# Patient Record
Sex: Female | Born: 1945
Health system: Southern US, Community
[De-identification: ages and names within clinical notes are randomized; demographics above are authoritative.]

## PROBLEM LIST (undated history)

## (undated) DIAGNOSIS — T7840XA Allergy, unspecified, initial encounter: Secondary | ICD-10-CM

## (undated) DIAGNOSIS — G8929 Other chronic pain: Secondary | ICD-10-CM

## (undated) DIAGNOSIS — I428 Other cardiomyopathies: Secondary | ICD-10-CM

## (undated) DIAGNOSIS — G894 Chronic pain syndrome: Secondary | ICD-10-CM

## (undated) DIAGNOSIS — N6320 Unspecified lump in the left breast, unspecified quadrant: Secondary | ICD-10-CM

## (undated) DIAGNOSIS — I5042 Chronic combined systolic (congestive) and diastolic (congestive) heart failure: Secondary | ICD-10-CM

## (undated) DIAGNOSIS — E785 Hyperlipidemia, unspecified: Secondary | ICD-10-CM

## (undated) DIAGNOSIS — R7303 Prediabetes: Secondary | ICD-10-CM

## (undated) DIAGNOSIS — I4819 Other persistent atrial fibrillation: Secondary | ICD-10-CM

## (undated) DIAGNOSIS — M1990 Unspecified osteoarthritis, unspecified site: Secondary | ICD-10-CM

## (undated) DIAGNOSIS — K068 Other specified disorders of gingiva and edentulous alveolar ridge: Secondary | ICD-10-CM

## (undated) DIAGNOSIS — M25551 Pain in right hip: Secondary | ICD-10-CM

## (undated) DIAGNOSIS — I110 Hypertensive heart disease with heart failure: Secondary | ICD-10-CM

## (undated) DIAGNOSIS — K219 Gastro-esophageal reflux disease without esophagitis: Secondary | ICD-10-CM

## (undated) DIAGNOSIS — I10 Essential (primary) hypertension: Secondary | ICD-10-CM

## (undated) DIAGNOSIS — M109 Gout, unspecified: Secondary | ICD-10-CM

## (undated) DIAGNOSIS — B370 Candidal stomatitis: Secondary | ICD-10-CM

## (undated) DIAGNOSIS — Z720 Tobacco use: Secondary | ICD-10-CM

## (undated) DIAGNOSIS — F329 Major depressive disorder, single episode, unspecified: Secondary | ICD-10-CM

## (undated) DIAGNOSIS — Z8739 Personal history of other diseases of the musculoskeletal system and connective tissue: Secondary | ICD-10-CM

## (undated) DIAGNOSIS — M7662 Achilles tendinitis, left leg: Secondary | ICD-10-CM

## (undated) DIAGNOSIS — L301 Dyshidrosis [pompholyx]: Secondary | ICD-10-CM

## (undated) DIAGNOSIS — E559 Vitamin D deficiency, unspecified: Secondary | ICD-10-CM

## (undated) DIAGNOSIS — R432 Parageusia: Secondary | ICD-10-CM

## (undated) DIAGNOSIS — J984 Other disorders of lung: Secondary | ICD-10-CM

## (undated) DIAGNOSIS — M25512 Pain in left shoulder: Secondary | ICD-10-CM

## (undated) HISTORY — DX: Unspecified lump in the left breast, unspecified quadrant: N63.20

## (undated) HISTORY — DX: Pain in right hip: M25.551

## (undated) HISTORY — DX: Other disorders of lung: J98.4

## (undated) HISTORY — DX: Gout, unspecified: M10.9

## (undated) HISTORY — PX: ROTATOR CUFF REPAIR: SHX139

## (undated) HISTORY — DX: Unspecified osteoarthritis, unspecified site: M19.90

## (undated) HISTORY — DX: Hyperlipidemia, unspecified: E78.5

## (undated) HISTORY — DX: Chronic pain syndrome: G89.4

## (undated) HISTORY — DX: Essential (primary) hypertension: I10

## (undated) HISTORY — DX: Allergy, unspecified, initial encounter: T78.40XA

## (undated) HISTORY — PX: HIP FUSION: SHX669

## (undated) HISTORY — DX: Gastro-esophageal reflux disease without esophagitis: K21.9

## (undated) HISTORY — PX: TUBAL LIGATION: SHX77

## (undated) HISTORY — DX: Personal history of other diseases of the musculoskeletal system and connective tissue: Z87.39

## (undated) HISTORY — DX: Other cardiomyopathies: I42.8

## (undated) HISTORY — DX: Other specified disorders of gingiva and edentulous alveolar ridge: K06.8

## (undated) HISTORY — DX: Parageusia: R43.2

## (undated) HISTORY — DX: Other chronic pain: G89.29

## (undated) HISTORY — DX: Hypertensive heart disease with heart failure: I11.0

## (undated) HISTORY — PX: COLONOSCOPY: SHX174

## (undated) HISTORY — DX: Dyshidrosis (pompholyx): L30.1

## (undated) HISTORY — PX: JOINT REPLACEMENT: SHX530

## (undated) HISTORY — DX: Vitamin D deficiency, unspecified: E55.9

## (undated) HISTORY — DX: Chronic combined systolic (congestive) and diastolic (congestive) heart failure: I50.42

## (undated) HISTORY — DX: Achilles tendinitis, left leg: M76.62

## (undated) HISTORY — DX: Tobacco use: Z72.0

## (undated) HISTORY — DX: Major depressive disorder, single episode, unspecified: F32.9

## (undated) HISTORY — DX: Pain in left shoulder: M25.512

## (undated) HISTORY — DX: Candidal stomatitis: B37.0

---

## 1985-10-31 HISTORY — PX: TOTAL HIP ARTHROPLASTY: SHX124

## 1998-04-02 ENCOUNTER — Other Ambulatory Visit: Admission: RE | Admit: 1998-04-02 | Discharge: 1998-04-02 | Payer: Self-pay | Admitting: Family Medicine

## 1998-06-02 ENCOUNTER — Other Ambulatory Visit: Admission: RE | Admit: 1998-06-02 | Discharge: 1998-06-02 | Payer: Self-pay | Admitting: Family Medicine

## 1999-02-10 ENCOUNTER — Encounter: Payer: Self-pay | Admitting: Family Medicine

## 1999-02-10 ENCOUNTER — Ambulatory Visit (HOSPITAL_COMMUNITY): Admission: RE | Admit: 1999-02-10 | Discharge: 1999-02-10 | Payer: Self-pay | Admitting: Family Medicine

## 1999-03-03 ENCOUNTER — Ambulatory Visit (HOSPITAL_COMMUNITY): Admission: RE | Admit: 1999-03-03 | Discharge: 1999-03-03 | Payer: Self-pay | Admitting: Family Medicine

## 1999-12-21 ENCOUNTER — Other Ambulatory Visit: Admission: RE | Admit: 1999-12-21 | Discharge: 1999-12-21 | Payer: Self-pay | Admitting: Family Medicine

## 2000-12-01 ENCOUNTER — Other Ambulatory Visit: Admission: RE | Admit: 2000-12-01 | Discharge: 2000-12-01 | Payer: Self-pay | Admitting: Family Medicine

## 2000-12-08 ENCOUNTER — Encounter: Payer: Self-pay | Admitting: Orthopedic Surgery

## 2000-12-12 ENCOUNTER — Observation Stay (HOSPITAL_COMMUNITY): Admission: RE | Admit: 2000-12-12 | Discharge: 2000-12-13 | Payer: Self-pay | Admitting: Orthopedic Surgery

## 2000-12-12 ENCOUNTER — Encounter: Payer: Self-pay | Admitting: Orthopedic Surgery

## 2003-06-17 ENCOUNTER — Encounter: Admission: RE | Admit: 2003-06-17 | Discharge: 2003-06-17 | Payer: Self-pay | Admitting: Internal Medicine

## 2003-07-30 ENCOUNTER — Encounter: Admission: RE | Admit: 2003-07-30 | Discharge: 2003-07-30 | Payer: Self-pay | Admitting: Internal Medicine

## 2003-11-01 HISTORY — PX: CARDIAC CATHETERIZATION: SHX172

## 2003-12-03 ENCOUNTER — Inpatient Hospital Stay (HOSPITAL_COMMUNITY): Admission: EM | Admit: 2003-12-03 | Discharge: 2003-12-05 | Payer: Self-pay | Admitting: Emergency Medicine

## 2004-03-29 ENCOUNTER — Ambulatory Visit (HOSPITAL_COMMUNITY): Admission: RE | Admit: 2004-03-29 | Discharge: 2004-03-29 | Payer: Self-pay | Admitting: Internal Medicine

## 2004-07-02 ENCOUNTER — Encounter: Admission: RE | Admit: 2004-07-02 | Discharge: 2004-08-17 | Payer: Self-pay | Admitting: Internal Medicine

## 2004-07-07 ENCOUNTER — Ambulatory Visit (HOSPITAL_COMMUNITY): Admission: RE | Admit: 2004-07-07 | Discharge: 2004-07-07 | Payer: Self-pay | Admitting: Family Medicine

## 2004-07-19 ENCOUNTER — Ambulatory Visit: Payer: Self-pay | Admitting: Nurse Practitioner

## 2004-08-06 ENCOUNTER — Ambulatory Visit: Payer: Self-pay | Admitting: *Deleted

## 2004-08-18 ENCOUNTER — Ambulatory Visit: Payer: Self-pay | Admitting: Nurse Practitioner

## 2004-11-29 ENCOUNTER — Ambulatory Visit: Payer: Self-pay | Admitting: Nurse Practitioner

## 2005-07-11 ENCOUNTER — Ambulatory Visit: Payer: Self-pay | Admitting: Nurse Practitioner

## 2006-01-25 ENCOUNTER — Ambulatory Visit: Payer: Self-pay | Admitting: Nurse Practitioner

## 2006-05-31 ENCOUNTER — Ambulatory Visit: Payer: Self-pay | Admitting: Nurse Practitioner

## 2006-07-05 ENCOUNTER — Ambulatory Visit: Payer: Self-pay | Admitting: Nurse Practitioner

## 2006-09-15 ENCOUNTER — Ambulatory Visit: Payer: Self-pay | Admitting: Nurse Practitioner

## 2006-11-10 ENCOUNTER — Ambulatory Visit: Payer: Self-pay | Admitting: Nurse Practitioner

## 2006-11-25 ENCOUNTER — Encounter: Admission: RE | Admit: 2006-11-25 | Discharge: 2006-11-25 | Payer: Self-pay | Admitting: Family Medicine

## 2007-01-12 ENCOUNTER — Encounter: Admission: RE | Admit: 2007-01-12 | Discharge: 2007-01-12 | Payer: Self-pay | Admitting: Orthopedic Surgery

## 2007-02-02 ENCOUNTER — Encounter: Admission: RE | Admit: 2007-02-02 | Discharge: 2007-02-20 | Payer: Self-pay | Admitting: Orthopedic Surgery

## 2007-07-04 ENCOUNTER — Encounter (INDEPENDENT_AMBULATORY_CARE_PROVIDER_SITE_OTHER): Payer: Self-pay | Admitting: Nurse Practitioner

## 2007-07-04 ENCOUNTER — Ambulatory Visit: Payer: Self-pay | Admitting: Family Medicine

## 2007-07-04 LAB — CONVERTED CEMR LAB
ALT: 24 units/L (ref 0–35)
Albumin: 4.9 g/dL (ref 3.5–5.2)
Basophils Absolute: 0 10*3/uL (ref 0.0–0.1)
CO2: 22 meq/L (ref 19–32)
Chloride: 102 meq/L (ref 96–112)
Cholesterol: 280 mg/dL — ABNORMAL HIGH (ref 0–200)
Glucose, Bld: 131 mg/dL — ABNORMAL HIGH (ref 70–99)
HCT: 45.4 % (ref 36.0–46.0)
LDL Cholesterol: 189 mg/dL — ABNORMAL HIGH (ref 0–99)
Lymphocytes Relative: 47 % — ABNORMAL HIGH (ref 12–46)
Lymphs Abs: 2.3 10*3/uL (ref 0.7–3.3)
Neutro Abs: 2.1 10*3/uL (ref 1.7–7.7)
Neutrophils Relative %: 43 % (ref 43–77)
Platelets: 328 10*3/uL (ref 150–400)
Potassium: 4 meq/L (ref 3.5–5.3)
RDW: 14.2 % — ABNORMAL HIGH (ref 11.5–14.0)
Sodium: 139 meq/L (ref 135–145)
Total Bilirubin: 0.4 mg/dL (ref 0.3–1.2)
Total Protein: 8.6 g/dL — ABNORMAL HIGH (ref 6.0–8.3)
Triglycerides: 225 mg/dL — ABNORMAL HIGH (ref <150)
VLDL: 45 mg/dL — ABNORMAL HIGH (ref 0–40)
WBC: 5 10*3/uL (ref 4.0–10.5)

## 2007-07-18 ENCOUNTER — Encounter (INDEPENDENT_AMBULATORY_CARE_PROVIDER_SITE_OTHER): Payer: Self-pay | Admitting: *Deleted

## 2007-07-26 ENCOUNTER — Ambulatory Visit: Payer: Self-pay | Admitting: Internal Medicine

## 2007-07-27 ENCOUNTER — Ambulatory Visit (HOSPITAL_COMMUNITY): Admission: RE | Admit: 2007-07-27 | Discharge: 2007-07-27 | Payer: Self-pay | Admitting: Internal Medicine

## 2007-12-20 ENCOUNTER — Ambulatory Visit: Payer: Self-pay | Admitting: Family Medicine

## 2008-01-10 ENCOUNTER — Encounter (INDEPENDENT_AMBULATORY_CARE_PROVIDER_SITE_OTHER): Payer: Self-pay | Admitting: Nurse Practitioner

## 2008-01-10 ENCOUNTER — Ambulatory Visit: Payer: Self-pay | Admitting: Internal Medicine

## 2008-01-10 LAB — CONVERTED CEMR LAB
AST: 36 units/L (ref 0–37)
Albumin: 4.8 g/dL (ref 3.5–5.2)
Alkaline Phosphatase: 149 units/L — ABNORMAL HIGH (ref 39–117)
BUN: 16 mg/dL (ref 6–23)
Calcium: 9.4 mg/dL (ref 8.4–10.5)
Chloride: 103 meq/L (ref 96–112)
Glucose, Bld: 149 mg/dL — ABNORMAL HIGH (ref 70–99)
LDL Cholesterol: 90 mg/dL (ref 0–99)
Potassium: 4.4 meq/L (ref 3.5–5.3)
Sodium: 140 meq/L (ref 135–145)
Total Protein: 8.3 g/dL (ref 6.0–8.3)

## 2008-03-05 ENCOUNTER — Ambulatory Visit: Payer: Self-pay | Admitting: Internal Medicine

## 2008-04-15 ENCOUNTER — Ambulatory Visit: Payer: Self-pay | Admitting: Family Medicine

## 2008-07-25 ENCOUNTER — Ambulatory Visit: Payer: Self-pay | Admitting: Internal Medicine

## 2008-07-25 ENCOUNTER — Emergency Department (HOSPITAL_COMMUNITY): Admission: EM | Admit: 2008-07-25 | Discharge: 2008-07-25 | Payer: Self-pay | Admitting: Emergency Medicine

## 2009-01-05 ENCOUNTER — Ambulatory Visit: Payer: Self-pay | Admitting: Internal Medicine

## 2009-01-05 ENCOUNTER — Encounter (INDEPENDENT_AMBULATORY_CARE_PROVIDER_SITE_OTHER): Payer: Self-pay | Admitting: Internal Medicine

## 2009-01-05 LAB — CONVERTED CEMR LAB
ALT: 13 U/L
AST: 15 U/L
Albumin: 4.6 g/dL
Alkaline Phosphatase: 141 U/L — ABNORMAL HIGH
BUN: 19 mg/dL
CO2: 19 meq/L
Calcium: 8.9 mg/dL
Chloride: 103 meq/L
Cholesterol: 174 mg/dL
Creatinine, Ser: 0.94 mg/dL
Glucose, Bld: 133 mg/dL — ABNORMAL HIGH
HDL: 49 mg/dL
LDL Cholesterol: 102 mg/dL — ABNORMAL HIGH
Potassium: 4 meq/L
Sodium: 140 meq/L
Total Bilirubin: 0.4 mg/dL
Total CHOL/HDL Ratio: 3.6
Total Protein: 8 g/dL
Triglycerides: 117 mg/dL
VLDL: 23 mg/dL

## 2009-02-10 ENCOUNTER — Ambulatory Visit: Payer: Self-pay | Admitting: Internal Medicine

## 2009-02-16 ENCOUNTER — Ambulatory Visit (HOSPITAL_COMMUNITY): Admission: RE | Admit: 2009-02-16 | Discharge: 2009-02-16 | Payer: Self-pay | Admitting: Obstetrics & Gynecology

## 2010-01-26 ENCOUNTER — Ambulatory Visit: Payer: Self-pay | Admitting: Internal Medicine

## 2010-01-29 ENCOUNTER — Ambulatory Visit: Payer: Self-pay | Admitting: Internal Medicine

## 2010-01-29 LAB — CONVERTED CEMR LAB
AST: 23 units/L (ref 0–37)
Albumin: 4.5 g/dL (ref 3.5–5.2)
BUN: 15 mg/dL (ref 6–23)
CO2: 21 meq/L (ref 19–32)
CRP: 1 mg/dL — ABNORMAL HIGH (ref <0.6)
Calcium: 8.9 mg/dL (ref 8.4–10.5)
Chloride: 97 meq/L (ref 96–112)
Glucose, Bld: 146 mg/dL — ABNORMAL HIGH (ref 70–99)
Hgb A1c MFr Bld: 7.2 % — ABNORMAL HIGH (ref 4.6–6.1)
Potassium: 3.4 meq/L — ABNORMAL LOW (ref 3.5–5.3)
Uric Acid, Serum: 8.8 mg/dL — ABNORMAL HIGH (ref 2.4–7.0)

## 2010-02-26 ENCOUNTER — Ambulatory Visit: Payer: Self-pay | Admitting: Internal Medicine

## 2010-02-26 ENCOUNTER — Encounter (INDEPENDENT_AMBULATORY_CARE_PROVIDER_SITE_OTHER): Payer: Self-pay | Admitting: Internal Medicine

## 2010-03-12 ENCOUNTER — Ambulatory Visit (HOSPITAL_COMMUNITY): Admission: RE | Admit: 2010-03-12 | Discharge: 2010-03-12 | Payer: Self-pay | Admitting: Internal Medicine

## 2010-04-22 ENCOUNTER — Ambulatory Visit: Payer: Self-pay | Admitting: Internal Medicine

## 2010-04-22 LAB — CONVERTED CEMR LAB
BUN: 13 mg/dL (ref 6–23)
Calcium: 9.2 mg/dL (ref 8.4–10.5)
Chloride: 98 meq/L (ref 96–112)
Creatinine, Ser: 0.75 mg/dL (ref 0.40–1.20)
Hgb A1c MFr Bld: 6.8 % — ABNORMAL HIGH (ref <5.7)

## 2010-11-21 ENCOUNTER — Encounter: Payer: Self-pay | Admitting: Family Medicine

## 2011-03-18 NOTE — Discharge Summary (Signed)
NAMEMARLEE, [name]:  [id number]   MEDICAL RECORD NO.:  [id number]                   PATIENT TYPE:  INP   LOCATION:  2036                                 FACILITY:  [facility]   PHYSICIAN:  Melissa L. Ladona Ridgel, MD               DATE OF BIRTH:  1946-02-11   DATE OF ADMISSION:  12/03/2003  DATE OF DISCHARGE:  12/05/2003                                 DISCHARGE SUMMARY   ADMISSION DIAGNOSES:  1. Chest pain.  2. Hypertension.  3. Hypercholesterolemia.   DISCHARGE DIAGNOSIS:  Noncardiac chest pain.   MEDICATIONS AT THE TIME OF DISCHARGE:  1. Prevacid 30 mg once daily.  2. Procardia XL 90 mg once daily.  3. Aspirin 81 mg once daily.  4. Zocor 20 mg once daily.  5. Hydrochlorothiazide 12.5 mg once daily.   PAIN MANAGEMENT:  She was to resume her home medication, the name of which  was not known at the time of admission.  We will give her a limited supply  of Darvocet-N 100.   DIET:  Low-cholesterol, low-salt.   SPECIAL INSTRUCTIONS:  Remove the dressing when showering in the a.m. and no  tub baths x4-5 days.  If you have fever, chills, increased redness or  bleeding of the cath site go to the emergency room and hold pressure over  the site.   FOLLOW UP:  Make an appointment in one week to have her cath site checked  with her primary care physician.   HISTORY OF PRESENT ILLNESS:  The patient is a 65 year old African-American  female with risk factors for coronary artery disease namely; hypertension,  hypercholesterolemia and cigarette smoking.  She also has a positive family  history for coronary artery disease.  The patient presented to the emergency  room with an episode of chest discomfort described as tightness involving  her thenar area and radiating into her feet which was associated with some  shortness of breath, without any nausea, vomiting or diaphoresis.  She has  recently had a mild cough.  The patient was admitted to the telemetry  floor  for rule out of myocardial infarction and cardiac enzymes.  Her EKGs were  unremarkable.  Her enzymes were negative.  She was continued on her aspirin,  Procardia, hydrochlorothiazide and Zocor and tobacco cessation counseling  was offered.  Her chronic back pain was treated with Darvocet.  On December 03, 2003 [facility] was consulted and on December 04, 2003 the patient  went for a Cardiolite stress test.  The patient reacted to her stress  testing with vague complaint of weight on her chest, dyspnea and tightness  that all recovered over the course of two hours.  There was a question of  ischemia in the anterior wall and an ejection fraction of 56% was noted.  There patient was therefore scheduled for a cardiac catheterization and on  December 05, 2003 she underwent said procedure.  Her coronary arteries were  found  to be normal with normal left ventricular function and an EF of 55%  was noted.  The plan was for no further cardiac workup.  She was discharged  to home on her previous medications, instructed on tobacco cessation and was  to followup with her primary care physician in one week to look at her groin  site.   PHYSICAL EXAMINATION:  VITAL SIGNS: On the day of discharge, the patient's  physical exam revealed vital signs were stable.  GENERALLY: She was in no acute distress.  HEENT: Pupils were equal, round and reactive to light.  Extraocular muscles  were intact.  Mucous membranes were moist.  CHEST: Clear to auscultation.  CARDIOVASCULAR: Regular rate and rhythm.  Positive S1 and S2.  No murmurs,  rubs or gallops.  ABDOMEN: Soft and nontender, nondistended with positive bowel sounds.  EXTREMITIES: Showed no edema.   Over the course of the hospitalization the patient required potassium  repletion.  At the time of discharge she was instructed on potassium rich  foods and she was continued on her hydrochlorothiazide, Procardia, aspirin,  and Zocor.  I note the  patient's condition on discharge is stable.                                                Melissa L. Ladona Ridgel, MD    MLT/MEDQ  D:  02/03/2004  T:  02/05/2004  Job:  [id number]   cc:   Rollene Rotunda, M.D.   Denise Score (?), M.D.  [facility]

## 2011-03-18 NOTE — Cardiovascular Report (Signed)
NAMEAMELITA, Donna [name]:  [id number]   MEDICAL RECORD NO.:  [id number]                   PATIENT TYPE:  INP   LOCATION:  2036                                 FACILITY:  [facility]   PHYSICIAN:  Rollene Rotunda, M.D.                DATE OF BIRTH:  21-Nov-1945   DATE OF PROCEDURE:  12/05/2003  DATE OF DISCHARGE:  12/05/2003                              CARDIAC CATHETERIZATION   PRIMARY CARE PHYSICIAN:  Sherin Quarry, MD.   PROCEDURE:  Left heart catheterization, coronary arteriography.   INDICATION:  Evaluate patient with an abnormal Cardiolite suggesting apical  ischemia.  She also had substernal chest discomfort.   PROCEDURE NOTE:  Left heart catheterization was performed via the right  femoral artery.  The artery was cannulated using an anterior wall puncture.  A 6 French arterial sheath was inserted via the modified Seldinger  technique.  Preformed Judkins and a pigtail catheter were utilized.  The  patient tolerated the procedure well and left the lab in stable condition.  Her groin was closed using Angio-Seal device.   RESULTS:   HEMODYNAMICS:  1. LV 119/19.  2. Aortic 119/60.   CORONARIES:  The left main was short and normal.  The LAD was normal.  The  circumflex was normal and gave rise to ramus intermediate and two large  marginals.  The right coronary artery was dominant and normal.   LEFT VENTRICULOGRAM:  Left ventriculogram was obtained in the RAO  projection.  The EF was 55% with normal wall motion.   CONCLUSION:  1. Normal coronaries.  2. Normal left ventricular function.   PLAN:  No further cardiovascular testing is suggested.                                               Rollene Rotunda, M.D.    Derinda Sis  D:  12/05/2003  T:  12/06/2003  Job:  [id number]

## 2011-03-18 NOTE — Consult Note (Signed)
Donna Herrera, GERBINO NO.:  [id number]   MEDICAL RECORD NO.:  [id number]                   PATIENT TYPE:  INP   LOCATION:  2923                                 FACILITY:  [facility]   PHYSICIAN:  Rollene Rotunda, M.D.                DATE OF BIRTH:  12/19/1945   DATE OF CONSULTATION:  12/03/2003  DATE OF DISCHARGE:                                   CONSULTATION   REFERRING PHYSICIAN:  Melissa L. Ladona Ridgel, M.D.   REASON FOR CONSULTATION:  The patient has chest pain.   HISTORY OF PRESENT ILLNESS:  The patient is a 65 year old, African-American  female with no prior cardiac history.  She has had a history of chest  discomfort for over one year.  She says that it has been increasing in  frequency.  Yesterday she had two episodes of chest discomfort, one in the  morning and one in the evening.  They were both at rest.  They lasted for a  few minutes.  She described a cramping feeling with some throat discomfort.  It was not like her previous reflux.  This was moderate in intensity.  There  was no associated nausea, vomiting, or diaphoresis.  There were no  palpitations, presyncope or syncope.  She called [facility] and was told to  come to the emergency room.  She has had no recurrence of chest pain since  that time.   The patient is somewhat limited by orthopaedic problems, but she does get  around in her house doing her housework.  With this she cannot bring on any  chest discomfort and has no exertional symptoms.  She has no shortness of  breath, denies PND or orthopnea.   PAST MEDICAL HISTORY:  1. Hypertension x20 years.  2. Hyperlipidemia x5 years.   PAST SURGICAL HISTORY:  1. 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  2. Rotator cuff surgery.  3. Bilateral knee replacements.  4. Right and left hip surgeries.   ALLERGIES:  MORPHINE.   MEDICATIONS:  1. Hydrochlorothiazide.  2. Prevacid 30 mg q.d.  3. Procardia XL  90 mg q.d.  4. Aspirin.  5. Zocor 20 mg q.h.s., arthritis.   SOCIAL HISTORY:  The patient lives in [place] with her husband.  She has  smoked one half pack a day for 40 years.  She occasionally drinks beer.   FAMILY HISTORY:  Contributory for a mother dying of myocardial infarction at  68.   REVIEW OF SYMPTOMS:  As stated in the history of present illness; positive  for headache, nasal discharge, depression, joint pains, cough, edema, and  reflux.   PHYSICAL EXAMINATION:  GENERAL:  The patient is in no distress.  BLOOD PRESSURE:  102/52.  HEART RATE:  64 and regular.  TEMPERATURE:  Afebrile.  HEENT:  Eyes unremarkable.  Pupils equal, round and reactive to light.  Fundi not visualized.  Oral mucosa unremarkable.  NECK:  No jugular venous distension.  Waveform  within normal limits.  Carotid upstroke brisk and symmetric without bruits or thyromegaly.  Lymphatics without cervical, axillary or inguinal adenopathy.  LUNGS:  Clear to auscultation bilaterally.  BACK:  No costovertebral tenderness.  CHEST:  Unremarkable.  HEART:  PMI is displaced or sustained.  S1 and S2 within normal limits.  No  S3, no S4 and no murmurs.  ABDOMEN:  Obese, positive bowel sounds, normal in frequency, pitch, bruits,  rebound, guarding, no midline pulses, no masses, and no organomegaly.  SKIN:  No rash. __________  EXTREMITIES:  2+ pulses throughout, obvious right versus left leg length  discrepancy.  No edema, no cyanosis, and no clubbing.  No calf tenderness or  swelling.  NEUROLOGIC:  Oriented to person, place and time.  Cranial nerves II-XII  grossly intact.  Motor grossly intact.   Chest x-ray; no acute disease.   EKG; sinus rhythm, rate 69, axis within normal limits, intervals within  normal limits, no acute ST-T wave changes.   LABORATORY DATA:  WBC's 6.2, hemoglobin 13.2, sodium 139, potassium 3.1, BUN  13, creatinine 1, CK 270, MB 7.6, troponin 0.01.   ASSESSMENT AND PLAN:  1. Chest  pain.  Chest discomfort is mostly atypical in nature.  She does     have hypertension and tobacco as a cardiovascular risk factor with family     history as well.  There is no objective evidence of ischemia.  I think     the __________ probability is somewhat low.  I think that a rest/stress     perfusion study would be the safest way to proceed with evaluation.  I     have discussed this with the patient and her family and they agree to     proceed.  2. Tobacco.  We discussed stop smoking.  She is having a stop smoking     consult.  3. Hypertension.  Her blood pressure is currently well controlled and she     will continue medications as listed.  4. Follow-up.  This will be based on the results of the stress test.                                               Rollene Rotunda, M.D.    JH/MEDQ  D:  12/03/2003  T:  12/03/2003  Job:  [id number]   cc:   Melissa L. Ladona Ridgel, MD  [address] [place], [place] [address]  Fax: [phone]

## 2011-03-18 NOTE — H&P (Signed)
NAME:  Donna Herrera, Donna [name]:  [id number]   MEDICAL RECORD NO.:  [id number]                   PATIENT TYPE:  EMS   LOCATION:  MAJO                                 FACILITY:  [facility]   PHYSICIAN:  Jackie Plum, M.D.             DATE OF BIRTH:  Jun 24, 1946   DATE OF ADMISSION:  12/02/2003  DATE OF DISCHARGE:                                HISTORY & PHYSICAL   CHIEF COMPLAINT:  Chest pain.   HISTORY OF PRESENT ILLNESS:  The patient is a 65 year old African American  lady with a history of hypertension and dyslipidemia, who presented today  with one year history of intermittent chest pain.  She came into today  because of an episode of chest pain this evening.  The pain was described as  more of a tightness involving her thenar area which radiates to her foot.  It was associated with some shortness of breath, without any nausea,  vomiting, diaphoresis, fever or chills.  She gives history of mild cough  without any sputum production.  She does not have any palpitations,  nocturnal dyspnea, visual changes, abdominal pain, nausea, vomiting, dysuria  or frequency of micturition.  Pain is said to be worsened by exertion.  It  is relieved by rest.  At the time of the evaluation, the patient's chest  pain had resolved and she was unable to really grade the pain.   PAST MEDICAL HISTORY:  Notable for:  1. Hypertension.  2. Hypercholesterolemia.   MEDICATIONS:  Zocor, Procardia, hydrochlorothiazide, colchicine, Prevacid.   ALLERGIES:  MORPHINE SULFATE.   SOCIAL HISTORY:  She drinks alcohol occasionally but does not use any elicit  drugs.  She smokes cigarettes.   FAMILY HISTORY:  Positive for heart disease.   REVIEW OF SYSTEMS:  No other significant positives or negatives.  See HPI  above; otherwise, unremarkable.   PHYSICAL EXAMINATION:  VITAL SIGNS:  Blood pressure 127/77, pulse 78,  respiratory rate 20, temperature 98.8.  Saturations 99% on room air.  GENERAL:  An obese African American lady lying on a stretcher.  She was not  in acute cardiopulmonary distress.  HEENT:  Normocephalic, atraumatic.  Pupils equal, round and reactive to  light.  Extraocular movements were intact.  She does not have any  conjunctival pallor or icterus.  Oropharynx was moist.  NECK:  Supple, no JVD, no carotid bruits appreciated.  LUNGS:  Clear to auscultation.  CARDIAC:  No rubs or murmurs appreciated.  Heart regular rate and rhythm.  ABDOMEN:  Obese, nontender, bowel sounds were present and normoactive.  EXTREMITIES:  She had 1+ bipedal dependent edema.  NEUROLOGIC:  She is alert and oriented x3.  No acute focality.   STUDIES:  X-ray-no cardiomegaly, no acute infiltrate.   LABORATORY DATA:  CBC was essentially within normal limits.  Initial set of  cardiac enzymes was unremarkable.  Chemistries-sodium 139, potassium 3.1,  chloride 103, SGOT 39, glucose 99, BUN 13, creatinine 1, calcium 9.1, total  protein 7.4, albumin 3.9, AST 24, ALT 21, alkaline phosphatase 25, bilirubin  0.3.   ASSESSMENT:  A 64 year old African American lady with coronary artery  disease, risk factors of age, hypertension, hypercholesterolemia, and  possible family history, who presents with chest pain, cigarette smoking and  probable family history, presented with recurrent intermittent chest pains  over the last one year.  Pain onset during rest and aggravated by activity  along with rest.  Currently, the patient is chest pain-free at the ER at the  moment.  The patient's initial cardiac enzymes and EKGs are unremarkable.   PLAN:  Plan of care is to admit the patient for observation, rule out  protocol be started and the patient will be NPO after midnight today for  possible stress test if she rules out in view of her significant risk  factors.                                                Jackie Plum, M.D.    GO/MEDQ  D:  12/02/2003  T:  12/03/2003  Job:  [id number]

## 2011-03-18 NOTE — Op Note (Signed)
[facility]  Patient:    Donna Herrera, Donna Herrera                    MRN: [id number] Proc. Date: 12/12/00 Attending:  Fayrene Fearing P. Aplington, M.D.                           Operative Report  PREOPERATIVE DIAGNOSIS:   Painful nail plate, left hip.  POSTOPERATIVE DIAGNOSIS:  Painful nail plate, left hip.  OPERATION:  Removal of nail plate, two washers, let hip.  SURGEON:  Illene Labrador. Aplington, M.D.  ASSISTANT:  Dorie Rank, P.A.  ANESTHESIA:  General.  INDICATIONS:  She had this apparatus which I thought was a Smith-Petersen  but turned out to be a Zimmer when we got in the hip.  As a teenager, she had a hip fusion.  The junction of the plate and nail was very prominent digging into the soft tissues causing her considerable pain.  DESCRIPTION OF PROCEDURE:  After satisfactory general anesthesia, in the right decubitus position on the Mark II frame, the left hip was prepped with DuraPrep and draped in the sterile field.  Collier Flowers was employed.  One of the old surgical scars which I used for the surgical incision was excised.  There were two other scars which were not involved.  Incision was carried down to the prominent nail plate junction and once I demarcated that I then began chipping away bone from around it and going distally along the plate.  There were two washers which were loose, and one was near the nail plate junction.  This was easily found and removed.  There were originally four screws in the plate. The proximal three had heads still on them, and the fourth one was headless with a washer at this location as well.  I was not able to initially find the washer.  I used the C-arm to locate it in the soft tissues and then removed it.  I then fortunately was able to remove the cruciate screws from the three that had heads.  I did not try and damage the femur any further to remove the fourth one since it was not causing any problems, and I felt that  the treatment would be potentially worse than the problem.  I was able to leave off the plate which left the prominent Smith-petersen type nail.  We were unable to find screwdriver to fit in the head and consequently I began chipping away the nail with osteotome and rongeur until it was loose enough that by placing vise-grip pliers on the end of the screw, I was able to drive the nail out retrograde.  I then placed whatever bone chips we had remaining from the surgical approach into the canal.  The wound was then irrigated with sterile saline.  She was given 30 mg of Toradol IV, and soft tissues were irrigated with 0.5% plain Marcaine.  The wound was then closed in the deep layers with interrupted #1 Vicryl, superficial with 2-0 Vicryl and the skin with staples.  Dry sterile dressing was applied.  She was rolled over off the operating bed and taken to the recovery room in satisfactory condition with no known complications. DD:  12/12/00 TD:  12/12/00 Job: [id number]

## 2011-03-31 ENCOUNTER — Ambulatory Visit: Payer: Medicare Other

## 2011-04-04 ENCOUNTER — Ambulatory Visit: Payer: Medicare Other | Attending: Family Medicine | Admitting: Rehabilitative and Restorative Service Providers"

## 2011-04-04 DIAGNOSIS — IMO0001 Reserved for inherently not codable concepts without codable children: Secondary | ICD-10-CM | POA: Insufficient documentation

## 2011-04-04 DIAGNOSIS — R293 Abnormal posture: Secondary | ICD-10-CM | POA: Insufficient documentation

## 2011-04-04 DIAGNOSIS — M6281 Muscle weakness (generalized): Secondary | ICD-10-CM | POA: Insufficient documentation

## 2011-04-04 DIAGNOSIS — M25619 Stiffness of unspecified shoulder, not elsewhere classified: Secondary | ICD-10-CM | POA: Insufficient documentation

## 2011-04-19 ENCOUNTER — Encounter: Payer: Medicare Other | Admitting: Rehabilitative and Restorative Service Providers"

## 2011-05-02 ENCOUNTER — Ambulatory Visit: Payer: Medicare Other | Admitting: Rehabilitative and Restorative Service Providers"

## 2012-10-12 ENCOUNTER — Ambulatory Visit (INDEPENDENT_AMBULATORY_CARE_PROVIDER_SITE_OTHER): Payer: Medicare Other | Admitting: Family Medicine

## 2012-10-12 ENCOUNTER — Encounter: Payer: Self-pay | Admitting: Family Medicine

## 2012-10-12 VITALS — BP 123/73 | HR 72 | Temp 98.0°F | Ht 67.0 in | Wt 173.1 lb

## 2012-10-12 DIAGNOSIS — M1990 Unspecified osteoarthritis, unspecified site: Secondary | ICD-10-CM | POA: Insufficient documentation

## 2012-10-12 DIAGNOSIS — I10 Essential (primary) hypertension: Secondary | ICD-10-CM

## 2012-10-12 DIAGNOSIS — M109 Gout, unspecified: Secondary | ICD-10-CM

## 2012-10-12 DIAGNOSIS — I110 Hypertensive heart disease with heart failure: Secondary | ICD-10-CM | POA: Insufficient documentation

## 2012-10-12 DIAGNOSIS — G8929 Other chronic pain: Secondary | ICD-10-CM | POA: Insufficient documentation

## 2012-10-12 DIAGNOSIS — I509 Heart failure, unspecified: Secondary | ICD-10-CM | POA: Insufficient documentation

## 2012-10-12 DIAGNOSIS — K219 Gastro-esophageal reflux disease without esophagitis: Secondary | ICD-10-CM | POA: Insufficient documentation

## 2012-10-12 DIAGNOSIS — J309 Allergic rhinitis, unspecified: Secondary | ICD-10-CM | POA: Insufficient documentation

## 2012-10-12 HISTORY — DX: Unspecified osteoarthritis, unspecified site: M19.90

## 2012-10-12 HISTORY — DX: Hypertensive heart disease with heart failure: I11.0

## 2012-10-12 LAB — CBC
HCT: 40.5 % (ref 36.0–46.0)
MCV: 85.3 fL (ref 78.0–100.0)
RBC: 4.75 MIL/uL (ref 3.87–5.11)
WBC: 4.5 10*3/uL (ref 4.0–10.5)

## 2012-10-12 LAB — COMPREHENSIVE METABOLIC PANEL
ALT: 18 U/L (ref 0–35)
CO2: 26 mEq/L (ref 19–32)
Creat: 0.62 mg/dL (ref 0.50–1.10)
Glucose, Bld: 107 mg/dL — ABNORMAL HIGH (ref 70–99)
Total Bilirubin: 0.4 mg/dL (ref 0.3–1.2)

## 2012-10-12 LAB — POCT GLYCOSYLATED HEMOGLOBIN (HGB A1C): Hemoglobin A1C: 6.3

## 2012-10-12 LAB — URIC ACID: Uric Acid, Serum: 8.6 mg/dL — ABNORMAL HIGH (ref 2.4–7.0)

## 2012-10-12 MED ORDER — METOPROLOL SUCCINATE ER 50 MG PO TB24: 50.0000 mg | ORAL_TABLET | Freq: Every day | ORAL | Status: DC

## 2012-10-12 MED ORDER — COLCHICINE 0.6 MG PO TABS: 0.6000 mg | ORAL_TABLET | Freq: Every day | ORAL | Status: DC

## 2012-10-12 MED ORDER — AMLODIPINE BESYLATE 5 MG PO TABS: 5.0000 mg | ORAL_TABLET | Freq: Every day | ORAL | Status: DC

## 2012-10-12 MED ORDER — HYDROCODONE-ACETAMINOPHEN 5-325 MG PO TABS: 1.0000 | ORAL_TABLET | Freq: Four times a day (QID) | ORAL | Status: DC | PRN

## 2012-10-12 NOTE — Assessment & Plan Note (Signed)
Acute attack is improving with colchicine. Discussed prophylactic therapy but she states that last flare was 10 yrs ago so this might not be indicated. Will check uric acid today. Refill colchicine. Might be able to take NSAIDs pending renal function and records (to rule out PUD). F/u in 3-4 weeks.

## 2012-10-12 NOTE — Patient Instructions (Addendum)
Nice to meet you. Would recommend continuing colchicine for now. Stop the hydrochlorothiazide-this can worsen gout. Start taking metoprolol for BP instead. If kidney function is good and you don't have stomach problems like ulcers, then motrin/advil or aleve can help. Please make appointment for check up in one month to check your pain and blood pressure again. Avoid alcohol, red meats, extra protein which can trigger gout.   Gout Gout is caused by a buildup of uric acid crystals in the joints. The crystals make your joints sore. This is like having sand in your joints. Repeat attacks are common. Gout can be treated. HOME CARE   Do not take aspirin for pain.  Only take medicine as told by your doctor.  You may use cold treatments (ice) on painful joints.  Put ice in a plastic bag.  Place a towel between your skin and the bag.  Leave the ice on for 15 to 20 minutes at a time, 3 to 4 times a day.  Rest in bed as much as possible. When in bed, keep the sheets and blankets off your sore joints.  Keep the sore joints raised (elevated).  Use crutches if your legs or ankles hurt.  Drink enough water and fluids to keep your pee (urine) clear or pale yellow. This helps your body get rid of uric acid. Do not drink alcohol.  Follow diet instructions as told by your doctor.  Keep your body at a healthy weight. GET HELP RIGHT AWAY IF:   You have a temperature by mouth above 102 F (38.9 C), not controlled by medicine.  You have watery poop (diarrhea).  You are throwing up (vomiting).  You do not feel better in 1 day, or you are getting worse.  Your joint hurts more.  You have the chills. MAKE SURE YOU:   Understand these instructions.  Will watch your condition.  Will get help right away if you are not doing well or get worse. Document Released: 07/26/2008 Document Revised: 01/09/2012 Document Reviewed: 01/25/2010 [facility] Patient Information 2013 [place], [place].

## 2012-10-12 NOTE — Assessment & Plan Note (Signed)
At goal today. We discussed HCTZ as possible risk for recurrent gout. Will discontinue this and start metoprolol XL 50 mg to continue with nifedipine. F/u in 3-4 weeks. Check labs and renal function today.

## 2012-10-12 NOTE — Progress Notes (Signed)
Subjective:    Patient ID: Donna Herrera, female    DOB: August 15, 1946, 66 y.o.   MRN: [id number]  HPI New patient to establish care from [facility].  1. Chronic pain/DJD. Has extensive hx of ortho surgeries including right hip replacement, bilateral knee replacements, bilateral rotator cuff repair and left hip fusion from reported SCFE as a younger adult. She has done PT before but not helpful. She is relatively stable from pain perspective, but "just wants to get through the holidays." previously was taking celebrex but cannot afford. Uses vicodin currently. Denies falls, swelling, redness, numbness.  2. Right toe pain. Previously dx with gout, last attack was about 10-15 yrs ago. Current pain started 10 days ago and improving. She is taking colchicine which she had from previous flare (rx has 05/2012 on it). She asks what other medications can be used that are cheaper. She denies hx of renal issues or PUD (but is prescribed carafate). Does not endorse recent ETOH, meat binge.  3. HTN. Taking HCTZ and nifedipine for years. Denies edema, chest pain, dyspnea, med side effects.   Past Medical History  Diagnosis Date  . Allergy   . Arthritis   . Hypertension   . GERD (gastroesophageal reflux disease)   . Chronic pain   . Hyperlipidemia   . Gout   . H/O slipped capital femoral epiphysis (SCFE)    Past Surgical History  Procedure Date  . Cesarean section   . Tubal ligation   . Total hip arthroplasty 1987    right. Dr. Leslee Home  . Joint replacement 1991, 1997    [place]  . Hip fusion     left   . Rotator cuff repair     bilateral  . Cardiac catheterization 2005    normal-Hochrein   Family History  Problem Relation Age of Onset  . Depression Mother   . Diabetes Mother   . Hypertension Mother   . Stroke Mother   . Heart disease Mother   . Cancer Father     unknown primary  . Diabetes Sister    History   Social History  . Marital Status: Married    Spouse Name: N/A     Number of Children: N/A  . Years of Education: N/A   Occupational History  . Not on file.   Social History Main Topics  . Smoking status: Current Every Day Smoker -- 0.5 packs/day  . Smokeless tobacco: Not on file  . Alcohol Use: Yes     Comment: on holidays  . Drug Use: No  . Sexually Active: Not on file   Other Topics Concern  . Not on file   Social History Narrative   Formerly daycare provider. Lives with husband, daughter Donna Herrera and grandkids.    Review of Systems See HPI otherwise negative.  reports that she has been smoking.  She does not have any smokeless tobacco history on file.     Objective:   Physical Exam  Vitals reviewed. Constitutional: She is oriented to person, place, and time. She appears well-developed and well-nourished. No distress.       Appears older than stated.  HENT:  Head: Normocephalic and atraumatic.  Nose: Nose normal.  Mouth/Throat: Oropharynx is clear and moist. No oropharyngeal exudate.  Eyes: EOM are normal. Pupils are equal, round, and reactive to light.  Neck: Neck supple. No thyromegaly present.  Cardiovascular: Normal rate, regular rhythm and normal heart sounds.   No murmur heard. Pulmonary/Chest: Effort normal and breath sounds normal.  No respiratory distress. She has no wheezes. She has no rales.  Abdominal: Soft.  Musculoskeletal: She exhibits no edema and no tenderness.       Right large toe some TTP in DIP joint. ROM intact. No warmth or edema. No spinous TTP. Left shoulder active and passive ROM limited to 90 degree of abduction. Major crepitus.  Right shoulder ROM intact.  Lymphadenopathy:    She has no cervical adenopathy.  Neurological: She is alert and oriented to person, place, and time. No cranial nerve deficit. Coordination normal.       Gait somewhat antalgic, with a cane.  Skin: No rash noted. She is not diaphoretic.  Psychiatric: She has a normal mood and affect.       Assessment & Plan:

## 2012-10-12 NOTE — Assessment & Plan Note (Addendum)
Severe chronic pain and limited functioning. Discussed pain medication options. NSAID might be a good option, but she does not give a good history for reason she takes carafate (would like to avoid if she actually has PUD). Refuses refill for celebrex due to expense. Will attempt to get records from [facility] (she states did not pick up packet prior to them closing). Have refilled short term vicodin for now. F/u in 3-4 weeks.

## 2012-10-15 ENCOUNTER — Other Ambulatory Visit: Payer: Self-pay | Admitting: Family Medicine

## 2012-10-15 DIAGNOSIS — E559 Vitamin D deficiency, unspecified: Secondary | ICD-10-CM

## 2012-10-15 MED ORDER — POTASSIUM CHLORIDE 20 MEQ PO PACK: 20.0000 meq | PACK | Freq: Every day | ORAL | Status: DC

## 2012-10-15 MED ORDER — ERGOCALCIFEROL 1.25 MG (50000 UT) PO CAPS: 50000.0000 [IU] | ORAL_CAPSULE | ORAL | Status: DC

## 2012-10-16 ENCOUNTER — Telehealth: Payer: Self-pay | Admitting: Family Medicine

## 2012-10-16 NOTE — Telephone Encounter (Signed)
Advised pt of Dr Ernest Haber rec [id number] and the need for labs in 1 mo

## 2012-11-02 ENCOUNTER — Encounter: Payer: Self-pay | Admitting: Family Medicine

## 2012-11-02 ENCOUNTER — Ambulatory Visit (HOSPITAL_COMMUNITY)
Admission: RE | Admit: 2012-11-02 | Discharge: 2012-11-02 | Disposition: A | Discharge status: Home / Self Care | Payer: Medicare Other | Source: Ambulatory Visit | Attending: Family Medicine | Admitting: Family Medicine

## 2012-11-02 ENCOUNTER — Ambulatory Visit (INDEPENDENT_AMBULATORY_CARE_PROVIDER_SITE_OTHER): Payer: Medicare Other | Admitting: Family Medicine

## 2012-11-02 VITALS — BP 120/74 | HR 89 | Temp 98.1°F | Ht 67.0 in | Wt 174.3 lb

## 2012-11-02 DIAGNOSIS — M25551 Pain in right hip: Secondary | ICD-10-CM | POA: Insufficient documentation

## 2012-11-02 DIAGNOSIS — M25559 Pain in unspecified hip: Secondary | ICD-10-CM

## 2012-11-02 HISTORY — DX: Pain in right hip: M25.551

## 2012-11-02 MED ORDER — HYDROCODONE-ACETAMINOPHEN 5-300 MG PO TABS: 1.0000 | ORAL_TABLET | Freq: Three times a day (TID) | ORAL | Status: DC | PRN

## 2012-11-02 NOTE — Patient Instructions (Addendum)
Your hip may have some damage from arthritis or previous surgery. Get xrays done. Will make orthopedics referral. If you dont hear about appointment in next week, call clinic to remind [name]. Caution with vicodin can cause sleepiness. Make appointment for check up in 2 weeks.

## 2012-11-02 NOTE — Assessment & Plan Note (Addendum)
Acute worsening. No red flags or injury/fall reported. Suspect some degeneration of artifical joint is recurring. Plain film reviewed shows no acute changes but stable degeneration, spurring and stable possible subluxation. Will make orthopedic referral for further evaluation. Refill vicodin for short term. Advised to f/u for worsening or if trouble walking, make appt in 2 weeks.

## 2012-11-02 NOTE — Progress Notes (Signed)
  Subjective:    Patient ID: Donna Herrera, female    DOB: Aug 03, 1946, 67 y.o.   MRN: [id number]  HPI  1. Right hip pain. Worsened past 3 weeks. In the anterior and posterior aspect. No triggering factor known. Had hip arthroplasty by Dr. Leslee Home in the 1980s. In 2009, had similar worsening of symptoms and had some debris in the joint on film. She did not f/u and hasn't seen orthopedist in years. Ran out of vicodin she normally took for pain (previously rx at [facility]).   Review of Systems Denies any falls, other joint exacerbations, fever, calf/knee pain, worsened back pain, leg numbness, incontinence.      Objective:   Physical Exam  Vitals reviewed. Constitutional: She is oriented to person, place, and time. She appears well-developed and well-nourished. No distress.  HENT:  Head: Normocephalic and atraumatic.  Pulmonary/Chest: Effort normal.  Musculoskeletal:       Stands with cane. Gait is antalgic. Right hip has TTP in anterior aspect and in SI region. Very slow for active ROM and resists any passive ROM.  No skin changes or oozing noted.  Neurological: She is alert and oriented to person, place, and time.  Skin: No rash noted.          Assessment & Plan:

## 2012-11-12 ENCOUNTER — Encounter: Payer: Self-pay | Admitting: Family Medicine

## 2012-11-21 ENCOUNTER — Ambulatory Visit: Payer: Medicare Other | Admitting: Family Medicine

## 2012-11-28 ENCOUNTER — Ambulatory Visit: Payer: Medicare Other | Admitting: Family Medicine

## 2013-01-02 ENCOUNTER — Encounter: Payer: Self-pay | Admitting: Family Medicine

## 2013-01-02 ENCOUNTER — Ambulatory Visit (INDEPENDENT_AMBULATORY_CARE_PROVIDER_SITE_OTHER): Payer: Medicare Other | Admitting: Family Medicine

## 2013-01-02 VITALS — BP 102/75 | HR 77 | Temp 98.6°F | Ht 67.0 in | Wt 179.0 lb

## 2013-01-02 DIAGNOSIS — R51 Headache: Secondary | ICD-10-CM

## 2013-01-02 DIAGNOSIS — R519 Headache, unspecified: Secondary | ICD-10-CM | POA: Insufficient documentation

## 2013-01-02 DIAGNOSIS — M25551 Pain in right hip: Secondary | ICD-10-CM

## 2013-01-02 DIAGNOSIS — M25559 Pain in unspecified hip: Secondary | ICD-10-CM

## 2013-01-02 DIAGNOSIS — M1990 Unspecified osteoarthritis, unspecified site: Secondary | ICD-10-CM

## 2013-01-02 DIAGNOSIS — I10 Essential (primary) hypertension: Secondary | ICD-10-CM

## 2013-01-02 LAB — COMPREHENSIVE METABOLIC PANEL
Alkaline Phosphatase: 104 U/L (ref 39–117)
BUN: 15 mg/dL (ref 6–23)
Chloride: 104 mEq/L (ref 96–112)
Creat: 0.82 mg/dL (ref 0.50–1.10)
Glucose, Bld: 124 mg/dL — ABNORMAL HIGH (ref 70–99)
Potassium: 3.6 mEq/L (ref 3.5–5.3)
Total Protein: 7.3 g/dL (ref 6.0–8.3)

## 2013-01-02 LAB — CBC
MCH: 28.2 pg (ref 26.0–34.0)
MCHC: 32.9 g/dL (ref 30.0–36.0)
MCV: 85.6 fL (ref 78.0–100.0)
Platelets: 280 10*3/uL (ref 150–400)
RDW: 14.2 % (ref 11.5–15.5)
WBC: 5.5 10*3/uL (ref 4.0–10.5)

## 2013-01-02 MED ORDER — DESONIDE 0.05 % EX CREA: 1.0000 "application " | TOPICAL_CREAM | Freq: Every day | CUTANEOUS | Status: DC | PRN

## 2013-01-02 MED ORDER — HYDROCODONE-ACETAMINOPHEN 5-300 MG PO TABS: 1.0000 | ORAL_TABLET | Freq: Every day | ORAL | Status: DC | PRN

## 2013-01-02 MED ORDER — AMOXICILLIN 500 MG PO CAPS: 500.0000 mg | ORAL_CAPSULE | Freq: Three times a day (TID) | ORAL | Status: DC

## 2013-01-02 NOTE — Assessment & Plan Note (Signed)
Low today. Will discontinue HCTZ and she is not taking nifedipine, will continue on without this as well. F/u in 1-2 weeks for recheck. Labs today.

## 2013-01-02 NOTE — Patient Instructions (Addendum)
Stop taking hydrochlorothiazide and nifedipine. Keep taking metoprolol 50 mg daily. Pick up refills of vitamin D.  Pick up amoxicillin and start using nasal saline spray (buy over the counter). Make appointment in one week or sooner if needed.

## 2013-01-02 NOTE — Progress Notes (Signed)
  Subjective:    Patient ID: Donna Herrera, female    DOB: 12-01-1945, 67 y.o.   MRN: [id number]  Hypertension    1. HTN. Patient confused on instruction for meds last visit. She is still taking HCTZ and stopped the nifedipine. Did start metoprolol. Does not check at home. States she had few days of lightheadedness last week. Now c/o headache recently. Her gout symptoms are resolved.  Denies syncope, edema, abdominal pain, visual decline, falls.  2. Headache. States about 2 weeks ago she had start of runny nose, congestion. Currently still having some right throat irritation, right facial congestion and right sided frontal headache that comes and goes. She also has some right neck tenderness.  Denies fever, chills, fatigue, rash, visual changes, weight loss, dyspnea, cough.  3. Chronic pain/OA. Currently her hip is feeling better after some resting, she notices some right shoulder pain/stiffness that is longstanding. She was not able to f/u with ortho because of outstanding bill. Denies any worsening, rash, falls, injury. She ran out of medication and is using OTC aleve and tylenol but is not helping. She requests refill of hydrocodone.  Hx of hip surgery many years ago.  Review of Systems See HPI otherwise negative.  reports that she has been smoking.  She does not have any smokeless tobacco history on file.     Objective:   Physical Exam  Vitals reviewed. Constitutional: She is oriented to person, place, and time. She appears well-developed and well-nourished. No distress.  HENT:  Head: Normocephalic and atraumatic.  Mouth/Throat: Oropharynx is clear and moist.  Right maxillary, temporal and frontal TTP. No rash. Right paraspinal musculature is TTP. Slight pharyngeal erythema, no exudate.  No pain with jaw ROM or in TMJ  Eyes: EOM are normal. Pupils are equal, round, and reactive to light.  Neck: Normal range of motion. Neck supple. No thyromegaly present.  Cardiovascular:  Normal rate, regular rhythm and normal heart sounds.   Pulmonary/Chest: Effort normal and breath sounds normal. No respiratory distress. She has no wheezes.  Lymphadenopathy:    She has no cervical adenopathy.  Neurological: She is alert and oriented to person, place, and time.  Skin: She is not diaphoretic.  Psychiatric: She has a normal mood and affect.       Assessment & Plan:

## 2013-01-02 NOTE — Assessment & Plan Note (Signed)
Most likely this comes after a viral URI with some post nasal drip and sinusitis to explain the headache and sore throat. No systemic signs or visual symptoms to suggest more serious such as temporal arteritis, but this crossed my mind since she does have some associated temporal tenderness. Will check inflammatory markers to rule out. Treat for sinusitis with nasal saline and amoxicillin. F/u in one week.

## 2013-01-02 NOTE — Assessment & Plan Note (Signed)
Chronic arthritis and associated pain. Would prefer if she not take scheduled narcotics. Ok to use prn vicodin which i will send to pharmacy as needed. Will have her sign medication contract next visit. F/u in 1-2 weeks.

## 2013-01-03 ENCOUNTER — Other Ambulatory Visit: Payer: Self-pay | Admitting: Family Medicine

## 2013-01-03 ENCOUNTER — Telehealth: Payer: Self-pay | Admitting: *Deleted

## 2013-01-03 NOTE — Telephone Encounter (Signed)
Message copied by Farrell Ours on Thu Jan 03, 2013  8:44 AM ------      Message from: Durwin Reges      Created: Wed Jan 02, 2013  4:52 PM       Please call in the hydrocodone. I didn't print during her visit today. ------

## 2013-01-03 NOTE — Telephone Encounter (Signed)
Called in hydrocodone to [facility] [address]. Spoke with patient and notified her of this

## 2013-01-13 ENCOUNTER — Other Ambulatory Visit: Payer: Self-pay | Admitting: Family Medicine

## 2013-02-06 ENCOUNTER — Ambulatory Visit (INDEPENDENT_AMBULATORY_CARE_PROVIDER_SITE_OTHER): Payer: Medicare Other | Admitting: Family Medicine

## 2013-02-06 ENCOUNTER — Encounter: Payer: Self-pay | Admitting: Family Medicine

## 2013-02-06 VITALS — BP 134/72 | HR 68 | Temp 98.0°F | Ht 67.0 in | Wt 180.0 lb

## 2013-02-06 DIAGNOSIS — G8929 Other chronic pain: Secondary | ICD-10-CM

## 2013-02-06 DIAGNOSIS — R51 Headache: Secondary | ICD-10-CM

## 2013-02-06 DIAGNOSIS — M25551 Pain in right hip: Secondary | ICD-10-CM

## 2013-02-06 DIAGNOSIS — M25559 Pain in unspecified hip: Secondary | ICD-10-CM

## 2013-02-06 MED ORDER — HYDROCODONE-ACETAMINOPHEN 5-300 MG PO TABS: 1.0000 | ORAL_TABLET | Freq: Every day | ORAL | Status: DC | PRN

## 2013-02-06 NOTE — Progress Notes (Signed)
  Subjective:    Patient ID: Donna Herrera, female    DOB: [birth year], 67 y.o.   MRN: [id number]  Hip Pain     1. F/u sinusitis/headaches. Completed treatment with antibiotic one month ago. Her rhinorrhea and nasal discharge has resolved. She still intermittently feels some pain near her left eye and generalized headaches. This seems to have worsened over the past several weeks. She currently takes Aleve on a daily basis and she ran out of Vicodin for her hip arthritis. She also takes aspirin and Tylenol at times. The headache does feel throbbing. No current headache. This happens several times weekly. Does resolve with analgesic.  There is no loss of vision, fever, chills, rash, swelling, weakness, weight loss, night sweats, sinus pain, cough, ear pain, hearing loss. Patient has gone without eye exam for approximately 5-10 years. Last seen by Dr. Harriette Bouillon in [place].  2. Chronic hip/shoulder pain. Patient ran out of Vicodin one to 2 weeks ago. Experiencing worsened chronic right hip pain. Is using multiple doses of OTC analgesics. Previously she was prescribed Vicodin for 90 pills to last for 2-3 months while at [facility].  The patient states she cannot afford a hip replacement, therefore has not followed up with her orthopedist. She also has a history of bilateral rotator cuff repairs. These are giving her chronic pain for several years, this is not new. There is no extra stiffness noted.  Plain films of the hip showed stable post right arthroplasty changes. Additionally her inflammatory markers were negative.  Review of Systems See HPI otherwise negative.  reports that she has been smoking.  She does not have any smokeless tobacco history on file.     Objective:   Physical Exam  Vitals reviewed. Constitutional: She is oriented to person, place, and time. She appears well-developed and well-nourished. No distress.  HENT:  Head: Normocephalic and atraumatic.  Right Ear: External  ear normal.  Left Ear: External ear normal.  Nose: Nose normal.  Mouth/Throat: Oropharynx is clear and moist. No oropharyngeal exudate.  Poor dentition. A right maxillary molar appears with darkened gingival rim and chipped appearance. Additionally, there are several other similar teeth with significant decay. There is no erythema, abscess, pus or malodor noted.  No tempporal tenderness to palpation. No sinus tenderness to palpation. No abnormality on fundic exam.  Eyes: Conjunctivae and EOM are normal. Pupils are equal, round, and reactive to light.  Neck: Neck supple. No thyromegaly present.  Cardiovascular: Normal rate, regular rhythm and normal heart sounds.   No murmur heard. Pulmonary/Chest: Effort normal. No respiratory distress.  Abdominal: Soft.  Musculoskeletal: She exhibits edema.  Weight tenderness to palpation in bilateral posterior shoulders. Active range of motion is intact.  Gait is antalgic with a cane.  Lymphadenopathy:    She has no cervical adenopathy.  Neurological: She is alert and oriented to person, place, and time.  Skin: No rash noted. She is not diaphoretic.  Psychiatric: She has a normal mood and affect.       Assessment & Plan:

## 2013-02-06 NOTE — Patient Instructions (Signed)
Your facial pain maybe from tooth impaction. No sign of sinus infection. See if your insurance can cover a dentist. Avoid taking aleve/motrin everyday because that can make headache worse. We need to get you into an eye doctor for check up, since headache is near eye. Make an appointment in 1-2 months for check up.

## 2013-02-06 NOTE — Assessment & Plan Note (Addendum)
Chronic hip and shoulder pain, no plans for definitive hip or placement surgery at this time do to financial barriers. Does not seem to be consistent with PMR or other inflammatory disease given her history of multiple surgeries, chronicity, and negative inflammatory markers. Patient is unable to followup with Dr. Leslee Herrera. I have refilled her Vicodin to use when necessary daily. Advised she avoid long-term OTC NSAIDs. F/u in 1-2 months.

## 2013-02-06 NOTE — Assessment & Plan Note (Signed)
This persists after a treatment for sinus infection, however no sign of infection is present today. Most likely this represents medication overuse headache with her frequent analgesic use. Could also be rebound migraine. There are no red flags on exam today. She is overdue for ophthalmologic exam, so I recommend she has this done to rule out any optic conditions also. Her inflammatory markers were negative and has no systemic signs of inflammatory making this less likely. Advised patient to cut back on NSAIDs and OTC analgesia.

## 2013-02-12 ENCOUNTER — Other Ambulatory Visit: Payer: Self-pay | Admitting: Family Medicine

## 2013-02-22 ENCOUNTER — Other Ambulatory Visit: Payer: Self-pay | Admitting: *Deleted

## 2013-02-22 MED ORDER — ERGOCALCIFEROL 1.25 MG (50000 UT) PO CAPS: 50000.0000 [IU] | ORAL_CAPSULE | ORAL | Status: DC

## 2013-04-10 ENCOUNTER — Encounter: Payer: Self-pay | Admitting: Family Medicine

## 2013-04-10 ENCOUNTER — Ambulatory Visit (INDEPENDENT_AMBULATORY_CARE_PROVIDER_SITE_OTHER): Payer: Medicare Other | Admitting: Family Medicine

## 2013-04-10 ENCOUNTER — Other Ambulatory Visit: Payer: Self-pay | Admitting: *Deleted

## 2013-04-10 VITALS — BP 158/84

## 2013-04-10 DIAGNOSIS — M25512 Pain in left shoulder: Secondary | ICD-10-CM

## 2013-04-10 DIAGNOSIS — I10 Essential (primary) hypertension: Secondary | ICD-10-CM

## 2013-04-10 DIAGNOSIS — M25551 Pain in right hip: Secondary | ICD-10-CM

## 2013-04-10 DIAGNOSIS — G8929 Other chronic pain: Secondary | ICD-10-CM

## 2013-04-10 DIAGNOSIS — M25519 Pain in unspecified shoulder: Secondary | ICD-10-CM

## 2013-04-10 DIAGNOSIS — K219 Gastro-esophageal reflux disease without esophagitis: Secondary | ICD-10-CM

## 2013-04-10 DIAGNOSIS — M25559 Pain in unspecified hip: Secondary | ICD-10-CM

## 2013-04-10 HISTORY — DX: Other chronic pain: G89.29

## 2013-04-10 MED ORDER — PANTOPRAZOLE SODIUM 40 MG PO TBEC: 40.0000 mg | DELAYED_RELEASE_TABLET | Freq: Every day | ORAL | Status: DC

## 2013-04-10 MED ORDER — HYDROCODONE-ACETAMINOPHEN 5-300 MG PO TABS: 1.0000 | ORAL_TABLET | Freq: Every day | ORAL | Status: DC | PRN

## 2013-04-10 MED ORDER — COLCHICINE 0.6 MG PO TABS: 0.6000 mg | ORAL_TABLET | Freq: Every day | ORAL | Status: DC | PRN

## 2013-04-10 NOTE — Assessment & Plan Note (Signed)
Slightly elevated today which is atypical. Her dose is slightly late due to appt today. Will not make adjustment. F/u in 1-2 months.

## 2013-04-10 NOTE — Patient Instructions (Addendum)
You may have some rotator cuff problems. Important to do your shoulder exercises every day. You could call orthopedist again for more options, or if you want physical therapy then let us know.  You can call [facility] for your colonoscopy.  If your acid reflux gets worse, then let us know. We may need to do more testing. Make an appointment in 2 months for check up.   Shoulder Exercises EXERCISES  RANGE OF MOTION (ROM) AND STRETCHING EXERCISES These exercises may help you when beginning to rehabilitate your injury. Your symptoms may resolve with or without further involvement from your physician, physical therapist or athletic trainer. While completing these exercises, remember:   Restoring tissue flexibility helps normal motion to return to the joints. This allows healthier, less painful movement and activity.  An effective stretch should be held for at least 30 seconds.  A stretch should never be painful. You should only feel a gentle lengthening or release in the stretched tissue.   ROM - Pendulum  Bend at the waist so that your right / left arm falls away from your body. Support yourself with your opposite hand on a solid surface, such as a table or a countertop.  Your right / left arm should be perpendicular to the ground. If it is not perpendicular, you need to lean over farther. Relax the muscles in your right / left arm and shoulder as much as possible.  Gently sway your hips and trunk so they move your right / left arm without any use of your right / left shoulder muscles.  Progress your movements so that your right / left arm moves side to side, then forward and backward, and finally, both clockwise and counterclockwise.  Complete __________ repetitions in each direction. Many people use this exercise to relieve discomfort in their shoulder as well as to gain range of motion. Repeat __________ times. Complete this exercise __________ times per day. STRETCH  Flexion,  Standing  Stand with good posture. With an underhand grip on your right / left hand and an overhand grip on the opposite hand, grasp a broomstick or cane so that your hands are a little more than shoulder-width apart.  Keeping your right / left elbow straight and shoulder muscles relaxed, push the stick with your opposite hand to raise your right / left arm in front of your body and then overhead. Raise your arm until you feel a stretch in your right / left shoulder, but before you have increased shoulder pain.  Try to avoid shrugging your right / left shoulder as your arm rises by keeping your shoulder blade tucked down and toward your mid-back spine. Hold __________ seconds.  Slowly return to the starting position. Repeat __________ times. Complete this exercise __________ times per day.  STRETCH - Internal Rotation  Place your right / left hand behind your back, palm-up.  Throw a towel or belt over your opposite shoulder. Grasp the towel/belt with your right / left hand.  While keeping an upright posture, gently pull up on the towel/belt until you feel a stretch in the front of your right / left shoulder.  Avoid shrugging your right / left shoulder as your arm rises by keeping your shoulder blade tucked down and toward your mid-back spine.  Hold __________. Release the stretch by lowering your opposite hand. Repeat __________ times. Complete this exercise __________ times per day.  STRETCH - External Rotation and Abduction  Stagger your stance through a doorframe. It does not matter which  foot is forward.  As instructed by your physician, physical therapist or athletic trainer, place your hands:  And forearms above your head and on the door frame.  And forearms at head-height and on the door frame.  At elbow-height and on the door frame.  Keeping your head and chest upright and your stomach muscles tight to prevent over-extending your low-back, slowly shift your weight onto  your front foot until you feel a stretch across your chest and/or in the front of your shoulders.  Hold __________ seconds. Shift your weight to your back foot to release the stretch. Repeat __________ times. Complete this stretch __________ times per day.   STRENGTH - Supraspinatus  Stand or sit with good posture. Grasp a _______2 lb___ weight or an exercise band/tubing so that your hand is "thumbs-up," like when you shake hands.  Slowly lift your right / left hand from your thigh into the air, traveling about 30 degrees from straight out at your side. Lift your hand to shoulder height or as far as you can without increasing any shoulder pain. Initially, many people do not lift their hands above shoulder height.  Avoid shrugging your right / left shoulder as your arm rises by keeping your shoulder blade tucked down and toward your mid-back spine.  Hold for __________ seconds. Control the descent of your hand as you slowly return to your starting position. Repeat __________ times. Complete this exercise __________ times per day.

## 2013-04-10 NOTE — Progress Notes (Signed)
  Subjective:    Patient ID: Donna Herrera, female    DOB: April 23, 1946, 67 y.o.   MRN: [id number]  Shoulder Pain     1. Left shoulder pain. Patient with a history of bilateral rotator cuff repair surgeries. More recently her left shoulder has been giving her pain. She is using crutches to walk for her left hip pain. She's not been able to return to the orthopedist due to outstanding bill. She notes a pain in the left lateral and posterior aspect of the shoulder. There is some stiffness also. There has been no falls, numbness, tingling, swelling noted. She has previously had cortisone injections in the shoulder which do not help.  2. GERD. Requests refill of medication. Currently this is controlling her symptoms. Infrequently she notes a discomfort in her epigastrium. No recent problems. She notes that her brother in [place] may have had stomach cancer, though she is unsure.   3. HTN. Compliant with her metoprolol. She last took this yesterday about 24 hours ago. Has not had medicine yet today. No chest pain, edema, dyspnea. She was able to see the eye doctor after our last visit.   Review of Systems See HPI otherwise negative.  reports that she has been smoking.  She does not have any smokeless tobacco history on file.     Objective:   Physical Exam  Vitals reviewed. Constitutional: She is oriented to person, place, and time. She appears well-developed and well-nourished. No distress.  HENT:  Head: Normocephalic and atraumatic.  Neck: Neck supple.  Cardiovascular: Normal rate and regular rhythm.   No murmur heard. Pulmonary/Chest: Effort normal.  Musculoskeletal:  B shoulder with healed scars. Left shoulder has decreased active abduction to 120 degrees. Active ROM up to 150 degrees. TTP in posterior-lateral aspect.  Weakness with lift off. Intact internal rotation. Pain and weakness with external rotation. No effusion or warmth noted.   Neurological: She is alert and oriented to  person, place, and time.  Ambulates with a cane.  Skin: She is not diaphoretic.       Assessment & Plan:

## 2013-04-10 NOTE — Telephone Encounter (Signed)
Requested Prescriptions   Pending Prescriptions Disp Refills  . pantoprazole (PROTONIX) 40 MG tablet 90 tablet 3    Sig: Take 1 tablet (40 mg total) by mouth daily.   MED IS ON NATIONAL BACK ORDER - please substitute. Wyatt Haste, RN-BSN

## 2013-04-10 NOTE — Assessment & Plan Note (Signed)
Symptoms improved currently. Refill PPI. We discussed that if these return may consider testing for H pylori vs send for EGD. She is delinquent on colonoscopy, and given info to schedule.

## 2013-04-10 NOTE — Assessment & Plan Note (Signed)
Likely a recurrence of some rotator cuff tendinopathy given her history and exam. Patient is not willing to participate in formal PT, and refuses injection today. She has previously seen an orthopedist, and is considering return to Dr. Leslee Home. We discussed in detail some shoulder ROM exercises to perform in meantime. Have refilled vicodin for prn use. F/u in 1-2 months.

## 2013-04-12 ENCOUNTER — Other Ambulatory Visit: Payer: Self-pay | Admitting: Family Medicine

## 2013-05-15 ENCOUNTER — Other Ambulatory Visit: Payer: Self-pay | Admitting: *Deleted

## 2013-05-15 DIAGNOSIS — M25551 Pain in right hip: Secondary | ICD-10-CM

## 2013-05-16 ENCOUNTER — Telehealth: Payer: Self-pay | Admitting: Family Medicine

## 2013-05-16 NOTE — Telephone Encounter (Signed)
Called pt. Advised of Dr.McIntyre's note and pt agreed and will schedule OV with Dr.McIntyre to meet her new PCP. Marland KitchenLorenda Herrera, [name]

## 2013-05-16 NOTE — Telephone Encounter (Signed)
To [facility] red team - please call pt and let her know I refused the refill on her hydrocodone because she should have enough based on when Dr. Cristal Ford last refilled it. She will need to schedule an appointment to come in and meet me before I will prescribe her chronic pain medications. Thanks!

## 2013-05-27 ENCOUNTER — Encounter: Payer: Self-pay | Admitting: Family Medicine

## 2013-05-27 ENCOUNTER — Ambulatory Visit (INDEPENDENT_AMBULATORY_CARE_PROVIDER_SITE_OTHER): Payer: Medicare Other | Admitting: Family Medicine

## 2013-05-27 VITALS — BP 138/78 | HR 80 | Ht 67.0 in | Wt 183.0 lb

## 2013-05-27 DIAGNOSIS — M25559 Pain in unspecified hip: Secondary | ICD-10-CM

## 2013-05-27 DIAGNOSIS — M25551 Pain in right hip: Secondary | ICD-10-CM

## 2013-05-27 DIAGNOSIS — R51 Headache: Secondary | ICD-10-CM

## 2013-05-27 MED ORDER — HYDROCODONE-ACETAMINOPHEN 5-325 MG PO TABS: 1.0000 | ORAL_TABLET | Freq: Two times a day (BID) | ORAL | Status: DC | PRN

## 2013-05-27 NOTE — Patient Instructions (Addendum)
It was nice to meet you today!  I refilled your norco. Come back within 2 months to follow up on the chronic pain. I am referring you to orthopedics to have your hip evaluated. You will get a call to schedule this appointment.  Be well, Dr. Pollie Meyer

## 2013-05-27 NOTE — Progress Notes (Signed)
Patient ID: Donna Herrera, female   DOB: 10-Mar-1946, 67 y.o.   MRN: [id number]  HPI:  Right hip pain: history of hip replacement back in 1987. Began to have pain in R hip in the late 90s. Pain is primarily located in the R posterior hip area. Physical therapy did not help when she has tried it in the past. Is currently taking norco 1-2 times per day. Was last seen at [facility] 3 years ago and was told she needs a new hip replacement. Willing to return to ortho but states she can't go to that office because she owes money.  Headache: has generalized headaches, both in the front of her head and back. Unable to provide much more history. One time several months ago she had blurry vision and pain in her eye but this resolved. Does not get headache every day. She is getting new glasses next week. To alleviate the pain she takes a tylenol and lays down. Is unable to say whether this helps. The headaches have been present for a few months. She notes some numbness in her arms and hands ever since using crutches. Denies fevers.  ROS: See HPI. + hip pain. + headache. No fevers.  PMFSH: hx gout, hypercholesterolemia, HTN  PHYSICAL EXAM: BP 138/78  Pulse 80  Ht 5\' 7"  (1.702 m)  Wt 183 lb (83.008 kg)  BMI 28.66 kg/m2 Gen: NAD Neuro: speech intact. PERRL. Face symmetric. Sensation intact to light touch over bilateral face. Grip 5/5 bilaterally. Knee extension 5/5 bilaterally. Shoulder shrug 5/5 bilaterally. Tongue protrudes midline. scm 5/5 bilaterally. Back/hip: marked pain with palpation of posterior R hip. Also tenderness over posterior neck musculature.

## 2013-05-27 NOTE — Progress Notes (Signed)
Subjective:     Patient ID: Donna Herrera, female   DOB: 04/05/1946, 67 y.o.   MRN: [id number]  Back Pain  Patient presents today with right lower back and hip pain that sometimes radiates into her right thigh. She had a total hip arthroplasty in 1987 and according to her in the late 1990's she had the hip reevaluated by the orthopedic surgeon who conducted her surgery who said that she needs another hip. The pain been present for the past several years but recently has become much worse. She has been taking Norco as needed but it has not been working very well recently. Nothing else seems to help. She is concerned that she will not be able to finance another hip replacement. She denies any other issues except a gout flair up in January that was resolved with colchicine.  Also admits to headaches that occur more frequently than usual with left eye pain.    Review of Systems  Musculoskeletal: Positive for back pain.  HEENT: admits to headaches with associated left occular pain, some sensitivity to light. Denies dizziness,      Objective:   Physical Exam

## 2013-05-27 NOTE — Assessment & Plan Note (Signed)
Norco refilled today for 2 months worth. Will refer to orthopedic surgery for evaluation of R hip, as I suspect she needs a revision of her hip replacement. Precepted with Dr. Randolm Idol.

## 2013-05-27 NOTE — Assessment & Plan Note (Signed)
Neuro exam nonfocal today. In the past this headache has been evaluated with normal inflammatory markers, and was thought to be due to medication overuse. Pt did recently see eye doctor and has new rx for glasses, so I advised to her that she get her new glasses and see how her headaches are with the new prescription. It is possible that the headaches could be tension headaches from uncorrected vision problems. Advised pt to f/u if not improved with new glasses.

## 2013-05-28 ENCOUNTER — Telehealth: Payer: Self-pay | Admitting: *Deleted

## 2013-05-28 NOTE — Telephone Encounter (Signed)
Called patient to discuss orthopedic referral, she currently owes money to [employer]. I told patient she would have to pay her bill there before we can make an appointment to any orthopedic office. She is to let us know once this is done and we can process the referral at that time.Busick, Rodena Medin

## 2013-07-17 ENCOUNTER — Other Ambulatory Visit: Payer: Self-pay | Admitting: Family Medicine

## 2013-07-23 ENCOUNTER — Telehealth: Payer: Self-pay | Admitting: Family Medicine

## 2013-07-23 NOTE — Telephone Encounter (Signed)
Patient calls needing refill to pain meds. Stating having a lot of pain, due to the changes in the weather.

## 2013-07-24 NOTE — Telephone Encounter (Signed)
Pt advised to sched F/U per Dr Valorie Roosevelt last OV note. Pt agreed.

## 2013-07-31 ENCOUNTER — Ambulatory Visit (INDEPENDENT_AMBULATORY_CARE_PROVIDER_SITE_OTHER): Payer: Medicare Other | Admitting: Family Medicine

## 2013-07-31 ENCOUNTER — Encounter: Payer: Self-pay | Admitting: Family Medicine

## 2013-07-31 VITALS — BP 168/96 | HR 68 | Temp 97.3°F | Wt 183.1 lb

## 2013-07-31 DIAGNOSIS — R51 Headache: Secondary | ICD-10-CM

## 2013-07-31 DIAGNOSIS — I10 Essential (primary) hypertension: Secondary | ICD-10-CM

## 2013-07-31 DIAGNOSIS — M25551 Pain in right hip: Secondary | ICD-10-CM

## 2013-07-31 DIAGNOSIS — Z23 Encounter for immunization: Secondary | ICD-10-CM

## 2013-07-31 DIAGNOSIS — M25559 Pain in unspecified hip: Secondary | ICD-10-CM

## 2013-07-31 MED ORDER — CYCLOBENZAPRINE HCL 5 MG PO TABS: 5.0000 mg | ORAL_TABLET | Freq: Three times a day (TID) | ORAL | Status: DC | PRN

## 2013-07-31 MED ORDER — HYDROCODONE-ACETAMINOPHEN 5-325 MG PO TABS: 1.0000 | ORAL_TABLET | Freq: Two times a day (BID) | ORAL | Status: DC | PRN

## 2013-07-31 MED ORDER — HYDROCHLOROTHIAZIDE 25 MG PO TABS: 25.0000 mg | ORAL_TABLET | Freq: Every day | ORAL | Status: DC

## 2013-07-31 NOTE — Assessment & Plan Note (Signed)
Patient has financial barriers that have precluded her from being seen by orthopedics. I attempted to look patient up in the controlled substances database but was unsuccessful in finding her record. At this time I think it is reasonable to refill her Norco. She is getting only 30 tablets per month. Will reassess chronic pain in 2 months.

## 2013-07-31 NOTE — Assessment & Plan Note (Signed)
Blood pressure elevated today, despite reported compliance with medications. This is certainly not helping her headache. Will restart HCTZ 25 mg daily. Patient will followup in 3 weeks to have her blood pressure rechecked.

## 2013-07-31 NOTE — Assessment & Plan Note (Signed)
Patient has some chronic component of headache, but states this headache is new and different. The story is somewhat vague and difficult to elucidate the exact cause of her pain. She does seem to have a tension headache given the distribution of the headache in the front of her head and around the back. She is tender exterior spinal muscles so I think a trial of a muscle relaxer (Flexeril) is warranted. Will prescribe this for her for followup in 3 weeks to see if the headache is improved. There is no focality to her neuro exam to suggest an intracranial cause. Precepted with Dr. Mauricio Po, who also examined patient and agrees with this plan.

## 2013-07-31 NOTE — Patient Instructions (Addendum)
It was great to see you again today!  I want you to restart the hydrochlorothiazide 25mg  daily. I sent in a new prescription. I refilled your pain medicine today. I also sent in a muscle relaxer for you to take. Start with it just at night because it might make you sleepy, you can then increase to up to three times a day as needed.  Please follow up in 3 weeks to recheck the blood pressure and see how your headache is.  Be well, Dr. Pollie Meyer

## 2013-07-31 NOTE — Progress Notes (Signed)
Patient ID: Donna Herrera, female   DOB: 30-Nov-1945, 67 y.o.   MRN: [id number]   HPI:  Headache: Patient presents complaining of headache. She has had a chronic headache in the past, but this has worsened over the last 2 weeks. She has taken Tylenol for the headache without relief. The headache comes and goes at least twice per day. It is like a sharp pain that comes all at once, then gradually eases up. Sometimes the pain lasts a few minutes, and other times it can last a little longer. Denies having any alleviating or aggravating factors that she can think of. The pain is located across the top of her head and in the back of her head. She recently had an eye exam, and got reading glasses. She has been wearing the glasses but states that her headache is not improved with the new glasses. She also notes soreness in her posterior neck. She does also have some numbness in her hands that comes and goes. She denies fevers, photophobia, nausea or vomiting. She has on occasion had this headache while asleep, but states it hurts most of the time while awake as well.  Blood pressure: Patient currently takes metoprolol XL 50 mg daily. She does not check her blood pressure at home. She denies chest pain or shortness of breath. Also denies swelling in her legs. She does note chronic aches in her right arm and hip.  Chronic hip pain: Is currently using Vicodin about twice per day if the pain is bad. She states she has pain all over her body. The Vicodin helps.  ROS: See HPI  PMFSH: History of chronic pain in her hip. Has had a joint replacement before, and has been referred to orthopedics for evaluation for a new joint replacement, however she is unable to afford this and owes money to an orthopedic provider in town. We have the been unable to refer her to another orthopedic office.  PHYSICAL EXAM: BP 168/96  Pulse 68  Temp(Src) 97.3 F (36.3 C) (Oral)  Wt 183 lb 1.6 oz (83.054 kg)  BMI 28.67 kg/m2 Gen:  No acute distress, mildly irritable affect Heart: Regular rate and rhythm, no murmurs Lungs: Clear to auscultation bilaterally, normal work of breathing Neuro: Asymmetric, and speech normal, and pupils equal round and reactive to light. Tongue protrudes midline. Good strength in bilateral arms. Sensation intact to light touch bilaterally. Full range of motion of neck. Right eye disc examined by Dr. Mauricio Po who states that no papilledema is present. Neck: Posterior neck musculature is tender to palpation. Patient jumps with touching of her posterior shoulders and neck. Also jumps with touching muscles around her right hip  ASSESSMENT/PLAN:  # Health maintenance: -flu shot given today  See problem based charting for assessment/plan.  FOLLOW UP: F/u in 3 weeks for blood pressure check and headache. F/u in 2 months for chronic pain.  Latrelle Dodrill, MD

## 2013-09-04 ENCOUNTER — Encounter: Payer: Self-pay | Admitting: Family Medicine

## 2013-09-04 ENCOUNTER — Ambulatory Visit (INDEPENDENT_AMBULATORY_CARE_PROVIDER_SITE_OTHER): Payer: Medicare Other | Admitting: Family Medicine

## 2013-09-04 VITALS — BP 134/87 | HR 67 | Temp 98.2°F | Wt 186.8 lb

## 2013-09-04 DIAGNOSIS — S025XXB Fracture of tooth (traumatic), initial encounter for open fracture: Secondary | ICD-10-CM

## 2013-09-04 DIAGNOSIS — M25551 Pain in right hip: Secondary | ICD-10-CM

## 2013-09-04 DIAGNOSIS — S025XXA Fracture of tooth (traumatic), initial encounter for closed fracture: Secondary | ICD-10-CM

## 2013-09-04 DIAGNOSIS — M25559 Pain in unspecified hip: Secondary | ICD-10-CM

## 2013-09-04 DIAGNOSIS — G8929 Other chronic pain: Secondary | ICD-10-CM

## 2013-09-04 DIAGNOSIS — I10 Essential (primary) hypertension: Secondary | ICD-10-CM

## 2013-09-04 LAB — BASIC METABOLIC PANEL
BUN: 15 mg/dL (ref 6–23)
CO2: 28 mEq/L (ref 19–32)
Calcium: 9.3 mg/dL (ref 8.4–10.5)
Creat: 0.65 mg/dL (ref 0.50–1.10)
Glucose, Bld: 96 mg/dL (ref 70–99)
Sodium: 138 mEq/L (ref 135–145)

## 2013-09-04 NOTE — Patient Instructions (Addendum)
It was great to see you again today!  I will call you if your test results are not normal.  Otherwise, I will send you a letter.  If you do not hear from me with in 2 weeks please call our office.     I am referring you to a dentist for your tooth pain. You will get a phone call to schedule this appointment. You should also call to schedule an appointment.  I am referring you to physical therapy for your hip pain, to see if there are other assistive devices that could help. You will get a phone call to schedule this appointment.   Please follow up in 1 month to see how you are doing, and what the physical therapists recommend.  Be well, Dr. Pollie Meyer

## 2013-09-04 NOTE — Progress Notes (Signed)
Patient ID: Donna Herrera, female   DOB: [birth year], 67 y.o.   MRN: [id number]   HPI:  Hypertension: has been taking HCTZ 25 mg daily (prescribed at last visit) along with toprol XL 50mg  daily. Has not checked BP at home. She was complaining of headaches at her last visit, which we attributed possibly to elevated BP and muscle tension. States her headaches have been much better and that the flexeril helped. She denies shortness of breath. She did have an episode of Had a cramp-like chest pain a few weeks ago, for which she sat down and took an aspirin, and it got better. This episode lasted just a few minutes. It used to happen regularly, but then stopped until that one episode. Denies any recent recurrent events of chest pain. Knows to go to the ER if has CP that persists. She has noticed some increased muscle cramping since starting the HCTZ.  Tooth pain: has a tooth that broke off about 1.5 weeks ago. It has been painful since that time. Does not have a regular dentist. Denies having any fevers.  Chronic hip pain: Uses a cane to walk at home, and uses crutches when she goes out. Takes hydrocodone as needed for pain, it helps. Pain has been recently exacerbated by taking care of her great grandchildren. Has not seen a physical therapist in over 10 years. Does have some constipation with her narcotics, but uses prune juice to help her have a BM.   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  PHYSICAL EXAM: BP 134/87  Pulse 67  Temp(Src) 98.2 F (36.8 C) (Oral)  Wt 186 lb 12.8 oz (84.732 kg) Gen: NAD HEENT: One of the right lower teeth has broken off, and the nerve root is visible. Tender to palpation over the gum. No fluctuance, abscess, or drainage appreciable. No deformity of jaw. Mucous membranes moist. Heart: RRR Lungs: CTAB  Neuro: ambulates steadily using crutches, speech intact, grossly nonfocal neurologically  ASSESSMENT/PLAN:  # Broken tooth: - will  refer to dentistry for evaluation, as likely needs this tooth extracted in order to alleviate pain. No findings suggestive of need for antibiotics. Precepted with Dr. Gwendolyn Grant who also examined patient and agrees with this plan.   See problem based charting for additional assessment/plan.  FOLLOW UP: F/u in 1 month for hip pain Referring to dentistry as well.  [place] J. Pollie Meyer, MD [facility]

## 2013-09-06 ENCOUNTER — Encounter: Payer: Self-pay | Admitting: Family Medicine

## 2013-09-07 NOTE — Assessment & Plan Note (Signed)
BP improved with addition of HCTZ. Pt did mention that her gout flared last month, so will need to monitor for gout exacerbations with HCTZ, may have to switch to a different agent. Will continue with this for now. Check BMET today (question if K is possibly low, given increased muscle cramping).

## 2013-09-07 NOTE — Assessment & Plan Note (Signed)
Pain controlled by vicodin. Pt has one month's rx left. Will continue this medicine. I am referring her to physical therapy to be evaluated for assistive devices, as she is using crutches to ambulate outside of her home. Follow up in 1 month.

## 2013-09-16 ENCOUNTER — Other Ambulatory Visit: Payer: Self-pay | Admitting: Family Medicine

## 2013-09-16 ENCOUNTER — Encounter: Payer: Self-pay | Admitting: Family Medicine

## 2013-09-16 DIAGNOSIS — E785 Hyperlipidemia, unspecified: Secondary | ICD-10-CM | POA: Insufficient documentation

## 2013-09-20 ENCOUNTER — Ambulatory Visit: Payer: Medicare Other | Admitting: Physical Therapy

## 2013-10-10 ENCOUNTER — Telehealth: Payer: Self-pay | Admitting: Family Medicine

## 2013-10-10 MED ORDER — HYDROCODONE-ACETAMINOPHEN 5-325 MG PO TABS: 1.0000 | ORAL_TABLET | Freq: Two times a day (BID) | ORAL | Status: DC | PRN

## 2013-10-10 NOTE — Telephone Encounter (Signed)
Forward to PCP for request of pain meds.Busick, Rodena Medin

## 2013-10-10 NOTE — Telephone Encounter (Signed)
Refill request for hydrocodone.

## 2013-10-10 NOTE — Telephone Encounter (Signed)
Will leave one month's rx at front for patient to pick up, but she is due to schedule an appointment to follow up on her pain. Please notify pt. Also, please inquire as to whether she ever was evaluated by physical therapy. It looks as though she cancelled her appointment with them.  Latrelle Dodrill, MD

## 2013-10-11 NOTE — Telephone Encounter (Signed)
Patient called back today and was informed of MD's msg .

## 2013-10-28 ENCOUNTER — Ambulatory Visit (INDEPENDENT_AMBULATORY_CARE_PROVIDER_SITE_OTHER): Payer: Medicare Other | Admitting: Family Medicine

## 2013-10-28 ENCOUNTER — Encounter: Payer: Self-pay | Admitting: Family Medicine

## 2013-10-28 VITALS — BP 125/82 | HR 78 | Temp 98.3°F | Ht 67.0 in | Wt 183.0 lb

## 2013-10-28 DIAGNOSIS — M25559 Pain in unspecified hip: Secondary | ICD-10-CM

## 2013-10-28 DIAGNOSIS — M25551 Pain in right hip: Secondary | ICD-10-CM

## 2013-10-28 DIAGNOSIS — R05 Cough: Secondary | ICD-10-CM

## 2013-10-28 MED ORDER — HYDROCODONE-ACETAMINOPHEN 5-325 MG PO TABS
1.0000 | ORAL_TABLET | Freq: Two times a day (BID) | ORAL | Status: DC | PRN
Start: 2013-10-28 — End: 2013-10-28

## 2013-10-28 MED ORDER — HYDROCODONE-ACETAMINOPHEN 5-325 MG PO TABS: 1.0000 | ORAL_TABLET | Freq: Two times a day (BID) | ORAL | Status: DC | PRN

## 2013-10-28 MED ORDER — BENZONATATE 100 MG PO CAPS: 100.0000 mg | ORAL_CAPSULE | Freq: Two times a day (BID) | ORAL | Status: DC | PRN

## 2013-10-28 MED ORDER — CYCLOBENZAPRINE HCL 5 MG PO TABS: 5.0000 mg | ORAL_TABLET | Freq: Three times a day (TID) | ORAL | Status: DC | PRN

## 2013-10-28 NOTE — Progress Notes (Signed)
Patient ID: Donna Herrera, female   DOB: Jul 29, 1946, 67 y.o.   MRN: [id number]  HPI:  Cough: has had a cough for about 2-3 days. Felt warm yesterday but didn't check her temperature. Has produced some sputum. her ribs are sore from coughing but denies having pain in the center of her chest. No exertional chest pain. She has had a runny nose and congestion as well. Has also had a headache. Everyone else in her house has had a similar illness. She did get a flu shot this year. Denies vomiting or abdominal pain. She's having trouble sleeping because of the cough. Eating and drinking well. Has had some chills and sweating. Has not traveled recently. No acute changes in lower extremity edema (has had some chronic swelling).  Chronic pain: currently taking norco 5-325mg  twice daily prn. Thinks the pain medicine is doing all right. She has not gone to physical therapy because she did not have the copay to pay for it. She has been doing some home exercises. She's been using her crutches and cane to ambulate at home.   ROS: See HPI  PMFSH: hx chronic pain  PHYSICAL EXAM: BP 125/82  Pulse 78  Temp(Src) 98.3 F (36.8 C) (Oral)  Ht 5\' 7"  (1.702 m)  Wt 183 lb (83.008 kg)  BMI 28.66 kg/m2  SpO2 98% Gen: NAD HEENT: NCAT, PERRL, no oral exudates, no anterior cervical lymphadenopathy, TM's clear bilaterally, no nasal drainage appreciated Heart: RRR Lungs: NWOB but frequent cough. Coarse crackles present in bilateral bases, possibly worse on L than on R. No focal dullness to percussion appreciable. Neuro: tongue protrudes midline, A&Ox3, PERRL, full strength in bilat lower extremities (R limited by pain), speech normal Back: muscle spasm present in mid lower back on L side  ASSESSMENT/PLAN:  # Cough: no hypoxia. most likely viral URI, but given pt's age and possible focality of crackles in LLL, will obtain CXR to evaluate for pneumonia. - in the meantime, supportive care with tessalon perles for  cough - flexeril for back spasm (likely secondary to cough) - f/u if not better in 1 week, or if worsening before that  See problem based charting for additional assessment/plan.   FOLLOW UP: F/u in 1 week if cough not better F/u in 3 months for chronic pain  [place] J. Pollie Meyer, MD [facility]

## 2013-10-28 NOTE — Patient Instructions (Addendum)
It was great to see you again today!  I refilled your pain medicine. Please try to get in with physical therapy. Use caution and do not drive while taking the hydrocodone or the flexeril.  For your cough, we are getting a chest xray. Please go and get this done today. I will call you if we need to treat you with antibiotics. I also sent in a cough medicine (tessalon perles).  Return if worsening or not better in one week. Otherwise follow up in 3 months for your pain.  If you have any chest pain or shortness of breath that does not go away, please go to the ER immediately.  Be well, Dr. Pollie Meyer

## 2013-10-29 NOTE — Assessment & Plan Note (Signed)
Pain well managed with norco #30 per month. Will refill x3 months. I have strongly encouraged pt to go to PT and be evaluated at least one time for equipment recommendations. Pt knows she can call and schedule an appointment whenever she is able to afford the copay.

## 2013-11-06 ENCOUNTER — Ambulatory Visit (HOSPITAL_COMMUNITY)
Admission: RE | Admit: 2013-11-06 | Discharge: 2013-11-06 | Disposition: A | Discharge status: Home / Self Care | Payer: Medicare Other | Source: Ambulatory Visit | Attending: Family Medicine | Admitting: Family Medicine

## 2013-11-06 DIAGNOSIS — R6883 Chills (without fever): Secondary | ICD-10-CM | POA: Insufficient documentation

## 2013-11-06 DIAGNOSIS — R059 Cough, unspecified: Secondary | ICD-10-CM | POA: Insufficient documentation

## 2013-11-06 DIAGNOSIS — R05 Cough: Secondary | ICD-10-CM | POA: Insufficient documentation

## 2013-11-25 ENCOUNTER — Other Ambulatory Visit: Payer: Self-pay | Admitting: Family Medicine

## 2014-01-09 ENCOUNTER — Ambulatory Visit (INDEPENDENT_AMBULATORY_CARE_PROVIDER_SITE_OTHER): Payer: Medicare Other | Admitting: Family Medicine

## 2014-01-09 ENCOUNTER — Encounter: Payer: Self-pay | Admitting: Family Medicine

## 2014-01-09 VITALS — BP 120/77 | HR 100 | Ht 67.0 in | Wt 182.0 lb

## 2014-01-09 DIAGNOSIS — M109 Gout, unspecified: Secondary | ICD-10-CM

## 2014-01-09 DIAGNOSIS — M25551 Pain in right hip: Secondary | ICD-10-CM

## 2014-01-09 DIAGNOSIS — M25559 Pain in unspecified hip: Secondary | ICD-10-CM

## 2014-01-09 MED ORDER — HYDROCODONE-ACETAMINOPHEN 5-325 MG PO TABS: 1.0000 | ORAL_TABLET | Freq: Two times a day (BID) | ORAL | Status: DC | PRN

## 2014-01-09 MED ORDER — COLCHICINE 0.6 MG PO CAPS: ORAL_CAPSULE | ORAL | Status: DC

## 2014-01-09 NOTE — Progress Notes (Signed)
Patient ID: Donna Herrera, female   DOB: [birth year], 68 y.o.   MRN: [id number]  HPI:  Hip pain f/u: has had continued generalized hip pain. Has also had some low back pain. Feels the pain in her muscles. Her left hip is now hurting, in addition to her R hip. She uses a cane to get around at home and crutches when she leaves the house. She takes flexeril on occasion. Also taking norco about twice per day, as needed. She breaks up the pills. This medicine helps her tolerate the pain.   Left thumb swelling: has had swelling in her left thumb for about a month. Has never had swelling there in the past. Does have a hx of gout, which in the past has been located in her feet, toes, wrists, and hands. Never in her thumb though. The swelling is also the site of an achy pain. It was red and warm initially but is not any more. She did not injure it. Took some aleve when it first started, unsure if that helped. Stopped taking her colcrys several months ago because it became too expensive. She has been taking HCTZ every day for her blood pressure.  ROS: See HPI  [place]: hx HTN, chronic hip pain s/p "fusion of L hip" & replacement of R hip, hx gout  PHYSICAL EXAM: BP 120/77  Pulse 100  Ht 5\' 7"  (1.702 m)  Wt 182 lb (82.555 kg)  BMI 28.50 kg/m2 Gen: NAD HEENT: NCAT Heart: RRR Lungs: CTAB, NWOB Neuro: grossly nonfocal, speech intact Ext: L hand with tophi-like nodule around first MCP joint. No erythema or fluctuant effusion. Somewhat diminished ROM of first MCP joint. Hips with diminished ROM of L hip with internal & external rotation. Strength with hip flexion slightly decreased bilaterally secondary to pain. Hips NTTP.  ASSESSMENT/PLAN:  # Health maintenance:  -advised that pt schedule a separate visit to address health maintenance issues.  See problem based charting for additional assessment/plan.  FOLLOW UP: F/u in 3-4 weeks for gout & HTN. Pt to schedule appt with ortho office [facility]). Schedule separate health maintenance visit.  [place]. Ardelia Mems, [place]

## 2014-01-09 NOTE — Patient Instructions (Addendum)
It was great to see you again today!  I refilled your pain medicine for 3 months (norco). We need to get you in to see the orthopedic doctor. This is very important. Please call [facility] to get an appointment.  Take the colcrys 2 pills once, then in 1 hour take 1 pill. Then wait 12 hours to start taking it twice a day.  Return in 3-4 weeks to follow up on the gout. STOP the HCTZ.  Be well, Dr. Ardelia Mems

## 2014-01-14 NOTE — Assessment & Plan Note (Signed)
Continued chronic R hip pain, now causing pain on L which I suspect is due to compensatory gait changes from R hip pain. Stressed to pt the importance of getting in to be seen by orthopedics given her prior operative history. She has not been able to schedule an appointment recently due to still owing money to one of the local ortho offices. She would like to try to be seen at [facility]. She will call and schedule a new patient appointment there. Refilled norco x 3 months.

## 2014-01-14 NOTE — Assessment & Plan Note (Signed)
Tophi-like nodule present on left first MCP joint. Suspect she recently had a flare which went untreated due to not presenting until 1 month after it began. Likely due to a combination of taking HCTZ and stopping colchicine. Will re-prescribe colchicine to her today as it has recently become generic and thus should hopefully become more affordable. Also found a coupon for her online and printed it for her. Instructed her to stop taking HCTZ. F/u in 3-4 weeks to recheck BP off HCTZ and see if gout has improved. Precepted with Dr. Lindell Noe who also examined patient and agrees with this plan.

## 2014-02-27 ENCOUNTER — Other Ambulatory Visit: Payer: Self-pay | Admitting: Family Medicine

## 2014-02-28 ENCOUNTER — Other Ambulatory Visit: Payer: Self-pay | Admitting: Family Medicine

## 2014-02-28 NOTE — Telephone Encounter (Signed)
Covering box for PCP  Refilled metoprolol and sucralfate. Metoprolol refilled last month with 11 refills, unclear why she would need one already but will send again.   Laroy Apple, MD [place] Resident, PGY-2 02/28/2014, 10:29 AM

## 2014-04-25 ENCOUNTER — Ambulatory Visit (INDEPENDENT_AMBULATORY_CARE_PROVIDER_SITE_OTHER): Payer: Medicare Other | Admitting: Family Medicine

## 2014-04-25 ENCOUNTER — Encounter: Payer: Self-pay | Admitting: Family Medicine

## 2014-04-25 VITALS — BP 135/90 | HR 74 | Temp 98.5°F | Wt 182.8 lb

## 2014-04-25 DIAGNOSIS — G8929 Other chronic pain: Secondary | ICD-10-CM

## 2014-04-25 DIAGNOSIS — R51 Headache: Secondary | ICD-10-CM

## 2014-04-25 MED ORDER — MELOXICAM 15 MG PO TABS: 15.0000 mg | ORAL_TABLET | Freq: Every day | ORAL | Status: DC

## 2014-04-25 MED ORDER — HYDROCODONE-ACETAMINOPHEN 5-325 MG PO TABS: 1.0000 | ORAL_TABLET | Freq: Two times a day (BID) | ORAL | Status: DC | PRN

## 2014-04-25 NOTE — Assessment & Plan Note (Addendum)
As with previous encounters, headaches are vague and reportedly new despite previous notes on the topic. No red flags. No migrainous symptoms. Relieved somewhat with flexeril. Does also complain of pain and tension in her neck. Gave trial mobic and recommended heating pads and f/u with Dr. Ardelia Mems in a few weeks.

## 2014-04-25 NOTE — Progress Notes (Signed)
   Subjective:    Patient ID: Donna Herrera, female    DOB: 1946-03-25, 68 y.o.   MRN: [id number]  Headache     HEADACHE   Onset: 2 weeks  Location: diffuse Quality: dull ache Frequency: most days Precipitating factors: neck tension Prior treatment: flexeril works sometimes  Associated Symptoms Nausea/vomiting: no  Photophobia/phonophobia: no  Tearing of eyes: no  Sinus pain/pressure: no  Family hx migraine: no   Red Flags Fever: no  Neck pain/stiffness: no  Vision/speech/swallow/hearing difficulty: no  Focal weakness/numbness: yes, numb hands bilaterally but this predates headaches and seems to be related to gout  Altered mental status: no  Trauma: no  New type of headache: no  Anticoagulant use: no  H/o cancer/HIV/Pregnancy: no   Patient also complaining of multiple other issues for which she was asked to come back to see her regular doctor.   Review of Systems  Neurological: Positive for headaches.   See HPI    Objective:   Physical Exam  Nursing note and vitals reviewed. Constitutional: She is oriented to person, place, and time. She appears well-developed and well-nourished. No distress.  HENT:  Head: Normocephalic and atraumatic.  Eyes: Conjunctivae and EOM are normal. Pupils are equal, round, and reactive to light. Right eye exhibits no discharge. Left eye exhibits no discharge. No scleral icterus.  Neck: Normal range of motion. Neck supple.  Mild tenderness of paraspinous muscles  Cardiovascular: Normal rate.   Pulmonary/Chest: Effort normal.  Neurological: She is alert and oriented to person, place, and time. No cranial nerve deficit.  Skin: Skin is warm and dry. She is not diaphoretic.  Psychiatric: She has a normal mood and affect. Her behavior is normal.          Assessment & Plan:

## 2014-04-25 NOTE — Patient Instructions (Signed)
Please apply heating pad to the back of your neck as needed and take mobic to reduce inflammation and help with your headaches.  Please see Dr. Ardelia Mems in 2 weeks to discuss your chronic medical problems.

## 2014-04-25 NOTE — Assessment & Plan Note (Signed)
Refilled vicodin #30 for 1 month and asked that she f/u with Dr. Ardelia Mems for further evaluation of her pain

## 2014-05-15 ENCOUNTER — Encounter: Payer: Self-pay | Admitting: Family Medicine

## 2014-05-15 ENCOUNTER — Ambulatory Visit (INDEPENDENT_AMBULATORY_CARE_PROVIDER_SITE_OTHER): Payer: Medicare Other | Admitting: Family Medicine

## 2014-05-15 VITALS — BP 148/78 | HR 82 | Temp 98.1°F | Wt 185.4 lb

## 2014-05-15 DIAGNOSIS — M25551 Pain in right hip: Secondary | ICD-10-CM

## 2014-05-15 DIAGNOSIS — R21 Rash and other nonspecific skin eruption: Secondary | ICD-10-CM | POA: Insufficient documentation

## 2014-05-15 DIAGNOSIS — R51 Headache: Secondary | ICD-10-CM

## 2014-05-15 DIAGNOSIS — M25559 Pain in unspecified hip: Secondary | ICD-10-CM

## 2014-05-15 MED ORDER — TRIAMCINOLONE ACETONIDE 0.5 % EX OINT: 1.0000 "application " | TOPICAL_OINTMENT | Freq: Two times a day (BID) | CUTANEOUS | Status: DC

## 2014-05-15 MED ORDER — CYCLOBENZAPRINE HCL 5 MG PO TABS: 5.0000 mg | ORAL_TABLET | Freq: Three times a day (TID) | ORAL | Status: DC | PRN

## 2014-05-15 MED ORDER — HYDROCODONE-ACETAMINOPHEN 5-325 MG PO TABS: 1.0000 | ORAL_TABLET | Freq: Two times a day (BID) | ORAL | Status: DC | PRN

## 2014-05-15 NOTE — Patient Instructions (Signed)
It was great to see you again today!  For rash - I sent in  New ointment, use it twice a day  For pain - refilled your norco. I am referring you to physical therapy for your pain. You will get a phone call to schedule this appointment.   For headache - refilled flexeril. I think this is from muscle spasm.  I will call you if your test results are not normal.  Otherwise, I will send you a letter.  If you do not hear from me with in 2 weeks please call our office.      Follow up in 3-4 weeks.  Be well, Dr. Ardelia Mems

## 2014-05-15 NOTE — Progress Notes (Signed)
Patient ID: Donna Herrera, female   DOB: [birth year], 68 y.o.   MRN: [id number]  HPI:  Pt presents to f/u on her arthritis and headache.  Arthritis - on chronic narcotics. Takes hydrocodone as needed, which helps the pain. Last took it the other day. Takes it for pain in her hip. Uses crutches to ambulate. These have started to cause problems with her shoulders. Has not been evaluated by PT or ortho despite my urging and referral. She now states she does not want any surgery but is willing to be evaluated by physical therapy.  Headache - has hx of chronic headaches. Currently has a HA which is located on the L side of her forehead. The headache started a few weeks ago and has been off and on. It is typical for her usual headaches.  Rash - thinks her hands are puffy. Has a spot on her hand that she wants me to look at. It's a little itchy. Has also noted palms and fingers getting darker.  ROS: See HPI  [place]: hx HTN, DJD, chronic pain in R hip, headaches, HLD  PHYSICAL EXAM: BP 148/78  Pulse 82  Temp(Src) 98.1 F (36.7 C) (Oral)  Wt 185 lb 6.4 oz (84.097 kg) Gen: NAD HEENT: NCAT, PERRL, EOMI, MMM. Posterior neck with tense, tender muscle spasm but no true nuchal ridigity. Heart: RRR, no murmurs Lungs: CTAB, NWOB Neuro: speech normal, face symmetric, tongue protrudes midline, full strength in all extremities, PERRL, EOMI, shoulder shrug 5/5, some dysmetria/hesitation on FNF Skin: whitish round quarter sized area of flaky/dry skin on palm of one hand. Slight hyperpigmentation of palms of hands.  ASSESSMENT/PLAN:  See problem based charting for additional assessment/plan.  FOLLOW UP: F/u in 3-4 weeks for pain, headache, rash  [place] J. Ardelia Mems, [place]

## 2014-05-16 LAB — COMPREHENSIVE METABOLIC PANEL
ALBUMIN: 4.2 g/dL (ref 3.5–5.2)
ALK PHOS: 119 U/L — AB (ref 39–117)
ALT: 14 U/L (ref 0–35)
AST: 19 U/L (ref 0–37)
BUN: 12 mg/dL (ref 6–23)
CO2: 24 mEq/L (ref 19–32)
CREATININE: 0.71 mg/dL (ref 0.50–1.10)
Calcium: 9 mg/dL (ref 8.4–10.5)
Chloride: 103 mEq/L (ref 96–112)
Glucose, Bld: 130 mg/dL — ABNORMAL HIGH (ref 70–99)
POTASSIUM: 3.4 meq/L — AB (ref 3.5–5.3)
Sodium: 139 mEq/L (ref 135–145)
Total Bilirubin: 0.4 mg/dL (ref 0.2–1.2)
Total Protein: 7.4 g/dL (ref 6.0–8.3)

## 2014-05-16 LAB — RPR

## 2014-05-19 NOTE — Assessment & Plan Note (Signed)
Refilled norco x 1 month. Pt now verbalizes that she does not want any surgeries in the future. She is at least willing to be evaluated by physical therapy, which I think is important in terms of determining the best assistive devices/equipment for mobility. Will enter referral. F/u in 3-4 weeks to ensure this has been done.

## 2014-05-19 NOTE — Assessment & Plan Note (Addendum)
Appearance consistent with possible dyshidrotic eczema. Does not appear to be anything dangerous or life threatening. Does have darkening of palms/fingers. Will rx triamcinolone for the eczematous area. Check RPR, CMET. F/u in 3-4 weeks to monitor for improvement.

## 2014-05-19 NOTE — Assessment & Plan Note (Signed)
Consistent with previous chronic headaches. Pt has much muscle spasm on posterior aspect of neck, which I think is contributing to her headache. Will refill flexeril. Checking CMET today. F/u in 3-4 weeks to evaluate for improvement.

## 2014-05-22 ENCOUNTER — Ambulatory Visit (INDEPENDENT_AMBULATORY_CARE_PROVIDER_SITE_OTHER): Payer: Medicare Other | Admitting: Family Medicine

## 2014-05-22 VITALS — BP 155/86 | HR 71 | Temp 98.0°F | Wt 187.0 lb

## 2014-05-22 DIAGNOSIS — R21 Rash and other nonspecific skin eruption: Secondary | ICD-10-CM

## 2014-05-22 DIAGNOSIS — L299 Pruritus, unspecified: Secondary | ICD-10-CM

## 2014-05-22 LAB — COMPREHENSIVE METABOLIC PANEL
ALT: 12 U/L (ref 0–35)
AST: 16 U/L (ref 0–37)
Albumin: 4.2 g/dL (ref 3.5–5.2)
Alkaline Phosphatase: 114 U/L (ref 39–117)
BILIRUBIN TOTAL: 0.5 mg/dL (ref 0.2–1.2)
BUN: 13 mg/dL (ref 6–23)
CALCIUM: 8.9 mg/dL (ref 8.4–10.5)
CHLORIDE: 105 meq/L (ref 96–112)
CO2: 24 meq/L (ref 19–32)
CREATININE: 0.64 mg/dL (ref 0.50–1.10)
GLUCOSE: 91 mg/dL (ref 70–99)
Potassium: 3.7 mEq/L (ref 3.5–5.3)
Sodium: 137 mEq/L (ref 135–145)
Total Protein: 7.3 g/dL (ref 6.0–8.3)

## 2014-05-22 LAB — CBC
HEMATOCRIT: 40 % (ref 36.0–46.0)
HEMOGLOBIN: 12.9 g/dL (ref 12.0–15.0)
MCH: 27.9 pg (ref 26.0–34.0)
MCHC: 32.3 g/dL (ref 30.0–36.0)
MCV: 86.4 fL (ref 78.0–100.0)
Platelets: 238 10*3/uL (ref 150–400)
RBC: 4.63 MIL/uL (ref 3.87–5.11)
RDW: 13.7 % (ref 11.5–15.5)
WBC: 4.3 10*3/uL (ref 4.0–10.5)

## 2014-05-22 MED ORDER — PREDNISONE 10 MG PO TABS: 10.0000 mg | ORAL_TABLET | Freq: Every day | ORAL | Status: DC

## 2014-05-22 MED ORDER — CETIRIZINE HCL 10 MG PO TABS: 10.0000 mg | ORAL_TABLET | Freq: Every day | ORAL | Status: DC

## 2014-05-22 NOTE — Patient Instructions (Signed)
I am not sure what is causing your rash. We are checking bloodwork. I will call you if your test results are not normal.  Otherwise, I will send you a letter.  If you do not hear from me with in 2 weeks please call our office.     I sent in prednisone 10mg  daily for you for 5 days Take zyrtec once daily, I sent this in too Return in 1 week to recheck  If you have any chest pain that does not go away within 30 minutes, is accompanied by nausea, sweating, shortness of breath, or made worse by activity, go to the emergency room immediately for evaluation.  Be well, Dr. Ardelia Mems

## 2014-05-22 NOTE — Progress Notes (Signed)
Patient ID: Donna Herrera, female   DOB: [birth year], 68 y.o.   MRN: [id number]  HPI:  Rash: Itching over her entire body for the last week. Her daughter saw a rash on her back. She denies changes in detergents/soaps. Uses dial soap and dove soap. No recent bug bites but started meloxicam on June 26. No fevers. Hands have been swelling, also has numbness in both hands but this has been chronic. Itching worse at night. At last week's visit I gave her rx for topical steroid ointment for her hands due to appearance of possible dyshidrotic eczema. That is mildly improved.  On ROS, endorses episode of chest discomfort lasting a few minutes the other night. Not worse with exertion, did not radiate to neck or arm. No associated sweating or nausea. Spontaneously resolved. No other chest pain.  ROS: See HPI  [place]: hx HTN, DJD, chronic pain in R hip, headaches, HLD  PHYSICAL EXAM: BP 155/86  Pulse 71  Temp(Src) 98 F (36.7 C)  Wt 187 lb (84.823 kg) Gen: NAD HEENT: NCAT, no oral lesions, MMM Heart: RRR, no murmurs Lungs: CTAB, NWOB Neuro: grossly nonfocal, speech normal Skin: no rashes present on torso, back, arms, or legs. Does have still fine whitish flaky area on palms of hands. Some hyperpigmentation of palms/fingers present as well.   ASSESSMENT/PLAN:  Chest pain endorsed on ROS not cardiac in description. Continue to monitor for recurrence and consider cardiac or other workup if recurs.  Rash Complaints of itching over entire body x 1 week. No rashes seen, despite report that daughter did see a rash on her back. She does still have the area on her hands which may represent dyshydrotic eczema but these are slightly improving with steroid ointment. Etiology not clear for why pt is itching. Plan: -check CBC, CMET today to evaluate for cholestasis, polycythemia vera, etc. -rx low dose prednisone x 5 days to calm itching -start zyrtec 10mg  daily -f/u in 1 week to monitor for  improvement. -precepted with Dr. Erin Hearing who also examined patient and agrees with this plan.     FOLLOW UP: F/u in 1 week for itching  [place] J. Ardelia Mems, [place]

## 2014-05-23 ENCOUNTER — Encounter: Payer: Self-pay | Admitting: Family Medicine

## 2014-05-27 NOTE — Assessment & Plan Note (Addendum)
Complaints of itching over entire body x 1 week. No rashes seen, despite report that daughter did see a rash on her back. She does still have the area on her hands which may represent dyshydrotic eczema but these are slightly improving with steroid ointment. Etiology not clear for why pt is itching. Plan: -check CBC, CMET today to evaluate for cholestasis, polycythemia vera, etc. -rx low dose prednisone x 5 days to calm itching -start zyrtec 10mg  daily -f/u in 1 week to monitor for improvement. -precepted with Dr. Erin Hearing who also examined patient and agrees with this plan.

## 2014-06-26 ENCOUNTER — Other Ambulatory Visit: Payer: Self-pay | Admitting: Family Medicine

## 2014-07-17 ENCOUNTER — Ambulatory Visit (INDEPENDENT_AMBULATORY_CARE_PROVIDER_SITE_OTHER): Payer: Medicare Other | Admitting: Family Medicine

## 2014-07-17 ENCOUNTER — Encounter: Payer: Self-pay | Admitting: Family Medicine

## 2014-07-17 ENCOUNTER — Ambulatory Visit (HOSPITAL_COMMUNITY)
Admission: RE | Admit: 2014-07-17 | Discharge: 2014-07-17 | Disposition: A | Discharge status: Home / Self Care | Payer: Medicare Other | Source: Ambulatory Visit | Attending: Family Medicine | Admitting: Family Medicine

## 2014-07-17 ENCOUNTER — Other Ambulatory Visit: Payer: Self-pay | Admitting: Family Medicine

## 2014-07-17 VITALS — BP 147/77 | HR 82 | Temp 98.4°F | Ht 67.0 in | Wt 186.6 lb

## 2014-07-17 DIAGNOSIS — N6320 Unspecified lump in the left breast, unspecified quadrant: Secondary | ICD-10-CM

## 2014-07-17 DIAGNOSIS — M25559 Pain in unspecified hip: Secondary | ICD-10-CM

## 2014-07-17 DIAGNOSIS — M25551 Pain in right hip: Secondary | ICD-10-CM

## 2014-07-17 DIAGNOSIS — N63 Unspecified lump in unspecified breast: Secondary | ICD-10-CM

## 2014-07-17 DIAGNOSIS — R0789 Other chest pain: Secondary | ICD-10-CM | POA: Diagnosis not present

## 2014-07-17 DIAGNOSIS — R079 Chest pain, unspecified: Secondary | ICD-10-CM

## 2014-07-17 DIAGNOSIS — R252 Cramp and spasm: Secondary | ICD-10-CM

## 2014-07-17 LAB — BASIC METABOLIC PANEL
BUN: 16 mg/dL (ref 6–23)
CO2: 24 mEq/L (ref 19–32)
CREATININE: 0.71 mg/dL (ref 0.50–1.10)
Calcium: 9.3 mg/dL (ref 8.4–10.5)
Chloride: 106 mEq/L (ref 96–112)
GLUCOSE: 104 mg/dL — AB (ref 70–99)
Potassium: 4 mEq/L (ref 3.5–5.3)
Sodium: 140 mEq/L (ref 135–145)

## 2014-07-17 LAB — CK: Total CK: 295 U/L — ABNORMAL HIGH (ref 7–177)

## 2014-07-17 LAB — POCT SEDIMENTATION RATE: POCT SED RATE: 26 mm/hr — AB (ref 0–22)

## 2014-07-17 LAB — C-REACTIVE PROTEIN: CRP: <0.5 mg/dL (ref <0.60)

## 2014-07-17 MED ORDER — HYDROCODONE-ACETAMINOPHEN 5-325 MG PO TABS: 1.0000 | ORAL_TABLET | Freq: Two times a day (BID) | ORAL | Status: DC | PRN

## 2014-07-17 NOTE — Patient Instructions (Addendum)
For muscle cramps: -we are checking bloodwork -stop lipitor (atorvastatin)  For pain: -I refilled norco for 2 months worth -I am referring you to physical therapy for your pain. You will get a phone call to schedule this appointment. This is very important!! -follow up with me in 2 months  For breast mass: -checking diagnostic mammogram  If you have any chest pain that does not go away within 30 minutes, is accompanied by nausea, sweating, shortness of breath, or made worse by activity, go to the emergency room immediately for evaluation.  Be well, Dr. Ardelia Mems

## 2014-07-17 NOTE — Progress Notes (Signed)
Patient ID: Donna Herrera, female   DOB: 1946-05-02, 68 y.o.   MRN: [id number]  HPI:  Breast mass: thinks she felt a mass in her left breast. Then went back and wasn't able to feel it.   Hip pain: continues to have severe hip pain. Has not seen physical therapy.   Cramping: has had cramping in her hands, feet, sides. Has a cramp in chest for a few seconds one day last week. That night had some trouble breathing. No pain with exertion. No chest pain now.   L shoulder pain: also has severe pain in L shoulder. Uses crutches to ambulate, states she's used crutches for a long time.   ROS: See HPI.  [place]: current smoker, working on quitting  PHYSICAL EXAM: BP 147/77  Pulse 82  Temp(Src) 98.4 F (36.9 C) (Oral)  Ht 5\' 7"  (1.702 m)  Wt 186 lb 9.6 oz (84.641 kg)  BMI 29.22 kg/m2 Gen: NAD HEENT: NCAT Heart: RRR no murmurs Lungs: CTAB, NWOB Neuro: grossly nonfocal speech normal Ext: pain with internal & external rotation of R hip. L shoulder with significant crepitus with movement. Not able to raise L arm above horizontal plane. Breasts: palpable fullness/mass in L breast in 6oclock position. No axillary lymphadenopathy appreciable on either side. R breast without masses or fullness. No nipple or skin changes on either side.  ASSESSMENT/PLAN:  Cramps, muscle, general Unclear etiology. Will check electrolytes, CK, sed rate, CRP today. Had episode of cramping in chest, which does not sound anginal or exertional. EKG today nonischemic. Will stop statin in the meantime in case this is related to statin side effect.  Mass of breast, left Palpable on exam today. Will check bilat diagnostic mammogram.  Right hip pain Again reinforced to pt the need for physical therapy evaluation. Crutches are not a long term solution to her difficulties with ambulation. I suspect her chronic crutch use has resulted in adhesive capsulitis of her L shoulder. Refilled norco x 2 months worth. F/u in 2 mos  after PT eval.   FOLLOW UP: F/u in 2 months for chronic pain Referring to physical therapy F/u sooner pending results of tests & diagnostic mammogram ordered today  Donna Herrera, [place]

## 2014-07-21 ENCOUNTER — Other Ambulatory Visit: Payer: Self-pay | Admitting: Family Medicine

## 2014-07-21 ENCOUNTER — Other Ambulatory Visit: Payer: Self-pay

## 2014-07-21 DIAGNOSIS — N6320 Unspecified lump in the left breast, unspecified quadrant: Secondary | ICD-10-CM | POA: Insufficient documentation

## 2014-07-21 DIAGNOSIS — R252 Cramp and spasm: Secondary | ICD-10-CM | POA: Insufficient documentation

## 2014-07-21 HISTORY — DX: Unspecified lump in the left breast, unspecified quadrant: N63.20

## 2014-07-21 NOTE — Assessment & Plan Note (Signed)
Palpable on exam today. Will check bilat diagnostic mammogram.

## 2014-07-21 NOTE — Assessment & Plan Note (Signed)
Again reinforced to pt the need for physical therapy evaluation. Crutches are not a long term solution to her difficulties with ambulation. I suspect her chronic crutch use has resulted in adhesive capsulitis of her L shoulder. Refilled norco x 2 months worth. F/u in 2 mos after PT eval.

## 2014-07-21 NOTE — Assessment & Plan Note (Signed)
Unclear etiology. Will check electrolytes, CK, sed rate, CRP today. Had episode of cramping in chest, which does not sound anginal or exertional. EKG today nonischemic. Will stop statin in the meantime in case this is related to statin side effect.

## 2014-07-23 ENCOUNTER — Other Ambulatory Visit: Payer: Self-pay | Admitting: Family Medicine

## 2014-07-23 DIAGNOSIS — N6320 Unspecified lump in the left breast, unspecified quadrant: Secondary | ICD-10-CM

## 2014-07-24 ENCOUNTER — Ambulatory Visit
Admission: RE | Admit: 2014-07-24 | Discharge: 2014-07-24 | Disposition: A | Discharge status: Home / Self Care | Payer: Medicare Other | Source: Ambulatory Visit | Attending: Family Medicine | Admitting: Family Medicine

## 2014-07-24 DIAGNOSIS — N6320 Unspecified lump in the left breast, unspecified quadrant: Secondary | ICD-10-CM

## 2014-07-30 ENCOUNTER — Telehealth: Payer: Self-pay | Admitting: *Deleted

## 2014-07-30 NOTE — Telephone Encounter (Signed)
Pt informed. Semya Klinke CMA 

## 2014-07-30 NOTE — Telephone Encounter (Signed)
Message copied by Junious Dresser on Wed Jul 30, 2014  2:13 PM ------      Message from: Leeanne Rio      Created: Tue Jul 29, 2014 11:49 PM       [facility] red team, please let pt know that her muscle enzyme test was a little high, so I still recommend that she NOT take the cholesterol medicine she was on before. Please call her and just reiterate that she shouldn't be on the cholesterol medicine. Thanks, Leeanne Rio, MD ------

## 2014-07-31 ENCOUNTER — Other Ambulatory Visit: Payer: Self-pay | Admitting: Family Medicine

## 2014-08-30 ENCOUNTER — Other Ambulatory Visit: Payer: Self-pay | Admitting: Family Medicine

## 2014-09-24 ENCOUNTER — Encounter: Payer: Self-pay | Admitting: Family Medicine

## 2014-09-24 ENCOUNTER — Ambulatory Visit (INDEPENDENT_AMBULATORY_CARE_PROVIDER_SITE_OTHER): Payer: Medicare Other | Admitting: Family Medicine

## 2014-09-24 ENCOUNTER — Ambulatory Visit (INDEPENDENT_AMBULATORY_CARE_PROVIDER_SITE_OTHER): Payer: Medicare Other | Admitting: *Deleted

## 2014-09-24 VITALS — BP 131/65 | HR 76 | Temp 98.1°F | Ht 67.0 in | Wt 189.7 lb

## 2014-09-24 DIAGNOSIS — Z23 Encounter for immunization: Secondary | ICD-10-CM

## 2014-09-24 DIAGNOSIS — Z0000 Encounter for general adult medical examination without abnormal findings: Secondary | ICD-10-CM

## 2014-09-24 DIAGNOSIS — Z720 Tobacco use: Secondary | ICD-10-CM

## 2014-09-24 DIAGNOSIS — M25551 Pain in right hip: Secondary | ICD-10-CM

## 2014-09-24 DIAGNOSIS — R252 Cramp and spasm: Secondary | ICD-10-CM

## 2014-09-24 MED ORDER — HYDROCODONE-ACETAMINOPHEN 5-325 MG PO TABS
1.0000 | ORAL_TABLET | Freq: Two times a day (BID) | ORAL | Status: DC | PRN
Start: 2014-09-24 — End: 2014-09-24

## 2014-09-24 MED ORDER — HYDROCODONE-ACETAMINOPHEN 5-325 MG PO TABS: 1.0000 | ORAL_TABLET | Freq: Two times a day (BID) | ORAL | Status: DC | PRN

## 2014-09-24 NOTE — Patient Instructions (Signed)
Refilled pain medicine today.  See handouts about how to get tetanus and shingles shots. See the handout on how to schedule your colonoscopy. This is an important screening test for colon cancer.  Call 1800QUITNOW for help with quitting smoking.  We can do your pap another time.  Follow up in 2 months for pain medicine refills. I will try to get a physical therapist to come to your house.  Be well, Dr. Ardelia Mems    Health Maintenance Adopting a healthy lifestyle and getting preventive care can go a long way to promote health and wellness. Talk with your health care provider about what schedule of regular examinations is right for you. This is a good chance for you to check in with your provider about disease prevention and staying healthy. In between checkups, there are plenty of things you can do on your own. Experts have done a lot of research about which lifestyle changes and preventive measures are most likely to keep you healthy. Ask your health care provider for more information. WEIGHT AND DIET  Eat a healthy diet  Be sure to include plenty of vegetables, fruits, low-fat dairy products, and lean protein.  Do not eat a lot of foods high in solid fats, added sugars, or salt.  Get regular exercise. This is one of the most important things you can do for your health.  Most adults should exercise for at least 150 minutes each week. The exercise should increase your heart rate and make you sweat (moderate-intensity exercise).  Most adults should also do strengthening exercises at least twice a week. This is in addition to the moderate-intensity exercise.  Maintain a healthy weight  Body mass index (BMI) is a measurement that can be used to identify possible weight problems. It estimates body fat based on height and weight. Your health care provider can help determine your BMI and help you achieve or maintain a healthy weight.  For females 84 years of age and older:   A BMI below  18.5 is considered underweight.  A BMI of 18.5 to 24.9 is normal.  A BMI of 25 to 29.9 is considered overweight.  A BMI of 30 and above is considered obese.  Watch levels of cholesterol and blood lipids  You should start having your blood tested for lipids and cholesterol at 69 years of age, then have this test every 5 years.  You may need to have your cholesterol levels checked more often if:  Your lipid or cholesterol levels are high.  You are older than 69 years of age.  You are at high risk for heart disease.  CANCER SCREENING   Lung Cancer  Lung cancer screening is recommended for adults 83-58 years old who are at high risk for lung cancer because of a history of smoking.  A yearly low-dose CT scan of the lungs is recommended for people who:  Currently smoke.  Have quit within the past 15 years.  Have at least a 30-pack-year history of smoking. A pack year is smoking an average of one pack of cigarettes a day for 1 year.  Yearly screening should continue until it has been 15 years since you quit.  Yearly screening should stop if you develop a health problem that would prevent you from having lung cancer treatment.  Breast Cancer  Practice breast self-awareness. This means understanding how your breasts normally appear and feel.  It also means doing regular breast self-exams. Let your health care provider know about any changes,  no matter how small.  If you are in your 20s or 30s, you should have a clinical breast exam (CBE) by a health care provider every 1-3 years as part of a regular health exam.  If you are 87 or older, have a CBE every year. Also consider having a breast X-ray (mammogram) every year.  If you have a family history of breast cancer, talk to your health care provider about genetic screening.  If you are at high risk for breast cancer, talk to your health care provider about having an MRI and a mammogram every year.  Breast cancer gene (BRCA)  assessment is recommended for women who have family members with BRCA-related cancers. BRCA-related cancers include:  Breast.  Ovarian.  Tubal.  Peritoneal cancers.  Results of the assessment will determine the need for genetic counseling and BRCA1 and BRCA2 testing. Cervical Cancer Routine pelvic examinations to screen for cervical cancer are no longer recommended for nonpregnant women who are considered low risk for cancer of the pelvic organs (ovaries, uterus, and vagina) and who do not have symptoms. A pelvic examination may be necessary if you have symptoms including those associated with pelvic infections. Ask your health care provider if a screening pelvic exam is right for you.   The Pap test is the screening test for cervical cancer for women who are considered at risk.  If you had a hysterectomy for a problem that was not cancer or a condition that could lead to cancer, then you no longer need Pap tests.  If you are older than 65 years, and you have had normal Pap tests for the past 10 years, you no longer need to have Pap tests.  If you have had past treatment for cervical cancer or a condition that could lead to cancer, you need Pap tests and screening for cancer for at least 20 years after your treatment.  If you no longer get a Pap test, assess your risk factors if they change (such as having a new sexual partner). This can affect whether you should start being screened again.  Some women have medical problems that increase their chance of getting cervical cancer. If this is the case for you, your health care provider may recommend more frequent screening and Pap tests.  The human papillomavirus (HPV) test is another test that may be used for cervical cancer screening. The HPV test looks for the virus that can cause cell changes in the cervix. The cells collected during the Pap test can be tested for HPV.  The HPV test can be used to screen women 64 years of age and older.  Getting tested for HPV can extend the interval between normal Pap tests from three to five years.  An HPV test also should be used to screen women of any age who have unclear Pap test results.  After 69 years of age, women should have HPV testing as often as Pap tests.  Colorectal Cancer  This type of cancer can be detected and often prevented.  Routine colorectal cancer screening usually begins at 69 years of age and continues through 69 years of age.  Your health care provider may recommend screening at an earlier age if you have risk factors for colon cancer.  Your health care provider may also recommend using home test kits to check for hidden blood in the stool.  A small camera at the end of a tube can be used to examine your colon directly (sigmoidoscopy or colonoscopy). This  is done to check for the earliest forms of colorectal cancer.  Routine screening usually begins at age 9.  Direct examination of the colon should be repeated every 5-10 years through 69 years of age. However, you may need to be screened more often if early forms of precancerous polyps or small growths are found. Skin Cancer  Check your skin from head to toe regularly.  Tell your health care provider about any new moles or changes in moles, especially if there is a change in a mole's shape or color.  Also tell your health care provider if you have a mole that is larger than the size of a pencil eraser.  Always use sunscreen. Apply sunscreen liberally and repeatedly throughout the day.  Protect yourself by wearing long sleeves, pants, a wide-brimmed hat, and sunglasses whenever you are outside. HEART DISEASE, DIABETES, AND HIGH BLOOD PRESSURE   Have your blood pressure checked at least every 1-2 years. High blood pressure causes heart disease and increases the risk of stroke.  If you are between 19 years and 34 years old, ask your health care provider if you should take aspirin to prevent  strokes.  Have regular diabetes screenings. This involves taking a blood sample to check your fasting blood sugar level.  If you are at a normal weight and have a low risk for diabetes, have this test once every three years after 69 years of age.  If you are overweight and have a high risk for diabetes, consider being tested at a younger age or more often. PREVENTING INFECTION  Hepatitis B  If you have a higher risk for hepatitis B, you should be screened for this virus. You are considered at high risk for hepatitis B if:  You were born in a country where hepatitis B is common. Ask your health care provider which countries are considered high risk.  Your parents were born in a high-risk country, and you have not been immunized against hepatitis B (hepatitis B vaccine).  You have HIV or AIDS.  You use needles to inject street drugs.  You live with someone who has hepatitis B.  You have had sex with someone who has hepatitis B.  You get hemodialysis treatment.  You take certain medicines for conditions, including cancer, organ transplantation, and autoimmune conditions. Hepatitis C  Blood testing is recommended for:  Everyone born from [birth year] through 1966.  Anyone with known risk factors for hepatitis C. Sexually transmitted infections (STIs)  You should be screened for sexually transmitted infections (STIs) including gonorrhea and chlamydia if:  You are sexually active and are younger than 69 years of age.  You are older than 69 years of age and your health care provider tells you that you are at risk for this type of infection.  Your sexual activity has changed since you were last screened and you are at an increased risk for chlamydia or gonorrhea. Ask your health care provider if you are at risk.  If you do not have HIV, but are at risk, it may be recommended that you take a prescription medicine daily to prevent HIV infection. This is called pre-exposure prophylaxis  (PrEP). You are considered at risk if:  You are sexually active and do not regularly use condoms or know the HIV status of your partner(s).  You take drugs by injection.  You are sexually active with a partner who has HIV. Talk with your health care provider about whether you are at high risk of being  infected with HIV. If you choose to begin PrEP, you should first be tested for HIV. You should then be tested every 3 months for as long as you are taking PrEP.  PREGNANCY   If you are premenopausal and you may become pregnant, ask your health care provider about preconception counseling.  If you may become pregnant, take 400 to 800 micrograms (mcg) of folic acid every day.  If you want to prevent pregnancy, talk to your health care provider about birth control (contraception). OSTEOPOROSIS AND MENOPAUSE   Osteoporosis is a disease in which the bones lose minerals and strength with aging. This can result in serious bone fractures. Your risk for osteoporosis can be identified using a bone density scan.  If you are 53 years of age or older, or if you are at risk for osteoporosis and fractures, ask your health care provider if you should be screened.  Ask your health care provider whether you should take a calcium or vitamin D supplement to lower your risk for osteoporosis.  Menopause may have certain physical symptoms and risks.  Hormone replacement therapy may reduce some of these symptoms and risks. Talk to your health care provider about whether hormone replacement therapy is right for you.  HOME CARE INSTRUCTIONS   Schedule regular health, dental, and eye exams.  Stay current with your immunizations.   Do not use any tobacco products including cigarettes, chewing tobacco, or electronic cigarettes.  If you are pregnant, do not drink alcohol.  If you are breastfeeding, limit how much and how often you drink alcohol.  Limit alcohol intake to no more than 1 drink per day for  nonpregnant women. One drink equals 12 ounces of beer, 5 ounces of wine, or 1 ounces of hard liquor.  Do not use street drugs.  Do not share needles.  Ask your health care provider for help if you need support or information about quitting drugs.  Tell your health care provider if you often feel depressed.  Tell your health care provider if you have ever been abused or do not feel safe at home. Document Released: 05/02/2011 Document Revised: 03/03/2014 Document Reviewed: 09/18/2013 [facility] Patient Information 2015 [place], [place]. This information is not intended to replace advice given to you by your health care provider. Make sure you discuss any questions you have with your health care provider.

## 2014-09-24 NOTE — Progress Notes (Signed)
Patient ID: Donna Herrera, female   DOB: 01-18-1946, 68 y.o.   MRN: [id number]   HPI:  Patient presents today for a well woman exam.   Concerns today: see below Periods: post menopausal Contraception: post menopausal Pelvic symptoms: no pelvic pain or vaginal discharge Sexual activity: not sexually active STD Screening: no concerns, declines testing Pap smear status: last pap was about 20 years ago, doesn't want to do this today Exercise: no Diet: tries to eat healthy Smoking: current smoker, wants to quit Alcohol: rare, about 3 times a year Drugs: none Advance directives: would want to be full code  Spots on hands - getting a little better with steroid cream  Needs refill on pain medicine. Would like home health PT to see her as it would make it easier for her to be evaluated. Pain medicine helps her be functional.  Had to stop statin due to muscle cramps and mild CK elevation. Cramps improved after stopping.   ROS: See HPI  [place]:  Cancers in family: son has throat cancer, brother stomach cancer  PHYSICAL EXAM: BP 131/65 mmHg  Pulse 76  Temp(Src) 98.1 F (36.7 C) (Oral)  Ht 5\' 7"  (1.702 m)  Wt 189 lb 11.2 oz (86.047 kg)  BMI 29.70 kg/m2 Gen: NAD, pleasant, cooperative HEENT: NCAT, PERRL, no palpable thyromegaly or anterior cervical lymphadenopathy Heart: RRR, no murmurs Lungs: CTAB, NWOB Abdomen: soft, nontender to palpation Neuro: grossly nonfocal, speech normal  ASSESSMENT/PLAN:  # Health maintenance:  -STD screening: declines today -pap smear: will schedule for a different day -mammogram: recent normal -lipid screening: not due. Had to stop statin due to muscle cramps. -advance directives: pt states she would want to be full code -gave handout on tetanus shot, shingles shot, and colonoscopy -pneumovax & flu given today -encouraged smoking cessation and gave [email or web address] handout -handout given on health maintenance topics  Cramps, muscle,  general Improved after stopping statin. Will plan to recheck CK in a few months.  Right hip pain Refilled norco x 2 months. Will order [facility] PT for patient as I think she will be much more likely to actually have PT eval if we do this.     FOLLOW UP: F/u in 2 months for pain med refills  [place] J. Ardelia Mems, [place]

## 2014-09-29 DIAGNOSIS — Z720 Tobacco use: Secondary | ICD-10-CM | POA: Insufficient documentation

## 2014-09-29 HISTORY — DX: Tobacco use: Z72.0

## 2014-09-29 NOTE — Assessment & Plan Note (Signed)
Improved after stopping statin. Will plan to recheck CK in a few months.

## 2014-09-29 NOTE — Assessment & Plan Note (Signed)
Refilled norco x 2 months. Will order [facility] PT for patient as I think she will be much more likely to actually have PT eval if we do this.

## 2014-11-12 ENCOUNTER — Other Ambulatory Visit: Payer: Self-pay | Admitting: Family Medicine

## 2014-11-12 NOTE — Telephone Encounter (Signed)
Will forward to PCP, Appointment scheduled for 2/15.

## 2014-11-12 NOTE — Telephone Encounter (Signed)
Pt called and needs a refill on her Vicodin left up front for pick up. Please call when ready. jw

## 2014-11-17 NOTE — Telephone Encounter (Signed)
Patient calling back regarding refill on her pain medication.  Need to contact provider to get processed and have call back to patient when ready for pickup.

## 2014-11-18 MED ORDER — HYDROCODONE-ACETAMINOPHEN 5-325 MG PO TABS: 1.0000 | ORAL_TABLET | Freq: Two times a day (BID) | ORAL | Status: DC | PRN

## 2014-11-18 NOTE — Telephone Encounter (Signed)
Left voice message informing pt of message from Dr. Ardelia Mems.  See message below.  Derl Barrow, RN

## 2014-11-18 NOTE — Telephone Encounter (Signed)
Rx printed and signed and will leave at front desk. Please inform patient of the following:  1. We do not refill pain medications outside of clinic appointments. 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 In the future all refills for narcotic refills must take place during an office visit.  2. She will not be able to fill the prescription until 11/23/13 as she received a prescription to be filled in late December.  Thanks, Leeanne Rio, MD

## 2014-12-02 ENCOUNTER — Other Ambulatory Visit: Payer: Self-pay | Admitting: *Deleted

## 2014-12-02 MED ORDER — METOPROLOL SUCCINATE ER 50 MG PO TB24: ORAL_TABLET | ORAL | Status: DC

## 2014-12-15 ENCOUNTER — Ambulatory Visit: Payer: Self-pay | Admitting: Family Medicine

## 2014-12-31 ENCOUNTER — Ambulatory Visit (INDEPENDENT_AMBULATORY_CARE_PROVIDER_SITE_OTHER): Payer: Medicare Other | Admitting: Family Medicine

## 2014-12-31 ENCOUNTER — Encounter: Payer: Self-pay | Admitting: Family Medicine

## 2014-12-31 VITALS — BP 162/88 | HR 75 | Temp 98.4°F | Ht 67.0 in | Wt 187.0 lb

## 2014-12-31 DIAGNOSIS — G8929 Other chronic pain: Secondary | ICD-10-CM

## 2014-12-31 DIAGNOSIS — E785 Hyperlipidemia, unspecified: Secondary | ICD-10-CM | POA: Diagnosis not present

## 2014-12-31 DIAGNOSIS — E559 Vitamin D deficiency, unspecified: Secondary | ICD-10-CM

## 2014-12-31 DIAGNOSIS — I10 Essential (primary) hypertension: Secondary | ICD-10-CM

## 2014-12-31 DIAGNOSIS — L301 Dyshidrosis [pompholyx]: Secondary | ICD-10-CM | POA: Insufficient documentation

## 2014-12-31 DIAGNOSIS — Z7189 Other specified counseling: Secondary | ICD-10-CM | POA: Diagnosis not present

## 2014-12-31 HISTORY — DX: Vitamin D deficiency, unspecified: E55.9

## 2014-12-31 HISTORY — DX: Dyshidrosis (pompholyx): L30.1

## 2014-12-31 MED ORDER — HYDROCODONE-ACETAMINOPHEN 5-325 MG PO TABS: 1.0000 | ORAL_TABLET | Freq: Two times a day (BID) | ORAL | Status: DC | PRN

## 2014-12-31 NOTE — Assessment & Plan Note (Signed)
Still has lots of itching on hands. Recommend she use the triamcinolone ointment scheduled twice a day for 3 weeks.

## 2014-12-31 NOTE — Assessment & Plan Note (Signed)
Pain stable. Our options are really limited as patient still refuses any surgical intervention. I will refill her medicine for 2 months worth. I'm unable to check a urine drug screen today as she has not had this medicine for the last 2 weeks. Follow-up in 2 months for refill. We'll plan to do UDS at that time.

## 2014-12-31 NOTE — Assessment & Plan Note (Signed)
Not currently on any vitamin D supplementation. She previously had a lab value of less than 10. I will recheck her level today. We will supplement as needed based on this value.

## 2014-12-31 NOTE — Assessment & Plan Note (Signed)
BP noted to be elevated today. We forgot to recheck this prior to patient leaving. She has a lot going on. I will have nursing staff call her and ask her to check it at home a couple times a week. She'll need to let us know she consistently has values over 150/90. Otherwise we'll just recheck in 2 months.

## 2014-12-31 NOTE — Assessment & Plan Note (Signed)
Check lipids and CMET today. Not currently on statin as she had significant cramping. I will also recheck her CK today as this was elevated in the past.

## 2014-12-31 NOTE — Progress Notes (Signed)
Patient ID: Donna Herrera, female   DOB: 1946/06/19, 69 y.o.   MRN: [id number]  HPI:  Chronic hip pain: has been a while since last took norco med. Ran out about 2 weeks ago. Pain med does help when she has it. She did have a physical therapy and home visit, they stated she did not need any new equipment or adaptive items for her home.  Hypertension: Denies chest pain or shortness of breath. Currently taking metoprolol XL 50 mg daily.  Dyshidrotic eczema: Has not been using her steroid cream every day. She does have lots of itching on her hands.  ROS: See HPI. Denies chest pain or shortness of breath.  [place]: Chronic right hip pain, chronic headaches, hyperlipidemia  PHYSICAL EXAM: BP 162/88 mmHg  Pulse 75  Temp(Src) 98.4 F (36.9 C) (Oral)  Ht 5\' 7"  (1.702 m)  Wt 187 lb (84.823 kg)  BMI 29.28 kg/m2 Gen: No acute distress, pleasant, cooperative HEENT: Normocephalic, atraumatic Heart: Regular rate and rhythm, no murmur Lungs: Clear to auscultation bilaterally, normal respiratory effort Neuro: Grossly nonfocal, speech normal Ext: Bilateral lower extremities with decreased range of motion with internal and external rotation of bilateral hips. Full strength bilaterally with knee flexion and extension. Skin: Continued rough areas on palms with some hyperpigmentation.  ASSESSMENT/PLAN:  Encounter for chronic pain management Pain stable. Our options are really limited as patient still refuses any surgical intervention. I will refill her medicine for 2 months worth. I'm unable to check a urine drug screen today as she has not had this medicine for the last 2 weeks. Follow-up in 2 months for refill. We'll plan to do UDS at that time.   Hypertension BP noted to be elevated today. We forgot to recheck this prior to patient leaving. She has a lot going on. I will have nursing staff call her and ask her to check it at home a couple times a week. She'll need to let us know she consistently  has values over 150/90. Otherwise we'll just recheck in 2 months.   Dyshidrotic eczema Still has lots of itching on hands. Recommend she use the triamcinolone ointment scheduled twice a day for 3 weeks.   Hyperlipidemia Check lipids and CMET today. Not currently on statin as she had significant cramping. I will also recheck her CK today as this was elevated in the past.   Vitamin D deficiency Not currently on any vitamin D supplementation. She previously had a lab value of less than 10. I will recheck her level today. We will supplement as needed based on this value.    FOLLOW UP: F/u in 2 months for chronic pain.  [place]. Ardelia Mems, [place]

## 2014-12-31 NOTE — Patient Instructions (Signed)
I refilled your pain medicine for 2 months We're checking blood work today: Your liver, kidneys, vitamin D, and muscle enzymes Uses steroid cream on your hands twice a day for 2-3 weeks Follow-up with me in 2 months  Be well, Dr. Ardelia Mems

## 2015-01-01 LAB — COMPREHENSIVE METABOLIC PANEL
ALBUMIN: 4.1 g/dL (ref 3.5–5.2)
ALK PHOS: 111 U/L (ref 39–117)
ALT: 11 U/L (ref 0–35)
AST: 16 U/L (ref 0–37)
BUN: 10 mg/dL (ref 6–23)
CO2: 26 mEq/L (ref 19–32)
Calcium: 9 mg/dL (ref 8.4–10.5)
Chloride: 103 mEq/L (ref 96–112)
Creat: 0.61 mg/dL (ref 0.50–1.10)
Glucose, Bld: 96 mg/dL (ref 70–99)
Potassium: 3.4 mEq/L — ABNORMAL LOW (ref 3.5–5.3)
Sodium: 142 mEq/L (ref 135–145)
TOTAL PROTEIN: 7.3 g/dL (ref 6.0–8.3)
Total Bilirubin: 0.6 mg/dL (ref 0.2–1.2)

## 2015-01-01 LAB — VITAMIN D 25 HYDROXY (VIT D DEFICIENCY, FRACTURES): Vit D, 25-Hydroxy: 11 ng/mL — ABNORMAL LOW (ref 30–100)

## 2015-01-01 LAB — LIPID PANEL
CHOL/HDL RATIO: 4.1 ratio
Cholesterol: 211 mg/dL — ABNORMAL HIGH (ref 0–200)
HDL: 51 mg/dL (ref >=46)
LDL CALC: 140 mg/dL — AB (ref 0–99)
TRIGLYCERIDES: 99 mg/dL (ref <150)
VLDL: 20 mg/dL (ref 0–40)

## 2015-01-01 LAB — CK: Total CK: 282 U/L — ABNORMAL HIGH (ref 7–177)

## 2015-01-07 ENCOUNTER — Telehealth: Payer: Self-pay | Admitting: Family Medicine

## 2015-01-07 MED ORDER — CHOLECALCIFEROL 1.25 MG (50000 UT) PO TABS: 1.0000 | ORAL_TABLET | ORAL | Status: DC

## 2015-01-07 NOTE — Telephone Encounter (Signed)
Please inform patient of the following:  1. Her potassium was mildly low. I recommend she eat potassium rich foods like bananas. 2. Her vitamin D level was very low. I recommend she take vitamin D replacement pills. One pill weekly for 8 weeks. I sent in a prescription for her. After the 8 weeks, we'll need to do a daily vitamin D pill at a lower dose (I'll prescribe this at a future office visit).  3. Muscle enzyme (CK) remains elevated. This could be due to the vitamin D. We'll recheck it once her Vit D levels are better.  Thanks, Leeanne Rio, MD

## 2015-01-12 NOTE — Telephone Encounter (Signed)
Patient informed, expressed understanding. Patient also states she has been having a lot of swelling lately. Has appointment scheduled for Wednesday.

## 2015-01-14 ENCOUNTER — Encounter: Payer: Self-pay | Admitting: Family Medicine

## 2015-01-14 ENCOUNTER — Ambulatory Visit (INDEPENDENT_AMBULATORY_CARE_PROVIDER_SITE_OTHER): Payer: Medicare Other | Admitting: Family Medicine

## 2015-01-14 ENCOUNTER — Ambulatory Visit (HOSPITAL_COMMUNITY)
Admission: RE | Admit: 2015-01-14 | Discharge: 2015-01-14 | Disposition: A | Discharge status: Home / Self Care | Payer: Medicare Other | Source: Ambulatory Visit | Attending: Family Medicine | Admitting: Family Medicine

## 2015-01-14 VITALS — BP 174/84 | HR 81 | Temp 98.4°F | Ht 67.0 in | Wt 190.3 lb

## 2015-01-14 DIAGNOSIS — R0602 Shortness of breath: Secondary | ICD-10-CM

## 2015-01-14 DIAGNOSIS — R0600 Dyspnea, unspecified: Secondary | ICD-10-CM

## 2015-01-14 LAB — BASIC METABOLIC PANEL
BUN: 11 mg/dL (ref 6–23)
CHLORIDE: 104 meq/L (ref 96–112)
CO2: 24 meq/L (ref 19–32)
Calcium: 8.8 mg/dL (ref 8.4–10.5)
Creat: 0.62 mg/dL (ref 0.50–1.10)
GLUCOSE: 85 mg/dL (ref 70–99)
Potassium: 3.6 mEq/L (ref 3.5–5.3)
SODIUM: 140 meq/L (ref 135–145)

## 2015-01-14 LAB — CBC
HCT: 38.2 % (ref 36.0–46.0)
HEMOGLOBIN: 12.3 g/dL (ref 12.0–15.0)
MCH: 27.9 pg (ref 26.0–34.0)
MCHC: 32.2 g/dL (ref 30.0–36.0)
MCV: 86.6 fL (ref 78.0–100.0)
MPV: 11.4 fL (ref 8.6–12.4)
Platelets: 255 10*3/uL (ref 150–400)
RBC: 4.41 MIL/uL (ref 3.87–5.11)
RDW: 14 % (ref 11.5–15.5)
WBC: 4.6 10*3/uL (ref 4.0–10.5)

## 2015-01-14 MED ORDER — FUROSEMIDE 20 MG PO TABS: 20.0000 mg | ORAL_TABLET | Freq: Every day | ORAL | Status: DC

## 2015-01-14 NOTE — Patient Instructions (Signed)
Take Lasix 20 mg daily. Follow-up in one week to be reexamined. We are checking an ultrasound of your heart and chest x-ray. We are also checking some blood work. In the meantime, if you start feeling worse or are not getting better, come back to the clinic. If you start feeling a lot worse with shortness of breath or chest pain, go to the emergency room.  Be well, Dr. Ardelia Mems

## 2015-01-14 NOTE — Progress Notes (Signed)
Patient ID: Donna Herrera, female   DOB: 07/04/1946, 69 y.o.   MRN: [id number]  HPI:  Patient presents to discuss difficulty breathing.  Patient reports that for the last couple of weeks she has felt short of breath. Has coughed some, but complains more shortness of breath and cough. Initially she was coughing up phlegm. This is back down a little. She feels like she has swelling in her legs. Also feels like her chest is congested with a vague lump or knot. The discomfort in her chest improves when she walks around. It also improves if she stretches. She has much greater shortness of breath whenever she lays down, but it is not worse when she walks. She think she's been gaining weight. No fever. She took some of her sister's Lasix over the weekend and it improved her swelling some.  ROS: See HPI.  [place]: History DJD, chronic hip pain, GERD, hypertension, hyperlipidemia, vitamin D deficiency  PHYSICAL EXAM: BP 174/84 mmHg  Pulse 81  Temp(Src) 98.4 F (36.9 C) (Oral)  Ht 5\' 7"  (1.702 m)  Wt 190 lb 4.8 oz (86.32 kg)  BMI 29.80 kg/m2  SpO2 98% Gen: No acute distress, pleasant, cooperative HEENT: Normocephalic, atraumatic, oropharynx clear and moist Heart: Regular rate and rhythm, no murmurs Lungs: Clear to auscultation bilaterally, normal respiratory effort, no crackles or wheezes Neuro: Grossly nonfocal, speech normal Ext: Bilateral lower extremities with 2+ edema. Symmetric. No erythema or tenderness to palpation.  ASSESSMENT/PLAN:  Dyspnea Broad differential, but with swelling and dyspnea in a patient with a history of hypertension, of greatest concern would be congestive heart failure. I strongly doubt pulmonary embolism at this time as she is not tachycardic, not hypoxic, and has symmetric leg edema. EKG done in our office today and is nonischemic. Patient is in no distress and is safe for management in the outpatient setting.  Plan: -Check labs: CBC, BMET, BNP -Chest  x-ray -Echocardiogram -Presumptively treat with Lasix 20 mg daily -Follow-up in one week to review results and see how she's doing -Discussed at length reasons to go to the emergency room in the interim.    FOLLOW UP: F/u in 1 week for dyspnea  [place] J. Ardelia Mems, [place]

## 2015-01-15 ENCOUNTER — Encounter: Payer: Self-pay | Admitting: Family Medicine

## 2015-01-15 LAB — BRAIN NATRIURETIC PEPTIDE: Brain Natriuretic Peptide: 355 pg/mL — ABNORMAL HIGH (ref 0.0–100.0)

## 2015-01-16 ENCOUNTER — Other Ambulatory Visit (HOSPITAL_COMMUNITY): Payer: Medicare Other

## 2015-01-16 ENCOUNTER — Ambulatory Visit (HOSPITAL_COMMUNITY)
Admission: RE | Admit: 2015-01-16 | Discharge: 2015-01-16 | Disposition: A | Discharge status: Home / Self Care | Payer: Medicare Other | Source: Ambulatory Visit | Attending: Family Medicine | Admitting: Family Medicine

## 2015-01-16 DIAGNOSIS — R0602 Shortness of breath: Secondary | ICD-10-CM | POA: Insufficient documentation

## 2015-01-16 DIAGNOSIS — R0600 Dyspnea, unspecified: Secondary | ICD-10-CM | POA: Diagnosis not present

## 2015-01-16 NOTE — Assessment & Plan Note (Signed)
Broad differential, but with swelling and dyspnea in a patient with a history of hypertension, of greatest concern would be congestive heart failure. I strongly doubt pulmonary embolism at this time as she is not tachycardic, not hypoxic, and has symmetric leg edema. EKG done in our office today and is nonischemic. Patient is in no distress and is safe for management in the outpatient setting.  Plan: -Check labs: CBC, BMET, BNP -Chest x-ray -Echocardiogram -Presumptively treat with Lasix 20 mg daily -Follow-up in one week to review results and see how she's doing -Discussed at length reasons to go to the emergency room in the interim.

## 2015-01-16 NOTE — Progress Notes (Signed)
  Echocardiogram 2D Echocardiogram has been performed.  Donna Herrera 01/16/2015, 12:27 PM

## 2015-01-21 ENCOUNTER — Ambulatory Visit (INDEPENDENT_AMBULATORY_CARE_PROVIDER_SITE_OTHER): Payer: Medicare Other | Admitting: Family Medicine

## 2015-01-21 VITALS — BP 156/86 | HR 60 | Temp 98.7°F | Wt 187.0 lb

## 2015-01-21 DIAGNOSIS — I5023 Acute on chronic systolic (congestive) heart failure: Secondary | ICD-10-CM

## 2015-01-21 DIAGNOSIS — I5021 Acute systolic (congestive) heart failure: Secondary | ICD-10-CM

## 2015-01-21 LAB — BASIC METABOLIC PANEL
BUN: 13 mg/dL (ref 6–23)
CHLORIDE: 105 meq/L (ref 96–112)
CO2: 24 meq/L (ref 19–32)
Calcium: 9.2 mg/dL (ref 8.4–10.5)
Creat: 0.7 mg/dL (ref 0.50–1.10)
Glucose, Bld: 74 mg/dL (ref 70–99)
Potassium: 3.9 mEq/L (ref 3.5–5.3)
Sodium: 139 mEq/L (ref 135–145)

## 2015-01-21 NOTE — Patient Instructions (Signed)
Keep doing lasix 20mg  daily Follow up with me in 2 weeks to recheck BP and decide on other medicines Rechecking kidney function today  Be well, Dr. Ardelia Mems

## 2015-01-22 ENCOUNTER — Encounter: Payer: Self-pay | Admitting: Family Medicine

## 2015-01-22 ENCOUNTER — Other Ambulatory Visit: Payer: Self-pay | Admitting: Family Medicine

## 2015-01-22 DIAGNOSIS — I5023 Acute on chronic systolic (congestive) heart failure: Secondary | ICD-10-CM | POA: Insufficient documentation

## 2015-01-22 MED ORDER — FUROSEMIDE 20 MG PO TABS: 20.0000 mg | ORAL_TABLET | Freq: Every day | ORAL | Status: DC

## 2015-01-22 NOTE — Telephone Encounter (Signed)
I dont see where a rx was sent in after last ov, will forward to PCP.

## 2015-01-22 NOTE — Telephone Encounter (Signed)
I meant to call this in. I sent in a new rx. Please inform patient.  Leeanne Rio, MD

## 2015-01-22 NOTE — Progress Notes (Signed)
Patient ID: Donna Herrera, female   DOB: 01-Aug-1946, 69 y.o.   MRN: [id number]  HPI:  F/u SOB: had echo done which confirmed reduced EF of 40%. Pt has been taking lasix 20mg  daily. Has had good urinary output. Feels much better, back to baseline. Did not take lasix this morning and noticed her swelling seemed to return somewhat. No chest pain. Did not get CXR. She has had some muscle cramps, wonders if her K is low.  ROS: See HPI.  [place]: HTN, DJD, chronic narcotic use, HLD, vit D deficiency, systolic CHF  PHYSICAL EXAM: BP 156/86 mmHg  Pulse 60  Temp(Src) 98.7 F (37.1 C) (Oral)  Wt 187 lb (84.823 kg) Gen: NAD HEENT: NCAT Heart: RRR no murmurs Lungs: CTAB NWOB Neuro: grossly nonfocal speech normal Ext: minimal 1+ edema bilat lower ext  ASSESSMENT/PLAN:  Systolic CHF Breathing and swelling improved with diuresis. Will check BMET today. Continue lasix 20mg  daily for now. F/u in 2-3 weeks, will need to try and add ACE inhibitor given proven mortality benefit in CHF.     FOLLOW UP: F/u in 2 weeks for CHF  Donna Herrera, [place]

## 2015-01-22 NOTE — Telephone Encounter (Signed)
Pt called and wanted to know if the doctor had called in her fluid pills. jw

## 2015-01-24 NOTE — Assessment & Plan Note (Signed)
Breathing and swelling improved with diuresis. Will check BMET today. Continue lasix 20mg  daily for now. F/u in 2-3 weeks, will need to try and add ACE inhibitor given proven mortality benefit in CHF.

## 2015-01-26 NOTE — Telephone Encounter (Signed)
Left message on voicemail informing patient.  

## 2015-02-05 ENCOUNTER — Ambulatory Visit (INDEPENDENT_AMBULATORY_CARE_PROVIDER_SITE_OTHER): Payer: Medicare Other | Admitting: Family Medicine

## 2015-02-05 ENCOUNTER — Encounter: Payer: Self-pay | Admitting: Family Medicine

## 2015-02-05 VITALS — BP 157/91 | HR 79 | Temp 98.1°F | Ht 67.0 in | Wt 184.1 lb

## 2015-02-05 DIAGNOSIS — I5022 Chronic systolic (congestive) heart failure: Secondary | ICD-10-CM | POA: Diagnosis not present

## 2015-02-05 MED ORDER — LISINOPRIL 10 MG PO TABS: 10.0000 mg | ORAL_TABLET | Freq: Every day | ORAL | Status: DC

## 2015-02-05 MED ORDER — FUROSEMIDE 20 MG PO TABS: 20.0000 mg | ORAL_TABLET | Freq: Every day | ORAL | Status: DC

## 2015-02-05 NOTE — Assessment & Plan Note (Signed)
Now that she is out of the acute/decompensated phase, need to add ACE for optimal therapy. No contraindications (no hx of angioedema). Start lisinopril 10 mg daily F/u 1 week for recheck BMET lab appt F/u with me in 1 mo to see how she's feeling

## 2015-02-05 NOTE — Progress Notes (Signed)
Patient ID: Donna Herrera, female   DOB: 03-22-1946, 69 y.o.   MRN: [id number]  HPI:  F/u systolic heart failure: taking lasix 20mg  daily. Tolerating this well. It helps her swelling. Breathing is doing okay. Does have significant fatigue. Also some difficulty sleeping, not due to breathing.  ROS: See HPI.  [place]: hx HTN, reduced EF CHF, denies hx of angioedema  PHYSICAL EXAM: BP 157/91 mmHg  Pulse 79  Temp(Src) 98.1 F (36.7 C) (Oral)  Ht 5\' 7"  (1.702 m)  Wt 184 lb 1.6 oz (83.507 kg)  BMI 28.83 kg/m2 Gen: NAD HEENT: NCAT Heart: RRR Lungs: NWOB, coarse crackles bilat lower lobes Neuro: grossly nonfocal speech normal Ext: trace to 1+ edema bilat lower ext  ASSESSMENT/PLAN:  Systolic CHF Now that she is out of the acute/decompensated phase, need to add ACE for optimal therapy. No contraindications (no hx of angioedema). Start lisinopril 10 mg daily F/u 1 week for recheck BMET lab appt F/u with me in 1 mo to see how she's feeling    FOLLOW UP: F/u in 1 mo for CHF Lab appt in 1 week  [place] J. Ardelia Mems, [place]

## 2015-02-05 NOTE — Patient Instructions (Signed)
Start lisinopril 10 mg daily. Come back in one week for a lab appointment to recheck your kidney function.  Follow-up with me in one month. Call with any questions.  Be well, Dr. Ardelia Mems

## 2015-02-19 ENCOUNTER — Other Ambulatory Visit: Payer: Medicare Other

## 2015-03-19 ENCOUNTER — Ambulatory Visit (INDEPENDENT_AMBULATORY_CARE_PROVIDER_SITE_OTHER): Payer: Medicare Other | Admitting: Family Medicine

## 2015-03-19 ENCOUNTER — Encounter: Payer: Self-pay | Admitting: Family Medicine

## 2015-03-19 VITALS — Temp 98.0°F | Ht 67.0 in | Wt 185.7 lb

## 2015-03-19 DIAGNOSIS — G8929 Other chronic pain: Secondary | ICD-10-CM | POA: Diagnosis not present

## 2015-03-19 DIAGNOSIS — Z7189 Other specified counseling: Secondary | ICD-10-CM

## 2015-03-19 DIAGNOSIS — E559 Vitamin D deficiency, unspecified: Secondary | ICD-10-CM

## 2015-03-19 DIAGNOSIS — R208 Other disturbances of skin sensation: Secondary | ICD-10-CM

## 2015-03-19 DIAGNOSIS — G629 Polyneuropathy, unspecified: Secondary | ICD-10-CM

## 2015-03-19 DIAGNOSIS — F1190 Opioid use, unspecified, uncomplicated: Secondary | ICD-10-CM | POA: Diagnosis not present

## 2015-03-19 DIAGNOSIS — R200 Anesthesia of skin: Secondary | ICD-10-CM

## 2015-03-19 DIAGNOSIS — I5022 Chronic systolic (congestive) heart failure: Secondary | ICD-10-CM

## 2015-03-19 MED ORDER — HYDROCODONE-ACETAMINOPHEN 5-325 MG PO TABS: 1.0000 | ORAL_TABLET | Freq: Two times a day (BID) | ORAL | Status: DC | PRN

## 2015-03-19 NOTE — Patient Instructions (Addendum)
Refilled pain medicine Urine drug screen today  Restart lisinopril Come back in 1 week to get bloodwork drawn - schedule appointment at the front desk  See me in 1 month  Be well, Dr. Ardelia Mems

## 2015-03-20 ENCOUNTER — Other Ambulatory Visit: Payer: Self-pay | Admitting: Family Medicine

## 2015-03-20 DIAGNOSIS — R200 Anesthesia of skin: Secondary | ICD-10-CM | POA: Insufficient documentation

## 2015-03-20 LAB — DRUG SCR UR, PAIN MGMT, REFLEX CONF
Amphetamine Screen, Ur: NEGATIVE
Barbiturate Quant, Ur: NEGATIVE
Benzodiazepines.: NEGATIVE
Cocaine Metabolites: NEGATIVE
Creatinine,U: 187.07 mg/dL
MARIJUANA METABOLITE: NEGATIVE
METHADONE: NEGATIVE
OPIATES: NEGATIVE
PROPOXYPHENE: NEGATIVE
Phencyclidine (PCP): NEGATIVE

## 2015-03-20 MED ORDER — LISINOPRIL 10 MG PO TABS: 10.0000 mg | ORAL_TABLET | Freq: Every day | ORAL | Status: DC

## 2015-03-20 NOTE — Assessment & Plan Note (Signed)
Good strength on exam. No abnormalities other than chronic hyperpigmented rash on hands, which has in the past been treated for dyshidrotic eczema. She has sensation that is intact, it just feels different in her fingertips. She is following up in one week for lab visit at which time I'll check vitamin B-12, vitamin D, and CK levels. If numbness persists could consider referral for nerve conduction studies.

## 2015-03-20 NOTE — Assessment & Plan Note (Addendum)
Recheck vitamin D levels when patient returns for lab visit in one week. Will also recheck CK levels at that time. These have been elevated in the past.

## 2015-03-20 NOTE — Assessment & Plan Note (Addendum)
Tolerated lisinopril well, but ran out of that because she did not follow-up for her required BMET. She is agreeable to restarting this medication and following up in one week for a BMET. Orders entered. She will follow-up with me in one month. She can continue with Lasix every other day as long as her volume status remains good. It appears good today.

## 2015-03-20 NOTE — Assessment & Plan Note (Signed)
Patient request increasing quantity per month. She would like to be available to take her hydrocodone 5 mg pills twice a day. She has objective findings that would cause her to have significant chronic pain. I think this is reasonable. She's never given any indication that she is misusing this medication. Will obtain UDS today. Anticipate that it will be negative as she has been out of her hydrocodone for a while. I will prescribe #60 pills per month. She will follow-up with me for this in 3 months. Given 3 month supply today.

## 2015-03-20 NOTE — Progress Notes (Signed)
Patient ID: Donna Herrera, female   DOB: Sep 16, 1946, 69 y.o.   MRN: [id number]  HPI:  Follow-up CHF: Recently started on lisinopril. Patient ran out of this prescription after getting only 30 day supply. We did not refill it she had not followed up for a BMET after starting this medication. She reports that her breathing is doing well. She takes Lasix every other day, as when she takes that every day she gets cramping in her legs. Swelling has also improved.  Chronic pain: Has been out of hydrocodone for some period of time. She prefers an amount that would allow her to take it twice a day. She wants to take one pill in the daytime if needed and one pill at night if needed.   Hand numbness: Has noticed that her hands are numb for the past several weeks. This happens just on her fingertips. She is able to feel, it just feels different.  ROS: See HPI.  [place]:  History of hypertension, systolic CHF, GERD, degenerative joint disease, dyshidrotic eczema , chronic pain  PHYSICAL EXAM: Temp(Src) 98 F (36.7 C) (Oral)  Ht 5\' 7"  (1.702 m)  Wt 185 lb 11.2 oz (84.233 kg)  BMI 29.08 kg/m2 Gen: no acute distress, pleasant, cooperative HEENT: normocephalic, atraumatic Heart: regular rate and rhythm, no murmur Lungs: normal respiratory effort. Speaks in full sentences without any distress. Some coarse crackles at bilateral bases. Otherwise clear. Neuro: grossly nonfocal, speech normal Ext: 1+ edema bilateral lower extremities. Hands with hyperpigmented rash on palmar surface. Good sensation in fingertips. 2+ radial pulses bilaterally. Full grip strength bilaterally.  ASSESSMENT/PLAN:  Vitamin D deficiency  Recheck vitamin D levels when patient returns for lab visit in one week. Will also recheck CK levels at that time. These have been elevated in the past.   Encounter for chronic pain management  Patient request increasing quantity per month. She would like to be available to take her  hydrocodone 5 mg pills twice a day. She has objective findings that would cause her to have significant chronic pain. I think this is reasonable. She's never given any indication that she is misusing this medication. Will obtain UDS today. Anticipate that it will be negative as she has been out of her hydrocodone for a while. I will prescribe #60 pills per month. She will follow-up with me for this in 3 months. Given 3 month supply today.   Systolic CHF  Tolerated lisinopril well, but ran out of that because she did not follow-up for her required BMET. She is agreeable to restarting this medication and following up in one week for a BMET. Orders entered. She will follow-up with me in one month. She can continue with Lasix every other day as long as her volume status remains good. It appears good today.   Numbness of fingers of both hands Good strength on exam. No abnormalities other than chronic hyperpigmented rash on hands, which has in the past been treated for dyshidrotic eczema. She has sensation that is intact, it just feels different in her fingertips. She is following up in one week for lab visit at which time I'll check vitamin B-12, vitamin D, and CK levels. If numbness persists could consider referral for nerve conduction studies.    FOLLOW UP: F/u in one month for above issues Next pain medication refill in 3 months Return for lab visit in one week  [place] J. Ardelia Mems, [place]

## 2015-04-01 ENCOUNTER — Telehealth: Payer: Self-pay | Admitting: Family Medicine

## 2015-04-01 NOTE — Telephone Encounter (Signed)
Pt informed and scheduled for an earlier appt. Deseree Kennon Holter, CMA

## 2015-04-01 NOTE — Telephone Encounter (Signed)
Received following message from [place]. Pt's labs have already been ordered but I would like for her to go ahead and come in sooner for a lab visit. I saw her on 5/19 and asked her to return in 1 week for labs.  Red team can you call her and see if we can get her lab appt to be sooner than currently scheduled? (currently scheduled for 1 mo out from her last appt)..  Thanks, Leeanne Rio, MD   ===View-only below this line===  ----- Message -----    From: Rulon Sera    Sent: 04/01/2015   1:45 PM      To: Leeanne Rio, MD Subject: Lab orders                                     Patient made an appt to see you, says she is supposed to come in for labs before the appt, just wanted to be sure there are lab orders and you are aware of her having labs done. She did not know what the labs were for.  Thanks, Junie Panning

## 2015-04-02 ENCOUNTER — Other Ambulatory Visit: Payer: Medicare Other

## 2015-04-02 DIAGNOSIS — G629 Polyneuropathy, unspecified: Secondary | ICD-10-CM | POA: Diagnosis not present

## 2015-04-02 DIAGNOSIS — E559 Vitamin D deficiency, unspecified: Secondary | ICD-10-CM | POA: Diagnosis not present

## 2015-04-02 LAB — BASIC METABOLIC PANEL
BUN: 13 mg/dL (ref 6–23)
CO2: 26 mEq/L (ref 19–32)
Calcium: 8.6 mg/dL (ref 8.4–10.5)
Chloride: 105 mEq/L (ref 96–112)
Creat: 0.64 mg/dL (ref 0.50–1.10)
GLUCOSE: 90 mg/dL (ref 70–99)
POTASSIUM: 3.5 meq/L (ref 3.5–5.3)
SODIUM: 140 meq/L (ref 135–145)

## 2015-04-02 LAB — CK: Total CK: 270 U/L — ABNORMAL HIGH (ref 7–177)

## 2015-04-02 LAB — VITAMIN B12: Vitamin B-12: 438 pg/mL (ref 211–911)

## 2015-04-02 NOTE — Progress Notes (Signed)
BMP,VIT D, B12 AND CK DONE TODAY Donna Herrera

## 2015-04-03 ENCOUNTER — Other Ambulatory Visit: Payer: Self-pay | Admitting: Family Medicine

## 2015-04-03 ENCOUNTER — Encounter: Payer: Self-pay | Admitting: Family Medicine

## 2015-04-03 LAB — VITAMIN D 25 HYDROXY (VIT D DEFICIENCY, FRACTURES): VIT D 25 HYDROXY: 26 ng/mL — AB (ref 30–100)

## 2015-04-03 MED ORDER — CHOLECALCIFEROL 1.25 MG (50000 UT) PO TABS: 1.0000 | ORAL_TABLET | ORAL | Status: DC

## 2015-04-03 NOTE — Progress Notes (Signed)
Addendum for billing purposes: UDS obtained for diagnosis chronic pain (G89.29).  Leeanne Rio, MD

## 2015-04-07 ENCOUNTER — Other Ambulatory Visit: Payer: Self-pay | Admitting: Family Medicine

## 2015-04-09 ENCOUNTER — Other Ambulatory Visit: Payer: Self-pay | Admitting: Family Medicine

## 2015-04-09 NOTE — Telephone Encounter (Signed)
Just filled this yesterday. Due for appt to discuss HTN. Hilton Sinclair, MD

## 2015-04-14 ENCOUNTER — Other Ambulatory Visit: Payer: Medicare Other

## 2015-04-16 ENCOUNTER — Encounter: Payer: Self-pay | Admitting: Family Medicine

## 2015-04-16 ENCOUNTER — Ambulatory Visit (INDEPENDENT_AMBULATORY_CARE_PROVIDER_SITE_OTHER): Payer: Medicare Other | Admitting: Family Medicine

## 2015-04-16 ENCOUNTER — Ambulatory Visit: Payer: Medicare Other | Admitting: Family Medicine

## 2015-04-16 VITALS — BP 148/88 | HR 70 | Temp 98.4°F | Ht 68.0 in | Wt 185.8 lb

## 2015-04-16 DIAGNOSIS — R200 Anesthesia of skin: Secondary | ICD-10-CM

## 2015-04-16 DIAGNOSIS — R208 Other disturbances of skin sensation: Secondary | ICD-10-CM

## 2015-04-16 DIAGNOSIS — E559 Vitamin D deficiency, unspecified: Secondary | ICD-10-CM

## 2015-04-16 DIAGNOSIS — I5022 Chronic systolic (congestive) heart failure: Secondary | ICD-10-CM | POA: Diagnosis not present

## 2015-04-16 MED ORDER — ASPIRIN 81 MG PO TABS: 81.0000 mg | ORAL_TABLET | Freq: Every day | ORAL | Status: DC

## 2015-04-16 NOTE — Progress Notes (Signed)
Patient ID: Donna Herrera, female   DOB: 1946/10/30, 69 y.o.   MRN: [id number]  HPI:  URI sx's: has had sx's since yesterday. Feels headache, facial congestion, sore throat, and cough. No sick contacts. No fevers.  Hands continue to have numbness frequently. Recent neuropathy labs unremarkable.   CHF: tolerating lisinopril. Denies CP or SOB. Swelling does well with lasix every other day. Does not take aspirin currently.  ROS: See HPI.  [place]: systolic CHF, chronic pain, HTN, HLD  PHYSICAL EXAM: BP 148/88 mmHg  Pulse 70  Temp(Src) 98.4 F (36.9 C) (Oral)  Ht 5\' 8"  (1.727 m)  Wt 185 lb 12.8 oz (84.278 kg)  BMI 28.26 kg/m2 Gen: NAD HEENT: NCAT Heart: RRR no murmur Lungs: CTAB NWOB Neuro: grossly nonfocal speech normal Ext: 1+ edema bilat, improved, hands with full grip strenght bilat  ASSESSMENT/PLAN:  Numbness of fingers of both hands Continues to c/o numbness. Recent labwork unremarkable. Will refer to neurology for further eval. May benefit from nerve conduction study.  Systolic CHF Continue ACE, beta blocker, lasix. Add aspirin 81mg  daily. Volume status good. F/u in 2 mos.  Vitamin D deficiency Recent vit D level low, continue weekly repletion.   URI sx's: suspect viral. No signs of bacterial superinfection. - recommend nasal saline spray, cough drops prn - plenty of fluids - f/u if not improving.   FOLLOW UP: F/u in 2 mos for CHF and chronic pain Referring to neurology  [place] J. Ardelia Mems, [place]

## 2015-04-16 NOTE — Patient Instructions (Signed)
Start aspirin 81mg  daily Return in 2 months to recheck BP & see how things are going Use nasal saline spray, cough drops for cold Return if fevers or worsening. Suspect you will get better in a few days. I am referring you to neurology for your numbness. You will get a phone call to schedule this appointment.   Be well, Dr. Ardelia Mems

## 2015-04-18 NOTE — Assessment & Plan Note (Signed)
Recent vit D level low, continue weekly repletion.

## 2015-04-18 NOTE — Assessment & Plan Note (Signed)
Continue ACE, beta blocker, lasix. Add aspirin 81mg  daily. Volume status good. F/u in 2 mos.

## 2015-04-18 NOTE — Assessment & Plan Note (Signed)
Continues to c/o numbness. Recent labwork unremarkable. Will refer to neurology for further eval. May benefit from nerve conduction study.

## 2015-04-24 NOTE — Progress Notes (Signed)
Update for billing purposes: Vitamin D ordered for E55.9 (vitamin D deficiency) B12 ordered for 782.0 (disorder of skin sensation)  Leeanne Rio, MD

## 2015-05-04 ENCOUNTER — Other Ambulatory Visit: Payer: Self-pay | Admitting: Family Medicine

## 2015-05-05 ENCOUNTER — Other Ambulatory Visit: Payer: Self-pay | Admitting: Family Medicine

## 2015-05-18 ENCOUNTER — Other Ambulatory Visit: Payer: Self-pay | Admitting: Family Medicine

## 2015-05-20 ENCOUNTER — Other Ambulatory Visit: Payer: Self-pay | Admitting: Family Medicine

## 2015-05-28 ENCOUNTER — Encounter: Payer: Self-pay | Admitting: Family Medicine

## 2015-05-28 ENCOUNTER — Ambulatory Visit (INDEPENDENT_AMBULATORY_CARE_PROVIDER_SITE_OTHER): Payer: Medicare Other | Admitting: Family Medicine

## 2015-05-28 VITALS — BP 149/68 | HR 65 | Temp 98.5°F | Ht 68.0 in | Wt 182.9 lb

## 2015-05-28 DIAGNOSIS — M25512 Pain in left shoulder: Secondary | ICD-10-CM | POA: Diagnosis not present

## 2015-05-28 MED ORDER — TRAMADOL-ACETAMINOPHEN 37.5-325 MG PO TABS: 1.0000 | ORAL_TABLET | Freq: Four times a day (QID) | ORAL | Status: DC | PRN

## 2015-05-28 NOTE — Patient Instructions (Signed)
It was a pleasure seeing you today in our clinic. Today we discussed your pain. Here is the treatment plan we have discussed and agreed upon together:   - I have written a prescription for Ultracet. I hope this will help with your pain and inflammation. - I would say to avoid taking Aleve and other NSAIDs. Taking one tablet every other week for inflammation might be ok, but I would still actively avoid this. - I'd like to see you back in 1 month.

## 2015-05-28 NOTE — Progress Notes (Signed)
   HPI  CC: Hip and shoulder pain Patient is here for her hip and shoulder pain. Shoulder pain is bothering her the most. She is S/P multiple rotator cuff repair (each bilaterally). S/P total hip arthroscopy in 1987. Pain Lt>Rt. Pain is achy in nature. No radiation, no edema/effusions. No recent trauma. No weakness or numbness. Patient has Norco for this pain but doesn't like taking these 2/2 the sedating effects. She would like to take an NSAID for this, but was told she could not 2/2 her kidney function. She would like something specifically for the inflammation.  ROS: denied fevers, chills, trauma, swelling, new reduced ROM, weakness, numbness, SOB, CP, jaw pain, palpitations. All other systems reviewed and are negative.  Past medical history and social history reviewed and updated in the EMR as appropriate.  Objective: BP 149/68 mmHg  Pulse 65  Temp(Src) 98.5 F (36.9 C) (Oral)  Ht 5\' 8"  (1.727 m)  Wt 182 lb 14.4 oz (82.963 kg)  BMI 27.82 kg/m2 Gen: NAD, alert, cooperative, elderly CV: RRR, no murmur Resp: CTAB, no wheezes, non-labored Ext: No edema, warm; limited ROM bilat UE. Strength 5/5, tenderness diffuse, external rotation limited the most bilaterally. yergisons and speeds neg. No tenderness in bicepital groove bilat.  Assessment and plan:  Left shoulder pain Patient with long history of orthopaedic issues. Hx of multiple RCRs puts patient at high risk for OA. Pain c/w OA etiology. Antiinflammatory in the form of an NSAID would be ideal but not w/ patient's worsening renal function. - Ultracet prescription written >> pain relief and (hopeful) antiinflammatory effects from acetaminophen, w/ less sedating effects. - Patient was VERY adamant about her desire to take Aleve. I urged against this. HOWEVER, I asked that if she did take naproxen than to take it sparingly and to try her best to only use this once every other week.  Note: Track Renal Function regularly Note: if  Ultracet is ineffective >> inquire about steroid injection.   Meds ordered this encounter  Medications  . traMADol-acetaminophen (ULTRACET) 37.5-325 MG per tablet    Sig: Take 1 tablet by mouth every 6 (six) hours as needed.    Dispense:  60 tablet    Refill:  0     Elberta Leatherwood, MD,MS,  PGY2 05/29/2015 5:48 PM

## 2015-05-29 NOTE — Assessment & Plan Note (Signed)
Patient with long history of orthopaedic issues. Hx of multiple RCRs puts patient at high risk for OA. Pain c/w OA etiology. Antiinflammatory in the form of an NSAID would be ideal but not w/ patient's worsening renal function. - Ultracet prescription written >> pain relief and (hopeful) antiinflammatory effects from acetaminophen, w/ less sedating effects. - Patient was VERY adamant about her desire to take Aleve. I urged against this. HOWEVER, I asked that if she did take naproxen than to take it sparingly and to try her best to only use this once every other week.  Note: Track Renal Function regularly Note: if Ultracet is ineffective >> inquire about steroid injection.

## 2015-06-20 ENCOUNTER — Other Ambulatory Visit: Payer: Self-pay | Admitting: Family Medicine

## 2015-07-01 ENCOUNTER — Encounter: Payer: Self-pay | Admitting: Family Medicine

## 2015-07-01 ENCOUNTER — Ambulatory Visit (INDEPENDENT_AMBULATORY_CARE_PROVIDER_SITE_OTHER): Payer: Medicare Other | Admitting: Family Medicine

## 2015-07-01 VITALS — BP 155/93 | HR 75 | Temp 98.5°F | Wt 179.3 lb

## 2015-07-01 DIAGNOSIS — I10 Essential (primary) hypertension: Secondary | ICD-10-CM

## 2015-07-01 DIAGNOSIS — M25512 Pain in left shoulder: Secondary | ICD-10-CM | POA: Diagnosis not present

## 2015-07-01 DIAGNOSIS — M25551 Pain in right hip: Secondary | ICD-10-CM

## 2015-07-01 DIAGNOSIS — L301 Dyshidrosis [pompholyx]: Secondary | ICD-10-CM

## 2015-07-01 LAB — BASIC METABOLIC PANEL WITH GFR
BUN: 17 mg/dL (ref 7–25)
CALCIUM: 9.6 mg/dL (ref 8.6–10.4)
CHLORIDE: 107 mmol/L (ref 98–110)
CO2: 28 mmol/L (ref 20–31)
CREATININE: 0.67 mg/dL (ref 0.50–0.99)
GFR, Est African American: >89 mL/min (ref >=60)
Glucose, Bld: 86 mg/dL (ref 65–99)
Potassium: 3.8 mmol/L (ref 3.5–5.3)
SODIUM: 143 mmol/L (ref 135–146)

## 2015-07-01 MED ORDER — LISINOPRIL 20 MG PO TABS: 20.0000 mg | ORAL_TABLET | Freq: Every day | ORAL | Status: DC

## 2015-07-01 MED ORDER — HYDROCODONE-ACETAMINOPHEN 5-325 MG PO TABS: 1.0000 | ORAL_TABLET | Freq: Two times a day (BID) | ORAL | Status: DC | PRN

## 2015-07-01 MED ORDER — MELOXICAM 15 MG PO TABS: 15.0000 mg | ORAL_TABLET | Freq: Every day | ORAL | Status: DC

## 2015-07-01 MED ORDER — TRIAMCINOLONE ACETONIDE 0.5 % EX OINT: 1.0000 "application " | TOPICAL_OINTMENT | Freq: Two times a day (BID) | CUTANEOUS | Status: DC

## 2015-07-01 MED ORDER — TERBINAFINE HCL 1 % EX CREA: 1.0000 "application " | TOPICAL_CREAM | Freq: Two times a day (BID) | CUTANEOUS | Status: DC

## 2015-07-01 NOTE — Progress Notes (Signed)
HPI  CC: Rt hip and Lt Shoulder Patient is back for follow-up. At our last visit we had provided a script for Ultacet. Patient reports Ultacet did not help. Aleve continues to work best for her chronic pain. Pain is unchanged from prior visit. No new trauma/falls.   ROS: no fever, chills, n/v/d, hematemesis, melena, hematochezia.   Past medical history and social history reviewed and updated in the EMR as appropriate.  Objective: BP 155/93 mmHg  Pulse 75  Temp(Src) 98.5 F (36.9 C) (Oral)  Wt 179 lb 4.8 oz (81.33 kg) Gen: NAD, alert, cooperative, and pleasant. HEENT: NCAT, EOMI, PERRL CV: RRR, no murmur Resp: CTAB, no wheezes, non-labored Abd: SNTND, BS present, no guarding or organomegaly Ext: No edema, warm; limited ROM bilat UE. Strength 5/5, tenderness diffuse, external rotation severely limited. Yerg/speeds neg. No tenderness in bicepital groove bilat. Hip ROM severely limited. Pain described as deep and near the groin. No TTP of greater troch. Neuro: Alert and oriented, Speech clear, No gross deficits  Assessment and plan:  Left shoulder pain Long history of orthopaedic issues. Hx of multiple RCRs puts patient at high risk for OA. Pain c/w OA and RC tear etiology (primarily supra/infraspinatous). At our last visit I was actively avoiding NSAID use due to "worsening renal function". At this time I do not agree w/ that assessment >> Renal function seems to be relatively stable. Patient's renal function seems to be stable. Patient's age give some concern for NSAID use but I believe pain limits her functionality to much when uncontrolled as to outweigh the risks.  - Mobic prescription >> taken once daily. Monitor functionality and pain. Low threshold to DC this script - Norco >> only use PRN ( patient reports this is most beneficial for hip pain)   Next >> repeat BMP at next visit.   >> reassess willingness to undergo PT (has not been interested)  Dyshidrotic eczema Itching  and discoloration of hands. Likely eczematous cause however discoloration could be indication of tinea infxn.  - Refill Kenalog cream >> asked to hold application of this cream for 1-2 weeks - Lamisil cream >> apply 2x/day to hands for 1-2 weeks  Right hip pain See above A/P    Orders Placed This Encounter  Procedures  . BASIC METABOLIC PANEL WITH GFR    Meds ordered this encounter  Medications  . lisinopril (PRINIVIL,ZESTRIL) 20 MG tablet    Sig: Take 1 tablet (20 mg total) by mouth daily.    Dispense:  30 tablet    Refill:  5  . triamcinolone ointment (KENALOG) 0.5 %    Sig: Apply 1 application topically 2 (two) times daily. To palms    Dispense:  30 g    Refill:  3  . DISCONTD: HYDROcodone-acetaminophen (NORCO) 5-325 MG per tablet    Sig: Take 1 tablet by mouth 2 (two) times daily as needed.    Dispense:  60 tablet    Refill:  0    Do not fill before 05/19/15  . meloxicam (MOBIC) 15 MG tablet    Sig: Take 1 tablet (15 mg total) by mouth daily.    Dispense:  30 tablet    Refill:  0  . terbinafine (LAMISIL) 1 % cream    Sig: Apply 1 application topically 2 (two) times daily.    Dispense:  30 g    Refill:  0  . HYDROcodone-acetaminophen (NORCO) 5-325 MG per tablet    Sig: Take 1 tablet by mouth  2 (two) times daily as needed.    Dispense:  60 tablet    Refill:  0    Do not fill before 05/19/15     Donna Leatherwood, MD,MS,  PGY2 07/04/2015 1:46 PM

## 2015-07-01 NOTE — Patient Instructions (Signed)
It was a pleasure seeing you today in our clinic. Today we discussed your pain and hypertension. Here is the treatment plan we have discussed and agreed upon together:   - I have increased your Lisinopril to 20mg  a day. - Take the new medication "Meloxicam" once a day (1 tablet). DO NOT take any Aleve or Advil with this medication. - I have prescribed a new cream for your hands. Apply this 2 times a day.

## 2015-07-02 ENCOUNTER — Other Ambulatory Visit: Payer: Self-pay | Admitting: Family Medicine

## 2015-07-04 ENCOUNTER — Encounter: Payer: Self-pay | Admitting: Family Medicine

## 2015-07-04 NOTE — Assessment & Plan Note (Signed)
Itching and discoloration of hands. Likely eczematous cause however discoloration could be indication of tinea infxn.  - Refill Kenalog cream >> asked to hold application of this cream for 1-2 weeks - Lamisil cream >> apply 2x/day to hands for 1-2 weeks

## 2015-07-04 NOTE — Assessment & Plan Note (Signed)
See above A/P

## 2015-07-04 NOTE — Assessment & Plan Note (Addendum)
Long history of orthopaedic issues. Hx of multiple RCRs puts patient at high risk for OA. Pain c/w OA and RC tear etiology (primarily supra/infraspinatous). At our last visit I was actively avoiding NSAID use due to "worsening renal function". At this time I do not agree w/ that assessment >> Renal function seems to be relatively stable. Patient's renal function seems to be stable. Patient's age give some concern for NSAID use but I believe pain limits her functionality to much when uncontrolled as to outweigh the risks.  - Mobic prescription >> taken once daily. Monitor functionality and pain. Low threshold to DC this script - Norco >> only use PRN ( patient reports this is most beneficial for hip pain)   Next >> repeat BMP at next visit.   >> reassess willingness to undergo PT (has not been interested)

## 2015-07-28 ENCOUNTER — Other Ambulatory Visit: Payer: Self-pay | Admitting: Family Medicine

## 2015-08-13 ENCOUNTER — Ambulatory Visit: Payer: Medicare Other | Admitting: Family Medicine

## 2015-08-21 ENCOUNTER — Telehealth: Payer: Self-pay | Admitting: Family Medicine

## 2015-08-21 NOTE — Telephone Encounter (Signed)
Pt is calling and needs a refill on her Hydrocodone left up front for pick up. Please call patient when ready to pick up. jw

## 2015-08-22 NOTE — Telephone Encounter (Signed)
Patient needs an appt. For controlled substances

## 2015-08-24 ENCOUNTER — Other Ambulatory Visit: Payer: Self-pay | Admitting: Family Medicine

## 2015-08-25 ENCOUNTER — Ambulatory Visit (INDEPENDENT_AMBULATORY_CARE_PROVIDER_SITE_OTHER): Payer: Medicare Other | Admitting: Family Medicine

## 2015-08-25 ENCOUNTER — Encounter: Payer: Self-pay | Admitting: Family Medicine

## 2015-08-25 VITALS — BP 160/92 | HR 81 | Temp 98.7°F | Wt 181.8 lb

## 2015-08-25 DIAGNOSIS — M25512 Pain in left shoulder: Secondary | ICD-10-CM

## 2015-08-25 DIAGNOSIS — R51 Headache: Secondary | ICD-10-CM

## 2015-08-25 DIAGNOSIS — M542 Cervicalgia: Secondary | ICD-10-CM

## 2015-08-25 DIAGNOSIS — G8929 Other chronic pain: Secondary | ICD-10-CM

## 2015-08-25 DIAGNOSIS — Z7189 Other specified counseling: Secondary | ICD-10-CM | POA: Diagnosis not present

## 2015-08-25 DIAGNOSIS — R519 Headache, unspecified: Secondary | ICD-10-CM

## 2015-08-25 MED ORDER — HYDROCODONE-ACETAMINOPHEN 5-325 MG PO TABS: 1.0000 | ORAL_TABLET | Freq: Two times a day (BID) | ORAL | Status: DC | PRN

## 2015-08-25 NOTE — Progress Notes (Signed)
Subjective: CC: headache, neck pain, chronic pain HPI: Patient is a 69 y.o. female presenting to clinic today for office visit. Concerns today include:  1. Neck pain She notes that headache and neck pain started about 2-3 weeks ago.  She notes that the neck feels sore.  Neck pain is on the right side.  No injury.  Notes numbness and tingling in her right hand.  Present most of the time.  Certain positions tend to relieve the pain/numbness.    2. Headache She notes that the headache is located on the Left side.  She notes that headache is present intermittently.  Pain is described as sharp in nature.  No visual disturbance, dizziness, weakness, numbness/tingling with headache, lightheadedness, nausea, photophobia, phonophobia.  Has tried to take ASA, Tylenol.  Norco does relieve headache.  She notes that this occ relieves headache.  Headache not associated with chewing food.  3. Chronic pain management Patient presenting to clinic for follow up on chronic pain management.  Patient reports that they are doing well on Norco and Mobic.  They take tablets sparingly.  Patient reports that pain in their hip pain is well controlled.  Shoulder pain is worse today.  Pain in her L shoulder is 8/10 today.  Denies somnolence, dizziness, difficulty breathing.      Social History Reviewed. FamHx and MedHx updated.  Please see EMR.  ROS: Per HPI  Objective: Office vital signs reviewed. BP 160/92 mmHg  Pulse 81  Temp(Src) 98.7 F (37.1 C) (Oral)  Wt 181 lb 12.8 oz (82.464 kg)  Physical Examination:  General: Awake, alert, well nourished, NAD HEENT: Normal    Neck: No masses palpated. No LAD    Eyes: PERRLA, EOMI, +arcus senilis     Nose: nasal turbinates moist    Throat: MMM, no erythema Extremities: WWP, No edema, cyanosis or clubbing; +2 radial pulses bilaterally MSK: uses crutches to ambulate, negative Spurling's, 5/5 UE strength, +TTP to R cervical paraspinal muscles, no bony  abnormalities appreciated. Neuro: Strength and sensation grossly intact, follows commands, CN 2-12 grossly in tact.  2008 Cervical MRI reviewed.  Assessment/ Plan: 69 y.o. female with  1. Encounter for chronic pain management. Discussed with patient's PCP. - Refill for Norco provided at today's appt - Patient to follow up with PCP in next 2 weeks for continued managment  2. Left shoulder pain.  Chronic.  Suspect OA vs referred pain from cervicals. - HYDROcodone-acetaminophen (NORCO) 5-325 MG tablet; Take 1 tablet by mouth 2 (two) times daily as needed.  Dispense: 60 tablet; Refill: 0 - Return precautions reviewed - Follow up with PCP as scheduled  3. Neck pain on right side.  2008 imaging reviewed, which revealed degenerative changes.  Pain likely 2/2 to this vs muscle spasm.  Exam with ropey muscle changes in the trapezius and paraspinal musculature.  Patient notes intermittent sensory changes in RUE but sensation in tact on exam. - Could consider additional imaging.  Though patient does not desire surgical intervention - Continue Mobic and Norco PRN pain. - Follow up with PCP  4. Acute nonintractable headache, unspecified headache type.  Could be 2/2 to muscle tension vs degenerative changes in the neck vs HTN.  No red flags on exam. - Pain medication as above, could consider adjusting BP medications, as BP continued to be elevated upon repeat testing - Return precautions reviewed, see AVS - Follow up with PCP in 2 weeks or sooner if worsening.  Janora Norlander, DO PGY-2, [facility]

## 2015-08-25 NOTE — Patient Instructions (Signed)
I recommend that you follow up with Dr Alease Frame in the next couple of weeks to discuss your pain regimen and follow up on headaches.  If you develop weakness, confusion, slurred speech, worsening headache, vision changes, I want you to go immediately to the ED.  Headache and Arthritis If you have arthritis and headaches, it is possible the two problems are related. Some headaches can be caused by arthritis in your neck (cervicogenic headaches).  Pain medicine is another possible link between arthritis and headache. If you take a lot of over-the-counter medicines for arthritis pain, you may develop the type of headache that can happen when you stop taking your over-the-counter pain reliever or lower your dose too quickly (rebound headache).  WHAT TYPES OF ARTHRITIS CAN CAUSE A HEADACHE? There are two types of arthritis, rheumatoid arthritis and osteoarthritis. Both types of arthritis can cause headaches.   Rheumatoid arthritis (RA) is an autoimmune disease that causes inflammation of your joints. When you have RA, your body's defense system (immune system) attacks the joints of your body and causes inflammation. This can lead to deformity over time.  Osteoarthritis (OA) is wear and tear caused by joint use over time. Osteoarthritis is not an inflammatory disease. Both OA and RA can cause neck pain that is felt in the head. When the pain is felt in a different location than it originates, it is called radiating or referred pain. This pain is usually felt in the back of the head.  HOW ARE HEADACHES AND ARTHRITIS RELATED? RA can affect any joint in the body, including the joints between the bones of the neck (cervical vertebrae). The neck joints most commonly affected by RA are the top two joints, between the first and second cervical vertebra. Inflammation in these joints may be felt as neck pain and head pain. OA of the neck may be caused by gradual wear and tear or by a neck injury. Neck vertebrae may  develop calcium deposits in the areas where muscle attach. Wear and tear of the vertebra may cause pressure on the nerves that leave the spinal cord. These changes can cause referred pain that may be felt as a headache. HOW ARE HEADACHES ASSOCIATED WITH ARTHRITIS DIAGNOSED?  Your health care provider may diagnose headache caused by RA if you have inflammation of vertebrae in your neck. You may have:  Blood tests to measure how much inflammation you have.  Imaging studies of your neck (MRI) to check for inflammation of cervical vertebrae.  Your health care provider may diagnose headache caused by OA if an X-ray shows:  Calcium deposits.  Bone spurs.  Narrowing of the space between neck vertebrae.  Your health care provider may diagnose rebound headache if you have a history of using over-the-counter pain relievers frequently. WHEN SHOULD I SEEK CARE FOR MY HEADACHES? Call your health care provider if:  You have more than three headaches per week.  You take an over-the-counter pain reliever almost every day.  Your headaches are getting worse and happening more often.  You have headache with fever.  You have headache with numbness, weakness, or dizziness.  You have headache with nausea or vomiting. WHAT ARE MY TREATMENT OPTIONS?  If you have headache caused by RA, treatment may include:  Over-the-counter or prescription-strength anti-inflammatory medicines.  Disease-modifying antirheumatic drugs (DMARDs). These medicines slow or stop the progression of RA.  If you have headache caused by OA, treatment may include:  Over-the-counter pain medicines.  Heat or massage.  Physical therapy.  If you have rebound headaches:  They will usually go away within several days of stopping the medicine that caused them.  You may be able to gradually reduce the amount of medicines you take to prevent headache.  Ask your health care provider if you can take another type of medicine  instead.   This information is not intended to replace advice given to you by your health care provider. Make sure you discuss any questions you have with your health care provider.   Document Released: 01/07/2004 Document Revised: 11/07/2014 Document Reviewed: 01/20/2014 Elsevier Interactive Patient Education [employer].

## 2015-09-03 NOTE — Telephone Encounter (Signed)
LMOVm for pt to call us back. Read message below. Omega Durante Kennon Holter, CMA

## 2015-09-15 ENCOUNTER — Encounter: Payer: Self-pay | Admitting: Family Medicine

## 2015-09-15 ENCOUNTER — Ambulatory Visit (INDEPENDENT_AMBULATORY_CARE_PROVIDER_SITE_OTHER): Payer: Medicare Other | Admitting: Family Medicine

## 2015-09-15 VITALS — BP 158/84 | HR 66 | Temp 98.3°F | Ht 68.0 in | Wt 178.0 lb

## 2015-09-15 DIAGNOSIS — Z7189 Other specified counseling: Secondary | ICD-10-CM

## 2015-09-15 DIAGNOSIS — G8929 Other chronic pain: Secondary | ICD-10-CM

## 2015-09-15 DIAGNOSIS — Z23 Encounter for immunization: Secondary | ICD-10-CM

## 2015-09-15 DIAGNOSIS — Z79899 Other long term (current) drug therapy: Secondary | ICD-10-CM | POA: Diagnosis not present

## 2015-09-15 DIAGNOSIS — M25512 Pain in left shoulder: Secondary | ICD-10-CM

## 2015-09-15 DIAGNOSIS — I10 Essential (primary) hypertension: Secondary | ICD-10-CM | POA: Diagnosis not present

## 2015-09-15 MED ORDER — LISINOPRIL 20 MG PO TABS: 40.0000 mg | ORAL_TABLET | Freq: Every day | ORAL | Status: DC

## 2015-09-15 MED ORDER — MELOXICAM 15 MG PO TABS: 15.0000 mg | ORAL_TABLET | Freq: Every day | ORAL | Status: DC

## 2015-09-15 MED ORDER — HYDROCODONE-ACETAMINOPHEN 5-325 MG PO TABS: 1.0000 | ORAL_TABLET | Freq: Two times a day (BID) | ORAL | Status: DC | PRN

## 2015-09-15 NOTE — Patient Instructions (Signed)
It was a pleasure seeing you today in our clinic. Today we discussed your pain and high blood pressure. Here is the treatment plan we have discussed and agreed upon together:   - I have increased your Lisinopril to 40mg . This means you now take 2 tablets once a day (instead of 1 tablet). Please call our office if you experience any signs of low blood pressure as discussed in the office. - I have refilled your pain medications. You will not be able to fill this prescription until 09/25/15.

## 2015-09-18 NOTE — Assessment & Plan Note (Addendum)
Patient is currently hypertensive in the office. - I've increased her lisinopril to 40 mg daily. - I have placed a order for a future BMP - I've asked patient to set up an appointment in 2 weeks for a nursing visit (BP check) and blood draw.

## 2015-09-18 NOTE — Assessment & Plan Note (Signed)
Patient continues to have chronic pain involving multiple major joints. Patient takes narcotics and NSAIDs for pain relief. She reports meloxicam as the most beneficial of these. Unfortunately, patient also has systolic congestive heart failure which makes any and all NSAIDs a risky option for this patient. - I refilled patient's Norco - I refilled patient's Mobic >> we discussed discontinuing this in the future.

## 2015-09-18 NOTE — Progress Notes (Signed)
   HPI  CC: Chronic pain. Donna Herrera is here to discuss her chronic pain. Donna Herrera has a long history of degenerative joint disease in virtually all of her major joints. She has had multiple large joint arthroscopies in the past. She continues to take narcotic medications for this pain as well as meloxicam for the inflammation. She states that the meloxicam is the most beneficial for this pain however she continues to have debilitating discomfort with any and all ambulation. Donna Herrera has not ingested in any further surgical intervention.  Of note Donna Herrera's blood pressure is acutely elevated at today's visit. I discussed with her that her pain, mobic, and excessive salt intake could be currently affecting these numbers. Donna Herrera states that she tries to refrain from taking the Mobic on a daily basis and has not taken any today.  ROS: Donna Herrera endorses headache. She denies fever, chills, weight loss, nausea, vomiting, diarrhea, vision changes, dizziness, vertigo, chest pain, shortness of breath, abdominal pain, dysuria.  Past medical history and social history reviewed and updated in the EMR as appropriate.  Objective: BP 158/84 mmHg  Pulse 66  Temp(Src) 98.3 F (36.8 C) (Oral)  Ht 5\' 8"  (1.727 m)  Wt 178 lb (80.74 kg)  BMI 27.07 kg/m2  SpO2 99% Gen: NAD, alert, cooperative HEENT: NCAT, EOMI, PERRL CV: RRR  Resp: CTAB, non-labored Ext: No edema, warm; limited ROM bilat UE. Strength 5/5, tenderness diffuse, external rotation severely limited. Yerg/speeds neg. No tenderness in bicepital groove bilat. Hip ROM severely limited. Pain described as deep and near the groin. No TTP of greater troch. Neuro: Alert and oriented, Speech clear, No gross deficits  Assessment and plan:  Encounter for chronic pain management Donna Herrera continues to have chronic pain involving multiple major joints. Donna Herrera takes narcotics and NSAIDs for pain relief. She reports meloxicam as the most beneficial of these.  Unfortunately, Donna Herrera also has systolic congestive heart failure which makes any and all NSAIDs a risky option for this Donna Herrera. - I refilled Donna Herrera's Norco - I refilled Donna Herrera's Mobic >> we discussed discontinuing this in the future.  Hypertension Donna Herrera is currently hypertensive in the office. - I've increased her lisinopril to 40 mg daily. - I have placed a order for a future BMP - I've asked Donna Herrera to set up an appointment in 2 weeks for a nursing visit (BP check) and blood draw.    Orders Placed This Encounter  Procedures  . Flu Vaccine QUAD 36+ mos IM  . BASIC METABOLIC PANEL WITH GFR    Standing Status: Future     Number of Occurrences:      Standing Expiration Date: 09/14/2016    Meds ordered this encounter  Medications  . lisinopril (PRINIVIL,ZESTRIL) 20 MG tablet    Sig: Take 2 tablets (40 mg total) by mouth daily.    Dispense:  60 tablet    Refill:  5  . meloxicam (MOBIC) 15 MG tablet    Sig: Take 1 tablet (15 mg total) by mouth daily.    Dispense:  30 tablet    Refill:  0  . HYDROcodone-acetaminophen (NORCO) 5-325 MG tablet    Sig: Take 1 tablet by mouth 2 (two) times daily as needed. Do not fill until 09/25/15    Dispense:  60 tablet    Refill:  0     Elberta Leatherwood, MD,MS,  PGY2 09/18/2015 6:44 PM

## 2015-09-20 ENCOUNTER — Emergency Department (INDEPENDENT_AMBULATORY_CARE_PROVIDER_SITE_OTHER): Payer: Medicare Other

## 2015-09-20 ENCOUNTER — Encounter (HOSPITAL_COMMUNITY): Payer: Self-pay | Admitting: Emergency Medicine

## 2015-09-20 ENCOUNTER — Other Ambulatory Visit (HOSPITAL_COMMUNITY): Admission: AD | Admit: 2015-09-20 | Payer: Self-pay | Source: Ambulatory Visit | Admitting: Emergency Medicine

## 2015-09-20 ENCOUNTER — Other Ambulatory Visit (HOSPITAL_COMMUNITY)
Admission: RE | Admit: 2015-09-20 | Discharge: 2015-09-20 | Disposition: A | Discharge status: Home / Self Care | Payer: Medicare Other | Source: Ambulatory Visit | Attending: Emergency Medicine | Admitting: Emergency Medicine

## 2015-09-20 ENCOUNTER — Emergency Department (INDEPENDENT_AMBULATORY_CARE_PROVIDER_SITE_OTHER)
Admission: EM | Admit: 2015-09-20 | Discharge: 2015-09-20 | Disposition: A | Discharge status: Home / Self Care | Payer: Medicare Other | Source: Home / Self Care | Attending: Emergency Medicine | Admitting: Emergency Medicine

## 2015-09-20 DIAGNOSIS — R062 Wheezing: Secondary | ICD-10-CM

## 2015-09-20 DIAGNOSIS — N390 Urinary tract infection, site not specified: Secondary | ICD-10-CM | POA: Diagnosis not present

## 2015-09-20 DIAGNOSIS — R1013 Epigastric pain: Secondary | ICD-10-CM | POA: Diagnosis not present

## 2015-09-20 LAB — POCT URINALYSIS DIP (DEVICE)
BILIRUBIN URINE: NEGATIVE
GLUCOSE, UA: NEGATIVE mg/dL
KETONES UR: NEGATIVE mg/dL
Nitrite: NEGATIVE
PROTEIN: NEGATIVE mg/dL
Specific Gravity, Urine: 1.015 (ref 1.005–1.030)
Urobilinogen, UA: 0.2 mg/dL (ref 0.0–1.0)
pH: 6 (ref 5.0–8.0)

## 2015-09-20 LAB — POCT I-STAT, CHEM 8
BUN: 13 mg/dL (ref 6–20)
Calcium, Ion: 1.09 mmol/L — ABNORMAL LOW (ref 1.13–1.30)
Chloride: 103 mmol/L (ref 101–111)
Creatinine, Ser: 0.7 mg/dL (ref 0.44–1.00)
Glucose, Bld: 83 mg/dL (ref 65–99)
HEMATOCRIT: 45 % (ref 36.0–46.0)
HEMOGLOBIN: 15.3 g/dL — AB (ref 12.0–15.0)
POTASSIUM: 3.8 mmol/L (ref 3.5–5.1)
SODIUM: 141 mmol/L (ref 135–145)
TCO2: 24 mmol/L (ref 0–100)

## 2015-09-20 MED ORDER — CEPHALEXIN 500 MG PO CAPS: 500.0000 mg | ORAL_CAPSULE | Freq: Four times a day (QID) | ORAL | Status: DC

## 2015-09-20 MED ORDER — ALBUTEROL SULFATE HFA 108 (90 BASE) MCG/ACT IN AERS: 2.0000 | INHALATION_SPRAY | RESPIRATORY_TRACT | Status: DC | PRN

## 2015-09-20 MED ORDER — PREDNISONE 50 MG PO TABS: ORAL_TABLET | ORAL | Status: DC

## 2015-09-20 NOTE — Discharge Instructions (Signed)
Take full course of antibiotics for urinary tract infection. Take Prednisone for inflammation. Use Albuterol inhaler every 4 hours as needed for shortness of breath and wheezing. Call primary care provider tomorrow for appointment in 1-2 days. If symptoms worsen, including increased fluid, pain, SOB, fever go the Emergency Department for evaluation.   Bronchospasm, Adult A bronchospasm is when the tubes that carry air in and out of your lungs (airways) spasm or tighten. During a bronchospasm it is hard to breathe. This is because the airways get smaller. A bronchospasm can be triggered by:  Allergies. These may be to animals, pollen, food, or mold.  Infection. This is a common cause of bronchospasm.  Exercise.  Irritants. These include pollution, cigarette smoke, strong odors, aerosol sprays, and paint fumes.  Weather changes.  Stress.  Being emotional. HOME CARE   Always have a plan for getting help. Know when to call your doctor and local emergency services (911 in the [place] Know where you can get emergency care.  Only take medicines as told by your doctor.  If you were prescribed an inhaler or nebulizer machine, ask your doctor how to use it correctly. Always use a spacer with your inhaler if you were given one.  Stay calm during an attack. Try to relax and breathe more slowly.  Control your home environment:  Change your heating and air conditioning filter at least once a month.  Limit your use of fireplaces and wood stoves.  Do not  smoke. Do not  allow smoking in your home.  Avoid perfumes and fragrances.  Get rid of pests (such as roaches and mice) and their droppings.  Throw away plants if you see mold on them.  Keep your house clean and dust free.  Replace carpet with wood, tile, or vinyl flooring. Carpet can trap dander and dust.  Use allergy-proof pillows, mattress covers, and box spring covers.  Wash bed sheets and blankets every week in hot water. Dry  them in a dryer.  Use blankets that are made of polyester or cotton.  Wash hands frequently. GET HELP IF:  You have muscle aches.  You have chest pain.  The thick spit you spit or cough up (sputum) changes from clear or white to yellow, green, gray, or bloody.  The thick spit you spit or cough up gets thicker.  There are problems that may be related to the medicine you are given such as:  A rash.  Itching.  Swelling.  Trouble breathing. GET HELP RIGHT AWAY IF:  You feel you cannot breathe or catch your breath.  You cannot stop coughing.  Your treatment is not helping you breathe better.  You have very bad chest pain. MAKE SURE YOU:   Understand these instructions.  Will watch your condition.  Will get help right away if you are not doing well or get worse.   This information is not intended to replace advice given to you by your health care provider. Make sure you discuss any questions you have with your health care provider.   Document Released: 08/14/2009 Document Revised: 11/07/2014 Document Reviewed: 04/09/2013 Elsevier Interactive Patient Education 2016 Else[employer] Urinary Tract Infection Urinary tract infections (UTIs) can develop anywhere along your urinary tract. Your urinary tract is your body's drainage system for removing wastes and extra water. Your urinary tract includes two kidneys, two ureters, a bladder, and a urethra. Your kidneys are a pair of bean-shaped organs. Each kidney is about the size of your fist. They are  located below your ribs, one on each side of your spine. CAUSES Infections are caused by microbes, which are microscopic organisms, including fungi, viruses, and bacteria. These organisms are so small that they can only be seen through a microscope. Bacteria are the microbes that most commonly cause UTIs. SYMPTOMS  Symptoms of UTIs may vary by age and gender of the patient and by the location of the infection. Symptoms in young women  typically include a frequent and intense urge to urinate and a painful, burning feeling in the bladder or urethra during urination. Older women and men are more likely to be tired, shaky, and weak and have muscle aches and abdominal pain. A fever may mean the infection is in your kidneys. Other symptoms of a kidney infection include pain in your back or sides below the ribs, nausea, and vomiting. DIAGNOSIS To diagnose a UTI, your caregiver will ask you about your symptoms. Your caregiver will also ask you to provide a urine sample. The urine sample will be tested for bacteria and white blood cells. White blood cells are made by your body to help fight infection. TREATMENT  Typically, UTIs can be treated with medication. Because most UTIs are caused by a bacterial infection, they usually can be treated with the use of antibiotics. The choice of antibiotic and length of treatment depend on your symptoms and the type of bacteria causing your infection. HOME CARE INSTRUCTIONS  If you were prescribed antibiotics, take them exactly as your caregiver instructs you. Finish the medication even if you feel better after you have only taken some of the medication.  Drink enough water and fluids to keep your urine clear or pale yellow.  Avoid caffeine, tea, and carbonated beverages. They tend to irritate your bladder.  Empty your bladder often. Avoid holding urine for long periods of time.  Empty your bladder before and after sexual intercourse.  After a bowel movement, women should cleanse from front to back. Use each tissue only once. SEEK MEDICAL CARE IF:   You have back pain.  You develop a fever.  Your symptoms do not begin to resolve within 3 days. SEEK IMMEDIATE MEDICAL CARE IF:   You have severe back pain or lower abdominal pain.  You develop chills.  You have nausea or vomiting.  You have continued burning or discomfort with urination. MAKE SURE YOU:   Understand these  instructions.  Will watch your condition.  Will get help right away if you are not doing well or get worse.   This information is not intended to replace advice given to you by your health care provider. Make sure you discuss any questions you have with your health care provider.   Document Released: 07/27/2005 Document Revised: 07/08/2015 Document Reviewed: 11/25/2011 Elsevier Interactive Patient Education [employer].

## 2015-09-20 NOTE — ED Notes (Signed)
Patient reports a 2 day history of chest pain, epigastric pain and right flank pain.  Reports having chest pain earlier today, none now.  Notes increase sob with lying down or movement, bilateral ankle edema, increased lethargy, and white phlegm.

## 2015-09-20 NOTE — ED Provider Notes (Signed)
CSN: [id number]     Arrival date & time 09/20/15  1524 History   First MD Initiated Contact with Patient 09/20/15 1546     Chief Complaint  Patient presents with  . Flank Pain   (Consider location/radiation/quality/duration/timing/severity/associated sxs/prior Treatment) HPI  Donna Herrera is a 69 y.o. female presenting to the urgent care with 2-3 days of chest pain, SOB, and right flank pain. Patient states that "it feels like something is stuck in my chest." Chest pain is located mid-sternum and epigastric. Pain is moderate and non-radiating. Pain is intermittent, lasting for a few minutes at a time. Not currently having chest pain while in urgent care. Associated with SOB (worsened with lying down and mildly with walking/talking), wheezing, lower leg edema, and cough productive of a thick white sputum. There were "streaks of dark" in the sputum today, but no blood. Has nausea when she coughs.   Patient also with severe right flank pain for 2-3 days that is not worsened with deep breaths. Pain unrelieved with Norco. Having a "weird feeling" when urinating, but not necessarily pain/itching. No hematuria. Denies history of kidney stones, asthma, COPD. States has "a heart problem" and has history of "kidney infection." Patient smokes 1 ppd.   Past Medical History  Diagnosis Date  . Allergy   . Arthritis   . Hypertension   . GERD (gastroesophageal reflux disease)   . Chronic pain   . Hyperlipidemia   . Gout   . H/O slipped capital femoral epiphysis (SCFE)    Past Surgical History  Procedure Laterality Date  . Cesarean section    . Tubal ligation    . Total hip arthroplasty  1987    right. Dr. Collier [place]  . Joint replacement  [place]  . Hip fusion      left   . Rotator cuff repair      bilateral  . Cardiac catheterization  2005    normal-Hochrein   Family History  Problem Relation Age of Onset  . Depression Mother   . Diabetes Mother   . Hypertension  Mother   . Stroke Mother   . Heart disease Mother   . Cancer Father     unknown primary  . Diabetes Sister    Social History  Substance Use Topics  . Smoking status: Current Every Day Smoker -- 0.50 packs/day  . Smokeless tobacco: None     Comment: decreased smoking/1 pack every 3 days  . Alcohol Use: Yes     Comment: on holidays   OB History    No data available     Review of Systems  Constitutional: Negative for fever, diaphoresis and appetite change.  HENT: Positive for congestion and rhinorrhea. Negative for ear pain, sore throat and trouble swallowing.   Eyes: Negative for discharge and visual disturbance.  Respiratory: Positive for cough, chest tightness, shortness of breath and wheezing.   Cardiovascular: Positive for chest pain and leg swelling. Negative for palpitations.  Gastrointestinal: Positive for nausea (with cough). Negative for vomiting, abdominal pain, diarrhea, constipation and blood in stool.  Genitourinary: Positive for flank pain.  Musculoskeletal:       Chronic pain  Skin: Negative for rash.  Neurological: Positive for headaches. Negative for dizziness, syncope, light-headedness and numbness.    Allergies  Morphine and related  Home Medications   Prior to Admission medications   Medication Sig Start Date End Date Taking? Authorizing Provider  aspirin 81 MG tablet Take 1 tablet (81  mg total) by mouth daily. 04/16/15   Leeanne Rio, MD  cetirizine (ZYRTEC) 10 MG tablet Take 1 tablet (10 mg total) by mouth daily. Patient not taking: Reported on 02/05/2015 05/22/14   Leeanne Rio, MD  Cholecalciferol 50000 UNITS TABS Take 1 tablet by mouth once a week. For 8 weeks 04/03/15   Leeanne Rio, MD  Colchicine 0.6 MG CAPS 1.2 mg PO x1, followed in 1 hour with 0.6mg  PO x1. Then wait 12 hrs and resume 0.6 BID. Patient not taking: Reported on 12/31/2014 01/09/14   Leeanne Rio, MD  D3-50 50000 UNITS capsule TAKE 1 TABLET BY MOUTH ONCE A WEEK.  FOR 8 WEEKS 05/18/15   Elberta Leatherwood, MD  furosemide (LASIX) 20 MG tablet TAKE 1 TABLET (20 MG TOTAL) BY MOUTH DAILY. 08/24/15   Elberta Leatherwood, MD  HYDROcodone-acetaminophen (NORCO) 5-325 MG tablet Take 1 tablet by mouth 2 (two) times daily as needed. Do not fill until 09/25/15 09/15/15   Elberta Leatherwood, MD  lisinopril (PRINIVIL,ZESTRIL) 20 MG tablet Take 2 tablets (40 mg total) by mouth daily. 09/15/15   Elberta Leatherwood, MD  meloxicam (MOBIC) 15 MG tablet Take 1 tablet (15 mg total) by mouth daily. 09/15/15   Elberta Leatherwood, MD  metoprolol succinate (TOPROL-XL) 50 MG 24 hr tablet TAKE 1 TABLET BY MOUTH DAILY WITH OR JUST AFTER MEAL. 07/28/15   Elberta Leatherwood, MD  pantoprazole (PROTONIX) 20 MG tablet TAKE 2 TABLETS (40 MG TOTAL) BY MOUTH DAILY. 05/18/15   Elberta Leatherwood, MD  sucralfate (CARAFATE) 1 G tablet TAKE 1 TABLET 4 TIMES DAILY ON AN EMPTY STOMACH 1 HOUR BEFORE MEALS AND AT BEDTIME 09/04/14   Leeanne Rio, MD  terbinafine (LAMISIL) 1 % cream Apply 1 application topically 2 (two) times daily. 07/01/15   Elberta Leatherwood, MD  triamcinolone ointment (KENALOG) 0.5 % Apply 1 application topically 2 (two) times daily. To palms 07/01/15   Elberta Leatherwood, MD   Meds Ordered and Administered this Visit  Medications - No data to display  BP 163/82 mmHg  Pulse 69  Temp(Src) 98.8 F (37.1 C) (Oral)  Resp 22  SpO2 100% No data found.   Physical Exam  Constitutional: She is oriented to person, place, and time. She appears well-developed and well-nourished. No distress.  HENT:  Head: Normocephalic and atraumatic.  Right Ear: Tympanic membrane normal.  Left Ear: Tympanic membrane normal.  Nose: Rhinorrhea present.  Mouth/Throat: Uvula is midline, oropharynx is clear and moist and mucous membranes are normal. Abnormal dentition. Dental caries present. No oropharyngeal exudate.  Eyes: Conjunctivae are normal. Pupils are equal, round, and reactive to light.  Neck: Neck supple.  Cardiovascular: Normal rate, regular  rhythm, normal heart sounds and intact distal pulses.   Pulmonary/Chest: No accessory muscle usage. No respiratory distress. She has wheezes.  Abdominal: Soft.  Musculoskeletal: She exhibits edema (bilateral left>right).  CVA tenderness on the right side. Right flank tender to touch/palpation. Pain not worsened with deep breaths. No rash noted to flank.   Lymphadenopathy:    She has no cervical adenopathy.  Neurological: She is alert and oriented to person, place, and time.  Skin: Skin is warm and dry. No rash noted. She is not diaphoretic.  Psychiatric: She has a normal mood and affect. Her behavior is normal. Judgment and thought content normal.    ED Course  Procedures (including critical care time) ED ECG REPORT   Date: 09/20/2015  Rate:  68   Rhythm: normal sinus rhythm  QRS Axis: normal  Intervals: QT prolonged QT 472/ QTc 501 ms   ST/T Wave abnormalities: normal  Conduction Disutrbances:none  Narrative Interpretation: unchanged from prior EKG 12/2014 besides prolonged QT. No concerning meds for QT prolongation on medication list.   Old EKG Reviewed: changes noted  I have personally reviewed the EKG tracing and agree with the computerized printout as noted.  Labs Review Labs Reviewed  POCT URINALYSIS DIP (DEVICE) - Abnormal; Notable for the following:    Hgb urine dipstick TRACE (*)    Leukocytes, UA MODERATE (*)    All other components within normal limits  POCT I-STAT, CHEM 8 - Abnormal; Notable for the following:    Calcium, Ion 1.09 (*)    Hemoglobin 15.3 (*)    All other components within normal limits  URINE CULTURE    Imaging Review Dg Chest 2 View  09/20/2015  CLINICAL DATA:  Epigastric pain for 2 days.  Initial encounter. EXAM: CHEST  2 VIEW COMPARISON:  11/06/2013 radiographs. FINDINGS: The heart size and mediastinal contours are stable. There is chronic elevation of the right hemidiaphragm and chronic interstitial prominence. No edema, confluent airspace  opacity or significant pleural effusion identified. There are advanced glenohumeral degenerative changes bilaterally with obliteration of the subacromial space of both shoulders consistent with chronic rotator cuff tears. IMPRESSION: Stable chest.  No acute cardiopulmonary process. Electronically Signed   By: Richardean Sale M.D.   On: 09/20/2015 17:00      MDM   1. Wheezing   2. UTI (lower urinary tract infection)     69 yo female with 2-3 days chest pain, SOB, leg swelling, and right flank pain.  EKG unchanged from 12/2014 besides QT prolongation QT 472 ms/ QTc 501 ms.  UA shows trace Hgb and moderate leukocytes. I-Stat normal. CXR shows: Stable chest. No acute cardiopulmonary process.   Afebrile while in urgent care and without current chest pain.  UA consistent with UTI. Possible source of right flank pain. Other source could be from lungs. No shingles-like rash noted on exam. CXR stable with 100% O2 sat. Wheezes noted on exam. After discussing treatment options, patient declined transfer to Emergency Department. Differential includes bronchitis, CHF, and PE (although this is felt to be unlikely). Verbalizes understanding to call PCP tomorrow for appointment in no more than 2 days. Patient to go to ED if symptoms worsen including fever, increased SOB/fluid/pain. Rx Prednisone 50 mg x 5 days for inflammation. Rx Albuterol inhaler for SOB/wheezing. Rx Keflex 500 mg four times daily x 5 days for UTI. Concern for development of pyelonephritis. Again, emphasized need for close follow up with PCP in 1-2 days and patient verbalized understanding.   Patient voices no other complaints at this time.   I have seen and examined this patient with a student.  I have reviewed and edited the note as necessary.    Melony Overly, MD 09/20/15 [phone]

## 2015-09-22 LAB — URINE CULTURE

## 2015-09-25 NOTE — ED Notes (Addendum)
Final report of urine C&S shows B strep alg, sensitive to Rx provided day of [facility] visit

## 2015-09-29 ENCOUNTER — Ambulatory Visit (INDEPENDENT_AMBULATORY_CARE_PROVIDER_SITE_OTHER): Payer: Medicare Other | Admitting: *Deleted

## 2015-09-29 ENCOUNTER — Other Ambulatory Visit: Payer: Medicare Other

## 2015-09-29 VITALS — BP 152/84

## 2015-09-29 DIAGNOSIS — Z136 Encounter for screening for cardiovascular disorders: Secondary | ICD-10-CM

## 2015-09-29 DIAGNOSIS — I10 Essential (primary) hypertension: Secondary | ICD-10-CM

## 2015-09-29 DIAGNOSIS — Z79899 Other long term (current) drug therapy: Secondary | ICD-10-CM | POA: Diagnosis not present

## 2015-09-29 DIAGNOSIS — Z0130 Encounter for examination of blood pressure without abnormal findings: Secondary | ICD-10-CM

## 2015-09-29 NOTE — Progress Notes (Signed)
  Patient in nurse clinic today for blood pressure check.  Patient had complaints of feet swelling and dizziness.  Patient had 2-3+ pitting edema in both feet.  Patient complained of dizziness from time to time.  Patient denied SOB, chest pain, numbness/tingling of extremities, n/v.  Patient was seen at the urgent care a few days ago for UTI and wheezing.  No problems wheezing today; only used inhaler one time.  Patient requested to have urine sample repeated to make sure kidney infection was gone.  Patient took all antibiotics as prescribed.  Advised patient to elevate feet while sitting, get up slowly and to call clinic or 911 if she develops any of the symptoms previously listed.  Appointment rescheduled with Dr. Ardelia Mems on 10/01/15 at 11 AM.  PCP is aware.  Derl Barrow, RN

## 2015-09-29 NOTE — Progress Notes (Unsigned)
Bmp done today [facility] 

## 2015-09-30 LAB — BASIC METABOLIC PANEL WITH GFR
BUN: 10 mg/dL (ref 7–25)
CALCIUM: 8.8 mg/dL (ref 8.6–10.4)
CO2: 30 mmol/L (ref 20–31)
CREATININE: 0.68 mg/dL (ref 0.50–0.99)
Chloride: 103 mmol/L (ref 98–110)
GFR, Est Non African American: >89 mL/min (ref >=60)
GLUCOSE: 102 mg/dL — AB (ref 65–99)
Potassium: 3.5 mmol/L (ref 3.5–5.3)
Sodium: 140 mmol/L (ref 135–146)

## 2015-10-01 ENCOUNTER — Ambulatory Visit (INDEPENDENT_AMBULATORY_CARE_PROVIDER_SITE_OTHER): Payer: Medicare Other | Admitting: Family Medicine

## 2015-10-01 ENCOUNTER — Encounter: Payer: Self-pay | Admitting: Family Medicine

## 2015-10-01 DIAGNOSIS — R1011 Right upper quadrant pain: Secondary | ICD-10-CM

## 2015-10-01 DIAGNOSIS — R059 Cough, unspecified: Secondary | ICD-10-CM

## 2015-10-01 DIAGNOSIS — R600 Localized edema: Secondary | ICD-10-CM

## 2015-10-01 DIAGNOSIS — R05 Cough: Secondary | ICD-10-CM | POA: Diagnosis not present

## 2015-10-01 DIAGNOSIS — R109 Unspecified abdominal pain: Secondary | ICD-10-CM

## 2015-10-01 NOTE — Progress Notes (Signed)
Date of Visit: 10/01/2015   HPI:  Patient presents today to discuss swelling, cough, and general malaise.  Was seen in urgent care on 11/20 for R flank pain and diagnosed with UTI and wheezing. Treated with prednisone for 5 days and keflex. Urine culture grew GBS. Followed up here at [facility] two days ago for routine BP check and labwork, and complained of dizziness and feet swelling so appointment was scheduled today with me for evaluation.  She reports two days ago began having cough and sore throat with congestion. Coughing up brown phlegm. Feet have been swollen for a few weeks, much moreso than normal. She takes lasix daily.  Still having R flank pain, although reports it's mildly improved. No fevers. No further dysuria. Just generally does not feel well.   ROS: See HPI.  [place]: history of DJD, chronic pain with chronic opioid use, GERD, hyperlipidemia, gout, hypertension, systolic CHF  PHYSICAL EXAM: BP 138/57 mmHg  Pulse 80  Temp(Src) 98.5 F (36.9 C) (Oral)  Wt 187 lb (84.823 kg)  SpO2 97% Gen: NAD. Appears as though does not feel well HEENT: normocephalic, atraumatic.  Lungs: coarse BS throughout but no focal crackles. Mildly increased WOB. Speaks in full sentences without distress Neuro: alert, grossly nonfocal Ext: 3+ pitting edema bilateral ankles Back: + significant R CVA tenderness Abdomen: lower abdomen soft and nontender without masses. LUQ nontender. RUQ markedly TTP with patient jumping up when that area is palpated. No masses palpated.  ASSESSMENT/PLAN:  69 yo F with history of systolic CHF presenting with constellation of symptoms, including continued R flank pain, RUQ tenderness, swelling, cough, and congestion.  I am concerned that she may have ongoing unresolved pyelonephritis given her degree of CVA tenderness and RUQ tenderness on exam today. May have other pathology including biliary disease.  With her cough and lower extremity edema, concern for possible CHF  exacerbation. Differential diagnosis includes viral URI, but her weight is also up.  With all this in mind, I recommended she go to the ED to be seen today for labs and imaging. Suspect she will need CT of abdomen to rule out acute intraabdominal pathology. Will also allow for evaluation of pyelonephritis and eval for CHF exacerbation or pneumonia. Patient willing to go directly to ED for evaluation.  FOLLOW UP: Going directly to ED for evaluation.  [place]. Ardelia Mems, [place]

## 2015-10-01 NOTE — Patient Instructions (Signed)
[name], Please go to the Emergency Room to be evaluated. I'm concerned about the pain you have in your stomach and back, as well as the swelling and coughing you're having. They will be able to do labwork and probably a CT scan to check out what's going on.  Be well, Dr. Ardelia Mems

## 2015-10-03 ENCOUNTER — Other Ambulatory Visit: Payer: Self-pay

## 2015-10-03 ENCOUNTER — Encounter (HOSPITAL_COMMUNITY): Payer: Self-pay | Admitting: Emergency Medicine

## 2015-10-03 ENCOUNTER — Emergency Department (HOSPITAL_COMMUNITY): Payer: Medicare Other

## 2015-10-03 ENCOUNTER — Inpatient Hospital Stay (HOSPITAL_COMMUNITY)
Admission: EM | Admit: 2015-10-03 | Discharge: 2015-10-06 | DRG: 291 | Disposition: A | Discharge status: Home / Self Care | Payer: Medicare Other | Attending: Internal Medicine | Admitting: Internal Medicine

## 2015-10-03 DIAGNOSIS — I5023 Acute on chronic systolic (congestive) heart failure: Secondary | ICD-10-CM | POA: Diagnosis not present

## 2015-10-03 DIAGNOSIS — R109 Unspecified abdominal pain: Secondary | ICD-10-CM | POA: Diagnosis present

## 2015-10-03 DIAGNOSIS — J189 Pneumonia, unspecified organism: Secondary | ICD-10-CM | POA: Diagnosis present

## 2015-10-03 DIAGNOSIS — E785 Hyperlipidemia, unspecified: Secondary | ICD-10-CM | POA: Diagnosis not present

## 2015-10-03 DIAGNOSIS — R10A0 Flank pain, unspecified side: Secondary | ICD-10-CM | POA: Diagnosis present

## 2015-10-03 DIAGNOSIS — Z8249 Family history of ischemic heart disease and other diseases of the circulatory system: Secondary | ICD-10-CM | POA: Diagnosis not present

## 2015-10-03 DIAGNOSIS — J9600 Acute respiratory failure, unspecified whether with hypoxia or hypercapnia: Secondary | ICD-10-CM | POA: Diagnosis not present

## 2015-10-03 DIAGNOSIS — J441 Chronic obstructive pulmonary disease with (acute) exacerbation: Secondary | ICD-10-CM | POA: Diagnosis not present

## 2015-10-03 DIAGNOSIS — Z823 Family history of stroke: Secondary | ICD-10-CM | POA: Diagnosis not present

## 2015-10-03 DIAGNOSIS — F17200 Nicotine dependence, unspecified, uncomplicated: Secondary | ICD-10-CM | POA: Diagnosis present

## 2015-10-03 DIAGNOSIS — Z7982 Long term (current) use of aspirin: Secondary | ICD-10-CM

## 2015-10-03 DIAGNOSIS — R05 Cough: Secondary | ICD-10-CM

## 2015-10-03 DIAGNOSIS — J984 Other disorders of lung: Secondary | ICD-10-CM | POA: Insufficient documentation

## 2015-10-03 DIAGNOSIS — Z79899 Other long term (current) drug therapy: Secondary | ICD-10-CM

## 2015-10-03 DIAGNOSIS — K219 Gastro-esophageal reflux disease without esophagitis: Secondary | ICD-10-CM | POA: Diagnosis not present

## 2015-10-03 DIAGNOSIS — M1990 Unspecified osteoarthritis, unspecified site: Secondary | ICD-10-CM | POA: Diagnosis present

## 2015-10-03 DIAGNOSIS — I110 Hypertensive heart disease with heart failure: Secondary | ICD-10-CM | POA: Diagnosis present

## 2015-10-03 DIAGNOSIS — I509 Heart failure, unspecified: Secondary | ICD-10-CM | POA: Insufficient documentation

## 2015-10-03 DIAGNOSIS — J440 Chronic obstructive pulmonary disease with acute lower respiratory infection: Secondary | ICD-10-CM | POA: Diagnosis present

## 2015-10-03 DIAGNOSIS — I10 Essential (primary) hypertension: Secondary | ICD-10-CM | POA: Diagnosis present

## 2015-10-03 DIAGNOSIS — Z833 Family history of diabetes mellitus: Secondary | ICD-10-CM

## 2015-10-03 DIAGNOSIS — R7989 Other specified abnormal findings of blood chemistry: Secondary | ICD-10-CM | POA: Diagnosis not present

## 2015-10-03 DIAGNOSIS — R252 Cramp and spasm: Secondary | ICD-10-CM | POA: Diagnosis not present

## 2015-10-03 DIAGNOSIS — R0602 Shortness of breath: Secondary | ICD-10-CM | POA: Diagnosis not present

## 2015-10-03 DIAGNOSIS — Z96641 Presence of right artificial hip joint: Secondary | ICD-10-CM | POA: Diagnosis present

## 2015-10-03 DIAGNOSIS — R059 Cough, unspecified: Secondary | ICD-10-CM | POA: Diagnosis present

## 2015-10-03 DIAGNOSIS — R1030 Lower abdominal pain, unspecified: Secondary | ICD-10-CM | POA: Diagnosis not present

## 2015-10-03 DIAGNOSIS — M109 Gout, unspecified: Secondary | ICD-10-CM | POA: Diagnosis present

## 2015-10-03 DIAGNOSIS — I5043 Acute on chronic combined systolic (congestive) and diastolic (congestive) heart failure: Principal | ICD-10-CM | POA: Diagnosis present

## 2015-10-03 DIAGNOSIS — R0600 Dyspnea, unspecified: Secondary | ICD-10-CM

## 2015-10-03 DIAGNOSIS — Z720 Tobacco use: Secondary | ICD-10-CM | POA: Diagnosis present

## 2015-10-03 LAB — EXPECTORATED SPUTUM ASSESSMENT W GRAM STAIN, RFLX TO RESP C: Special Requests: NORMAL

## 2015-10-03 LAB — BRAIN NATRIURETIC PEPTIDE: B NATRIURETIC PEPTIDE 5: 916.9 pg/mL — AB (ref 0.0–100.0)

## 2015-10-03 LAB — URINALYSIS, ROUTINE W REFLEX MICROSCOPIC
Bilirubin Urine: NEGATIVE
Glucose, UA: NEGATIVE mg/dL
HGB URINE DIPSTICK: NEGATIVE
Ketones, ur: NEGATIVE mg/dL
Leukocytes, UA: NEGATIVE
Nitrite: NEGATIVE
PH: 6.5 (ref 5.0–8.0)
Protein, ur: NEGATIVE mg/dL
SPECIFIC GRAVITY, URINE: 1.015 (ref 1.005–1.030)

## 2015-10-03 LAB — COMPREHENSIVE METABOLIC PANEL
ALK PHOS: 102 U/L (ref 38–126)
ALT: 26 U/L (ref 14–54)
AST: 23 U/L (ref 15–41)
Albumin: 3.9 g/dL (ref 3.5–5.0)
Anion gap: 10 (ref 5–15)
BILIRUBIN TOTAL: 1 mg/dL (ref 0.3–1.2)
BUN: 14 mg/dL (ref 6–20)
CALCIUM: 9 mg/dL (ref 8.9–10.3)
CHLORIDE: 104 mmol/L (ref 101–111)
CO2: 25 mmol/L (ref 22–32)
CREATININE: 0.68 mg/dL (ref 0.44–1.00)
Glucose, Bld: 111 mg/dL — ABNORMAL HIGH (ref 65–99)
Potassium: 3.7 mmol/L (ref 3.5–5.1)
Sodium: 139 mmol/L (ref 135–145)
Total Protein: 7.8 g/dL (ref 6.5–8.1)

## 2015-10-03 LAB — CBC
HEMATOCRIT: 38.4 % (ref 36.0–46.0)
HEMOGLOBIN: 12.1 g/dL (ref 12.0–15.0)
MCH: 28.7 pg (ref 26.0–34.0)
MCHC: 31.5 g/dL (ref 30.0–36.0)
MCV: 91.2 fL (ref 78.0–100.0)
Platelets: 258 10*3/uL (ref 150–400)
RBC: 4.21 MIL/uL (ref 3.87–5.11)
RDW: 14.5 % (ref 11.5–15.5)
WBC: 6.9 10*3/uL (ref 4.0–10.5)

## 2015-10-03 LAB — EXPECTORATED SPUTUM ASSESSMENT W REFEX TO RESP CULTURE

## 2015-10-03 LAB — I-STAT TROPONIN, ED: Troponin i, poc: 0.01 ng/mL (ref 0.00–0.08)

## 2015-10-03 MED ORDER — ALBUTEROL SULFATE (2.5 MG/3ML) 0.083% IN NEBU
5.0000 mg | INHALATION_SOLUTION | Freq: Once | RESPIRATORY_TRACT | Status: AC
  Administered 2015-10-03: 5 mg via RESPIRATORY_TRACT
  Filled 2015-10-03: qty 6

## 2015-10-03 NOTE — ED Notes (Signed)
Bed: WA09 Expected date: 10/03/15 Expected time: 9:49 PM Means of arrival: Ambulance Comments: 69 yo M  Hand injury

## 2015-10-03 NOTE — ED Provider Notes (Signed)
CSN: [id number]     Arrival date & time 10/03/15  1930 History   First MD Initiated Contact with Patient 10/03/15 1950     Chief Complaint  Patient presents with  . Cough  . Flank Pain   HPI  Donna Herrera is a 69 year old female with past medical history of CHF, hypertension, GERD and chronic pain presenting with cough and right flank pain. She states that her cough has been present for the past 2 days and is productive of yellow-brown phlegm. She also endorses increased shortness of breath with her cough. She states that these symptoms have gotten progressively worse since they started. She states that it is difficult to lie flat on her back and she has significant increased dyspnea on exertion. She also reports chest pain with deep inspiration and cough. The pain is midsternal and is not exertional. The chest pain does not radiate. She also notes that she had right flank pain that radiates to her right upper quadrant. She states that she was diagnosed with pyelonephritis on 11/20 and has had right flank pain ever since. She states she completed her antibiotic course of Keflex for the pyelonephritis. Denies fevers, chills, headaches, dizziness, syncope, nausea, vomiting or rash.   Past Medical History  Diagnosis Date  . Allergy   . Arthritis   . Hypertension   . GERD (gastroesophageal reflux disease)   . Chronic pain   . Hyperlipidemia   . Gout   . H/O slipped capital femoral epiphysis (SCFE)    Past Surgical History  Procedure Laterality Date  . Cesarean section    . Tubal ligation    . Total hip arthroplasty  1987    right. Dr. Collier [place]  . Joint replacement  [place]  . Hip fusion      left   . Rotator cuff repair      bilateral  . Cardiac catheterization  2005    normal-Hochrein   Family History  Problem Relation Age of Onset  . Depression Mother   . Diabetes Mother   . Hypertension Mother   . Stroke Mother   . Heart disease Mother   . Cancer Father    unknown primary  . Diabetes Sister    Social History  Substance Use Topics  . Smoking status: Current Every Day Smoker -- 0.50 packs/day  . Smokeless tobacco: None     Comment: decreased smoking/1 pack every 3 days  . Alcohol Use: Yes     Comment: on holidays   OB History    No data available     Review of Systems  Constitutional: Negative for fever and chills.  HENT: Negative for congestion, dental problem, facial swelling, rhinorrhea and sore throat.   Eyes: Negative for pain and visual disturbance.  Respiratory: Positive for cough and shortness of breath.   Cardiovascular: Positive for chest pain. Negative for palpitations.  Gastrointestinal: Negative for nausea and vomiting.  Genitourinary: Positive for flank pain.  Musculoskeletal: Negative for myalgias, back pain, joint swelling, arthralgias, gait problem and neck pain.  Skin: Negative for rash and wound.  Neurological: Negative for dizziness, syncope, weakness and headaches.  Psychiatric/Behavioral: Negative for confusion.  All other systems reviewed and are negative.     Allergies  Morphine and related  Home Medications   Prior to Admission medications   Medication Sig Start Date End Date Taking? Authorizing Provider  albuterol (PROVENTIL HFA;VENTOLIN HFA) 108 (90 BASE) MCG/ACT inhaler Inhale 2 puffs into the lungs  every 4 (four) hours as needed for wheezing or shortness of breath. 09/20/15  Yes Melony Overly, MD  aspirin 81 MG tablet Take 1 tablet (81 mg total) by mouth daily. 04/16/15  Yes Leeanne Rio, MD  furosemide (LASIX) 20 MG tablet TAKE 1 TABLET (20 MG TOTAL) BY MOUTH DAILY. 08/24/15  Yes Elberta Leatherwood, MD  HYDROcodone-acetaminophen (NORCO) 5-325 MG tablet Take 1 tablet by mouth 2 (two) times daily as needed. Do not fill until 09/25/15 Patient taking differently: Take 1 tablet by mouth 2 (two) times daily as needed for moderate pain or severe pain. Do not fill until 09/25/15 09/15/15  Yes Elberta Leatherwood,  MD  lisinopril (PRINIVIL,ZESTRIL) 20 MG tablet Take 2 tablets (40 mg total) by mouth daily. 09/15/15  Yes Elberta Leatherwood, MD  meloxicam (MOBIC) 15 MG tablet Take 1 tablet (15 mg total) by mouth daily. Patient taking differently: Take 15 mg by mouth daily as needed for pain.  09/15/15  Yes Elberta Leatherwood, MD  metoprolol succinate (TOPROL-XL) 50 MG 24 hr tablet TAKE 1 TABLET BY MOUTH DAILY WITH OR JUST AFTER MEAL. 07/28/15  Yes Elberta Leatherwood, MD  pantoprazole (PROTONIX) 20 MG tablet TAKE 2 TABLETS (40 MG TOTAL) BY MOUTH DAILY. 05/18/15  Yes Elberta Leatherwood, MD  triamcinolone ointment (KENALOG) 0.5 % Apply 1 application topically 2 (two) times daily. To palms 07/01/15  Yes Elberta Leatherwood, MD  cephALEXin (KEFLEX) 500 MG capsule Take 1 capsule (500 mg total) by mouth 4 (four) times daily. Patient not taking: Reported on 10/03/2015 09/20/15   Melony Overly, MD  cetirizine (ZYRTEC) 10 MG tablet Take 1 tablet (10 mg total) by mouth daily. Patient not taking: Reported on 02/05/2015 05/22/14   Leeanne Rio, MD  Cholecalciferol 50000 UNITS TABS Take 1 tablet by mouth once a week. For 8 weeks Patient not taking: Reported on 10/03/2015 04/03/15   Leeanne Rio, MD  Colchicine 0.6 MG CAPS 1.2 mg PO x1, followed in 1 hour with 0.6mg  PO x1. Then wait 12 hrs and resume 0.6 BID. Patient not taking: Reported on 12/31/2014 01/09/14   Leeanne Rio, MD  D3-50 50000 UNITS capsule TAKE 1 TABLET BY MOUTH ONCE A WEEK. FOR 8 WEEKS Patient not taking: Reported on 10/03/2015 05/18/15   Elberta Leatherwood, MD  predniSONE (DELTASONE) 50 MG tablet Take 1 pill daily for 5 days. Patient not taking: Reported on 10/03/2015 09/20/15   Melony Overly, MD  sucralfate (CARAFATE) 1 G tablet TAKE 1 TABLET 4 TIMES DAILY ON AN EMPTY STOMACH 1 HOUR BEFORE MEALS AND AT BEDTIME Patient not taking: Reported on 10/03/2015 09/04/14   Leeanne Rio, MD  terbinafine (LAMISIL) 1 % cream Apply 1 application topically 2 (two) times daily. Patient not taking:  Reported on 10/03/2015 07/01/15   Elberta Leatherwood, MD   BP 172/100 mmHg  Pulse 84  Temp(Src) 98.6 F (37 C) (Temporal)  Resp 22  Ht 5\' 7"  (1.702 m)  Wt 84.823 kg  BMI 29.28 kg/m2  SpO2 97% Physical Exam  Constitutional: She is oriented to person, place, and time. She appears well-developed and well-nourished. No distress.  HENT:  Head: Normocephalic and atraumatic.  Mouth/Throat: Oropharynx is clear and moist.  Eyes: Conjunctivae and EOM are normal. Pupils are equal, round, and reactive to light. Right eye exhibits no discharge. Left eye exhibits no discharge. No scleral icterus.  Neck: Normal range of motion. Neck supple.  Cardiovascular: Normal rate,  regular rhythm, normal heart sounds and intact distal pulses.   Pulmonary/Chest: Effort normal. No respiratory distress. She has no wheezes. She has no rales. She exhibits no tenderness.  Coarse breath sounds without wheeze or rale. Pt had been given duoneb prior to initial exam. Nurse in triage had noted wheezing which appears to have resolved. Pt noted to have increase work of breathing when she attempts to sit forward or ambulate   Abdominal: Soft. She exhibits no distension. There is no tenderness. There is no rebound and no guarding.  Right flank TTP.  Musculoskeletal: Normal range of motion.  Moves all extremities s  Neurological: She is alert and oriented to person, place, and time. No cranial nerve deficit. Coordination normal.  Skin: Skin is warm and dry.  No rash, vesicles or other skin changes over the right flank  Psychiatric: She has a normal mood and affect. Her behavior is normal.  Nursing note and vitals reviewed.   ED Course  Procedures (including critical care time) Labs Review Labs Reviewed  COMPREHENSIVE METABOLIC PANEL - Abnormal; Notable for the following:    Glucose, Bld 111 (*)    All other components within normal limits  BRAIN NATRIURETIC PEPTIDE - Abnormal; Notable for the following:    B Natriuretic  Peptide 916.9 (*)    All other components within normal limits  CULTURE, EXPECTORATED SPUTUM-ASSESSMENT  CULTURE, RESPIRATORY (NON-EXPECTORATED)  GRAM STAIN  CULTURE, BLOOD (ROUTINE X 2)  CULTURE, BLOOD (ROUTINE X 2)  CBC  URINALYSIS, ROUTINE W REFLEX MICROSCOPIC (NOT AT [facility])  D-DIMER, QUANTITATIVE (NOT AT [facility])  LIPASE, BLOOD  INFLUENZA PANEL BY PCR (TYPE A & B, H1N1)  LIPID PANEL  PROTIME-INR  APTT  STREP PNEUMONIAE URINARY ANTIGEN  TROPONIN I  TROPONIN I  TROPONIN I  MAGNESIUM  URIC ACID  I-STAT TROPOININ, ED    Imaging Review Dg Chest 2 View  10/03/2015  CLINICAL DATA:  A 69 year old female with cough and shortness of breath EXAM: CHEST  2 VIEW COMPARISON:  Chest radiograph dated 09/20/15 FINDINGS: Two views of the chest do not demonstrate any focal consolidation, pleural effusion, or pneumothorax. There is stable mild interstitial prominence appearance of the lungs. Stable cardiac silhouette. There are degenerative changes of the glenohumeral joint with elevation of the humeral head compatible with chronic rotator cuff injury. IMPRESSION: No active cardiopulmonary disease. Electronically Signed   By: Anner Crete M.D.   On: 10/03/2015 20:43   Ct Renal Stone Study  10/03/2015  CLINICAL DATA:  69 year old female with right flank pain radiating to the groin. EXAM: CT ABDOMEN AND PELVIS WITHOUT CONTRAST TECHNIQUE: Multidetector CT imaging of the abdomen and pelvis was performed following the standard protocol without IV contrast. COMPARISON:  CT dated 07/27/2007 FINDINGS: Evaluation of this exam is limited in the absence of intravenous contrast. Evaluation of the pelvic structures is limited due to streak artifact caused by right hip arthroplasty. There are bibasilar interstitial prominence in vascular crowding likely chronic in nature. Developing pneumonia is less likely but not excluded. Clinical correlation is recommended. No intra-abdominal free air or free fluid identified.  The liver, gallbladder, spleen, and adrenal glands appear unremarkable. The pancreas is atrophic. There is no hydronephrosis or nephrolithiasis on either side. An ill-defined 1.5 cm right renal hypodense lesion appears stable compared to the prior study. This lesion is not characterized on this noncontrast study but likely represents a cyst. The visualized ureters and urinary bladder appear unremarkable. Hysterectomy. There is sigmoid diverticulosis with scattered colonic diverticula without  active inflammation. No evidence of bowel obstruction. Normal appendix. Aortoiliac atherosclerotic disease. No portal venous gas identified. There is no adenopathy. Midline vertical anterior pelvic wall incisional scar. Small fat containing umbilical hernia. There is osteopenia with severe degenerative changes of the spine. There is sclerotic changes and disc space narrowing at L3-L4. Grade 1 L4-5 anterolisthesis. No acute fracture. There is stable fusion deformity of the left hip joint. Right hip arthroplasty with extensive resorption of right femur noted. These findings are similar to prior study. IMPRESSION: No hydronephrosis or nephrolithiasis. Diverticulosis.  No evidence of bowel obstruction or inflammation. Electronically Signed   By: Anner Crete M.D.   On: 10/03/2015 22:50   I have personally reviewed and evaluated these images and lab results as part of my medical decision-making.   EKG Interpretation None      MDM   Final diagnoses:  Flank pain  Acute on chronic congestive heart failure, unspecified congestive heart failure type ([place])   69 year old female presenting with worsening productive cough and dyspnea x 2 days as well as persistent right flank pain x 3 weeks since pyelonephritis treatment. Hypertensive to 164/99. Pt is nontoxic appearing. When asked to sit forward in bed, pt becomes dyspneic and has increased effort. This resolves with rest. Lung sounds are coarse though pt had received  duoneb prior to evaluation. Heart RRR. Nonfocal neuro exam. CBC, CMP and UA unremarkable. CT renal stone negative. BNP elevated to 916. Last reading in March was 335. Symptoms likely secondary to CHF exacerbation. Consulted Dr. Blaine Hamper who will admit the patient for CHF exacerbation and diuresis.     Josephina Gip, PA-C 10/04/15 0057  Lacretia Leigh, MD 10/10/15 [phone]

## 2015-10-03 NOTE — H&P (Signed)
[facility] History and Physical  Donna Herrera [id number] DOB: 07/30/1946 DOA: 10/03/2015  Referring physician: ED physician PCP: Georges Lynch, MD  Specialists:   Chief Complaint: Cough, shortness of breath and right flank pain  HPI: Donna Herrera is a 69 y.o. female with PMH of tobacco abuse, systolic congestive heart failure with a year for 40%, hyperlipidemia, GERD, gout, arthritis, allergy, who presents with cough, shortness of breath and a right flank pain.  Patient reports that she has been having cough and shortness of breath in the past 2 days, which has been progressively getting worse. She coughs up streaks of blood and brownish colored sputum. She has intermittent chest pain which is pleuritic, it is aggravated by deep breath and coughing. She does not have tenderness over calf areas. She also has bilateral leg edema. Patient reports that she has right flank pain which has been going on for about 2 weeks. It is constant, 5 out of 10 in severity, radiating to peroneal area. It is worse in the night. Patient does not have dysuria, burning on urination or increased frequency. She has some nausea, but no vomiting, abdominal pain. She reports that she was treated for UTI 2 weeks ago with Keflex. She had diarrhea when she was taking Keflex, which has resolved. Currently the patient does not have diarrhea. Patient does not have fever, chills, unilateral weakness.  In ED, patient was found to have negative urinalysis, BNP 916.9, negative troponin, WBC 6.9, temperature normal, no tachycardia, electrolytes and renal function okay. X-ray has no infiltration. CT scan per renal study protocol is negative for kidney stone or hydronephrosis, but showed diverticulosis without signs of diverticulitis.  Where does patient live?   At home    Can patient participate in ADLs?  Yes     Review of Systems:   General: no fevers, chills, has poor appetite, has fatigue and  HEENT: no blurry  vision, hearing changes or sore throat Pulm: has dyspnea, coughing, wheezing CV: has chest pain, no palpitations Abd: has nausea, no vomiting, abdominal pain, diarrhea, constipation GU: no dysuria, burning on urination, increased urinary frequency, hematuria  Ext: has leg edema Neuro: no unilateral weakness, numbness, or tingling, no vision change or hearing loss Skin: no rash MSK: No muscle spasm, no deformity, no limitation of range of movement in spin. Has R flank pain. Heme: No easy bruising.  Travel history: No recent long distant travel.  Allergy:  Allergies  Allergen Reactions  . Morphine And Related Itching and Swelling    Past Medical History  Diagnosis Date  . Allergy   . Arthritis   . Hypertension   . GERD (gastroesophageal reflux disease)   . Chronic pain   . Hyperlipidemia   . Gout   . H/O slipped capital femoral epiphysis (SCFE)     Past Surgical History  Procedure Laterality Date  . Cesarean section    . Tubal ligation    . Total hip arthroplasty  1987    right. Dr. Collier [place]  . Joint replacement  [place]  . Hip fusion      left   . Rotator cuff repair      bilateral  . Cardiac catheterization  2005    normal-Hochrein    Social History:  reports that she has been smoking.  She does not have any smokeless tobacco history on file. She reports that she drinks alcohol. She reports that she does not use illicit drugs.  Family History:  Family History  Problem Relation Age of Onset  . Depression Mother   . Diabetes Mother   . Hypertension Mother   . Stroke Mother   . Heart disease Mother   . Cancer Father     unknown primary  . Diabetes Sister      Prior to Admission medications   Medication Sig Start Date End Date Taking? Authorizing Provider  albuterol (PROVENTIL HFA;VENTOLIN HFA) 108 (90 BASE) MCG/ACT inhaler Inhale 2 puffs into the lungs every 4 (four) hours as needed for wheezing or shortness of breath. 09/20/15  Yes Melony Overly, MD  aspirin 81 MG tablet Take 1 tablet (81 mg total) by mouth daily. 04/16/15  Yes Leeanne Rio, MD  furosemide (LASIX) 20 MG tablet TAKE 1 TABLET (20 MG TOTAL) BY MOUTH DAILY. 08/24/15  Yes Elberta Leatherwood, MD  HYDROcodone-acetaminophen (NORCO) 5-325 MG tablet Take 1 tablet by mouth 2 (two) times daily as needed. Do not fill until 09/25/15 Patient taking differently: Take 1 tablet by mouth 2 (two) times daily as needed for moderate pain or severe pain. Do not fill until 09/25/15 09/15/15  Yes Elberta Leatherwood, MD  lisinopril (PRINIVIL,ZESTRIL) 20 MG tablet Take 2 tablets (40 mg total) by mouth daily. 09/15/15  Yes Elberta Leatherwood, MD  meloxicam (MOBIC) 15 MG tablet Take 1 tablet (15 mg total) by mouth daily. Patient taking differently: Take 15 mg by mouth daily as needed for pain.  09/15/15  Yes Elberta Leatherwood, MD  metoprolol succinate (TOPROL-XL) 50 MG 24 hr tablet TAKE 1 TABLET BY MOUTH DAILY WITH OR JUST AFTER MEAL. 07/28/15  Yes Elberta Leatherwood, MD  pantoprazole (PROTONIX) 20 MG tablet TAKE 2 TABLETS (40 MG TOTAL) BY MOUTH DAILY. 05/18/15  Yes Elberta Leatherwood, MD  triamcinolone ointment (KENALOG) 0.5 % Apply 1 application topically 2 (two) times daily. To palms 07/01/15  Yes Elberta Leatherwood, MD  cephALEXin (KEFLEX) 500 MG capsule Take 1 capsule (500 mg total) by mouth 4 (four) times daily. Patient not taking: Reported on 10/03/2015 09/20/15   Melony Overly, MD  cetirizine (ZYRTEC) 10 MG tablet Take 1 tablet (10 mg total) by mouth daily. Patient not taking: Reported on 02/05/2015 05/22/14   Leeanne Rio, MD  Cholecalciferol 50000 UNITS TABS Take 1 tablet by mouth once a week. For 8 weeks Patient not taking: Reported on 10/03/2015 04/03/15   Leeanne Rio, MD  Colchicine 0.6 MG CAPS 1.2 mg PO x1, followed in 1 hour with 0.6mg  PO x1. Then wait 12 hrs and resume 0.6 BID. Patient not taking: Reported on 12/31/2014 01/09/14   Leeanne Rio, MD  D3-50 50000 UNITS capsule TAKE 1 TABLET BY MOUTH ONCE A  WEEK. FOR 8 WEEKS Patient not taking: Reported on 10/03/2015 05/18/15   Elberta Leatherwood, MD  predniSONE (DELTASONE) 50 MG tablet Take 1 pill daily for 5 days. Patient not taking: Reported on 10/03/2015 09/20/15   Melony Overly, MD  sucralfate (CARAFATE) 1 G tablet TAKE 1 TABLET 4 TIMES DAILY ON AN EMPTY STOMACH 1 HOUR BEFORE MEALS AND AT BEDTIME Patient not taking: Reported on 10/03/2015 09/04/14   Leeanne Rio, MD  terbinafine (LAMISIL) 1 % cream Apply 1 application topically 2 (two) times daily. Patient not taking: Reported on 10/03/2015 07/01/15   Elberta Leatherwood, MD    Physical Exam: Filed Vitals:   10/03/15 1943 10/03/15 1946 10/03/15 2005 10/03/15 2316  BP: 164/99 161/93 170/89 172/100  Pulse: 77 68 73 84  Temp: 98.8 F (37.1 C) 99.6 F (37.6 C)  98.6 F (37 C)  TempSrc: Oral Oral  Temporal  Resp: 18 20 30 22   Height:   5\' 7"  (1.702 m)   Weight:  84.823 kg (187 lb) 84.823 kg (187 lb)   SpO2: 100% 98% 98% 97%   General: Not in acute distress HEENT:       Eyes: PERRL, EOMI, no scleral icterus.       ENT: No discharge from the ears and nose, no pharynx injection, no tonsillar enlargement.        Neck: No JVD, no bruit, no mass felt. Heme: No neck lymph node enlargement. Cardiac: S1/S2, RRR, No murmurs, No gallops or rubs. Pulm: has coarse breathing sound, but no rales, wheezing, rhonchi or rubs. Abd: Soft, nondistended, nontender, no rebound pain, no organomegaly, BS present. Ext: No pitting leg edema bilaterally. 2+DP/PT pulse bilaterally. Musculoskeletal: No joint deformities, No joint redness or warmth, no limitation of ROM in spin. Skin: No rashes.  Neuro: Alert, oriented X3, cranial nerves II-XII grossly intact, muscle strength 5/5 in all extremities, sensation to light touch intact.  Psych: Patient is not psychotic, no suicidal or hemocidal ideation.  Labs on Admission:  Basic Metabolic Panel:  Recent Labs Lab 09/29/15 1541 10/03/15 2045  NA 140 139  K 3.5 3.7  CL  103 104  CO2 30 25  GLUCOSE 102* 111*  BUN 10 14  CREATININE 0.68 0.68  CALCIUM 8.8 9.0   Liver Function Tests:  Recent Labs Lab 10/03/15 2045  AST 23  ALT 26  ALKPHOS 102  BILITOT 1.0  PROT 7.8  ALBUMIN 3.9   No results for input(s): LIPASE, AMYLASE in the last 168 hours. No results for input(s): AMMONIA in the last 168 hours. CBC:  Recent Labs Lab 10/03/15 2045  WBC 6.9  HGB 12.1  HCT 38.4  MCV 91.2  PLT 258   Cardiac Enzymes: No results for input(s): CKTOTAL, CKMB, CKMBINDEX, TROPONINI in the last 168 hours.  BNP (last 3 results)  Recent Labs  10/03/15 2052  BNP 916.9*    ProBNP (last 3 results) No results for input(s): PROBNP in the last 8760 hours.  CBG: No results for input(s): GLUCAP in the last 168 hours.  Radiological Exams on Admission: Dg Chest 2 View  10/03/2015  CLINICAL DATA:  A 69 year old female with cough and shortness of breath EXAM: CHEST  2 VIEW COMPARISON:  Chest radiograph dated 09/20/15 FINDINGS: Two views of the chest do not demonstrate any focal consolidation, pleural effusion, or pneumothorax. There is stable mild interstitial prominence appearance of the lungs. Stable cardiac silhouette. There are degenerative changes of the glenohumeral joint with elevation of the humeral head compatible with chronic rotator cuff injury. IMPRESSION: No active cardiopulmonary disease. Electronically Signed   By: Anner Crete M.D.   On: 10/03/2015 20:43   Ct Renal Stone Study  10/03/2015  CLINICAL DATA:  69 year old female with right flank pain radiating to the groin. EXAM: CT ABDOMEN AND PELVIS WITHOUT CONTRAST TECHNIQUE: Multidetector CT imaging of the abdomen and pelvis was performed following the standard protocol without IV contrast. COMPARISON:  CT dated 07/27/2007 FINDINGS: Evaluation of this exam is limited in the absence of intravenous contrast. Evaluation of the pelvic structures is limited due to streak artifact caused by right hip  arthroplasty. There are bibasilar interstitial prominence in vascular crowding likely chronic in nature. Developing pneumonia is less likely but not excluded. Clinical  correlation is recommended. No intra-abdominal free air or free fluid identified. The liver, gallbladder, spleen, and adrenal glands appear unremarkable. The pancreas is atrophic. There is no hydronephrosis or nephrolithiasis on either side. An ill-defined 1.5 cm right renal hypodense lesion appears stable compared to the prior study. This lesion is not characterized on this noncontrast study but likely represents a cyst. The visualized ureters and urinary bladder appear unremarkable. Hysterectomy. There is sigmoid diverticulosis with scattered colonic diverticula without active inflammation. No evidence of bowel obstruction. Normal appendix. Aortoiliac atherosclerotic disease. No portal venous gas identified. There is no adenopathy. Midline vertical anterior pelvic wall incisional scar. Small fat containing umbilical hernia. There is osteopenia with severe degenerative changes of the spine. There is sclerotic changes and disc space narrowing at L3-L4. Grade 1 L4-5 anterolisthesis. No acute fracture. There is stable fusion deformity of the left hip joint. Right hip arthroplasty with extensive resorption of right femur noted. These findings are similar to prior study. IMPRESSION: No hydronephrosis or nephrolithiasis. Diverticulosis.  No evidence of bowel obstruction or inflammation. Electronically Signed   By: Anner Crete M.D.   On: 10/03/2015 22:50    EKG: Independently reviewed. QTC 471, no ischemic change   Assessment/Plan Principal Problem:   Acute respiratory failure (HCC) Active Problems:   Hypertension   DJD (degenerative joint disease)   Gout   GERD (gastroesophageal reflux disease)   Hyperlipidemia   Tobacco abuse   Dyspnea   Acute on chronic systolic CHF (congestive heart failure) (HCC)   Flank pain, right    Cough  Acuterespiratory failure: Most likely due to combination of sCHF and bronchitis/COPD. Patient has bilateral pitting leg edema plus elevated BNP, consistent with CHF exacerbation.  she has hx of smoking, productive cough and coarse breathing sounds, indicating bronchitis or possible undiagnosed CODP. Given her hemoptysis and intermittent chest pain, need to rule out PE  -will admit to tele bed -treat COPD and CHF exacerbation as below -r/o PE-->check D-dimer. If positive, will get CTA  Systolic congestive heart failure exacerbation: 2-D echo  12/1814 showed EF 40 % with grade 1 diastolic dysfunction. Patient is on low-dose Lasix, 20 mg daily. She has bilateral leg edema and elevated BNP, consistent with CHF exacerbation. -Lasix 40 mg IV daily -Trop x 3 -2d echo -continue ASA, lisinopril and metoprolol -Daily weights -strict I/O's -Low salt diet  Cough: likely due to bronchitis. No infiltration on chest x-ray. Patient may have undiagnosed COPD given her long history of smoking. -Nebulizers: scheduled Duoneb and prn albuterol -Solu-Medrol 60 mg IV bid  -Oral azithromycin for 5 days.  -Mucinex for cough  -Follow up sputum culture and blood culture -Pt need outpt PFT test  Hypertension: -Continue lisinopril, metoprolol -On Lasix IV  Gout: stable. Used to be on Colchicine, not taking this med currently. -check Uric acid level  HLD: Last LDL was 140 on /2/16, not taking medications at home. -Check FLP  GERD: -Protonix  Tobacco abuse: -Did counseling about importance of quitting smoking -Nicotine patch  Flank pain, right: Etiology is not clear. Urinalysis negative. CT per renal stone protocol is negative for kidney stone. Likely due to muscle straining. -Percocet, Mobic -Check lipase  DVT ppx: SQ Heparin    Code Status: Full code Family Communication:  Yes, patient's daughter at bed side Disposition Plan: Admit to inpatient   Date of Service 10/04/2015    Ivor Costa [facility] Pager [phone]  If 7PM-7AM, please contact night-coverage [email or web address] Password [email or web address] 10/04/2015, 12:34 AM

## 2015-10-03 NOTE — ED Notes (Addendum)
Pt from home c/o cough with yellow and brown expectorant with occasional blood spots. She also is c/o right flank pain that radiated to right groin. She reports she was diagnosed with a kidney infection and has completed treatment. Expiratory wheezes present.

## 2015-10-03 NOTE — ED Notes (Signed)
Delay on lab draw pt in X-ray

## 2015-10-04 ENCOUNTER — Inpatient Hospital Stay (HOSPITAL_COMMUNITY): Payer: Medicare Other

## 2015-10-04 ENCOUNTER — Encounter (HOSPITAL_COMMUNITY): Payer: Self-pay | Admitting: Radiology

## 2015-10-04 DIAGNOSIS — J189 Pneumonia, unspecified organism: Secondary | ICD-10-CM

## 2015-10-04 DIAGNOSIS — I10 Essential (primary) hypertension: Secondary | ICD-10-CM

## 2015-10-04 DIAGNOSIS — J441 Chronic obstructive pulmonary disease with (acute) exacerbation: Secondary | ICD-10-CM

## 2015-10-04 DIAGNOSIS — I5023 Acute on chronic systolic (congestive) heart failure: Secondary | ICD-10-CM

## 2015-10-04 DIAGNOSIS — J984 Other disorders of lung: Secondary | ICD-10-CM | POA: Insufficient documentation

## 2015-10-04 DIAGNOSIS — R109 Unspecified abdominal pain: Secondary | ICD-10-CM

## 2015-10-04 DIAGNOSIS — J9600 Acute respiratory failure, unspecified whether with hypoxia or hypercapnia: Secondary | ICD-10-CM

## 2015-10-04 LAB — CBC
HEMATOCRIT: 40.1 % (ref 36.0–46.0)
HEMOGLOBIN: 12.8 g/dL (ref 12.0–15.0)
MCH: 28.6 pg (ref 26.0–34.0)
MCHC: 31.9 g/dL (ref 30.0–36.0)
MCV: 89.5 fL (ref 78.0–100.0)
Platelets: 215 10*3/uL (ref 150–400)
RBC: 4.48 MIL/uL (ref 3.87–5.11)
RDW: 14.3 % (ref 11.5–15.5)
WBC: 7.4 10*3/uL (ref 4.0–10.5)

## 2015-10-04 LAB — BASIC METABOLIC PANEL
ANION GAP: 13 (ref 5–15)
BUN: 13 mg/dL (ref 6–20)
CALCIUM: 9.3 mg/dL (ref 8.9–10.3)
CHLORIDE: 101 mmol/L (ref 101–111)
CO2: 22 mmol/L (ref 22–32)
Creatinine, Ser: 0.74 mg/dL (ref 0.44–1.00)
GFR calc Af Amer: >60 mL/min (ref >60)
GFR calc non Af Amer: >60 mL/min (ref >60)
GLUCOSE: 158 mg/dL — AB (ref 65–99)
Potassium: 3.5 mmol/L (ref 3.5–5.1)
Sodium: 136 mmol/L (ref 135–145)

## 2015-10-04 LAB — MAGNESIUM: MAGNESIUM: 2.1 mg/dL (ref 1.7–2.4)

## 2015-10-04 LAB — LIPID PANEL
Cholesterol: 177 mg/dL (ref 0–200)
HDL: 50 mg/dL (ref >40)
LDL CALC: 111 mg/dL — AB (ref 0–99)
Total CHOL/HDL Ratio: 3.5 RATIO
Triglycerides: 78 mg/dL (ref <150)
VLDL: 16 mg/dL (ref 0–40)

## 2015-10-04 LAB — TSH: TSH: 0.582 u[IU]/mL (ref 0.350–4.500)

## 2015-10-04 LAB — LIPASE, BLOOD: LIPASE: 21 U/L (ref 11–51)

## 2015-10-04 LAB — TROPONIN I
TROPONIN I: 0.04 ng/mL — AB (ref <0.031)
TROPONIN I: 0.03 ng/mL (ref <0.031)

## 2015-10-04 LAB — STREP PNEUMONIAE URINARY ANTIGEN: STREP PNEUMO URINARY ANTIGEN: POSITIVE — AB

## 2015-10-04 LAB — PROTIME-INR
INR: 1.19 (ref 0.00–1.49)
Prothrombin Time: 15.2 seconds (ref 11.6–15.2)

## 2015-10-04 LAB — URIC ACID: Uric Acid, Serum: 7.2 mg/dL — ABNORMAL HIGH (ref 2.3–6.6)

## 2015-10-04 LAB — INFLUENZA PANEL BY PCR (TYPE A & B)
H1N1FLUPCR: NOT DETECTED
INFLAPCR: NEGATIVE
INFLBPCR: NEGATIVE

## 2015-10-04 LAB — APTT: aPTT: 34 seconds (ref 24–37)

## 2015-10-04 LAB — D-DIMER, QUANTITATIVE (NOT AT ARMC): D DIMER QUANT: 2.72 ug{FEU}/mL — AB (ref 0.00–0.50)

## 2015-10-04 MED ORDER — PANTOPRAZOLE SODIUM 40 MG PO TBEC
40.0000 mg | DELAYED_RELEASE_TABLET | Freq: Every day | ORAL | Status: DC
Start: 2015-10-04 — End: 2015-10-06
  Administered 2015-10-04 – 2015-10-06 (×3): 40 mg via ORAL
  Filled 2015-10-04 (×3): qty 1

## 2015-10-04 MED ORDER — COLCHICINE 0.6 MG PO TABS
0.6000 mg | ORAL_TABLET | Freq: Two times a day (BID) | ORAL | Status: DC
  Administered 2015-10-05 – 2015-10-06 (×3): 0.6 mg via ORAL
  Filled 2015-10-04 (×4): qty 1

## 2015-10-04 MED ORDER — IPRATROPIUM-ALBUTEROL 0.5-2.5 (3) MG/3ML IN SOLN
3.0000 mL | RESPIRATORY_TRACT | Status: DC
  Administered 2015-10-04: 3 mL via RESPIRATORY_TRACT
  Filled 2015-10-04: qty 3

## 2015-10-04 MED ORDER — ACETAMINOPHEN 325 MG PO TABS: 650.0000 mg | ORAL_TABLET | ORAL | Status: DC | PRN

## 2015-10-04 MED ORDER — FUROSEMIDE 10 MG/ML IJ SOLN: 40.0000 mg | Freq: Every day | INTRAMUSCULAR | Status: DC

## 2015-10-04 MED ORDER — FUROSEMIDE 10 MG/ML IJ SOLN
40.0000 mg | Freq: Every day | INTRAMUSCULAR | Status: DC
  Administered 2015-10-04: 40 mg via INTRAVENOUS
  Filled 2015-10-04: qty 4

## 2015-10-04 MED ORDER — SODIUM CHLORIDE 0.9 % IJ SOLN: 3.0000 mL | INTRAMUSCULAR | Status: DC | PRN

## 2015-10-04 MED ORDER — METHOCARBAMOL 500 MG PO TABS
500.0000 mg | ORAL_TABLET | Freq: Four times a day (QID) | ORAL | Status: DC | PRN
  Administered 2015-10-04 – 2015-10-06 (×6): 500 mg via ORAL
  Filled 2015-10-04 (×6): qty 1

## 2015-10-04 MED ORDER — SODIUM CHLORIDE 0.9 % IJ SOLN
3.0000 mL | Freq: Two times a day (BID) | INTRAMUSCULAR | Status: DC
  Administered 2015-10-04 – 2015-10-06 (×6): 3 mL via INTRAVENOUS

## 2015-10-04 MED ORDER — METOPROLOL SUCCINATE ER 50 MG PO TB24
50.0000 mg | ORAL_TABLET | Freq: Every day | ORAL | Status: DC
  Administered 2015-10-04 – 2015-10-05 (×2): 50 mg via ORAL
  Filled 2015-10-04 (×2): qty 1

## 2015-10-04 MED ORDER — FUROSEMIDE 10 MG/ML IJ SOLN
20.0000 mg | Freq: Once | INTRAMUSCULAR | Status: AC
Start: 2015-10-04 — End: 2015-10-04
  Administered 2015-10-04: 20 mg via INTRAVENOUS
  Filled 2015-10-04: qty 2

## 2015-10-04 MED ORDER — IPRATROPIUM-ALBUTEROL 0.5-2.5 (3) MG/3ML IN SOLN
3.0000 mL | Freq: Three times a day (TID) | RESPIRATORY_TRACT | Status: DC
  Administered 2015-10-04 – 2015-10-06 (×7): 3 mL via RESPIRATORY_TRACT
  Filled 2015-10-04 (×7): qty 3

## 2015-10-04 MED ORDER — AZITHROMYCIN 500 MG PO TABS
500.0000 mg | ORAL_TABLET | Freq: Every day | ORAL | Status: AC
  Administered 2015-10-04: 500 mg via ORAL
  Filled 2015-10-04: qty 1

## 2015-10-04 MED ORDER — POTASSIUM CHLORIDE CRYS ER 20 MEQ PO TBCR
40.0000 meq | EXTENDED_RELEASE_TABLET | Freq: Once | ORAL | Status: AC
Start: 2015-10-04 — End: 2015-10-04
  Administered 2015-10-04: 40 meq via ORAL
  Filled 2015-10-04: qty 2

## 2015-10-04 MED ORDER — FUROSEMIDE 10 MG/ML IJ SOLN: 40.0000 mg | Freq: Two times a day (BID) | INTRAMUSCULAR | Status: DC

## 2015-10-04 MED ORDER — POTASSIUM CHLORIDE CRYS ER 20 MEQ PO TBCR
40.0000 meq | EXTENDED_RELEASE_TABLET | Freq: Once | ORAL | Status: AC
  Administered 2015-10-04: 40 meq via ORAL
  Filled 2015-10-04: qty 2

## 2015-10-04 MED ORDER — HEPARIN SODIUM (PORCINE) 5000 UNIT/ML IJ SOLN
5000.0000 [IU] | Freq: Three times a day (TID) | INTRAMUSCULAR | Status: DC
  Administered 2015-10-04 – 2015-10-05 (×4): 5000 [IU] via SUBCUTANEOUS
  Filled 2015-10-04 (×9): qty 1

## 2015-10-04 MED ORDER — TRIAMCINOLONE ACETONIDE 0.5 % EX OINT
1.0000 | TOPICAL_OINTMENT | Freq: Two times a day (BID) | CUTANEOUS | Status: DC
Start: 2015-10-04 — End: 2015-10-06
  Administered 2015-10-04 (×2): 1 via TOPICAL
  Filled 2015-10-04 (×2): qty 15

## 2015-10-04 MED ORDER — METHOCARBAMOL 500 MG PO TABS
500.0000 mg | ORAL_TABLET | Freq: Three times a day (TID) | ORAL | Status: DC
Start: 2015-10-04 — End: 2015-10-04

## 2015-10-04 MED ORDER — NICOTINE 21 MG/24HR TD PT24
21.0000 mg | MEDICATED_PATCH | Freq: Every day | TRANSDERMAL | Status: DC
Start: 2015-10-04 — End: 2015-10-06
  Filled 2015-10-04 (×3): qty 1

## 2015-10-04 MED ORDER — OXYCODONE HCL 5 MG PO TABS
5.0000 mg | ORAL_TABLET | Freq: Once | ORAL | Status: AC
  Administered 2015-10-04: 5 mg via ORAL
  Filled 2015-10-04: qty 1

## 2015-10-04 MED ORDER — SODIUM CHLORIDE 0.9 % IV SOLN: 250.0000 mL | INTRAVENOUS | Status: DC | PRN

## 2015-10-04 MED ORDER — ASPIRIN 81 MG PO CHEW
81.0000 mg | CHEWABLE_TABLET | Freq: Every day | ORAL | Status: DC
Start: 2015-10-04 — End: 2015-10-06
  Administered 2015-10-04 – 2015-10-06 (×3): 81 mg via ORAL
  Filled 2015-10-04 (×3): qty 1

## 2015-10-04 MED ORDER — DM-GUAIFENESIN ER 30-600 MG PO TB12
1.0000 | ORAL_TABLET | Freq: Two times a day (BID) | ORAL | Status: DC
  Administered 2015-10-04 – 2015-10-06 (×5): 1 via ORAL
  Filled 2015-10-04 (×6): qty 1

## 2015-10-04 MED ORDER — ALBUTEROL SULFATE (2.5 MG/3ML) 0.083% IN NEBU: 5.0000 mg | INHALATION_SOLUTION | RESPIRATORY_TRACT | Status: DC | PRN

## 2015-10-04 MED ORDER — METHOCARBAMOL 1000 MG/10ML IJ SOLN
500.0000 mg | Freq: Four times a day (QID) | INTRAVENOUS | Status: DC | PRN
  Filled 2015-10-04: qty 5

## 2015-10-04 MED ORDER — AZITHROMYCIN 250 MG PO TABS
250.0000 mg | ORAL_TABLET | Freq: Every day | ORAL | Status: DC
Start: 2015-10-05 — End: 2015-10-06
  Administered 2015-10-05 – 2015-10-06 (×2): 250 mg via ORAL
  Filled 2015-10-04 (×2): qty 1

## 2015-10-04 MED ORDER — LISINOPRIL 40 MG PO TABS
40.0000 mg | ORAL_TABLET | Freq: Every day | ORAL | Status: DC
  Administered 2015-10-04 – 2015-10-06 (×3): 40 mg via ORAL
  Filled 2015-10-04 (×3): qty 1

## 2015-10-04 MED ORDER — MELOXICAM 15 MG PO TABS
15.0000 mg | ORAL_TABLET | Freq: Every day | ORAL | Status: DC
  Filled 2015-10-04: qty 1

## 2015-10-04 MED ORDER — IBUPROFEN 600 MG PO TABS
600.0000 mg | ORAL_TABLET | Freq: Three times a day (TID) | ORAL | Status: DC
  Administered 2015-10-04 – 2015-10-06 (×7): 600 mg via ORAL
  Filled 2015-10-04 (×8): qty 1

## 2015-10-04 MED ORDER — HYDRALAZINE HCL 20 MG/ML IJ SOLN
10.0000 mg | Freq: Four times a day (QID) | INTRAMUSCULAR | Status: DC | PRN
  Administered 2015-10-04 – 2015-10-06 (×3): 10 mg via INTRAVENOUS
  Filled 2015-10-04 (×3): qty 1

## 2015-10-04 MED ORDER — METHYLPREDNISOLONE SODIUM SUCC 125 MG IJ SOLR
60.0000 mg | Freq: Two times a day (BID) | INTRAMUSCULAR | Status: DC
  Administered 2015-10-04 – 2015-10-05 (×4): 60 mg via INTRAVENOUS
  Filled 2015-10-04 (×4): qty 0.96

## 2015-10-04 MED ORDER — IOHEXOL 350 MG/ML SOLN
100.0000 mL | Freq: Once | INTRAVENOUS | Status: AC | PRN
  Administered 2015-10-04: 100 mL via INTRAVENOUS

## 2015-10-04 MED ORDER — DEXTROSE 5 % IV SOLN
2.0000 g | Freq: Every day | INTRAVENOUS | Status: DC
  Administered 2015-10-04 – 2015-10-06 (×3): 2 g via INTRAVENOUS
  Filled 2015-10-04 (×3): qty 2

## 2015-10-04 MED ORDER — POTASSIUM CHLORIDE CRYS ER 20 MEQ PO TBCR
40.0000 meq | EXTENDED_RELEASE_TABLET | Freq: Every day | ORAL | Status: DC
  Administered 2015-10-05 – 2015-10-06 (×2): 40 meq via ORAL
  Filled 2015-10-04 (×2): qty 2

## 2015-10-04 MED ORDER — ONDANSETRON HCL 4 MG/2ML IJ SOLN: 4.0000 mg | Freq: Four times a day (QID) | INTRAMUSCULAR | Status: DC | PRN

## 2015-10-04 MED ORDER — HYDROCODONE-ACETAMINOPHEN 5-325 MG PO TABS
1.0000 | ORAL_TABLET | Freq: Two times a day (BID) | ORAL | Status: DC | PRN
  Administered 2015-10-04 – 2015-10-06 (×5): 1 via ORAL
  Filled 2015-10-04 (×5): qty 1

## 2015-10-04 NOTE — Progress Notes (Signed)
[facility] PROGRESS NOTE  Donna Herrera [id number] DOB: 1946-05-05 DOA: 10/03/2015 PCP: Georges Lynch, MD  Assessment/Plan: #1 acute respiratory failure Likely multifactorial secondary to acute CHF exacerbation, community-acquired pneumonia, bronchitis/COPD. patient with a history of tobacco abuse presenting with a productive cough and diffuse crackles noted on examination. Urine streptococcal pneumonia was positive. Patient with elevated BNP of 916.9. Patient with some clinical improvement after IV Lasix have a complaint of significant cramping. Fasting lipid panel with LDL of 111. Will cycle cardiac enzymes every 6 hours 3. Check a 2-D echo. Continue Lasix 40 mg IV daily. Continue aspirin, lisinopril, Toprol-XL. Continue azithromycin, nebulizers, Mucinex, IV steroid taper. Start IV Rocephin. Follow.  #2 community acquired pneumonia Patient presented with cough, shortness of breath. Chest x-ray consistent with volume overload. Patient with urine Streptococcus pneumonia positive. Urine Legionella antigen pending. Check a sputum Gram stain and culture. Continue azithromycin. Will add IV Rocephin. Follow.  #3 acute on chronic systolic and diastolic CHF exacerbation Patient presenting with shortness of breath, signs of volume overload on examination, elevated BNP of 916.9. Patient complaining of significant cramping. I/O = -1.8 L. Cycle cardiac enzymes every 6 hours 3. Check a 2-D echo. Continue daily IV Lasix. Continue aspirin, lisinopril, Toprol. Strict I's and O's. Daily weights. Follow.  #4 probable COPD exacerbation Patient with a cough. Urine strep pneumococcus antigen is positive. Some clinical improvement. Continue IV steroids, azithromycin, Mucinex, scheduled nebulizers.  5 hypertension Continue lisinopril, metoprolol, IV Lasix. Follow.  #6 gout Stable. Uric acid is elevated. Will place on colchicine.  #7 hyperlipidemia LDL was 111. Will hold off on a statin for now  secondary to patient's cramping.  #8 gastroesophageal reflux disease PPI.  #9 tobacco abuse declined tobacco cessation. Nicotine patch.  #10 right flank pain Likely musculoskeletal in nature. CT stone protocol was negative for kidney stone. We'll place on scheduled ibuprofen.  #11 prophylaxis  PPI for GI prophylaxis heparin for DVT prophylaxis.  Code Status: Full Family Communication: Updated patient and family at bedside. Disposition Plan: Home with shortness of breath has improved and CHF exacerbation is compensated and pneumonia treated.   Consultants:  None  Procedures:  CTI to chest 10/04/2015  CT renal stone protocol 10/03/2015  Chest x-ray 10/03/2015  Antibiotics:  Oral azithromycin 10/05/2015  IV Rocephin 10/04/2015    HPI/Subjective: Patient states shortness of breath improved. Patient complaining of severe cramping in both legs and the left hand.  Objective: Filed Vitals:   10/04/15 0420 10/04/15 1012  BP: 165/95 153/98  Pulse: 75   Temp: 98.7 F (37.1 C)   Resp: 20     Intake/Output Summary (Last 24 hours) at 10/04/15 1109 Last data filed at 10/04/15 0452  Gross per 24 hour  Intake      0 ml  Output   1850 ml  Net  -1850 ml   Filed Weights   10/03/15 1946 10/03/15 2005 10/04/15 0117  Weight: 84.823 kg (187 lb) 84.823 kg (187 lb) 83.1 kg (183 lb 3.2 oz)    Exam:   General:  In discomfort.  Cardiovascular: RRR. + JVD  Respiratory: Diffuse crackles  Abdomen: Soft/NT/ND/+BS  Musculoskeletal:No c/c/e  Data Reviewed: Basic Metabolic Panel:  Recent Labs Lab 09/29/15 1541 10/03/15 2045 10/04/15 0500  NA 140 139  --   K 3.5 3.7  --   CL 103 104  --   CO2 30 25  --   GLUCOSE 102* 111*  --   BUN 10 14  --   CREATININE  0.68 0.68  --   CALCIUM 8.8 9.0  --   MG  --   --  2.1   Liver Function Tests:  Recent Labs Lab 10/03/15 2045  AST 23  ALT 26  ALKPHOS 102  BILITOT 1.0  PROT 7.8  ALBUMIN 3.9    Recent Labs Lab  10/04/15 0141  LIPASE 21   No results for input(s): AMMONIA in the last 168 hours. CBC:  Recent Labs Lab 10/03/15 2045  WBC 6.9  HGB 12.1  HCT 38.4  MCV 91.2  PLT 258   Cardiac Enzymes:  Recent Labs Lab 10/04/15 0141 10/04/15 0800  TROPONINI 0.04* 0.03   BNP (last 3 results)  Recent Labs  10/03/15 2052  BNP 916.9*    ProBNP (last 3 results) No results for input(s): PROBNP in the last 8760 hours.  CBG: No results for input(s): GLUCAP in the last 168 hours.  Recent Results (from the past 240 hour(s))  Culture, expectorated sputum-assessment     Status: None   Collection Time: 10/03/15  8:49 PM  Result Value Ref Range Status   Specimen Description SPUTUM  Final   Special Requests Normal  Final   Sputum evaluation THIS SPECIMEN IS ACCEPTABLE FOR SPUTUM CULTURE  Final   Report Status 10/03/2015 FINAL  Final  Culture, blood (routine x 2) Call MD if unable to obtain prior to antibiotics being given     Status: None (Preliminary result)   Collection Time: 10/04/15  1:30 AM  Result Value Ref Range Status   Specimen Description BLOOD LEFT ARM Performed at [facility]   Final   Special Requests BOTTLES DRAWN AEROBIC ONLY 5CC  Final   Culture PENDING  Incomplete   Report Status PENDING  Incomplete  Culture, blood (routine x 2) Call MD if unable to obtain prior to antibiotics being given     Status: None (Preliminary result)   Collection Time: 10/04/15  1:41 AM  Result Value Ref Range Status   Specimen Description BLOOD LEFT HAND  Final   Special Requests   Final    IN PEDIATRIC BOTTLE 2CC Performed at [facility]    Culture PENDING  Incomplete   Report Status PENDING  Incomplete     Studies: Dg Chest 2 View  10/03/2015  CLINICAL DATA:  A 69 year old female with cough and shortness of breath EXAM: CHEST  2 VIEW COMPARISON:  Chest radiograph dated 09/20/15 FINDINGS: Two views of the chest do not demonstrate any focal consolidation, pleural  effusion, or pneumothorax. There is stable mild interstitial prominence appearance of the lungs. Stable cardiac silhouette. There are degenerative changes of the glenohumeral joint with elevation of the humeral head compatible with chronic rotator cuff injury. IMPRESSION: No active cardiopulmonary disease. Electronically Signed   By: Anner Crete M.D.   On: 10/03/2015 20:43   Ct Renal Stone Study  10/03/2015  CLINICAL DATA:  69 year old female with right flank pain radiating to the groin. EXAM: CT ABDOMEN AND PELVIS WITHOUT CONTRAST TECHNIQUE: Multidetector CT imaging of the abdomen and pelvis was performed following the standard protocol without IV contrast. COMPARISON:  CT dated 07/27/2007 FINDINGS: Evaluation of this exam is limited in the absence of intravenous contrast. Evaluation of the pelvic structures is limited due to streak artifact caused by right hip arthroplasty. There are bibasilar interstitial prominence in vascular crowding likely chronic in nature. Developing pneumonia is less likely but not excluded. Clinical correlation is recommended. No intra-abdominal free air or free fluid identified. The  liver, gallbladder, spleen, and adrenal glands appear unremarkable. The pancreas is atrophic. There is no hydronephrosis or nephrolithiasis on either side. An ill-defined 1.5 cm right renal hypodense lesion appears stable compared to the prior study. This lesion is not characterized on this noncontrast study but likely represents a cyst. The visualized ureters and urinary bladder appear unremarkable. Hysterectomy. There is sigmoid diverticulosis with scattered colonic diverticula without active inflammation. No evidence of bowel obstruction. Normal appendix. Aortoiliac atherosclerotic disease. No portal venous gas identified. There is no adenopathy. Midline vertical anterior pelvic wall incisional scar. Small fat containing umbilical hernia. There is osteopenia with severe degenerative changes of the  spine. There is sclerotic changes and disc space narrowing at L3-L4. Grade 1 L4-5 anterolisthesis. No acute fracture. There is stable fusion deformity of the left hip joint. Right hip arthroplasty with extensive resorption of right femur noted. These findings are similar to prior study. IMPRESSION: No hydronephrosis or nephrolithiasis. Diverticulosis.  No evidence of bowel obstruction or inflammation. Electronically Signed   By: Anner Crete M.D.   On: 10/03/2015 22:50    Scheduled Meds: . aspirin  81 mg Oral Daily  . azithromycin  500 mg Oral Daily   Followed by  . [START ON 10/05/2015] azithromycin  250 mg Oral Daily  . cefTRIAXone (ROCEPHIN)  IV  2 g Intravenous Q24H  . dextromethorphan-guaiFENesin  1 tablet Oral BID  . furosemide  40 mg Intravenous Q12H  . heparin  5,000 Units Subcutaneous 3 times per day  . ibuprofen  600 mg Oral TID  . ipratropium-albuterol  3 mL Nebulization TID  . lisinopril  40 mg Oral Daily  . methylPREDNISolone (SOLU-MEDROL) injection  60 mg Intravenous Q12H  . metoprolol succinate  50 mg Oral Daily  . nicotine  21 mg Transdermal Daily  . pantoprazole  40 mg Oral Daily  . potassium chloride  40 mEq Oral Once  . sodium chloride  3 mL Intravenous Q12H  . triamcinolone ointment  1 application Topical BID   Continuous Infusions:   Principal Problem:   Acute respiratory failure (HCC) Active Problems:   Acute on chronic systolic CHF (congestive heart failure) (HCC)   CAP (community acquired pneumonia)   Hypertension   DJD (degenerative joint disease)   Gout   GERD (gastroesophageal reflux disease)   Hyperlipidemia   Cramps, muscle, general   Tobacco abuse   Dyspnea   Flank pain, right   Cough    Time spent: 35 mins    [facility] MD [facility] Pager [phone]. If 7PM-7AM, please contact night-coverage at [email or web address], password [facility] 10/04/2015, 11:09 AM  LOS: 1 day

## 2015-10-04 NOTE — Progress Notes (Signed)
Patient needs new PIV 20 gauge in upper arm for angio CT. Patient refuses to get stuck. Primary RN notified.

## 2015-10-05 ENCOUNTER — Inpatient Hospital Stay (HOSPITAL_COMMUNITY): Payer: Medicare Other

## 2015-10-05 DIAGNOSIS — Z720 Tobacco use: Secondary | ICD-10-CM

## 2015-10-05 DIAGNOSIS — I509 Heart failure, unspecified: Secondary | ICD-10-CM

## 2015-10-05 DIAGNOSIS — R252 Cramp and spasm: Secondary | ICD-10-CM

## 2015-10-05 LAB — BASIC METABOLIC PANEL
Anion gap: 8 (ref 5–15)
BUN: 17 mg/dL (ref 6–20)
CHLORIDE: 104 mmol/L (ref 101–111)
CO2: 25 mmol/L (ref 22–32)
CREATININE: 0.73 mg/dL (ref 0.44–1.00)
Calcium: 9.3 mg/dL (ref 8.9–10.3)
GFR calc Af Amer: >60 mL/min (ref >60)
GFR calc non Af Amer: >60 mL/min (ref >60)
GLUCOSE: 183 mg/dL — AB (ref 65–99)
POTASSIUM: 4.8 mmol/L (ref 3.5–5.1)
SODIUM: 137 mmol/L (ref 135–145)

## 2015-10-05 LAB — CBC
HEMATOCRIT: 37.4 % (ref 36.0–46.0)
Hemoglobin: 11.8 g/dL — ABNORMAL LOW (ref 12.0–15.0)
MCH: 28.5 pg (ref 26.0–34.0)
MCHC: 31.6 g/dL (ref 30.0–36.0)
MCV: 90.3 fL (ref 78.0–100.0)
Platelets: 271 10*3/uL (ref 150–400)
RBC: 4.14 MIL/uL (ref 3.87–5.11)
RDW: 14.5 % (ref 11.5–15.5)
WBC: 9.7 10*3/uL (ref 4.0–10.5)

## 2015-10-05 LAB — BRAIN NATRIURETIC PEPTIDE: B Natriuretic Peptide: 1015.2 pg/mL — ABNORMAL HIGH (ref 0.0–100.0)

## 2015-10-05 MED ORDER — METOPROLOL SUCCINATE ER 50 MG PO TB24
75.0000 mg | ORAL_TABLET | Freq: Every day | ORAL | Status: DC
  Administered 2015-10-06: 75 mg via ORAL
  Filled 2015-10-05: qty 1

## 2015-10-05 MED ORDER — FUROSEMIDE 10 MG/ML IJ SOLN
20.0000 mg | Freq: Once | INTRAMUSCULAR | Status: AC
  Administered 2015-10-05: 20 mg via INTRAVENOUS
  Filled 2015-10-05: qty 2

## 2015-10-05 MED ORDER — FUROSEMIDE 10 MG/ML IJ SOLN
20.0000 mg | Freq: Every day | INTRAMUSCULAR | Status: DC
  Administered 2015-10-05: 20 mg via INTRAVENOUS
  Filled 2015-10-05: qty 2

## 2015-10-05 MED ORDER — METOPROLOL SUCCINATE ER 25 MG PO TB24
25.0000 mg | ORAL_TABLET | Freq: Once | ORAL | Status: AC
  Administered 2015-10-05: 25 mg via ORAL
  Filled 2015-10-05: qty 1

## 2015-10-05 MED ORDER — PREDNISONE 50 MG PO TABS
60.0000 mg | ORAL_TABLET | Freq: Every day | ORAL | Status: DC
  Administered 2015-10-06: 60 mg via ORAL
  Filled 2015-10-05: qty 1

## 2015-10-05 NOTE — Progress Notes (Signed)
[facility] PROGRESS NOTE  Donna Herrera [id number] DOB: 03-29-1946 DOA: 10/03/2015 PCP: Georges Lynch, MD  Assessment/Plan: #1 acute respiratory failure Likely multifactorial secondary to acute CHF exacerbation, community-acquired pneumonia, bronchitis/COPD. patient with a history of tobacco abuse presenting with a productive cough and diffuse crackles noted on examination. Urine streptococcal pneumonia was positive. Patient with elevated BNP of 916.9. Patient with some clinical improvement after IV Lasix. Fasting lipid panel with LDL of 111. Cardiac enzymes negative 3. 2-D echo pending. Continue Lasix 40 mg IV daily. Continue aspirin, lisinopril, Toprol-XL. Continue azithromycin, nebulizers, Mucinex, IV steroid taper, IV Rocephin. Follow.  #2 community acquired pneumonia Patient presented with cough, shortness of breath. Chest x-ray consistent with volume overload. Patient with urine Streptococcus pneumonia positive. Urine Legionella antigen pending. Check a sputum Gram stain and culture. Continue azithromycin and IV Rocephin. Follow.  #3 acute on chronic systolic and diastolic CHF exacerbation Patient presenting with shortness of breath, signs of volume overload on examination, elevated BNP of 916.9 on admission. BNP elevated at 1015.2. Patient with some improvement with cramping. I/O = -4.0 L. Cycle cardiac enzymes negative 3. 2-D echo pending. Continue daily IV Lasix. Continue aspirin, lisinopril, Toprol. Strict I's and O's. Daily weights. Follow.  #4 probable COPD exacerbation Patient with a cough. Urine strep pneumococcus antigen is positive. Some clinical improvement. Continue IV Rocephin and azithromycin, Mucinex, scheduled nebulizers. Change IV steroids to oral steroids tomorrow.  5 hypertension Continue lisinopril, IV Lasix. Increase Toprol-XL to 75 mg daily. Follow.  #6 gout Stable. Uric acid is elevated. Continue colchicine.  #7 hyperlipidemia LDL was 111. Will hold  off on a statin for now secondary to patient's cramping. Outpatient follow-up.  #8 gastroesophageal reflux disease PPI.  #9 tobacco abuse  declined tobacco cessation. Nicotine patch.  #10 right flank pain Likely musculoskeletal in nature. CT stone protocol was negative for kidney stone. Continue scheduled ibuprofen.  #11 prophylaxis  PPI for GI prophylaxis heparin for DVT prophylaxis.  Code Status: Full Family Communication: Updated patient and family at bedside. Disposition Plan: Home when shortness of breath has improved and CHF exacerbation is compensated and pneumonia treated and patient on oral antibiotics. Hopefully 1-2 days.   Consultants:  None  Procedures:  CTI to chest 10/04/2015  CT renal stone protocol 10/03/2015 Chest x-ray 10/03/2015 2-D echo 10/05/2015  Antibiotics:  Oral azithromycin 10/05/2015  IV Rocephin 10/04/2015    HPI/Subjective: Patient states shortness of breath improved. Patient states cramping improving. Cough improving. Patient feeling better. Patient asking when she can go home.   Objective: Filed Vitals:   10/05/15 1139 10/05/15 1208  BP: 162/80 145/58  Pulse:    Temp:    Resp:      Intake/Output Summary (Last 24 hours) at 10/05/15 1440 Last data filed at 10/05/15 1138  Gross per 24 hour  Intake   1080 ml  Output   2850 ml  Net  -1770 ml   Filed Weights   10/03/15 2005 10/04/15 0117 10/05/15 0518  Weight: 84.823 kg (187 lb) 83.1 kg (183 lb 3.2 oz) 83 kg (182 lb 15.7 oz)    Exam:   General:  NAD  Cardiovascular: RRR. + JVD  Respiratory: CTAB  Abdomen: Soft/NT/ND/+BS  Musculoskeletal:No c/c/e  Data Reviewed: Basic Metabolic Panel:  Recent Labs Lab 09/29/15 1541 10/03/15 2045 10/04/15 0500 10/04/15 1110 10/05/15 0512  NA 140 139  --  136 137  K 3.5 3.7  --  3.5 4.8  CL 103 104  --  101 104  CO2 30 25  --  22 25  GLUCOSE 102* 111*  --  158* 183*  BUN 10 14  --  13 17  CREATININE 0.68 0.68  --  0.74  0.73  CALCIUM 8.8 9.0  --  9.3 9.3  MG  --   --  2.1  --   --    Liver Function Tests:  Recent Labs Lab 10/03/15 2045  AST 23  ALT 26  ALKPHOS 102  BILITOT 1.0  PROT 7.8  ALBUMIN 3.9    Recent Labs Lab 10/04/15 0141  LIPASE 21   No results for input(s): AMMONIA in the last 168 hours. CBC:  Recent Labs Lab 10/03/15 2045 10/04/15 1110 10/05/15 0512  WBC 6.9 7.4 9.7  HGB 12.1 12.8 11.8*  HCT 38.4 40.1 37.4  MCV 91.2 89.5 90.3  PLT 258 215 271   Cardiac Enzymes:  Recent Labs Lab 10/04/15 0141 10/04/15 0800 10/04/15 1110  TROPONINI 0.04* 0.03 <0.03   BNP (last 3 results)  Recent Labs  10/03/15 2052 10/05/15 0512  BNP 916.9* 1015.2*    ProBNP (last 3 results) No results for input(s): PROBNP in the last 8760 hours.  CBG: No results for input(s): GLUCAP in the last 168 hours.  Recent Results (from the past 240 hour(s))  Culture, expectorated sputum-assessment     Status: None   Collection Time: 10/03/15  8:49 PM  Result Value Ref Range Status   Specimen Description SPUTUM  Final   Special Requests Normal  Final   Sputum evaluation THIS SPECIMEN IS ACCEPTABLE FOR SPUTUM CULTURE  Final   Report Status 10/03/2015 FINAL  Final  Culture, respiratory (NON-Expectorated)     Status: None (Preliminary result)   Collection Time: 10/03/15  8:49 PM  Result Value Ref Range Status   Specimen Description SPU  Final   Special Requests NONE  Final   Gram Stain   Final    THIS SPECIMEN IS ACCEPTABLE FOR SPUTUM CULTURE ABUNDANT WBC PRESENT, PREDOMINANTLY PMN FEW SQUAMOUS EPITHELIAL CELLS PRESENT MODERATE GRAM NEGATIVE RODS MODERATE GRAM POSITIVE COCCI IN PAIRS    Culture   Final    Culture reincubated for better growth Performed at [facility]    Report Status PENDING  Incomplete  Culture, blood (routine x 2) Call MD if unable to obtain prior to antibiotics being given     Status: None (Preliminary result)   Collection Time: 10/04/15  1:30 AM   Result Value Ref Range Status   Specimen Description BLOOD LEFT ARM  Final   Special Requests BOTTLES DRAWN AEROBIC ONLY 5CC  Final   Culture   Final    NO GROWTH 1 DAY Performed at [facility]    Report Status PENDING  Incomplete  Culture, blood (routine x 2) Call MD if unable to obtain prior to antibiotics being given     Status: None (Preliminary result)   Collection Time: 10/04/15  1:41 AM  Result Value Ref Range Status   Specimen Description BLOOD LEFT HAND  Final   Special Requests IN PEDIATRIC BOTTLE 2CC  Final   Culture   Final    NO GROWTH 1 DAY Performed at [facility]    Report Status PENDING  Incomplete  Culture, respiratory (NON-Expectorated)     Status: None (Preliminary result)   Collection Time: 10/04/15  3:00 PM  Result Value Ref Range Status   Specimen Description TRACHEAL ASPIRATE  Final   Special Requests NONE  Final   Gram  Stain   Final    THIS SPECIMEN IS ACCEPTABLE FOR SPUTUM CULTURE ABUNDANT WBC PRESENT, PREDOMINANTLY PMN FEW SQUAMOUS EPITHELIAL CELLS PRESENT ABUNDANT GRAM NEGATIVE COCCOBACILLI FEW GRAM POSITIVE COCCI IN PAIRS    Culture   Final    Culture reincubated for better growth Performed at [facility]    Report Status PENDING  Incomplete     Studies: Dg Chest 2 View  10/03/2015  CLINICAL DATA:  A 69 year old female with cough and shortness of breath EXAM: CHEST  2 VIEW COMPARISON:  Chest radiograph dated 09/20/15 FINDINGS: Two views of the chest do not demonstrate any focal consolidation, pleural effusion, or pneumothorax. There is stable mild interstitial prominence appearance of the lungs. Stable cardiac silhouette. There are degenerative changes of the glenohumeral joint with elevation of the humeral head compatible with chronic rotator cuff injury. IMPRESSION: No active cardiopulmonary disease. Electronically Signed   By: Anner Crete M.D.   On: 10/03/2015 20:43   Ct Angio Chest Pe W/cm &/or Wo  Cm  10/04/2015  CLINICAL DATA:  Shortness of breath.  Elevated D-dimer. EXAM: CT ANGIOGRAPHY CHEST WITH CONTRAST TECHNIQUE: Multidetector CT imaging of the chest was performed using the standard protocol during bolus administration of intravenous contrast. Multiplanar CT image reconstructions and MIPs were obtained to evaluate the vascular anatomy. CONTRAST:  176mL OMNIPAQUE IOHEXOL 350 MG/ML SOLN COMPARISON:  Chest radiographs obtained yesterday. FINDINGS: Mediastinum/Lymph Nodes: Normally opacified pulmonary arteries with no pulmonary arterial filling defects seen. Mildly prominent mediastinal and bilateral hilar lymph nodes. These include a precarinal node with a short axis diameter of 8 mm on image number 32, a subcarinal node with a short axis diameter of 13 mm on image number 40, a right hilar node with a short axis diameter of 8 mm on image number 46 and a left hilar node with a short axis diameter of 8 mm on image number 44. Lungs/Pleura: Areas of cylindrical bronchiectasis with associated mild parenchymal densities in both lower lobes. Lesser degree of similar changes in the upper lobes, greater on the right. Diffuse peribronchial thickening. Mild bullous changes. No pleural fluid. No lung nodules. Upper abdomen: Partially included right renal cyst. Two included colonic diverticula. Musculoskeletal: Extensive thoracic and lower cervical spine degenerative changes. Review of the MIP images confirms the above findings. IMPRESSION: 1. No pulmonary emboli or acute abnormality. 2. Changes of cylindrical bronchiectasis in both lower lobes and, to a lesser degree, in both upper lobes. 3. Minimal mediastinal and bilateral hilar adenopathy, most likely reactive. 4. Moderate bronchitic changes and mild changes of COPD. Electronically Signed   By: Claudie Revering M.D.   On: 10/04/2015 14:59   Ct Renal Stone Study  10/03/2015  CLINICAL DATA:  69 year old female with right flank pain radiating to the groin. EXAM: CT  ABDOMEN AND PELVIS WITHOUT CONTRAST TECHNIQUE: Multidetector CT imaging of the abdomen and pelvis was performed following the standard protocol without IV contrast. COMPARISON:  CT dated 07/27/2007 FINDINGS: Evaluation of this exam is limited in the absence of intravenous contrast. Evaluation of the pelvic structures is limited due to streak artifact caused by right hip arthroplasty. There are bibasilar interstitial prominence in vascular crowding likely chronic in nature. Developing pneumonia is less likely but not excluded. Clinical correlation is recommended. No intra-abdominal free air or free fluid identified. The liver, gallbladder, spleen, and adrenal glands appear unremarkable. The pancreas is atrophic. There is no hydronephrosis or nephrolithiasis on either side. An ill-defined 1.5 cm right renal hypodense lesion appears stable  compared to the prior study. This lesion is not characterized on this noncontrast study but likely represents a cyst. The visualized ureters and urinary bladder appear unremarkable. Hysterectomy. There is sigmoid diverticulosis with scattered colonic diverticula without active inflammation. No evidence of bowel obstruction. Normal appendix. Aortoiliac atherosclerotic disease. No portal venous gas identified. There is no adenopathy. Midline vertical anterior pelvic wall incisional scar. Small fat containing umbilical hernia. There is osteopenia with severe degenerative changes of the spine. There is sclerotic changes and disc space narrowing at L3-L4. Grade 1 L4-5 anterolisthesis. No acute fracture. There is stable fusion deformity of the left hip joint. Right hip arthroplasty with extensive resorption of right femur noted. These findings are similar to prior study. IMPRESSION: No hydronephrosis or nephrolithiasis. Diverticulosis.  No evidence of bowel obstruction or inflammation. Electronically Signed   By: Anner Crete M.D.   On: 10/03/2015 22:50    Scheduled Meds: . aspirin   81 mg Oral Daily  . azithromycin  250 mg Oral Daily  . cefTRIAXone (ROCEPHIN)  IV  2 g Intravenous Daily  . colchicine  0.6 mg Oral BID  . dextromethorphan-guaiFENesin  1 tablet Oral BID  . furosemide  20 mg Intravenous Daily  . heparin  5,000 Units Subcutaneous 3 times per day  . ibuprofen  600 mg Oral TID  . ipratropium-albuterol  3 mL Nebulization TID  . lisinopril  40 mg Oral Daily  . methylPREDNISolone (SOLU-MEDROL) injection  60 mg Intravenous Q12H  . metoprolol succinate  50 mg Oral Daily  . nicotine  21 mg Transdermal Daily  . pantoprazole  40 mg Oral Daily  . potassium chloride  40 mEq Oral Daily  . sodium chloride  3 mL Intravenous Q12H  . triamcinolone ointment  1 application Topical BID   Continuous Infusions:   Principal Problem:   Acute respiratory failure (HCC) Active Problems:   Acute on chronic systolic CHF (congestive heart failure) (HCC)   CAP (community acquired pneumonia)   Hypertension   DJD (degenerative joint disease)   Gout   GERD (gastroesophageal reflux disease)   Hyperlipidemia   Cramps, muscle, general   Tobacco abuse   Dyspnea   Flank pain, right   Cough   COPD exacerbation ([place])    Time spent: 75 mins    [facility] MD [facility] Pager [phone]. If 7PM-7AM, please contact night-coverage at [email or web address], password [facility] 10/05/2015, 2:40 PM  LOS: 2 days

## 2015-10-05 NOTE — Progress Notes (Signed)
Echocardiogram 2D Echocardiogram has been performed.  Donna Herrera 10/05/2015, 2:40 PM

## 2015-10-06 DIAGNOSIS — K219 Gastro-esophageal reflux disease without esophagitis: Secondary | ICD-10-CM

## 2015-10-06 DIAGNOSIS — I509 Heart failure, unspecified: Secondary | ICD-10-CM

## 2015-10-06 DIAGNOSIS — R0602 Shortness of breath: Secondary | ICD-10-CM

## 2015-10-06 DIAGNOSIS — E785 Hyperlipidemia, unspecified: Secondary | ICD-10-CM

## 2015-10-06 LAB — BASIC METABOLIC PANEL
Anion gap: 9 (ref 5–15)
BUN: 23 mg/dL — AB (ref 6–20)
CHLORIDE: 103 mmol/L (ref 101–111)
CO2: 25 mmol/L (ref 22–32)
CREATININE: 0.77 mg/dL (ref 0.44–1.00)
Calcium: 9 mg/dL (ref 8.9–10.3)
GFR calc Af Amer: >60 mL/min (ref >60)
GFR calc non Af Amer: >60 mL/min (ref >60)
Glucose, Bld: 112 mg/dL — ABNORMAL HIGH (ref 65–99)
Potassium: 4.1 mmol/L (ref 3.5–5.1)
SODIUM: 137 mmol/L (ref 135–145)

## 2015-10-06 LAB — CBC
HCT: 37.2 % (ref 36.0–46.0)
Hemoglobin: 11.6 g/dL — ABNORMAL LOW (ref 12.0–15.0)
MCH: 28.2 pg (ref 26.0–34.0)
MCHC: 31.2 g/dL (ref 30.0–36.0)
MCV: 90.3 fL (ref 78.0–100.0)
PLATELETS: 291 10*3/uL (ref 150–400)
RBC: 4.12 MIL/uL (ref 3.87–5.11)
RDW: 14.4 % (ref 11.5–15.5)
WBC: 10.2 10*3/uL (ref 4.0–10.5)

## 2015-10-06 LAB — CULTURE, RESPIRATORY W GRAM STAIN: Culture: NORMAL

## 2015-10-06 LAB — CULTURE, RESPIRATORY: CULTURE: NORMAL

## 2015-10-06 MED ORDER — METOPROLOL SUCCINATE ER 25 MG PO TB24: 75.0000 mg | ORAL_TABLET | Freq: Every day | ORAL | Status: DC

## 2015-10-06 MED ORDER — FUROSEMIDE 20 MG PO TABS
20.0000 mg | ORAL_TABLET | Freq: Every day | ORAL | Status: DC
  Administered 2015-10-06: 20 mg via ORAL
  Filled 2015-10-06: qty 1

## 2015-10-06 MED ORDER — PREDNISONE 20 MG PO TABS: 20.0000 mg | ORAL_TABLET | Freq: Every day | ORAL | Status: DC

## 2015-10-06 MED ORDER — IBUPROFEN 600 MG PO TABS: 600.0000 mg | ORAL_TABLET | Freq: Three times a day (TID) | ORAL | Status: DC

## 2015-10-06 MED ORDER — COLCHICINE 0.6 MG PO TABS: 0.6000 mg | ORAL_TABLET | Freq: Two times a day (BID) | ORAL | Status: DC

## 2015-10-06 MED ORDER — AMOXICILLIN-POT CLAVULANATE 875-125 MG PO TABS: 1.0000 | ORAL_TABLET | Freq: Two times a day (BID) | ORAL | Status: DC

## 2015-10-06 MED ORDER — DM-GUAIFENESIN ER 30-600 MG PO TB12: 2.0000 | ORAL_TABLET | Freq: Two times a day (BID) | ORAL | Status: DC

## 2015-10-06 MED ORDER — TIZANIDINE HCL 2 MG PO TABS: 2.0000 mg | ORAL_TABLET | Freq: Four times a day (QID) | ORAL | Status: DC | PRN

## 2015-10-06 MED ORDER — POTASSIUM CHLORIDE CRYS ER 20 MEQ PO TBCR: 20.0000 meq | EXTENDED_RELEASE_TABLET | Freq: Every day | ORAL | Status: DC

## 2015-10-06 NOTE — Progress Notes (Signed)
PT Cancellation Note  Patient Details Name: Donna Herrera MRN: [id number] DOB: Oct 02, 1946   Cancelled Treatment:    Reason Eval/Treat Not Completed: PT screened, no needs identified, will sign off (pt reports (and nurse tech confirmed) she's been up walking in the hall this morning with her crutches and is independent with mobility, no acute PT indicated. She has assist at home for ADLs from her daughter and grand daughter.  )   Philomena Doheny 10/06/2015, 10:23 AM [phone]

## 2015-10-06 NOTE — Care Management Important Message (Signed)
Important Message  Patient Details  Name: LATARA SHELDEN MRN: [id number] Date of Birth: 1946-10-20   Medicare Important Message Given:  Yes    Camillo Flaming 10/06/2015, 12:07 [place] Message  Patient Details  Name: MASHEKA VANDOORN MRN: [id number] Date of Birth: 1946/06/22   Medicare Important Message Given:  Yes    Camillo Flaming 10/06/2015, 12:07 PM

## 2015-10-06 NOTE — Discharge Summary (Signed)
Physician Discharge Summary  Donna Herrera [id number] DOB: 1946/09/09 DOA: 10/03/2015  PCP: Georges Lynch, MD  Admit date: 10/03/2015 Discharge date: 10/06/2015  Time spent: 65 minutes  Recommendations for Outpatient Follow-up:  1. Follow-up with Georges Lynch, MD in 1 week. On follow-up a basic metabolic profile will need to be obtained to follow-up patient's electrolytes and renal function. Patient's LFTs also need to be assessed at that time. Patient blood pressure also need to be assessed at that time. Patient was started on colchicine for gout prophylaxis.   Discharge Diagnoses:  Principal Problem:   Acute respiratory failure ([place]) Active Problems:   Acute on chronic systolic CHF (congestive heart failure) ([place])   CAP (community acquired pneumonia)   Hypertension   DJD (degenerative joint disease)   Gout   GERD (gastroesophageal reflux disease)   Hyperlipidemia   Cramps, muscle, general   Tobacco abuse   Dyspnea   Flank pain, right   Cough   COPD exacerbation (HCC)   Acute on chronic congestive heart failure (HCC)   SOB (shortness of breath)   Discharge Condition: Stable and improved  Diet recommendation: Heart healthy  Filed Weights   10/04/15 0117 10/05/15 0518 10/06/15 0544  Weight: 83.1 kg (183 lb 3.2 oz) 83 kg (182 lb 15.7 oz) 82.6 kg (182 lb 1.6 oz)    History of present illness:  Per Dr Vladimir Faster is a 69 y.o. female with PMH of tobacco abuse, systolic congestive heart failure with a year for 40%, hyperlipidemia, GERD, gout, arthritis, allergy, who presented with cough, shortness of breath and a right flank pain.  Patient reported that she had been having cough and shortness of breath in the past 2 days, which has been progressively getting worse. She coughed up streaks of blood and brownish colored sputum. She had intermittent chest pain which was pleuritic, it is aggravated by deep breath and coughing. She did not have tenderness over calf areas.  She also has bilateral leg edema. Patient reported that she had right flank pain which had been going on for about 2 weeks. It was constant, 5 out of 10 in severity, radiating to peroneal area. It was worse in the night. Patient does not have dysuria, burning on urination or increased frequency. She had some nausea, but no vomiting, abdominal pain. She reported that she was treated for UTI 2 weeks ago with Keflex. She had diarrhea when she was taking Keflex, which has resolved. Currently the patient does not have diarrhea. Patient does not have fever, chills, unilateral weakness.  In ED, patient was found to have negative urinalysis, BNP 916.9, negative troponin, WBC 6.9, temperature normal, no tachycardia, electrolytes and renal function okay. X-ray has no infiltration. CT scan per renal study protocol is negative for kidney stone or hydronephrosis, but showed diverticulosis without signs of diverticulitis.   Hospital Course:  #1 acute respiratory failure Likely multifactorial secondary to acute CHF exacerbation, community-acquired pneumonia, bronchitis/COPD. patient with a history of tobacco abuse presenting with a productive cough and diffuse crackles noted on examination. Urine streptococcal pneumonia was positive. Patient with elevated BNP of 916.9. CT angiogram of the chest which was done was negative for pulmonary emboli. Patient was admitted placed empirically on antibiotics and subsequently started on IV Lasix. Patient diuresed well and improved clinically throughout the hospitalization. Fasting lipid panel with LDL of 111. Cardiac enzymes negative 3. 2-D echo with a EF of 35-40% with diffuse hypokinesis and grade 2 diastolic dysfunction unchanged from her 2-D echo.  Patient diuresed well on IV Lasix and was -6.391 L during this hospitalization. Patient was subsequently transitioned to home dose of oral Lasix. Patient was maintained on aspirin, lisinopril, Toprol-XL. Patient had also been placed on  IV Rocephin and azithromycin will subsequently transitioned to oral Augmentin for 5 more days to complete a one-week because of antibiotic therapy. Patient be discharged home in stable and improved condition.  #2 community acquired pneumonia Patient presented with cough, shortness of breath. Chest x-ray consistent with volume overload. Patient with urine Streptococcus pneumonia positive. Urine Legionella antigen pending with no growth to date. Sputum Gram stain and culture with no growth to date. Patient was initially placed on IV Rocephin and azithromycin. Patient was also placed on Mucinex and nebulizer treatments. Patient improved clinically and patient will be discharged home on Augmentin for 5 more days to complete a one-week course of antibiotic therapy.  #3 acute on chronic systolic and diastolic CHF exacerbation Patient presenting with shortness of breath, signs of volume overload on examination, elevated BNP of 916.9 on admission. BNP elevated at 1015.2. Patient was placed on IV Lasix, continued on home dose lisinopril and Toprol-XL. Cardiac enzymes were cycled which were negative 3. 2-D echo was obtained which had a EF of 35-40% with diffuse hypokinesis and grade 2 diastolic dysfunction unchanged from prior 2-D echo. Patient improved clinically and was -6.391 L during the hospitalization. Patient be discharged home in stable and improved condition on lisinopril, aspirin, Toprol, oral Lasix. Patient is to follow-up with PCP as outpatient.  #4 probable COPD exacerbation Patient with a cough. Urine strep pneumococcus antigen is positive. Patient was placed initially on oral azithromycin and IV Rocephin was added to her regimen. Patient was also placed on Mucinex with scheduled nebulizer treatments as well as IV steroids. IV steroids was tapered down to oral steroids and patient be discharged home on a steroid taper. Patient improved clinically did not have any further wheezing be discharged home in  stable and improved condition. Tobacco cessation was stressed to patient.   5 hypertension Patient was maintained on a home regimen of lisinopril. Patient was initially placed on IV Lasix and subsequently transitioned to a home dose of Lasix 20 mg daily on day of discharge. Patient was started on a home dose Toprol-XL 50 mg daily and dose adjusted to 75 mg daily for better blood pressure control. Outpatient follow-up.   #6 gout Stable. Uric acid is elevated. Patient started on colchicine.  #7 hyperlipidemia LDL was 111. Will hold off on a statin for now secondary to patient's cramping. Outpatient follow-up.  #8 gastroesophageal reflux disease Patient was maintained on a PPI.  #9 tobacco abuse  declined tobacco cessation. Nicotine patch placed.  #10 right flank pain Likely musculoskeletal in nature. CT stone protocol was negative for kidney stone. Patient was placed on scheduled ibuprofen, with clinical improvement.   Procedures:  CT angio to chest 10/04/2015  CT renal stone protocol 10/03/2015 Chest x-ray 10/03/2015 2-D echo 10/05/2015   Consultations:  None  Discharge Exam: Filed Vitals:   10/06/15 0916 10/06/15 0922  BP: 144/70   Pulse:  70  Temp:    Resp:      General: NAD Cardiovascular: RRR Respiratory: CTAB  Discharge Instructions   Discharge Instructions    Diet - low sodium heart healthy    Complete by:  As directed      Discharge instructions    Complete by:  As directed   Follow up with Georges Lynch, MD in 1  week.     Increase activity slowly    Complete by:  As directed           Current Discharge Medication List    START taking these medications   Details  amoxicillin-clavulanate (AUGMENTIN) 875-125 MG tablet Take 1 tablet by mouth 2 (two) times daily. Take for 5 days then stop. Qty: 10 tablet, Refills: 0    colchicine 0.6 MG tablet Take 1 tablet (0.6 mg total) by mouth 2 (two) times daily. Qty: 62 tablet, Refills: 0     dextromethorphan-guaiFENesin (MUCINEX DM) 30-600 MG 12hr tablet Take 2 tablets by mouth 2 (two) times daily. Take for 5 days then stop. Qty: 20 tablet, Refills: 0    ibuprofen (ADVIL,MOTRIN) 600 MG tablet Take 1 tablet (600 mg total) by mouth 3 (three) times daily. Take for 4 days, then use as needed. Qty: 30 tablet, Refills: 0    potassium chloride SA (K-DUR,KLOR-CON) 20 MEQ tablet Take 1 tablet (20 mEq total) by mouth daily. Qty: 30 tablet, Refills: 0    tiZANidine (ZANAFLEX) 2 MG tablet Take 1 tablet (2 mg total) by mouth every 6 (six) hours as needed for muscle spasms. Qty: 20 tablet, Refills: 1      CONTINUE these medications which have CHANGED   Details  metoprolol succinate (TOPROL-XL) 25 MG 24 hr tablet Take 3 tablets (75 mg total) by mouth daily. Take with or immediately following a meal. Qty: 90 tablet, Refills: 1    predniSONE (DELTASONE) 20 MG tablet Take 1-3 tablets (20-60 mg total) by mouth daily before breakfast. Take 3 tablets (60mg ) daily x 2 days, then 2 tablets (40mg ) daily x 3 days, then 1 tablet (20mg ) daily x 3 days then stop Qty: 15 tablet, Refills: 0      CONTINUE these medications which have NOT CHANGED   Details  albuterol (PROVENTIL HFA;VENTOLIN HFA) 108 (90 BASE) MCG/ACT inhaler Inhale 2 puffs into the lungs every 4 (four) hours as needed for wheezing or shortness of breath. Qty: 1 Inhaler, Refills: 0    aspirin 81 MG tablet Take 1 tablet (81 mg total) by mouth daily. Qty: 90 tablet, Refills: 3    furosemide (LASIX) 20 MG tablet TAKE 1 TABLET (20 MG TOTAL) BY MOUTH DAILY. Qty: 30 tablet, Refills: 1    HYDROcodone-acetaminophen (NORCO) 5-325 MG tablet Take 1 tablet by mouth 2 (two) times daily as needed. Do not fill until 09/25/15 Qty: 60 tablet, Refills: 0   Associated Diagnoses: Left shoulder pain    lisinopril (PRINIVIL,ZESTRIL) 20 MG tablet Take 2 tablets (40 mg total) by mouth daily. Qty: 60 tablet, Refills: 5    meloxicam (MOBIC) 15 MG tablet  Take 1 tablet (15 mg total) by mouth daily. Qty: 30 tablet, Refills: 0    pantoprazole (PROTONIX) 20 MG tablet TAKE 2 TABLETS (40 MG TOTAL) BY MOUTH DAILY. Qty: 180 tablet, Refills: 3    triamcinolone ointment (KENALOG) 0.5 % Apply 1 application topically 2 (two) times daily. To palms Qty: 30 g, Refills: 3    cetirizine (ZYRTEC) 10 MG tablet Take 1 tablet (10 mg total) by mouth daily. Qty: 30 tablet, Refills: 0    Cholecalciferol 50000 UNITS TABS Take 1 tablet by mouth once a week. For 8 weeks Qty: 8 tablet, Refills: 0    D3-50 50000 UNITS capsule TAKE 1 TABLET BY MOUTH ONCE A WEEK. FOR 8 WEEKS Qty: 8 capsule, Refills: 0      STOP taking these medications  cephALEXin (KEFLEX) 500 MG capsule      Colchicine 0.6 MG CAPS      sucralfate (CARAFATE) 1 G tablet      terbinafine (LAMISIL) 1 % cream        Allergies  Allergen Reactions  . Morphine And Related Itching and Swelling   Follow-up Information    Follow up with Georges Lynch, MD. Schedule an appointment as soon as possible for a visit in 1 week.   Specialty:  Family Medicine   Contact information:   [id number] [place] [address] [phone]        The results of significant diagnostics from this hospitalization (including imaging, microbiology, ancillary and laboratory) are listed below for reference.    Significant Diagnostic Studies: Dg Chest 2 View  10/03/2015  CLINICAL DATA:  A 69 year old female with cough and shortness of breath EXAM: CHEST  2 VIEW COMPARISON:  Chest radiograph dated 09/20/15 FINDINGS: Two views of the chest do not demonstrate any focal consolidation, pleural effusion, or pneumothorax. There is stable mild interstitial prominence appearance of the lungs. Stable cardiac silhouette. There are degenerative changes of the glenohumeral joint with elevation of the humeral head compatible with chronic rotator cuff injury. IMPRESSION: No active cardiopulmonary disease. Electronically  Signed   By: Anner Crete M.D.   On: 10/03/2015 20:43   Dg Chest 2 View  09/20/2015  CLINICAL DATA:  Epigastric pain for 2 days.  Initial encounter. EXAM: CHEST  2 VIEW COMPARISON:  11/06/2013 radiographs. FINDINGS: The heart size and mediastinal contours are stable. There is chronic elevation of the right hemidiaphragm and chronic interstitial prominence. No edema, confluent airspace opacity or significant pleural effusion identified. There are advanced glenohumeral degenerative changes bilaterally with obliteration of the subacromial space of both shoulders consistent with chronic rotator cuff tears. IMPRESSION: Stable chest.  No acute cardiopulmonary process. Electronically Signed   By: Richardean Sale M.D.   On: 09/20/2015 17:00   Ct Angio Chest Pe W/cm &/or Wo Cm  10/04/2015  CLINICAL DATA:  Shortness of breath.  Elevated D-dimer. EXAM: CT ANGIOGRAPHY CHEST WITH CONTRAST TECHNIQUE: Multidetector CT imaging of the chest was performed using the standard protocol during bolus administration of intravenous contrast. Multiplanar CT image reconstructions and MIPs were obtained to evaluate the vascular anatomy. CONTRAST:  154mL OMNIPAQUE IOHEXOL 350 MG/ML SOLN COMPARISON:  Chest radiographs obtained yesterday. FINDINGS: Mediastinum/Lymph Nodes: Normally opacified pulmonary arteries with no pulmonary arterial filling defects seen. Mildly prominent mediastinal and bilateral hilar lymph nodes. These include a precarinal node with a short axis diameter of 8 mm on image number 32, a subcarinal node with a short axis diameter of 13 mm on image number 40, a right hilar node with a short axis diameter of 8 mm on image number 46 and a left hilar node with a short axis diameter of 8 mm on image number 44. Lungs/Pleura: Areas of cylindrical bronchiectasis with associated mild parenchymal densities in both lower lobes. Lesser degree of similar changes in the upper lobes, greater on the right. Diffuse peribronchial  thickening. Mild bullous changes. No pleural fluid. No lung nodules. Upper abdomen: Partially included right renal cyst. Two included colonic diverticula. Musculoskeletal: Extensive thoracic and lower cervical spine degenerative changes. Review of the MIP images confirms the above findings. IMPRESSION: 1. No pulmonary emboli or acute abnormality. 2. Changes of cylindrical bronchiectasis in both lower lobes and, to a lesser degree, in both upper lobes. 3. Minimal mediastinal and bilateral hilar adenopathy, most likely reactive.  4. Moderate bronchitic changes and mild changes of COPD. Electronically Signed   By: Claudie Revering M.D.   On: 10/04/2015 14:59   Ct Renal Stone Study  10/03/2015  CLINICAL DATA:  69 year old female with right flank pain radiating to the groin. EXAM: CT ABDOMEN AND PELVIS WITHOUT CONTRAST TECHNIQUE: Multidetector CT imaging of the abdomen and pelvis was performed following the standard protocol without IV contrast. COMPARISON:  CT dated 07/27/2007 FINDINGS: Evaluation of this exam is limited in the absence of intravenous contrast. Evaluation of the pelvic structures is limited due to streak artifact caused by right hip arthroplasty. There are bibasilar interstitial prominence in vascular crowding likely chronic in nature. Developing pneumonia is less likely but not excluded. Clinical correlation is recommended. No intra-abdominal free air or free fluid identified. The liver, gallbladder, spleen, and adrenal glands appear unremarkable. The pancreas is atrophic. There is no hydronephrosis or nephrolithiasis on either side. An ill-defined 1.5 cm right renal hypodense lesion appears stable compared to the prior study. This lesion is not characterized on this noncontrast study but likely represents a cyst. The visualized ureters and urinary bladder appear unremarkable. Hysterectomy. There is sigmoid diverticulosis with scattered colonic diverticula without active inflammation. No evidence of bowel  obstruction. Normal appendix. Aortoiliac atherosclerotic disease. No portal venous gas identified. There is no adenopathy. Midline vertical anterior pelvic wall incisional scar. Small fat containing umbilical hernia. There is osteopenia with severe degenerative changes of the spine. There is sclerotic changes and disc space narrowing at L3-L4. Grade 1 L4-5 anterolisthesis. No acute fracture. There is stable fusion deformity of the left hip joint. Right hip arthroplasty with extensive resorption of right femur noted. These findings are similar to prior study. IMPRESSION: No hydronephrosis or nephrolithiasis. Diverticulosis.  No evidence of bowel obstruction or inflammation. Electronically Signed   By: Anner Crete M.D.   On: 10/03/2015 22:50    Microbiology: Recent Results (from the past 240 hour(s))  Culture, expectorated sputum-assessment     Status: None   Collection Time: 10/03/15  8:49 PM  Result Value Ref Range Status   Specimen Description SPUTUM  Final   Special Requests Normal  Final   Sputum evaluation THIS SPECIMEN IS ACCEPTABLE FOR SPUTUM CULTURE  Final   Report Status 10/03/2015 FINAL  Final  Culture, respiratory (NON-Expectorated)     Status: None   Collection Time: 10/03/15  8:49 PM  Result Value Ref Range Status   Specimen Description SPU  Final   Special Requests NONE  Final   Gram Stain   Final    THIS SPECIMEN IS ACCEPTABLE FOR SPUTUM CULTURE ABUNDANT WBC PRESENT, PREDOMINANTLY PMN FEW SQUAMOUS EPITHELIAL CELLS PRESENT MODERATE GRAM NEGATIVE RODS MODERATE GRAM POSITIVE COCCI IN PAIRS    Culture   Final    NORMAL OROPHARYNGEAL FLORA Performed at [facility]    Report Status 10/06/2015 FINAL  Final  Culture, blood (routine x 2) Call MD if unable to obtain prior to antibiotics being given     Status: None (Preliminary result)   Collection Time: 10/04/15  1:30 AM  Result Value Ref Range Status   Specimen Description BLOOD LEFT ARM  Final   Special Requests  BOTTLES DRAWN AEROBIC ONLY 5CC  Final   Culture   Final    NO GROWTH 1 DAY Performed at [facility]    Report Status PENDING  Incomplete  Culture, blood (routine x 2) Call MD if unable to obtain prior to antibiotics being given  Status: None (Preliminary result)   Collection Time: 10/04/15  1:41 AM  Result Value Ref Range Status   Specimen Description BLOOD LEFT HAND  Final   Special Requests IN PEDIATRIC BOTTLE 2CC  Final   Culture   Final    NO GROWTH 1 DAY Performed at [facility]    Report Status PENDING  Incomplete  Culture, respiratory (NON-Expectorated)     Status: None   Collection Time: 10/04/15  3:00 PM  Result Value Ref Range Status   Specimen Description TRACHEAL ASPIRATE  Final   Special Requests NONE  Final   Gram Stain   Final    THIS SPECIMEN IS ACCEPTABLE FOR SPUTUM CULTURE ABUNDANT WBC PRESENT, PREDOMINANTLY PMN FEW SQUAMOUS EPITHELIAL CELLS PRESENT ABUNDANT GRAM NEGATIVE COCCOBACILLI FEW GRAM POSITIVE COCCI IN PAIRS    Culture   Final    NORMAL OROPHARYNGEAL FLORA Performed at [facility]    Report Status 10/06/2015 FINAL  Final     Labs: Basic Metabolic Panel:  Recent Labs Lab 09/29/15 1541 10/03/15 2045 10/04/15 0500 10/04/15 1110 10/05/15 0512 10/06/15 0530  NA 140 139  --  136 137 137  K 3.5 3.7  --  3.5 4.8 4.1  CL 103 104  --  101 104 103  CO2 30 25  --  22 25 25   GLUCOSE 102* 111*  --  158* 183* 112*  BUN 10 14  --  13 17 23*  CREATININE 0.68 0.68  --  0.74 0.73 0.77  CALCIUM 8.8 9.0  --  9.3 9.3 9.0  MG  --   --  2.1  --   --   --    Liver Function Tests:  Recent Labs Lab 10/03/15 2045  AST 23  ALT 26  ALKPHOS 102  BILITOT 1.0  PROT 7.8  ALBUMIN 3.9    Recent Labs Lab 10/04/15 0141  LIPASE 21   No results for input(s): AMMONIA in the last 168 hours. CBC:  Recent Labs Lab 10/03/15 2045 10/04/15 1110 10/05/15 0512 10/06/15 0530  WBC 6.9 7.4 9.7 10.2  HGB 12.1 12.8 11.8* 11.6*  HCT  38.4 40.1 37.4 37.2  MCV 91.2 89.5 90.3 90.3  PLT 258 215 271 291   Cardiac Enzymes:  Recent Labs Lab 10/04/15 0141 10/04/15 0800 10/04/15 1110  TROPONINI 0.04* 0.03 <0.03   BNP: BNP (last 3 results)  Recent Labs  10/03/15 2052 10/05/15 0512  BNP 916.9* 1015.2*    ProBNP (last 3 results) No results for input(s): PROBNP in the last 8760 hours.  CBG: No results for input(s): GLUCAP in the last 168 hours.     SignedIrine Seal MD [facility] 10/06/2015, 12:04 PM

## 2015-10-06 NOTE — Consult Note (Signed)
   [facility] CM Inpatient Consult   10/06/2015  Donna Herrera December 15, 1945 [id number]   [facility] referral received from inpatient RNCM. Cross-checked for eligibility.  Patient is not eligible for [facility] Care Management Services.   Reason:  Not a beneficiary currently attributed to one of the [place].  Membership roster used to verify non- eligible status. Made inpatient RNCM aware.  Marthenia Rolling, MSN-Ed, RN,BSN [facility] Liaison [phone]

## 2015-10-06 NOTE — Care Management Note (Signed)
Case Management Note  Patient Details  Name: ELANTRA CAPRARA MRN: [id number] Date of Birth: February 13, 1946  Subjective/Objective:             69 yo admitted with Acute respiratory failure       Action/Plan: From home with family  Expected Discharge Date:                  Expected Discharge Plan:  [place]  In-House Referral:     Discharge planning Services  CM Consult  Post Acute Care Choice:  Home Health Choice offered to:  Patient  DME Arranged:    DME Agency:     HH Arranged:  RN, Disease Management [place] Agency:  [place]  Status of Service:  Completed, signed off  Medicare Important Message Given:  Yes Date Medicare IM Given:    Medicare IM give by:    Date Additional Medicare IM Given:    Additional Medicare Important Message give by:     If discussed at [place] of Stay Meetings, dates discussed:    Additional Comments: CM consult for [facility] for Heart Failure. This CM met with pt and family at bedside to offer choice for [facility] services. Pt states she has used [facility] in the past and would like to use them again. Pt states she has all the equipment she needs at home. [facility] HH rep contacted for referral. No other DC needs communicated. Lynnell Catalan, RN 10/06/2015, 1:09 PM

## 2015-10-07 DIAGNOSIS — M109 Gout, unspecified: Secondary | ICD-10-CM | POA: Diagnosis not present

## 2015-10-07 DIAGNOSIS — I5021 Acute systolic (congestive) heart failure: Secondary | ICD-10-CM | POA: Diagnosis not present

## 2015-10-07 DIAGNOSIS — M1990 Unspecified osteoarthritis, unspecified site: Secondary | ICD-10-CM | POA: Diagnosis not present

## 2015-10-07 DIAGNOSIS — E785 Hyperlipidemia, unspecified: Secondary | ICD-10-CM | POA: Diagnosis not present

## 2015-10-07 DIAGNOSIS — K219 Gastro-esophageal reflux disease without esophagitis: Secondary | ICD-10-CM | POA: Diagnosis not present

## 2015-10-07 DIAGNOSIS — J441 Chronic obstructive pulmonary disease with (acute) exacerbation: Secondary | ICD-10-CM | POA: Diagnosis not present

## 2015-10-07 DIAGNOSIS — I10 Essential (primary) hypertension: Secondary | ICD-10-CM | POA: Diagnosis not present

## 2015-10-07 DIAGNOSIS — Z720 Tobacco use: Secondary | ICD-10-CM | POA: Diagnosis not present

## 2015-10-07 DIAGNOSIS — J189 Pneumonia, unspecified organism: Secondary | ICD-10-CM | POA: Diagnosis not present

## 2015-10-07 DIAGNOSIS — J440 Chronic obstructive pulmonary disease with acute lower respiratory infection: Secondary | ICD-10-CM | POA: Diagnosis not present

## 2015-10-09 DIAGNOSIS — K219 Gastro-esophageal reflux disease without esophagitis: Secondary | ICD-10-CM | POA: Diagnosis not present

## 2015-10-09 DIAGNOSIS — J441 Chronic obstructive pulmonary disease with (acute) exacerbation: Secondary | ICD-10-CM | POA: Diagnosis not present

## 2015-10-09 DIAGNOSIS — E785 Hyperlipidemia, unspecified: Secondary | ICD-10-CM | POA: Diagnosis not present

## 2015-10-09 DIAGNOSIS — Z720 Tobacco use: Secondary | ICD-10-CM | POA: Diagnosis not present

## 2015-10-09 DIAGNOSIS — M109 Gout, unspecified: Secondary | ICD-10-CM | POA: Diagnosis not present

## 2015-10-09 DIAGNOSIS — J440 Chronic obstructive pulmonary disease with acute lower respiratory infection: Secondary | ICD-10-CM | POA: Diagnosis not present

## 2015-10-09 DIAGNOSIS — I10 Essential (primary) hypertension: Secondary | ICD-10-CM | POA: Diagnosis not present

## 2015-10-09 DIAGNOSIS — I5021 Acute systolic (congestive) heart failure: Secondary | ICD-10-CM | POA: Diagnosis not present

## 2015-10-09 DIAGNOSIS — M1990 Unspecified osteoarthritis, unspecified site: Secondary | ICD-10-CM | POA: Diagnosis not present

## 2015-10-09 DIAGNOSIS — J189 Pneumonia, unspecified organism: Secondary | ICD-10-CM | POA: Diagnosis not present

## 2015-10-09 LAB — CULTURE, BLOOD (ROUTINE X 2)
Culture: NO GROWTH
Culture: NO GROWTH

## 2015-10-14 DIAGNOSIS — J440 Chronic obstructive pulmonary disease with acute lower respiratory infection: Secondary | ICD-10-CM | POA: Diagnosis not present

## 2015-10-14 DIAGNOSIS — J189 Pneumonia, unspecified organism: Secondary | ICD-10-CM | POA: Diagnosis not present

## 2015-10-14 DIAGNOSIS — K219 Gastro-esophageal reflux disease without esophagitis: Secondary | ICD-10-CM | POA: Diagnosis not present

## 2015-10-14 DIAGNOSIS — M109 Gout, unspecified: Secondary | ICD-10-CM | POA: Diagnosis not present

## 2015-10-14 DIAGNOSIS — I5021 Acute systolic (congestive) heart failure: Secondary | ICD-10-CM | POA: Diagnosis not present

## 2015-10-14 DIAGNOSIS — M1990 Unspecified osteoarthritis, unspecified site: Secondary | ICD-10-CM | POA: Diagnosis not present

## 2015-10-14 DIAGNOSIS — J441 Chronic obstructive pulmonary disease with (acute) exacerbation: Secondary | ICD-10-CM | POA: Diagnosis not present

## 2015-10-14 DIAGNOSIS — Z720 Tobacco use: Secondary | ICD-10-CM | POA: Diagnosis not present

## 2015-10-14 DIAGNOSIS — I10 Essential (primary) hypertension: Secondary | ICD-10-CM | POA: Diagnosis not present

## 2015-10-14 DIAGNOSIS — E785 Hyperlipidemia, unspecified: Secondary | ICD-10-CM | POA: Diagnosis not present

## 2015-10-16 DIAGNOSIS — E785 Hyperlipidemia, unspecified: Secondary | ICD-10-CM | POA: Diagnosis not present

## 2015-10-16 DIAGNOSIS — I10 Essential (primary) hypertension: Secondary | ICD-10-CM | POA: Diagnosis not present

## 2015-10-16 DIAGNOSIS — J440 Chronic obstructive pulmonary disease with acute lower respiratory infection: Secondary | ICD-10-CM | POA: Diagnosis not present

## 2015-10-16 DIAGNOSIS — J189 Pneumonia, unspecified organism: Secondary | ICD-10-CM | POA: Diagnosis not present

## 2015-10-16 DIAGNOSIS — Z720 Tobacco use: Secondary | ICD-10-CM | POA: Diagnosis not present

## 2015-10-16 DIAGNOSIS — J441 Chronic obstructive pulmonary disease with (acute) exacerbation: Secondary | ICD-10-CM | POA: Diagnosis not present

## 2015-10-16 DIAGNOSIS — M109 Gout, unspecified: Secondary | ICD-10-CM | POA: Diagnosis not present

## 2015-10-16 DIAGNOSIS — M1990 Unspecified osteoarthritis, unspecified site: Secondary | ICD-10-CM | POA: Diagnosis not present

## 2015-10-16 DIAGNOSIS — K219 Gastro-esophageal reflux disease without esophagitis: Secondary | ICD-10-CM | POA: Diagnosis not present

## 2015-10-16 DIAGNOSIS — I5021 Acute systolic (congestive) heart failure: Secondary | ICD-10-CM | POA: Diagnosis not present

## 2015-10-17 ENCOUNTER — Other Ambulatory Visit: Payer: Self-pay | Admitting: Family Medicine

## 2015-10-21 ENCOUNTER — Other Ambulatory Visit: Payer: Self-pay | Admitting: Family Medicine

## 2015-10-21 ENCOUNTER — Ambulatory Visit (INDEPENDENT_AMBULATORY_CARE_PROVIDER_SITE_OTHER): Payer: Medicare Other | Admitting: Family Medicine

## 2015-10-21 ENCOUNTER — Encounter: Payer: Self-pay | Admitting: Family Medicine

## 2015-10-21 VITALS — BP 135/73 | HR 75 | Temp 98.3°F | Ht 67.0 in | Wt 180.0 lb

## 2015-10-21 DIAGNOSIS — J189 Pneumonia, unspecified organism: Secondary | ICD-10-CM | POA: Diagnosis not present

## 2015-10-21 DIAGNOSIS — Z79899 Other long term (current) drug therapy: Secondary | ICD-10-CM | POA: Diagnosis not present

## 2015-10-21 DIAGNOSIS — I501 Left ventricular failure: Secondary | ICD-10-CM | POA: Insufficient documentation

## 2015-10-21 DIAGNOSIS — I5042 Chronic combined systolic (congestive) and diastolic (congestive) heart failure: Secondary | ICD-10-CM

## 2015-10-21 DIAGNOSIS — M25512 Pain in left shoulder: Secondary | ICD-10-CM

## 2015-10-21 DIAGNOSIS — M1A00X0 Idiopathic chronic gout, unspecified site, without tophus (tophi): Secondary | ICD-10-CM

## 2015-10-21 HISTORY — DX: Left ventricular failure, unspecified: I50.1

## 2015-10-21 HISTORY — DX: Chronic combined systolic (congestive) and diastolic (congestive) heart failure: I50.42

## 2015-10-21 MED ORDER — POTASSIUM CHLORIDE CRYS ER 20 MEQ PO TBCR: 20.0000 meq | EXTENDED_RELEASE_TABLET | Freq: Every day | ORAL | Status: DC

## 2015-10-21 MED ORDER — HYDROCODONE-ACETAMINOPHEN 5-325 MG PO TABS: 1.0000 | ORAL_TABLET | Freq: Two times a day (BID) | ORAL | Status: DC | PRN

## 2015-10-21 MED ORDER — TIZANIDINE HCL 2 MG PO TABS: 2.0000 mg | ORAL_TABLET | Freq: Four times a day (QID) | ORAL | Status: DC | PRN

## 2015-10-21 NOTE — Assessment & Plan Note (Signed)
Patient reports good compliance with her discharged medications. She states that she has run the complete course of antibiotics and prednisone and feels much better at this time. Lung sounds clear on exam today. No further interventions necessary at this time.

## 2015-10-21 NOTE — Assessment & Plan Note (Signed)
Patient placed on colchicine for gout. Obtaining CMP to assess patient's tolerance for this. - Continue colchicine. - Reassess after lab values are obtained.  Next: Will likely need to be placed on allopurinol for long-term prophylaxis. (Urate levels will be necessary at that time.)

## 2015-10-21 NOTE — Assessment & Plan Note (Signed)
Patient reports good compliance with her medications at this time. She states that she feels much better than she did previously and her breathing is under much better control. - Continue medication regimen as prescribed. - Obtain CMP to assess medication tolerance

## 2015-10-21 NOTE — Progress Notes (Signed)
HPI  CC: Hospital follow-up Patient is presenting here for hospital follow-up after an episode of acute respiratory failure secondary to acute on chronic systolic CHF and community acquired pneumonia. Patient states that she is been very compliant with her medications. She is not had any further symptoms that had brought her to the emergency department previously. Breathing has been adequate and back to baseline. She wished to discuss the purpose of her new medications as she feels as though they are working well.   Patient does endorse some occasional chest pain which lasts a period of minutes. She tends to experience this daily without any association with activity or stress. She states that pain is located across her sternum and feels like a cramp. She denies any worsening in shortness of breath during these times. She denies any left arm or jaw pain/numbness. She is currently not experiencing any of this chest pain. No other issues at this time.  ROS: Denies headache, dizziness, confusion, lightheadedness, abdominal pain, nausea, vomiting, diarrhea, dysuria, sputum production, numbness, weakness, or paresthesias.  Past medical history and social history reviewed and updated in the EMR as appropriate.  Objective: BP 135/73 mmHg  Pulse 75  Temp(Src) 98.3 F (36.8 C) (Oral)  Ht 5\' 7"  (1.702 m)  Wt 180 lb (81.647 kg)  BMI 28.19 kg/m2 Gen: NAD, alert, cooperative HEENT: NCAT, EOMI, PERRL, MMM CV: RRR  Resp: CTAB, no wheezes, non-labored Abd: SNTND, BS present, no guarding or organomegaly Ext: No edema, warm  Assessment and plan:  Chronic CHF (congestive heart failure) (HCC) Patient reports good compliance with her medications at this time. She states that she feels much better than she did previously and her breathing is under much better control. - Continue medication regimen as prescribed. - Obtain CMP to assess medication tolerance  CAP (community acquired pneumonia) Patient  reports good compliance with her discharged medications. She states that she has run the complete course of antibiotics and prednisone and feels much better at this time. Lung sounds clear on exam today. No further interventions necessary at this time.  Gout Patient placed on colchicine for gout. Obtaining CMP to assess patient's tolerance for this. - Continue colchicine. - Reassess after lab values are obtained.  Next: Will likely need to be placed on allopurinol for long-term prophylaxis. (Urate levels will be necessary at that time.)    Orders Placed This Encounter  Procedures  . COMPLETE METABOLIC PANEL WITH GFR    Meds ordered this encounter  Medications  . HYDROcodone-acetaminophen (NORCO) 5-325 MG tablet    Sig: Take 1 tablet by mouth 2 (two) times daily as needed.    Dispense:  60 tablet    Refill:  0  . HYDROcodone-acetaminophen (NORCO) 5-325 MG tablet    Sig: Take 1 tablet by mouth 2 (two) times daily as needed.    Dispense:  60 tablet    Refill:  0    Do not fill until 11/21/15 or after.  [name] tiZANidine (ZANAFLEX) 2 MG tablet    Sig: Take 1 tablet (2 mg total) by mouth every 6 (six) hours as needed for muscle spasms.    Dispense:  30 tablet    Refill:  1  . potassium chloride SA (K-DUR,KLOR-CON) 20 MEQ tablet    Sig: Take 1 tablet (20 mEq total) by mouth daily.    Dispense:  30 tablet    Refill:  1     Elberta Leatherwood, MD,MS,  PGY2 10/21/2015 5:42 PM

## 2015-10-21 NOTE — Patient Instructions (Signed)
It was a pleasure seeing you today in our clinic. Today we discussed your hospital visit. Here is the treatment plan we have discussed and agreed upon together:   - I will mail you the results of today's blood tests. If there are any changes needed to be made to your medication regimen I will contact you by phone. - I provided you new prescriptions for your pain medication.

## 2015-10-22 DIAGNOSIS — I5021 Acute systolic (congestive) heart failure: Secondary | ICD-10-CM | POA: Diagnosis not present

## 2015-10-22 DIAGNOSIS — M109 Gout, unspecified: Secondary | ICD-10-CM | POA: Diagnosis not present

## 2015-10-22 DIAGNOSIS — J440 Chronic obstructive pulmonary disease with acute lower respiratory infection: Secondary | ICD-10-CM | POA: Diagnosis not present

## 2015-10-22 DIAGNOSIS — J441 Chronic obstructive pulmonary disease with (acute) exacerbation: Secondary | ICD-10-CM | POA: Diagnosis not present

## 2015-10-22 DIAGNOSIS — Z720 Tobacco use: Secondary | ICD-10-CM | POA: Diagnosis not present

## 2015-10-22 DIAGNOSIS — M1990 Unspecified osteoarthritis, unspecified site: Secondary | ICD-10-CM | POA: Diagnosis not present

## 2015-10-22 DIAGNOSIS — K219 Gastro-esophageal reflux disease without esophagitis: Secondary | ICD-10-CM | POA: Diagnosis not present

## 2015-10-22 DIAGNOSIS — E785 Hyperlipidemia, unspecified: Secondary | ICD-10-CM | POA: Diagnosis not present

## 2015-10-22 DIAGNOSIS — I10 Essential (primary) hypertension: Secondary | ICD-10-CM | POA: Diagnosis not present

## 2015-10-22 DIAGNOSIS — J189 Pneumonia, unspecified organism: Secondary | ICD-10-CM | POA: Diagnosis not present

## 2015-10-22 LAB — COMPLETE METABOLIC PANEL WITH GFR
ALT: 11 U/L (ref 6–29)
AST: 13 U/L (ref 10–35)
Albumin: 4.1 g/dL (ref 3.6–5.1)
Alkaline Phosphatase: 76 U/L (ref 33–130)
BUN: 11 mg/dL (ref 7–25)
CALCIUM: 9 mg/dL (ref 8.6–10.4)
CHLORIDE: 100 mmol/L (ref 98–110)
CO2: 28 mmol/L (ref 20–31)
CREATININE: 0.62 mg/dL (ref 0.60–0.93)
GFR, Est African American: >89 mL/min (ref >=60)
GFR, Est Non African American: >89 mL/min (ref >=60)
Glucose, Bld: 106 mg/dL — ABNORMAL HIGH (ref 65–99)
Potassium: 3.7 mmol/L (ref 3.5–5.3)
Sodium: 137 mmol/L (ref 135–146)
TOTAL PROTEIN: 6.9 g/dL (ref 6.1–8.1)
Total Bilirubin: 0.5 mg/dL (ref 0.2–1.2)

## 2015-10-26 ENCOUNTER — Encounter: Payer: Self-pay | Admitting: Family Medicine

## 2015-10-28 ENCOUNTER — Other Ambulatory Visit: Payer: Self-pay | Admitting: Family Medicine

## 2015-10-28 DIAGNOSIS — J440 Chronic obstructive pulmonary disease with acute lower respiratory infection: Secondary | ICD-10-CM | POA: Diagnosis not present

## 2015-10-28 DIAGNOSIS — M1990 Unspecified osteoarthritis, unspecified site: Secondary | ICD-10-CM | POA: Diagnosis not present

## 2015-10-28 DIAGNOSIS — E785 Hyperlipidemia, unspecified: Secondary | ICD-10-CM | POA: Diagnosis not present

## 2015-10-28 DIAGNOSIS — I5021 Acute systolic (congestive) heart failure: Secondary | ICD-10-CM | POA: Diagnosis not present

## 2015-10-28 DIAGNOSIS — Z720 Tobacco use: Secondary | ICD-10-CM | POA: Diagnosis not present

## 2015-10-28 DIAGNOSIS — K219 Gastro-esophageal reflux disease without esophagitis: Secondary | ICD-10-CM | POA: Diagnosis not present

## 2015-10-28 DIAGNOSIS — I10 Essential (primary) hypertension: Secondary | ICD-10-CM | POA: Diagnosis not present

## 2015-10-28 DIAGNOSIS — J441 Chronic obstructive pulmonary disease with (acute) exacerbation: Secondary | ICD-10-CM | POA: Diagnosis not present

## 2015-10-28 DIAGNOSIS — M109 Gout, unspecified: Secondary | ICD-10-CM | POA: Diagnosis not present

## 2015-10-28 DIAGNOSIS — J189 Pneumonia, unspecified organism: Secondary | ICD-10-CM | POA: Diagnosis not present

## 2015-11-05 ENCOUNTER — Other Ambulatory Visit: Payer: Self-pay | Admitting: Student

## 2015-11-05 DIAGNOSIS — I5021 Acute systolic (congestive) heart failure: Secondary | ICD-10-CM | POA: Diagnosis not present

## 2015-11-05 DIAGNOSIS — J189 Pneumonia, unspecified organism: Secondary | ICD-10-CM | POA: Diagnosis not present

## 2015-11-05 DIAGNOSIS — I10 Essential (primary) hypertension: Secondary | ICD-10-CM | POA: Diagnosis not present

## 2015-11-05 DIAGNOSIS — Z720 Tobacco use: Secondary | ICD-10-CM | POA: Diagnosis not present

## 2015-11-05 DIAGNOSIS — M1990 Unspecified osteoarthritis, unspecified site: Secondary | ICD-10-CM | POA: Diagnosis not present

## 2015-11-05 DIAGNOSIS — K219 Gastro-esophageal reflux disease without esophagitis: Secondary | ICD-10-CM | POA: Diagnosis not present

## 2015-11-05 DIAGNOSIS — M109 Gout, unspecified: Secondary | ICD-10-CM | POA: Diagnosis not present

## 2015-11-05 DIAGNOSIS — E785 Hyperlipidemia, unspecified: Secondary | ICD-10-CM | POA: Diagnosis not present

## 2015-11-05 DIAGNOSIS — J441 Chronic obstructive pulmonary disease with (acute) exacerbation: Secondary | ICD-10-CM | POA: Diagnosis not present

## 2015-11-05 DIAGNOSIS — J440 Chronic obstructive pulmonary disease with acute lower respiratory infection: Secondary | ICD-10-CM | POA: Diagnosis not present

## 2015-11-12 DIAGNOSIS — E785 Hyperlipidemia, unspecified: Secondary | ICD-10-CM | POA: Diagnosis not present

## 2015-11-12 DIAGNOSIS — M1990 Unspecified osteoarthritis, unspecified site: Secondary | ICD-10-CM | POA: Diagnosis not present

## 2015-11-12 DIAGNOSIS — I10 Essential (primary) hypertension: Secondary | ICD-10-CM | POA: Diagnosis not present

## 2015-11-12 DIAGNOSIS — J440 Chronic obstructive pulmonary disease with acute lower respiratory infection: Secondary | ICD-10-CM | POA: Diagnosis not present

## 2015-11-12 DIAGNOSIS — Z720 Tobacco use: Secondary | ICD-10-CM | POA: Diagnosis not present

## 2015-11-12 DIAGNOSIS — I5021 Acute systolic (congestive) heart failure: Secondary | ICD-10-CM | POA: Diagnosis not present

## 2015-11-12 DIAGNOSIS — J189 Pneumonia, unspecified organism: Secondary | ICD-10-CM | POA: Diagnosis not present

## 2015-11-12 DIAGNOSIS — M109 Gout, unspecified: Secondary | ICD-10-CM | POA: Diagnosis not present

## 2015-11-12 DIAGNOSIS — K219 Gastro-esophageal reflux disease without esophagitis: Secondary | ICD-10-CM | POA: Diagnosis not present

## 2015-11-12 DIAGNOSIS — J441 Chronic obstructive pulmonary disease with (acute) exacerbation: Secondary | ICD-10-CM | POA: Diagnosis not present

## 2015-11-24 DIAGNOSIS — E785 Hyperlipidemia, unspecified: Secondary | ICD-10-CM | POA: Diagnosis not present

## 2015-11-24 DIAGNOSIS — I5021 Acute systolic (congestive) heart failure: Secondary | ICD-10-CM | POA: Diagnosis not present

## 2015-11-24 DIAGNOSIS — I10 Essential (primary) hypertension: Secondary | ICD-10-CM | POA: Diagnosis not present

## 2015-11-24 DIAGNOSIS — M109 Gout, unspecified: Secondary | ICD-10-CM | POA: Diagnosis not present

## 2015-11-24 DIAGNOSIS — J189 Pneumonia, unspecified organism: Secondary | ICD-10-CM | POA: Diagnosis not present

## 2015-11-24 DIAGNOSIS — Z720 Tobacco use: Secondary | ICD-10-CM | POA: Diagnosis not present

## 2015-11-24 DIAGNOSIS — M1990 Unspecified osteoarthritis, unspecified site: Secondary | ICD-10-CM | POA: Diagnosis not present

## 2015-11-24 DIAGNOSIS — J441 Chronic obstructive pulmonary disease with (acute) exacerbation: Secondary | ICD-10-CM | POA: Diagnosis not present

## 2015-11-24 DIAGNOSIS — K219 Gastro-esophageal reflux disease without esophagitis: Secondary | ICD-10-CM | POA: Diagnosis not present

## 2015-11-24 DIAGNOSIS — J440 Chronic obstructive pulmonary disease with acute lower respiratory infection: Secondary | ICD-10-CM | POA: Diagnosis not present

## 2015-12-07 ENCOUNTER — Other Ambulatory Visit: Payer: Self-pay | Admitting: Family Medicine

## 2015-12-28 ENCOUNTER — Other Ambulatory Visit: Payer: Self-pay | Admitting: Family Medicine

## 2015-12-29 ENCOUNTER — Other Ambulatory Visit: Payer: Self-pay | Admitting: Family Medicine

## 2015-12-29 NOTE — Telephone Encounter (Signed)
Patient actually wanted Toprol called in.  This has been done, in addition to her Zyrtec.  She gives Dr Alease Frame permission to discuss her recent Echo with [facility] to check eligibility for Heart Failure Program.  Will defer this phone call to him, as he will return tomorrow.

## 2015-12-29 NOTE — Telephone Encounter (Signed)
[employer]: pt has been identified to participate in congestive heart failure program.  Needs to verify she has medical diagnosis of congestive heart failure. If so, needs the most recent EF%.  Pt states she is out of her lisinopril. [facility] [address]

## 2016-01-04 ENCOUNTER — Encounter: Payer: Self-pay | Admitting: Family Medicine

## 2016-01-04 ENCOUNTER — Ambulatory Visit (INDEPENDENT_AMBULATORY_CARE_PROVIDER_SITE_OTHER): Payer: Medicare Other | Admitting: Family Medicine

## 2016-01-04 VITALS — BP 146/70 | HR 71 | Temp 98.3°F | Ht 67.0 in | Wt 182.0 lb

## 2016-01-04 DIAGNOSIS — M62838 Other muscle spasm: Secondary | ICD-10-CM | POA: Insufficient documentation

## 2016-01-04 DIAGNOSIS — I10 Essential (primary) hypertension: Secondary | ICD-10-CM

## 2016-01-04 DIAGNOSIS — M25512 Pain in left shoulder: Secondary | ICD-10-CM | POA: Diagnosis not present

## 2016-01-04 DIAGNOSIS — Z79899 Other long term (current) drug therapy: Secondary | ICD-10-CM | POA: Diagnosis not present

## 2016-01-04 DIAGNOSIS — M6248 Contracture of muscle, other site: Secondary | ICD-10-CM | POA: Diagnosis not present

## 2016-01-04 LAB — BASIC METABOLIC PANEL WITH GFR
BUN: 18 mg/dL (ref 7–25)
CHLORIDE: 101 mmol/L (ref 98–110)
CO2: 27 mmol/L (ref 20–31)
CREATININE: 0.72 mg/dL (ref 0.60–0.93)
Calcium: 9.4 mg/dL (ref 8.6–10.4)
GFR, EST NON AFRICAN AMERICAN: 85 mL/min (ref >=60)
GFR, Est African American: >89 mL/min (ref >=60)
Glucose, Bld: 96 mg/dL (ref 65–99)
POTASSIUM: 3.7 mmol/L (ref 3.5–5.3)
SODIUM: 142 mmol/L (ref 135–146)

## 2016-01-04 MED ORDER — HYDROCODONE-ACETAMINOPHEN 5-325 MG PO TABS: 1.0000 | ORAL_TABLET | Freq: Two times a day (BID) | ORAL | Status: DC | PRN

## 2016-01-04 MED ORDER — CYCLOBENZAPRINE HCL 10 MG PO TABS: 10.0000 mg | ORAL_TABLET | Freq: Two times a day (BID) | ORAL | Status: DC | PRN

## 2016-01-04 NOTE — Assessment & Plan Note (Signed)
Patient is presenting with signs and symptoms consistent with trapezius muscle spasm. No evidence of infectious process. No evidence of bony involvement or pathology. Spurling's negative and strength/sensation intact throughout making neuropathic etiology highly unlikely. - Flexeril twice a day when necessary for 2 weeks. - Ice/heat as needed/tolerated until pain resolution. - Follow-up as needed for persistent/worsening pain over the next 2 weeks.

## 2016-01-04 NOTE — Patient Instructions (Signed)
It was a pleasure seeing you today in our clinic. Today we discussed your medications and neck pain. Here is the treatment plan we have discussed and agreed upon together:   - I have ordered to have some blood work done to check the status of your kidneys. - Continue taking your blood pressure medications as prescribed. - I believe your neck pain is likely due to irritation and spasming of the muscles around her neck. I prescribed Flexeril. Take this twice a day as needed for your pain and discomfort.

## 2016-01-04 NOTE — Assessment & Plan Note (Signed)
Stable: Patient endorses good compliance with her medication regimen at this time. She has not had labs to check her electrolytes and kidney function in some time. - Obtain BMP today - Provide refills as needed.

## 2016-01-04 NOTE — Progress Notes (Signed)
   HPI  CC: neck pain Patient is here with new onset neck pain 1 week. She denies any trauma or injury to this area. She does not know of any inciting event. She states that it is painful to the touch and that it feels tight along the base of her right neck. She denies any initial range of motion, headache, fevers, chills, TMJ pain, dysphagia, weakness, numbness, nausea, vomiting, diarrhea. Patient has not taken any medication for this.  HTN: - Patient also states that she is needing refills on her hypertensive medications. She endorses good compliance with her medications. Denies vision changes, headache, or presyncopal events. No other issues to report at this time.  Review of Systems   See HPI for ROS. All other systems reviewed and are negative.  CC, SH/smoking status, and VS noted  Objective: BP 146/70 mmHg  Pulse 71  Temp(Src) 98.3 F (36.8 C) (Oral)  Ht 5\' 7"  (1.702 m)  Wt 182 lb (82.555 kg)  BMI 28.50 kg/m2  SpO2 98% Gen: NAD, alert, cooperative, and pleasant. HEENT: NCAT, EOMI, PERRL, no JVD, TMJ FROM, neck FROM, notable muscle tightness noted at the base of the right neck. Tenderness to palpation over the area of muscle tightness/irritation. No tenderness over the spinous processes. Ext: No edema, warm, strength 5/5, sensation intact throughout, Spurling's negative. Neuro: Alert and oriented, Speech clear, No gross deficits  Assessment and plan:  Trapezius muscle spasm Patient is presenting with signs and symptoms consistent with trapezius muscle spasm. No evidence of infectious process. No evidence of bony involvement or pathology. Spurling's negative and strength/sensation intact throughout making neuropathic etiology highly unlikely. - Flexeril twice a day when necessary for 2 weeks. - Ice/heat as needed/tolerated until pain resolution. - Follow-up as needed for persistent/worsening pain over the next 2 weeks.  Hypertension Stable: Patient endorses good compliance  with her medication regimen at this time. She has not had labs to check her electrolytes and kidney function in some time. - Obtain BMP today - Provide refills as needed.    Orders Placed This Encounter  Procedures  . BASIC METABOLIC PANEL WITH GFR    Meds ordered this encounter  Medications  . cyclobenzaprine (FLEXERIL) 10 MG tablet    Sig: Take 1 tablet (10 mg total) by mouth 2 (two) times daily as needed for muscle spasms.    Dispense:  30 tablet    Refill:  0  . HYDROcodone-acetaminophen (NORCO) 5-325 MG tablet    Sig: Take 1 tablet by mouth 2 (two) times daily as needed.    Dispense:  60 tablet    Refill:  0  . HYDROcodone-acetaminophen (NORCO) 5-325 MG tablet    Sig: Take 1 tablet by mouth 2 (two) times daily as needed.    Dispense:  60 tablet    Refill:  0    Do not fill until 02/04/16 or after.     Elberta Leatherwood, MD,MS,  PGY2 01/04/2016 5:37 PM

## 2016-01-05 ENCOUNTER — Encounter: Payer: Self-pay | Admitting: Family Medicine

## 2016-02-20 ENCOUNTER — Other Ambulatory Visit: Payer: Self-pay | Admitting: Family Medicine

## 2016-02-23 ENCOUNTER — Other Ambulatory Visit: Payer: Self-pay | Admitting: Family Medicine

## 2016-02-23 MED ORDER — CYCLOBENZAPRINE HCL 10 MG PO TABS: 10.0000 mg | ORAL_TABLET | Freq: Two times a day (BID) | ORAL | Status: DC | PRN

## 2016-02-23 MED ORDER — METOPROLOL SUCCINATE ER 25 MG PO TB24: ORAL_TABLET | ORAL | Status: DC

## 2016-02-24 ENCOUNTER — Other Ambulatory Visit: Payer: Self-pay | Admitting: Family Medicine

## 2016-02-26 ENCOUNTER — Ambulatory Visit: Payer: Medicare Other | Admitting: Family Medicine

## 2016-02-26 ENCOUNTER — Other Ambulatory Visit: Payer: Self-pay | Admitting: *Deleted

## 2016-02-26 MED ORDER — LISINOPRIL 20 MG PO TABS: 40.0000 mg | ORAL_TABLET | Freq: Every day | ORAL | Status: DC

## 2016-02-26 NOTE — Telephone Encounter (Signed)
Refill request for 90 day supply.

## 2016-03-08 ENCOUNTER — Other Ambulatory Visit: Payer: Self-pay | Admitting: *Deleted

## 2016-03-08 NOTE — Telephone Encounter (Signed)
Request for 90 day supply. Martin, Tamika L, RN  

## 2016-03-09 MED ORDER — POTASSIUM CHLORIDE CRYS ER 20 MEQ PO TBCR: EXTENDED_RELEASE_TABLET | ORAL | Status: DC

## 2016-03-09 MED ORDER — METOPROLOL SUCCINATE ER 25 MG PO TB24: ORAL_TABLET | ORAL | Status: DC

## 2016-03-09 MED ORDER — LISINOPRIL 20 MG PO TABS: 40.0000 mg | ORAL_TABLET | Freq: Every day | ORAL | Status: DC

## 2016-03-09 MED ORDER — FUROSEMIDE 20 MG PO TABS: ORAL_TABLET | ORAL | Status: DC

## 2016-04-01 ENCOUNTER — Ambulatory Visit (INDEPENDENT_AMBULATORY_CARE_PROVIDER_SITE_OTHER): Payer: Medicare Other | Admitting: Family Medicine

## 2016-04-01 ENCOUNTER — Ambulatory Visit (HOSPITAL_COMMUNITY)
Admission: RE | Admit: 2016-04-01 | Discharge: 2016-04-01 | Disposition: A | Discharge status: Home / Self Care | Payer: Medicare Other | Source: Ambulatory Visit | Attending: Family Medicine | Admitting: Family Medicine

## 2016-04-01 ENCOUNTER — Encounter: Payer: Self-pay | Admitting: Family Medicine

## 2016-04-01 VITALS — BP 139/76 | HR 80 | Temp 98.4°F | Ht 67.0 in | Wt 186.4 lb

## 2016-04-01 DIAGNOSIS — L600 Ingrowing nail: Secondary | ICD-10-CM

## 2016-04-01 DIAGNOSIS — M25512 Pain in left shoulder: Secondary | ICD-10-CM

## 2016-04-01 DIAGNOSIS — R05 Cough: Secondary | ICD-10-CM | POA: Insufficient documentation

## 2016-04-01 DIAGNOSIS — I5042 Chronic combined systolic (congestive) and diastolic (congestive) heart failure: Secondary | ICD-10-CM | POA: Diagnosis not present

## 2016-04-01 DIAGNOSIS — R059 Cough, unspecified: Secondary | ICD-10-CM

## 2016-04-01 MED ORDER — HYDROCODONE-ACETAMINOPHEN 5-325 MG PO TABS: 1.0000 | ORAL_TABLET | Freq: Two times a day (BID) | ORAL | Status: DC | PRN

## 2016-04-01 MED ORDER — PREDNISONE 50 MG PO TABS: 50.0000 mg | ORAL_TABLET | Freq: Every day | ORAL | Status: DC

## 2016-04-01 MED ORDER — LOSARTAN POTASSIUM 100 MG PO TABS: 100.0000 mg | ORAL_TABLET | Freq: Every day | ORAL | Status: DC

## 2016-04-01 NOTE — Assessment & Plan Note (Signed)
Notable pain and irritation at the left great toe. No evidence of infection at this time. - Referral to podiatry

## 2016-04-01 NOTE — Assessment & Plan Note (Signed)
Patient is here with a cough over the past 1 month. Etiology currently unknown however there is some significant concern for noninfectious cause, as patient has been afebrile and without sick contacts during this time. Differential includes CHF, versus ace allergy, versus COPD. Patient has a history significant for smoking and hypertension. She has a known diagnosis of CHF with reduced EF and grade 2 diastolic dysfunction. There was no significant finding on exam to suggest severe CHF exacerbation at this time however patient's weight is slightly elevated and some lower extremities edema was present.  - EKG obtained in clinic. No significant changes from previous EKG. - Discontinued ACE inhibitor. Initiated ARB therapy. - Increase Lasix dose frequency to twice a day from daily 5 days - Referral to cardiology. - Red flag symptoms discussed

## 2016-04-01 NOTE — Assessment & Plan Note (Signed)
High suspicion for muscle spasm/irritation. Due to the broad nature of the pain as well as her comorbidity of CHF and some concern for possible angina (although EKG was reassuring) I do not feel as though NSAIDs would be appropriate at this time. - Prednisone burst. 50 mg daily 5 days

## 2016-04-01 NOTE — Patient Instructions (Addendum)
It was a pleasure seeing you today in our clinic. Today we discussed your neck pain and cough. Here is the treatment plan we have discussed and agreed upon together:   - Today I change your medication, lisinopril, to losartan. Take this medication 1 time daily as you had taken the previous medication. - Today prescribed prednisone. Take 1 tablet every morning for the next 5 days. This should help with your neck shoulder and arm pain. - For the next 5 days had like you to take your Lasix twice a day instead of just once. Stop taking this medicine twice a day when you are finished with the prednisone prescription. - I have placed a referral to podiatry for your left great toe. - I have placed a referral to cardiology for your history of heart disease.

## 2016-04-01 NOTE — Progress Notes (Signed)
HPI  CC: COUGH Worse at night. Cough is dry, can occasionally cough up white foam. SOB and nausea. No sick contacts. Some lower extremety swelling swelling but nothing too bad. She states that she occasionally has some left neck and shoulder pain. This pain has been getting worse over the past 2 weeks. She states that she has had this pain in the past and does not endorse any worsening of the pain with ambulation.  Has been coughing for ~1 month. Cough is: dry Sputum production: Yes, occasionally has white frothy sputum Medications tried: Cough drops with minimal relief Taking blood pressure medications: Yes  Symptoms Runny nose: No Mucous in back of throat: No Throat burning or reflux: No Wheezing or asthma: No Fever: No Chest Pain: Yes, with coughing as well as some neck/left shoulder pain at rest. Shortness of breath: Yes Leg swelling: Minimal Hemoptysis: No Weight loss: No  ROS see HPI Smoking Status noted: smoker   Objective: BP 139/76 mmHg  Pulse 80  Temp(Src) 98.4 F (36.9 C) (Oral)  Ht 5\' 7"  (1.702 m)  Wt 186 lb 6.4 oz (84.55 kg)  BMI 29.19 kg/m2 Gen: NAD, alert, cooperative, and pleasant. CV: RRR, no murmur Resp: CTAB, no wheezes or crackles, non-labored MSK: Left neck with some tenderness to palpation. Full range of motion. Some muscle tightness noted at the base of the neck. No rash present. Left shoulder noted to have pain but was not reproducible with exam. Shoulder Exam was consistent with previous exams. Minimal/+1 edema in bilateral lower extremities present. Tenderness and irritation noted at the left great toe. Neuro: Alert and oriented, Speech clear, No gross deficits  Assessment and plan:  Cough Patient is here with a cough over the past 1 month. Etiology currently unknown however there is some significant concern for noninfectious cause, as patient has been afebrile and without sick contacts during this time. Differential includes CHF, versus ace  allergy, versus COPD. Patient has a history significant for smoking and hypertension. She has a known diagnosis of CHF with reduced EF and grade 2 diastolic dysfunction. There was no significant finding on exam to suggest severe CHF exacerbation at this time however patient's weight is slightly elevated and some lower extremities edema was present.  - EKG obtained in clinic. No significant changes from previous EKG. - Discontinued ACE inhibitor. Initiated ARB therapy. - Increase Lasix dose frequency to twice a day from daily 5 days - Referral to cardiology. - Red flag symptoms discussed  Left shoulder pain High suspicion for muscle spasm/irritation. Due to the broad nature of the pain as well as her comorbidity of CHF and some concern for possible angina (although EKG was reassuring) I do not feel as though NSAIDs would be appropriate at this time. - Prednisone burst. 50 mg daily 5 days  Ingrowing left great toenail Notable pain and irritation at the left great toe. No evidence of infection at this time. - Referral to podiatry    Orders Placed This Encounter  Procedures  . Ambulatory referral to Podiatry    Referral Priority:  Routine    Referral Type:  Consultation    Referral Reason:  Specialty Services Required    Requested Specialty:  Podiatry    Number of Visits Requested:  1  . Ambulatory referral to Cardiology    Referral Priority:  Routine    Referral Type:  Consultation    Referral Reason:  Specialty Services Required    Requested Specialty:  Cardiology    Number  of Visits Requested:  1  . EKG 12-Lead    Meds ordered this encounter  Medications  . predniSONE (DELTASONE) 50 MG tablet    Sig: Take 1 tablet (50 mg total) by mouth daily with breakfast.    Dispense:  5 tablet    Refill:  0  . HYDROcodone-acetaminophen (NORCO) 5-325 MG tablet    Sig: Take 1 tablet by mouth 2 (two) times daily as needed.    Dispense:  60 tablet    Refill:  0  . losartan (COZAAR) 100 MG  tablet    Sig: Take 1 tablet (100 mg total) by mouth daily.    Dispense:  90 tablet    Refill:  3     Elberta Leatherwood, MD,MS,  PGY2 04/01/2016 7:01 PM

## 2016-04-05 ENCOUNTER — Other Ambulatory Visit: Payer: Self-pay | Admitting: Family Medicine

## 2016-04-05 ENCOUNTER — Telehealth: Payer: Self-pay | Admitting: Family Medicine

## 2016-04-05 DIAGNOSIS — R5383 Other fatigue: Secondary | ICD-10-CM

## 2016-04-05 NOTE — Telephone Encounter (Signed)
Pt participates in congestive heart failure program with [employer].Today she has a weight drop of 3.4 lbs.  She is having some cramping and dizzyness. Her medicinces were changed at her visit on Friday. Please advise

## 2016-04-05 NOTE — Telephone Encounter (Signed)
I had increased her Lasix from Q-daily to BID (for 5 total days) at last visit. My hope was to push out some extra fluid so I am pleased with this result.   That said, I do NOT like her having any dizziness. I would like to know what her BP is like.   1) Please have her STOP taking the BID lasix and resume this medicine just once a day. If her BP is normotensive then my hope is that this will improve her dizziness. 2) I would like to have her come in for a BP check this Thursday or Friday (or sooner if....) 2) If she is hypotensive, or if she does not know what her BP is -- please have her come in for a quick BP check in the clinic. 3) If she EVER feels as though she may faint -- have her evaluated in our clinic that day or the ED if our office is closed. 4) I have written some future labs to be drawn -- These should be taken next time she is seen in our clinic (hopefully Friday) As I think they would be useful to be drawn prior to her appointment w/ Cardiology on the 16th of this month.  Any questions? I'd be happy to answer!

## 2016-04-06 NOTE — Telephone Encounter (Signed)
Please call patient back with the information below. [name] who is with [facility] called back and would like [name] to call the patient again. jw

## 2016-04-06 NOTE — Telephone Encounter (Signed)
Left message on patient on voicemail to call back. Also left voicemail for [place] with [facility].

## 2016-04-07 NOTE — Telephone Encounter (Signed)
Spoke with patient and gave her message from MD, she states she is no longer having the cramping and dizziness. Will decrease Lasix to once a day and come in for BP check tomorrow. Patient states she will call back to set up nurse visit for tomorrow because she has to find a ride.

## 2016-04-12 NOTE — Progress Notes (Addendum)
Cardiology Office Note   Date:  04/13/2016   ID:  Donna Herrera, DOB Jul 28, 1946, MRN [id number]  PCP:  Donna Lynch, MD  Cardiologist:   Donna Carnes, MD   Pt referred for eval of SOB      History of Present Illness: Donna Herrera is a 70 y.o. female with a history of cough  Pt treated with lasix   Echo in 2016 LVEF was 35 to 40%   RVEF normal PAP 49 mm Hg   Last month was bad  Couldn't talk/walk  Coughing constant    Pt denies having chest pain  Breathing is bettter now with no cough  Tr edema  No PND      Outpatient Prescriptions Prior to Visit  Medication Sig Dispense Refill  . albuterol (PROVENTIL HFA;VENTOLIN HFA) 108 (90 BASE) MCG/ACT inhaler Inhale 2 puffs into the lungs every 4 (four) hours as needed for wheezing or shortness of breath. 1 Inhaler 0  . aspirin 81 MG tablet Take 1 tablet (81 mg total) by mouth daily. 90 tablet 3  . cetirizine (ZYRTEC) 10 MG tablet TAKE 1 TABLET (10 MG TOTAL) BY MOUTH DAILY. 30 tablet 0  . colchicine 0.6 MG tablet TAKE 1 TABLET BY MOUTH 2 TIMES DAILY 62 tablet 5  . cyclobenzaprine (FLEXERIL) 10 MG tablet Take 1 tablet (10 mg total) by mouth 2 (two) times daily as needed for muscle spasms. 30 tablet 0  . furosemide (LASIX) 20 MG tablet TAKE 1 TABLET (20 MG TOTAL) BY MOUTH DAILY. 30 tablet 1  . HYDROcodone-acetaminophen (NORCO) 5-325 MG tablet Take 1 tablet by mouth 2 (two) times daily as needed. 60 tablet 0  . losartan (COZAAR) 100 MG tablet Take 1 tablet (100 mg total) by mouth daily. 90 tablet 3  . metoprolol succinate (TOPROL-XL) 25 MG 24 hr tablet TAKE 3 TABLETS BY MOUTH DAILY WITH OR IMMEDIATELY FOLLOWING A MEAL 90 tablet 5  . pantoprazole (PROTONIX) 20 MG tablet TAKE 2 TABLETS (40 MG TOTAL) BY MOUTH DAILY. 180 tablet 3  . potassium chloride SA (KLOR-CON M20) 20 MEQ tablet TAKE 1 TABLET (20 MEQ TOTAL) BY MOUTH DAILY. 30 tablet 1  . tiZANidine (ZANAFLEX) 2 MG tablet Take 1 tablet (2 mg total) by mouth every 6 (six) hours as needed for  muscle spasms. 30 tablet 1  . triamcinolone ointment (KENALOG) 0.5 % Apply 1 application topically 2 (two) times daily. To palms 30 g 3  . dextromethorphan-guaiFENesin (MUCINEX DM) 30-600 MG 12hr tablet Take 2 tablets by mouth 2 (two) times daily. Take for 5 days then stop. (Patient not taking: Reported on 04/13/2016) 20 tablet 0  . predniSONE (DELTASONE) 50 MG tablet Take 1 tablet (50 mg total) by mouth daily with breakfast. (Patient not taking: Reported on 04/13/2016) 5 tablet 0   No facility-administered medications prior to visit.     Allergies:   Morphine and related   Past Medical History  Diagnosis Date  . Allergy   . Arthritis   . Hypertension   . GERD (gastroesophageal reflux disease)   . Chronic pain   . Hyperlipidemia   . Gout   . H/O slipped capital femoral epiphysis (SCFE)     Past Surgical History  Procedure Laterality Date  . Cesarean section    . Tubal ligation    . Total hip arthroplasty  1987    right. Donna. Collier Herrera  . Joint replacement  [place]  . Hip fusion  left   . Rotator cuff repair      bilateral  . Cardiac catheterization  2005    normal-[place]     Social History:  The patient  reports that she has been smoking.  She does not have any smokeless tobacco history on file. She reports that she drinks alcohol. She reports that she does not use illicit drugs.   Family History:  The patient's family history includes Cancer in her father; Depression in her mother; Diabetes in her mother and sister; Heart disease in her mother; Hypertension in her mother; Stroke in her mother.    ROS:  Please see the history of present illness. All other systems are reviewed and  Negative to the above problem except as noted.    PHYSICAL EXAM: VS:  BP 138/68 mmHg  Pulse 66  Ht 5\' 7"  (1.702 m)  Wt 185 lb 12.8 oz (84.278 kg)  BMI 29.09 kg/m2  SpO2 97%  GEN: Well nourished, well developed, in no acute distress HEENT: normal Neck: no JVD,  carotid bruits, or masses Cardiac: RRR; no murmurs, rubs, or gallops,  Trace edema  Respiratory:  clear to auscultation bilaterally, normal work of breathing GI: soft, nontender, nondistended, + BS  No hepatomegaly  MS: no deformity Moving all extremities   Skin: warm and dry, no rash Neuro:  Strength and sensation are intact Psych: euthymic mood, full affect   EKG:  EKG is not ordered today.   Lipid Panel    Component Value Date/Time   CHOL 177 10/04/2015 0141   TRIG 78 10/04/2015 0141   HDL 50 10/04/2015 0141   CHOLHDL 3.5 10/04/2015 0141   VLDL 16 10/04/2015 0141   LDLCALC 111* 10/04/2015 0141      Wt Readings from Last 3 Encounters:  04/13/16 185 lb 12.8 oz (84.278 kg)  04/01/16 186 lb 6.4 oz (84.55 kg)  01/04/16 182 lb (82.555 kg)      ASSESSMENT AND PLAN:  1  Chronic systolic CHF  Volume status is not too bad  What has never been determined is cause for CHF   ? Ischemia  EKG from past does not show Q waves  I discussed with pt cath and stress testing (pro/con)  She is undecided  Would like to review with primary MD   Keep on same meds for now  WIll review labs      Signed, Donna Carnes, MD  04/13/2016 3:39 PM    Donna Herrera, [place], [place]  [id number] Phone: [phone]; Fax: [phone]   May 09, 2016  Pt has spoken with primary MD (Donna Herrera)  She is agreeable to cath  Will plan on R and L heart cath at [facility].  Risk/benefits had been duscussed  Pt agrees to proceed.    Donna Herrera

## 2016-04-13 ENCOUNTER — Encounter: Payer: Self-pay | Admitting: Internal Medicine

## 2016-04-13 ENCOUNTER — Ambulatory Visit (INDEPENDENT_AMBULATORY_CARE_PROVIDER_SITE_OTHER): Payer: Medicare Other | Admitting: Internal Medicine

## 2016-04-13 VITALS — BP 138/68 | HR 66 | Ht 67.0 in | Wt 185.8 lb

## 2016-04-13 DIAGNOSIS — I5022 Chronic systolic (congestive) heart failure: Secondary | ICD-10-CM | POA: Diagnosis not present

## 2016-04-14 ENCOUNTER — Telehealth: Payer: Self-pay | Admitting: Family Medicine

## 2016-04-14 NOTE — Telephone Encounter (Signed)
Pt returned Dr Maryellen Pile call

## 2016-04-21 ENCOUNTER — Telehealth: Payer: Self-pay | Admitting: Internal Medicine

## 2016-04-21 ENCOUNTER — Ambulatory Visit: Payer: Medicare Other | Admitting: Podiatry

## 2016-04-21 DIAGNOSIS — Z01812 Encounter for preprocedural laboratory examination: Secondary | ICD-10-CM

## 2016-04-21 NOTE — Telephone Encounter (Signed)
Spoke with pt and she states that Dr. Harrington Challenger was going to speak with Dr. Alease Frame about doing either a stress test or a heart cath. Advised pt that I do not see any information in regards to whether or not they have spoken yet. Advised I will route this message to Dr. Harrington Challenger for review and advisement and we will call her with Dr. Harrington Challenger' response. Pt verbalized understanding and was in agreement with this plan.

## 2016-04-21 NOTE — Telephone Encounter (Signed)
New Message  Pt stated that at last OV- they discussed a possible test to do @ Dr Alease Frame- pt wanted to folllow up on that. Please call back and discuss.

## 2016-04-23 NOTE — Telephone Encounter (Signed)
I have sent msg   I have not heard back from him

## 2016-04-25 NOTE — Telephone Encounter (Signed)
I placed call to pt but mailbox was full.

## 2016-04-27 NOTE — Telephone Encounter (Signed)
I was able to reach the patient. Advised that Dr. Harrington Challenger did not hear back from patient's PCP, Dr. Alease Frame. Patient stated she called his office as well but had not yet received a call back.  She denies SOB, cough, swelling, or chest pain.  Feels she is doing pretty well at this time.  I asked her to call PCP again if she wants to review with him whether or not she should have a stress test or heart cath.  She will.  Advised she may need to make an appointment with Dr. Alease Frame to discuss.  She verbalizes understanding and agreement.  I asked her to call back with update as to whether or not she is able to get her concerns addressed/outcome.

## 2016-05-04 ENCOUNTER — Encounter: Payer: Self-pay | Admitting: Family Medicine

## 2016-05-04 ENCOUNTER — Ambulatory Visit (INDEPENDENT_AMBULATORY_CARE_PROVIDER_SITE_OTHER): Payer: Medicare Other | Admitting: Family Medicine

## 2016-05-04 VITALS — BP 174/76 | HR 63 | Temp 98.4°F | Wt 186.0 lb

## 2016-05-04 DIAGNOSIS — M25512 Pain in left shoulder: Secondary | ICD-10-CM | POA: Diagnosis not present

## 2016-05-04 DIAGNOSIS — I5042 Chronic combined systolic (congestive) and diastolic (congestive) heart failure: Secondary | ICD-10-CM

## 2016-05-04 MED ORDER — FUROSEMIDE 20 MG PO TABS: ORAL_TABLET | ORAL | Status: DC

## 2016-05-04 MED ORDER — HYDROCODONE-ACETAMINOPHEN 5-325 MG PO TABS: 1.0000 | ORAL_TABLET | Freq: Two times a day (BID) | ORAL | Status: DC | PRN

## 2016-05-04 NOTE — Assessment & Plan Note (Signed)
Stable: Pain appears to be at baseline. Medications are currently helping. Refilled medications, no change in therapy.

## 2016-05-04 NOTE — Patient Instructions (Signed)
It was a pleasure seeing you today in our clinic. Today we discussed your appointments with your cardiologist. Here is the treatment plan we have discussed and agreed upon together:   - I think that it is a good idea to go forward with getting the cardiac catheterization. I will contact Dr. Harrington Challenger later today and discussed this with her. - Call Dr. Alan Ripper office to schedule an appointment in the near future.

## 2016-05-04 NOTE — Assessment & Plan Note (Signed)
Stable: Patient continues to have symptoms of worsening CHF. Cardiology has recommended cardiac catheterization versus stress test. Physical exam is relatively unchanged from previous - After further discussion with patient about these options she has decided to move forward with a cardiac catheterization. - I have called Dr. Alan Ripper office to inform them of this decision. I was able to talk to Dr. Alan Ripper RN [facility]) and she informed me she would pass this information on to Dr. Harrington Challenger.

## 2016-05-04 NOTE — Progress Notes (Signed)
   HPI  CC: Follow-up heart failure Patient is here to discuss recommendations from her cardiologist. She was recently referred to cardiology due to a cough and evidence of worsening CHF. At that visit the recommendation for cardiac catheterization versus stress test was given. Patient is here to discuss these options today. She is unsure which of these would be most beneficial at this time she does not want to go under any unnecessary procedures.  Symptoms have continued. She is still has a persistent cough and feelings of fatigue. Fatigue and occasional shortness of breath are present with prolonged activity.  We discussed these symptoms as being part of her worsening CHF. After discussion about details of the procedure as well as the data that each procedure will obtain patient was able to come to a decision of her own.  Review of Systems   See HPI for ROS. All other systems reviewed and are negative.  CC, SH/smoking status, and VS noted  Objective: BP 174/76 mmHg  Pulse 63  Temp(Src) 98.4 F (36.9 C) (Oral)  Wt 186 lb (84.369 kg)  SpO2 93% Gen: NAD, alert, cooperative, and pleasant. CV: RRR, no murmur Resp: CTAB, no wheezes or crackles, non-labored MSK: Some muscle tightness noted at the base of the neck. No rash present. Left shoulder noted to have pain but was not reproducible with exam. Shoulder Exam was consistent with previous exams. Minimal/+1 edema in bilateral lower extremities present. Neuro: Alert and oriented, Speech clear, No gross deficits  Assessment and plan:  Chronic CHF (congestive heart failure) (HCC) Stable: Patient continues to have symptoms of worsening CHF. Cardiology has recommended cardiac catheterization versus stress test. Physical exam is relatively unchanged from previous - After further discussion with patient about these options she has decided to move forward with a cardiac catheterization. - I have called Dr. Alan Ripper office to inform them of this  decision. I was able to talk to Dr. Alan Ripper RN [facility]) and she informed me she would pass this information on to Dr. Harrington Challenger.   Left shoulder pain Stable: Pain appears to be at baseline. Medications are currently helping. Refilled medications, no change in therapy.    Meds ordered this encounter  Medications  . HYDROcodone-acetaminophen (NORCO) 5-325 MG tablet    Sig: Take 1 tablet by mouth 2 (two) times daily as needed.    Dispense:  60 tablet    Refill:  0  . furosemide (LASIX) 20 MG tablet    Sig: TAKE 1 TABLET (20 MG TOTAL) BY MOUTH DAILY.    Dispense:  90 tablet    Refill:  3     Elberta Leatherwood, MD,MS,  PGY3 05/04/2016 5:34 PM

## 2016-05-04 NOTE — Telephone Encounter (Signed)
Phone call from Dr. Alease Frame. Patient was seen in his clinic today. They discussed stress test vs heart cath. Per Dr. Alease Frame the patient has decided on having cardiac catheterization if that is what Dr. Harrington Challenger is still recommending.  Advised him I will inform Dr. Harrington Challenger and then will contact the patient to arrange, based on her recommendations.

## 2016-05-05 NOTE — Telephone Encounter (Signed)
Message     Please contact pt and set up for R and L heart catheterization    Will need precath labs

## 2016-05-06 ENCOUNTER — Encounter: Payer: Self-pay | Admitting: *Deleted

## 2016-05-06 NOTE — Telephone Encounter (Signed)
Left and right heart cath scheduled for Wed, 05/11/16 with [place] at 10:30 am.  Pt to arrive at 8:30 am. She will come for labs on Monday 05/09/16.  Instruction letter placed at the front desk for her to pick up at that time. Pt is aware.  Staff message sent to pre cert.

## 2016-05-09 ENCOUNTER — Other Ambulatory Visit (INDEPENDENT_AMBULATORY_CARE_PROVIDER_SITE_OTHER): Payer: Medicare Other | Admitting: *Deleted

## 2016-05-09 DIAGNOSIS — Z01812 Encounter for preprocedural laboratory examination: Secondary | ICD-10-CM | POA: Diagnosis not present

## 2016-05-09 DIAGNOSIS — Z7901 Long term (current) use of anticoagulants: Secondary | ICD-10-CM | POA: Diagnosis not present

## 2016-05-09 LAB — BASIC METABOLIC PANEL
BUN: 12 mg/dL (ref 7–25)
CALCIUM: 9 mg/dL (ref 8.6–10.4)
CO2: 25 mmol/L (ref 20–31)
Chloride: 103 mmol/L (ref 98–110)
Creat: 0.64 mg/dL (ref 0.60–0.93)
Glucose, Bld: 118 mg/dL — ABNORMAL HIGH (ref 65–99)
POTASSIUM: 4.2 mmol/L (ref 3.5–5.3)
SODIUM: 140 mmol/L (ref 135–146)

## 2016-05-09 LAB — CBC
HCT: 41.8 % (ref 35.0–45.0)
HEMOGLOBIN: 13.8 g/dL (ref 11.7–15.5)
MCH: 29.1 pg (ref 27.0–33.0)
MCHC: 33 g/dL (ref 32.0–36.0)
MCV: 88.2 fL (ref 80.0–100.0)
MPV: 11 fL (ref 7.5–12.5)
Platelets: 244 10*3/uL (ref 140–400)
RBC: 4.74 MIL/uL (ref 3.80–5.10)
RDW: 13.5 % (ref 11.0–15.0)
WBC: 5.1 10*3/uL (ref 3.8–10.8)

## 2016-05-09 LAB — PROTIME-INR
INR: 1
PROTHROMBIN TIME: 11.1 s (ref 9.0–11.5)

## 2016-05-11 ENCOUNTER — Encounter (HOSPITAL_COMMUNITY)
Admission: RE | Disposition: A | Discharge status: Home / Self Care | Payer: Self-pay | Source: Ambulatory Visit | Attending: Cardiology

## 2016-05-11 ENCOUNTER — Ambulatory Visit (HOSPITAL_COMMUNITY)
Admission: RE | Admit: 2016-05-11 | Discharge: 2016-05-11 | Disposition: A | Discharge status: Home / Self Care | Payer: Medicare Other | Source: Ambulatory Visit | Attending: Cardiology | Admitting: Cardiology

## 2016-05-11 DIAGNOSIS — E785 Hyperlipidemia, unspecified: Secondary | ICD-10-CM | POA: Insufficient documentation

## 2016-05-11 DIAGNOSIS — Z96641 Presence of right artificial hip joint: Secondary | ICD-10-CM | POA: Diagnosis not present

## 2016-05-11 DIAGNOSIS — I501 Left ventricular failure: Secondary | ICD-10-CM

## 2016-05-11 DIAGNOSIS — M1990 Unspecified osteoarthritis, unspecified site: Secondary | ICD-10-CM | POA: Diagnosis not present

## 2016-05-11 DIAGNOSIS — I5042 Chronic combined systolic (congestive) and diastolic (congestive) heart failure: Secondary | ICD-10-CM

## 2016-05-11 DIAGNOSIS — I5022 Chronic systolic (congestive) heart failure: Secondary | ICD-10-CM | POA: Insufficient documentation

## 2016-05-11 DIAGNOSIS — Z8249 Family history of ischemic heart disease and other diseases of the circulatory system: Secondary | ICD-10-CM | POA: Insufficient documentation

## 2016-05-11 DIAGNOSIS — Z833 Family history of diabetes mellitus: Secondary | ICD-10-CM | POA: Insufficient documentation

## 2016-05-11 DIAGNOSIS — Z823 Family history of stroke: Secondary | ICD-10-CM | POA: Diagnosis not present

## 2016-05-11 DIAGNOSIS — I429 Cardiomyopathy, unspecified: Secondary | ICD-10-CM | POA: Diagnosis not present

## 2016-05-11 DIAGNOSIS — G8929 Other chronic pain: Secondary | ICD-10-CM | POA: Insufficient documentation

## 2016-05-11 DIAGNOSIS — Z7952 Long term (current) use of systemic steroids: Secondary | ICD-10-CM | POA: Insufficient documentation

## 2016-05-11 DIAGNOSIS — I110 Hypertensive heart disease with heart failure: Secondary | ICD-10-CM | POA: Diagnosis not present

## 2016-05-11 DIAGNOSIS — K219 Gastro-esophageal reflux disease without esophagitis: Secondary | ICD-10-CM | POA: Insufficient documentation

## 2016-05-11 DIAGNOSIS — Z7982 Long term (current) use of aspirin: Secondary | ICD-10-CM | POA: Diagnosis not present

## 2016-05-11 DIAGNOSIS — M109 Gout, unspecified: Secondary | ICD-10-CM | POA: Diagnosis not present

## 2016-05-11 DIAGNOSIS — R0602 Shortness of breath: Secondary | ICD-10-CM | POA: Diagnosis present

## 2016-05-11 HISTORY — PX: CARDIAC CATHETERIZATION: SHX172

## 2016-05-11 LAB — POCT I-STAT 3, ART BLOOD GAS (G3+)
Bicarbonate: 25.9 mEq/L — ABNORMAL HIGH (ref 20.0–24.0)
O2 SAT: 92 %
PCO2 ART: 43.7 mmHg (ref 35.0–45.0)
TCO2: 27 mmol/L (ref 0–100)
pH, Arterial: 7.381 (ref 7.350–7.450)
pO2, Arterial: 65 mmHg — ABNORMAL LOW (ref 80.0–100.0)

## 2016-05-11 LAB — POCT I-STAT 3, VENOUS BLOOD GAS (G3P V)
Bicarbonate: 25.7 mEq/L — ABNORMAL HIGH (ref 20.0–24.0)
O2 Saturation: 67 %
PCO2 VEN: 45.1 mmHg (ref 45.0–50.0)
PH VEN: 7.363 — AB (ref 7.250–7.300)
TCO2: 27 mmol/L (ref 0–100)
pO2, Ven: 37 mmHg (ref 31.0–45.0)

## 2016-05-11 SURGERY — RIGHT/LEFT HEART CATH AND CORONARY ANGIOGRAPHY
Anesthesia: LOCAL

## 2016-05-11 MED ORDER — METOPROLOL SUCCINATE ER 25 MG PO TB24
25.0000 mg | ORAL_TABLET | ORAL | Status: AC
  Administered 2016-05-11: 25 mg via ORAL
  Filled 2016-05-11: qty 1

## 2016-05-11 MED ORDER — SODIUM CHLORIDE 0.9 % IV SOLN: 250.0000 mL | INTRAVENOUS | Status: DC | PRN

## 2016-05-11 MED ORDER — SODIUM CHLORIDE 0.9% FLUSH: 3.0000 mL | INTRAVENOUS | Status: DC | PRN

## 2016-05-11 MED ORDER — HYDRALAZINE HCL 20 MG/ML IJ SOLN
10.0000 mg | Freq: Once | INTRAMUSCULAR | Status: AC
  Administered 2016-05-11: 10 mg via INTRAVENOUS

## 2016-05-11 MED ORDER — VERAPAMIL HCL 2.5 MG/ML IV SOLN
INTRAVENOUS | Status: AC
  Filled 2016-05-11: qty 2

## 2016-05-11 MED ORDER — ASPIRIN 81 MG PO CHEW
81.0000 mg | CHEWABLE_TABLET | ORAL | Status: AC
Start: 2016-05-12 — End: 2016-05-11
  Administered 2016-05-11: 81 mg via ORAL

## 2016-05-11 MED ORDER — HYDROCODONE-ACETAMINOPHEN 5-325 MG PO TABS
ORAL_TABLET | ORAL | Status: AC
  Filled 2016-05-11: qty 1

## 2016-05-11 MED ORDER — HYDRALAZINE HCL 20 MG/ML IJ SOLN
INTRAMUSCULAR | Status: AC
  Filled 2016-05-11: qty 1

## 2016-05-11 MED ORDER — MIDAZOLAM HCL 2 MG/2ML IJ SOLN
INTRAMUSCULAR | Status: AC
  Filled 2016-05-11: qty 2

## 2016-05-11 MED ORDER — SODIUM CHLORIDE 0.9 % WEIGHT BASED INFUSION: 1.0000 mL/kg/h | INTRAVENOUS | Status: AC

## 2016-05-11 MED ORDER — HYDROCODONE-ACETAMINOPHEN 5-325 MG PO TABS
1.0000 | ORAL_TABLET | Freq: Once | ORAL | Status: AC
  Administered 2016-05-11: 1 via ORAL

## 2016-05-11 MED ORDER — IOPAMIDOL (ISOVUE-370) INJECTION 76%
INTRAVENOUS | Status: AC
  Filled 2016-05-11: qty 100

## 2016-05-11 MED ORDER — LOSARTAN POTASSIUM 50 MG PO TABS
100.0000 mg | ORAL_TABLET | ORAL | Status: AC
  Administered 2016-05-11: 100 mg via ORAL
  Filled 2016-05-11: qty 2

## 2016-05-11 MED ORDER — SODIUM CHLORIDE 0.9% FLUSH: 3.0000 mL | Freq: Two times a day (BID) | INTRAVENOUS | Status: DC

## 2016-05-11 MED ORDER — VERAPAMIL HCL 2.5 MG/ML IV SOLN
INTRAVENOUS | Status: DC | PRN
  Administered 2016-05-11: 10 mL via INTRA_ARTERIAL

## 2016-05-11 MED ORDER — FENTANYL CITRATE (PF) 100 MCG/2ML IJ SOLN
INTRAMUSCULAR | Status: AC
  Filled 2016-05-11: qty 2

## 2016-05-11 MED ORDER — ASPIRIN 81 MG PO CHEW
CHEWABLE_TABLET | ORAL | Status: AC
  Administered 2016-05-11: 81 mg via ORAL
  Filled 2016-05-11: qty 1

## 2016-05-11 MED ORDER — SODIUM CHLORIDE 0.9 % IV SOLN
INTRAVENOUS | Status: DC
  Administered 2016-05-11: 09:00:00 via INTRAVENOUS

## 2016-05-11 MED ORDER — MIDAZOLAM HCL 2 MG/2ML IJ SOLN
INTRAMUSCULAR | Status: DC | PRN
  Administered 2016-05-11: 1 mg via INTRAVENOUS
  Administered 2016-05-11: 2 mg via INTRAVENOUS

## 2016-05-11 MED ORDER — LIDOCAINE HCL (PF) 1 % IJ SOLN
INTRAMUSCULAR | Status: AC
  Filled 2016-05-11: qty 30

## 2016-05-11 MED ORDER — IOPAMIDOL (ISOVUE-370) INJECTION 76%
INTRAVENOUS | Status: DC | PRN
  Administered 2016-05-11: 60 mL via INTRA_ARTERIAL

## 2016-05-11 MED ORDER — LIDOCAINE HCL (PF) 1 % IJ SOLN
INTRAMUSCULAR | Status: DC | PRN
  Administered 2016-05-11 (×2): 20 mL

## 2016-05-11 MED ORDER — HEPARIN (PORCINE) IN NACL 2-0.9 UNIT/ML-% IJ SOLN
INTRAMUSCULAR | Status: DC | PRN
  Administered 2016-05-11: 1000 mL

## 2016-05-11 MED ORDER — FENTANYL CITRATE (PF) 100 MCG/2ML IJ SOLN
INTRAMUSCULAR | Status: DC | PRN
  Administered 2016-05-11 (×2): 25 ug via INTRAVENOUS

## 2016-05-11 MED ORDER — HEPARIN (PORCINE) IN NACL 2-0.9 UNIT/ML-% IJ SOLN
INTRAMUSCULAR | Status: AC
  Filled 2016-05-11: qty 1000

## 2016-05-11 MED ORDER — HEPARIN SODIUM (PORCINE) 1000 UNIT/ML IJ SOLN
INTRAMUSCULAR | Status: AC
  Filled 2016-05-11: qty 1

## 2016-05-11 SURGICAL SUPPLY — 17 items
CATH INFINITI 5FR ANG PIGTAIL (CATHETERS) ×2 IMPLANT
CATH INFINITI 5FR JL4 (CATHETERS) ×2 IMPLANT
CATH INFINITI JR4 5F (CATHETERS) ×2 IMPLANT
CATH SWAN GANZ 7F STRAIGHT (CATHETERS) ×2 IMPLANT
DEVICE RAD COMP TR BAND LRG (VASCULAR PRODUCTS) ×2 IMPLANT
GLIDESHEATH SLEND SS 6F .021 (SHEATH) ×2 IMPLANT
KIT HEART LEFT (KITS) ×2 IMPLANT
PACK CARDIAC CATHETERIZATION (CUSTOM PROCEDURE TRAY) ×2 IMPLANT
SHEATH FAST CATH BRACH 5F 5CM (SHEATH) ×2 IMPLANT
SHEATH PINNACLE 5F 10CM (SHEATH) ×2 IMPLANT
SHEATH PINNACLE 7F 10CM (SHEATH) ×2 IMPLANT
SYR MEDRAD MARK V 150ML (SYRINGE) ×2 IMPLANT
TRANSDUCER W/STOPCOCK (MISCELLANEOUS) ×2 IMPLANT
TUBING CIL FLEX 10 FLL-RA (TUBING) ×2 IMPLANT
WIRE EMERALD 3MM-J .035X150CM (WIRE) ×2 IMPLANT
WIRE HI TORQ VERSACORE-J 145CM (WIRE) ×4 IMPLANT
WIRE SAFE-T 1.5MM-J .035X260CM (WIRE) ×2 IMPLANT

## 2016-05-11 NOTE — Discharge Instructions (Signed)
Radial Site Care °Refer to this sheet in the next few weeks. These instructions provide you with information about caring for yourself after your procedure. Your health care provider may also give you more specific instructions. Your treatment has been planned according to current medical practices, but problems sometimes occur. Call your health care provider if you have any problems or questions after your procedure. °WHAT TO EXPECT AFTER THE PROCEDURE °After your procedure, it is typical to have the following: °· Bruising at the radial site that usually fades within 1-2 weeks. °· Blood collecting in the tissue (hematoma) that may be painful to the touch. It should usually decrease in size and tenderness within 1-2 weeks. °HOME CARE INSTRUCTIONS °· Take medicines only as directed by your health care provider. °· You may shower 24-48 hours after the procedure or as directed by your health care provider. Remove the bandage (dressing) and gently wash the site with plain soap and water. Pat the area dry with a clean towel. Do not rub the site, because this may cause bleeding. °· Do not take baths, swim, or use a hot tub until your health care provider approves. °· Check your insertion site every day for redness, swelling, or drainage. °· Do not apply powder or lotion to the site. °· Do not flex or bend the affected arm for 24 hours or as directed by your health care provider. °· Do not push or pull heavy objects with the affected arm for 24 hours or as directed by your health care provider. °· Do not lift over 10 lb (4.5 kg) for 5 days after your procedure or as directed by your health care provider. °· Ask your health care provider when it is okay to: °¨ Return to work or school. °¨ Resume usual physical activities or sports. °¨ Resume sexual activity. °· Do not drive home if you are discharged the same day as the procedure. Have someone else drive you. °· You may drive 24 hours after the procedure unless otherwise  instructed by your health care provider. °· Do not operate machinery or power tools for 24 hours after the procedure. °· If your procedure was done as an outpatient procedure, which means that you went home the same day as your procedure, a responsible adult should be with you for the first 24 hours after you arrive home. °· Keep all follow-up visits as directed by your health care provider. This is important. °SEEK MEDICAL CARE IF: °· You have a fever. °· You have chills. °· You have increased bleeding from the radial site. Hold pressure on the site. °SEEK IMMEDIATE MEDICAL CARE IF: °· You have unusual pain at the radial site. °· You have redness, warmth, or swelling at the radial site. °· You have drainage (other than a small amount of blood on the dressing) from the radial site. °· The radial site is bleeding, and the bleeding does not stop after 30 minutes of holding steady pressure on the site. °· Your arm or hand becomes pale, cool, tingly, or numb. °  °This information is not intended to replace advice given to you by your health care provider. Make sure you discuss any questions you have with your health care provider. °  °Document Released: 11/19/2010 Document Revised: 11/07/2014 Document Reviewed: 05/05/2014 °Elsevier Interactive Patient Education ©2016 [employer] °Angiogram, Care After °These instructions give you information about caring for yourself after your procedure. Your doctor may also give you more specific instructions. Call your doctor if you   have any problems or questions after your procedure.  °HOME CARE °· Take medicines only as told by your doctor. °· Follow your doctor's instructions about: °· Care of the area where the tube was inserted. °· Bandage (dressing) changes and removal. °· You may shower 24-48 hours after the procedure or as told by your doctor. °· Do not take baths, swim, or use a hot tub until your doctor approves. °· Every day, check the area where the tube was inserted.  Watch for: °· Redness, swelling, or pain. °· Fluid, blood, or pus. °· Do not apply powder or lotion to the site. °· Do not lift anything that is heavier than 10 lb (4.5 kg) for 5 days or as told by your doctor. °· Ask your doctor when you can: °· Return to work or school. °· Do physical activities or play sports. °· Have sex. °· Do not drive or operate heavy machinery for 24 hours or as told by your doctor. °· Have someone with you for the first 24 hours after the procedure. °· Keep all follow-up visits as told by your doctor. This is important. °GET HELP IF: °· You have a fever.   °· You have chills.   °· You have more bleeding from the area where the tube was inserted. Hold pressure on the area. °· You have redness, swelling, or pain in the area where the tube was inserted. °· You have fluid or pus coming from the area. °GET HELP RIGHT AWAY IF:  °· You have a lot of pain in the area where the tube was inserted. °· The area where the tube was inserted is bleeding, and the bleeding does not stop after 30 minutes of holding steady pressure on the area. °· The area near or just beyond the insertion site becomes pale, cool, tingly, or numb. °  °This information is not intended to replace advice given to you by your health care provider. Make sure you discuss any questions you have with your health care provider. °  °Document Released: 01/13/2009 Document Revised: 11/07/2014 Document Reviewed: 03/20/2013 °Elsevier Interactive Patient Education ©2016 Elsevier Inc. ° °

## 2016-05-11 NOTE — Interval H&P Note (Signed)
History and Physical Interval Note:  05/11/2016 10:49 AM  Maudry Mayhew  has presented today for surgery, with the diagnosis of CHF  The various methods of treatment have been discussed with the patient and family. After consideration of risks, benefits and other options for treatment, the patient has consented to  Procedure(s): Right/Left Heart Cath and Coronary Angiography (N/A) as a surgical intervention .  The patient's history has been reviewed, patient examined, no change in status, stable for surgery.  I have reviewed the patient's chart and labs.  Questions were answered to the patient's satisfaction.   Cath Lab Visit (complete for each Cath Lab visit)  Clinical Evaluation Leading to the Procedure:   ACS: No.  Non-ACS:    Anginal Classification: CCS II  Anti-ischemic medical therapy: Minimal Therapy (1 class of medications)  Non-Invasive Test Results: No non-invasive testing performed  Prior CABG: No previous CABG        Collier Salina [facility] 05/11/2016 10:49 AM

## 2016-05-11 NOTE — Progress Notes (Signed)
Site area: Right groin a 7 french venous and 5 french arterial sheath was removed  Site Prior to Removal:  Level 0  Pressure Applied For 15 MINUTES    Bedrest Beginning at 1350p  Manual:   Yes.    Patient Status During Pull:  stable  Post Pull Groin Site:  Level 0  Post Pull Instructions Given:  Yes.    Post Pull Pulses Present:  Yes.    Dressing Applied:  Yes.    Comments:  BP 167/72, HR 78, BP meds effective.

## 2016-05-11 NOTE — H&P (View-Only) (Signed)
Cardiology Office Note   Date:  04/13/2016   ID:  Donna Herrera, DOB [birth year], MRN [id number]  PCP:  Georges Lynch, MD  Cardiologist:   Dorris Carnes, MD   Pt referred for eval of SOB      History of Present Illness: Donna Herrera is a 70 y.o. female with a history of cough  Pt treated with lasix   Echo in 2016 LVEF was 35 to 40%   RVEF normal PAP 49 mm Hg   Last month was bad  Couldn't talk/walk  Coughing constant    Pt denies having chest pain  Breathing is bettter now with no cough  Tr edema  No PND      Outpatient Prescriptions Prior to Visit  Medication Sig Dispense Refill  . albuterol (PROVENTIL HFA;VENTOLIN HFA) 108 (90 BASE) MCG/ACT inhaler Inhale 2 puffs into the lungs every 4 (four) hours as needed for wheezing or shortness of breath. 1 Inhaler 0  . aspirin 81 MG tablet Take 1 tablet (81 mg total) by mouth daily. 90 tablet 3  . cetirizine (ZYRTEC) 10 MG tablet TAKE 1 TABLET (10 MG TOTAL) BY MOUTH DAILY. 30 tablet 0  . colchicine 0.6 MG tablet TAKE 1 TABLET BY MOUTH 2 TIMES DAILY 62 tablet 5  . cyclobenzaprine (FLEXERIL) 10 MG tablet Take 1 tablet (10 mg total) by mouth 2 (two) times daily as needed for muscle spasms. 30 tablet 0  . furosemide (LASIX) 20 MG tablet TAKE 1 TABLET (20 MG TOTAL) BY MOUTH DAILY. 30 tablet 1  . HYDROcodone-acetaminophen (NORCO) 5-325 MG tablet Take 1 tablet by mouth 2 (two) times daily as needed. 60 tablet 0  . losartan (COZAAR) 100 MG tablet Take 1 tablet (100 mg total) by mouth daily. 90 tablet 3  . metoprolol succinate (TOPROL-XL) 25 MG 24 hr tablet TAKE 3 TABLETS BY MOUTH DAILY WITH OR IMMEDIATELY FOLLOWING A MEAL 90 tablet 5  . pantoprazole (PROTONIX) 20 MG tablet TAKE 2 TABLETS (40 MG TOTAL) BY MOUTH DAILY. 180 tablet 3  . potassium chloride SA (KLOR-CON M20) 20 MEQ tablet TAKE 1 TABLET (20 MEQ TOTAL) BY MOUTH DAILY. 30 tablet 1  . tiZANidine (ZANAFLEX) 2 MG tablet Take 1 tablet (2 mg total) by mouth every 6 (six) hours as needed for  muscle spasms. 30 tablet 1  . triamcinolone ointment (KENALOG) 0.5 % Apply 1 application topically 2 (two) times daily. To palms 30 g 3  . dextromethorphan-guaiFENesin (MUCINEX DM) 30-600 MG 12hr tablet Take 2 tablets by mouth 2 (two) times daily. Take for 5 days then stop. (Patient not taking: Reported on 04/13/2016) 20 tablet 0  . predniSONE (DELTASONE) 50 MG tablet Take 1 tablet (50 mg total) by mouth daily with breakfast. (Patient not taking: Reported on 04/13/2016) 5 tablet 0   No facility-administered medications prior to visit.     Allergies:   Morphine and related   Past Medical History  Diagnosis Date  . Allergy   . Arthritis   . Hypertension   . GERD (gastroesophageal reflux disease)   . Chronic pain   . Hyperlipidemia   . Gout   . H/O slipped capital femoral epiphysis (SCFE)     Past Surgical History  Procedure Laterality Date  . Cesarean section    . Tubal ligation    . Total hip arthroplasty  1987    right. Dr. Collier Salina  . Joint replacement  [facility] [facility]  . Hip fusion  left   . Rotator cuff repair      bilateral  . Cardiac catheterization  2005    normal-[place]     Social History:  The patient  reports that she has been smoking.  She does not have any smokeless tobacco history on file. She reports that she drinks alcohol. She reports that she does not use illicit drugs.   Family History:  The patient's family history includes Cancer in her father; Depression in her mother; Diabetes in her mother and sister; Heart disease in her mother; Hypertension in her mother; Stroke in her mother.    ROS:  Please see the history of present illness. All other systems are reviewed and  Negative to the above problem except as noted.    PHYSICAL EXAM: VS:  BP 138/68 mmHg  Pulse 66  Ht 5\' 7"  (1.702 m)  Wt 185 lb 12.8 oz (84.278 kg)  BMI 29.09 kg/m2  SpO2 97%  GEN: Well nourished, well developed, in no acute distress HEENT: normal Neck: no JVD,  carotid bruits, or masses Cardiac: RRR; no murmurs, rubs, or gallops,  Trace edema  Respiratory:  clear to auscultation bilaterally, normal work of breathing GI: soft, nontender, nondistended, + BS  No hepatomegaly  MS: no deformity Moving all extremities   Skin: warm and dry, no rash Neuro:  Strength and sensation are intact Psych: euthymic mood, full affect   EKG:  EKG is not ordered today.   Lipid Panel    Component Value Date/Time   CHOL 177 10/04/2015 0141   TRIG 78 10/04/2015 0141   HDL 50 10/04/2015 0141   CHOLHDL 3.5 10/04/2015 0141   VLDL 16 10/04/2015 0141   LDLCALC 111* 10/04/2015 0141      Wt Readings from Last 3 Encounters:  04/13/16 185 lb 12.8 oz (84.278 kg)  04/01/16 186 lb 6.4 oz (84.55 kg)  01/04/16 182 lb (82.555 kg)      ASSESSMENT AND PLAN:  1  Chronic systolic CHF  Volume status is not too bad  What has never been determined is cause for CHF   ? Ischemia  EKG from past does not show Q waves  I discussed with pt cath and stress testing (pro/con)  She is undecided  Would like to review with primary MD   Keep on same meds for now  WIll review labs      Signed, Dorris Carnes, MD  04/13/2016 3:39 PM    [facility] [address], [place], [place]  [address] Phone: [phone]; Fax: [phone]   May 09, 2016  Pt has spoken with primary MD (Dr Alease Frame)  She is agreeable to cath  Will plan on R and L heart cath at [facility].  Risk/benefits had been duscussed  Pt agrees to proceed.    Dorris Carnes

## 2016-05-12 ENCOUNTER — Encounter (HOSPITAL_COMMUNITY): Payer: Self-pay | Admitting: Cardiology

## 2016-05-12 MED FILL — Heparin Sodium (Porcine) Inj 1000 Unit/ML: INTRAMUSCULAR | Qty: 10 | Status: AC

## 2016-05-13 NOTE — Progress Notes (Signed)
Message sent to scheduling for patient to have f/u post cath in 2-3 weeks per Dr. Harrington Challenger.

## 2016-06-08 NOTE — Progress Notes (Signed)
Cardiology Office Note:    Date:  06/09/2016   ID:  Donna Herrera, DOB Jul 27, 1946, MRN [id number]  PCP:  Georges Lynch, MD  Cardiologist:  Dr. Dorris Carnes   Electrophysiologist:  n/a  Referring MD: Elberta Leatherwood, MD   Chief Complaint  Patient presents with  . Follow-up    CHF; s/p cardiac cath   History of Present Illness:    Donna Herrera is a 70 y.o. female with a hx of DCM with EF 123456, systolic HF, tobacco abuse, HTN, HL.  She was admitted with respiratory failure in the setting of CAP and combined systolic and diastolic CHF.  She was evaluated by Dr. Dorris Carnes in 6/17 and set up to have a cardiac cath.  This was done 05/11/16 and demonstrated no CAD. She returns for FU.    She is here alone. She has significant arthritis in her hip and knees. She had a right THR in the 1980s and tells me that the prosthesis is coming out. Her mobility is somewhat limited. However, she denies significant dyspnea with activities. She denies chest discomfort. She denies syncope. She denies PND. LE edema is improved. She sleeps on 3 pillows chronically. She denies any bleeding issues.  Prior CV studies that were reviewed today include:    LHC 05/11/16 1. Normal coronary anatomy 2. Severe LV dysfunction with EF 30-35%. 3. Normal right heart and LV filling pressures. LV EDP 14 mmHg  Plan: continue medical therapy.  Echo 10/05/15 Mild LVH, EF 35-40%, diffuse HK, grade 2 diastolic dysfunction, MAC, mild MR, moderate LAE, normal RVSF, PASP 49 mmHg  Echo 01/16/15 EF 40%, diffuse HK, mild MR, moderate LAE, mild RAE, PASP 42 mmHg   Past Medical History:  Diagnosis Date  . Allergy   . Arthritis   . Chronic combined systolic and diastolic CHF (congestive heart failure) ([place]) 10/21/2015   A. Echo 3/16: EF 40%, diffuse HK, mild MR, moderate LAE, mild RAE, PASP 42 mmHg  //  B. Echo 12/16: Mild LVH, EF 35-40%, diffuse HK, grade 2 diastolic dysfunction, MAC, mild MR, moderate LAE, normal RVSF, PASP 49  mmHg  . Chronic pain   . GERD (gastroesophageal reflux disease)   . Gout   . H/O slipped capital femoral epiphysis (SCFE)   . Hyperlipidemia   . Hypertension   . Hypertensive heart disease with CHF (congestive heart failure) ([place]) 10/12/2012  . NICM (nonischemic cardiomyopathy) ([place]) 06/09/2016   A. LHC 7/17: Normal coronary arteries, EF 30-35%, LVEDP 14 mmHg    Past Surgical History:  Procedure Laterality Date  . CARDIAC CATHETERIZATION  2005   normal-Hochrein  . CARDIAC CATHETERIZATION N/A 05/11/2016   Procedure: Right/Left Heart Cath and Coronary Angiography;  Surgeon: Peter M Martinique, MD;  Location: [facility];  Service: Cardiovascular;  Laterality: N/A;  . CESAREAN SECTION    . HIP FUSION     left   . [place]  . ROTATOR CUFF REPAIR     bilateral  . TOTAL HIP ARTHROPLASTY  1987   right. Dr. Collier Salina  . TUBAL LIGATION      Current Medications: Outpatient Medications Prior to Visit  Medication Sig Dispense Refill  . aspirin 81 MG tablet Take 1 tablet (81 mg total) by mouth daily. 90 tablet 3  . cetirizine (ZYRTEC) 10 MG tablet TAKE 1 TABLET (10 MG TOTAL) BY MOUTH DAILY. 30 tablet 0  . colchicine 0.6 MG tablet TAKE 1 TABLET BY  MOUTH 2 TIMES DAILY 62 tablet 5  . cyclobenzaprine (FLEXERIL) 10 MG tablet Take 1 tablet (10 mg total) by mouth 2 (two) times daily as needed for muscle spasms. 30 tablet 0  . furosemide (LASIX) 20 MG tablet TAKE 1 TABLET (20 MG TOTAL) BY MOUTH DAILY. 90 tablet 3  . losartan (COZAAR) 100 MG tablet Take 1 tablet (100 mg total) by mouth daily. 90 tablet 3  . pantoprazole (PROTONIX) 20 MG tablet TAKE 2 TABLETS (40 MG TOTAL) BY MOUTH DAILY. 180 tablet 3  . potassium chloride SA (KLOR-CON M20) 20 MEQ tablet TAKE 1 TABLET (20 MEQ TOTAL) BY MOUTH DAILY. 30 tablet 1  . tiZANidine (ZANAFLEX) 2 MG tablet Take 1 tablet (2 mg total) by mouth every 6 (six) hours as needed for muscle spasms. 30 tablet 1  . triamcinolone  ointment (KENALOG) 0.5 % Apply 1 application topically 2 (two) times daily. To palms 30 g 3  . metoprolol succinate (TOPROL-XL) 25 MG 24 hr tablet TAKE 3 TABLETS BY MOUTH DAILY WITH OR IMMEDIATELY FOLLOWING A MEAL 90 tablet 5  . albuterol (PROVENTIL HFA;VENTOLIN HFA) 108 (90 BASE) MCG/ACT inhaler Inhale 2 puffs into the lungs every 4 (four) hours as needed for wheezing or shortness of breath. (Patient not taking: Reported on 06/09/2016) 1 Inhaler 0  . HYDROcodone-acetaminophen (NORCO) 5-325 MG tablet Take 1 tablet by mouth 2 (two) times daily as needed. (Patient not taking: Reported on 06/09/2016) 60 tablet 0   No facility-administered medications prior to visit.       Allergies:   Crab [shellfish allergy] and Morphine and related   Social History   Social History  . Marital status: Married    Spouse name: N/A  . Number of children: N/A  . Years of education: N/A   Social History Main Topics  . Smoking status: Current Every Day Smoker    Packs/day: 0.50  . Smokeless tobacco: Current User     Comment: decreased smoking/1 pack every 3 days  . Alcohol use Yes     Comment: on holidays  . Drug use: No  . Sexual activity: Not Asked   Other Topics Concern  . 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     Family History:  The patient's family history includes Cancer in her father; Depression in her mother; Diabetes in her mother and sister; Heart disease in her mother; Hypertension in her mother; Stroke in her mother.   ROS:   Please see the history of present illness.    ROS All other systems reviewed and are negative.   EKGs/Labs/Other Test Reviewed:    EKG:  EKG is  ordered today.  The ekg ordered today demonstrates NSR, HR 68, LAD, nonspecific ST-T wave changes, QTc 469 ms  Recent Labs: 10/04/2015: Magnesium 2.1; TSH 0.582 10/05/2015: B Natriuretic Peptide 1,015.2 10/21/2015: ALT 11 05/09/2016: BUN 12; Creat  0.64; Hemoglobin 13.8; Platelets 244; Potassium 4.2; Sodium 140   Recent Lipid Panel    Component Value Date/Time   CHOL 177 10/04/2015 0141   TRIG 78 10/04/2015 0141   HDL 50 10/04/2015 0141   CHOLHDL 3.5 10/04/2015 0141   VLDL 16 10/04/2015 0141   LDLCALC 111 (H) 10/04/2015 0141     Physical Exam:    VS:  BP (!) 180/100   Pulse 68   Ht 5\' 7"  (1.702 m)   Wt 181 lb 6.4 oz (82.3 kg)   BMI 28.41  kg/m     Wt Readings from Last 3 Encounters:  06/09/16 181 lb 6.4 oz (82.3 kg)  05/11/16 183 lb (83 kg)  05/04/16 186 lb (84.4 kg)     Physical Exam  Constitutional: She is oriented to person, place, and time. She appears well-developed and well-nourished. No distress.  HENT:  Head: Normocephalic and atraumatic.  Eyes: No scleral icterus.  Neck: Normal range of motion. No JVD present.  Cardiovascular: Normal rate, regular rhythm, S1 normal and S2 normal.  Exam reveals no gallop and no friction rub.   No murmur heard. Pulmonary/Chest: Breath sounds normal. She has no wheezes. She has no rhonchi. She has no rales.  Abdominal: Soft. There is no tenderness.  Musculoskeletal: She exhibits edema.  Trace bilateral LE edema R groin without hematoma or bruit    Neurological: She is alert and oriented to person, place, and time.  Skin: Skin is warm and dry.  Psychiatric: She has a normal mood and affect.    ASSESSMENT:    1. Chronic combined systolic and diastolic CHF (congestive heart failure) ([place])   2. NICM (nonischemic cardiomyopathy) ([place])   3. Hypertensive heart disease with CHF (congestive heart failure) ([place])    PLAN:    In order of problems listed above:  1. Chronic combined systolic and diastolic CHF - Her volume is stable. She is NYHA 2-2b. Continue losartan.    -  DC Metoprolol Succinate   -  Start Carvedilol 6.25 mg twice a day  -  Consider adding spironolactone, hydralazine, nitrates over time  2. Nonischemic cardiomyopathy - Recent LHC with normal coronary  arteries.  Continue beta blocker, ARB. Eventually repeat echocardiogram to reassess LV function.  3. HTN  - Blood pressure uncontrolled. She has not taken any medications yet today. Change metoprolol to carvedilol as noted. Follow-up in 3 weeks for further titration of CHF medications and blood pressure management.   Medication Adjustments/Labs and Tests Ordered: Current medicines are reviewed at length with the patient today.  Concerns regarding medicines are outlined above.  Medication changes, Labs and Tests ordered today are outlined in the Patient Instructions noted below. Patient Instructions  Medication Instructions:  STOP taking Metoprolol Succinate (Toprol-XL) START taking Carvedilol 6.25 mg Twice daily - I sent a prescription to your pharmacy.   Labwork: None today  Testing/Procedures: None today   Follow-Up: Dr. Dorris Carnes or [name], PA-C in 3 weeks.   Any Other Special Instructions Will Be Listed Below (If Applicable).  If you need a refill on your cardiac medications before your next appointment, please call your pharmacy.   Signed, [name], PA-C  06/09/2016 1:09 PM    [facility] [address], [place], [place]  [address] Phone: [phone]; Fax: [phone]

## 2016-06-09 ENCOUNTER — Encounter (INDEPENDENT_AMBULATORY_CARE_PROVIDER_SITE_OTHER): Payer: Self-pay

## 2016-06-09 ENCOUNTER — Other Ambulatory Visit: Payer: Self-pay | Admitting: Family Medicine

## 2016-06-09 ENCOUNTER — Ambulatory Visit (INDEPENDENT_AMBULATORY_CARE_PROVIDER_SITE_OTHER): Payer: Medicare Other | Admitting: Physician Assistant

## 2016-06-09 ENCOUNTER — Encounter: Payer: Self-pay | Admitting: Physician Assistant

## 2016-06-09 VITALS — BP 180/100 | HR 68 | Ht 67.0 in | Wt 181.4 lb

## 2016-06-09 DIAGNOSIS — I429 Cardiomyopathy, unspecified: Secondary | ICD-10-CM | POA: Diagnosis not present

## 2016-06-09 DIAGNOSIS — I5042 Chronic combined systolic (congestive) and diastolic (congestive) heart failure: Secondary | ICD-10-CM

## 2016-06-09 DIAGNOSIS — I110 Hypertensive heart disease with heart failure: Secondary | ICD-10-CM | POA: Diagnosis not present

## 2016-06-09 DIAGNOSIS — I428 Other cardiomyopathies: Secondary | ICD-10-CM

## 2016-06-09 HISTORY — DX: Other cardiomyopathies: I42.8

## 2016-06-09 MED ORDER — CARVEDILOL 6.25 MG PO TABS: 6.2500 mg | ORAL_TABLET | Freq: Two times a day (BID) | ORAL | 11 refills | Status: DC

## 2016-06-09 NOTE — Telephone Encounter (Signed)
Needs refill on hydrocodone.  Please call when ready for pickup °

## 2016-06-09 NOTE — Patient Instructions (Signed)
Medication Instructions:  STOP taking Metoprolol Succinate (Toprol-XL) START taking Carvedilol 6.25 mg Twice daily - I sent a prescription to your pharmacy.   Labwork: None today  Testing/Procedures: None today   Follow-Up: Dr. Dorris Carnes or [name], PA-C in 3 weeks.   Any Other Special Instructions Will Be Listed Below (If Applicable).  If you need a refill on your cardiac medications before your next appointment, please call your pharmacy.

## 2016-06-13 ENCOUNTER — Ambulatory Visit: Payer: Medicare Other | Admitting: Podiatry

## 2016-06-14 NOTE — Telephone Encounter (Signed)
Pt would like a refill on hydrocodone. Please advise. Thanks! ep

## 2016-06-14 NOTE — Telephone Encounter (Signed)
Will forward to PCP.  Martin, Tamika L, RN  

## 2016-06-15 MED ORDER — HYDROCODONE-ACETAMINOPHEN 5-325 MG PO TABS: 1.0000 | ORAL_TABLET | Freq: Two times a day (BID) | ORAL | 0 refills | Status: DC | PRN

## 2016-06-15 NOTE — Telephone Encounter (Signed)
Refill will be left up front later this afternoon once i'm in clinic

## 2016-07-04 NOTE — Progress Notes (Signed)
Cardiology Office Note:    Date:  07/05/2016   ID:  Donna Herrera, DOB [birth year], MRN [id number]  PCP:  Georges Lynch, MD  Cardiologist:  Dr. Dorris Carnes   Electrophysiologist:  n/a  Referring MD: Elberta Leatherwood, MD   Chief Complaint  Patient presents with  . Follow-up    CHF    History of Present Illness:    Donna Herrera is a 70 y.o. female with a hx of DCM with EF 123456, systolic HF, tobacco abuse, HTN, HL.  LHC in 7/17 demonstrated no CAD.  I saw her 06/09/16.  I changed her Metoprolol Succinate to Carvedilol. She returns for FU.  As noted previously, she has significant arthritis which limits mobility.  Overall she feels her breathing is improved.  She denies chest pain, syncope, orthopnea, PND.  She denies LE edema.  She continues to smoke. She has a chronic cough.    Prior CV studies that were reviewed today include:    LHC 05/11/16 1. Normal coronary anatomy 2. Severe LV dysfunction with EF 30-35%. 3. Normal right heart and LV filling pressures.  Echo 10/05/15 Mild LVH, EF 35-40%, diffuse HK, grade 2 diastolic dysfunction, MAC, mild MR, moderate LAE, normal RVSF, PASP 49 mmHg  Echo 01/16/15 EF 40%, diffuse HK, mild MR, moderate LAE, mild RAE, PASP 42 mmHg   Past Medical History:  Diagnosis Date  . Allergy   . Arthritis   . Chronic combined systolic and diastolic CHF (congestive heart failure) ([place]) 10/21/2015   A. Echo 3/16: EF 40%, diffuse HK, mild MR, moderate LAE, mild RAE, PASP 42 mmHg  //  B. Echo 12/16: Mild LVH, EF 35-40%, diffuse HK, grade 2 diastolic dysfunction, MAC, mild MR, moderate LAE, normal RVSF, PASP 49 mmHg  . Chronic pain   . GERD (gastroesophageal reflux disease)   . Gout   . H/O slipped capital femoral epiphysis (SCFE)   . Hyperlipidemia   . Hypertension   . Hypertensive heart disease with CHF (congestive heart failure) ([place]) 10/12/2012  . NICM (nonischemic cardiomyopathy) ([place]) 06/09/2016   A. LHC 7/17: Normal coronary arteries, EF  30-35%, LVEDP 14 mmHg    Past Surgical History:  Procedure Laterality Date  . CARDIAC CATHETERIZATION  2005   normal-Hochrein  . CARDIAC CATHETERIZATION N/A 05/11/2016   Procedure: Right/Left Heart Cath and Coronary Angiography;  Surgeon: Peter M Martinique, MD;  Location: [facility];  Service: Cardiovascular;  Laterality: N/A;  . CESAREAN SECTION    . HIP FUSION     left   . [place]  . ROTATOR CUFF REPAIR     bilateral  . TOTAL HIP ARTHROPLASTY  1987   right. Dr. Collier Salina  . TUBAL LIGATION      Current Medications: Outpatient Medications Prior to Visit  Medication Sig Dispense Refill  . aspirin 81 MG tablet Take 1 tablet (81 mg total) by mouth daily. 90 tablet 3  . carvedilol (COREG) 6.25 MG tablet Take 1 tablet (6.25 mg total) by mouth 2 (two) times daily. 60 tablet 11  . cetirizine (ZYRTEC) 10 MG tablet TAKE 1 TABLET (10 MG TOTAL) BY MOUTH DAILY. 30 tablet 0  . colchicine 0.6 MG tablet TAKE 1 TABLET BY MOUTH 2 TIMES DAILY 62 tablet 5  . cyclobenzaprine (FLEXERIL) 10 MG tablet Take 1 tablet (10 mg total) by mouth 2 (two) times daily as needed for muscle spasms. 30 tablet 0  . furosemide (LASIX) 20 MG  tablet TAKE 1 TABLET (20 MG TOTAL) BY MOUTH DAILY. 90 tablet 3  . HYDROcodone-acetaminophen (NORCO/VICODIN) 5-325 MG tablet Take 1 tablet by mouth 2 (two) times daily as needed for moderate pain. 30 tablet 0  . losartan (COZAAR) 100 MG tablet Take 1 tablet (100 mg total) by mouth daily. 90 tablet 3  . pantoprazole (PROTONIX) 20 MG tablet TAKE 2 TABLETS (40 MG TOTAL) BY MOUTH DAILY. 180 tablet 3  . tiZANidine (ZANAFLEX) 2 MG tablet Take 1 tablet (2 mg total) by mouth every 6 (six) hours as needed for muscle spasms. 30 tablet 1  . triamcinolone ointment (KENALOG) 0.5 % Apply 1 application topically 2 (two) times daily. To palms 30 g 3  . potassium chloride SA (KLOR-CON M20) 20 MEQ tablet TAKE 1 TABLET (20 MEQ TOTAL) BY MOUTH DAILY. 30 tablet 1    No facility-administered medications prior to visit.       Allergies:   Crab [shellfish allergy] and Morphine and related   Social History   Social History  . Marital status: Married    Spouse name: N/A  . Number of children: N/A  . Years of education: N/A   Social History Main Topics  . Smoking status: Current Every Day Smoker    Packs/day: 0.50  . Smokeless tobacco: Current User     Comment: decreased smoking/1 pack every 3 days  . Alcohol use Yes     Comment: on holidays  . Drug use: No  . Sexual activity: Not Asked   Other Topics Concern  . 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     Family History:  The patient's family history includes Cancer in her father; Depression in her mother; Diabetes in her mother and sister; Heart disease in her mother; Hypertension in her mother; Stroke in her mother.   ROS:   Please see the history of present illness.    ROS All other systems reviewed and are negative.   EKGs/Labs/Other Test Reviewed:    EKG:  EKG is  ordered today.  The ekg ordered today demonstrates NSR, HR 75, PVC, leftward axis, QTc 489 ms, no significant changes  Recent Labs: 10/04/2015: Magnesium 2.1; TSH 0.582 10/05/2015: B Natriuretic Peptide 1,015.2 10/21/2015: ALT 11 05/09/2016: BUN 12; Creat 0.64; Hemoglobin 13.8; Platelets 244; Potassium 4.2; Sodium 140   Recent Lipid Panel    Component Value Date/Time   CHOL 177 10/04/2015 0141   TRIG 78 10/04/2015 0141   HDL 50 10/04/2015 0141   CHOLHDL 3.5 10/04/2015 0141   VLDL 16 10/04/2015 0141   LDLCALC 111 (H) 10/04/2015 0141     Physical Exam:    VS:  BP (!) 150/80   Pulse 74   Ht 5\' 6"  (1.676 m)   Wt 181 lb 1.9 oz (82.2 kg)   BMI 29.23 kg/m     Wt Readings from Last 3 Encounters:  07/05/16 181 lb 1.9 oz (82.2 kg)  06/09/16 181 lb 6.4 oz (82.3 kg)  05/11/16 183 lb (83 kg)     Physical Exam  Constitutional: She is  oriented to person, place, and time. She appears well-developed and well-nourished. No distress.  HENT:  Head: Normocephalic and atraumatic.  Neck: No JVD present.  Cardiovascular: Normal rate, regular rhythm and normal heart sounds.   No murmur heard. Pulmonary/Chest: She has decreased breath sounds. She has no wheezes. She has no rhonchi. She has no rales.  Abdominal: Soft. There  is no tenderness.  Musculoskeletal: She exhibits no edema.  Neurological: She is alert and oriented to person, place, and time.  Skin: Skin is warm and dry.  Psychiatric: She has a normal mood and affect.    ASSESSMENT:    1. Chronic combined systolic and diastolic CHF (congestive heart failure) ([place])   2. NICM (nonischemic cardiomyopathy) ([place])   3. Hypertensive heart disease with CHF (congestive heart failure) ([place])    PLAN:    In order of problems listed above:  1. Chronic combined systolic and diastolic CHF -  Volume overall stable. Continue Carvedilol, losartan.    -  Start Spironolactone 25 mg QD.  -  Stop K+  -  BMET 1 week and again 1 week later to keep a close eye on K+ and Creatinine with addition of Spironolactone.             -  Consider Entresto in FU.  2. Nonischemic cardiomyopathy - No CAD on cardiac cath.  No angina.    3. HTN  - BP improved.  Still not at target. Will add Spironolactone as noted.  Continue Losartan, Carvedilol.     Medication Adjustments/Labs and Tests Ordered: Current medicines are reviewed at length with the patient today.  Concerns regarding medicines are outlined above.  Medication changes, Labs and Tests ordered today are outlined in the Patient Instructions noted below. Patient Instructions  Medication Instructions:  STOP taking Potassium (also known as K-Dur or Klor-Con). START taking Spironolactone 25 mg Once daily   Labwork: In 1 week - BMET (you can get it drawn with your primary care doctor and have it sent to me) In 2 weeks - BMET (you can get it  drawn with your primary care doctor and have it sent to me)  Testing/Procedures: None   Follow-Up: [name], PA-C in 1 month, on a day Dr. Harrington Challenger is in the office if possible  Any Other Special Instructions Will Be Listed Below (If Applicable). If you need a refill on your cardiac medications before your next appointment, please call your pharmacy.   Signed, [name], PA-C  07/05/2016 12:51 PM    [facility] [place], [place], [place]  [address] Phone: [phone]; Fax: [phone]

## 2016-07-05 ENCOUNTER — Encounter: Payer: Self-pay | Admitting: Physician Assistant

## 2016-07-05 ENCOUNTER — Ambulatory Visit (INDEPENDENT_AMBULATORY_CARE_PROVIDER_SITE_OTHER): Payer: Medicare Other | Admitting: Physician Assistant

## 2016-07-05 ENCOUNTER — Encounter (INDEPENDENT_AMBULATORY_CARE_PROVIDER_SITE_OTHER): Payer: Self-pay

## 2016-07-05 VITALS — BP 150/80 | HR 74 | Ht 66.0 in | Wt 181.1 lb

## 2016-07-05 DIAGNOSIS — I110 Hypertensive heart disease with heart failure: Secondary | ICD-10-CM | POA: Diagnosis not present

## 2016-07-05 DIAGNOSIS — I5042 Chronic combined systolic (congestive) and diastolic (congestive) heart failure: Secondary | ICD-10-CM | POA: Diagnosis not present

## 2016-07-05 DIAGNOSIS — I429 Cardiomyopathy, unspecified: Secondary | ICD-10-CM

## 2016-07-05 DIAGNOSIS — I428 Other cardiomyopathies: Secondary | ICD-10-CM

## 2016-07-05 MED ORDER — SPIRONOLACTONE 25 MG PO TABS: 25.0000 mg | ORAL_TABLET | Freq: Every day | ORAL | 3 refills | Status: DC

## 2016-07-05 NOTE — Patient Instructions (Addendum)
Medication Instructions:  STOP taking Potassium (also known as K-Dur or Klor-Con). START taking Spironolactone 25 mg Once daily   Labwork: In 1 week - BMET (you can get it drawn with your primary care doctor and have it sent to me) In 2 weeks - BMET (you can get it drawn with your primary care doctor and have it sent to me)  Testing/Procedures: None   Follow-Up: [name], PA-C in 1 month, on a day Dr. Harrington Challenger is in the office if possible  Any Other Special Instructions Will Be Listed Below (If Applicable). If you need a refill on your cardiac medications before your next appointment, please call your pharmacy.

## 2016-07-07 ENCOUNTER — Telehealth: Payer: Self-pay | Admitting: Family Medicine

## 2016-07-07 ENCOUNTER — Other Ambulatory Visit: Payer: Self-pay | Admitting: Family Medicine

## 2016-07-07 DIAGNOSIS — Z79899 Other long term (current) drug therapy: Secondary | ICD-10-CM

## 2016-07-07 NOTE — Telephone Encounter (Signed)
Orders have been written.  Please ask that she come in next week Tues-Thurs. >> and AGAIN the following week during the same time frame.

## 2016-07-07 NOTE — Telephone Encounter (Signed)
Pt was told by her cardiologist to have blood work done her in one week.  It is in her after visit notes given to her. Dr Christin Fudge you be willing to order the blood work so she can have it done here?  If so please let her know she can make an appt to have this done.

## 2016-07-07 NOTE — Telephone Encounter (Signed)
Pt informed and scheduled for 2 lab appts. Deseree Kennon Holter, CMA

## 2016-07-14 ENCOUNTER — Other Ambulatory Visit: Payer: Self-pay | Admitting: Family Medicine

## 2016-07-14 ENCOUNTER — Other Ambulatory Visit: Payer: Medicare Other

## 2016-07-14 DIAGNOSIS — Z79899 Other long term (current) drug therapy: Secondary | ICD-10-CM

## 2016-07-14 NOTE — Telephone Encounter (Signed)
Patient asks PCP refill for hydrocodone asap. Please, follow up.

## 2016-07-15 ENCOUNTER — Other Ambulatory Visit: Payer: Self-pay | Admitting: Family Medicine

## 2016-07-15 LAB — BASIC METABOLIC PANEL WITH GFR
BUN: 9 mg/dL (ref 7–25)
CALCIUM: 9 mg/dL (ref 8.6–10.4)
CO2: 25 mmol/L (ref 20–31)
CREATININE: 0.72 mg/dL (ref 0.60–0.93)
Chloride: 103 mmol/L (ref 98–110)
GFR, Est Non African American: 85 mL/min (ref >=60)
GLUCOSE: 125 mg/dL — AB (ref 65–99)
Potassium: 3.7 mmol/L (ref 3.5–5.3)
Sodium: 140 mmol/L (ref 135–146)

## 2016-07-15 MED ORDER — HYDROCODONE-ACETAMINOPHEN 5-325 MG PO TABS: 1.0000 | ORAL_TABLET | Freq: Two times a day (BID) | ORAL | 0 refills | Status: DC | PRN

## 2016-07-15 NOTE — Telephone Encounter (Signed)
Rx up front for pick up 

## 2016-07-15 NOTE — Telephone Encounter (Signed)
Patient is aware that Rx for pain medication is ready for pickup.  Derl Barrow, RN

## 2016-07-19 ENCOUNTER — Telehealth: Payer: Self-pay | Admitting: Internal Medicine

## 2016-07-19 NOTE — Telephone Encounter (Signed)
New Message  Pt c/o medication issue:  1. Name of Medication: spironolactone  2. How are you currently taking this medication (dosage and times per day)? 25 mg tablets once daily  3. Are you having a reaction (difficulty breathing--STAT)?   4. What is your medication issue? [name] voiced since pt has been on medication she's been having dizziness, puffiness in her feet, 184.6 pt weight, pt had lab work completed on [place] and will have labs this [employer].  [name] voiced to contact the pt and she'll f/u with pt.  Pts contact info: [phone], only number listed.

## 2016-07-19 NOTE — Telephone Encounter (Signed)
Spoke with pt having dizziness since starting  Spironolactone. Pt is aware that this medication does  cause dizziness if her BP is low. Pt did not know and did not have a way to take her BP. Pt is having blood work this coming Thursday , she will have her BP check then. Pt's last BP in the office on 07/05/16 was 150/80. Pt also states that her wt is 184.6 lbs today. On 9/5 pt's wt  in the office was 181 lbs. Pt denies SOB. Pt also states that she had some  edema  (puffiness) in her feet and ankle during the day for 1 to 2 days, but is gone when she gets up in the morning. Pt states she does not have any LE puffiness now. Pt has an appointment with Scott weaver on 10/2 /17 at 10:45 AM.

## 2016-07-20 NOTE — Telephone Encounter (Signed)
If still dizzy I would recomm cutting back on spironolactone to 12.5 daily

## 2016-07-21 ENCOUNTER — Other Ambulatory Visit: Payer: Medicare Other

## 2016-07-21 DIAGNOSIS — Z79899 Other long term (current) drug therapy: Secondary | ICD-10-CM | POA: Diagnosis not present

## 2016-07-21 LAB — BASIC METABOLIC PANEL WITH GFR
BUN: 13 mg/dL (ref 7–25)
CO2: 23 mmol/L (ref 20–31)
Calcium: 8.7 mg/dL (ref 8.6–10.4)
Chloride: 104 mmol/L (ref 98–110)
Creat: 0.69 mg/dL (ref 0.60–0.93)
GFR, EST NON AFRICAN AMERICAN: 88 mL/min (ref >=60)
GFR, Est African American: >89 mL/min (ref >=60)
GLUCOSE: 122 mg/dL — AB (ref 65–99)
POTASSIUM: 3.6 mmol/L (ref 3.5–5.3)
Sodium: 141 mmol/L (ref 135–146)

## 2016-07-21 MED ORDER — SPIRONOLACTONE 25 MG PO TABS: 12.5000 mg | ORAL_TABLET | Freq: Every day | ORAL | 3 refills | Status: DC

## 2016-07-21 NOTE — Telephone Encounter (Signed)
Advised patient to decrease spironolactone to 12.5 mg once a day.  She will start today with breaking the 25 mg tablet in half.  Advised to let us know if dizziness continues.  She is appreciative for the call.

## 2016-07-21 NOTE — Addendum Note (Signed)
Addended by: Rodman Key on: 07/21/2016 09:43 AM   Modules accepted: Orders

## 2016-08-01 ENCOUNTER — Encounter: Payer: Self-pay | Admitting: Physician Assistant

## 2016-08-01 ENCOUNTER — Ambulatory Visit (INDEPENDENT_AMBULATORY_CARE_PROVIDER_SITE_OTHER): Payer: Medicare Other | Admitting: Physician Assistant

## 2016-08-01 ENCOUNTER — Encounter (INDEPENDENT_AMBULATORY_CARE_PROVIDER_SITE_OTHER): Payer: Self-pay

## 2016-08-01 VITALS — BP 164/80 | HR 63 | Ht 66.0 in | Wt 182.0 lb

## 2016-08-01 DIAGNOSIS — I110 Hypertensive heart disease with heart failure: Secondary | ICD-10-CM | POA: Diagnosis not present

## 2016-08-01 DIAGNOSIS — I5042 Chronic combined systolic (congestive) and diastolic (congestive) heart failure: Secondary | ICD-10-CM | POA: Diagnosis not present

## 2016-08-01 DIAGNOSIS — I428 Other cardiomyopathies: Secondary | ICD-10-CM | POA: Diagnosis not present

## 2016-08-01 NOTE — Patient Instructions (Addendum)
Medication Instructions:  No changes today.  Labwork: None   Testing/Procedures: None   Follow-Up: Dr. Dorris Carnes in 3 months.  Any Other Special Instructions Will Be Listed Below (If Applicable). 1.  Ask the [facility] nurse that calls you if she can check your blood pressure 2-3 times a week for 2-3 weeks.  Send me the BP readings after 2-3 weeks.  If she needs an order, let us know. 2.  See if your granddaughter can check your BP and send me the readings. 3.  I will give you a Rx for a BP cuff.  Medicare may cover it. 4.  Once I see your blood pressures I will decide if we change your medication or set up a repeat Echocardiogram.  If you need a refill on your cardiac medications before your next appointment, please call your pharmacy.

## 2016-08-01 NOTE — Progress Notes (Signed)
Cardiology Office Note:    Date:  08/01/2016   ID:  Donna Herrera, DOB [birth year], MRN [id number]  PCP:  Georges Lynch, MD  Cardiologist:  Dr. Dorris Carnes   Electrophysiologist:  n/a  Referring MD: Elberta Leatherwood, MD   Chief Complaint  Patient presents with  . Follow-up    CHF    History of Present Illness:    Donna Herrera is a 70 y.o. female with a hx of DCM with EF 123456, systolic HF, tobacco abuse, HTN, HL. LHC in 7/17 demonstrated no CAD. Last seen by me 9/17.  She returns for further medication titration for her CHF.    She is doing well. She continues to struggle with hip and knee arthritis.  She walks with crutches. She denies chest pain.  She denies any change in her shortness of breath.  She denies orthopnea, PND, edema.  She denies syncope.  She has not taken any medications yet today.  She had to decrease her Spironolactone due to dizziness.  She does not take her blood pressure at home.    Prior CV studies that were reviewed today include:    LHC 05/11/16 1. Normal coronary anatomy 2. Severe LV dysfunction with EF 30-35%. 3. Normal right heart and LV filling pressures.  Echo 10/05/15 Mild LVH, EF 35-40%, diffuse HK, grade 2 diastolic dysfunction, MAC, mild MR, moderate LAE, normal RVSF, PASP 49 mmHg  Echo 01/16/15 EF 40%, diffuse HK, mild MR, moderate LAE, mild RAE, PASP 42 mmHg  Past Medical History:  Diagnosis Date  . Allergy   . Arthritis   . Chronic combined systolic and diastolic CHF (congestive heart failure) ([place]) 10/21/2015   A. Echo 3/16: EF 40%, diffuse HK, mild MR, moderate LAE, mild RAE, PASP 42 mmHg  //  B. Echo 12/16: Mild LVH, EF 35-40%, diffuse HK, grade 2 diastolic dysfunction, MAC, mild MR, moderate LAE, normal RVSF, PASP 49 mmHg  . Chronic pain   . GERD (gastroesophageal reflux disease)   . Gout   . H/O slipped capital femoral epiphysis (SCFE)   . Hyperlipidemia   . Hypertension   . Hypertensive heart disease with CHF (congestive  heart failure) ([place]) 10/12/2012  . NICM (nonischemic cardiomyopathy) ([place]) 06/09/2016   A. LHC 7/17: Normal coronary arteries, EF 30-35%, LVEDP 14 mmHg    Past Surgical History:  Procedure Laterality Date  . CARDIAC CATHETERIZATION  2005   normal-[place]  . CARDIAC CATHETERIZATION N/A 05/11/2016   Procedure: Right/Left Heart Cath and Coronary Angiography;  Surgeon: Peter M Martinique, MD;  Location: [facility];  Service: Cardiovascular;  Laterality: N/A;  . CESAREAN SECTION    . HIP FUSION     left   . [place]  . ROTATOR CUFF REPAIR     bilateral  . TOTAL HIP ARTHROPLASTY  1987   right. Dr. Collier Salina  . TUBAL LIGATION      Current Medications: Current Meds  Medication Sig  . aspirin 81 MG tablet Take 1 tablet (81 mg total) by mouth daily.  . carvedilol (COREG) 6.25 MG tablet Take 1 tablet (6.25 mg total) by mouth 2 (two) times daily.  . cetirizine (ZYRTEC) 10 MG tablet TAKE 1 TABLET (10 MG TOTAL) BY MOUTH DAILY.  [name] colchicine 0.6 MG tablet TAKE 1 TABLET BY MOUTH 2 TIMES DAILY  . cyclobenzaprine (FLEXERIL) 10 MG tablet Take 1 tablet (10 mg total) by mouth 2 (two) times daily as needed for  muscle spasms.  . furosemide (LASIX) 20 MG tablet TAKE 1 TABLET (20 MG TOTAL) BY MOUTH DAILY.  [name] HYDROcodone-acetaminophen (NORCO/VICODIN) 5-325 MG tablet Take 1 tablet by mouth 2 (two) times daily as needed for moderate pain.  [name] losartan (COZAAR) 100 MG tablet Take 1 tablet (100 mg total) by mouth daily.  . pantoprazole (PROTONIX) 20 MG tablet TAKE 2 TABLETS (40 MG TOTAL) BY MOUTH DAILY.  [name] spironolactone (ALDACTONE) 25 MG tablet Take 0.5 tablets (12.5 mg total) by mouth daily.  [name] tiZANidine (ZANAFLEX) 2 MG tablet Take 1 tablet (2 mg total) by mouth every 6 (six) hours as needed for muscle spasms.  [name] triamcinolone ointment (KENALOG) 0.5 % Apply 1 application topically 2 (two) times daily. To palms     Allergies:   Crab [shellfish allergy] and Morphine  and related   Social History   Social History  . Marital status: Married    Spouse name: N/A  . Number of children: N/A  . Years of education: N/A   Social History Main Topics  . Smoking status: Current Every Day Smoker    Packs/day: 0.50  . Smokeless tobacco: Current User     Comment: decreased smoking/1 pack every 3 days  . Alcohol use Yes     Comment: on holidays  . Drug use: No  . Sexual activity: Not Asked   Other Topics Concern  . 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     Family History:  The patient's family history includes Cancer in her father; Depression in her mother; Diabetes in her mother and sister; Heart disease in her mother; Hypertension in her mother; Stroke in her mother.   ROS:   Please see the history of present illness.    ROS All other systems reviewed and are negative.   EKGs/Labs/Other Test Reviewed:    EKG:  EKG is  ordered today.  The ekg ordered today demonstrates NSR, HR 63, normal axis, QTc 478 ms, no changes.   Recent Labs: 10/04/2015: Magnesium 2.1; TSH 0.582 10/05/2015: B Natriuretic Peptide 1,015.2 10/21/2015: ALT 11 05/09/2016: Hemoglobin 13.8; Platelets 244 07/21/2016: BUN 13; Creat 0.69; Potassium 3.6; Sodium 141   Recent Lipid Panel    Component Value Date/Time   CHOL 177 10/04/2015 0141   TRIG 78 10/04/2015 0141   HDL 50 10/04/2015 0141   CHOLHDL 3.5 10/04/2015 0141   VLDL 16 10/04/2015 0141   LDLCALC 111 (H) 10/04/2015 0141     Physical Exam:    VS:  BP (!) 164/80   Pulse 63   Ht 5\' 6"  (1.676 m)   Wt 182 lb (82.6 kg)   BMI 29.38 kg/m     Wt Readings from Last 3 Encounters:  08/01/16 182 lb (82.6 kg)  07/05/16 181 lb 1.9 oz (82.2 kg)  06/09/16 181 lb 6.4 oz (82.3 kg)     Physical Exam  Constitutional: She is oriented to person, place, and time. She appears well-developed and well-nourished. No distress.  HENT:  Head: Normocephalic and  atraumatic.  Eyes: No scleral icterus.  Neck: No JVD present.  Cardiovascular: Normal rate, regular rhythm and normal heart sounds.   No murmur heard. Pulmonary/Chest: Effort normal. She has no wheezes. She has no rales.  Abdominal: Soft. There is no tenderness.  Musculoskeletal: She exhibits no edema.  Neurological: She is alert and oriented to person, place, and time.  Skin: Skin is warm and dry.  Psychiatric:  She has a normal mood and affect.    ASSESSMENT:    1. Chronic combined systolic and diastolic CHF (congestive heart failure) ([place])   2. NICM (nonischemic cardiomyopathy) ([place])   3. Hypertensive heart disease with CHF (congestive heart failure) ([place])    PLAN:    In order of problems listed above:  1. Chronic combined systolic and diastolic CHF - Volume stable.  She is limited by DJD and is probably NYHA 2b.  Continue Carvedilol, Losartan, Spironolactone.  I am not sure if her BP is optimal or still running high.  She has not taken any medications yet today.  Will hold off on adding Entresto for now.  2. NICM - No CAD by LHC. EF 30-35% on LHC in 7/17.  Consider Repeat Echo once BP optimized.  If home BPs (see below) optimal, will arrange Echo in next 1-2 mos.  If EF < 35%, refer to EP for +/- ICD.  3. HTN - BP uncontrolled.  But, she has not had any medications yet today.  She had to decrease Spironolactone due to dizziness.  I have asked her to monitor her blood pressure over the next 2-3 weeks and send me her readings.  If BP at target, will not adjust meds further.  If BP could tolerate Entresto, will consider this.  If BP above target, will either add Entresto or Hydralazine/Nitrates.     Medication Adjustments/Labs and Tests Ordered: Current medicines are reviewed at length with the patient today.  Concerns regarding medicines are outlined above.  Medication changes, Labs and Tests ordered today are outlined in the Patient Instructions noted below. Patient Instructions    Medication Instructions:  No changes today.  Labwork: None   Testing/Procedures: None   Follow-Up: Dr. Dorris Carnes in 3 months.  Any Other Special Instructions Will Be Listed Below (If Applicable). 1.  Ask the [facility] nurse that calls you if she can check your blood pressure 2-3 times a week for 2-3 weeks.  Send me the BP readings after 2-3 weeks.  If she needs an order, let us know. 2.  See if your granddaughter can check your BP and send me the readings. 3.  I will give you a Rx for a BP cuff.  Medicare may cover it. 4.  Once I see your blood pressures I will decide if we change your medication or set up a repeat Echocardiogram.  If you need a refill on your cardiac medications before your next appointment, please call your pharmacy.    Signed, [name], PA-C  08/01/2016 11:37 AM    [facility] [place], [place], [place]  [address] Phone: [phone]; Fax: [phone]

## 2016-08-03 NOTE — Addendum Note (Signed)
Addended by: Michae Kava on: 08/03/2016 11:20 AM   Modules accepted: Orders

## 2016-08-23 ENCOUNTER — Telehealth: Payer: Self-pay | Admitting: Physician Assistant

## 2016-08-23 NOTE — Telephone Encounter (Signed)
At last OV, I asked for the patient to monitor her blood pressure and send me some readings after a few weeks. Can we call her to see if she has some blood pressure readings? Thanks, [name], PA-C   08/23/2016 9:09 PM

## 2016-08-25 ENCOUNTER — Encounter: Payer: Self-pay | Admitting: Family Medicine

## 2016-08-25 ENCOUNTER — Ambulatory Visit (INDEPENDENT_AMBULATORY_CARE_PROVIDER_SITE_OTHER): Payer: Medicare Other | Admitting: Family Medicine

## 2016-08-25 DIAGNOSIS — F329 Major depressive disorder, single episode, unspecified: Secondary | ICD-10-CM | POA: Diagnosis not present

## 2016-08-25 DIAGNOSIS — F32A Depression, unspecified: Secondary | ICD-10-CM | POA: Insufficient documentation

## 2016-08-25 DIAGNOSIS — K0401 Reversible pulpitis: Secondary | ICD-10-CM | POA: Diagnosis not present

## 2016-08-25 HISTORY — DX: Depression, unspecified: F32.A

## 2016-08-25 MED ORDER — FLUOXETINE HCL 20 MG PO TABS: 20.0000 mg | ORAL_TABLET | Freq: Every day | ORAL | 3 refills | Status: DC

## 2016-08-25 MED ORDER — HYDROCODONE-ACETAMINOPHEN 5-325 MG PO TABS: 1.0000 | ORAL_TABLET | Freq: Two times a day (BID) | ORAL | 0 refills | Status: DC | PRN

## 2016-08-25 MED ORDER — AMOXICILLIN-POT CLAVULANATE 500-125 MG PO TABS: 1.0000 | ORAL_TABLET | Freq: Two times a day (BID) | ORAL | 0 refills | Status: DC

## 2016-08-25 NOTE — Assessment & Plan Note (Signed)
Patient exhibiting a significant amount of concern for depression. No SI/HI. She and I had a long discussion about these feelings and ways to get around them. She was willing to start medication if I felt it was appropriate. - Fluoxetine 20 mg daily - I've asked patient to follow-up in 1-2 months

## 2016-08-25 NOTE — Progress Notes (Signed)
   HPI  CC: Just doesn't feel good Patient is here because she "just doesn't feel good". She states that she has been feeling this way over the past month or so but it has gotten significantly worse over the past couple weeks. She endorses a lack of interest in normal activities. She doesn't want to get out of bed regularly. She seems to be having more pain recently. No SI/HI. She denies any chest pain but states that she has been feeling much worse since having all of her issues with her heart. No specific area of pain outside of her baseline. She endorses significant fatigue.  Patient is also endorsing some tooth pain. Pain is located to the right lower jaw. She states that this is been going on for a few weeks as well. [place] fevers or chills. No headaches. She does not have a dentist.  Review of Systems   See HPI for ROS. All other systems reviewed and are negative.  CC, SH/smoking status, and VS noted  Objective: BP (!) 146/78   Pulse 69   Temp 98 F (36.7 C) (Oral)   Ht 5\' 6"  (1.676 m)   Wt 184 lb 6.4 oz (83.6 kg)   BMI 29.76 kg/m  Gen: NAD, alert, cooperative, melancholy, appears fatigued HEENT: NCAT, EOMI, PERRL, no LAD, poor dentition with multiple missing teeth. Gingival erythema and tenderness around a severely eroded tooth. CV: RRR, no murmur, no peripheral edema Resp: non-labored Neuro: Alert and oriented, Speech clear, No gross deficits  Assessment and plan:  Acute pulpitis Patient here with concern for gingival abscess. The systemic symptoms at this time. - 1 week Augmentin twice a day - Patient advised to see dentist and information on nearby dental offices provided.  Depression Patient exhibiting a significant amount of concern for depression. No SI/HI. She and I had a long discussion about these feelings and ways to get around them. She was willing to start medication if I felt it was appropriate. - Fluoxetine 20 mg daily - I've asked patient to follow-up in  1-2 months   Meds ordered this encounter  Medications  . FLUoxetine (PROZAC) 20 MG tablet    Sig: Take 1 tablet (20 mg total) by mouth daily.    Dispense:  30 tablet    Refill:  3  . HYDROcodone-acetaminophen (NORCO/VICODIN) 5-325 MG tablet    Sig: Take 1 tablet by mouth 2 (two) times daily as needed for moderate pain.    Dispense:  30 tablet    Refill:  0  . amoxicillin-clavulanate (AUGMENTIN) 500-125 MG tablet    Sig: Take 1 tablet (500 mg total) by mouth 2 (two) times daily.    Dispense:  14 tablet    Refill:  0     Elberta Leatherwood, MD,MS,  PGY3 08/25/2016 6:37 PM

## 2016-08-25 NOTE — Assessment & Plan Note (Signed)
Patient here with concern for gingival abscess. The systemic symptoms at this time. - 1 week Augmentin twice a day - Patient advised to see dentist and information on nearby dental offices provided.

## 2016-08-26 NOTE — Telephone Encounter (Signed)
Lmtcb per [employer]. PA to see if pt has taken any BP readings as advised when she saw Brynda Rim. PA on 08/01/16.

## 2016-09-08 NOTE — Telephone Encounter (Signed)
Can we check again with the patient to see if she has been checking her blood pressure at home? [name], PA-C   09/08/2016 8:50 AM

## 2016-09-09 NOTE — Telephone Encounter (Signed)
Lmtcb x 2 to see if pt has BP readings for [employer]. PA as outlined in his OV 10/9.

## 2016-09-27 ENCOUNTER — Telehealth: Payer: Self-pay | Admitting: Physician Assistant

## 2016-09-27 NOTE — Telephone Encounter (Signed)
I had asked for Donna Herrera to call in with blood pressure readings after her last appointment. We have not heard from her. Please try to reach her again to see what her blood pressure has been since last seen. [name], PA-C   09/27/2016 4:47 PM

## 2016-09-28 ENCOUNTER — Ambulatory Visit (INDEPENDENT_AMBULATORY_CARE_PROVIDER_SITE_OTHER): Payer: Medicare Other | Admitting: Family Medicine

## 2016-09-28 ENCOUNTER — Encounter: Payer: Self-pay | Admitting: Family Medicine

## 2016-09-28 DIAGNOSIS — Z23 Encounter for immunization: Secondary | ICD-10-CM

## 2016-09-28 DIAGNOSIS — F329 Major depressive disorder, single episode, unspecified: Secondary | ICD-10-CM

## 2016-09-28 DIAGNOSIS — F32A Depression, unspecified: Secondary | ICD-10-CM

## 2016-09-28 MED ORDER — HYDROCODONE-ACETAMINOPHEN 5-325 MG PO TABS: 1.0000 | ORAL_TABLET | Freq: Two times a day (BID) | ORAL | 0 refills | Status: DC | PRN

## 2016-09-28 NOTE — Progress Notes (Signed)
   HPI  CC: Depression Patient is here for follow-up on her depression. She states that she is feeling much better than her last visit. She feels like she is no longer "on edge". She had a "wonderful" Thanksgiving holiday And is very pleased with the fluoxetine she is now on. She states that she has been compliant with this medication with the exception that she is only taking it every other day. She says that she has had no complications with this medication and would like to continue to take it only every other day. She denies any headache, blurred vision, dry mouth, nausea, vomiting, diarrhea, dizziness, shortness of breath, chest pain, diaphoresis, numbness, weakness, or paresthesias.  Review of Systems    See HPI for ROS. All other systems reviewed and are negative.  CC, SH/smoking status, and VS noted  Objective: BP (!) 166/82   Pulse 73   Temp 98.6 F (37 C) (Oral)   Ht 5\' 6"  (1.676 m)   Wt 179 lb 3.2 oz (81.3 kg)   SpO2 98%   BMI 28.92 kg/m  Gen: NAD, alert, cooperative, and pleasant. CV: RRR, no murmur Resp: CTAB, no wheezes, non-labored Neuro: Alert and oriented, Speech clear, No gross deficits, normal affect.  Assessment and plan:  Depression Improved: Patient is here for follow-up after initiating treatment with fluoxetine for her depressive symptoms. Patient endorses significant improvement in her depression. She also endorses taking this medication differently than prescribed. She only takes 1 tablet every other day. - Continue fluoxetine. - I informed her that it was appropriate for her to take fluoxetine every other day as long as she is receiving benefit from this medication. - Follow-up in 3 months   Orders Placed This Encounter  Procedures  . Flu Vaccine QUAD 36+ mos IM    Meds ordered this encounter  Medications  . DISCONTD: HYDROcodone-acetaminophen (NORCO/VICODIN) 5-325 MG tablet    Sig: Take 1 tablet by mouth 2 (two) times daily as needed for  moderate pain.    Dispense:  30 tablet    Refill:  0  . DISCONTD: HYDROcodone-acetaminophen (NORCO/VICODIN) 5-325 MG tablet    Sig: Take 1 tablet by mouth 2 (two) times daily as needed for moderate pain.    Dispense:  30 tablet    Refill:  0    Do Not Fill Until 10/28/16  . HYDROcodone-acetaminophen (NORCO/VICODIN) 5-325 MG tablet    Sig: Take 1 tablet by mouth 2 (two) times daily as needed for moderate pain.    Dispense:  30 tablet    Refill:  0    Do Not Fill Until 11/28/16     Elberta Leatherwood, MD,MS,  PGY3 09/28/2016 7:22 PM

## 2016-09-28 NOTE — Assessment & Plan Note (Signed)
Improved: Patient is here for follow-up after initiating treatment with fluoxetine for her depressive symptoms. Patient endorses significant improvement in her depression. She also endorses taking this medication differently than prescribed. She only takes 1 tablet every other day. - Continue fluoxetine. - I informed her that it was appropriate for her to take fluoxetine every other day as long as she is receiving benefit from this medication. - Follow-up in 3 months

## 2016-09-28 NOTE — Patient Instructions (Signed)
It was a pleasure seeing you today in our clinic. Today we discussed Your new medication. Here is the treatment plan we have discussed and agreed upon together:   - I'm glad that the new medication you're taking has worked so well. I am okay with you continuing to take 1 pill every other day as long as this medication continues to work. - I like to see you back in 3 months.

## 2016-09-30 ENCOUNTER — Encounter: Payer: Self-pay | Admitting: *Deleted

## 2016-09-30 NOTE — Telephone Encounter (Signed)
Scott I have left 2 messages to get BP readings. I will send out a letter today.

## 2016-10-05 ENCOUNTER — Telehealth: Payer: Self-pay | Admitting: Family Medicine

## 2016-10-05 NOTE — Telephone Encounter (Signed)
Pt would like an antibiotic for an infected tooth. [facility] [address]. Please advise. Thanks! ep

## 2016-10-06 NOTE — Telephone Encounter (Signed)
Patient needs an appt (SDA) for evaluation, please!

## 2016-10-06 NOTE — Telephone Encounter (Signed)
Please schedule patient an appointment.  Derl Barrow, RN

## 2016-12-14 ENCOUNTER — Ambulatory Visit (INDEPENDENT_AMBULATORY_CARE_PROVIDER_SITE_OTHER): Payer: Medicare Other | Admitting: Family Medicine

## 2016-12-14 ENCOUNTER — Encounter: Payer: Self-pay | Admitting: Family Medicine

## 2016-12-14 VITALS — BP 144/92 | HR 71 | Temp 98.2°F | Ht 66.0 in | Wt 182.0 lb

## 2016-12-14 DIAGNOSIS — I5042 Chronic combined systolic (congestive) and diastolic (congestive) heart failure: Secondary | ICD-10-CM

## 2016-12-14 DIAGNOSIS — G8929 Other chronic pain: Secondary | ICD-10-CM

## 2016-12-14 DIAGNOSIS — F32A Depression, unspecified: Secondary | ICD-10-CM

## 2016-12-14 DIAGNOSIS — M25512 Pain in left shoulder: Secondary | ICD-10-CM | POA: Diagnosis not present

## 2016-12-14 DIAGNOSIS — Z23 Encounter for immunization: Secondary | ICD-10-CM | POA: Diagnosis not present

## 2016-12-14 DIAGNOSIS — F329 Major depressive disorder, single episode, unspecified: Secondary | ICD-10-CM

## 2016-12-14 MED ORDER — HYDROCODONE-ACETAMINOPHEN 5-325 MG PO TABS: 1.0000 | ORAL_TABLET | Freq: Two times a day (BID) | ORAL | 0 refills | Status: DC | PRN

## 2016-12-14 MED ORDER — ATORVASTATIN CALCIUM 10 MG PO TABS: 10.0000 mg | ORAL_TABLET | Freq: Every day | ORAL | 1 refills | Status: DC

## 2016-12-14 MED ORDER — FLUOXETINE HCL 20 MG PO TABS: 20.0000 mg | ORAL_TABLET | Freq: Every day | ORAL | 3 refills | Status: DC

## 2016-12-14 MED ORDER — TRIAMCINOLONE ACETONIDE 0.5 % EX OINT: 1.0000 "application " | TOPICAL_OINTMENT | Freq: Two times a day (BID) | CUTANEOUS | 3 refills | Status: DC

## 2016-12-14 NOTE — Progress Notes (Signed)
HPI  CC: Medication check Patient is here for a medication check. She states that she has been doing well with her new prescription of Prozac. She endorses good response to this medication and no adverse side effects at this time. She feels well overall. Denies any fevers, chills, appetite changes, peripheral edema, orthopnea, tremors, numbness, weakness, or paresthesias.  CHF: Patient states that she has been compliant with all of her medications. She has no issues at this time. No symptoms of chest pain or increased shortness of breath. She continues to smoke cigarettes, approximately 1/3 packs a day. No issues with sleep. Patient is satisfied with her current respiratory and activity status.  Chronic shoulder pain: Overall pain is well-managed. No acute changes over the past couple months. She states that today she has a little bit of increased soreness in her left shoulder. Denies any injury or trauma. No new swelling or erythema. ROM is at baseline.  Review of Systems    See HPI for ROS. All other systems reviewed and are negative.  CC, SH/smoking status, and VS noted  Objective: BP (!) 144/92   Pulse 71   Temp 98.2 F (36.8 C) (Oral)   Ht 5\' 6"  (1.676 m)   Wt 182 lb (82.6 kg)   BMI 29.38 kg/m  Gen: NAD, alert, cooperative, and pleasant. CV: RRR, no murmur Resp: CTAB, no wheezes or crackles, non-labored MSK: No erythema, edema, or rash present. Left shoulder noted to have pain noted w/ active abduction, and palpation over the anterior and lateral aspect of the left shoulder. Remainder of shoulder exam was consistent with previous exams.  Neuro: Alert and oriented, Speech clear, No gross deficits  Assessment and plan:  Depression Patient endorses good response on new medication, Prozac. No issues at this time. Patient feels comfortable staying at this dosing. - No changes at this time. - We will repeat PHQ9 at next visit - Follow-up 3 months  Chronic combined systolic  and diastolic CHF (congestive heart failure) ([place]) Patient reports no significant changes with her CHF. No new peripheral edema. No worsening shortness of breath. No cough. No chest pain. Activity/exercise tolerance is at baseline. Continues to smoke. - Continue ASA - Restarted atorvastatin - Continue current course of carvedilol, Lasix, spironolactone, and ARB (patient appropriately on all ideal heart failure medications) - Follow-up with cardiology as recommended by cardiologist  Chronic left shoulder pain Patient is endorsing stable left shoulder pain. Pain is slightly worse today but she states that it is not abnormal to have "some bad days". Patient is pleased with current pain management and feels as though her baseline is under good control. - No change in therapy at this time. - Tylenol as needed - Norco for bad days.   Meds ordered this encounter  Medications  . atorvastatin (LIPITOR) 10 MG tablet    Sig: Take 1 tablet (10 mg total) by mouth daily.    Dispense:  90 tablet    Refill:  1  . HYDROcodone-acetaminophen (NORCO/VICODIN) 5-325 MG tablet    Sig: Take 1 tablet by mouth 2 (two) times daily as needed for moderate pain.    Dispense:  30 tablet    Refill:  0    Do Not Fill Until 12/14/16  . triamcinolone ointment (KENALOG) 0.5 %    Sig: Apply 1 application topically 2 (two) times daily. To palms    Dispense:  30 g    Refill:  3  . FLUoxetine (PROZAC) 20 MG tablet  Sig: Take 1 tablet (20 mg total) by mouth daily.    Dispense:  30 tablet    Refill:  3     Elberta Leatherwood, MD,MS,  PGY3 12/14/2016 2:34 PM

## 2016-12-14 NOTE — Assessment & Plan Note (Addendum)
Patient reports no significant changes with her CHF. No new peripheral edema. No worsening shortness of breath. No cough. No chest pain. Activity/exercise tolerance is at baseline. Continues to smoke. - Continue ASA - Restarted atorvastatin - Continue current course of carvedilol, Lasix, spironolactone, and ARB (patient appropriately on all ideal heart failure medications) - Follow-up with cardiology as recommended by cardiologist

## 2016-12-14 NOTE — Addendum Note (Signed)
Addended by: Levert Feinstein F on: 12/14/2016 05:02 PM   Modules accepted: Orders

## 2016-12-14 NOTE — Assessment & Plan Note (Signed)
Patient endorses good response on new medication, Prozac. No issues at this time. Patient feels comfortable staying at this dosing. - No changes at this time. - We will repeat PHQ9 at next visit - Follow-up 3 months

## 2016-12-14 NOTE — Assessment & Plan Note (Signed)
Patient is endorsing stable left shoulder pain. Pain is slightly worse today but she states that it is not abnormal to have "some bad days". Patient is pleased with current pain management and feels as though her baseline is under good control. - No change in therapy at this time. - Tylenol as needed - Norco for bad days.

## 2016-12-14 NOTE — Patient Instructions (Signed)
It was a pleasure seeing you today in our clinic. Today we discussed your medications. Here is the treatment plan we have discussed and agreed upon together:   - I have restarted you on atorvastatin. Take 1 tablet daily. - As we discussed in clinic today do your best to reduce how many cigarettes or smoking. - I provided you the number and address for the [facility]. Please call them to schedule an appointment to have a mammogram performed. - You can have your eyes examined at any of the local optometrists. Many of my patients choose to be seen at the local [facility].

## 2016-12-16 ENCOUNTER — Telehealth: Payer: Self-pay | Admitting: Family Medicine

## 2016-12-16 NOTE — Telephone Encounter (Signed)
[facility]  Attempted to call pt no answer and no machine. LMOVM of Donna Herrera @ [facility] informing her of the above. Neo Yepiz, Salome Spotted, CMA

## 2016-12-16 NOTE — Telephone Encounter (Signed)
Nurse with [facility]; pt was seen by [facility] on 12-14-16 and was told she would be taking a choloestorl medication but nothing was called into her pharmay, [facility]. Please advise

## 2016-12-29 ENCOUNTER — Other Ambulatory Visit: Payer: Self-pay | Admitting: Family Medicine

## 2016-12-29 DIAGNOSIS — Z1231 Encounter for screening mammogram for malignant neoplasm of breast: Secondary | ICD-10-CM

## 2017-01-24 ENCOUNTER — Ambulatory Visit
Admission: RE | Admit: 2017-01-24 | Discharge: 2017-01-24 | Disposition: A | Discharge status: Home / Self Care | Payer: Medicare Other | Source: Ambulatory Visit | Attending: Family Medicine | Admitting: Family Medicine

## 2017-01-24 DIAGNOSIS — Z1231 Encounter for screening mammogram for malignant neoplasm of breast: Secondary | ICD-10-CM

## 2017-02-27 ENCOUNTER — Other Ambulatory Visit: Payer: Self-pay | Admitting: Family Medicine

## 2017-02-27 MED ORDER — COLCHICINE 0.6 MG PO TABS: ORAL_TABLET | ORAL | 0 refills | Status: DC

## 2017-02-27 NOTE — Telephone Encounter (Signed)
I would still like to have patient seen. But Rx sent in.

## 2017-02-27 NOTE — Telephone Encounter (Signed)
Gout has flared up. It is in her toes.  She was taking colthizine (sp). Could a refill be called in? [place]

## 2017-02-28 NOTE — Telephone Encounter (Signed)
Left message on patient voicemail informing that rx was sent in but to call back to schedule an appointment in the next few weeks.

## 2017-03-06 ENCOUNTER — Ambulatory Visit (INDEPENDENT_AMBULATORY_CARE_PROVIDER_SITE_OTHER): Payer: Medicare Other | Admitting: Family Medicine

## 2017-03-06 ENCOUNTER — Encounter: Payer: Self-pay | Admitting: Family Medicine

## 2017-03-06 VITALS — BP 150/80 | HR 71 | Temp 98.7°F | Ht 66.0 in | Wt 178.8 lb

## 2017-03-06 DIAGNOSIS — M1990 Unspecified osteoarthritis, unspecified site: Secondary | ICD-10-CM | POA: Diagnosis not present

## 2017-03-06 DIAGNOSIS — M1A00X0 Idiopathic chronic gout, unspecified site, without tophus (tophi): Secondary | ICD-10-CM

## 2017-03-06 MED ORDER — HYDROCODONE-ACETAMINOPHEN 5-325 MG PO TABS: 1.0000 | ORAL_TABLET | Freq: Two times a day (BID) | ORAL | 0 refills | Status: DC | PRN

## 2017-03-06 MED ORDER — FUROSEMIDE 20 MG PO TABS: ORAL_TABLET | ORAL | 3 refills | Status: DC

## 2017-03-06 MED ORDER — LOSARTAN POTASSIUM 100 MG PO TABS: 100.0000 mg | ORAL_TABLET | Freq: Every day | ORAL | 3 refills | Status: DC

## 2017-03-06 MED ORDER — CYCLOBENZAPRINE HCL 10 MG PO TABS: 10.0000 mg | ORAL_TABLET | Freq: Two times a day (BID) | ORAL | 2 refills | Status: DC | PRN

## 2017-03-06 MED ORDER — CETIRIZINE HCL 10 MG PO TABS: ORAL_TABLET | ORAL | 5 refills | Status: DC

## 2017-03-06 NOTE — Assessment & Plan Note (Signed)
Patient has a extensive history of joint inflammation and bony destruction secondary to various "flares". Patient has never been seen by rheumatology. Consideration for RA. - Referral to rheumatology placed today.

## 2017-03-06 NOTE — Assessment & Plan Note (Signed)
Patient currently has improving pain, signs, and symptoms of gouty flare. Colchicine have been called in last week and patient endorses improvement once she initiated this medication. - Continue colchicine - Patient is currently on Lasix which may increase her risk for future gouty flares. - Consideration for initiating allopurinol in the future.

## 2017-03-06 NOTE — Progress Notes (Signed)
HPI  CC: Medication follow-up. Patient is here for medication follow-up and refills. She states she has been taking her medications as prescribed and has no complaints of adverse side effects. She overall feels well.  Joint pain: Patient's persistent/chronic joint pain continues to be an issue. She is currently receiving Ultram for this pain and states that it seems to be under good control at this time. Patient also stated that she had a recent gouty flare of her left ankle. This is improving with initiation of colchicine. She denies any fevers or chills. No headache, blurred vision, shortness of breath, chest pain. No nausea, vomiting, or diarrhea. Denies weakness, numbness, or paresthesias.  Review of Systems See HPI for ROS.   CC, SH/smoking status, and VS noted  Objective: BP (!) 150/80   Pulse 71   Temp 98.7 F (37.1 C) (Oral)   Ht 5\' 6"  (1.676 m)   Wt 178 lb 12.8 oz (81.1 kg)   SpO2 99%   BMI 28.86 kg/m  Gen: NAD, alert, cooperative. CV: Well-perfused. Resp: Non-labored. Neuro: Sensation intact throughout. Foot, Left: Obvious edema present when compared to the right. TTP along the medial and lateral joint line. Erythema present. Pulses intact throughout. Bony deformity consistent with osteophyte development along the midfoot.   Assessment and plan:  Gout Patient currently has improving pain, signs, and symptoms of gouty flare. Colchicine have been called in last week and patient endorses improvement once she initiated this medication. - Continue colchicine - Patient is currently on Lasix which may increase her risk for future gouty flares. - Consideration for initiating allopurinol in the future.  Joint inflammation Patient has a extensive history of joint inflammation and bony destruction secondary to various "flares". Patient has never been seen by rheumatology. Consideration for RA. - Referral to rheumatology placed today.   Orders Placed This Encounter    Procedures  . Ambulatory referral to Rheumatology    Referral Priority:   Routine    Referral Type:   Consultation    Referral Reason:   Specialty Services Required    Requested Specialty:   Rheumatology    Number of Visits Requested:   1    Meds ordered this encounter  Medications  . cetirizine (ZYRTEC) 10 MG tablet    Sig: TAKE 1 TABLET (10 MG TOTAL) BY MOUTH DAILY.    Dispense:  30 tablet    Refill:  5  . furosemide (LASIX) 20 MG tablet    Sig: TAKE 1 TABLET (20 MG TOTAL) BY MOUTH DAILY.    Dispense:  90 tablet    Refill:  3  . cyclobenzaprine (FLEXERIL) 10 MG tablet    Sig: Take 1 tablet (10 mg total) by mouth 2 (two) times daily as needed for muscle spasms.    Dispense:  30 tablet    Refill:  2  . losartan (COZAAR) 100 MG tablet    Sig: Take 1 tablet (100 mg total) by mouth daily.    Dispense:  90 tablet    Refill:  3  . DISCONTD: HYDROcodone-acetaminophen (NORCO/VICODIN) 5-325 MG tablet    Sig: Take 1 tablet by mouth 2 (two) times daily as needed for moderate pain.    Dispense:  30 tablet    Refill:  0    Do Not Fill Until 03/06/17  . DISCONTD: HYDROcodone-acetaminophen (NORCO/VICODIN) 5-325 MG tablet    Sig: Take 1 tablet by mouth 2 (two) times daily as needed for moderate pain.    Dispense:  30  tablet    Refill:  0    Do Not Fill Until 04/06/17  . HYDROcodone-acetaminophen (NORCO/VICODIN) 5-325 MG tablet    Sig: Take 1 tablet by mouth 2 (two) times daily as needed for moderate pain.    Dispense:  30 tablet    Refill:  0    Do Not Fill Until 05/06/17     Elberta Leatherwood, MD,MS,  PGY3 03/06/2017 1:43 PM

## 2017-03-08 ENCOUNTER — Telehealth: Payer: Self-pay | Admitting: Family Medicine

## 2017-03-08 NOTE — Telephone Encounter (Signed)
Pt states she was unable to pick up the medications called in for her because the Rx did not have her full name. Rx has to be written for Donna Herrera. [facility] [address]. Zyrtec, flexeril, lasix, hydrocodone, and losartan were called in. ep

## 2017-03-08 NOTE — Telephone Encounter (Signed)
Called Pharmacy, got it taken care of.  Nursing: Would you mind forwarding this to whoever can make these changes? Thanks!  Patient is going to need to have her name changed in our system (per the pharmacy). They will need to incorporate the "Donna Herrera" part of her name somehow (likely hyphenated). If this doesn't happen then this will likely be an issue every refill moving forward. If you need more specifics just contact the [facility] on [address].   Thank you!

## 2017-03-26 ENCOUNTER — Emergency Department (HOSPITAL_COMMUNITY)
Admission: EM | Admit: 2017-03-26 | Discharge: 2017-03-26 | Disposition: A | Discharge status: Home / Self Care | Payer: Medicare Other | Attending: Emergency Medicine | Admitting: Emergency Medicine

## 2017-03-26 ENCOUNTER — Emergency Department (HOSPITAL_COMMUNITY): Payer: Medicare Other

## 2017-03-26 ENCOUNTER — Encounter (HOSPITAL_COMMUNITY): Payer: Self-pay | Admitting: Emergency Medicine

## 2017-03-26 DIAGNOSIS — I5042 Chronic combined systolic (congestive) and diastolic (congestive) heart failure: Secondary | ICD-10-CM | POA: Insufficient documentation

## 2017-03-26 DIAGNOSIS — Z96641 Presence of right artificial hip joint: Secondary | ICD-10-CM | POA: Diagnosis not present

## 2017-03-26 DIAGNOSIS — I110 Hypertensive heart disease with heart failure: Secondary | ICD-10-CM | POA: Diagnosis not present

## 2017-03-26 DIAGNOSIS — Z79899 Other long term (current) drug therapy: Secondary | ICD-10-CM | POA: Insufficient documentation

## 2017-03-26 DIAGNOSIS — M546 Pain in thoracic spine: Secondary | ICD-10-CM | POA: Insufficient documentation

## 2017-03-26 DIAGNOSIS — R1011 Right upper quadrant pain: Secondary | ICD-10-CM

## 2017-03-26 DIAGNOSIS — Z7982 Long term (current) use of aspirin: Secondary | ICD-10-CM | POA: Diagnosis not present

## 2017-03-26 DIAGNOSIS — R079 Chest pain, unspecified: Secondary | ICD-10-CM | POA: Diagnosis not present

## 2017-03-26 LAB — LIPASE, BLOOD: Lipase: 19 U/L (ref 11–51)

## 2017-03-26 LAB — BRAIN NATRIURETIC PEPTIDE: B Natriuretic Peptide: 792.1 pg/mL — ABNORMAL HIGH (ref 0.0–100.0)

## 2017-03-26 LAB — HEPATIC FUNCTION PANEL
ALT: 14 U/L (ref 14–54)
AST: 20 U/L (ref 15–41)
Albumin: 4.3 g/dL (ref 3.5–5.0)
Alkaline Phosphatase: 115 U/L (ref 38–126)
BILIRUBIN DIRECT: 0.1 mg/dL (ref 0.1–0.5)
Indirect Bilirubin: 0.9 mg/dL (ref 0.3–0.9)
Total Bilirubin: 1 mg/dL (ref 0.3–1.2)
Total Protein: 8.1 g/dL (ref 6.5–8.1)

## 2017-03-26 LAB — BASIC METABOLIC PANEL WITH GFR
Anion gap: 10 (ref 5–15)
BUN: 9 mg/dL (ref 6–20)
CO2: 27 mmol/L (ref 22–32)
Calcium: 9.3 mg/dL (ref 8.9–10.3)
Chloride: 105 mmol/L (ref 101–111)
Creatinine, Ser: 0.66 mg/dL (ref 0.44–1.00)
GFR calc Af Amer: >60 mL/min (ref >60)
GFR calc non Af Amer: >60 mL/min (ref >60)
Glucose, Bld: 104 mg/dL — ABNORMAL HIGH (ref 65–99)
Potassium: 3.2 mmol/L — ABNORMAL LOW (ref 3.5–5.1)
Sodium: 142 mmol/L (ref 135–145)

## 2017-03-26 LAB — CBC
HCT: 41.9 % (ref 36.0–46.0)
Hemoglobin: 13.6 g/dL (ref 12.0–15.0)
MCH: 28.9 pg (ref 26.0–34.0)
MCHC: 32.5 g/dL (ref 30.0–36.0)
MCV: 89.1 fL (ref 78.0–100.0)
Platelets: 174 K/uL (ref 150–400)
RBC: 4.7 MIL/uL (ref 3.87–5.11)
RDW: 14.7 % (ref 11.5–15.5)
WBC: 4.8 K/uL (ref 4.0–10.5)

## 2017-03-26 LAB — I-STAT TROPONIN, ED
Troponin i, poc: 0.02 ng/mL (ref 0.00–0.08)
Troponin i, poc: 0.01 ng/mL (ref 0.00–0.08)

## 2017-03-26 NOTE — ED Notes (Signed)
Ultrasound at bedside

## 2017-03-26 NOTE — ED Triage Notes (Signed)
Pt c/o R rib / chest pain x 3 days that radiates to R upper and lower abdomen and R shoulder blade. Pt states productive cough, small sputum. Pt c/o shortness of breath with activity and at rest.

## 2017-03-26 NOTE — ED Notes (Signed)
ED Provider at bedside. 

## 2017-03-26 NOTE — ED Provider Notes (Signed)
[facility] DEPT Provider Note   CSN: [id number] Arrival date & time: 03/26/17  1130     History   Chief Complaint Chief Complaint  Patient presents with  . Abdominal Pain  . Chest Pain    HPI Donna Herrera is a 71 y.o. female.  71 yo F with a chief complaint of right upper back pain that radiates to her abdomen and her shoulder. Going on for the past week or so. Worse with lying back flat or movement or palpation. Denies worsening with eating. Nothing seems to make it better. She has a history of heart failure and feels that she's had to sit up little higher in bed compared to prior weeks. Denies any change in her medications. Denies noncompliance.     The history is provided by the patient.  Abdominal Pain   This is a new problem. The current episode started more than 2 days ago. The problem occurs constantly. The problem has not changed since onset.The pain is located in the RUQ. The quality of the pain is sharp and shooting. The pain is at a severity of 9/10. The pain is severe. Pertinent negatives include fever, nausea, vomiting, dysuria, headaches, arthralgias and myalgias. The symptoms are aggravated by certain positions, deep breathing and activity. Nothing relieves the symptoms.  Chest Pain   Associated symptoms include abdominal pain and back pain. Pertinent negatives include no dizziness, no fever, no headaches, no nausea, no palpitations, no shortness of breath and no vomiting.    Past Medical History:  Diagnosis Date  . Allergy   . Arthritis   . Chronic combined systolic and diastolic CHF (congestive heart failure) ([place]) 10/21/2015   A. Echo 3/16: EF 40%, diffuse HK, mild MR, moderate LAE, mild RAE, PASP 42 mmHg  //  B. Echo 12/16: Mild LVH, EF 35-40%, diffuse HK, grade 2 diastolic dysfunction, MAC, mild MR, moderate LAE, normal RVSF, PASP 49 mmHg  . Chronic pain   . GERD (gastroesophageal reflux disease)   . Gout   . H/O slipped capital femoral  epiphysis (SCFE)   . Hyperlipidemia   . Hypertension   . Hypertensive heart disease with CHF (congestive heart failure) ([place]) 10/12/2012  . NICM (nonischemic cardiomyopathy) ([place]) 06/09/2016   A. LHC 7/17: Normal coronary arteries, EF 30-35%, LVEDP 14 mmHg    Patient Active Problem List   Diagnosis Date Noted  . Joint inflammation 03/06/2017  . Depression 08/25/2016  . NICM (nonischemic cardiomyopathy) ([place]) 06/09/2016  . Chronic combined systolic and diastolic CHF (congestive heart failure) ([place]) 10/21/2015  . Chronic lung disease   . Flank pain, right 10/03/2015  . Numbness of fingers of both hands 03/20/2015  . Dyspnea 01/16/2015  . Dyshidrotic eczema 12/31/2014  . Vitamin D deficiency 12/31/2014  . Tobacco abuse 09/29/2014  . Mass of breast, left 07/21/2014  . Hyperlipidemia 09/16/2013  . Chronic left shoulder pain 04/10/2013  . Right hip pain 11/02/2012  . Hypertensive heart disease with CHF (congestive heart failure) ([place]) 10/12/2012  . DJD (degenerative joint disease) 10/12/2012  . Gout 10/12/2012  . GERD (gastroesophageal reflux disease) 10/12/2012    Past Surgical History:  Procedure Laterality Date  . CARDIAC CATHETERIZATION  2005   normal-[place]  . CARDIAC CATHETERIZATION N/A 05/11/2016   Procedure: Right/Left Heart Cath and Coronary Angiography;  Surgeon: Peter M Martinique, MD;  Location: [facility];  Service: Cardiovascular;  Laterality: N/A;  . CESAREAN SECTION    . HIP FUSION     left   .  [place]  . ROTATOR CUFF REPAIR     bilateral  . TOTAL HIP ARTHROPLASTY  1987   right. Dr. Collier Salina  . TUBAL LIGATION      OB History    No data available       Home Medications    Prior to Admission medications   Medication Sig Start Date End Date Taking? Authorizing Provider  aspirin 81 MG tablet Take 1 tablet (81 mg total) by mouth daily. 04/16/15   Leeanne Rio, MD  atorvastatin (LIPITOR) 10 MG tablet Take  1 tablet (10 mg total) by mouth daily. 12/14/16   McKeag, Marylynn Pearson, MD  carvedilol (COREG) 6.25 MG tablet Take 1 tablet (6.25 mg total) by mouth 2 (two) times daily. 06/09/16   [name] T, PA-C  cetirizine (ZYRTEC) 10 MG tablet TAKE 1 TABLET (10 MG TOTAL) BY MOUTH DAILY. 03/06/17   McKeag, Marylynn Pearson, MD  colchicine 0.6 MG tablet Take 2 tablets once. Then take 1 tablet daily after that as needed for Gouty flare. 02/27/17   McKeag, Marylynn Pearson, MD  cyclobenzaprine (FLEXERIL) 10 MG tablet Take 1 tablet (10 mg total) by mouth 2 (two) times daily as needed for muscle spasms. 03/06/17   McKeag, Marylynn Pearson, MD  FLUoxetine (PROZAC) 20 MG tablet Take 1 tablet (20 mg total) by mouth daily. 12/14/16   McKeag, Marylynn Pearson, MD  furosemide (LASIX) 20 MG tablet TAKE 1 TABLET (20 MG TOTAL) BY MOUTH DAILY. 03/06/17   McKeag, Marylynn Pearson, MD  HYDROcodone-acetaminophen (NORCO/VICODIN) 5-325 MG tablet Take 1 tablet by mouth 2 (two) times daily as needed for moderate pain. 03/06/17   McKeag, Marylynn Pearson, MD  losartan (COZAAR) 100 MG tablet Take 1 tablet (100 mg total) by mouth daily. 03/06/17   McKeag, Marylynn Pearson, MD  pantoprazole (PROTONIX) 20 MG tablet TAKE 2 TABLETS (40 MG TOTAL) BY MOUTH DAILY. 07/07/16   McKeag, Marylynn Pearson, MD  spironolactone (ALDACTONE) 25 MG tablet Take 0.5 tablets (12.5 mg total) by mouth daily. 07/21/16   Fay Records, MD  triamcinolone ointment (KENALOG) 0.5 % Apply 1 application topically 2 (two) times daily. To palms 12/14/16   McKeag, Marylynn Pearson, MD    Family History Family History  Problem Relation Age of Onset  . Depression Mother   . Diabetes Mother   . Hypertension Mother   . Stroke Mother   . Heart disease Mother   . Cancer Father        unknown primary  . Diabetes Sister   . Breast cancer Neg Hx     Social History Social History  Substance Use Topics  . Smoking status: Never Smoker  . Smokeless tobacco: Current User     Comment: decreased smoking/1 pack every 3 days  . Alcohol use Yes     Comment: on holidays     Allergies     Crab [shellfish allergy] and Morphine and related   Review of Systems Review of Systems  Constitutional: Negative for chills and fever.  HENT: Negative for congestion and rhinorrhea.   Eyes: Negative for redness and visual disturbance.  Respiratory: Negative for shortness of breath and wheezing.   Cardiovascular: Negative for chest pain and palpitations.  Gastrointestinal: Positive for abdominal pain. Negative for nausea and vomiting.  Genitourinary: Negative for dysuria and urgency.  Musculoskeletal: Positive for back pain. Negative for arthralgias and myalgias.  Skin: Negative for pallor and wound.  Neurological: Negative for dizziness and headaches.  Physical Exam Updated Vital Signs BP (!) 152/94   Pulse 65   Temp 98.1 F (36.7 C) (Oral)   Resp 18   SpO2 100%   Physical Exam  Constitutional: She is oriented to person, place, and time. She appears well-developed and well-nourished. No distress.  HENT:  Head: Normocephalic and atraumatic.  Eyes: EOM are normal. Pupils are equal, round, and reactive to light.  Neck: Normal range of motion. Neck supple.  Cardiovascular: Normal rate and regular rhythm.  Exam reveals no gallop and no friction rub.   No murmur heard. Pulmonary/Chest: Effort normal. She has no wheezes. She has no rales.  Abdominal: Soft. She exhibits no distension and no mass. There is tenderness (focally tender in the right upper quadrant.positive Murphy sign). There is no guarding.  Musculoskeletal: She exhibits no edema or tenderness.  Left lower extremity larger than right chronic finding per patient.  Neurological: She is alert and oriented to person, place, and time.  Skin: Skin is warm and dry. She is not diaphoretic.  Psychiatric: She has a normal mood and affect. Her behavior is normal.  Nursing note and vitals reviewed.    ED Treatments / Results  Labs (all labs ordered are listed, but only abnormal results are displayed) Labs Reviewed   BASIC METABOLIC PANEL - Abnormal; Notable for the following:       Result Value   Potassium 3.2 (*)    Glucose, Bld 104 (*)    All other components within normal limits  BRAIN NATRIURETIC PEPTIDE - Abnormal; Notable for the following:    B Natriuretic Peptide 792.1 (*)    All other components within normal limits  CBC  HEPATIC FUNCTION PANEL  LIPASE, BLOOD  I-STAT TROPOININ, ED  I-STAT TROPOININ, ED    EKG  EKG Interpretation  Date/Time:  Sunday Mar 26 2017 12:22:48 EDT Ventricular Rate:  68 PR Interval:    QRS Duration: 104 QT Interval:  469 QTC Calculation: 499 R Axis:   -2 Text Interpretation:  Sinus rhythm Probable left ventricular hypertrophy Borderline prolonged QT interval No significant change since last tracing Confirmed by Deno Etienne [phone]) on 03/26/2017 1:30:24 PM       Radiology Dg Chest 2 View  Result Date: 03/26/2017 CLINICAL DATA:  Right chest pain EXAM: CHEST  2 VIEW COMPARISON:  CTA chest dated 10/04/2015 FINDINGS: Lungs are clear.  No pleural effusion or pneumothorax. The heart is normal in size. Mild degenerative changes of the visualized thoracolumbar spine. IMPRESSION: No evidence of acute cardiopulmonary disease. Electronically Signed   By: Julian Hy M.D.   On: 03/26/2017 12:41   US Abdomen Limited Ruq  Result Date: 03/26/2017 CLINICAL DATA:  Right upper quadrant abdominal pain for 3 days. History of hypertension and GERD. EXAM: US ABDOMEN LIMITED - RIGHT UPPER QUADRANT COMPARISON:  None. FINDINGS: Gallbladder: No gallstones or wall thickening visualized. No sonographic Murphy sign noted by sonographer. Common bile duct: Diameter: Distended to 10 mm. No bile duct stone seen, however, distal CBD is obscured by overlying bowel gas. Liver: Some portions of the liver are obscured by overlying bowel gas. No focal lesion identified. Within normal limits in parenchymal echogenicity. IMPRESSION: 1. Common bile duct is distended to 10 mm. No bile duct stone  identified, however, distal CBD is obscured by overlying bowel gas. Recommend correlation with LFTs and consider further characterization with ERCP or MRCP. 2. Gallbladder appears normal. 3. Liver is unremarkable, partially obscured by overlying bowel gas. Electronically Signed   By: Cherlynn Kaiser  Enriqueta Shutter M.D.   On: 03/26/2017 13:21    Procedures Procedures (including critical care time)  Medications Ordered in ED Medications - No data to display   Initial Impression / Assessment and Plan / ED Course  I have reviewed the triage vital signs and the nursing notes.  Pertinent labs & imaging results that were available during my care of the patient were reviewed by me and considered in my medical decision making (see chart for details).     71 yo F With a chief complaints of right upper back pain. Some concern for cholecystitis as the pain goes from her right upper quadrant into her shoulder. Pain in the right upper quadrant on exam. The patient's symptoms however not related to eating. They're reproduced with palpation of the right lateral rib angles about 8-10. I suspect this is a musculoskeletal pain. The patient later admits to lifting some heavy boxes earlier prior to the pains onset. Delta trop labs, RUQ [name].   RUQ [name] with no gallbladder disease... There is some concern for enlarged common bile duct. Feel the patient's pain is most likely musculoskeletal thoracic pain. Especially with his history of heavy lifting. With the gallbladder completely unaffected and no ability elevation I'll have her get this followed up as an outpatient.  4:57 PM:  I have discussed the diagnosis/risks/treatment options with the patient and family and believe the pt to be eligible for discharge home to follow-up with PCP. We also discussed returning to the ED immediately if new or worsening sx occur. We discussed the sx which are most concerning (e.g., sudden worsening pain, fever, inability to tolerate by mouth) that  necessitate immediate return. Medications administered to the patient during their visit and any new prescriptions provided to the patient are listed below.  Medications given during this visit Medications - No data to display   The patient appears reasonably screen and/or stabilized for discharge and I doubt any other medical condition or other [facility] requiring further screening, evaluation, or treatment in the ED at this time prior to discharge.    Final Clinical Impressions(s) / ED Diagnoses   Final diagnoses:  RUQ abdominal pain  Acute right-sided thoracic back pain    New Prescriptions Discharge Medication List as of 03/26/2017  3:38 PM       Deno Etienne, DO 03/26/17 1657

## 2017-03-27 ENCOUNTER — Other Ambulatory Visit: Payer: Self-pay | Admitting: Family Medicine

## 2017-04-27 ENCOUNTER — Ambulatory Visit (INDEPENDENT_AMBULATORY_CARE_PROVIDER_SITE_OTHER): Payer: Medicare Other | Admitting: Family Medicine

## 2017-04-27 ENCOUNTER — Encounter: Payer: Self-pay | Admitting: Family Medicine

## 2017-04-27 DIAGNOSIS — R109 Unspecified abdominal pain: Secondary | ICD-10-CM

## 2017-04-27 DIAGNOSIS — Z720 Tobacco use: Secondary | ICD-10-CM | POA: Diagnosis not present

## 2017-04-27 NOTE — Progress Notes (Signed)
   HPI  CC: Smoking cessation and hospital follow-up Patient is here wanting to discuss her smoking cessation. She is a 50+ pack year smoker. She states that she stop smoking completely at the beginning of the month and is very pleased so far. She states that she continues to have some cravings but has been able to use nicotine replacement gum to help her through those times. She endorses improved energy and has been more active with her grandchildren. Her only setback at this time is that she does endorse increased sputum production and thick mucus. She states that this occurs most often first thing in the morning. She denies any hemoptysis. No night sweats, weight loss, vision changes, chest pain, shortness of breath, nausea, vomiting, or diarrhea.  Hospital follow-up: Patient states that she was seen in the ED due to thoracic wall and abdominal pain. Since discharge she has had complete resolution in these symptoms. She overall feels well. Looking through the results of that hospital visit her abdominal ultrasound showed some dilation of her common bile duct.  Review of Systems See HPI for ROS.   CC, SH/smoking status, and VS noted  Objective: BP 128/68   Pulse 72   Temp 98.5 F (36.9 C) (Oral)   Ht 5\' 6"  (1.676 m)   Wt 180 lb (81.6 kg)   SpO2 98%   BMI 29.05 kg/m  Gen: NAD, alert, cooperative, and pleasant. CV: RRR, no murmur Resp: CTAB, no wheezes, non-labored Abd: SNTND, BS present Ext: No edema, warm   Assessment and plan:  Tobacco abuse Improved: Patient is a 50+ pack year smoker. She states that she was able to quit cold [place] using nicotine replacement at the beginning of the month. She has had good results since but is now having thick mucus at this time. No other symptoms at this time. - Reassured and congratulated patient. - Strong encouragement to continue. - Patient will need low-dose CT for lung cancer screening this year. I attempted to schedule this while  patient was here today but patient stated that she did not have time to wait for scheduling.  Flank pain, right Result: Patient is following up from a hospital visit. She states that her symptoms have completely resolved. No issues at this time. - Looking over her abdominal ultrasound results I would have a low threshold to obtain liver function tests in the future if she has recurrent symptoms.   Elberta Leatherwood, MD,MS,  PGY3 04/27/2017 11:58 AM

## 2017-04-27 NOTE — Assessment & Plan Note (Signed)
Improved: Patient is a 50+ pack year smoker. She states that she was able to quit cold [place] using nicotine replacement at the beginning of the month. She has had good results since but is now having thick mucus at this time. No other symptoms at this time. - Reassured and congratulated patient. - Strong encouragement to continue. - Patient will need low-dose CT for lung cancer screening this year. I attempted to schedule this while patient was here today but patient stated that she did not have time to wait for scheduling.

## 2017-04-27 NOTE — Assessment & Plan Note (Signed)
Result: Patient is following up from a hospital visit. She states that her symptoms have completely resolved. No issues at this time. - Looking over her abdominal ultrasound results I would have a low threshold to obtain liver function tests in the future if she has recurrent symptoms.

## 2017-05-03 ENCOUNTER — Other Ambulatory Visit: Payer: Self-pay | Admitting: Family Medicine

## 2017-05-09 NOTE — Progress Notes (Addendum)
Office Visit Note  Patient: Donna Herrera             Date of Birth: Oct 04, 1946           MRN: [id number]             PCP: Elberta Leatherwood, MD Referring: Elberta Leatherwood, MD Visit Date: 05/11/2017 Occupation: _0 @    Subjective:  Pain in multiple joints.   History of Present Illness: Donna Herrera is a 71 y.o. female seen in consultation per request of her PCP. She states she has a history of joint pain for multiple years. She recalls having a fall when she was 71 years old. She had surgery on her right hip. At age 32 she had left hip joint discomfort. 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 She started going to do orthopedics where she had multiple surgeries on bilateral hip joints. She states finally she started going to [place] [facility] and had right total hip replacement which initially did well but now she's having increased discomfort in her right hip. She's also had bilateral total knee replacement over time. She states she has had history of gout for 20 years. She was initially treated with allopurinol and then was switched to colchicine. She's not taking allopurinol and multiple years. She states her gout is very well controlled and she has only occasional flares. She had a flare about a month ago which responded to colchicine. She states her gout is not a concern. She's been having discomfort in her left shoulder having difficulty lifting her left shoulder. She has old contracture in her right elbow. She has discomfort in her neck and lower back. In her right hip which has been replaced. She denies any joint swelling.  Activities of Daily Living:  Patient reports morning stiffness for 15  minutes.   Patient Reports nocturnal pain.  Difficulty dressing/grooming: Reports Difficulty climbing stairs: Reports Difficulty getting out of chair: Reports Difficulty using hands for taps, buttons, cutlery, and/or writing:  Reports   Review of Systems  Constitutional: Positive for fatigue.  HENT: Positive for mouth sores and mouth dryness.   Eyes: Positive for dryness.  Respiratory: Positive for shortness of breath. Negative for cough.   Cardiovascular: Negative.  Negative for palpitations, hypertension and irregular heartbeat.  Gastrointestinal: Negative.  Negative for blood in stool, constipation and diarrhea.  Endocrine: Negative for heat intolerance.  Genitourinary: Negative.  Negative for nocturia.  Musculoskeletal: Positive for arthralgias, joint pain and morning stiffness. Negative for joint swelling, myalgias, muscle weakness and myalgias.  Skin: Negative for color change, rash, hair loss, ulcers and sensitivity to sunlight.  Neurological: Negative.  Negative for headaches.  Hematological: Negative.  Negative for swollen glands.  Psychiatric/Behavioral: Negative for depressed mood and sleep disturbance. The patient is nervous/anxious.     PMFS History:  Patient Active Problem List   Diagnosis Date Noted  . Joint inflammation 03/06/2017  . Depression 08/25/2016  . NICM (nonischemic cardiomyopathy) ([place]) 06/09/2016  . Chronic combined systolic and diastolic CHF (congestive heart failure) ([place]) 10/21/2015  . Chronic lung disease   . Flank pain, right 10/03/2015  . Numbness of fingers of both hands 03/20/2015  . Dyspnea 01/16/2015  . Dyshidrotic eczema 12/31/2014  . Vitamin D deficiency 12/31/2014  . Tobacco abuse 09/29/2014  . Mass of breast, left 07/21/2014  . Hyperlipidemia 09/16/2013  . Chronic left shoulder pain 04/10/2013  . Right hip pain 11/02/2012  . Hypertensive heart disease with CHF (congestive heart  failure) ([place]) 10/12/2012  . DJD (degenerative joint disease) 10/12/2012  . Gout 10/12/2012  . GERD (gastroesophageal reflux disease) 10/12/2012    Past Medical History:  Diagnosis Date  . Allergy   . Arthritis   . Chronic combined systolic and diastolic CHF (congestive heart  failure) ([place]) 10/21/2015   A. Echo 3/16: EF 40%, diffuse HK, mild MR, moderate LAE, mild RAE, PASP 42 mmHg  //  B. Echo 12/16: Mild LVH, EF 35-40%, diffuse HK, grade 2 diastolic dysfunction, MAC, mild MR, moderate LAE, normal RVSF, PASP 49 mmHg  . Chronic pain   . GERD (gastroesophageal reflux disease)   . Gout   . H/O slipped capital femoral epiphysis (SCFE)   . Hyperlipidemia   . Hypertension   . Hypertensive heart disease with CHF (congestive heart failure) ([place]) 10/12/2012  . NICM (nonischemic cardiomyopathy) ([place]) 06/09/2016   A. LHC 7/17: Normal coronary arteries, EF 30-35%, LVEDP 14 mmHg    Family History  Problem Relation Age of Onset  . Depression Mother   . Diabetes Mother   . Hypertension Mother   . Stroke Mother   . Heart disease Mother   . Cancer Father        unknown primary  . Diabetes Sister   . Breast cancer Neg Hx    Past Surgical History:  Procedure Laterality Date  . CARDIAC CATHETERIZATION  2005   normal-Hochrein  . CARDIAC CATHETERIZATION N/A 05/11/2016   Procedure: Right/Left Heart Cath and Coronary Angiography;  Surgeon: Peter M Martinique, MD;  Location: [facility];  Service: Cardiovascular;  Laterality: N/A;  . CESAREAN SECTION    . HIP FUSION     left   . [place]  . ROTATOR CUFF REPAIR     bilateral  . TOTAL HIP ARTHROPLASTY  1987   right. Dr. Collier Salina  . TUBAL LIGATION     Social History   Social History Narrative   Formerly Photographer. Lives with husband, daughter Donna Herrera and grandkids.     Objective: Vital Signs: BP (!) 147/89   Pulse 80   Resp 14   Ht _0  (1.702 m)   Wt 183 lb (83 kg)   BMI 28.66 kg/m    Physical Exam  Constitutional: She is oriented to person, place, and time. She appears well-developed and well-nourished.  HENT:  Head: Normocephalic and atraumatic.  Eyes: Conjunctivae and EOM are normal.  Neck: Normal range of motion.  Cardiovascular: Normal rate, regular  rhythm, normal heart sounds and intact distal pulses.   Pulmonary/Chest: Effort normal and breath sounds normal.  Abdominal: Soft. Bowel sounds are normal.  Lymphadenopathy:    She has no cervical adenopathy.  Neurological: She is alert and oriented to person, place, and time.  Skin: Skin is warm and dry. Capillary refill takes less than 2 seconds.  Psychiatric: She has a normal mood and affect. Her behavior is normal.  Nursing note and vitals reviewed.    Musculoskeletal Exam: C-spine some discomfort range of motion lumbar spine discomfort with range of motion left shoulder joint abduction was limited to about 30 with discomfort. She has about 30 contracture in her right elbow without any synovitis. Wrists joint MCPs PIPs DIPs with good range of motion with no synovitis. Bilateral hip joints have surgical scarring and have limited range of motion. Bilateral knee joints have postsurgical changes from total knee replacement without any warmth swelling or effusion. She has some thickening of her bilateral  ankle joints. She has DIP PIP changes in her feet consistent with osteoarthritis. No tophi were noted. No synovitis was noted.  CDAI Exam: No CDAI exam completed.    Investigation: Findings:  10/04/2015 Uric Acid 7.2   CBC Latest Ref Rng & Units 03/26/2017 05/09/2016 10/06/2015  WBC 4.0 - 10.5 K/uL 4.8 5.1 10.2  Hemoglobin 12.0 - 15.0 g/dL 13.6 13.8 11.6(L)  Hematocrit 36.0 - 46.0 % 41.9 41.8 37.2  Platelets 150 - 400 K/uL 174 244 291   CMP Latest Ref Rng & Units 03/26/2017 07/21/2016 07/14/2016  Glucose 65 - 99 mg/dL 104(H) 122(H) 125(H)  BUN 6 - 20 mg/dL _0 Creatinine 0.44 - 1.00 mg/dL 0.66 0.69 0.72  Sodium 135 - 145 mmol/L 142 141 140  Potassium 3.5 - 5.1 mmol/L 3.2(L) 3.6 3.7  Chloride 101 - 111 mmol/L 105 104 103  CO2 22 - 32 mmol/L _1 Calcium 8.9 - 10.3 mg/dL 9.3 8.7 9.0  Total Protein 6.5 - 8.1 g/dL 8.1 - -  Total Bilirubin 0.3 - 1.2 mg/dL 1.0 - -  Alkaline Phos 38  - 126 U/L 115 - -  AST 15 - 41 U/L 20 - -  ALT 14 - 54 U/L 14 - -    Imaging: Xr Cervical Spine 2 Or 3 Views  Result Date: 05/11/2017 Multilevel spondylosis with severe narrowing between C3-4, C4-5, C5-6, C6-7 and  anterior spurring Impression: Severe disc disease of C-spine  Xr Foot 2 Views Left  Result Date: 05/11/2017 Severe first MTP narrowing all PIP/DIP narrowing without any erosive changes metatarsal tarsal and also joint space narrowing. Calcification noted around the first MTP joint. Impression: These findings are consistent with osteoarthritis and possibly changes from gout in the first MTP joint.  Xr Foot 2 Views Right  Result Date: 05/11/2017 First MTP severe narrowing all PIP/DIP narrowing some metatarsotarsal joint narrowing. Impression: These findings are consistent with osteoarthritis.  Xr Lumbar Spine 2-3 Views  Result Date: 05/11/2017 Multilevel spondylosis with severe narrowing between the L3-4 L4-5 anterolisthesis between L4-5 facet joint arthropathy was noted. Impression multilevel spondylosis and facet joint arthropathy  Xr Shoulder Left  Result Date: 05/11/2017 Severe glenohumeral joint space narrowing, high rising humerus, type II acromion.   Speciality Comments: No specialty comments available.    Procedures:  No procedures performed Allergies: Crab [shellfish allergy] and Morphine and related   Assessment / Plan:     Visit Diagnoses: Idiopathic chronic gout of multiple sites without tophus: Patient has long-standing history of gout. Although she states her gout is not very active. She is only on colchicine. She is not interested in starting on allopurinol. I will check her uric acid level today.  Multiple joint pain - Plan: Uric acid, Sedimentation rate, Rheumatoid factor, Cyclic citrul peptide antibody, IgG  History of total hip replacement, right: She continues to have pain in her right hip joint which is been replaced. I've advised her to follow-up  with orthopedic surgeon.  H/O total knee replacement, bilateral: She's been having discomfort in her bilateral knee joints as well. I've advised her to follow-up with orthopedics.  Chronic left shoulder pain: I will obtain x-ray of the left shoulder, which showed severe glenohumeral joint space narrowing. This explains why she has limited range of motion of her left shoulder  Pain in left ankle and joints of left foot: X-ray of left foot was obtained 2 views: Consistent with severe osteoarthritis  Pain in right ankle and joints of right foot:  X-ray of right foot was obtained 2 views: Consistent with severe osteoarthritis  Neck pain: She's been having a lot of pain and discomfort in her neck. I will obtain x-ray of her C-spine today. Which showed severe multilevel spondylosis  Lumbar pain : X-ray of lumbar spine was obtained 2 views: Severe multilevel spondylosis with L4-5 listhesis  Based on all the x-rays of both and the severe nature of this disease and osteoarthritis I've advised her to schedule an appointment with orthopedic doctor. Once we have lab results available I'll contact her with the lab results. She can come back on when necessary basis only.  Vitamin D deficiency: She states she takes vitamin D supplement.  History of CHF (congestive heart failure)  History of gastroesophageal reflux (GERD)  History of hyperlipidemia  Former smoker    Orders: Orders Placed This Encounter  Procedures  . XR Shoulder Left  . XR Foot 2 Views Left  . XR Foot 2 Views Right  . XR Cervical Spine 2 or 3 views  . XR Lumbar Spine 2-3 Views  . Uric acid  . Sedimentation rate  . Rheumatoid factor  . Cyclic citrul peptide antibody, IgG   No orders of the defined types were placed in this encounter.   Face-to-face time spent with patient was 50 minutes. 50% of time was spent in counseling and coordination of care.  Follow-Up Instructions: Return if symptoms worsen or fail to improve, for  Osteoarthritis, Gout.   Bo Merino, MD  Note - This record has been created using Editor, commissioning.  Chart creation errors have been sought, but may not always  have been located. Such creation errors do not reflect on  the standard of medical care.

## 2017-05-11 ENCOUNTER — Ambulatory Visit (INDEPENDENT_AMBULATORY_CARE_PROVIDER_SITE_OTHER): Payer: Medicare Other

## 2017-05-11 ENCOUNTER — Encounter: Payer: Self-pay | Admitting: Rheumatology

## 2017-05-11 ENCOUNTER — Ambulatory Visit (INDEPENDENT_AMBULATORY_CARE_PROVIDER_SITE_OTHER): Payer: Medicare Other | Admitting: Rheumatology

## 2017-05-11 VITALS — BP 147/89 | HR 80 | Resp 14 | Ht 67.0 in | Wt 183.0 lb

## 2017-05-11 DIAGNOSIS — M1A09X0 Idiopathic chronic gout, multiple sites, without tophus (tophi): Secondary | ICD-10-CM

## 2017-05-11 DIAGNOSIS — G8929 Other chronic pain: Secondary | ICD-10-CM

## 2017-05-11 DIAGNOSIS — M542 Cervicalgia: Secondary | ICD-10-CM | POA: Diagnosis not present

## 2017-05-11 DIAGNOSIS — M2550 Pain in unspecified joint: Secondary | ICD-10-CM

## 2017-05-11 DIAGNOSIS — E559 Vitamin D deficiency, unspecified: Secondary | ICD-10-CM

## 2017-05-11 DIAGNOSIS — Z96653 Presence of artificial knee joint, bilateral: Secondary | ICD-10-CM | POA: Diagnosis not present

## 2017-05-11 DIAGNOSIS — Z87891 Personal history of nicotine dependence: Secondary | ICD-10-CM

## 2017-05-11 DIAGNOSIS — Z8709 Personal history of other diseases of the respiratory system: Secondary | ICD-10-CM | POA: Diagnosis not present

## 2017-05-11 DIAGNOSIS — M25512 Pain in left shoulder: Secondary | ICD-10-CM | POA: Diagnosis not present

## 2017-05-11 DIAGNOSIS — Z96641 Presence of right artificial hip joint: Secondary | ICD-10-CM

## 2017-05-11 DIAGNOSIS — M25572 Pain in left ankle and joints of left foot: Secondary | ICD-10-CM

## 2017-05-11 DIAGNOSIS — Z8639 Personal history of other endocrine, nutritional and metabolic disease: Secondary | ICD-10-CM

## 2017-05-11 DIAGNOSIS — Z8679 Personal history of other diseases of the circulatory system: Secondary | ICD-10-CM | POA: Diagnosis not present

## 2017-05-11 DIAGNOSIS — M545 Low back pain, unspecified: Secondary | ICD-10-CM

## 2017-05-11 DIAGNOSIS — Z8719 Personal history of other diseases of the digestive system: Secondary | ICD-10-CM

## 2017-05-11 DIAGNOSIS — M25571 Pain in right ankle and joints of right foot: Secondary | ICD-10-CM | POA: Diagnosis not present

## 2017-05-11 LAB — URIC ACID: Uric Acid, Serum: 7.9 mg/dL — ABNORMAL HIGH (ref 2.5–7.0)

## 2017-05-12 LAB — RHEUMATOID FACTOR: Rhuematoid fact SerPl-aCnc: <14 IU/mL (ref <14)

## 2017-05-12 LAB — CYCLIC CITRUL PEPTIDE ANTIBODY, IGG: Cyclic Citrullin Peptide Ab: <16 Units

## 2017-05-12 LAB — SEDIMENTATION RATE: Sed Rate: 14 mm/hr (ref 0–30)

## 2017-05-13 NOTE — Progress Notes (Signed)
Uric acid is mildly elevated. She is not having gout flares and is not interested in starting Allopurinol.

## 2017-05-17 ENCOUNTER — Ambulatory Visit (INDEPENDENT_AMBULATORY_CARE_PROVIDER_SITE_OTHER): Payer: Medicare Other | Admitting: Internal Medicine

## 2017-05-17 ENCOUNTER — Encounter: Payer: Self-pay | Admitting: Internal Medicine

## 2017-05-17 DIAGNOSIS — R0602 Shortness of breath: Secondary | ICD-10-CM | POA: Diagnosis not present

## 2017-05-17 MED ORDER — PREDNISONE 50 MG PO TABS: 50.0000 mg | ORAL_TABLET | Freq: Every day | ORAL | 0 refills | Status: DC

## 2017-05-17 MED ORDER — LEVOFLOXACIN 500 MG PO TABS: 500.0000 mg | ORAL_TABLET | Freq: Every day | ORAL | 0 refills | Status: DC

## 2017-05-17 MED ORDER — ALBUTEROL SULFATE HFA 108 (90 BASE) MCG/ACT IN AERS: 2.0000 | INHALATION_SPRAY | Freq: Four times a day (QID) | RESPIRATORY_TRACT | 0 refills | Status: DC | PRN

## 2017-05-17 NOTE — Patient Instructions (Signed)
It was so nice to meet you!  I am concerned that you have an underlying lung condition, such as COPD. I am going to treat you with a steroid called Prednisone. Please take 1 tablet daily for 5 days. I am also going to treat you with an antibiotic called Levaquin. Please take 1 tablet daily for 5 days.  I have prescribed an Albuterol inhaler to help with your breathing. You can use this every 6 hours as needed for shortness of breath.  -Dr. Brett Albino

## 2017-05-18 NOTE — Progress Notes (Signed)
   [facility] Phone: [phone]  Subjective:  Donna Herrera is a 70 year old female presenting to clinic with cough and shortness of breath for the last month. The cough and shortness of breath have been worsening. The cough is productive of large amounts of grey/yellow sputum. She is having intermittent shortness of breath throughout the day, worse with ambulation. She denies any fevers or chills. She endorses mild lower extremity edema, but states it is not worse than normal. She stopped smoking in June, but previously smoked 1/2 pack per day for "many, many years". She has not been diagnosed with COPD, but has been told int he past that one of her imaging studies showed possible COPD. No orthopnea.  ROS: See HPI for pertinent positives and negatives  Past Medical History- combined systolic and diastolic CHF, non-ischemic cardiomyopathy, GERD, depression, HLD  Family history reviewed for today's visit. No changes.  Social history- patient is a former smoker. Quit 1 month ago  Objective: BP 135/74   Pulse 62   Temp 99.2 F (37.3 C) (Oral)   Ht 5\' 6"  (1.676 m)   Wt 178 lb (80.7 kg)   SpO2 95%   BMI 28.73 kg/m  Gen: NAD, alert, cooperative with exam HEENT: NCAT, EOMI, MMM Neck: FROM, supple, no cervical lympadenopathy CV: RRR, no murmur Resp: Normal work of breathing, diffuse expiratory wheezing present, moderately decreased air movement throughout all lung fields. Msk: 1+ symmetric pitting edema to the mid shin  Assessment/Plan: Shortness of breath Think this is likely COPD exacerbation (although Pt does not have a formal diagnosis of COPD, she does have changes consistent with COPD on CTA chest in 2016 and a very long smoking history). Given her shortness of breath, increased cough, and increased sputum production, will treat as COPD exacerbation. O2 saturation is 95% and patient has normal work of breathing, so do not think she warrants hospital admission. CHF  exacerbation is also a possibility, but patient does not have bibasilar crackles, her lower extremity edema is unchanged, her weight is down from previous, and she does denies orthopnea. CAP less likely without fevers, chills, or focal lung findings. No risk factors for PE and wells score is 0. - Will treat for COPD exacerbation with Prednisone x 5 days and Levaquin x 5 days (per up-to-date, Levaquin is the 1st preferred antibiotic for patients > 43 years old) - Will also prescribe Albuterol prn for shortness of breath - Recommend PFTs when able to formally diagnose COPD - Encouraged continued smoking cessation - Follow-up with PCP in 5 days after completion of Prednisone/Levaquin course to ensure that SOB is improving   Hyman Bible, MD PGY-3

## 2017-05-18 NOTE — Assessment & Plan Note (Signed)
Think this is likely COPD exacerbation (although Pt does not have a formal diagnosis of COPD, she does have changes consistent with COPD on CTA chest in 2016 and a very long smoking history). Given her shortness of breath, increased cough, and increased sputum production, will treat as COPD exacerbation. O2 saturation is 95% and patient has normal work of breathing, so do not think she warrants hospital admission. CHF exacerbation is also a possibility, but patient does not have bibasilar crackles, her lower extremity edema is unchanged, her weight is down from previous, and she does denies orthopnea. CAP less likely without fevers, chills, or focal lung findings. No risk factors for PE and wells score is 0. - Will treat for COPD exacerbation with Prednisone x 5 days and Levaquin x 5 days (per up-to-date, Levaquin is the 1st preferred antibiotic for patients > 32 years old) - Will also prescribe Albuterol prn for shortness of breath - Recommend PFTs when able to formally diagnose COPD - Encouraged continued smoking cessation - Follow-up with PCP in 5 days after completion of Prednisone/Levaquin course to ensure that SOB is improving

## 2017-05-22 ENCOUNTER — Encounter (HOSPITAL_COMMUNITY): Payer: Self-pay

## 2017-05-22 ENCOUNTER — Ambulatory Visit (HOSPITAL_COMMUNITY)
Admission: EM | Admit: 2017-05-22 | Discharge: 2017-05-22 | Disposition: A | Discharge status: Home / Self Care | Payer: Medicare Other | Attending: Family Medicine | Admitting: Family Medicine

## 2017-05-22 DIAGNOSIS — M79662 Pain in left lower leg: Secondary | ICD-10-CM

## 2017-05-22 DIAGNOSIS — M7662 Achilles tendinitis, left leg: Secondary | ICD-10-CM

## 2017-05-22 DIAGNOSIS — R0789 Other chest pain: Secondary | ICD-10-CM

## 2017-05-22 NOTE — ED Triage Notes (Signed)
Pt said this morning she was experiencing  Chest pain this morning sort of like "a ache/cramp" and said that's better and now said she was having pain behind her left foot and said the pain is radiating up her leg behind her calf. She is using crutches to walk and never had to use them before. Did speak to the pharmacist and he recommended her to come to Urgent Care. Thought it was due to the antibiotic she was on "levoflaxcin" was seen by her pcp a few days ago and was given that medication for a "lung infection"

## 2017-05-22 NOTE — ED Provider Notes (Signed)
CSN: [id number]     Arrival date & time 05/22/17  1753 History   None    Chief Complaint  Patient presents with  . Leg Pain   (Consider location/radiation/quality/duration/timing/severity/associated sxs/prior Treatment) Patient c/o having sudden onset of left leg and calf pain and has difficulty walking now.  She has been taking Levaquin x 5 days for pneumonia.  She had some chest discomfort earlier this morning and it has resolved.  She reports pain when pressing on her chest wall.      The history is provided by the patient and a relative.  Leg Pain  Location:  Leg Injury: no   Leg location:  L leg and L lower leg Pain details:    Quality:  Aching and burning   Radiates to:  L leg   Severity:  Moderate   Onset quality:  Sudden   Duration:  1 day   Timing:  Constant   Progression:  Worsening Chronicity:  New   Past Medical History:  Diagnosis Date  . Allergy   . Arthritis   . Chronic combined systolic and diastolic CHF (congestive heart failure) ([place]) 10/21/2015   A. Echo 3/16: EF 40%, diffuse HK, mild MR, moderate LAE, mild RAE, PASP 42 mmHg  //  B. Echo 12/16: Mild LVH, EF 35-40%, diffuse HK, grade 2 diastolic dysfunction, MAC, mild MR, moderate LAE, normal RVSF, PASP 49 mmHg  . Chronic pain   . GERD (gastroesophageal reflux disease)   . Gout   . H/O slipped capital femoral epiphysis (SCFE)   . Hyperlipidemia   . Hypertension   . Hypertensive heart disease with CHF (congestive heart failure) ([place]) 10/12/2012  . NICM (nonischemic cardiomyopathy) ([place]) 06/09/2016   A. LHC 7/17: Normal coronary arteries, EF 30-35%, LVEDP 14 mmHg   Past Surgical History:  Procedure Laterality Date  . CARDIAC CATHETERIZATION  2005   normal-Hochrein  . CARDIAC CATHETERIZATION N/A 05/11/2016   Procedure: Right/Left Heart Cath and Coronary Angiography;  Surgeon: Peter M Martinique, MD;  Location: [facility];  Service: Cardiovascular;  Laterality: N/A;  . CESAREAN SECTION    . HIP FUSION      left   . [place]  . ROTATOR CUFF REPAIR     bilateral  . TOTAL HIP ARTHROPLASTY  1987   right. Dr. Collier Salina  . TUBAL LIGATION     Family History  Problem Relation Age of Onset  . Depression Mother   . Diabetes Mother   . Hypertension Mother   . Stroke Mother   . Heart disease Mother   . Cancer Father        unknown primary  . Diabetes Sister   . Breast cancer Neg Hx    Social History  Substance Use Topics  . Smoking status: Former Smoker    Packs/day: 0.50  . Smokeless tobacco: Current User     Comment: decreased smoking/1 pack every 3 days  . Alcohol use Yes     Comment: on holidays   OB History    No data available     Review of Systems  Constitutional: Negative.   HENT: Negative.   Eyes: Negative.   Respiratory: Negative.   Cardiovascular: Negative.   Gastrointestinal: Negative.   Endocrine: Negative.   Genitourinary: Negative.   Musculoskeletal: Positive for arthralgias.  Allergic/Immunologic: Negative.   Neurological: Negative.   Hematological: Negative.   Psychiatric/Behavioral: Negative.     Allergies  Crab [shellfish allergy] and  Morphine and related  Home Medications   Prior to Admission medications   Medication Sig Start Date End Date Taking? Authorizing Provider  albuterol (PROVENTIL HFA;VENTOLIN HFA) 108 (90 Base) MCG/ACT inhaler Inhale 2 puffs into the lungs every 6 (six) hours as needed for wheezing or shortness of breath. 05/17/17  Yes Mayo, Pete Pelt, MD  aspirin 81 MG tablet Take 1 tablet (81 mg total) by mouth daily. 04/16/15  Yes Leeanne Rio, MD  atorvastatin (LIPITOR) 10 MG tablet Take 1 tablet (10 mg total) by mouth daily. 12/14/16  Yes McKeag, Marylynn Pearson, MD  carvedilol (COREG) 6.25 MG tablet Take 1 tablet (6.25 mg total) by mouth 2 (two) times daily. 06/09/16  Yes Weaver, Scott T, PA-C  cetirizine (ZYRTEC) 10 MG tablet TAKE 1 TABLET (10 MG TOTAL) BY MOUTH DAILY. 03/06/17  Yes McKeag, Marylynn Pearson, MD   colchicine 0.6 MG tablet Take 1 tablet (0.6 mg total) by mouth daily as needed. Take 1 tablet daily as needed for Gouty flare. 05/04/17  Yes Rogue Bussing, MD  cyclobenzaprine (FLEXERIL) 10 MG tablet Take 1 tablet (10 mg total) by mouth 2 (two) times daily as needed for muscle spasms. 03/06/17  Yes McKeag, Marylynn Pearson, MD  FLUoxetine (PROZAC) 20 MG tablet Take 1 tablet (20 mg total) by mouth daily. 12/14/16  Yes McKeag, Marylynn Pearson, MD  furosemide (LASIX) 20 MG tablet TAKE 1 TABLET (20 MG TOTAL) BY MOUTH DAILY. 03/06/17  Yes McKeag, Marylynn Pearson, MD  HYDROcodone-acetaminophen (NORCO/VICODIN) 5-325 MG tablet Take 1 tablet by mouth 2 (two) times daily as needed for moderate pain. 03/06/17  Yes McKeag, Marylynn Pearson, MD  levofloxacin (LEVAQUIN) 500 MG tablet Take 1 tablet (500 mg total) by mouth daily. 05/17/17  Yes Mayo, Pete Pelt, MD  losartan (COZAAR) 100 MG tablet Take 1 tablet (100 mg total) by mouth daily. 03/06/17  Yes McKeag, Marylynn Pearson, MD  pantoprazole (PROTONIX) 20 MG tablet TAKE 2 TABLETS (40 MG TOTAL) BY MOUTH DAILY. 07/07/16  Yes McKeag, Marylynn Pearson, MD  predniSONE (DELTASONE) 50 MG tablet Take 1 tablet (50 mg total) by mouth daily with breakfast. 05/17/17  Yes Mayo, Pete Pelt, MD  spironolactone (ALDACTONE) 25 MG tablet Take 0.5 tablets (12.5 mg total) by mouth daily. 07/21/16  Yes Fay Records, MD  triamcinolone ointment (KENALOG) 0.5 % Apply 1 application topically 2 (two) times daily. To palms 12/14/16  Yes McKeag, Marylynn Pearson, MD   Meds Ordered and Administered this Visit  Medications - No data to display  BP (!) 149/90 (BP Location: Left Arm)   Pulse 84   Temp 99.1 F (37.3 C) (Oral)   Resp 18   SpO2 98%  No data found.   Physical Exam  Constitutional: She appears well-developed and well-nourished.  HENT:  Head: Normocephalic and atraumatic.  Eyes: Pupils are equal, round, and reactive to light. Conjunctivae and EOM are normal.  Neck: Normal range of motion. Neck supple.  Cardiovascular: Normal rate, regular rhythm  and normal heart sounds.   Pulmonary/Chest: Effort normal and breath sounds normal.  Musculoskeletal: She exhibits tenderness.  TTP left achilles tendon.  Negative Thompson Test Left Calf.  TTP anterior chest/ intercostal space.  Nursing note and vitals reviewed.   Urgent Care Course     Procedures (including critical care time)  Labs Review Labs Reviewed - No data to display  Imaging Review No results found.   Visual Acuity Review  Right Eye Distance:   Left Eye Distance:   Bilateral  Distance:    Right Eye Near:   Left Eye Near:    Bilateral Near:         MDM   1. Pain of left calf   2. Tendonitis, Achilles, left   3. Chest wall pain    Cam Walker Left lower extremity Referral to Orthopedics  Take pain meds from home.  Stop levaquin  If chest pain returns go to ED      Lysbeth Penner, FNP 05/22/17 2000

## 2017-05-22 NOTE — Discharge Instructions (Signed)
Stop taking Levaquin antibiotic.  Get off left foot.  Wear orthopedic boot left lower extremity.  If having any chest pain call 911 or go to ED.  Take pain medicine at home for Left leg and ankle pain.

## 2017-05-23 ENCOUNTER — Other Ambulatory Visit: Payer: Self-pay

## 2017-05-23 ENCOUNTER — Encounter: Payer: Self-pay | Admitting: Internal Medicine

## 2017-05-23 ENCOUNTER — Ambulatory Visit (HOSPITAL_COMMUNITY)
Admission: RE | Admit: 2017-05-23 | Discharge: 2017-05-23 | Disposition: A | Discharge status: Home / Self Care | Payer: Medicare Other | Source: Ambulatory Visit | Attending: Family Medicine | Admitting: Family Medicine

## 2017-05-23 ENCOUNTER — Ambulatory Visit (INDEPENDENT_AMBULATORY_CARE_PROVIDER_SITE_OTHER): Payer: Medicare Other | Admitting: Internal Medicine

## 2017-05-23 VITALS — BP 144/82 | HR 80 | Temp 98.5°F | Wt 183.0 lb

## 2017-05-23 DIAGNOSIS — M7662 Achilles tendinitis, left leg: Secondary | ICD-10-CM

## 2017-05-23 DIAGNOSIS — R9431 Abnormal electrocardiogram [ECG] [EKG]: Secondary | ICD-10-CM | POA: Insufficient documentation

## 2017-05-23 DIAGNOSIS — R05 Cough: Secondary | ICD-10-CM

## 2017-05-23 DIAGNOSIS — R252 Cramp and spasm: Secondary | ICD-10-CM | POA: Diagnosis not present

## 2017-05-23 DIAGNOSIS — R059 Cough, unspecified: Secondary | ICD-10-CM

## 2017-05-23 DIAGNOSIS — R0602 Shortness of breath: Secondary | ICD-10-CM | POA: Diagnosis not present

## 2017-05-23 NOTE — Patient Instructions (Addendum)
Ms. Deremer,   You do not sound fluid overloaded on exam and your EKG is unchanged.  I think you are having a muscle spasm--try your home flexeril or tylenol as needed.  I would start with increased pillows or a foam wedge for sleeping.   Please make an appointment with Dr. Valentina Lucks next month for lung function testing.  Best, Dr. Ola Spurr

## 2017-05-24 DIAGNOSIS — R252 Cramp and spasm: Secondary | ICD-10-CM | POA: Insufficient documentation

## 2017-05-24 DIAGNOSIS — M7662 Achilles tendinitis, left leg: Secondary | ICD-10-CM

## 2017-05-24 HISTORY — DX: Achilles tendinitis, left leg: M76.62

## 2017-05-24 LAB — BASIC METABOLIC PANEL
BUN / CREAT RATIO: 22 (ref 12–28)
BUN: 18 mg/dL (ref 8–27)
CALCIUM: 9.1 mg/dL (ref 8.7–10.3)
CHLORIDE: 102 mmol/L (ref 96–106)
CO2: 24 mmol/L (ref 20–29)
Creatinine, Ser: 0.83 mg/dL (ref 0.57–1.00)
GFR calc non Af Amer: 71 mL/min/{1.73_m2} (ref >59)
GFR, EST AFRICAN AMERICAN: 82 mL/min/{1.73_m2} (ref >59)
Glucose: 106 mg/dL — ABNORMAL HIGH (ref 65–99)
POTASSIUM: 3.5 mmol/L (ref 3.5–5.2)
SODIUM: 145 mmol/L — AB (ref 134–144)

## 2017-05-24 NOTE — Assessment & Plan Note (Addendum)
-   Chest pain resolved during visit and reproducible. Not associated with activity but is associated with position changes. - EKG unchanged from previous. Reviewed with preceptor Dr. Erin Hearing. Had recent cardiac catheterization, as well, that was negative for CAD.  - Will obtain BMP to look for electrolyte abnormalities that could be causing cramping (could have hypokalemia as on lasix) - Advised patient to take tylenol and home muscle relaxant prn - Return precautions given

## 2017-05-24 NOTE — Assessment & Plan Note (Addendum)
-   Able to dorsi and plantar flex, limiting concern for complete tendon rupture. - Recommend patient rest and ice area. Stop if an activity brings pain. - Continue cam walker and crutches. - Patient requests to see her old PCP, who she knows is now a Sports Medicine fellow, for follow-up. Placed referral, as would benefit from reviewing rehab exercises and could be monitored for improvement with [name].

## 2017-05-24 NOTE — Progress Notes (Signed)
[facility] Progress Note  Subjective:  Donna Herrera is a 71 y.o. female with history of DCM with EF 35-40%, possible COPD, DJD and HLD who presents to follow-up cough and L ankle pain. Also with chest pain.   #Cough: - Patient seen for possible COPD exacerbation 05/17/17 and completed 5 day course of steroids and prednisone. - No longer with cough. - Says breathing has improved greatly since quitting cigarettes earlier this year. - Has not used albuterol inhaler prescribed at last visit. - Has not had formal PFTs. ROS: No fevers, no increased SOB  #Left leg pain: - Seen in ED 05/22/17 for left leg pain. Thought to have Achilles tendonitis possibly from recent fluoroquinolone exposure.  - Given Cam Walker and told to follow-up with Orthopedics - Patient chronically walks with crutches because "joints don't bend" - Hurts to walk more - No falls or known injury  #Chest pain: - Just began in office. Happened earlier this week. - Feels like a muscle spasm to patient, not like pressure. Affected by position.  - No associated n/v, diaphoresis or SOB - Denies GERD symptoms - Says not brought on by cough and that cough has resolved - Had cardiac catheterization 04/2016 that demonstrated no CAD; does have significant left ventricular dysfunction  Social: Former smoker  Objective: Blood pressure (!) 144/82, pulse 80, temperature 98.5 F (36.9 C), temperature source Oral, weight 183 lb (83 kg), SpO2 97 %. Body mass index is 29.54 kg/m. Constitutional: Overweight, elderly female in NAD Cardiovascular: RRR, S1, S2, no m/r/g.  Pulmonary/Chest: Effort normal and breath sounds normal. No respiratory distress.  Abdominal: Soft. +BS, NT, ND Musculoskeletal: TTP over sternum. Exactly reproduces pain. TTP over L Achilles tendon, especially about 2 cm above ankle joint. Hurts somewhat to dorsiflex. Strength 5/5 with dorsi and plantar flexion on left.  Psychiatric: Normal  mood and affect.  Vitals reviewed  Assessment/Plan: Cough - Resolved. - Recommended PFT testing with Dr. Valentina Lucks in August to determine whether or not patient should be on treatment for COPD. - No evidence of fluid overload on exam -- lungs CTAB, no LE edema. Patient does not complain of SOB.  Tendonitis, Achilles, left - Able to dorsi and plantar flex, limiting concern for complete tendon rupture. - Recommend patient rest and ice area. Stop if an activity brings pain. - Continue cam walker and crutches. - Patient requests to see her old PCP, who she knows is now a Sports Medicine fellow, for follow-up. Placed referral, as would benefit from reviewing rehab exercises and could be monitored for improvement with [name].   Muscle cramp - Chest pain resolved during visit and reproducible. Not associated with activity but is associated with position changes. - EKG unchanged from previous. Reviewed with preceptor Dr. Erin Hearing. Had recent cardiac catheterization, as well, that was negative for CAD.  - Will obtain BMP to look for electrolyte abnormalities that could be causing cramping (could have hypokalemia as on lasix) - Advised patient to take tylenol and home muscle relaxant prn - Return precautions given  Follow-up after PFTs.  Olene Floss, MD [place], PGY-3

## 2017-05-24 NOTE — Assessment & Plan Note (Signed)
-   Resolved. - Recommended PFT testing with Dr. Valentina Lucks in August to determine whether or not patient should be on treatment for COPD. - No evidence of fluid overload on exam -- lungs CTAB, no LE edema. Patient does not complain of SOB.

## 2017-05-31 ENCOUNTER — Telehealth: Payer: Self-pay | Admitting: Internal Medicine

## 2017-05-31 NOTE — Telephone Encounter (Signed)
Return call to patient regarding the tendonitis.  Advised to rest and ice the area. Patient can take Ibuprofen as needed for the pain until her appointment at Sports Medicine.  Appointment scheduled for 06/08/17 at 11:30 AM with Dr. Micheline Chapman.  Derl Barrow, RN

## 2017-05-31 NOTE — Telephone Encounter (Signed)
Pt has tendonitis, pt would like to know what she is suppose to take for it. Pt uses [facility]

## 2017-06-08 ENCOUNTER — Ambulatory Visit (INDEPENDENT_AMBULATORY_CARE_PROVIDER_SITE_OTHER): Payer: Medicare Other | Admitting: Sports Medicine

## 2017-06-08 DIAGNOSIS — M7662 Achilles tendinitis, left leg: Secondary | ICD-10-CM | POA: Diagnosis not present

## 2017-06-09 NOTE — Progress Notes (Signed)
   Subjective:    Patient ID: Donna Herrera, female    DOB: 05-16-1946, 71 y.o.   MRN: [id number]  HPI chief complaint: Left calf and heel pain  Very pleasant 71 year old female comes in today complaining of left calf and heel pain. Symptoms began acutely without any known trauma. She was seen at a local urgent care where they diagnosed her with Achilles tendinitis secondary to a fluoroquinolone which she was on for an infection. She was placed into a Cam Walker and her symptoms have improved. She wore the Cam Walker for about a week. She denies any significant pain currently. She does use crutches chronically secondary to slipped capital femoral epiphysis bilaterally. She denies numbness or tingling.  Past medical history reviewed Medications reviewed Allergies reviewed    Review of Systems As above    Objective:   Physical Exam  Well-developed, well-nourished. No acute distress.  Left calf: Patient is slightly tender to palpation along the distal calf. Negative Homans. No significant soft tissue swelling. No tenderness to palpation along the Achilles tendon. Positive plantar flexion with Thompson's testing. Neurovascularly intact distally. Ambulating with the assistance of crutches but this is her baseline.      Assessment & Plan:   Resolved left calf strain/Achilles tendinitis  I've given the patient a 5/16 inch heel lift to wear in her shoe if needed. Otherwise, I do not think any other workup or treatment is necessary at this point. She is doing well. She seems to have returned to her baseline. Follow-up with me as needed.

## 2017-06-12 ENCOUNTER — Telehealth: Payer: Self-pay | Admitting: *Deleted

## 2017-06-12 ENCOUNTER — Telehealth: Payer: Self-pay | Admitting: Internal Medicine

## 2017-06-12 ENCOUNTER — Ambulatory Visit: Payer: Medicare Other | Admitting: Rheumatology

## 2017-06-12 DIAGNOSIS — B370 Candidal stomatitis: Secondary | ICD-10-CM

## 2017-06-12 MED ORDER — HYDROCODONE-ACETAMINOPHEN 5-325 MG PO TABS: 1.0000 | ORAL_TABLET | Freq: Two times a day (BID) | ORAL | 0 refills | Status: DC | PRN

## 2017-06-12 MED ORDER — NYSTATIN 100000 UNIT/ML MT SUSP: 5.0000 mL | Freq: Four times a day (QID) | OROMUCOSAL | 0 refills | Status: DC

## 2017-06-12 NOTE — Telephone Encounter (Signed)
Spoke with patient over the phone. Symptoms do sound consistent with thrush--cannot brush off white plaque-- and did have recent exposure to oral steroids and breathing treatments. Advised her she needs to come in if symptoms do not improve in several days. Also refilled her norco and left rx up front but advised no further refills until follow-up.

## 2017-06-12 NOTE — Telephone Encounter (Signed)
Riley Nearing, RN, [facility] Tele Case Manager [phone]) left message on nurse line stating patient is reporting thrush-like symptoms following antibiotic mid-July. C/o painful white coating on tongue. Please contact patient at  [phone] to let her know if needs to be seen or if something can be called in L. Silvano Rusk, RN, BSN

## 2017-06-27 ENCOUNTER — Other Ambulatory Visit: Payer: Self-pay | Admitting: *Deleted

## 2017-06-28 MED ORDER — PANTOPRAZOLE SODIUM 20 MG PO TBEC
DELAYED_RELEASE_TABLET | ORAL | 3 refills | Status: DC
Start: 2017-06-28 — End: 2018-06-25

## 2017-06-28 MED ORDER — ATORVASTATIN CALCIUM 10 MG PO TABS: 10.0000 mg | ORAL_TABLET | Freq: Every day | ORAL | 1 refills | Status: DC

## 2017-07-07 ENCOUNTER — Telehealth: Payer: Self-pay | Admitting: *Deleted

## 2017-07-07 NOTE — Telephone Encounter (Signed)
Called patient. Advised her to take 2 lasix tablets (40 mg) tomorrow. If weight still elevated, repeat on Sunday. Call clinic Monday for appointment if no improvement.

## 2017-07-07 NOTE — Telephone Encounter (Signed)
[name] (nurse with [name]) left message on nurse line stating that patient is enrolled in their CHF program and today when she went to see her patient complained of increased SOB especially when laying down at night. Also weight has increased about 3lbs over the last week and a half. Patient states she hasnt missed any doses of her lasix or spironolactone. FYI to MD

## 2017-07-13 ENCOUNTER — Telehealth: Payer: Self-pay | Admitting: *Deleted

## 2017-07-13 ENCOUNTER — Other Ambulatory Visit: Payer: Self-pay | Admitting: Physician Assistant

## 2017-07-13 MED ORDER — HYDROCODONE-ACETAMINOPHEN 5-325 MG PO TABS: 1.0000 | ORAL_TABLET | Freq: Two times a day (BID) | ORAL | 0 refills | Status: DC | PRN

## 2017-07-13 NOTE — Telephone Encounter (Signed)
Please call office and schedule appointment for further refills. 

## 2017-07-13 NOTE — Addendum Note (Signed)
Addended by: Darci Needle on: 07/13/2017 02:58 PM   Modules accepted: Orders
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
## 2017-07-13 NOTE — Telephone Encounter (Signed)
Pt would like to know why the dr denied her refill on pain meds

## 2017-07-19 ENCOUNTER — Ambulatory Visit (INDEPENDENT_AMBULATORY_CARE_PROVIDER_SITE_OTHER): Payer: Medicare Other | Admitting: Internal Medicine

## 2017-07-19 ENCOUNTER — Encounter: Payer: Self-pay | Admitting: Internal Medicine

## 2017-07-19 VITALS — BP 146/88 | HR 75 | Temp 98.2°F | Wt 185.0 lb

## 2017-07-19 DIAGNOSIS — I10 Essential (primary) hypertension: Secondary | ICD-10-CM | POA: Diagnosis not present

## 2017-07-19 DIAGNOSIS — G894 Chronic pain syndrome: Secondary | ICD-10-CM

## 2017-07-19 MED ORDER — HYDROCODONE-ACETAMINOPHEN 5-325 MG PO TABS: 1.0000 | ORAL_TABLET | Freq: Two times a day (BID) | ORAL | 0 refills | Status: DC | PRN

## 2017-07-19 NOTE — Progress Notes (Signed)
[facility] Progress Note  Subjective:  Donna Herrera is a 71 y.o. female with history of CHF, possible COPD, DJD, HLD and chronic joint pain. She presents for chronic pain. 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 Had R hip replacement in 1987 and was told by [facility] several years ago that she needs a new hip replacement but is not interested in surgery.   She was seen by Rheumatology in July due to history of gout and complaint of multiple painful joints but denies gout flares and not interested in starting allopurinol (on colchicine); xrays performed at that visit showed severe OA of bilateral feet and severe spondylosis of C-spine and lumbar spine. Seeing an orthopedic doctor was recommended.   ROS: No falls, no fevers, no rashes  Social: Former smoker  Allergies  Allergen Reactions  . Crab [Shellfish Allergy]     Itching to lips  . Morphine And Related Itching and Swelling    Objective: Blood pressure (!) 146/88, pulse 75, temperature 98.2 F (36.8 C), temperature source Oral, weight 185 lb (83.9 kg). Body mass index is 29.86 kg/m. Constitutional: Overweight female in mild discomfort HENT: Very poor dentition with missing teeth and broken teeth.  Cardiovascular: RRR, S1, S2, no m/r/g.  Pulmonary/Chest: Effort normal and breath sounds normal.  Musculoskeletal: Pain with palpation over R greater trochanter without overlying skin changes. Pain with internal and external rotation of R hip but none of L hip. Mild crepitus of bilateral knees. Decreased mobility of R arm.  Vitals reviewed  Assessment/Plan: Chronic pain syndrome - Patient ambulates with crutches at baseline due to pain. Resistant to idea of repeat surgery to R hip and not interested in seeing Orthopedic Surgery at this time. Tried PT previously without improvement. Limited in treatment options due to heart failure--previously taking NSAIDs  regularly. Tylenol helps some. Discussed that she could take an occasional meloxicam tablet. Consider switching back to tramadol at next visit with scheduled tylenol. - Will obtain UDS - Provided norco Rx, #60 pills with 1 refll  Indication for chronic opioid: debilitating joint pain Medication and dose: hydrocodone-acetaminophen 5-325 mg  # pills per month: 60 Last UDS date: 03/2015 Pain contract signed (Y/N): April 2014 Date narcotic database last reviewed (include red flags): 07/19/2017; no red flags   Essential hypertension - BP elevated today at 146/88. May be in setting of dental pain. Follow-up after patient has seen dentist. Given age, would consider less strict goal but has history of hypertensive heart disease.   Dental pain: Provided list of dentists that provide free services and payment plans.   Follow-up in no less than 2 months to readdress pain and check BP.  Olene Floss, MD [place], PGY-3

## 2017-07-19 NOTE — Patient Instructions (Signed)
Donna Herrera,  I have refilled your pain medication for the next 2 months. Please see me back before you run out. You can take an occasional meloxicam tablet as needed.  Please contact the providers on the dental handout attached.  Best, Dr. Ola Spurr

## 2017-07-20 ENCOUNTER — Telehealth: Payer: Self-pay

## 2017-07-20 DIAGNOSIS — G894 Chronic pain syndrome: Secondary | ICD-10-CM

## 2017-07-20 DIAGNOSIS — I10 Essential (primary) hypertension: Secondary | ICD-10-CM

## 2017-07-20 HISTORY — DX: Essential (primary) hypertension: I10

## 2017-07-20 HISTORY — DX: Chronic pain syndrome: G89.4

## 2017-07-20 NOTE — Telephone Encounter (Signed)
Patient called in and was referred to the triage nurse because she said that she has been congested today and feels like she has the flu. She was here yesterday and was wondering if there was a bug going around.Donna Herrera

## 2017-07-20 NOTE — Assessment & Plan Note (Addendum)
-   Patient ambulates with crutches at baseline due to pain. Resistant to idea of repeat surgery to R hip and not interested in seeing Orthopedic Surgery at this time. Tried PT previously without improvement. Limited in treatment options due to heart failure--previously taking NSAIDs regularly. Tylenol helps some. Discussed that she could take an occasional meloxicam tablet. Consider switching back to tramadol at next visit with scheduled tylenol. - Will obtain UDS - Provided norco Rx, #60 pills with 1 refll  Indication for chronic opioid: debilitating joint pain Medication and dose: hydrocodone-acetaminophen 5-325 mg  # pills per month: 60 Last UDS date: 03/2015 Pain contract signed (Y/N): April 2014 Date narcotic database last reviewed (include red flags): 07/19/2017; no red flags

## 2017-07-21 ENCOUNTER — Ambulatory Visit (INDEPENDENT_AMBULATORY_CARE_PROVIDER_SITE_OTHER): Payer: Medicare Other | Admitting: Family Medicine

## 2017-07-21 ENCOUNTER — Encounter: Payer: Self-pay | Admitting: Family Medicine

## 2017-07-21 VITALS — BP 125/80 | HR 71 | Temp 98.9°F | Ht 66.0 in | Wt 185.2 lb

## 2017-07-21 DIAGNOSIS — R05 Cough: Secondary | ICD-10-CM

## 2017-07-21 DIAGNOSIS — J069 Acute upper respiratory infection, unspecified: Secondary | ICD-10-CM | POA: Insufficient documentation

## 2017-07-21 DIAGNOSIS — R059 Cough, unspecified: Secondary | ICD-10-CM

## 2017-07-21 MED ORDER — DM-GUAIFENESIN ER 30-600 MG PO TB12: 1.0000 | ORAL_TABLET | Freq: Two times a day (BID) | ORAL | 0 refills | Status: DC

## 2017-07-21 MED ORDER — AZITHROMYCIN 250 MG PO TABS: ORAL_TABLET | ORAL | 0 refills | Status: DC

## 2017-07-21 NOTE — Assessment & Plan Note (Signed)
-   BP elevated today at 146/88. May be in setting of dental pain. Follow-up after patient has seen dentist. Given age, would consider less strict goal but has history of hypertensive heart disease.

## 2017-07-21 NOTE — Progress Notes (Signed)
Subjective:    Patient ID: Donna Herrera , female   DOB: 1946-09-24 , 71 y.o..   MRN: [id number]  HPI  Donna Herrera is a 71 yo female with PMH of CHF, hypertension, chronic lung disease, hyperlipidemia, chronic painhere for  Chief Complaint  Patient presents with  . Cough    chest pain during cough x 3days  . Sore Throat    1. Cough and sore throat  Has been sick for 2 days. Nasal discharge: yes Medications tried: Robitussin Sick contacts: none She has had purulent sputum, with tinges of pink  Symptoms Fever: yes, 101F yesterday. Took aspirin and it resolved Headache or face pain: no Tooth pain: yes, notes that she is supposed to have a tooth pulled  Sneezing: none  Scratchy throat: yes  Allergies: no  Muscle aches: no Severe fatigue: no Stiff neck: no Shortness of breath: "a little" Rash: No  Sore throat or swollen glands: Yes, sore throat    ROS see HPI Smoking Status noted  Past Medical History: Patient Active Problem List   Diagnosis Date Noted  . Chronic pain syndrome 07/20/2017  . Essential hypertension 07/20/2017  . Tendonitis, Achilles, left 05/24/2017  . Muscle cramp 05/24/2017  . Joint inflammation 03/06/2017  . Depression 08/25/2016  . NICM (nonischemic cardiomyopathy) ([place]) 06/09/2016  . Chronic combined systolic and diastolic CHF (congestive heart failure) ([place]) 10/21/2015  . Chronic lung disease   . Flank pain, right 10/03/2015  . Cough 10/03/2015  . Numbness of fingers of both hands 03/20/2015  . Shortness of breath 01/16/2015  . Dyshidrotic eczema 12/31/2014  . Vitamin D deficiency 12/31/2014  . Tobacco abuse 09/29/2014  . Mass of breast, left 07/21/2014  . Hyperlipidemia 09/16/2013  . Chronic left shoulder pain 04/10/2013  . Right hip pain 11/02/2012  . Hypertensive heart disease with CHF (congestive heart failure) ([place]) 10/12/2012  . DJD (degenerative joint disease) 10/12/2012  . Gout 10/12/2012  . GERD  (gastroesophageal reflux disease) 10/12/2012    Medications: reviewed and updated Current Outpatient Prescriptions  Medication Sig Dispense Refill  . albuterol (PROVENTIL HFA;VENTOLIN HFA) 108 (90 Base) MCG/ACT inhaler Inhale 2 puffs into the lungs every 6 (six) hours as needed for wheezing or shortness of breath. (Patient not taking: Reported on 06/08/2017) 1 Inhaler 0  . aspirin 81 MG tablet Take 1 tablet (81 mg total) by mouth daily. 90 tablet 3  . atorvastatin (LIPITOR) 10 MG tablet Take 1 tablet (10 mg total) by mouth daily. 90 tablet 1  . azithromycin (ZITHROMAX) 250 MG tablet 500 mg orally on day 1 followed by 250 mg/day orally on days 2 to 5 6 tablet 0  . carvedilol (COREG) 6.25 MG tablet Take 1 tablet (6.25 mg total) by mouth 2 (two) times daily. 60 tablet 11  . cetirizine (ZYRTEC) 10 MG tablet TAKE 1 TABLET (10 MG TOTAL) BY MOUTH DAILY. 30 tablet 5  . colchicine 0.6 MG tablet Take 1 tablet (0.6 mg total) by mouth daily as needed. Take 1 tablet daily as needed for Gouty flare. 30 tablet 0  . cyclobenzaprine (FLEXERIL) 10 MG tablet Take 1 tablet (10 mg total) by mouth 2 (two) times daily as needed for muscle spasms. 30 tablet 2  . dextromethorphan-guaiFENesin (MUCINEX DM) 30-600 MG 12hr tablet Take 1 tablet by mouth 2 (two) times daily. 20 tablet 0  . FLUoxetine (PROZAC) 20 MG tablet Take 1 tablet (20 mg total) by mouth daily. 30 tablet 3  . furosemide (LASIX) 20 MG tablet  TAKE 1 TABLET (20 MG TOTAL) BY MOUTH DAILY. 90 tablet 3  . HYDROcodone-acetaminophen (NORCO/VICODIN) 5-325 MG tablet Take 1 tablet by mouth 2 (two) times daily as needed for moderate pain. 60 tablet 0  . losartan (COZAAR) 100 MG tablet Take 1 tablet (100 mg total) by mouth daily. 90 tablet 3  . pantoprazole (PROTONIX) 20 MG tablet TAKE 2 TABLETS (40 MG TOTAL) BY MOUTH DAILY. 180 tablet 3  . predniSONE (DELTASONE) 50 MG tablet Take 1 tablet (50 mg total) by mouth daily with breakfast. (Patient not taking: Reported on  06/08/2017) 5 tablet 0  . spironolactone (ALDACTONE) 25 MG tablet Take 0.5 tablets (12.5 mg total) by mouth daily. (Patient not taking: Reported on 06/08/2017) 90 tablet 3  . spironolactone (ALDACTONE) 25 MG tablet TAKE 1 TABLET BY MOUTH DAILY. 30 tablet 0  . triamcinolone ointment (KENALOG) 0.5 % Apply 1 application topically 2 (two) times daily. To palms 30 g 3   No current facility-administered medications for this visit.     Social Hx:  reports that she has quit smoking. She smoked 0.50 packs per day. She uses smokeless tobacco.   Objective:   BP 125/80 (BP Location: Right Arm, Patient Position: Sitting, Cuff Size: Normal)   Pulse 71   Temp 98.9 F (37.2 C) (Oral)   Ht 5\' 6"  (1.676 m)   Wt 185 lb 3.2 oz (84 kg)   BMI 29.89 kg/m  Physical Exam  Gen: NAD, alert, cooperative with exam, well-appearing HEENT:     Head: Normocephalic, atraumatic    Neck: No masses palpated. No goiter. No lymphadenopathy     Ears: External ears normal, no drainage.Tympanic membranes intact, normal light reflex bilaterally, no erythema or bulging    Eyes: PERRLA, EOMI, sclera white, normal conjunctiva    Nose: nasal turbinates moist, no nasal discharge    Throat: moist mucus membranes, no pharyngeal erythema, no tonsillar exudate. Airway is patent Cardiac: Regular rate and rhythm, normal S1/S2, no edema Respiratory: Clear to auscultation bilaterally, no wheezes, non-labored breathing  Assessment & Plan:  Cough Symptoms and history most consistent with viral respiratory infection. Reassuring pulmonary exam as patient has normal work of breathing, no wheezes or crackles. Oxygen 97% on room air and patient afebrile. Less likely pneumonia as symptoms have only been going on for 48 hours, however she does have risk factors with history of possible COPD. Per Epic review patient was supposed to have PFT testing but cannot see that this was done in Epic. No signs of COPD exacerbation today. -Conservative  measures; rest, plenty of fluids, warm tea with honey, Tylenol when necessary for fever and sore throat -Gave patient prescription for a Z-Pak if she doesn't start to feel better after the weekend she can take this -She will get a chest x-ray on Monday if symptoms don't start to improve -Mucinex -Return precautions and red flag symptoms discussed   Orders Placed This Encounter  Procedures  . DG Chest 2 View    Standing Status:   Future    Standing Expiration Date:   09/20/2018    Order Specific Question:   Reason for Exam (SYMPTOM  OR DIAGNOSIS REQUIRED)    Answer:   cough    Order Specific Question:   Preferred imaging location?    Answer:   [facility]   Meds ordered this encounter  Medications  . dextromethorphan-guaiFENesin (MUCINEX DM) 30-600 MG 12hr tablet    Sig: Take 1 tablet by mouth 2 (two) times daily.  Dispense:  20 tablet    Refill:  0  . azithromycin (ZITHROMAX) 250 MG tablet    Sig: 500 mg orally on day 1 followed by 250 mg/day orally on days 2 to 5    Dispense:  6 tablet    Refill:  0    Smitty Cords, MD [place], PGY-3

## 2017-07-21 NOTE — Assessment & Plan Note (Deleted)
Symptoms and history most consistent with viral URI. Reassuring pulmonary exam as patient has normal work of breathing, no wheezes or crackles. Oxygen 97% on room air and patient afebrile. Less likely pneumonia as symptoms have only been going on for 48 hours, however she does have risk factors with history of possible COPD. Per Epic review patient was supposed to have PFT testing but cannot see that this was done in Epic. No signs of COPD exacerbation today. -Conservative measures; rest, plenty of fluids, warm tea with honey, Tylenol when necessary -Gave patient prescription for a Z-Pak if she doesn't start to feel better after the weekend she can take this -She will get a chest x-ray on Monday if symptoms don't start to improve -Mucinex -Return precautions and red flag symptoms discussed

## 2017-07-21 NOTE — Patient Instructions (Signed)
Thank you for coming in today, it was so nice to see you! Today we talked about:    Take Z-pack and get chest xray only if you are not feeling better after the weekend  Take Tylenol as needed for fever and pain  I have sent in a cough medication to your pharmacy   Please follow up next week if you are not feeling better.   If you have any questions or concerns, please do not hesitate to call the office at [phone]. You can also message me directly via MyChart.   Sincerely,  Smitty Cords, MD    Upper Respiratory Infection, Adult Most upper respiratory infections (URIs) are caused by a virus. A URI affects the nose, throat, and upper air passages. The most common type of URI is often called "the common cold." Follow these instructions at home:  Take medicines only as told by your doctor.  Gargle warm saltwater or take cough drops to comfort your throat as told by your doctor.  Use a warm mist humidifier or inhale steam from a shower to increase air moisture. This may make it easier to breathe.  Drink enough fluid to keep your pee (urine) clear or pale yellow.  Eat soups and other clear broths.  Have a healthy diet.  Rest as needed.  Go back to work when your fever is gone or your doctor says it is okay. ? You may need to stay home longer to avoid giving your URI to others. ? You can also wear a face mask and wash your hands often to prevent spread of the virus.  Use your inhaler more if you have asthma.  Do not use any tobacco products, including cigarettes, chewing tobacco, or electronic cigarettes. If you need help quitting, ask your doctor. Contact a doctor if:  You are getting worse, not better.  Your symptoms are not helped by medicine.  You have chills.  You are getting more short of breath.  You have brown or red mucus.  You have yellow or brown discharge from your nose.  You have pain in your face, especially when you bend forward.  You  have a fever.  You have puffy (swollen) neck glands.  You have pain while swallowing.  You have white areas in the back of your throat. Get help right away if:  You have very bad or constant: ? Headache. ? Ear pain. ? Pain in your forehead, behind your eyes, and over your cheekbones (sinus pain). ? Chest pain.  You have long-lasting (chronic) lung disease and any of the following: ? Wheezing. ? Long-lasting cough. ? Coughing up blood. ? A change in your usual mucus.  You have a stiff neck.  You have changes in your: ? Vision. ? Hearing. ? Thinking. ? Mood. This information is not intended to replace advice given to you by your health care provider. Make sure you discuss any questions you have with your health care provider. Document Released: 04/04/2008 Document Revised: 06/19/2016 Document Reviewed: 01/22/2014 Elsevier Interactive Patient Education  2018 [employer].

## 2017-07-21 NOTE — Assessment & Plan Note (Signed)
Symptoms and history most consistent with viral respiratory infection. Reassuring pulmonary exam as patient has normal work of breathing, no wheezes or crackles. Oxygen 97% on room air and patient afebrile. Less likely pneumonia as symptoms have only been going on for 48 hours, however she does have risk factors with history of possible COPD. Per Epic review patient was supposed to have PFT testing but cannot see that this was done in Epic. No signs of COPD exacerbation today. -Conservative measures; rest, plenty of fluids, warm tea with honey, Tylenol when necessary for fever and sore throat -Gave patient prescription for a Z-Pak if she doesn't start to feel better after the weekend she can take this -She will get a chest x-ray on Monday if symptoms don't start to improve -Mucinex -Return precautions and red flag symptoms discussed

## 2017-07-24 LAB — TOXASSURE SELECT 13 (MW), URINE

## 2017-08-02 ENCOUNTER — Telehealth: Payer: Self-pay | Admitting: *Deleted

## 2017-08-02 NOTE — Telephone Encounter (Signed)
[name], RN [facility] with [facility] left message on nurse line regarding patient's Lasix dosage. Reported that patient is taking Lasix 30 mg daily instead of 40 mg. Patient is having a lot of cramping with the 40 mg dosage. Pt also has swelling of ankles and feet. Pt has feet/ankles elevated. Pt's wt on 07/31/17 187.2 and today 185.8. Please give patient a call [phone] or [name] at [phone] ext [phone]. Derl Barrow, RN

## 2017-08-02 NOTE — Telephone Encounter (Signed)
Called [place] back. Weight is stable from my last OV with patient. Relayed that 30 mg lasix is fine as long as patient not gaining weight. Recommended wearing compression hose during the day and elevating her legs if tolerated.

## 2017-08-23 ENCOUNTER — Other Ambulatory Visit: Payer: Self-pay | Admitting: Physician Assistant

## 2017-08-23 DIAGNOSIS — I5042 Chronic combined systolic (congestive) and diastolic (congestive) heart failure: Secondary | ICD-10-CM

## 2017-08-23 DIAGNOSIS — I428 Other cardiomyopathies: Secondary | ICD-10-CM

## 2017-09-05 ENCOUNTER — Ambulatory Visit: Payer: Medicare Other | Admitting: Internal Medicine

## 2017-09-11 ENCOUNTER — Ambulatory Visit (INDEPENDENT_AMBULATORY_CARE_PROVIDER_SITE_OTHER): Payer: Medicare Other | Admitting: Internal Medicine

## 2017-09-11 ENCOUNTER — Encounter: Payer: Self-pay | Admitting: Internal Medicine

## 2017-09-11 DIAGNOSIS — Z23 Encounter for immunization: Secondary | ICD-10-CM

## 2017-09-11 DIAGNOSIS — L0292 Furuncle, unspecified: Secondary | ICD-10-CM | POA: Diagnosis not present

## 2017-09-11 NOTE — Patient Instructions (Addendum)
It was nice meeting you today Donna Herrera!  Please follow the instructions below to care for your boil (skin abscess). The most important thing is to put warm compresses on the area several times a day to help it drain.   If the area becomes very red or more painful, or if you develop fevers, chills, nausea, or vomiting, please call our office or go to the emergency room, as these can be signs of infection.   If you have any questions or concerns, please feel free to call the clinic.   Be well,  Dr. Avon Gully   Skin Abscess A skin abscess is an infected area on or under your skin that contains pus and other material. An abscess can happen almost anywhere on your body. Some abscesses break open (rupture) on their own. Most continue to get worse unless they are treated. The infection can spread deeper into the body and into your blood, which can make you feel sick. Treatment usually involves draining the abscess. Follow these instructions at home: Abscess Care  If you have an abscess that has not drained, place a warm, clean, wet washcloth over the abscess several times a day. Do this as told by your doctor.  Follow instructions from your doctor about how to take care of your abscess. Make sure you: ? Wash your hands with soap and water before you change the bandage or gauze. If you cannot use soap and water, use hand sanitizer.  Check your abscess every day for signs that the infection is getting worse. Check for: ? More redness, swelling, or pain. ? More fluid or blood. ? Warmth. ? More pus or a bad smell.  General instructions  To avoid spreading the infection: ? Do not share personal care items, towels, or hot tubs with others. ? Avoid making skin-to-skin contact with other people.  Keep all follow-up visits as told by your doctor. This is important. Contact a doctor if:  You have more redness, swelling, or pain around your abscess.  You have more fluid or blood  coming from your abscess.  Your abscess feels warm when you touch it.  You have more pus or a bad smell coming from your abscess.  You have a fever.  Your muscles ache.  You have chills.  You feel sick. Get help right away if:  You have very bad (severe) pain.  You see red streaks on your skin spreading away from the abscess. This information is not intended to replace advice given to you by your health care provider. Make sure you discuss any questions you have with your health care provider. Document Released: 04/04/2008 Document Revised: 06/12/2016 Document Reviewed: 08/26/2015 Elsevier Interactive Patient Education  [employer].

## 2017-09-11 NOTE — Progress Notes (Signed)
   Subjective:   Patient: Donna Herrera       Birthdate: 05-Aug-1946       MRN: [id number]      HPI  Donna Herrera is a 71 y.o. female presenting for boil.   Boil First noticed 1.5 weeks ago. Has been getting larger. Has not drained. Located in vaginal region. Has had boils in past but not recently. Was initially rather painful, though pain is improving now. Is now hard. Has been washing with warm water, but has not put warm compresses on the area. Has not tried anything to improve symptoms. Says initially area was red, but now is more white. Denies fevers, chills, abdominal pain, nausea, vomiting.   Smoking status reviewed. Patient is former smoker.   Review of Systems See HPI.     Objective:  Physical Exam  Constitutional: She is oriented to person, place, and time and well-developed, well-nourished, and in no distress.  HENT:  Head: Normocephalic and atraumatic.  Pulmonary/Chest: Effort normal. No respiratory distress.  Genitourinary:  Genitourinary Comments: Mildly erythematous indurated lesion consistent with skin abscess located on L vulva. Non-tender to palpation. No purulent material draining or able to be expressed. No streaking.   Neurological: She is alert and oriented to person, place, and time.  Skin: Skin is warm and dry.  Psychiatric: Affect and judgment normal.      Assessment & Plan:  Boil Located on L vulva. Not yet draining. No signs of infection, either local or systemic. As such, will treat with warm compresses to help drain, but do not feel that antibiotics are indicated at this time. Discussed using compresses several times a day to help with drainage. Discussed and provided handout detailing warning signs. If worsening, would recommend beginning PO abx.    Adin Hector, MD, MPH PGY-3 [facility] Pager [phone]

## 2017-09-11 NOTE — Assessment & Plan Note (Signed)
Located on L vulva. Not yet draining. No signs of infection, either local or systemic. As such, will treat with warm compresses to help drain, but do not feel that antibiotics are indicated at this time. Discussed using compresses several times a day to help with drainage. Discussed and provided handout detailing warning signs. If worsening, would recommend beginning PO abx.

## 2017-09-27 ENCOUNTER — Other Ambulatory Visit: Payer: Self-pay | Admitting: Physician Assistant

## 2017-09-27 DIAGNOSIS — I428 Other cardiomyopathies: Secondary | ICD-10-CM

## 2017-09-27 DIAGNOSIS — I5042 Chronic combined systolic (congestive) and diastolic (congestive) heart failure: Secondary | ICD-10-CM

## 2017-10-04 ENCOUNTER — Telehealth: Payer: Self-pay | Admitting: Internal Medicine

## 2017-10-04 NOTE — Telephone Encounter (Signed)
Patient would need an appointment to be evaluated. Please let her know.

## 2017-10-04 NOTE — Telephone Encounter (Signed)
Pt called and said shes been taking the medication prescribed for her thrush in her mouth for over a month and it isnt helping. Pt would like new and stronger medication. She says its very uncomfortable and she cant taste anything and it burns bad. Please advise

## 2017-10-04 NOTE — Telephone Encounter (Signed)
scheduled pt an appt. Donna Herrera Kennon Holter, CMA

## 2017-10-13 ENCOUNTER — Ambulatory Visit: Payer: Medicare Other | Admitting: Internal Medicine

## 2017-10-18 ENCOUNTER — Encounter: Payer: Self-pay | Admitting: Internal Medicine

## 2017-10-18 ENCOUNTER — Ambulatory Visit (INDEPENDENT_AMBULATORY_CARE_PROVIDER_SITE_OTHER): Payer: Medicare Other | Admitting: Internal Medicine

## 2017-10-18 ENCOUNTER — Other Ambulatory Visit: Payer: Self-pay

## 2017-10-18 VITALS — BP 142/80 | HR 71 | Temp 98.7°F | Wt 184.0 lb

## 2017-10-18 DIAGNOSIS — G894 Chronic pain syndrome: Secondary | ICD-10-CM

## 2017-10-18 DIAGNOSIS — Z1159 Encounter for screening for other viral diseases: Secondary | ICD-10-CM

## 2017-10-18 DIAGNOSIS — B370 Candidal stomatitis: Secondary | ICD-10-CM | POA: Diagnosis not present

## 2017-10-18 MED ORDER — HYDROCODONE-ACETAMINOPHEN 5-325 MG PO TABS: 1.0000 | ORAL_TABLET | Freq: Two times a day (BID) | ORAL | 0 refills | Status: DC | PRN

## 2017-10-18 MED ORDER — MELOXICAM 15 MG PO TABS: 15.0000 mg | ORAL_TABLET | Freq: Every day | ORAL | 0 refills | Status: DC | PRN

## 2017-10-18 MED ORDER — FLUCONAZOLE 200 MG PO TABS: 200.0000 mg | ORAL_TABLET | Freq: Every day | ORAL | 0 refills | Status: DC

## 2017-10-18 NOTE — Patient Instructions (Signed)
Ms. Mckiddy,  Please take diflucan 200 mg daily for the next 2 weeks. If your mouth symptoms continue after that, please call me.  I have refilled your pain medication. Please make an office visit before you need your next refill.  Best, Dr. Ola Spurr

## 2017-10-18 NOTE — Progress Notes (Signed)
[facility] Progress Note  Subjective:  Donna Herrera is a 71 y.o. female with history of CHF, possible COPD, DJD, HLD and chronic joint pain who presents for continued mouth pain and medication refills.   #Mouth pain: - Patient says food tastes weird and that she has noted sores on the roof of her mouth - She was concerned about a white coating on her tongue over the summer, for which nystatin swish and swallow was prescribed. She had some improvement but continues to have white plaque and strange taste in her mouth - Denies trouble swallowing or problems with drooling  #Chronic pain: - Ambulates at baseline with crutches due to pain and not wanting repeat surgery of R hip and not interested in PT, as did not have improvement in symptoms previously  - Ran out of norco about 1 week ago with noted worsening of pain; cold weather also tends to make pain worse - Usually takes 1 norco a day but occasionally takes 2; (last rx was for #60 and filled towards end of October)  - Also complains of worsening L ankle pain x 3 days. Wondered if she could be having a gout flare but denies redness or trouble bearing weight. Requests handicap placard and mobic refill (takes only prn because of heart disease) ROS: no fever  Allergies  Allergen Reactions  . Crab [Shellfish Allergy]     Itching to lips  . Morphine And Related Itching and Swelling    Social History   Tobacco Use  . Smoking status: Former Smoker    Packs/day: 0.50  . Smokeless tobacco: Current User  . Tobacco comment: decreased smoking/1 pack every 3 days  Substance Use Topics  . Alcohol use: Yes    Comment: on holidays    Objective: Blood pressure (!) 142/80, pulse 71, temperature 98.7 F (37.1 C), temperature source Oral, weight 184 lb (83.5 kg), SpO2 99 %. Body mass index is 29.7 kg/m. Constitutional: Chronically ill appearing female in NAD HENT: Small white patch on tongue that cannot be completely  scraped off. Two < 1 cm ulcers on roof of mouth.  Cardiovascular: RRR, S1, S2, no m/r/g.  Pulmonary/Chest: Effort normal and breath sounds normal.  Abdominal: Soft. +BS, NT Musculoskeletal: L ankle more swollen than right with some discomfort with dorsi and plantar flexion compared with R.  Skin: No erythema or increased warmth over L ankle.  Psychiatric: Normal mood and affect.  Vitals reviewed  Assessment/Plan: Candidiasis, mouth - Ongoing since this summer with mild improvement with nystatin swish and swallow treatment but continues to affect taste. Could also be aphthous ulcers. History of tobacco abuse but no oral lesions concerning for mass. - Will escalate therapy to diflucan 200 mg daily x 14 days.  - Will obtain HIV screen  - Would next try magic mouthwash if no improvement in symptoms after treatment with nystatin  Chronic pain syndrome - Stable but with increased pain since running out of norco last week.  - Can take tylenol regularly for arthritis pain and occasional mobic tablet on severe days or with joint swelling - Provided norco Rx with reduced number of pills to #45 pills, with 1 refill, as patient not taking 2 pills daily - Consider switching to tramadol at next appointment because patient not considering surgical management - Filled out 5-year handicap placard, as patient needs crutches to get around due to pain and does not plan to have R hip repair  Indication for chronic opioid: debilitating joint pain  Medication and dose: hydrocodone-acetaminophen 5-325 mg  # pills per month: 45 Last UDS date: 07/2017 Pain contract signed (Y/N): April 2014 Date narcotic database last reviewed (include red flags):10/18/2017; no red flags  Follow-up in a couple months for chronic pain before norco runs out. Will need low dose CT lung screen ordered at that time.   Olene Floss, MD [place], PGY-3

## 2017-10-19 ENCOUNTER — Encounter: Payer: Self-pay | Admitting: Internal Medicine

## 2017-10-19 DIAGNOSIS — B370 Candidal stomatitis: Secondary | ICD-10-CM

## 2017-10-19 HISTORY — DX: Candidal stomatitis: B37.0

## 2017-10-19 LAB — HEPATITIS C ANTIBODY: Hep C Virus Ab: <0.1 s/co ratio (ref 0.0–0.9)

## 2017-10-19 LAB — HIV ANTIBODY (ROUTINE TESTING W REFLEX): HIV SCREEN 4TH GENERATION: NONREACTIVE

## 2017-10-19 NOTE — Assessment & Plan Note (Addendum)
-   Ongoing since this summer with mild improvement with nystatin swish and swallow treatment but continues to affect taste. Could also be aphthous ulcers. History of tobacco abuse but no oral lesions concerning for mass. - Will escalate therapy to diflucan 200 mg daily x 14 days.  - Will obtain HIV screen  - Would next try magic mouthwash if no improvement in symptoms after treatment with nystatin

## 2017-10-19 NOTE — Assessment & Plan Note (Addendum)
-   Stable but with increased pain since running out of norco last week.  - Can take tylenol regularly for arthritis pain and occasional mobic tablet on severe days or with joint swelling - Provided norco Rx with reduced number of pills to #45 pills, with 1 refill, as patient not taking 2 pills daily - Consider switching to tramadol at next appointment because patient not considering surgical management - Filled out 5-year handicap placard, as patient needs crutches to get around due to pain and does not plan to have R hip repair  Indication for chronic opioid: debilitating joint pain Medication and dose: hydrocodone-acetaminophen 5-325 mg  # pills per month: 45 Last UDS date: 07/2017 Pain contract signed (Y/N): April 2014 Date narcotic database last reviewed (include red flags):10/18/2017; no red flags

## 2017-10-20 ENCOUNTER — Encounter: Payer: Self-pay | Admitting: Internal Medicine

## 2017-10-23 ENCOUNTER — Other Ambulatory Visit: Payer: Self-pay

## 2017-10-23 MED ORDER — SPIRONOLACTONE 25 MG PO TABS: 25.0000 mg | ORAL_TABLET | Freq: Every day | ORAL | 0 refills | Status: DC

## 2017-10-25 ENCOUNTER — Other Ambulatory Visit: Payer: Self-pay

## 2017-10-25 MED ORDER — SPIRONOLACTONE 25 MG PO TABS: 25.0000 mg | ORAL_TABLET | Freq: Every day | ORAL | 0 refills | Status: DC

## 2017-10-26 ENCOUNTER — Other Ambulatory Visit: Payer: Self-pay | Admitting: Physician Assistant

## 2017-11-12 ENCOUNTER — Other Ambulatory Visit: Payer: Self-pay | Admitting: Physician Assistant

## 2017-11-12 DIAGNOSIS — I5042 Chronic combined systolic (congestive) and diastolic (congestive) heart failure: Secondary | ICD-10-CM

## 2017-11-12 DIAGNOSIS — I428 Other cardiomyopathies: Secondary | ICD-10-CM

## 2017-11-14 ENCOUNTER — Other Ambulatory Visit: Payer: Self-pay | Admitting: Internal Medicine

## 2017-11-20 ENCOUNTER — Other Ambulatory Visit: Payer: Self-pay

## 2017-11-20 DIAGNOSIS — I428 Other cardiomyopathies: Secondary | ICD-10-CM

## 2017-11-20 DIAGNOSIS — I5042 Chronic combined systolic (congestive) and diastolic (congestive) heart failure: Secondary | ICD-10-CM

## 2017-11-20 NOTE — Telephone Encounter (Signed)
Called and spoke with patient about needing to schedule follow up office visit to receive refill of her Carvedilol 6.25 mg and she stated she has not been feeling bad and does not want to schedule follow up office visit. Patient stated she will get her medication refills from her PCP and thanked me for my call.

## 2017-11-23 ENCOUNTER — Telehealth: Payer: Self-pay | Admitting: *Deleted

## 2017-11-23 NOTE — Telephone Encounter (Signed)
Patient left message on nurse line requesting return call to discuss Losartan. Returned call. Patient heard about losartan recall and is concerned. Instructed pt to call [facility] so they can let her know if her lot is part of recall. Pt states she will. Hubbard Hartshorn, RN, BSN

## 2017-12-14 ENCOUNTER — Other Ambulatory Visit: Payer: Self-pay

## 2017-12-14 MED ORDER — FLUOXETINE HCL 20 MG PO TABS: 20.0000 mg | ORAL_TABLET | Freq: Every day | ORAL | 3 refills | Status: DC

## 2017-12-28 ENCOUNTER — Other Ambulatory Visit: Payer: Self-pay | Admitting: Physician Assistant

## 2017-12-28 ENCOUNTER — Telehealth: Payer: Self-pay

## 2017-12-28 DIAGNOSIS — I5042 Chronic combined systolic (congestive) and diastolic (congestive) heart failure: Secondary | ICD-10-CM

## 2017-12-28 DIAGNOSIS — I428 Other cardiomyopathies: Secondary | ICD-10-CM

## 2017-12-28 NOTE — Telephone Encounter (Signed)
Spoke with patient, needs refills on carvedilol and spironolactone. Theses are prescribed by cardiology, but she is requesting to have them filled by PCP. Cardiology is requiring an office visit and is too expensive per pt. Wallace Cullens, RN

## 2017-12-29 MED ORDER — CARVEDILOL 6.25 MG PO TABS: 6.2500 mg | ORAL_TABLET | Freq: Two times a day (BID) | ORAL | 2 refills | Status: DC

## 2017-12-29 MED ORDER — SPIRONOLACTONE 25 MG PO TABS: 25.0000 mg | ORAL_TABLET | Freq: Every day | ORAL | 2 refills | Status: DC

## 2017-12-29 NOTE — Telephone Encounter (Signed)
Discussed need for refills with patient. She says copay is too expensive for her at this time to see Cardiology. Says cost is $30-50. She also says it has been hard for her to get transportation. Prescribed requested refills x 3 months to allow time to coordinate with with Cardiology. Asked her to make appointment with [name] to see how we can help her get to Cardiology. She has significant heart failure and would benefit from regular appointments with specialist. Had normal potassium and SCr 7 months ago.  Olene Floss, MD [place], PGY-3

## 2017-12-31 ENCOUNTER — Other Ambulatory Visit: Payer: Self-pay | Admitting: Internal Medicine

## 2018-01-02 ENCOUNTER — Telehealth: Payer: Self-pay

## 2018-01-02 ENCOUNTER — Telehealth: Payer: Self-pay | Admitting: Internal Medicine

## 2018-01-02 MED ORDER — HYDROCODONE-ACETAMINOPHEN 5-325 MG PO TABS: 1.0000 | ORAL_TABLET | Freq: Two times a day (BID) | ORAL | 0 refills | Status: DC | PRN

## 2018-01-02 NOTE — Telephone Encounter (Signed)
Pt is calling and would like a refill on her pain medication to be left up front for pick up. jw

## 2018-01-02 NOTE — Telephone Encounter (Signed)
Sent prescription directly to her pharmacy via [place]. Please let her know she should be able to pick this up without a trip to our clinic. Thank you.  Olene Floss, MD [place], PGY-3

## 2018-01-02 NOTE — Telephone Encounter (Signed)
Called patient and informed her that her RX for Hydrocodone-acetaminophen has been sent to pharmacy.Ozella Almond, CMA

## 2018-01-04 ENCOUNTER — Encounter: Payer: Self-pay | Admitting: Internal Medicine

## 2018-01-04 ENCOUNTER — Other Ambulatory Visit: Payer: Self-pay

## 2018-01-04 ENCOUNTER — Ambulatory Visit (INDEPENDENT_AMBULATORY_CARE_PROVIDER_SITE_OTHER): Payer: Medicare Other | Admitting: Internal Medicine

## 2018-01-04 VITALS — BP 110/68 | HR 98 | Temp 98.2°F | Ht 66.0 in | Wt 185.0 lb

## 2018-01-04 DIAGNOSIS — M25551 Pain in right hip: Secondary | ICD-10-CM

## 2018-01-04 DIAGNOSIS — M549 Dorsalgia, unspecified: Secondary | ICD-10-CM

## 2018-01-04 DIAGNOSIS — Z1211 Encounter for screening for malignant neoplasm of colon: Secondary | ICD-10-CM | POA: Diagnosis not present

## 2018-01-04 MED ORDER — HYDROCODONE-ACETAMINOPHEN 5-325 MG PO TABS: 1.0000 | ORAL_TABLET | Freq: Two times a day (BID) | ORAL | 0 refills | Status: DC | PRN

## 2018-01-04 MED ORDER — CYCLOBENZAPRINE HCL 10 MG PO TABS: 10.0000 mg | ORAL_TABLET | Freq: Two times a day (BID) | ORAL | 1 refills | Status: DC | PRN

## 2018-01-04 NOTE — Patient Instructions (Addendum)
Ms. Mumme,  I think muscle spasm is causing headache and back pains. Please try half to 1 full pill of flexeril as needed for spasm. Heat will likely help too.  Please see me back this spring/summer to discuss pain. If no improvement, please see me back in about 2 weeks.  Best, Dr. Ola Spurr

## 2018-01-07 ENCOUNTER — Encounter: Payer: Self-pay | Admitting: Internal Medicine

## 2018-01-07 DIAGNOSIS — M549 Dorsalgia, unspecified: Secondary | ICD-10-CM | POA: Insufficient documentation

## 2018-01-07 NOTE — Progress Notes (Signed)
[facility] Progress Note  Subjective:  Donna Herrera is a 72 y.o. with history of CHF, possible COPD, DJD, HLD and chronic joint pain who presents for continued back and right hip pain. She is accompanied by her daughter. Over the last couple of weeks, patient has had upper back pain and associated headaches. Pains come and go. Her right hip pain has also been worse than usual--she attributes this to cold weather. She takes hydrocodone-acetaminophen 5-325 mg usually once a day and may take an extra half a pill on a bad day. She rarely takes 2 pills a day and has not run out of medication with decrease of #45 pills a month from #60. This medication does "take the edge off" and allows her to be more active. She does not want to take more medication because she does not want to get "addicted." When asked about tramadol, she says this did not help in past. She takes meloxicam every once in a while for a very bad day, as tylenol does not help. She also takes aspirin daily and colchicine. She took flexeril previously for muscle spasms, which helped, and did not cause lasting sedation when taken at bedtime. ROS: She denies any numbness or tingling into her hands. No vision changes. No recent falls.  Allergies  Allergen Reactions  . Crab [Shellfish Allergy]     Itching to lips  . Morphine And Related Itching and Swelling   Social: Quit smoking last April  Objective: Blood pressure 110/68, pulse 98, temperature 98.2 F (36.8 C), temperature source Oral, height 5\' 6"  (1.676 m), weight 185 lb (83.9 kg), SpO2 97 %. Body mass index is 29.86 kg/m. Constitutional: Older female, in NAD Cardiovascular: RRR, S1, S2, no m/r/g.  Pulmonary/Chest: Effort normal and breath sounds normal.  Musculoskeletal: TTP over R greater trochanter. Uses crutches to get around. No midline spinal TTP but increased muscle tension over bilateral neck and over upper back.  Neurological: AOx3, no focal  deficits. Normal grip strength bilaterally, normal ulnar and radial strength.  Skin: Skin is warm and dry. No rash noted.  Psychiatric: Normal mood and affect.  Vitals reviewed  Xray cervical spine 04/2017: Multilevel spondylosis with severe narrowing between C3-4, C4-5, C5-6, C6-7 and anterior spurring. Xray hips 10/2012: Stable. Right hip arthroplasty with previous acetabular revision noted with superior positioning of the femoral head component in relation to the acetabular component as previously noted. The multiple previously fractured wires around the proximal femur do not appear significantly changed.  Assessment/Plan: Right hip pain - Chronic, stable on current pain regimen. Discussed that surgery was definitive treatment but patient not interested because afraid of bad result and has already had multiple surgeries. Suggested trying PT again, but patient declines at this time.  Indication for chronic opioid:debilitating joint pain Medication and dose:hydrocodone-acetaminophen 5-325 mg # pills per month:45 Last UDS date:07/2017 Pain contract signed (Y/N):April 2014 Date narcotic database last reviewed (include red flags): 01/02/2018; no red flags  Upper back pain - Exam consistent with muscle spasm which would explain related headaches. No tingling or weakness of UEs, despite known cervical DDD.  - Ordered flexeril, as this has worked for patient in the past. Counseled to take at bedtime to avoid daytime sedation. - Suggested heat therapy.  HM: Ordered FOBT testing, as patient declines colonoscopy for colon cancer screening. Patient declines low-dose CT screening for lung cancer.  Of note, patient previously said she was having trouble getting to cardiology appointments and was concerned about copay  but says will make appointment and that daughter can take her.   Follow-up in a couple weeks if no improvement, otherwise return to discuss pain in a few months.  Olene Floss,  MD [place], PGY-3

## 2018-01-07 NOTE — Assessment & Plan Note (Addendum)
-   Chronic, stable on current pain regimen. Discussed that surgery was definitive treatment but patient not interested because afraid of bad result and has already had multiple surgeries. Suggested trying PT again, but patient declines at this time.  Indication for chronic opioid:debilitating joint pain Medication and dose:hydrocodone-acetaminophen 5-325 mg # pills per month:45 Last UDS date:07/2017 Pain contract signed (Y/N):April 2014 Date narcotic database last reviewed (include red flags): 01/02/2018; no red flags

## 2018-01-07 NOTE — Assessment & Plan Note (Signed)
-   Exam consistent with muscle spasm which would explain related headaches. No tingling or weakness of UEs, despite known cervical DDD.  - Ordered flexeril, as this has worked for patient in the past. Counseled to take at bedtime to avoid daytime sedation. - Suggested heat therapy.

## 2018-01-08 ENCOUNTER — Other Ambulatory Visit: Payer: Self-pay | Admitting: *Deleted

## 2018-01-08 MED ORDER — COLCHICINE 0.6 MG PO TABS: 0.6000 mg | ORAL_TABLET | Freq: Every day | ORAL | 0 refills | Status: DC | PRN

## 2018-01-12 ENCOUNTER — Other Ambulatory Visit: Payer: Self-pay | Admitting: Internal Medicine

## 2018-01-26 ENCOUNTER — Other Ambulatory Visit: Payer: Self-pay | Admitting: Internal Medicine

## 2018-01-26 ENCOUNTER — Other Ambulatory Visit: Payer: Self-pay

## 2018-01-26 ENCOUNTER — Telehealth: Payer: Self-pay | Admitting: Physician Assistant

## 2018-01-26 ENCOUNTER — Telehealth: Payer: Self-pay

## 2018-01-26 NOTE — Telephone Encounter (Signed)
New message     Pt c/o Shortness Of Breath: STAT if SOB developed within the last 24 hours or pt is noticeably SOB on the phone  1. Are you currently SOB (can you hear that pt is SOB on the phone)? [place] health care nurse called this in   2. How long have you been experiencing SOB?  A week   3. Are you SOB when sitting or when up moving around?  With exertion   4. Are you currently experiencing any other symptoms? Tired and swelling in feet , gotten worse in the last week , weight is staying stable - patient is on fluid pill , please contact patient and follow up

## 2018-01-26 NOTE — Telephone Encounter (Signed)
Left message for Barnetta Chapel at [facility] to contact pt's PCP.  Pt has not been seen here since 10/17.  There is prior documentation stating patient does not wish to schedule appts here and will f/u and obtain refills from them.

## 2018-01-26 NOTE — Telephone Encounter (Signed)
Barnetta Chapel RN with [facility] calling to inform MD that patient has been having increased SHOB with activity, as well as lower extremity swelling. Tried to inform patients cardiologist and was told she hasn't been there since 2017 and to call our office. RN will be following up with patient but wanted information forwarded to MD for review. She recommends possible referral back to cardiology. Wallace Cullens, RN

## 2018-01-26 NOTE — Telephone Encounter (Signed)
This call should had been routed to Triage. I will route to Triage for further evaluation.

## 2018-01-29 ENCOUNTER — Telehealth: Payer: Self-pay | Admitting: Physician Assistant

## 2018-01-29 NOTE — Telephone Encounter (Signed)
Called to check in with patient. She says she is feeling much better. Her weight is down 4 lbs since the end of last week. She has been taking furosemide daily along with her spironolactone. She had not been taking lasix, as she thought she was out. She has made a follow-up appointment with me next week and with Cardiology, as well. Advised her to seek care sooner at [facility] if symptoms return.   Olene Floss, MD [place], PGY-3

## 2018-01-29 NOTE — Telephone Encounter (Signed)
Pt c/o swelling: STAT is pt has developed SOB within 24 hours  1) How much weight have you gained and in what time span? n/a  2) If swelling, where is the swelling located? Both legs and feet   3) Are you currently taking a fluid pill? yes  4) Are you currently SOB? no  5) Do you have a log of your daily weights (if so, list)? n/a  6) Have you gained 3 pounds in a day or 5 pounds in a week? n/a  7) Have you traveled recently? No  Patient has been having swelling/sob for about 2 weeks, states that her home health nurse advised her to call and f/u with [name]. Patient is currently scheduled for 02-26-18 and states that she is okay with date.

## 2018-01-29 NOTE — Telephone Encounter (Signed)
I s/w pt today to see how she was doing. I called to see if she needed a sooner appt.   Pt states she is 100% better, she can actually lay down flat to sleep and here breathing is fine, swelling in her legs and feet have gone down completely. I asked pt then did she feel she was good till 4/29 to see Nicki Reaper, pt answered yes. I advised pt to call the office if she feels worse again between now and her appt. Pt thanked me for the call today.

## 2018-01-31 ENCOUNTER — Other Ambulatory Visit: Payer: Self-pay | Admitting: Internal Medicine

## 2018-02-06 ENCOUNTER — Other Ambulatory Visit: Payer: Self-pay

## 2018-02-06 ENCOUNTER — Ambulatory Visit (INDEPENDENT_AMBULATORY_CARE_PROVIDER_SITE_OTHER): Payer: Medicare Other | Admitting: Internal Medicine

## 2018-02-06 ENCOUNTER — Encounter: Payer: Self-pay | Admitting: Internal Medicine

## 2018-02-06 VITALS — BP 126/70 | HR 61 | Temp 98.7°F | Ht 66.0 in | Wt 179.4 lb

## 2018-02-06 DIAGNOSIS — Z5181 Encounter for therapeutic drug level monitoring: Secondary | ICD-10-CM

## 2018-02-06 DIAGNOSIS — I5042 Chronic combined systolic (congestive) and diastolic (congestive) heart failure: Secondary | ICD-10-CM

## 2018-02-06 DIAGNOSIS — Z8639 Personal history of other endocrine, nutritional and metabolic disease: Secondary | ICD-10-CM | POA: Diagnosis not present

## 2018-02-06 DIAGNOSIS — I428 Other cardiomyopathies: Secondary | ICD-10-CM

## 2018-02-06 DIAGNOSIS — Z23 Encounter for immunization: Secondary | ICD-10-CM

## 2018-02-06 DIAGNOSIS — R432 Parageusia: Secondary | ICD-10-CM | POA: Diagnosis not present

## 2018-02-06 HISTORY — DX: Parageusia: R43.2

## 2018-02-06 MED ORDER — ZOSTER VAC RECOMB ADJUVANTED 50 MCG/0.5ML IM SUSR: 0.5000 mL | Freq: Once | INTRAMUSCULAR | 0 refills | Status: AC

## 2018-02-06 MED ORDER — FUROSEMIDE 20 MG PO TABS: ORAL_TABLET | ORAL | 2 refills | Status: DC

## 2018-02-06 MED ORDER — CARVEDILOL 6.25 MG PO TABS: 6.2500 mg | ORAL_TABLET | Freq: Two times a day (BID) | ORAL | 1 refills | Status: DC

## 2018-02-06 MED ORDER — SPIRONOLACTONE 25 MG PO TABS: 25.0000 mg | ORAL_TABLET | Freq: Every day | ORAL | 2 refills | Status: DC

## 2018-02-06 MED ORDER — LOSARTAN POTASSIUM 100 MG PO TABS: 100.0000 mg | ORAL_TABLET | Freq: Every day | ORAL | 2 refills | Status: DC

## 2018-02-06 NOTE — Progress Notes (Signed)
[facility] Progress Note  Subjective:  Citlalic Norlander is a 72 y.o. female with CHF(last EF 30-35% in 2017), possible COPD, HLD, cervical spondylosis and right hip replacement with fractured wire who presents for follow-up of breathing difficulties and change in taste.   #Breathing difficulty: - Patient had weight gain up to 186 lbs and increased leg swelling a little over a week ago - She reports she ran out of spironolactone and had improvement once she resumed this - She is taking lasix 20 mg daily - Reports breathing has gone back to normal for the last few days - Her insurance company monitors her status with a tablet through which she can report weight and symptoms ROS: No fevers, no cough  #Change in taste: - Has noticed for about the last year. She thinks this is related to quitting smoking. - Says has limited sense of smell and taste. Most foods end up tasting salty. - Had a white film on her tongue last year that resolved with nystatin treatment. Taste, however, did not improve. - Also notes sensation of thick mucus in her mouth. - Reports decreased appetite due to this ROS: No weight loss, no dysphagia  Allergies  Allergen Reactions  . Crab [Shellfish Allergy]     Itching to lips  . Morphine And Related Itching and Swelling    Social History   Tobacco Use  . Smoking status: Former Smoker    Packs/day: 0.50  . Smokeless tobacco: Current User  . Tobacco comment: decreased smoking/1 pack every 3 days  Substance Use Topics  . Alcohol use: Yes    Comment: on holidays    Objective: Blood pressure 126/70, pulse 61, temperature 98.7 F (37.1 C), temperature source Oral, height 5\' 6"  (1.676 m), weight 179 lb 6.4 oz (81.4 kg), SpO2 96 %. Body mass index is 28.96 kg/m. Constitutional: Well-appearing female, resting at edge of exam table on her crutches HENT: MMM, tongue appears normal Cardiovascular: RRR, S1, S2, no m/r/g.  Pulmonary/Chest:  Effort normal and breath sounds normal.  Musculoskeletal: No LE edema. Skin: Skin is warm and dry. No rash noted.  Psychiatric: Normal mood and affect.  Vitals reviewed  Assessment/Plan: Chronic combined systolic and diastolic CHF (congestive heart failure) (HCC) - Resolved exacerbation in setting of missed medication.  - Provided refills of carvedilol, spironolactone, lasix, and losartan. - Advised patient to continue to monitor daily weights. She correctly stated she knows to take extra lasix if her weight goes up a couple pounds in a day or several in a week.  - Will check CMP to monitor potassium and creatinine - Patient has follow-up with Cardiology at the end of this month  Loss of taste - Likely medication-related versus delayed return of taste after smoking versus normal aging. No red flag symptoms of weight loss or dysphagia that would be concerning for cancer.  - Will check for anemia and B12 deficiency. - Precepted with Dr. McDiarmid. Recommended trying biotene for dry mouth sensation. Could consider nutrition consult for strategies to make food more palatable.   Will call with lab results. Also checking vitamin D levels given history of deficiency.   HM: Ordered shingrix vaccine. Fecal Occult Blood Testing results performed by [employer] of patient's insurance; results should be sent to [facility] when available.   Follow-up in a couple months for chronic pain.  Olene Floss, MD [place], PGY-3

## 2018-02-06 NOTE — Assessment & Plan Note (Signed)
-   Likely medication-related versus delayed return of taste after smoking versus normal aging. No red flag symptoms of weight loss or dysphagia that would be concerning for cancer.  - Will check for anemia and B12 deficiency. - Precepted with Dr. McDiarmid. Recommended trying biotene for dry mouth sensation. Could consider nutrition consult for strategies to make food more palatable.

## 2018-02-06 NOTE — Assessment & Plan Note (Signed)
-   Resolved exacerbation in setting of missed medication.  - Provided refills of carvedilol, spironolactone, lasix, and losartan. - Advised patient to continue to monitor daily weights. She correctly stated she knows to take extra lasix if her weight goes up a couple pounds in a day or several in a week.  - Will check CMP to monitor potassium and creatinine - Patient has follow-up with Cardiology at the end of this month

## 2018-02-06 NOTE — Patient Instructions (Signed)
Donna Herrera,  I will check for electrolyte problems and anemia and B12 deficiency as cause of taste loss. You may want to try biotene for dry mouth. This is over-the-counter.  Please get the shingles vaccine when available at your pharmacy.  I refilled carvedilol, lasix, spironolactone, losartan. It is important to take all of these for your heart function.  Best, Dr. Ola Spurr

## 2018-02-07 LAB — CBC
Hematocrit: 40.7 % (ref 34.0–46.6)
Hemoglobin: 12.8 g/dL (ref 11.1–15.9)
MCH: 27.7 pg (ref 26.6–33.0)
MCHC: 31.4 g/dL — ABNORMAL LOW (ref 31.5–35.7)
MCV: 88 fL (ref 79–97)
PLATELETS: 209 10*3/uL (ref 150–379)
RBC: 4.62 x10E6/uL (ref 3.77–5.28)
RDW: 15.2 % (ref 12.3–15.4)
WBC: 3.3 10*3/uL — AB (ref 3.4–10.8)

## 2018-02-07 LAB — COMPREHENSIVE METABOLIC PANEL
ALT: 13 IU/L (ref 0–32)
AST: 23 IU/L (ref 0–40)
Albumin/Globulin Ratio: 1.5 (ref 1.2–2.2)
Albumin: 4.7 g/dL (ref 3.5–4.8)
Alkaline Phosphatase: 121 IU/L — ABNORMAL HIGH (ref 39–117)
BILIRUBIN TOTAL: 1.2 mg/dL (ref 0.0–1.2)
BUN/Creatinine Ratio: 11 — ABNORMAL LOW (ref 12–28)
BUN: 9 mg/dL (ref 8–27)
CHLORIDE: 99 mmol/L (ref 96–106)
CO2: 22 mmol/L (ref 20–29)
Calcium: 9.3 mg/dL (ref 8.7–10.3)
Creatinine, Ser: 0.84 mg/dL (ref 0.57–1.00)
GFR calc Af Amer: 80 mL/min/{1.73_m2} (ref >59)
GFR calc non Af Amer: 70 mL/min/{1.73_m2} (ref >59)
GLUCOSE: 88 mg/dL (ref 65–99)
Globulin, Total: 3.1 g/dL (ref 1.5–4.5)
POTASSIUM: 3.5 mmol/L (ref 3.5–5.2)
Sodium: 143 mmol/L (ref 134–144)
TOTAL PROTEIN: 7.8 g/dL (ref 6.0–8.5)

## 2018-02-07 LAB — VITAMIN D 25 HYDROXY (VIT D DEFICIENCY, FRACTURES): VIT D 25 HYDROXY: 15 ng/mL — AB (ref 30.0–100.0)

## 2018-02-07 LAB — VITAMIN B12: VITAMIN B 12: 590 pg/mL (ref 232–1245)

## 2018-02-12 ENCOUNTER — Other Ambulatory Visit: Payer: Self-pay | Admitting: Internal Medicine

## 2018-02-12 MED ORDER — VITAMIN D (ERGOCALCIFEROL) 1.25 MG (50000 UNIT) PO CAPS: 50000.0000 [IU] | ORAL_CAPSULE | ORAL | 0 refills | Status: DC

## 2018-02-22 ENCOUNTER — Encounter: Payer: Self-pay | Admitting: Internal Medicine

## 2018-02-25 ENCOUNTER — Emergency Department (HOSPITAL_COMMUNITY)
Admission: EM | Admit: 2018-02-25 | Discharge: 2018-02-25 | Disposition: A | Discharge status: Home / Self Care | Payer: Medicare Other | Attending: Emergency Medicine | Admitting: Emergency Medicine

## 2018-02-25 ENCOUNTER — Encounter (HOSPITAL_COMMUNITY): Payer: Self-pay | Admitting: Emergency Medicine

## 2018-02-25 ENCOUNTER — Emergency Department (HOSPITAL_COMMUNITY): Payer: Medicare Other

## 2018-02-25 DIAGNOSIS — Z7982 Long term (current) use of aspirin: Secondary | ICD-10-CM | POA: Insufficient documentation

## 2018-02-25 DIAGNOSIS — R0602 Shortness of breath: Secondary | ICD-10-CM

## 2018-02-25 DIAGNOSIS — J441 Chronic obstructive pulmonary disease with (acute) exacerbation: Secondary | ICD-10-CM | POA: Diagnosis not present

## 2018-02-25 DIAGNOSIS — F17228 Nicotine dependence, chewing tobacco, with other nicotine-induced disorders: Secondary | ICD-10-CM | POA: Diagnosis not present

## 2018-02-25 DIAGNOSIS — Z79899 Other long term (current) drug therapy: Secondary | ICD-10-CM | POA: Insufficient documentation

## 2018-02-25 DIAGNOSIS — I5043 Acute on chronic combined systolic (congestive) and diastolic (congestive) heart failure: Secondary | ICD-10-CM | POA: Insufficient documentation

## 2018-02-25 DIAGNOSIS — I110 Hypertensive heart disease with heart failure: Secondary | ICD-10-CM | POA: Diagnosis not present

## 2018-02-25 LAB — CBC
HCT: 39.4 % (ref 36.0–46.0)
Hemoglobin: 12.2 g/dL (ref 12.0–15.0)
MCH: 27.9 pg (ref 26.0–34.0)
MCHC: 31 g/dL (ref 30.0–36.0)
MCV: 90.2 fL (ref 78.0–100.0)
PLATELETS: 184 10*3/uL (ref 150–400)
RBC: 4.37 MIL/uL (ref 3.87–5.11)
RDW: 15.5 % (ref 11.5–15.5)
WBC: 5 10*3/uL (ref 4.0–10.5)

## 2018-02-25 LAB — BASIC METABOLIC PANEL
Anion gap: 14 (ref 5–15)
BUN: 13 mg/dL (ref 6–20)
CO2: 22 mmol/L (ref 22–32)
CREATININE: 0.76 mg/dL (ref 0.44–1.00)
Calcium: 9 mg/dL (ref 8.9–10.3)
Chloride: 104 mmol/L (ref 101–111)
GFR calc Af Amer: >60 mL/min (ref >60)
Glucose, Bld: 102 mg/dL — ABNORMAL HIGH (ref 65–99)
Potassium: 3.2 mmol/L — ABNORMAL LOW (ref 3.5–5.1)
SODIUM: 140 mmol/L (ref 135–145)

## 2018-02-25 LAB — I-STAT TROPONIN, ED: TROPONIN I, POC: 0 ng/mL (ref 0.00–0.08)

## 2018-02-25 MED ORDER — ALBUTEROL SULFATE HFA 108 (90 BASE) MCG/ACT IN AERS: 1.0000 | INHALATION_SPRAY | Freq: Four times a day (QID) | RESPIRATORY_TRACT | 0 refills | Status: DC | PRN

## 2018-02-25 MED ORDER — DOXYCYCLINE HYCLATE 100 MG PO CAPS: 100.0000 mg | ORAL_CAPSULE | Freq: Two times a day (BID) | ORAL | 0 refills | Status: DC

## 2018-02-25 MED ORDER — PREDNISONE 20 MG PO TABS: 20.0000 mg | ORAL_TABLET | Freq: Two times a day (BID) | ORAL | 0 refills | Status: DC

## 2018-02-25 MED ORDER — DOXYCYCLINE HYCLATE 100 MG PO TABS
100.0000 mg | ORAL_TABLET | Freq: Once | ORAL | Status: AC
Start: 2018-02-25 — End: 2018-02-25
  Administered 2018-02-25: 100 mg via ORAL
  Filled 2018-02-25: qty 1

## 2018-02-25 MED ORDER — BENZONATATE 100 MG PO CAPS: 100.0000 mg | ORAL_CAPSULE | Freq: Three times a day (TID) | ORAL | 0 refills | Status: DC

## 2018-02-25 MED ORDER — PREDNISONE 20 MG PO TABS
60.0000 mg | ORAL_TABLET | Freq: Once | ORAL | Status: AC
  Administered 2018-02-25: 60 mg via ORAL
  Filled 2018-02-25: qty 3

## 2018-02-25 MED ORDER — ALBUTEROL SULFATE (2.5 MG/3ML) 0.083% IN NEBU
2.5000 mg | INHALATION_SOLUTION | RESPIRATORY_TRACT | Status: DC | PRN
  Administered 2018-02-25: 2.5 mg via RESPIRATORY_TRACT
  Filled 2018-02-25: qty 3

## 2018-02-25 MED ORDER — BENZONATATE 100 MG PO CAPS
200.0000 mg | ORAL_CAPSULE | Freq: Once | ORAL | Status: AC
  Administered 2018-02-25: 200 mg via ORAL
  Filled 2018-02-25: qty 2

## 2018-02-25 NOTE — ED Provider Notes (Signed)
[facility] DEPT Provider Note   CSN: [id number] Arrival date & time: 02/25/18  0900     History   Chief Complaint Chief Complaint  Patient presents with  . Shortness of Breath  . Neck Pain  . Headache    HPI Donna Herrera is a 72 y.o. female.  Complaint is cough, shortness of breath.  HPI: This is a 72 year old female.  She is had a cough for several days.  She had a cough that kept her up during the night last night.  Dry nonproductive.  Does feel tight and wheezing.  No frank pain.  No hemoptysis.  No fever.  No lower extremity swelling.  Does have a history of CHF.  Is on her antihypertensives and diuretics.  She is compliant.  She has cardiology appointment tomorrow.  A routine follow-up appointment.  Past Medical History:  Diagnosis Date  . Allergy   . Arthritis   . Chronic combined systolic and diastolic CHF (congestive heart failure) ([place]) 10/21/2015   A. Echo 3/16: EF 40%, diffuse HK, mild MR, moderate LAE, mild RAE, PASP 42 mmHg  //  B. Echo 12/16: Mild LVH, EF 35-40%, diffuse HK, grade 2 diastolic dysfunction, MAC, mild MR, moderate LAE, normal RVSF, PASP 49 mmHg  . Chronic pain   . GERD (gastroesophageal reflux disease)   . Gout   . H/O slipped capital femoral epiphysis (SCFE)   . Hyperlipidemia   . Hypertension   . Hypertensive heart disease with CHF (congestive heart failure) ([place]) 10/12/2012  . NICM (nonischemic cardiomyopathy) ([place]) 06/09/2016   A. LHC 7/17: Normal coronary arteries, EF 30-35%, LVEDP 14 mmHg    Patient Active Problem List   Diagnosis Date Noted  . Loss of taste 02/06/2018  . Upper back pain 01/07/2018  . Candidiasis, mouth 10/19/2017  . Chronic pain syndrome 07/20/2017  . Essential hypertension 07/20/2017  . Tendonitis, Achilles, left 05/24/2017  . Depression 08/25/2016  . NICM (nonischemic cardiomyopathy) ([place]) 06/09/2016  . Chronic combined systolic and diastolic CHF (congestive heart  failure) ([place]) 10/21/2015  . Chronic lung disease   . Cough 10/03/2015  . Dyshidrotic eczema 12/31/2014  . Vitamin D deficiency 12/31/2014  . Tobacco abuse 09/29/2014  . Mass of breast, left 07/21/2014  . Hyperlipidemia 09/16/2013  . Chronic left shoulder pain 04/10/2013  . Right hip pain 11/02/2012  . Hypertensive heart disease with CHF (congestive heart failure) ([place]) 10/12/2012  . DJD (degenerative joint disease) 10/12/2012  . Gout 10/12/2012  . GERD (gastroesophageal reflux disease) 10/12/2012    Past Surgical History:  Procedure Laterality Date  . CARDIAC CATHETERIZATION  2005   normal-[place]  . CARDIAC CATHETERIZATION N/A 05/11/2016   Procedure: Right/Left Heart Cath and Coronary Angiography;  Surgeon: Peter M Martinique, MD;  Location: [facility];  Service: Cardiovascular;  Laterality: N/A;  . CESAREAN SECTION    . HIP FUSION     left   . [place]  . ROTATOR CUFF REPAIR     bilateral  . TOTAL HIP ARTHROPLASTY  1987   right. Dr. Collier Salina  . TUBAL LIGATION       OB History   None      Home Medications    Prior to Admission medications   Medication Sig Start Date End Date Taking? Authorizing Provider  albuterol (PROVENTIL HFA;VENTOLIN HFA) 108 (90 Base) MCG/ACT inhaler Inhale 1-2 puffs into the lungs every 6 (six) hours as needed for wheezing. 02/25/18  Tanna Furry, MD  aspirin 81 MG tablet Take 1 tablet (81 mg total) by mouth daily. 04/16/15   Leeanne Rio, MD  atorvastatin (LIPITOR) 10 MG tablet TAKE 1 TABLET BY MOUTH EVERY DAY 01/12/18   Rogue Bussing, MD  benzonatate (TESSALON) 100 MG capsule Take 1 capsule (100 mg total) by mouth every 8 (eight) hours. 02/25/18   Tanna Furry, MD  carvedilol (COREG) 6.25 MG tablet Take 1 tablet (6.25 mg total) by mouth 2 (two) times daily. 02/06/18   Rogue Bussing, MD  cetirizine (ZYRTEC) 10 MG tablet TAKE 1 TABLET (10 MG TOTAL) BY MOUTH DAILY. 03/06/17   McKeag,  Marylynn Pearson, MD  colchicine 0.6 MG tablet Take 1 tablet (0.6 mg total) by mouth daily as needed. Take 1 tablet daily as needed for Gouty flare. 01/08/18   Rogue Bussing, MD  cyclobenzaprine (FLEXERIL) 10 MG tablet Take 1 tablet (10 mg total) by mouth 2 (two) times daily as needed for muscle spasms. 01/04/18   Rogue Bussing, MD  doxycycline (VIBRAMYCIN) 100 MG capsule Take 1 capsule (100 mg total) by mouth 2 (two) times daily. 02/25/18   Tanna Furry, MD  FLUoxetine (PROZAC) 20 MG tablet Take 1 tablet (20 mg total) by mouth daily. 12/14/17   Rogue Bussing, MD  furosemide (LASIX) 20 MG tablet TAKE 1 TABLET (20 MG TOTAL) BY MOUTH DAILY. 02/06/18   Rogue Bussing, MD  HYDROcodone-acetaminophen (NORCO/VICODIN) 5-325 MG tablet Take 1 tablet by mouth 2 (two) times daily as needed for moderate pain. 01/04/18   Rogue Bussing, MD  losartan (COZAAR) 100 MG tablet Take 1 tablet (100 mg total) by mouth daily. 02/06/18   Rogue Bussing, MD  meloxicam (MOBIC) 15 MG tablet TAKE 1 TABLET (15 MG TOTAL) BY MOUTH DAILY AS NEEDED FOR PAIN. 01/31/18   Rogue Bussing, MD  pantoprazole (PROTONIX) 20 MG tablet TAKE 2 TABLETS (40 MG TOTAL) BY MOUTH DAILY. 06/28/17   Rogue Bussing, MD  predniSONE (DELTASONE) 20 MG tablet Take 1 tablet (20 mg total) by mouth 2 (two) times daily with a meal. 02/25/18   Tanna Furry, MD  spironolactone (ALDACTONE) 25 MG tablet Take 1 tablet (25 mg total) by mouth daily. 02/06/18   Rogue Bussing, MD  triamcinolone ointment (KENALOG) 0.5 % Apply 1 application topically 2 (two) times daily. To palms 12/14/16   McKeag, Marylynn Pearson, MD  Vitamin D, Ergocalciferol, (DRISDOL) 50000 units CAPS capsule Take 1 capsule (50,000 Units total) by mouth every 7 (seven) days. After 8 weeks, take over-the-counter vitamin D 2000 units daily. 02/12/18   Rogue Bussing, MD    Family History Family History  Problem Relation Age of Onset  .  Depression Mother   . Diabetes Mother   . Hypertension Mother   . Stroke Mother   . Heart disease Mother   . Cancer Father        unknown primary  . Diabetes Sister   . Breast cancer Neg Hx     Social History Social History   Tobacco Use  . Smoking status: Former Smoker    Packs/day: 0.50  . Smokeless tobacco: Current User  . Tobacco comment: decreased smoking/1 pack every 3 days  Substance Use Topics  . Alcohol use: Yes    Comment: on holidays  . Drug use: No     Allergies   Crab [shellfish allergy] and Morphine and related   Review of Systems Review of Systems  Constitutional:  Negative for appetite change, chills, diaphoresis, fatigue and fever.  HENT: Negative for mouth sores, sore throat and trouble swallowing.   Eyes: Negative for visual disturbance.  Respiratory: Positive for cough and wheezing. Negative for chest tightness and shortness of breath.   Cardiovascular: Negative for chest pain.  Gastrointestinal: Negative for abdominal distention, abdominal pain, diarrhea, nausea and vomiting.  Endocrine: Negative for polydipsia, polyphagia and polyuria.  Genitourinary: Negative for dysuria, frequency and hematuria.  Musculoskeletal: Negative for gait problem.  Skin: Negative for color change, pallor and rash.  Neurological: Negative for dizziness, syncope, light-headedness and headaches.  Hematological: Does not bruise/bleed easily.  Psychiatric/Behavioral: Negative for behavioral problems and confusion.     Physical Exam Updated Vital Signs BP (!) 150/98   Pulse 60   Temp 98.1 F (36.7 C) (Oral)   Resp 13   Ht _0  (1.676 m)   Wt 83 kg (183 lb)   SpO2 99%   BMI 29.54 kg/m   Physical Exam  Constitutional: She is oriented to person, place, and time. She appears well-developed and well-nourished. No distress.  HENT:  Head: Normocephalic.  Eyes: Pupils are equal, round, and reactive to light. Conjunctivae are normal. No scleral icterus.  Neck: Normal  range of motion. Neck supple. No thyromegaly present.  Cardiovascular: Normal rate and regular rhythm. Exam reveals no gallop and no friction rub.  No murmur heard. Pulmonary/Chest: Effort normal and breath sounds normal. No respiratory distress. She has no wheezes. She has no rales.  Wheezing and mild prolongation with cough.  No wheezing or elongation at rest.  No rhonchi or asymmetry.  Abdominal: Soft. Bowel sounds are normal. She exhibits no distension. There is no tenderness. There is no rebound.  Musculoskeletal: Normal range of motion.  Neurological: She is alert and oriented to person, place, and time.  Skin: Skin is warm and dry. No rash noted.  Psychiatric: She has a normal mood and affect. Her behavior is normal.     ED Treatments / Results  Labs (all labs ordered are listed, but only abnormal results are displayed) Labs Reviewed  BASIC METABOLIC PANEL - Abnormal; Notable for the following components:      Result Value   Potassium 3.2 (*)    Glucose, Bld 102 (*)    All other components within normal limits  CBC  I-STAT TROPONIN, ED    EKG EKG Interpretation  Date/Time:  Sunday February 25 2018 09:12:50 EDT Ventricular Rate:  70 PR Interval:    QRS Duration: 103 QT Interval:  431 QTC Calculation: 466 R Axis:   -27 Text Interpretation:  Sinus rhythm Multiform ventricular premature complexes Probable left ventricular hypertrophy Nonspecific T abnormalities, lateral leads Confirmed by Tanna Furry [id number]) on 02/25/2018 12:19:38 PM   Radiology Dg Chest 2 View  Result Date: 02/25/2018 CLINICAL DATA:  Shortness of breath for 2 days EXAM: CHEST - 2 VIEW COMPARISON:  Mar 26, 2017 FINDINGS: No pneumothorax. Stable cardiomegaly. The hila and mediastinum are normal. No pulmonary nodules, masses, focal infiltrates, or overt edema. IMPRESSION: No active cardiopulmonary disease. Electronically Signed   By: Dorise Bullion III M.D   On: 02/25/2018 09:57    Procedures Procedures  (including critical care time)  Medications Ordered in ED Medications  albuterol (PROVENTIL) (2.5 MG/3ML) 0.083% nebulizer solution 2.5 mg (2.5 mg Nebulization Given 02/25/18 1236)  predniSONE (DELTASONE) tablet 60 mg (60 mg Oral Given 02/25/18 1236)  benzonatate (TESSALON) capsule 200 mg (200 mg Oral Given 02/25/18 1236)  doxycycline (VIBRA-TABS) tablet  100 mg (100 mg Oral Given 02/25/18 1236)     Initial Impression / Assessment and Plan / ED Course  I have reviewed the triage vital signs and the nursing notes.  Pertinent labs & imaging results that were available during my care of the patient were reviewed by me and considered in my medical decision making (see chart for details).    No dependent edema.  No JVD or gallop.  No clinical signs of suggest CHF.  Radiographically does not appear in CHF.  She previously smoked.  She quit a year ago.  Is never had a formal diagnosis of COPD.  Likely has an acute bronchitis with bronchospasm now.  Plan treatment for COPD exacerbation doxycycline, prednisone, albuterol, Tessalon.  Final Clinical Impressions(s) / ED Diagnoses   Final diagnoses:  Shortness of breath  COPD exacerbation [facility])    ED Discharge Orders        Ordered    albuterol (PROVENTIL HFA;VENTOLIN HFA) 108 (90 Base) MCG/ACT inhaler  Every 6 hours PRN     02/25/18 1514    predniSONE (DELTASONE) 20 MG tablet  2 times daily with meals     02/25/18 1514    doxycycline (VIBRAMYCIN) 100 MG capsule  2 times daily     02/25/18 1514    benzonatate (TESSALON) 100 MG capsule  Every 8 hours     02/25/18 1514       Tanna Furry, MD 02/25/18 [phone]

## 2018-02-25 NOTE — ED Triage Notes (Signed)
Pt c/o SOb for couple days. reports that SOB is worse at night when trying to sleep. Pt reports headache and side of her neck pains that been going on all night.

## 2018-02-25 NOTE — ED Notes (Signed)
Bed: WA07 Expected date:  Expected time:  Means of arrival:  Comments: 

## 2018-02-25 NOTE — Discharge Instructions (Addendum)
Follow-up with your primary care physician if not improving.

## 2018-02-25 NOTE — ED Triage Notes (Signed)
Pt adds that for past day or two she has cough with phlegm. Pt has HX CHF per family. Pt reports knot on side of her neck that comes and goes with headaches.

## 2018-02-26 ENCOUNTER — Ambulatory Visit: Payer: Medicare Other | Admitting: Physician Assistant

## 2018-02-26 ENCOUNTER — Encounter: Payer: Self-pay | Admitting: Physician Assistant

## 2018-02-26 VITALS — BP 130/90 | HR 83 | Ht 66.0 in | Wt 183.0 lb

## 2018-02-26 DIAGNOSIS — I5042 Chronic combined systolic (congestive) and diastolic (congestive) heart failure: Secondary | ICD-10-CM | POA: Diagnosis not present

## 2018-02-26 DIAGNOSIS — I110 Hypertensive heart disease with heart failure: Secondary | ICD-10-CM | POA: Diagnosis not present

## 2018-02-26 DIAGNOSIS — I5043 Acute on chronic combined systolic (congestive) and diastolic (congestive) heart failure: Secondary | ICD-10-CM

## 2018-02-26 DIAGNOSIS — R0789 Other chest pain: Secondary | ICD-10-CM

## 2018-02-26 MED ORDER — FUROSEMIDE 20 MG PO TABS: ORAL_TABLET | ORAL | 3 refills | Status: DC

## 2018-02-26 MED ORDER — POTASSIUM CHLORIDE CRYS ER 20 MEQ PO TBCR: 20.0000 meq | EXTENDED_RELEASE_TABLET | Freq: Every day | ORAL | 2 refills | Status: DC

## 2018-02-26 NOTE — Patient Instructions (Addendum)
Medication Instructions:  Your physician has recommended you make the following change in your medication: 1.  INCREASE the Lasix to 20 mg taking 2 tablets by mouth daily for 3 days only then go back to taking 1 tablet by mouth daily 2.  START Potassium 20 meq taking 1 tablet by mouth for 3 days only then STOP  (IT IS OK FOR YOU TO START TAKING THE ANTIBIOTICS & PREDNISONE)   Labwork: 1 WEEK:  PRO BNP & BMET (SAME DAY AS YOU HAVE ECHO)  Testing/Procedures: Your physician has requested that you have an echocardiogram 1 WEEK OR SO . Echocardiography is a painless test that uses sound waves to create images of your heart. It provides your doctor with information about the size and shape of your heart and how well your heart's chambers and valves are working. This procedure takes approximately one hour. There are no restrictions for this procedure.    Follow-Up: Your physician recommends that you schedule a follow-up appointment in: 03/20/18 ARRIVE AT 11:00 TO SEE SCOTT WEAVER, PA-C    Any Other Special Instructions Will Be Listed Below (If Applicable). CALL YOUR PHARMACY TO SEE IF YOUR [place] IS ON THE RECALL LIST    Echocardiogram An echocardiogram, or echocardiography, uses sound waves (ultrasound) to produce an image of your heart. The echocardiogram is simple, painless, obtained within a short period of time, and offers valuable information to your health care provider. The images from an echocardiogram can provide information such as:  Evidence of coronary artery disease (CAD).  Heart size.  Heart muscle function.  Heart valve function.  Aneurysm detection.  Evidence of a past heart attack.  Fluid buildup around the heart.  Heart muscle thickening.  Assess heart valve function.  Tell a health care provider about:  Any allergies you have.  All medicines you are taking, including vitamins, herbs, eye drops, creams, and over-the-counter medicines.  Any problems you  or family members have had with anesthetic medicines.  Any blood disorders you have.  Any surgeries you have had.  Any medical conditions you have.  Whether you are pregnant or may be pregnant. What happens before the procedure? No special preparation is needed. Eat and drink normally. What happens during the procedure?  In order to produce an image of your heart, gel will be applied to your chest and a wand-like tool (transducer) will be moved over your chest. The gel will help transmit the sound waves from the transducer. The sound waves will harmlessly bounce off your heart to allow the heart images to be captured in real-time motion. These images will then be recorded.  You may need an IV to receive a medicine that improves the quality of the pictures. What happens after the procedure? You may return to your normal schedule including diet, activities, and medicines, unless your health care provider tells you otherwise. This information is not intended to replace advice given to you by your health care provider. Make sure you discuss any questions you have with your health care provider. Document Released: 10/14/2000 Document Revised: 06/04/2016 Document Reviewed: 06/24/2013 Elsevier Interactive Patient Education  2017 [employer].  If you need a refill on your cardiac medications before your next appointment, please call your pharmacy.

## 2018-02-26 NOTE — Progress Notes (Signed)
Cardiology Office Note:    Date:  02/26/2018   ID:  Donna Herrera, DOB [birth year], MRN [id number]  PCP:  Rogue Bussing, MD  Cardiologist:  Dorris Carnes, MD   Referring MD: Larey Seat*   Chief Complaint  Patient presents with  . Shortness of Breath    History of Present Illness:    Donna Herrera is a 72 y.o. female with systolic heart failure secondary to dilated cardiomyopathy with EF 35-40%, tobacco abuse, hypertension, hyperlipidemia.  Cardiac catheterization in July 2017 demonstrated no CAD.  She was last seen in this office 08/01/2016.  She was seen by primary care earlier this month due to shortness of breath and weight gain.  She has been out of her spironolactone and Lasix.  Her symptoms improved after resuming this.  She was then seen in the emergency room yesterday and diagnosed with COPD exacerbation.  She was placed on antibiotic therapy, prednisone and albuterol.  Troponin was negative.  ECG demonstrated normal sinus rhythm, heart rate 70, left axis deviation, PVC, QTC 466.    [name] returns for follow-up.  She is here with her granddaughter.  Over the last several weeks, she has noted increased cough as well as increasing shortness of breath.  She notes orthopnea as well as PND.  Since resuming Lasix and spinal lactone, her lower extremity swelling has improved.  She notes occasional chest discomfort that started when her cough started.  It is described as an ache.  She denies any associated nausea.  She does have some right shoulder pain as well as some neck discomfort.  She quit smoking about a year ago.  She denies any bleeding issues.  Prior CV studies:   The following studies were reviewed today:  LHC 05/11/16 1. Normal coronary anatomy 2. Severe LV dysfunction with EF 30-35%. 3. Normal right heart and LV filling pressures.  Echo 10/05/15 Mild LVH, EF 35-40%, diffuse HK, grade 2 diastolic dysfunction, MAC, mild MR,  moderate LAE, normal RVSF, PASP 49 mmHg  Echo 01/16/15 EF 40%, diffuse HK, mild MR, moderate LAE, mild RAE, PASP 42 mmHg  Past Medical History:  Diagnosis Date  . Allergy   . Arthritis   . Chronic combined systolic and diastolic CHF (congestive heart failure) ([place]) 10/21/2015   A. Echo 3/16: EF 40%, diffuse HK, mild MR, moderate LAE, mild RAE, PASP 42 mmHg  //  B. Echo 12/16: Mild LVH, EF 35-40%, diffuse HK, grade 2 diastolic dysfunction, MAC, mild MR, moderate LAE, normal RVSF, PASP 49 mmHg  . Chronic pain   . GERD (gastroesophageal reflux disease)   . Gout   . H/O slipped capital femoral epiphysis (SCFE)   . Hyperlipidemia   . Hypertension   . Hypertensive heart disease with CHF (congestive heart failure) ([place]) 10/12/2012  . NICM (nonischemic cardiomyopathy) ([place]) 06/09/2016   A. LHC 7/17: Normal coronary arteries, EF 30-35%, LVEDP 14 mmHg   Surgical Hx: The patient  has a past surgical history that includes Cesarean section; Tubal ligation; Total hip arthroplasty (1987); Joint replacement (1991, 1997); Hip fusion; Rotator cuff repair; Cardiac catheterization (2005); and Cardiac catheterization (N/A, 05/11/2016).   Current Medications: Current Meds  Medication Sig  . albuterol (PROVENTIL HFA;VENTOLIN HFA) 108 (90 Base) MCG/ACT inhaler Inhale 1-2 puffs into the lungs every 6 (six) hours as needed for wheezing.  [name] aspirin 81 MG tablet Take 1 tablet (81 mg total) by mouth daily.  [name] atorvastatin (LIPITOR) 10 MG tablet TAKE 1 TABLET BY MOUTH EVERY  DAY  . benzonatate (TESSALON) 100 MG capsule Take 1 capsule (100 mg total) by mouth every 8 (eight) hours.  . carvedilol (COREG) 6.25 MG tablet Take 1 tablet (6.25 mg total) by mouth 2 (two) times daily.  . cetirizine (ZYRTEC) 10 MG tablet TAKE 1 TABLET (10 MG TOTAL) BY MOUTH DAILY.  [name] colchicine 0.6 MG tablet Take 1 tablet (0.6 mg total) by mouth daily as needed. Take 1 tablet daily as needed for Gouty flare.  . cyclobenzaprine (FLEXERIL) 10  MG tablet Take 1 tablet (10 mg total) by mouth 2 (two) times daily as needed for muscle spasms.  [name] doxycycline (VIBRAMYCIN) 100 MG capsule Take 1 capsule (100 mg total) by mouth 2 (two) times daily.  [name] FLUoxetine (PROZAC) 20 MG tablet Take 1 tablet (20 mg total) by mouth daily.  [name] HYDROcodone-acetaminophen (NORCO/VICODIN) 5-325 MG tablet Take 1 tablet by mouth 2 (two) times daily as needed for moderate pain.  [name] losartan (COZAAR) 100 MG tablet Take 1 tablet (100 mg total) by mouth daily.  . meloxicam (MOBIC) 15 MG tablet TAKE 1 TABLET (15 MG TOTAL) BY MOUTH DAILY AS NEEDED FOR PAIN.  [name] pantoprazole (PROTONIX) 20 MG tablet TAKE 2 TABLETS (40 MG TOTAL) BY MOUTH DAILY.  [name] predniSONE (DELTASONE) 20 MG tablet Take 1 tablet (20 mg total) by mouth 2 (two) times daily with a meal.  . spironolactone (ALDACTONE) 25 MG tablet Take 1 tablet (25 mg total) by mouth daily.  [name] triamcinolone ointment (KENALOG) 0.5 % Apply 1 application topically 2 (two) times daily. To palms  . Vitamin D, Ergocalciferol, (DRISDOL) 50000 units CAPS capsule Take 1 capsule (50,000 Units total) by mouth every 7 (seven) days. After 8 weeks, take over-the-counter vitamin D 2000 units daily.  . [DISCONTINUED] furosemide (LASIX) 20 MG tablet TAKE 1 TABLET (20 MG TOTAL) BY MOUTH DAILY.     Allergies:   Crab [shellfish allergy] and Morphine and related   Social History   Tobacco Use  . Smoking status: Former Smoker    Packs/day: 0.50  . Smokeless tobacco: Current User  . Tobacco comment: decreased smoking/1 pack every 3 days  Substance Use Topics  . Alcohol use: Yes    Comment: on holidays  . Drug use: No     Family Hx: The patient's family history includes Cancer in her father; Depression in her mother; Diabetes in her mother and sister; Heart disease in her mother; Hypertension in her mother; Stroke in her mother. There is no history of Breast cancer.  ROS:   Please see the history of present illness.    ROS All other  systems reviewed and are negative.   EKGs/Labs/Other Test Reviewed:    EKG:  EKG is not ordered today.    Recent Labs: 03/26/2017: B Natriuretic Peptide 792.1 02/06/2018: ALT 13 02/25/2018: BUN 13; Creatinine, Ser 0.76; Hemoglobin 12.2; Platelets 184; Potassium 3.2; Sodium 140   Recent Lipid Panel Lab Results  Component Value Date/Time   CHOL 177 10/04/2015 01:41 AM   TRIG 78 10/04/2015 01:41 AM   HDL 50 10/04/2015 01:41 AM   CHOLHDL 3.5 10/04/2015 01:41 AM   LDLCALC 111 (H) 10/04/2015 01:41 AM    Physical Exam:    VS:  BP 130/90 (BP Location: Right Arm, Patient Position: Sitting)   Pulse 83   Ht 5' 6"  (1.676 m)   Wt 183 lb (83 kg)   SpO2 98%   BMI 29.54 kg/m     Wt Readings from Last 3 Encounters:  02/26/18 183 lb (83 kg)  02/25/18 183 lb (83 kg)  02/06/18 179 lb 6.4 oz (81.4 kg)     Physical Exam  Constitutional: She is oriented to person, place, and time. She appears well-developed and well-nourished. No distress.  HENT:  Head: Normocephalic and atraumatic.  Neck: Neck supple. JVD (JVP 6-7 cm) present.  Cardiovascular: Normal rate, regular rhythm, S1 normal, S2 normal and normal heart sounds.  No murmur heard. Pulmonary/Chest: Effort normal. She has decreased breath sounds. She has no rales.  Abdominal: Soft. There is no hepatomegaly.  Musculoskeletal: She exhibits no edema.  Neurological: She is alert and oriented to person, place, and time.  Skin: Skin is warm and dry.    ASSESSMENT & PLAN:    Acute on Chronic combined systolic and diastolic CHF (congestive heart failure) (HCC) EF 35-40 with moderate diastolic dysfunction by echo in December 2016.  Chest x-ray in the emergency room yesterday did not demonstrate edema.  However, she does appear to be somewhat volume overloaded.  I suspect her volume status has worsened in the setting of COPD exacerbation.  -Increase Lasix to 40 mg daily x3 days, then resume 20 mg daily  -Add potassium 20 medical once daily x3  days, then stop  -BMET, BNP 1 week  -Arrange follow-up echocardiogram  Hypertensive heart disease with chronic combined systolic and diastolic congestive heart failure ([place]) Blood pressure somewhat elevated.  Adjust Lasix as noted.  Consider changing ARB to Entresto at follow-up.  Chest pain I suspect that this is related to COPD exacerbation.  She has not yet started antibiotics or prednisone yet.  I have encouraged her to go ahead and start this today.  Obtain follow-up echocardiogram as noted.  She had normal coronaries by cardiac catheterization 2017.  At this point, I do not suspect that she needs an ischemic work-up.  If she continues to have chest discomfort, consider stress testing.   Dispo:  Return in about 1 month (around 03/26/2018) for Follow up after testing, w/ [name], PA-C.   Medication Adjustments/Labs and Tests Ordered: Current medicines are reviewed at length with the patient today.  Concerns regarding medicines are outlined above.  Tests Ordered: Orders Placed This Encounter  Procedures  . Basic metabolic panel  . Pro b natriuretic peptide (BNP)  . ECHOCARDIOGRAM COMPLETE   Medication Changes: Meds ordered this encounter  Medications  . furosemide (LASIX) 20 MG tablet    Sig: TAKE 2 TABLETS BY MOUTH DAILY FOR 3 DAYS THEN GO BACK TO 1 TABLET BY  MOUTH DAILY    Dispense:  96 tablet    Refill:  3  . potassium chloride SA (K-DUR,KLOR-CON) 20 MEQ tablet    Sig: Take 1 tablet (20 mEq total) by mouth daily.    Dispense:  30 tablet    Refill:  2    Signed, [name], PA-C  02/26/2018 [facility] [place], [place], [place]  [address] Phone: [phone]; Fax: [phone]

## 2018-02-28 ENCOUNTER — Encounter: Payer: Self-pay | Admitting: Physician Assistant

## 2018-03-05 ENCOUNTER — Telehealth: Payer: Self-pay | Admitting: Physician Assistant

## 2018-03-05 NOTE — Telephone Encounter (Signed)
New Message:     Curt Bears from [facility] is calling to let us know that the Pt is in heart health program and pt has dropped 5.2 lbs in 3 days. Pt is not having any other signs and is not dehydrated.

## 2018-03-05 NOTE — Telephone Encounter (Signed)
This is great nurse from the [place]. I will forward information to [place] as Juluis Rainier.

## 2018-03-06 ENCOUNTER — Other Ambulatory Visit: Payer: Medicare Other

## 2018-03-06 ENCOUNTER — Other Ambulatory Visit: Payer: Self-pay

## 2018-03-06 ENCOUNTER — Ambulatory Visit (HOSPITAL_COMMUNITY): Payer: Medicare Other | Attending: Cardiovascular Disease

## 2018-03-06 DIAGNOSIS — I429 Cardiomyopathy, unspecified: Secondary | ICD-10-CM | POA: Diagnosis not present

## 2018-03-06 DIAGNOSIS — I071 Rheumatic tricuspid insufficiency: Secondary | ICD-10-CM | POA: Insufficient documentation

## 2018-03-06 DIAGNOSIS — I110 Hypertensive heart disease with heart failure: Secondary | ICD-10-CM | POA: Diagnosis not present

## 2018-03-06 DIAGNOSIS — Z87891 Personal history of nicotine dependence: Secondary | ICD-10-CM | POA: Insufficient documentation

## 2018-03-06 DIAGNOSIS — I5043 Acute on chronic combined systolic (congestive) and diastolic (congestive) heart failure: Secondary | ICD-10-CM

## 2018-03-07 ENCOUNTER — Telehealth: Payer: Self-pay | Admitting: *Deleted

## 2018-03-07 ENCOUNTER — Other Ambulatory Visit: Payer: Self-pay | Admitting: Internal Medicine

## 2018-03-07 ENCOUNTER — Encounter: Payer: Self-pay | Admitting: Physician Assistant

## 2018-03-07 DIAGNOSIS — I5043 Acute on chronic combined systolic (congestive) and diastolic (congestive) heart failure: Secondary | ICD-10-CM

## 2018-03-07 LAB — BASIC METABOLIC PANEL
BUN / CREAT RATIO: 15 (ref 12–28)
BUN: 13 mg/dL (ref 8–27)
CO2: 27 mmol/L (ref 20–29)
Calcium: 9.2 mg/dL (ref 8.7–10.3)
Chloride: 98 mmol/L (ref 96–106)
Creatinine, Ser: 0.87 mg/dL (ref 0.57–1.00)
GFR, EST AFRICAN AMERICAN: 77 mL/min/{1.73_m2} (ref >59)
GFR, EST NON AFRICAN AMERICAN: 67 mL/min/{1.73_m2} (ref >59)
Glucose: 96 mg/dL (ref 65–99)
Potassium: 3.7 mmol/L (ref 3.5–5.2)
SODIUM: 142 mmol/L (ref 134–144)

## 2018-03-07 LAB — PRO B NATRIURETIC PEPTIDE: NT-Pro BNP: 3067 pg/mL — ABNORMAL HIGH (ref 0–301)

## 2018-03-07 MED ORDER — SACUBITRIL-VALSARTAN 24-26 MG PO TABS: 1.0000 | ORAL_TABLET | Freq: Two times a day (BID) | ORAL | 3 refills | Status: DC

## 2018-03-07 MED ORDER — FUROSEMIDE 20 MG PO TABS: 40.0000 mg | ORAL_TABLET | Freq: Every day | ORAL | 3 refills | Status: DC

## 2018-03-07 NOTE — Telephone Encounter (Signed)
-----   Message from Liliane Shi, PA-C sent at 03/07/2018  2:22 PM EDT ----- Renal function, potassium normal.  BNP high. PLAN:  1. See echo results (changing Losartan to Entresto) 2. Increase Lasix to 40 mg Once daily  3. Start K+ 20 mEq Once daily  4. Get BMET at next office visit.  [name], PA-C    03/07/2018 2:20 PM

## 2018-03-07 NOTE — Telephone Encounter (Signed)
Pt has been notified of lab and echo results by phone with verbal understanding. Pt is agreeable to plan of care. Increase lasix to 40 mg daily, pt states she is still taking K+ 20 meq, pt advised per PA to continue on K+ 20 meq daily, d/c Losartan, start Entresto 24/26 mg BID (Rx sent in). BMET at 03/20/18 appt. Pt advised to monitor weight and call the office if wt gain 3 lb's x 1 day or 5 lb's x 1 week. Pt and her daughter thanked me for the call.

## 2018-03-07 NOTE — Telephone Encounter (Signed)
[place], [place]    03/07/2018 2:22 PM

## 2018-03-09 ENCOUNTER — Other Ambulatory Visit: Payer: Self-pay | Admitting: Internal Medicine

## 2018-03-13 ENCOUNTER — Telehealth: Payer: Self-pay | Admitting: Physician Assistant

## 2018-03-13 NOTE — Telephone Encounter (Signed)
New message:     Donna Herrera is calling to give and update on pt's weight. She states pt has lost 6.8 lbs in 8 days. She states pt has no complaints at this time. She states she would also like the results from pt's last echo and bp

## 2018-03-14 ENCOUNTER — Other Ambulatory Visit: Payer: Self-pay

## 2018-03-14 MED ORDER — HYDROCODONE-ACETAMINOPHEN 5-325 MG PO TABS: 1.0000 | ORAL_TABLET | Freq: Two times a day (BID) | ORAL | 0 refills | Status: DC | PRN

## 2018-03-14 NOTE — Telephone Encounter (Signed)
Patient left message requesting refill of pain med for hip.  Call back is [phone].   Danley Danker, RN [facility] RN)

## 2018-03-14 NOTE — Telephone Encounter (Signed)
I will route to [name], [place] as Donna Herrera as well as if any further recommendations.

## 2018-03-14 NOTE — Telephone Encounter (Signed)
[place]. The patient will need to request health information to be released to this person.  We cannot release this unless the patient gives permission.  [name], PA-C    03/14/2018 5:26 PM

## 2018-03-15 NOTE — Telephone Encounter (Signed)
Patient states she felt lightheaded and a little dizzy this morning when the [facility] called.  She had just gotten up, hadn't had breakfast or anything to drink at that point.  She said "I feel fine now".  But she reports cramping today--Her K+ was 3.7 on last check. Furosemide was increased to 40 mg and pt continued to take same amount of 20 meq potassium.    She has no swelling. Weight has gone from 183 down to "160 something" She has bananas and V8 at home.  Advised her to have both today and tomorrow. Also advised to drink if she is thirsty for now and to wait until she hears back from [name] tomorrow before taking lasix.  Pt verbalizes understanding and agreement.

## 2018-03-15 NOTE — Telephone Encounter (Signed)
New message  Curt Bears from [facility] verbalzied that she is calling for [employer]  Or his RN  to report pt is in the [place] program and that pt weight has decreased                                (no SmartPhrase for decreased weight)  Curt Bears verbalized that pt loss is 5.8 pounds in 9 days and also loss 2.6 pounds in 2 days  She verbalized that pt complaints are dizziness (pt was not on the call to do a syncope SmartPhrase)  Curt Bears verbalized that she is worried that the pt is dehydrated   Please call pt

## 2018-03-15 NOTE — Telephone Encounter (Signed)
Hold Lasix x 1 day Then resume Lasix at 20 mg Once daily  BMET Tuesday at follow up appointment  [name], PA-C    03/15/2018 8:14 PM

## 2018-03-16 MED ORDER — FUROSEMIDE 20 MG PO TABS: 20.0000 mg | ORAL_TABLET | Freq: Every day | ORAL | 3 refills | Status: DC

## 2018-03-16 NOTE — Telephone Encounter (Signed)
I called pt and went over recommendations per [name], PA. Pt has been advised to HOLD lasix today, beginning Saturday 5/18 she will change lasix to 20 mg daily. Pt has appt 5/21 with PA with BMET to be obtained at appt.  I did advise pt if she feels worse over the weekend sh can call the office # [phone] and she will be able to s/w the ON-Call Provider or she can go to the ED. Pt verbalized understanding to plan of care.   I then called Curt Bears, RN with [facility]. Updated RN as to plan of care for the pt. Curt Bears was thankful for the update.

## 2018-03-20 ENCOUNTER — Ambulatory Visit (INDEPENDENT_AMBULATORY_CARE_PROVIDER_SITE_OTHER): Payer: Medicare Other | Admitting: Physician Assistant

## 2018-03-20 ENCOUNTER — Encounter (INDEPENDENT_AMBULATORY_CARE_PROVIDER_SITE_OTHER): Payer: Self-pay

## 2018-03-20 ENCOUNTER — Encounter: Payer: Self-pay | Admitting: Physician Assistant

## 2018-03-20 VITALS — BP 120/64 | HR 70 | Ht 66.0 in | Wt 169.5 lb

## 2018-03-20 DIAGNOSIS — I272 Pulmonary hypertension, unspecified: Secondary | ICD-10-CM

## 2018-03-20 DIAGNOSIS — I110 Hypertensive heart disease with heart failure: Secondary | ICD-10-CM

## 2018-03-20 DIAGNOSIS — I5042 Chronic combined systolic (congestive) and diastolic (congestive) heart failure: Secondary | ICD-10-CM | POA: Diagnosis not present

## 2018-03-20 DIAGNOSIS — E785 Hyperlipidemia, unspecified: Secondary | ICD-10-CM

## 2018-03-20 NOTE — Progress Notes (Signed)
Cardiology Office Note:    Date:  03/20/2018   ID:  Donna Herrera, DOB [birth year], MRN [id number]  PCP:  Rogue Bussing, MD  Cardiologist:  Dorris Carnes, MD   Referring MD: Larey Seat*   Chief Complaint  Patient presents with  . Follow-up    CHF    History of Present Illness:    Donna Herrera is a 72 y.o. female with systolic heart failure secondary to dilated cardiomyopathy with EF 35-40%, tobacco abuse, hypertension, hyperlipidemia.  Cardiac catheterization in July 2017 demonstrated no CAD.  She was last seen in clinic 02/26/18 after being lost to follow up since 04/2016.  She was volume overloaded and I adjusted her diuresis.  A follow-up echocardiogram demonstrated worsening LV function with an EF of 15-20% and moderate diastolic dysfunction.  PASP was elevated at 62.  I stopped her ARB and placed her on Entresto.  Her Lasix was cut back recently due to significant diuresis.  Donna Herrera returns for follow up.  She is here with her daughter.  She has been doing well since her medications were adjusted.  She is breathing much better.  She denies orthopnea, PND, edema. She denies chest pain, dizziness.    Prior CV studies:   The following studies were reviewed today:  Echo 03/06/2018 EF 15-20, diffuse HK, grade 2 diastolic dysfunction, trivial AI, MAC, trivial MR, moderate LAE, normal RVSF, mild TR, PASP 62  LHC 05/11/16 1. Normal coronary anatomy 2. Severe LV dysfunction with EF 30-35%. 3. Normal right heart and LV filling pressures.  Echo 10/05/15 Mild LVH, EF 35-40%, diffuse HK, grade 2 diastolic dysfunction, MAC, mild MR, moderate LAE, normal RVSF, PASP 49 mmHg  Echo 01/16/15 EF 40%, diffuse HK, mild MR, moderate LAE, mild RAE, PASP 42 mmHg  Past Medical History:  Diagnosis Date  . Allergy   . Arthritis   . Chronic combined systolic and diastolic CHF (congestive heart failure) ([place]) 10/21/2015   A. Echo 3/16: EF 40%,  diffuse HK, mild MR, moderate LAE, mild RAE, PASP 42 mmHg  //  B. Echo 12/16: Mild LVH, EF 35-40%, diffuse HK, grade 2 diastolic dysfunction, MAC, mild MR, moderate LAE, normal RVSF, PASP 49 mmHg // Echo 5/19: EF 15-20, diffuse HK, grade 2 diastolic dysfunction, trivial AI, MAC, trivial MR, moderate LAE, normal RVSF, mild TR, PASP 62   . Chronic pain   . GERD (gastroesophageal reflux disease)   . Gout   . H/O slipped capital femoral epiphysis (SCFE)   . Hyperlipidemia   . Hypertension   . Hypertensive heart disease with CHF (congestive heart failure) ([place]) 10/12/2012  . NICM (nonischemic cardiomyopathy) ([place]) 06/09/2016   A. LHC 7/17: Normal coronary arteries, EF 30-35%, LVEDP 14 mmHg   Surgical Hx: The patient  has a past surgical history that includes Cesarean section; Tubal ligation; Total hip arthroplasty (1987); Joint replacement (1991, 1997); Hip fusion; Rotator cuff repair; Cardiac catheterization (2005); and Cardiac catheterization (N/A, 05/11/2016).   Current Medications: Current Meds  Medication Sig  . albuterol (PROVENTIL HFA;VENTOLIN HFA) 108 (90 Base) MCG/ACT inhaler Inhale 1-2 puffs into the lungs every 6 (six) hours as needed for wheezing.  [name] aspirin 81 MG tablet Take 1 tablet (81 mg total) by mouth daily.  [name] atorvastatin (LIPITOR) 10 MG tablet TAKE 1 TABLET BY MOUTH EVERY DAY  . carvedilol (COREG) 6.25 MG tablet Take 1 tablet (6.25 mg total) by mouth 2 (two) times daily.  . cetirizine (ZYRTEC) 10 MG tablet TAKE 1 TABLET (  10 MG TOTAL) BY MOUTH DAILY.  [name] colchicine 0.6 MG tablet TAKE 1 TABLET (0.6 MG TOTAL) BY MOUTH DAILY AS NEEDED FOR GOUT FLARE  . cyclobenzaprine (FLEXERIL) 10 MG tablet Take 1 tablet (10 mg total) by mouth 2 (two) times daily as needed for muscle spasms.  [name] FLUoxetine (PROZAC) 20 MG tablet Take 1 tablet (20 mg total) by mouth daily.  . furosemide (LASIX) 20 MG tablet Take 1 tablet (20 mg total) by mouth daily.  [name] HYDROcodone-acetaminophen (NORCO/VICODIN) 5-325  MG tablet Take 1 tablet by mouth 2 (two) times daily as needed for moderate pain.  . meloxicam (MOBIC) 15 MG tablet TAKE 1 TABLET (15 MG TOTAL) BY MOUTH DAILY AS NEEDED FOR PAIN.  [name] pantoprazole (PROTONIX) 20 MG tablet TAKE 2 TABLETS (40 MG TOTAL) BY MOUTH DAILY.  [name] potassium chloride SA (K-DUR,KLOR-CON) 20 MEQ tablet Take 1 tablet (20 mEq total) by mouth daily.  . sacubitril-valsartan (ENTRESTO) 24-26 MG Take 1 tablet by mouth 2 (two) times daily.  [name] spironolactone (ALDACTONE) 25 MG tablet Take 1 tablet (25 mg total) by mouth daily.  [name] triamcinolone ointment (KENALOG) 0.5 % Apply 1 application topically 2 (two) times daily. To palms  . Vitamin D, Ergocalciferol, (DRISDOL) 50000 units CAPS capsule Take 1 capsule (50,000 Units total) by mouth every 7 (seven) days. After 8 weeks, take over-the-counter vitamin D 2000 units daily.     Allergies:   Crab [shellfish allergy] and Morphine and related   Social History   Tobacco Use  . Smoking status: Former Smoker    Packs/day: 0.50  . Smokeless tobacco: Current User  . Tobacco comment: decreased smoking/1 pack every 3 days  Substance Use Topics  . Alcohol use: Yes    Comment: on holidays  . Drug use: No     Family Hx: The patient's family history includes Cancer in her father; Depression in her mother; Diabetes in her mother and sister; Heart disease in her mother; Hypertension in her mother; Stroke in her mother. There is no history of Breast cancer.  ROS:   Please see the history of present illness.    ROS All other systems reviewed and are negative.   EKGs/Labs/Other Test Reviewed:    EKG:  EKG is  ordered today.  The ekg ordered today demonstrates normal sinus rhythm, heart rate 68, left axis deviation, PVC, QTC 463, similar to prior tracing  Recent Labs: 03/26/2017: B Natriuretic Peptide 792.1 02/06/2018: ALT 13 02/25/2018: Hemoglobin 12.2; Platelets 184 03/06/2018: BUN 13; Creatinine, Ser 0.87; NT-Pro BNP 3,067; Potassium 3.7; Sodium  142   Recent Lipid Panel Lab Results  Component Value Date/Time   CHOL 177 10/04/2015 01:41 AM   TRIG 78 10/04/2015 01:41 AM   HDL 50 10/04/2015 01:41 AM   CHOLHDL 3.5 10/04/2015 01:41 AM   LDLCALC 111 (H) 10/04/2015 01:41 AM    Physical Exam:    VS:  BP 120/64   Pulse 70   Ht 5' 6"  (1.676 m)   Wt 169 lb 8 oz (76.9 kg)   SpO2 98%   BMI 27.36 kg/m     Wt Readings from Last 3 Encounters:  03/20/18 169 lb 8 oz (76.9 kg)  02/26/18 183 lb (83 kg)  02/25/18 183 lb (83 kg)     Physical Exam  Constitutional: She is oriented to person, place, and time. She appears well-developed and well-nourished. No distress.  HENT:  Head: Normocephalic and atraumatic.  Neck: Neck supple. No JVD present.  Cardiovascular: Normal rate,  regular rhythm, S1 normal, S2 normal and normal heart sounds.  No murmur heard. Pulmonary/Chest: Effort normal. She has no rales.  Abdominal: Soft. There is no hepatomegaly.  Musculoskeletal: She exhibits no edema.  Neurological: She is alert and oriented to person, place, and time.  Skin: Skin is warm and dry.    ASSESSMENT & PLAN:    Chronic combined systolic and diastolic CHF (congestive heart failure) (HCC) EF is lower at 15-20 by recent echocardiogram.  Her volume status is much improved.  She is NYHA 2.  I suspect her ejection fraction may have decreased while she was off of some of her medications recently.  She had a cardiac catheterization 2 years ago with normal coronary arteries.  She denies any symptoms of angina.  At this point, I do not believe that she needs an ischemic evaluation.  I will review this further with Dr. Harrington Challenger.  -Continue Entresto, carvedilol, spironolactone  -BMET today  -Repeat limited echo to recheck EF in the PASP in 3 months  -Entresto financial assistance   Hypertensive heart disease with congestive heart failure ([place]) Blood pressure is much improved.  I am not sure that her BP will tolerate further increases in her medical  regimen.  Continue current therapy.  Pulmonary hypertension This is likely secondary to volume overload.  Her volume has significantly improved since the echocardiogram was performed.  As noted, a repeat echo will be obtained in 3 months.  If her pulmonary hypertension remains significant, consider referral to the advanced heart failure clinic for further evaluation.  Hyperlipidemia Continue atorvastatin.  If cost is an issue, we can certainly change her to something like pravastatin.   Dispo:  Return in about 3 months (around 06/20/2018) for Routine Follow Up, w/ [name], PA-C.   Medication Adjustments/Labs and Tests Ordered: Current medicines are reviewed at length with the patient today.  Concerns regarding medicines are outlined above.  Tests Ordered: Orders Placed This Encounter  Procedures  . Basic metabolic panel  . EKG 12-Lead  . ECHOCARDIOGRAM LIMITED   Medication Changes: No orders of the defined types were placed in this encounter.   Signed, [name], PA-C  03/20/2018 1:18 PM    [facility] [place], [place], [place]  [address] Phone: [phone]; Fax: [phone]

## 2018-03-20 NOTE — Patient Instructions (Addendum)
Medication Instructions:  1. Your physician recommends that you continue on your current medications as directed. Please refer to the Current Medication list given to you today.  2. YOU HAVE BEEN GIVEN ENTRESTO ASSISTANCE PROGRAM FORMS. FILL OUT AND RETURN ASAP.  3. YOU HAVE BEEN GIVEN SAMPLES OF ENTRESTO.   Labwork: Today: BMET  Testing/Procedures: Your physician has requested that you have an limited echocardiogram. Echocardiography is a painless test that uses sound waves to create images of your heart. It provides your doctor with information about the size and shape of your heart and how well your heart's chambers and valves are working. This procedure takes approximately one hour. There are no restrictions for this procedure. THIS NEEDS TO BE DONE 1 WEEK BEFORE APPOINTMENT WITH SCOTT WEAVER, PAC IN 3 MONTHS    Follow-Up: Follow up with [name], PAC in 3 months.   Any Other Special Instructions Will Be Listed Below (If Applicable).     If you need a refill on your cardiac medications before your next appointment, please call your pharmacy.

## 2018-03-21 ENCOUNTER — Telehealth: Payer: Self-pay

## 2018-03-21 ENCOUNTER — Telehealth: Payer: Self-pay | Admitting: *Deleted

## 2018-03-21 LAB — BASIC METABOLIC PANEL
BUN/Creatinine Ratio: 19 (ref 12–28)
BUN: 17 mg/dL (ref 8–27)
CHLORIDE: 102 mmol/L (ref 96–106)
CO2: 19 mmol/L — ABNORMAL LOW (ref 20–29)
Calcium: 9.9 mg/dL (ref 8.7–10.3)
Creatinine, Ser: 0.88 mg/dL (ref 0.57–1.00)
GFR calc non Af Amer: 66 mL/min/{1.73_m2} (ref >59)
GFR, EST AFRICAN AMERICAN: 76 mL/min/{1.73_m2} (ref >59)
GLUCOSE: 104 mg/dL — AB (ref 65–99)
Potassium: 5 mmol/L (ref 3.5–5.2)
SODIUM: 142 mmol/L (ref 134–144)

## 2018-03-21 NOTE — Telephone Encounter (Signed)
-----   Message from Liliane Shi, [place] sent at 03/21/2018  1:39 PM EDT ----- Renal function, potassium normal. PLAN:  Continue current medications and follow up as planned.  [name], PA-C    03/21/2018 1:38 PM

## 2018-03-21 NOTE — Telephone Encounter (Signed)
Pt has been notified of lab results by phone with verbal understanding. Pt thanked me for the call.   

## 2018-03-21 NOTE — Telephone Encounter (Signed)
The pt has completed a [employer] pt assistance application. I completed the provider part, [name], PA-c has signed it and I have faxed it to [facility].

## 2018-03-28 NOTE — Telephone Encounter (Addendum)
**Note De-Identified  Obfuscation** We received a Final Coverage Notification from [facility] stating: A benefit investigation has been conducted for your pt and coverage has been confirmed without a PA authorization requirement. Approval good until the end of this year and then [employer] will re-evaluate the pts eligibility for the NPAF.  The letter states that they will be contacting the pt regarding her enrollment and eligibility.  Letter has been sent to be scanned into the pts chart.

## 2018-03-30 ENCOUNTER — Ambulatory Visit (INDEPENDENT_AMBULATORY_CARE_PROVIDER_SITE_OTHER): Payer: Medicare Other | Admitting: Internal Medicine

## 2018-03-30 ENCOUNTER — Encounter: Payer: Self-pay | Admitting: Internal Medicine

## 2018-03-30 ENCOUNTER — Other Ambulatory Visit: Payer: Self-pay

## 2018-03-30 VITALS — BP 98/61 | HR 67 | Temp 98.1°F | Wt 169.0 lb

## 2018-03-30 DIAGNOSIS — M25531 Pain in right wrist: Secondary | ICD-10-CM | POA: Diagnosis not present

## 2018-03-30 DIAGNOSIS — I5042 Chronic combined systolic (congestive) and diastolic (congestive) heart failure: Secondary | ICD-10-CM | POA: Diagnosis not present

## 2018-03-30 DIAGNOSIS — M25551 Pain in right hip: Secondary | ICD-10-CM | POA: Diagnosis not present

## 2018-03-30 DIAGNOSIS — M25532 Pain in left wrist: Secondary | ICD-10-CM

## 2018-03-30 MED ORDER — COLCHICINE 0.6 MG PO TABS: ORAL_TABLET | ORAL | 2 refills | Status: DC

## 2018-03-30 MED ORDER — DICLOFENAC SODIUM 1 % TD GEL: 2.0000 g | Freq: Four times a day (QID) | TRANSDERMAL | 1 refills | Status: DC

## 2018-03-30 MED ORDER — HYDROCODONE-ACETAMINOPHEN 5-325 MG PO TABS: 1.0000 | ORAL_TABLET | Freq: Two times a day (BID) | ORAL | 0 refills | Status: DC | PRN

## 2018-03-30 NOTE — Progress Notes (Signed)
[facility] Progress Note  Subjective:  Donna Herrera is a 72 y.o. female with worsening HFrEF (EF 15-20% 02/2018, previous EF 30-35% in 2017), possible COPD, HLD, cervical spondylosis and right hip replacement with fractured wire who presents for follow-up of changes in HF management plan, hip pain, and wrist pain.   #HF: - Patient wanted to make sure PCP was updated about changes in her HF management - She had worsening EF on recent ECHO in setting of not taking all of her medication - Her lasix was increased to 40 mg daily; she stopped losartan and started entresto; had normal electrolytes on BMET 5/21 - To have repeat ECHO this summer to assess for improvement in EF after being on proper medical management ROS: No dyspnea; has some intermittent muscle cramps  #Hip pain: - Chronically uses crutches to get around  - Managing with hydrocodone-acetaminophen 5-325 usually once daily but sometimes more often and sometimes not at all - takes ibuprofen prn - not interested in surgical repair of hip; may be interested in PT as part of a cardiac rehab program if suggested by her Cardiologist at follow-up ROS: No falls  #Wrist pain: - Reports left wrist has been sore and "acting up" about 1 month. Right hurts a little, as well.  - Takes colchicine for gout prn but says this is expensive. She reports taking allopurinol in the past but that she thinks it seemed to make symptoms worse - Has not been taking any NSAIDs regularly for this - Usual gout flares are in her feet  HM: Brought insurance health visit information which indicated they had left a colon cancer screening kit for her to have done at home.  Allergies  Allergen Reactions  . Crab [Shellfish Allergy]     Itching to lips  . Morphine And Related Itching and Swelling    Social History   Tobacco Use  . Smoking status: Former Smoker    Packs/day: 0.50  . Smokeless tobacco: Current User  . Tobacco  comment: decreased smoking/1 pack every 3 days  Substance Use Topics  . Alcohol use: Yes    Comment: on holidays    Objective: Blood pressure 98/61, pulse 67, temperature 98.1 F (36.7 C), temperature source Oral, weight 169 lb (76.7 kg), SpO2 99 %. Body mass index is 27.28 kg/m. Constitutional: Overweight female, in NAD HEENT: 3 shallow ulcers on left side of tongue Cardiovascular: RRR, S1, S2, no m/r/g.  Pulmonary/Chest: Effort normal and breath sounds normal.  Musculoskeletal: Mild TTP over CMC joints of thumbs bilaterally and over wrist. FROM with wrist flexion and extension bilaterally.  Neurological: Grip strength 5/5 bilaterally Skin: Skin is warm and dry. No rash noted.  Psychiatric: Normal mood and affect.  Vitals reviewed  Assessment/Plan: Chronic combined systolic and diastolic CHF (congestive heart failure) (HCC) - Worsened EF. Patient's weight stable. Lasix increased by HF team and entresto added.  - To have follow-up ECHO in a few months  Right hip pain - Chronic, stable on current pain regimen. Not interested in surgery because afraid of bad result and has already had multiple surgeries. May consider PT in setting of cardiac rehab.  Indication for chronic opioid:debilitating joint pain Medication and dose:hydrocodone-acetaminophen 5-325 mg # pills per month:45 (wrote for 2 months) Last UDS date:07/2017 Pain contract signed (Y/N):April 2014 Date narcotic database last reviewed (include red flags): 03/29/2018; no red flags  Pain in both wrists - No known injury. May be arthritis given [facility] joint involvement. Also could  be gout due to recent increase in lasix. - Patient declined getting labs today. Suggested getting uric acid level to decide whether to start allopurinol. To return for labs if no improvement in pain after trying voltaren gel. To try 600 mg ibuprofen BID if no improvement with topical NSAID.  - ordered voltaren gel  Follow-up if no improvement in  wrist pain.   Olene Floss, MD [place], PGY-3

## 2018-03-30 NOTE — Patient Instructions (Signed)
Ms. Hasten,  Please try voltaren gel on your wrists and thumb joints up to four times a day. If no improvement, try ibuprofen 600 mg twice daily with food for a week (that would be three 200 mg tablets). If no improvement in pain, please return for labs to check uric acid levels to make sure your gout is being well enough controlled. If you have trouble getting the voltaren gel, try aspercreme instead.  I have refilled your pain medication for another 2 months.  Try oragel if the canker sores in your mouth bother you.  Best, Dr. Ola Spurr

## 2018-04-01 ENCOUNTER — Encounter: Payer: Self-pay | Admitting: Internal Medicine

## 2018-04-01 DIAGNOSIS — M25532 Pain in left wrist: Secondary | ICD-10-CM

## 2018-04-01 DIAGNOSIS — M25531 Pain in right wrist: Secondary | ICD-10-CM | POA: Insufficient documentation

## 2018-04-01 NOTE — Assessment & Plan Note (Signed)
-   No known injury. May be arthritis given [facility] joint involvement. Also could be gout due to recent increase in lasix. - Patient declined getting labs today. Suggested getting uric acid level to decide whether to start allopurinol. To return for labs if no improvement in pain after trying voltaren gel. To try 600 mg ibuprofen BID if no improvement with topical NSAID.  - ordered voltaren gel

## 2018-04-01 NOTE — Assessment & Plan Note (Signed)
-   Worsened EF. Patient's weight stable. Lasix increased by HF team and entresto added.  - To have follow-up ECHO in a few months

## 2018-04-01 NOTE — Assessment & Plan Note (Addendum)
-   Chronic, stable on current pain regimen. Not interested in surgery because afraid of bad result and has already had multiple surgeries. May consider PT in setting of cardiac rehab.  Indication for chronic opioid:debilitating joint pain Medication and dose:hydrocodone-acetaminophen 5-325 mg # pills per month:45 (wrote for 2 months) Last UDS date:07/2017 Pain contract signed (Y/N):April 2014 Date narcotic database last reviewed (include red flags): 03/29/2018; no red flags

## 2018-04-03 ENCOUNTER — Telehealth: Payer: Self-pay | Admitting: *Deleted

## 2018-04-03 NOTE — Telephone Encounter (Signed)
Pt lm on nurse line. She is requesting an abx called in for her infected tooth.  States that she made the provider aware of tooth at last visit. Zaraya Delauder, Salome Spotted, CMA

## 2018-04-04 NOTE — Telephone Encounter (Signed)
Patient will need to come in for evaluation and antibiotic script as her provider is not here this week. Thanks Aseneth Hack

## 2018-04-05 ENCOUNTER — Telehealth: Payer: Self-pay

## 2018-04-05 NOTE — Telephone Encounter (Signed)
Attempted to call patient to schedule an appointment concerning an antibiotic for her infected tooth. There was no answer and no voice mail.  I will try to call patient at another time.  If patient happens to call please schedule her an appointment with Dr. Ola Spurr. Ozella Almond, [place]

## 2018-04-10 NOTE — Telephone Encounter (Signed)
Attempted to call pt again, no answer or voicemail set up. Deseree Kennon Holter, CMA

## 2018-04-13 ENCOUNTER — Telehealth: Payer: Self-pay

## 2018-04-13 NOTE — Telephone Encounter (Signed)
Apt scheduled for 6/17 with pcp for infected gums.

## 2018-04-13 NOTE — Telephone Encounter (Signed)
Error

## 2018-04-16 ENCOUNTER — Ambulatory Visit (INDEPENDENT_AMBULATORY_CARE_PROVIDER_SITE_OTHER): Payer: Medicare Other | Admitting: Internal Medicine

## 2018-04-16 VITALS — BP 125/82 | HR 88 | Temp 98.7°F | Wt 168.4 lb

## 2018-04-16 DIAGNOSIS — K068 Other specified disorders of gingiva and edentulous alveolar ridge: Secondary | ICD-10-CM

## 2018-04-16 MED ORDER — CHLORHEXIDINE GLUCONATE 0.12% ORAL RINSE (MEDLINE KIT): OROMUCOSAL | 2 refills | Status: DC

## 2018-04-16 MED ORDER — HYDROCODONE-ACETAMINOPHEN 5-325 MG PO TABS: 1.0000 | ORAL_TABLET | Freq: Two times a day (BID) | ORAL | 0 refills | Status: DC | PRN

## 2018-04-16 MED ORDER — AMOXICILLIN-POT CLAVULANATE 875-125 MG PO TABS: 1.0000 | ORAL_TABLET | Freq: Two times a day (BID) | ORAL | 0 refills | Status: DC

## 2018-04-16 NOTE — Patient Instructions (Signed)
Ms. Mcdonnell,  Please take augmentin twice daily for the next 2 weeks for gingivitis. You can also use chlohexidine mouthwash. Try listerine if too expensive.  It is very important to get in with a dentist as soon as possible.  Best, Dr. Ola Spurr

## 2018-04-18 DIAGNOSIS — K068 Other specified disorders of gingiva and edentulous alveolar ridge: Secondary | ICD-10-CM

## 2018-04-18 HISTORY — DX: Other specified disorders of gingiva and edentulous alveolar ridge: K06.8

## 2018-04-18 NOTE — Progress Notes (Signed)
[facility] Progress Note  Subjective:  Donna Herrera is a 72 y.o. female withworsening HFrEF (EF 15-20% 02/2018, previousEF 30-35% in 2017), possible COPD,HLD, cervicalspondylosis and right hip replacement with fractured wire who presents for gum pain. She is accompanied by her daughter. This has been ongoing for years near site of broken tooth of right lower jaw but now seems to be painful over all teeth for the last few months. Occasionally notes bleeding with brushing her teeth; does not floss. Has noted change in taste sensation over last year and food often tastes extra salty. Quit smoking over a year ago. Has not seen a dentist anytime recently--is concerned about cost and is not sure who to go to. Pain makes it harder to eat. ROS: No fevers, no rash  Allergies  Allergen Reactions  . Crab [Shellfish Allergy]     Itching to lips  . Morphine And Related Itching and Swelling    Social History   Tobacco Use  . Smoking status: Former Smoker    Packs/day: 0.50  . Smokeless tobacco: Current User  . Tobacco comment: decreased smoking/1 pack every 3 days  Substance Use Topics  . Alcohol use: Yes    Comment: on holidays    Objective: Blood pressure 125/82, pulse 88, temperature 98.7 F (37.1 C), temperature source Oral, weight 168 lb 6.4 oz (76.4 kg), SpO2 97 %. Body mass index is 27.18 kg/m. Constitutional: Older female in NAD, resting against exam table HENT: No cervical lymphadenopathy, poor dentition without upper teeth with receding gumline of lower teeth; gums TTP throughout; tongue appears normal Musculoskeletal: No LE edema Neurological: AOx3, no focal deficits. Skin: Dry skin. Psychiatric: Normal mood and affect.  Vitals reviewed  Has lost over 10 lbs since end of April but thought to be in setting of improved CHF control with adherence to medications.   Negative FIT test 01/2018 and has declined colonoscopy and lung cancer screening with low  dose CT in past.   Assessment/Plan: Pain in gums - Suspect gingivitis given receding gumline and pain. With history of using chewing and smoking tobacco, also consider oral cancer though no masses or locally irritated area to suggest need for biopsy.  - Provided list of dentists who may offer more affordable care in the area. Recommended patient call around to see where may have shortest waitlist. - Provided prescription for augmentin given worsening of gum pain. Also suggested chlorhexidine mouthwash.   Follow-up later this summer/early fall for chronic pain--has tramadol through August (#45 lasts over a month).  Olene Floss, MD [place], PGY-3

## 2018-04-19 ENCOUNTER — Encounter: Payer: Self-pay | Admitting: Internal Medicine

## 2018-04-19 NOTE — Assessment & Plan Note (Signed)
-   Suspect gingivitis given receding gumline and pain. With history of using chewing and smoking tobacco, also consider oral cancer though no masses or locally irritated area to suggest need for biopsy.  - Provided list of dentists who may offer more affordable care in the area. Recommended patient call around to see where may have shortest waitlist. - Provided prescription for augmentin given worsening of gum pain. Also suggested chlorhexidine mouthwash.

## 2018-05-24 ENCOUNTER — Other Ambulatory Visit: Payer: Self-pay | Admitting: Physician Assistant

## 2018-06-06 ENCOUNTER — Ambulatory Visit (HOSPITAL_COMMUNITY): Payer: Medicare Other | Attending: Cardiology

## 2018-06-06 ENCOUNTER — Other Ambulatory Visit: Payer: Self-pay

## 2018-06-06 DIAGNOSIS — I5042 Chronic combined systolic (congestive) and diastolic (congestive) heart failure: Secondary | ICD-10-CM

## 2018-06-06 DIAGNOSIS — I110 Hypertensive heart disease with heart failure: Secondary | ICD-10-CM | POA: Diagnosis not present

## 2018-06-06 MED ORDER — PERFLUTREN LIPID MICROSPHERE
1.0000 mL | INTRAVENOUS | Status: AC | PRN
Start: 2018-06-06 — End: 2018-06-06
  Administered 2018-06-06: 2 mL via INTRAVENOUS

## 2018-06-07 ENCOUNTER — Encounter: Payer: Self-pay | Admitting: Physician Assistant

## 2018-06-13 ENCOUNTER — Ambulatory Visit: Payer: Medicare Other | Admitting: Physician Assistant

## 2018-06-25 ENCOUNTER — Other Ambulatory Visit: Payer: Self-pay | Admitting: Internal Medicine

## 2018-07-13 ENCOUNTER — Other Ambulatory Visit: Payer: Self-pay | Admitting: *Deleted

## 2018-07-13 NOTE — Telephone Encounter (Signed)
Pt made appt to meet new MD (08/14/18) she would like a refill until then. [place], Salome Spotted, CMA

## 2018-07-15 MED ORDER — HYDROCODONE-ACETAMINOPHEN 5-325 MG PO TABS: 1.0000 | ORAL_TABLET | Freq: Two times a day (BID) | ORAL | 0 refills | Status: DC | PRN

## 2018-07-18 ENCOUNTER — Ambulatory Visit (INDEPENDENT_AMBULATORY_CARE_PROVIDER_SITE_OTHER): Payer: Medicare Other | Admitting: Physician Assistant

## 2018-07-18 ENCOUNTER — Telehealth: Payer: Self-pay | Admitting: *Deleted

## 2018-07-18 ENCOUNTER — Encounter: Payer: Self-pay | Admitting: Physician Assistant

## 2018-07-18 VITALS — BP 122/72 | HR 61 | Ht 66.0 in | Wt 180.8 lb

## 2018-07-18 DIAGNOSIS — I5042 Chronic combined systolic (congestive) and diastolic (congestive) heart failure: Secondary | ICD-10-CM

## 2018-07-18 DIAGNOSIS — I110 Hypertensive heart disease with heart failure: Secondary | ICD-10-CM

## 2018-07-18 DIAGNOSIS — E785 Hyperlipidemia, unspecified: Secondary | ICD-10-CM

## 2018-07-18 LAB — BASIC METABOLIC PANEL
BUN/Creatinine Ratio: 20 (ref 12–28)
BUN: 21 mg/dL (ref 8–27)
CALCIUM: 9.2 mg/dL (ref 8.7–10.3)
CO2: 19 mmol/L — ABNORMAL LOW (ref 20–29)
Chloride: 107 mmol/L — ABNORMAL HIGH (ref 96–106)
Creatinine, Ser: 1.07 mg/dL — ABNORMAL HIGH (ref 0.57–1.00)
GFR calc Af Amer: 60 mL/min/{1.73_m2} (ref >59)
GFR calc non Af Amer: 52 mL/min/{1.73_m2} — ABNORMAL LOW (ref >59)
Glucose: 102 mg/dL — ABNORMAL HIGH (ref 65–99)
Potassium: 4.8 mmol/L (ref 3.5–5.2)
Sodium: 143 mmol/L (ref 134–144)

## 2018-07-18 LAB — PRO B NATRIURETIC PEPTIDE: NT-Pro BNP: 233 pg/mL (ref 0–301)

## 2018-07-18 NOTE — Progress Notes (Signed)
Cardiology Office Note:    Date:  07/18/2018   ID:  Donna Herrera, DOB Sep 18, 1946, MRN [id number]  PCP:  [place] Bing, DO  Cardiologist:  Dorris Carnes, MD   Electrophysiologist:  None   Referring MD: Larey Seat*   Chief Complaint  Patient presents with  . Follow-up    CHF     History of Present Illness:    Donna Herrera is a 72 y.o. female with systolic heart failure secondary to dilated cardiomyopathy with EF 35-40%, tobacco abuse, hypertension, hyperlipidemia. Cardiac catheterization in July 2017 demonstrated no CAD.  Echocardiogram in May 2019 demonstrated worsening LV function with an EF of 15-20%.  Her heart failure medications were adjusted.  She was last seen 03/20/2018.  A follow-up echocardiogram demonstrated improved LV function with an EF of 40-45%.  [name] returns for follow-up of her congestive heart failure.  She is here with her daughter.  Since last seen, she has felt well.  She has not had any significant change in shortness of breath.  She has rare episodes of chest pain.  She has not passed out, had paroxysmal nocturnal dyspnea or significant swelling.  Her weight has increased 12 lbs since June 2019.    Prior CV studies:   The following studies were reviewed today:  Echo 06/06/18 Mod LVH, EF 40-45, diff HK, Gr 1 DD, MAC, trivial MR, mild LAE, trivial TR  Echo 03/06/2018 EF 15-20, diffuse HK, grade 2 diastolic dysfunction, trivial AI, MAC, trivial MR, moderate LAE, normal RVSF, mild TR, PASP 62  LHC 05/11/16 1. Normal coronary anatomy 2. Severe LV dysfunction with EF 30-35%. 3. Normal right heart and LV filling pressures.  Echo 10/05/15 Mild LVH, EF 35-40%, diffuse HK, grade 2 diastolic dysfunction, MAC, mild MR, moderate LAE, normal RVSF, PASP 49 mmHg  Echo 01/16/15 EF 40%, diffuse HK, mild MR, moderate LAE, mild RAE, PASP 42 mmHg  Past Medical History:  Diagnosis Date  . Allergy   . Arthritis   .  Candidiasis, mouth 10/19/2017  . Chronic combined systolic and diastolic CHF (congestive heart failure) ([place]) 10/21/2015   Echo 3/16: EF 40%, diffuse HK, mild MR, moderate LAE, mild RAE, PASP 42 mmHg  //  Echo 12/16: Mild LVH, EF 35-40%, diffuse HK, Gr 2 DD, MAC, mild MR, mod LAE, normal RVSF, PASP 49 mmHg // Echo 5/19: EF 15-20, diff HK, Gr 2 DD, trivial AI, MAC, trivial MR, mod LAE, normal RVSF, mild TR, PASP 62 // Limited Echo 8/19: mod LVH, EF 40-45, diff HK, Gr 1 DD, MAC, mild LAE   . Chronic left shoulder pain 04/10/2013  . Chronic lung disease   . Chronic pain   . Chronic pain syndrome 07/20/2017  . Depression 08/25/2016  . DJD (degenerative joint disease) 10/12/2012   Multiple joint replacements by Dr. Collier Salina.   . Dyshidrotic eczema 12/31/2014  . Essential hypertension 07/20/2017  . GERD (gastroesophageal reflux disease)   . Gout   . H/O slipped capital femoral epiphysis (SCFE)   . Hyperlipidemia   . Hypertension   . Hypertensive heart disease with CHF (congestive heart failure) ([place]) 10/12/2012  . Loss of taste 02/06/2018  . Mass of breast, left 07/21/2014  . NICM (nonischemic cardiomyopathy) ([place]) 06/09/2016   A. LHC 7/17: Normal coronary arteries, EF 30-35%, LVEDP 14 mmHg  . Pain in gums 04/18/2018  . Right hip pain 11/02/2012  . Tendonitis, Achilles, left 05/24/2017  . Tobacco abuse 09/29/2014  . Vitamin D deficiency 12/31/2014  Surgical Hx: The patient  has a past surgical history that includes Cesarean section; Tubal ligation; Total hip arthroplasty (1987); Joint replacement (1991, 1997); Hip fusion; Rotator cuff repair; Cardiac catheterization (2005); and Cardiac catheterization (N/A, 05/11/2016).   Current Medications: Current Meds  Medication Sig  . albuterol (PROVENTIL HFA;VENTOLIN HFA) 108 (90 Base) MCG/ACT inhaler Inhale 1-2 puffs into the lungs every 6 (six) hours as needed for wheezing.  [name] aspirin 81 MG tablet Take 1 tablet (81 mg total) by mouth daily.  [name] atorvastatin  (LIPITOR) 10 MG tablet TAKE 1 TABLET BY MOUTH EVERY DAY  . carvedilol (COREG) 6.25 MG tablet Take 1 tablet (6.25 mg total) by mouth 2 (two) times daily.  . cetirizine (ZYRTEC) 10 MG tablet TAKE 1 TABLET (10 MG TOTAL) BY MOUTH DAILY.  [name] colchicine 0.6 MG tablet TAKE 1 TABLET (0.6 MG TOTAL) BY MOUTH DAILY AS NEEDED FOR GOUT FLARE  . cyclobenzaprine (FLEXERIL) 10 MG tablet Take 1 tablet (10 mg total) by mouth 2 (two) times daily as needed for muscle spasms.  . diclofenac sodium (VOLTAREN) 1 % GEL Apply 2 g topically 4 (four) times daily.  [name] FLUoxetine (PROZAC) 20 MG tablet Take 1 tablet (20 mg total) by mouth daily.  [name] HYDROcodone-acetaminophen (NORCO/VICODIN) 5-325 MG tablet Take 1 tablet by mouth 2 (two) times daily as needed for moderate pain.  [name] KLOR-CON M20 20 MEQ tablet TAKE 1 TABLET BY MOUTH EVERY DAY  . meloxicam (MOBIC) 15 MG tablet TAKE 1 TABLET (15 MG TOTAL) BY MOUTH DAILY AS NEEDED FOR PAIN.  [name] pantoprazole (PROTONIX) 20 MG tablet TAKE 2 TABLETS BY MOUTH EVERY DAY  . sacubitril-valsartan (ENTRESTO) 24-26 MG Take 1 tablet by mouth 2 (two) times daily.  [name] spironolactone (ALDACTONE) 25 MG tablet Take 1 tablet (25 mg total) by mouth daily.  [name] triamcinolone ointment (KENALOG) 0.5 % Apply 1 application topically 2 (two) times daily. To palms  . Vitamin D, Ergocalciferol, (DRISDOL) 50000 units CAPS capsule Take 1 capsule (50,000 Units total) by mouth every 7 (seven) days. After 8 weeks, take over-the-counter vitamin D 2000 units daily.     Allergies:   Crab [shellfish allergy] and Morphine and related   Social History   Tobacco Use  . Smoking status: Former Smoker    Packs/day: 0.50  . Smokeless tobacco: Current User  . Tobacco comment: decreased smoking/1 pack every 3 days  Substance Use Topics  . Alcohol use: Yes    Comment: on holidays  . Drug use: No     Family Hx: The patient's family history includes Cancer in her father; Depression in her mother; Diabetes in her mother and  sister; Heart disease in her mother; Hypertension in her mother; Stroke in her mother. There is no history of Breast cancer.  ROS:   Please see the history of present illness.    Review of Systems  Constitution: Positive for chills.  Respiratory: Positive for cough.   Musculoskeletal: Positive for myalgias.  Neurological: Positive for dizziness and headaches.   All other systems reviewed and are negative.   EKGs/Labs/Other Test Reviewed:    EKG:  EKG is  ordered today.  The ekg ordered today demonstrates normal sinus rhythm, heart rate 61, low voltage, nonspecific ST-T wave changes, QTC 446  Recent Labs: 02/06/2018: ALT 13 02/25/2018: Hemoglobin 12.2; Platelets 184 03/06/2018: NT-Pro BNP 3,067 03/20/2018: BUN 17; Creatinine, Ser 0.88; Potassium 5.0; Sodium 142   Recent Lipid Panel Lab Results  Component Value Date/Time   CHOL 177  10/04/2015 01:41 AM   TRIG 78 10/04/2015 01:41 AM   HDL 50 10/04/2015 01:41 AM   CHOLHDL 3.5 10/04/2015 01:41 AM   LDLCALC 111 (H) 10/04/2015 01:41 AM    Physical Exam:    VS:  BP 122/72   Pulse 61   Ht _0  (1.676 m)   Wt 180 lb 12.8 oz (82 kg)   BMI 29.18 kg/m     Wt Readings from Last 3 Encounters:  07/18/18 180 lb 12.8 oz (82 kg)  04/16/18 168 lb 6.4 oz (76.4 kg)  03/30/18 169 lb (76.7 kg)     Physical Exam  Constitutional: She is oriented to person, place, and time. She appears well-developed and well-nourished. No distress.  HENT:  Head: Normocephalic and atraumatic.  Eyes: No scleral icterus.  Neck: No JVD present. No thyromegaly present.  Cardiovascular: Normal rate and regular rhythm.  No murmur heard. Pulmonary/Chest: Effort normal. She has no rales.  Abdominal: Soft.  Musculoskeletal: She exhibits no edema.  Lymphadenopathy:    She has no cervical adenopathy.  Neurological: She is alert and oriented to person, place, and time.  Skin: Skin is warm and dry.  Psychiatric: She has a normal mood and affect.    ASSESSMENT &  PLAN:    Chronic combined systolic and diastolic CHF (congestive heart failure) (HCC)  Ef 40-45.  NYHA 2.  Her weight is up 12 lbs.  She is limited with her hip problems.  Her lung exam is normal and her neck veins are flat.  Will check a BMET and pro BNP today.  If her BNP is significant elevated, I will adjust her Lasix.    Hypertensive heart disease with chronic combined systolic and diastolic congestive heart failure ([place])  The patient's blood pressure is controlled on her current regimen.  Continue current therapy.    Hyperlipidemia, unspecified hyperlipidemia type   Continue statin therapy.   Dispo:  Return in about 6 months (around 01/16/2019) for Routine Follow Up, w/ [name], PA-C.   Medication Adjustments/Labs and Tests Ordered: Current medicines are reviewed at length with the patient today.  Concerns regarding medicines are outlined above.  Tests Ordered: Orders Placed This Encounter  Procedures  . Basic metabolic panel  . Pro b natriuretic peptide  . EKG 12-Lead   Medication Changes: No orders of the defined types were placed in this encounter.   Signed, [name], PA-C  07/18/2018 11:37 AM    [facility] [place] [address], [place], [place]  [address] Phone: [phone]; Fax: [phone]

## 2018-07-18 NOTE — Patient Instructions (Addendum)
Medication Instructions:  No changes.   YOU WILL NEED TO CALL THIS PHONE NUMBER FOR YOUR ENTRESTO REFILL: [phone]  Labwork: Today - BMET, BNP  Testing/Procedures: None   Follow-Up: [name], PA-C in 6 months.   Any Other Special Instructions Will Be Listed Below (If Applicable).  If you need a refill on your cardiac medications before your next appointment, please call your pharmacy.

## 2018-07-18 NOTE — Telephone Encounter (Signed)
Tried to reach pt to go over lab results and recommendations with med changes for lasix and K+. I will try again tomorrow to reach the pt.

## 2018-07-18 NOTE — Telephone Encounter (Signed)
-----   Message from Liliane Shi, [place] sent at 07/18/2018  5:04 PM EDT ----- The following abnormalities are noted:  Creatinine elevated. The BNP is normal.  All other values are normal, stable or within acceptable limits. Medication changes / Follow up labs / Other changes or recommendations:    - Change Lasix and potassium to every other day.  - Repeat BMET in 2 weeks.  - Weigh daily.  If weight increases > 3 lbs in 1 day, take extra lasix and potassium that day [name], PA-C 07/18/2018 5:03 PM

## 2018-07-19 MED ORDER — FUROSEMIDE 20 MG PO TABS: 20.0000 mg | ORAL_TABLET | ORAL | 3 refills | Status: DC

## 2018-07-19 MED ORDER — POTASSIUM CHLORIDE CRYS ER 20 MEQ PO TBCR: 20.0000 meq | EXTENDED_RELEASE_TABLET | ORAL | 3 refills | Status: DC

## 2018-07-19 NOTE — Telephone Encounter (Signed)
Pt has been notified of lab results and recommendations to change lasix and K+ to QOD. BMET 08/03/18. Pt is agreeable to plan of care and thanked me for the call.

## 2018-07-19 NOTE — Telephone Encounter (Signed)
-----   Message from Liliane Shi, [place] sent at 07/18/2018  5:04 PM EDT ----- The following abnormalities are noted:  Creatinine elevated. The BNP is normal.  All other values are normal, stable or within acceptable limits. Medication changes / Follow up labs / Other changes or recommendations:    - Change Lasix and potassium to every other day.  - Repeat BMET in 2 weeks.  - Weigh daily.  If weight increases > 3 lbs in 1 day, take extra lasix and potassium that day [name], PA-C 07/18/2018 5:03 PM

## 2018-08-03 ENCOUNTER — Other Ambulatory Visit: Payer: Medicare Other | Admitting: *Deleted

## 2018-08-03 DIAGNOSIS — I5042 Chronic combined systolic (congestive) and diastolic (congestive) heart failure: Secondary | ICD-10-CM | POA: Diagnosis not present

## 2018-08-03 LAB — BASIC METABOLIC PANEL
BUN / CREAT RATIO: 24 (ref 12–28)
BUN: 20 mg/dL (ref 8–27)
CO2: 20 mmol/L (ref 20–29)
CREATININE: 0.83 mg/dL (ref 0.57–1.00)
Calcium: 8.8 mg/dL (ref 8.7–10.3)
Chloride: 99 mmol/L (ref 96–106)
GFR, EST AFRICAN AMERICAN: 81 mL/min/{1.73_m2} (ref >59)
GFR, EST NON AFRICAN AMERICAN: 71 mL/min/{1.73_m2} (ref >59)
Glucose: 128 mg/dL — ABNORMAL HIGH (ref 65–99)
Potassium: 3.9 mmol/L (ref 3.5–5.2)
SODIUM: 136 mmol/L (ref 134–144)

## 2018-08-06 ENCOUNTER — Telehealth: Payer: Self-pay

## 2018-08-06 NOTE — Telephone Encounter (Signed)
-----   Message from Liliane Shi, [place] sent at 08/03/2018  5:05 PM EDT ----- Renal function and potassium are normal. Glucose is elevated.  Recommendations:  - Continue current medications and follow up as planned.  [name], PA-C    08/03/2018 5:05 PM

## 2018-08-06 NOTE — Telephone Encounter (Signed)
Notes recorded by Frederik Schmidt, RN on 08/06/2018 at 8:27 AM EDT The patient has been notified of the result and verbalized understanding. All questions (if any) were answered. Frederik Schmidt, RN 08/06/2018 8:27 AM

## 2018-08-08 ENCOUNTER — Telehealth: Payer: Self-pay | Admitting: Physician Assistant

## 2018-08-08 NOTE — Telephone Encounter (Signed)
New Message:     Pt says she needs instruction on how she is to take her Lasix and her Potassium.

## 2018-08-08 NOTE — Telephone Encounter (Signed)
I returned call to the pt. Pt had a question of how is she supposed to take her lasix and K+ after this lab work the other day. I advised pt to continue on the K+ and lasix every other day as her lab work looks better. Pt thanked me for the call back and help.

## 2018-08-13 NOTE — Progress Notes (Signed)
Subjective   Patient ID: Donna Herrera    DOB: 1946-04-14, 72 y.o. female   MRN: [id number]  CC: "Headache"  HPI: Donna Herrera is a 72 y.o. female who presents to clinic today for the following:  Headache: Patient reports 2 weeks of headache radiating from right occiput to bilateral frontal region.  This is consistent with her chronic headaches in the past.  She feels this may be related to her seasonal allergies but is not sure and she is intermittently taking the cetirizine and does report some occasional runny nose with the change in seasons.  She denies any trauma, change to her vision, motor weakness or sensory loss.  She has been taking aspirin occasionally with relief of her symptoms.  Chronic pain: Patient is on Vicodin 5-325 g and takes 1 tablet "some days."  This is her first time establishing care with me and her pain was managed by her prior PCP.  Patient reports good adherence to medication with complete resolution of pain on "all of her joints."  She reports 4 tablets left from her previous refill 1 month ago.  She typically gets 45 tablets which last her 1-1.5 months.  She uses crutches to ambulate.  ROS: see HPI for pertinent.  [facility]: HFrEF and NICM, smoker, HLD, HTN, GERD, DJD, chronic pain syndrome.  Surgical history total right hip arthroplasty, left hip fusion, C-section, cardiac cath x2, tubal, bilateral rotator cuff repair.  Surgical history DM, HTN, heart disease, CVA, depression smoking status reviewed. Medications reviewed.  Objective   BP 122/68   Pulse 62   Temp 98.3 F (36.8 C) (Oral)   Wt 182 lb (82.6 kg)   SpO2 99%   BMI 29.38 kg/m  Vitals and nursing note reviewed.  General: elderly female with crutches under both shoulders, well nourished, well developed, NAD with non-toxic appearance HEENT: normocephalic, atraumatic, moist mucous membranes, PERRLA, EOMI Neck: supple, non-tender without lymphadenopathy, able to flex chin to chest,  moderate tenderness to right trapezius Cardiovascular: regular rate and rhythm without murmurs, rubs, or gallops Lungs: clear to auscultation bilaterally with normal work of breathing Skin: warm, dry, no rashes or lesions, cap refill < 2 seconds Extremities: warm and well perfused, normal tone, no edema, 5/5 motor strength in all 4 extremities, no decrease in sensation  Assessment & Plan   Chronic pain syndrome Chronic.  Secondary to multiple palpation secondary to DJD and underlying osteoarthritis.  Well-controlled with chronic use of opioids. PWP reviewed, no red flags. - Given electronic prescription for 100-month supply of hydrocodone-acetaminophen 5-325 mg with instructions to take every 6 hours as needed - Flu shot given  Acute pain of right shoulder Acute.  Assistant with right trapezius strain.  No signs of radiculopathy.  Currently on opioids for chronic pain at multiple joints. - Given prescription for baclofen 5 mg twice daily as needed for muscle spasm along with capitation cream 4 times daily as needed - Reviewed return precautions  Orders Placed This Encounter  Procedures  . Flu Vaccine QUAD 36+ mos IM   Meds ordered this encounter  Medications  . HYDROcodone-acetaminophen (NORCO/VICODIN) 5-325 MG tablet    Sig: Take 1 tablet by mouth 2 (two) times daily as needed for moderate pain.    Dispense:  45 tablet    Refill:  0    DO NOT FILL UNTIL 08/16/2018.  [name] HYDROcodone-acetaminophen (NORCO/VICODIN) 5-325 MG tablet    Sig: Take 1 tablet by mouth every 6 (six) hours as needed for moderate pain.  Dispense:  30 tablet    Refill:  0    DO NOT FILL UNTIL 09/16/2018.  [name] HYDROcodone-acetaminophen (NORCO/VICODIN) 5-325 MG tablet    Sig: Take 1 tablet by mouth every 6 (six) hours as needed for moderate pain.    Dispense:  30 tablet    Refill:  0    DO NOT FILL UNTIL 10/16/2018.  . baclofen (LIORESAL) 10 MG tablet    Sig: Take 0.5 tablets (5 mg total) by mouth 2 (two) times  daily as needed for muscle spasms.    Dispense:  10 each    Refill:  0  . Capsaicin-Menthol-Methyl Sal (CAPSAICIN-METHYL SAL-MENTHOL) 0.025-1-12 % CREA    Sig: Apply 1 application topically 4 (four) times daily.    Dispense:  1 Tube    Refill:  2    Harriet Butte, [facility], PGY-3 08/15/2018, 11:06 AM

## 2018-08-14 ENCOUNTER — Ambulatory Visit (INDEPENDENT_AMBULATORY_CARE_PROVIDER_SITE_OTHER): Payer: Medicare Other | Admitting: Family Medicine

## 2018-08-14 ENCOUNTER — Encounter: Payer: Self-pay | Admitting: Family Medicine

## 2018-08-14 ENCOUNTER — Other Ambulatory Visit: Payer: Self-pay

## 2018-08-14 VITALS — BP 122/68 | HR 62 | Temp 98.3°F | Wt 182.0 lb

## 2018-08-14 DIAGNOSIS — Z23 Encounter for immunization: Secondary | ICD-10-CM

## 2018-08-14 DIAGNOSIS — G8929 Other chronic pain: Secondary | ICD-10-CM | POA: Diagnosis not present

## 2018-08-14 DIAGNOSIS — M25551 Pain in right hip: Secondary | ICD-10-CM

## 2018-08-14 DIAGNOSIS — M25511 Pain in right shoulder: Secondary | ICD-10-CM | POA: Diagnosis not present

## 2018-08-14 DIAGNOSIS — G894 Chronic pain syndrome: Secondary | ICD-10-CM | POA: Diagnosis not present

## 2018-08-14 MED ORDER — HYDROCODONE-ACETAMINOPHEN 5-325 MG PO TABS: 1.0000 | ORAL_TABLET | Freq: Two times a day (BID) | ORAL | 0 refills | Status: DC | PRN

## 2018-08-14 MED ORDER — CAPSAICIN-MENTHOL-METHYL SAL 0.025-1-12 % EX CREA: 1.0000 "application " | TOPICAL_CREAM | Freq: Four times a day (QID) | CUTANEOUS | 2 refills | Status: DC

## 2018-08-14 MED ORDER — HYDROCODONE-ACETAMINOPHEN 5-325 MG PO TABS: 1.0000 | ORAL_TABLET | Freq: Four times a day (QID) | ORAL | 0 refills | Status: DC | PRN

## 2018-08-14 MED ORDER — BACLOFEN 10 MG PO TABS: 5.0000 mg | ORAL_TABLET | Freq: Two times a day (BID) | ORAL | 0 refills | Status: DC | PRN

## 2018-08-14 NOTE — Patient Instructions (Signed)
Thank you for coming in to see [name] today. Please see below to review our plan for today's visit.  1.  I called in a prescription for your pain medication.  Take only as prescribed.  He will only be able to fill one prescription at a time each month.  I will have you visit every 3 months for refills. 2.  I believe your headache is related to strain on your right trapezius which is a muscle body which can cause headache.  You can use the baclofen up to twice daily as needed.  Do not operate heavy machinery or drive while on this medication.  He can also apply the capsaicin cream to the area up to 4 times daily.  This is a very effective medication to help control pain.  Please call the clinic at [phone] if your symptoms worsen or you have any concerns. It was our pleasure to serve you.  Harriet Butte, [place], PGY-3

## 2018-08-15 DIAGNOSIS — M25511 Pain in right shoulder: Secondary | ICD-10-CM | POA: Insufficient documentation

## 2018-08-15 NOTE — Assessment & Plan Note (Addendum)
Chronic.  Secondary to multiple palpation secondary to DJD and underlying osteoarthritis.  Well-controlled with chronic use of opioids. PWP reviewed, no red flags. - Given electronic prescription for 68-month supply of hydrocodone-acetaminophen 5-325 mg with instructions to take every 6 hours as needed - Flu shot given

## 2018-08-15 NOTE — Assessment & Plan Note (Signed)
Acute.  Assistant with right trapezius strain.  No signs of radiculopathy.  Currently on opioids for chronic pain at multiple joints. - Given prescription for baclofen 5 mg twice daily as needed for muscle spasm along with capitation cream 4 times daily as needed - Reviewed return precautions

## 2018-08-28 ENCOUNTER — Telehealth: Payer: Self-pay | Admitting: Internal Medicine

## 2018-08-28 DIAGNOSIS — I5042 Chronic combined systolic (congestive) and diastolic (congestive) heart failure: Secondary | ICD-10-CM

## 2018-08-28 NOTE — Telephone Encounter (Signed)
I called pt after [facility] nurse left message. Pt confirms weight increase of 10.8 pounds in 48 days. Feet are swollen.  Does not think legs are, but can't tell for sure. abd is not tight. Not SOB Has dry cough.  Lasix every other day. Today was off day but she took dose today. Last labs 10/4, creatinine normal. Adv to take again tomorrow and come for BMET, BNP.  Pt states they are having car trouble and doesn't think can get a ride tomorrow.  She will plan to come Thursday for the labs.  Will route to [employer] and Dr. Harrington Challenger for review, further recommendations.

## 2018-08-28 NOTE — Telephone Encounter (Signed)
New Message   Pt c/o swelling: STAT is pt has developed SOB within 24 hours  1) How much weight have you gained and in what time span? 10.8lbs in 48 days  2) If swelling, where is the swelling located? Located in both of her feet and ankles   3) Are you currently taking a fluid pill? Yes  4) Are you currently SOB? No   5) Do you have a log of your daily weights (if so, list)? [facility] nurse will fax over list of daily weights  6) Have you gained 3 pounds in a day or 5 pounds in a week? No   7) Have you traveled recently? No    Per [facility] nurse Amber patient has complained of a dry cough. She normally takes lasix 20mg  every other day. But Monday and Tuesday she took the lasix both days. She also takes spironolactone.

## 2018-08-29 NOTE — Telephone Encounter (Signed)
Spoke with patient and informed. Coming tomorrow for blood work in order to help determine further dosing of diuretic therapy. Pt has not weighed yet today.

## 2018-08-29 NOTE — Telephone Encounter (Signed)
I would take 40 mg once and agree with labs later this week Watch salt!!

## 2018-08-30 ENCOUNTER — Other Ambulatory Visit: Payer: Medicare Other

## 2018-08-31 ENCOUNTER — Encounter (INDEPENDENT_AMBULATORY_CARE_PROVIDER_SITE_OTHER): Payer: Self-pay

## 2018-08-31 ENCOUNTER — Other Ambulatory Visit: Payer: Medicare Other

## 2018-08-31 DIAGNOSIS — I5042 Chronic combined systolic (congestive) and diastolic (congestive) heart failure: Secondary | ICD-10-CM | POA: Diagnosis not present

## 2018-09-01 LAB — BASIC METABOLIC PANEL
BUN/Creatinine Ratio: 14 (ref 12–28)
BUN: 10 mg/dL (ref 8–27)
CO2: 26 mmol/L (ref 20–29)
Calcium: 9.3 mg/dL (ref 8.7–10.3)
Chloride: 100 mmol/L (ref 96–106)
Creatinine, Ser: 0.73 mg/dL (ref 0.57–1.00)
GFR calc Af Amer: 95 mL/min/{1.73_m2} (ref >59)
GFR calc non Af Amer: 83 mL/min/{1.73_m2} (ref >59)
GLUCOSE: 111 mg/dL — AB (ref 65–99)
POTASSIUM: 3 mmol/L — AB (ref 3.5–5.2)
Sodium: 141 mmol/L (ref 134–144)

## 2018-09-01 LAB — PRO B NATRIURETIC PEPTIDE: NT-PRO BNP: 309 pg/mL — AB (ref 0–301)

## 2018-09-04 MED ORDER — POTASSIUM CHLORIDE CRYS ER 20 MEQ PO TBCR: 20.0000 meq | EXTENDED_RELEASE_TABLET | Freq: Every day | ORAL | 3 refills | Status: DC

## 2018-09-04 NOTE — Addendum Note (Signed)
Addended by: Rodman Key on: 09/04/2018 02:47 PM   Modules accepted: Orders

## 2018-09-04 NOTE — Telephone Encounter (Signed)
Lab results:  Lab Results  Component Value Date   K 3.0 (L) 08/31/2018   BUN 10 08/31/2018   CREATININE 0.73 08/31/2018   GLUCOSE 111 (H) 08/31/2018   PROBNP 309 (H) 08/31/2018    BNP not significantly elevated. Renal function normal. The potassium is low.  Current Meds: Lasix 20 mg QOD K+ 20 mEq QOD Spironolactone 25 mg QD ASA 81 Lipitor 10 Coreg 6.25 mg BID Entresto 24/26 mg BID  PLAN:  1. Increase K+ to 20 mEq Once daily  2. BMET 1 week.  [name], PA-C    09/04/2018 1:44 PM

## 2018-09-04 NOTE — Telephone Encounter (Signed)
Spoke with patient and informed.  She verbalizes understanding and agreement. Swelling in feet is better. She is monitoring sodium in her diet.

## 2018-09-12 ENCOUNTER — Other Ambulatory Visit: Payer: Medicare Other

## 2018-09-25 NOTE — Progress Notes (Deleted)
   Subjective   Patient ID: Donna Herrera    DOB: Aug 20, 1946, 72 y.o. female   MRN: [id number]  CC: "***"  HPI: Donna Herrera is a 72 y.o. female who presents to clinic today for the following:  Depression: ***  ROS: see HPI for pertinent.  [place]: HFrEF and NICM, smoker, HLD, HTN, GERD, DJD, chronic pain syndrome.  Surgical history total right hip arthroplasty, left hip fusion, C-section, cardiac cath x2, tubal, bilateral rotator cuff repair.  Surgical history DM, HTN, heart disease, CVA, depression smoking status reviewed. Medications reviewed.  Objective   There were no vitals taken for this visit. Vitals and nursing note reviewed.  General: well nourished, well developed, NAD with non-toxic appearance HEENT: normocephalic, atraumatic, moist mucous membranes Neck: supple, non-tender without lymphadenopathy Cardiovascular: regular rate and rhythm without murmurs, rubs, or gallops Lungs: clear to auscultation bilaterally with normal work of breathing Abdomen: soft, non-tender, non-distended, normoactive bowel sounds Skin: warm, dry, no rashes or lesions, cap refill < 2 seconds Extremities: warm and well perfused, normal tone, no edema  Assessment & Plan   No problem-specific Assessment & Plan notes found for this encounter.  No orders of the defined types were placed in this encounter.  No orders of the defined types were placed in this encounter.   Harriet Butte, [place], PGY-3 09/25/2018, 9:14 AM

## 2018-09-26 ENCOUNTER — Ambulatory Visit: Payer: Medicare Other | Admitting: Family Medicine

## 2018-10-08 ENCOUNTER — Other Ambulatory Visit: Payer: Self-pay | Admitting: Internal Medicine

## 2018-10-19 ENCOUNTER — Other Ambulatory Visit: Payer: Self-pay | Admitting: Family Medicine

## 2018-10-19 NOTE — Telephone Encounter (Signed)
Patient given 3 month supply on 08/14/2018. Should have another prescription. Unfortunately I cannot prescribe again until the 3rd refill has expired. Please advise.  Harriet Butte, [place], PGY-3

## 2018-10-19 NOTE — Telephone Encounter (Signed)
Pt would like to know if Dr. Yisroel Ramming could refill her Hydrocodone. She would like this sent to [facility] on [address].

## 2018-10-22 NOTE — Telephone Encounter (Signed)
Attempted to call patient to let her know that her RX request for hydrocodone will not be refilled until there third refill has expired.  There was no answer and no voicemail. If patient happens to call back , please inform her.  I will try again to reach her.  Donna Herrera, [place]

## 2018-10-22 NOTE — Telephone Encounter (Signed)
Pt called nurse line about her pain medication. I informed her should have another refill left on original rx as it was written for a 59mos supply. Pt understood.

## 2018-11-08 ENCOUNTER — Ambulatory Visit (INDEPENDENT_AMBULATORY_CARE_PROVIDER_SITE_OTHER): Payer: Medicare Other | Admitting: Family Medicine

## 2018-11-08 VITALS — BP 112/70 | HR 72 | Temp 98.8°F | Wt 178.0 lb

## 2018-11-08 DIAGNOSIS — G5603 Carpal tunnel syndrome, bilateral upper limbs: Secondary | ICD-10-CM | POA: Diagnosis not present

## 2018-11-08 DIAGNOSIS — E876 Hypokalemia: Secondary | ICD-10-CM | POA: Diagnosis not present

## 2018-11-08 DIAGNOSIS — I10 Essential (primary) hypertension: Secondary | ICD-10-CM | POA: Diagnosis not present

## 2018-11-08 DIAGNOSIS — G894 Chronic pain syndrome: Secondary | ICD-10-CM | POA: Diagnosis not present

## 2018-11-08 DIAGNOSIS — K029 Dental caries, unspecified: Secondary | ICD-10-CM

## 2018-11-08 MED ORDER — HYDROCODONE-ACETAMINOPHEN 5-325 MG PO TABS: 1.0000 | ORAL_TABLET | Freq: Four times a day (QID) | ORAL | 0 refills | Status: DC | PRN

## 2018-11-08 MED ORDER — HYDROCODONE-ACETAMINOPHEN 5-325 MG PO TABS: 1.0000 | ORAL_TABLET | Freq: Two times a day (BID) | ORAL | 0 refills | Status: DC | PRN

## 2018-11-08 NOTE — Patient Instructions (Signed)
Thank you for coming in to see [name] today. Please see below to review our plan for today's visit.  1.  Unfortunately I cannot refer you to a dentist.  It would be best if you reach out to your insurance company and discuss options about local dentist to see regarding her teeth.  Ultimately you may need to have the remainder of the broken tooth removed, otherwise you could continue to have pain and could lead to infection. 2.  Your hand numbness secondary to a condition called carpal tunnel syndrome.  You can purchase over-the-counter wrist splints called thumb spica splints.  Wear these at night to help prevent cutting off the nerve.  You should see improvement from your symptoms over the next couple of weeks after wearing these splints.  You do not need to wear them during the daytime. 3.  You can continue using Tylenol for your neck and headache with 650 mg every 6 hours.  Avoid using NSAIDs if possible but you can use ibuprofen up to 800 mg every 8 hours as needed for flares.  I have refilled your oxycodone, however because we gave you an additional 15 tablets during your last refill, you should not need her next prescription until 5th of February.  Please follow-up with me in 3 months.  Please call the clinic at [phone] if your symptoms worsen or you have any concerns. It was our pleasure to serve you.  Harriet Butte, [place], PGY-3

## 2018-11-08 NOTE — Progress Notes (Signed)
Subjective   Patient ID: Donna Herrera    DOB: [birth year], 73 y.o. female   MRN: [id number]  CC: "Medication refills"  HPI: Donna Herrera is a 73 y.o. female who presents to clinic today for the following:  Chronic neck pain: Patient here today to follow-up chronic neck pain.  She is currently on Norco 5-325 mg with 30 tablets monthly and says this does a good job at controlling her pain.  Her daughter who is accompanying her says she does not take them too often.  Patient has known rotator cuff disease and has had surgeries on both of her shoulders in the past.  She is a poor surgical candidate and is not interested in surgery today.  Patient denies motor weakness in her upper extremities, neck stiffness, fevers or chills, change in vision, chest pain or shortness of breath.  Dental pain: Patient was eating potatoes the other day and loss part of her tooth on her left maxillary region.  She has not seen a dentist since 2008.  Patient does report some associated pain near the site but has denied fevers or chills, or swelling.  Hand numbness: Patient reports bilateral hand numbness localized to the palms and thumb, index, and middle finger.  This is new for her.  She has been having this discomfort over the last few months.  She does recall shaking her hand in the middle the night to alleviate the numbness.  She has no weakness in her grip strength.  Headaches: Patient reports acute on chronic headaches localized to her occiput, mainly on her left.  This is a similar pain to her chronic shoulder pain.  Patient takes Tylenol occasionally.  She denies focal weakness, change in speech unstable gait, change in vision.  Hypokalemia: Patient recently seen by cardiology back in October and noted to have a low potassium at 3.0.  There is recommendation for her to follow-up, however she failed to do so.  She is currently on potassium supplements, and Entresto.  ROS: see HPI for  pertinent.  [place]: HFrEF and NICM, smoker, HLD, HTN, GERD, DJD, chronic pain syndrome.  Surgical history total right hip arthroplasty, left hip fusion, C-section, cardiac cath x2, tubal, bilateral rotator cuff repair.  Surgical history DM, HTN, heart disease, CVA, depression smoking status reviewed. Medications reviewed.  Objective   BP 112/70   Pulse 72   Temp 98.8 F (37.1 C)   Wt 178 lb (80.7 kg)   SpO2 98%   BMI 28.73 kg/m  Vitals and nursing note reviewed.  General: frail elderly female with bilateral crutches, pleasant, NAD with non-toxic appearance HEENT: normocephalic, atraumatic, moist mucous membranes, PERRLA, EOMI Neck: supple, non-tender without lymphadenopathy, tender on left trapezius with gentle palpation radiating up to left occiput Cardiovascular: regular rate and rhythm without murmurs, rubs, or gallops Lungs: clear to auscultation bilaterally with normal work of breathing Abdomen: soft, non-tender, non-distended, normoactive bowel sounds Skin: warm, dry, no rashes or lesions, cap refill < 2 seconds Extremities: warm and well perfused, normal tone, no edema  Assessment & Plan   Pain due to dental caries Acute.  Poor dentition.  No signs of abscess.  Afebrile. - Reviewed return precautions and instructed to follow-up with dentist  Hypokalemia Recently hypokalemic with failed follow-up with cardiology.  Currently on potassium supplements and Entresto. - Checking BMET  Chronic pain syndrome Chronic.  Multiple areas of pain including chronic right hip pain and left shoulder pain and neck pain.  Seem to be well controlled  with use of opioids.  Patient and family agreeable to slow wean as tolerated. - Given electronic refill for Norco 5-325 with instructions to take every 6 hours as needed with 25>20>15 tablets monthly - RTC 3 months  Bilateral carpal tunnel syndrome Chronic.  History very consistent with carpal tunnel.  No signs of thenar atrophy.  Neurovascular  intact.  Spares upper extremity making cervical nerve involvement unlikely. - Discussed importance of using thumb spica splints at night - RTC if symptoms do not improve  Orders Placed This Encounter  Procedures  . Basic metabolic panel   Meds ordered this encounter  Medications  . HYDROcodone-acetaminophen (NORCO/VICODIN) 5-325 MG tablet    Sig: Take 1 tablet by mouth 2 (two) times daily as needed for moderate pain.    Dispense:  25 tablet    Refill:  0    DO NOT FILL UNTIL 12/05/2018.  [name] HYDROcodone-acetaminophen (NORCO/VICODIN) 5-325 MG tablet    Sig: Take 1 tablet by mouth every 6 (six) hours as needed for moderate pain.    Dispense:  20 tablet    Refill:  0    DO NOT FILL UNTIL 01/04/2019.  [name] HYDROcodone-acetaminophen (NORCO/VICODIN) 5-325 MG tablet    Sig: Take 1 tablet by mouth every 6 (six) hours as needed for moderate pain.    Dispense:  15 tablet    Refill:  0    DO NOT FILL UNTIL 02/03/2019.    Harriet Butte, [place], PGY-3 11/09/2018, 9:44 AM

## 2018-11-09 ENCOUNTER — Encounter: Payer: Self-pay | Admitting: Family Medicine

## 2018-11-09 DIAGNOSIS — E876 Hypokalemia: Secondary | ICD-10-CM | POA: Insufficient documentation

## 2018-11-09 DIAGNOSIS — G5603 Carpal tunnel syndrome, bilateral upper limbs: Secondary | ICD-10-CM | POA: Insufficient documentation

## 2018-11-09 DIAGNOSIS — K029 Dental caries, unspecified: Secondary | ICD-10-CM | POA: Insufficient documentation

## 2018-11-09 LAB — BASIC METABOLIC PANEL
BUN / CREAT RATIO: 17 (ref 12–28)
BUN: 15 mg/dL (ref 8–27)
CHLORIDE: 103 mmol/L (ref 96–106)
CO2: 21 mmol/L (ref 20–29)
Calcium: 9.4 mg/dL (ref 8.7–10.3)
Creatinine, Ser: 0.86 mg/dL (ref 0.57–1.00)
GFR calc non Af Amer: 67 mL/min/{1.73_m2} (ref >59)
GFR, EST AFRICAN AMERICAN: 78 mL/min/{1.73_m2} (ref >59)
Glucose: 134 mg/dL — ABNORMAL HIGH (ref 65–99)
Potassium: 4.9 mmol/L (ref 3.5–5.2)
Sodium: 138 mmol/L (ref 134–144)

## 2018-11-09 NOTE — Assessment & Plan Note (Signed)
Recently hypokalemic with failed follow-up with cardiology.  Currently on potassium supplements and Entresto. - Checking BMET

## 2018-11-09 NOTE — Assessment & Plan Note (Signed)
Chronic.  History very consistent with carpal tunnel.  No signs of thenar atrophy.  Neurovascular intact.  Spares upper extremity making cervical nerve involvement unlikely. - Discussed importance of using thumb spica splints at night - RTC if symptoms do not improve

## 2018-11-09 NOTE — Assessment & Plan Note (Addendum)
Chronic.  Multiple areas of pain including chronic right hip pain and left shoulder pain and neck pain.  Seem to be well controlled with use of opioids.  Patient and family agreeable to slow wean as tolerated. - Given electronic refill for Norco 5-325 with instructions to take every 6 hours as needed with 25>20>15 tablets monthly - RTC 3 months

## 2018-11-09 NOTE — Assessment & Plan Note (Signed)
Acute.  Poor dentition.  No signs of abscess.  Afebrile. - Reviewed return precautions and instructed to follow-up with dentist

## 2018-12-07 ENCOUNTER — Other Ambulatory Visit: Payer: Self-pay | Admitting: Internal Medicine

## 2018-12-07 DIAGNOSIS — I428 Other cardiomyopathies: Secondary | ICD-10-CM

## 2018-12-07 DIAGNOSIS — I5042 Chronic combined systolic (congestive) and diastolic (congestive) heart failure: Secondary | ICD-10-CM

## 2018-12-07 MED ORDER — CARVEDILOL 6.25 MG PO TABS: 6.2500 mg | ORAL_TABLET | Freq: Two times a day (BID) | ORAL | 5 refills | Status: DC

## 2018-12-07 NOTE — Addendum Note (Signed)
Addended by: Christen Bame D on: 12/07/2018 02:44 PM   Modules accepted: Orders

## 2018-12-07 NOTE — Telephone Encounter (Signed)
Transmission failed.  Resent . Fleeger, Jessica Dawn, CMA  

## 2018-12-22 ENCOUNTER — Other Ambulatory Visit: Payer: Self-pay | Admitting: Internal Medicine

## 2018-12-24 MED ORDER — COLCHICINE 0.6 MG PO TABS: ORAL_TABLET | ORAL | 0 refills | Status: DC

## 2018-12-24 NOTE — Addendum Note (Signed)
Addended by: Esau Grew on: 12/24/2018 11:26 AM   Modules accepted: Orders

## 2018-12-31 ENCOUNTER — Other Ambulatory Visit: Payer: Medicare Other

## 2019-01-04 ENCOUNTER — Ambulatory Visit (HOSPITAL_COMMUNITY)
Admission: EM | Admit: 2019-01-04 | Discharge: 2019-01-04 | Disposition: A | Discharge status: Home / Self Care | Payer: Medicare Other | Attending: Family Medicine | Admitting: Family Medicine

## 2019-01-04 ENCOUNTER — Encounter (HOSPITAL_COMMUNITY): Payer: Self-pay | Admitting: Emergency Medicine

## 2019-01-04 DIAGNOSIS — R6889 Other general symptoms and signs: Secondary | ICD-10-CM | POA: Diagnosis not present

## 2019-01-04 MED ORDER — OSELTAMIVIR PHOSPHATE 75 MG PO CAPS: 75.0000 mg | ORAL_CAPSULE | Freq: Two times a day (BID) | ORAL | 0 refills | Status: DC

## 2019-01-04 MED ORDER — BENZONATATE 100 MG PO CAPS: 100.0000 mg | ORAL_CAPSULE | Freq: Three times a day (TID) | ORAL | 0 refills | Status: DC | PRN

## 2019-01-04 NOTE — ED Triage Notes (Signed)
Pt c/o cough and body aches x2 days.

## 2019-01-04 NOTE — ED Provider Notes (Signed)
[place]    CSN: [id number] Arrival date & time: 01/04/19  1853     History   Chief Complaint Chief Complaint  Patient presents with  . Cough  . Generalized Body Aches    HPI Donna Herrera is a 73 y.o. female.   Pt c/o cough and body aches x2 days. She has a h/o tobacco abuse and quit 4 years ago.  Cough is nonproductive and patient notes myalgias.  She is on chronic norco.  She did not have a flu shot, but did have pneumonia vaccine     Past Medical History:  Diagnosis Date  . Allergy   . Arthritis   . Candidiasis, mouth 10/19/2017  . Chronic combined systolic and diastolic CHF (congestive heart failure) ([place]) 10/21/2015   Echo 3/16: EF 40%, diffuse HK, mild MR, moderate LAE, mild RAE, PASP 42 mmHg  //  Echo 12/16: Mild LVH, EF 35-40%, diffuse HK, Gr 2 DD, MAC, mild MR, mod LAE, normal RVSF, PASP 49 mmHg // Echo 5/19: EF 15-20, diff HK, Gr 2 DD, trivial AI, MAC, trivial MR, mod LAE, normal RVSF, mild TR, PASP 62 // Limited Echo 8/19: mod LVH, EF 40-45, diff HK, Gr 1 DD, MAC, mild LAE   . Chronic left shoulder pain 04/10/2013  . Chronic lung disease   . Chronic pain   . Chronic pain syndrome 07/20/2017  . Depression 08/25/2016  . DJD (degenerative joint disease) 10/12/2012   Multiple joint replacements by Dr. Collier Salina.   . Dyshidrotic eczema 12/31/2014  . Essential hypertension 07/20/2017  . GERD (gastroesophageal reflux disease)   . Gout   . H/O slipped capital femoral epiphysis (SCFE)   . Hyperlipidemia   . Hypertension   . Hypertensive heart disease with CHF (congestive heart failure) ([place]) 10/12/2012  . Loss of taste 02/06/2018  . Mass of breast, left 07/21/2014  . NICM (nonischemic cardiomyopathy) ([place]) 06/09/2016   A. LHC 7/17: Normal coronary arteries, EF 30-35%, LVEDP 14 mmHg  . Pain in gums 04/18/2018  . Right hip pain 11/02/2012  . Tendonitis, Achilles, left 05/24/2017  . Tobacco abuse 09/29/2014  . Vitamin D deficiency 12/31/2014     Patient Active Problem List   Diagnosis Date Noted  . Hypokalemia 11/09/2018  . Pain due to dental caries 11/09/2018  . Bilateral carpal tunnel syndrome 11/09/2018  . Pain in gums 04/18/2018  . Loss of taste 02/06/2018  . Candidiasis, mouth 10/19/2017  . Chronic pain syndrome 07/20/2017  . Essential hypertension 07/20/2017  . Tendonitis, Achilles, left 05/24/2017  . Depression 08/25/2016  . NICM (nonischemic cardiomyopathy) ([place]) 06/09/2016  . Chronic combined systolic and diastolic CHF (congestive heart failure) ([place]) 10/21/2015  . Chronic lung disease   . Dyshidrotic eczema 12/31/2014  . Vitamin D deficiency 12/31/2014  . Tobacco abuse 09/29/2014  . Mass of breast, left 07/21/2014  . Hyperlipidemia 09/16/2013  . Chronic left shoulder pain 04/10/2013  . Right hip pain 11/02/2012  . Hypertensive heart disease with CHF (congestive heart failure) ([place]) 10/12/2012  . DJD (degenerative joint disease) 10/12/2012  . Encounter for chronic pain management 10/12/2012  . Gout 10/12/2012  . GERD (gastroesophageal reflux disease) 10/12/2012    Past Surgical History:  Procedure Laterality Date  . CARDIAC CATHETERIZATION  2005   normal-[place]  . CARDIAC CATHETERIZATION N/A 05/11/2016   Procedure: Right/Left Heart Cath and Coronary Angiography;  Surgeon: Peter M Martinique, MD;  Location: [facility];  Service: Cardiovascular;  Laterality: N/A;  . CESAREAN SECTION    .  HIP FUSION     left   . [place]  . ROTATOR CUFF REPAIR     bilateral  . TOTAL HIP ARTHROPLASTY  1987   right. Dr. Collier Salina  . TUBAL LIGATION      OB History   No obstetric history on file.      Home Medications    Prior to Admission medications   Medication Sig Start Date End Date Taking? Authorizing Provider  albuterol (PROVENTIL HFA;VENTOLIN HFA) 108 (90 Base) MCG/ACT inhaler Inhale 1-2 puffs into the lungs every 6 (six) hours as needed for wheezing. 02/25/18    Tanna Furry, MD  aspirin 81 MG tablet Take 1 tablet (81 mg total) by mouth daily. 04/16/15   Leeanne Rio, MD  atorvastatin (LIPITOR) 10 MG tablet TAKE 1 TABLET BY MOUTH EVERY DAY 01/12/18   Rogue Bussing, MD  baclofen (LIORESAL) 10 MG tablet Take 0.5 tablets (5 mg total) by mouth 2 (two) times daily as needed for muscle spasms. 08/14/18   [place] Bing, DO  benzonatate (TESSALON) 100 MG capsule Take 1-2 capsules (100-200 mg total) by mouth 3 (three) times daily as needed for cough. 01/04/19   Robyn Haber, MD  Capsaicin-Menthol-Methyl Sal (CAPSAICIN-METHYL SAL-MENTHOL) 0.025-1-12 % CREA Apply 1 application topically 4 (four) times daily. Patient not taking: Reported on 11/08/2018 08/14/18   [place] Bing, DO  carvedilol (COREG) 6.25 MG tablet Take 1 tablet (6.25 mg total) by mouth 2 (two) times daily. 12/07/18   [place] Bing, DO  cetirizine (ZYRTEC) 10 MG tablet TAKE 1 TABLET (10 MG TOTAL) BY MOUTH DAILY. 03/06/17   McKeag, Marylynn Pearson, MD  colchicine 0.6 MG tablet TAKE 1 TABLET BY MOUTH EVERY DAY AS NEEDED FOR GOUT FLARE 12/24/18   Alveda Reasons, MD  FLUoxetine (PROZAC) 20 MG tablet Take 1 tablet (20 mg total) by mouth daily. 12/14/17   Rogue Bussing, MD  furosemide (LASIX) 20 MG tablet Take 1 tablet (20 mg total) by mouth every other day. 07/19/18 10/17/18  [name] T, PA-C  HYDROcodone-acetaminophen (NORCO/VICODIN) 5-325 MG tablet Take 1 tablet by mouth 2 (two) times daily as needed for moderate pain. 11/08/18   [place] Bing, DO  HYDROcodone-acetaminophen (NORCO/VICODIN) 5-325 MG tablet Take 1 tablet by mouth every 6 (six) hours as needed for moderate pain. 11/08/18   [place] Bing, DO  HYDROcodone-acetaminophen (NORCO/VICODIN) 5-325 MG tablet Take 1 tablet by mouth every 6 (six) hours as needed for moderate pain. 11/08/18   [place] Bing, DO  oseltamivir (TAMIFLU) 75 MG capsule Take 1 capsule (75 mg total) by mouth every 12 (twelve) hours. 01/04/19    Robyn Haber, MD  pantoprazole (PROTONIX) 20 MG tablet TAKE 2 TABLETS BY MOUTH EVERY DAY 10/09/18    Bing, DO  potassium chloride SA (K-DUR,KLOR-CON) 20 MEQ tablet Take 1 tablet (20 mEq total) by mouth daily. 09/04/18   Weaver, Scott T, PA-C  sacubitril-valsartan (ENTRESTO) 24-26 MG Take 1 tablet by mouth 2 (two) times daily. 03/07/18   [name] T, PA-C  spironolactone (ALDACTONE) 25 MG tablet Take 1 tablet (25 mg total) by mouth daily. 02/06/18   Rogue Bussing, MD  triamcinolone ointment (KENALOG) 0.5 % Apply 1 application topically 2 (two) times daily. To palms Patient not taking: Reported on 11/08/2018 12/14/16   McKeag, Marylynn Pearson, MD  Vitamin D, Ergocalciferol, (DRISDOL) 50000 units CAPS capsule Take 1 capsule (50,000 Units total) by mouth every 7 (  seven) days. After 8 weeks, take over-the-counter vitamin D 2000 units daily. Patient not taking: Reported on 11/08/2018 02/12/18   Rogue Bussing, MD    Family History Family History  Problem Relation Age of Onset  . Depression Mother   . Diabetes Mother   . Hypertension Mother   . Stroke Mother   . Heart disease Mother   . Cancer Father        unknown primary  . Diabetes Sister   . Breast cancer Neg Hx     Social History Social History   Tobacco Use  . Smoking status: Former Smoker    Packs/day: 0.50  . Smokeless tobacco: Current User  . Tobacco comment: decreased smoking/1 pack every 3 days  Substance Use Topics  . Alcohol use: Yes    Comment: on holidays  . Drug use: No     Allergies   Crab [shellfish allergy] and Morphine and related   Review of Systems Review of Systems  Constitutional: Positive for activity change, appetite change, diaphoresis, fatigue and fever. Negative for chills.  HENT: Positive for congestion and rhinorrhea.   Respiratory: Positive for cough.   Gastrointestinal: Negative.   Musculoskeletal: Positive for myalgias.  Neurological: Positive for headaches.      Physical Exam Triage Vital Signs ED Triage Vitals  Enc Vitals Group     BP 01/04/19 1910 132/69     Pulse Rate 01/04/19 1910 68     Resp --      Temp 01/04/19 1910 98.4 F (36.9 C)     Temp src --      SpO2 01/04/19 1910 95 %     Weight --      Height --      Head Circumference --      Peak Flow --      Pain Score 01/04/19 1913 9     Pain Loc --      Pain Edu? --      Excl. in [place]? --    No data found.  Updated Vital Signs BP 132/69   Pulse 68   Temp 98.4 F (36.9 C)   SpO2 95%    Physical Exam Vitals signs and nursing note reviewed.  Constitutional:      General: She is not in acute distress.    Appearance: Normal appearance. She is obese. She is ill-appearing. She is not toxic-appearing.  HENT:     Head: Normocephalic and atraumatic.     Right Ear: Tympanic membrane, ear canal and external ear normal.     Left Ear: Tympanic membrane, ear canal and external ear normal.     Nose: Congestion present.     Mouth/Throat:     Pharynx: Oropharynx is clear.  Eyes:     Extraocular Movements: Extraocular movements intact.     Conjunctiva/sclera: Conjunctivae normal.     Pupils: Pupils are equal, round, and reactive to light.  Neck:     Musculoskeletal: Normal range of motion and neck supple.  Cardiovascular:     Rate and Rhythm: Normal rate and regular rhythm.     Heart sounds: Normal heart sounds.  Pulmonary:     Effort: Pulmonary effort is normal.     Breath sounds: Normal breath sounds.  Musculoskeletal: Normal range of motion.  Skin:    General: Skin is warm and dry.  Neurological:     General: No focal deficit present.     Mental Status: She is alert and oriented to person, place,  and time.  Psychiatric:        Mood and Affect: Mood normal.        Behavior: Behavior normal.      UC Treatments / Results  Labs (all labs ordered are listed, but only abnormal results are displayed) Labs Reviewed - No data to display  EKG None  Radiology No  results found.  Procedures Procedures (including critical care time)  Medications Ordered in UC Medications - No data to display  Initial Impression / Assessment and Plan / UC Course  I have reviewed the triage vital signs and the nursing notes.  Pertinent labs & imaging results that were available during my care of the patient were reviewed by me and considered in my medical decision making (see chart for details).    Final Clinical Impressions(s) / UC Diagnoses   Final diagnoses:  Flu-like symptoms   Discharge Instructions   None    ED Prescriptions    Medication Sig Dispense Auth. Provider   oseltamivir (TAMIFLU) 75 MG capsule Take 1 capsule (75 mg total) by mouth every 12 (twelve) hours. 10 capsule Robyn Haber, MD   benzonatate (TESSALON) 100 MG capsule Take 1-2 capsules (100-200 mg total) by mouth 3 (three) times daily as needed for cough. 40 capsule Robyn Haber, MD     Controlled Substance Prescriptions [place] Controlled Substance Registry consulted? Not Applicable   Robyn Haber, MD 01/04/19 [phone]

## 2019-01-14 ENCOUNTER — Ambulatory Visit (INDEPENDENT_AMBULATORY_CARE_PROVIDER_SITE_OTHER): Payer: Medicare Other | Admitting: Physician Assistant

## 2019-01-14 ENCOUNTER — Other Ambulatory Visit: Payer: Self-pay

## 2019-01-14 ENCOUNTER — Encounter: Payer: Self-pay | Admitting: Physician Assistant

## 2019-01-14 VITALS — BP 124/72 | HR 61 | Ht 66.0 in | Wt 187.0 lb

## 2019-01-14 DIAGNOSIS — I110 Hypertensive heart disease with heart failure: Secondary | ICD-10-CM

## 2019-01-14 DIAGNOSIS — I5042 Chronic combined systolic (congestive) and diastolic (congestive) heart failure: Secondary | ICD-10-CM

## 2019-01-14 NOTE — Progress Notes (Signed)
Cardiology Office Note:    Date:  01/14/2019   ID:  Donna Herrera, DOB [birth year], MRN [id number]  PCP:  [place] Bing, DO  Cardiologist:  Dorris Carnes, MD   Electrophysiologist:  None   Referring MD: [place] Bing, DO   Chief Complaint  Patient presents with  . Follow-up    CHF     History of Present Illness:    Donna Herrera is a 73 y.o. female with systolic heart failure secondary to non-ischemic cardiomyopathy with EF 35-40%, tobacco abuse, hypertension, hyperlipidemia. Cardiac catheterization in July 2017 demonstrated no CAD.  Echocardiogram in May 2019 demonstrated worsening LV function with an EF of 15-20%.  Her heart failure medications were adjusted.  She was last seen 03/20/2018.  A follow-up echocardiogram demonstrated improved LV function with an EF of 40-45%.   Donna Herrera returns for follow up. She went to urgent care about 10 days ago with flu like symptoms. Temp was normal at that visit.  She has not traveled outside of [place] and has had no contacts with confirmed COVID-19 patients.  She took Tamiflu and she feels much better.  She still has a minor cough with clear sputum.  She denies chest pain, shortness of breath, syncope, paroxysmal nocturnal dyspnea, leg swelling.    Prior CV studies:   The following studies were reviewed today:  Echo 06/06/18 Mod LVH, EF 40-45, diff HK, Gr 1 DD, MAC, trivial MR, mild LAE, trivial TR  Echo 03/06/2018 EF 15-20, diffuse HK, grade 2 diastolic dysfunction, trivial AI, MAC, trivial MR, moderate LAE, normal RVSF, mild TR, PASP 62  LHC 05/11/16 1. Normal coronary anatomy 2. Severe LV dysfunction with EF 30-35%. 3. Normal right heart and LV filling pressures.  Echo 10/05/15 Mild LVH, EF 35-40%, diffuse HK, grade 2 diastolic dysfunction, MAC, mild MR, moderate LAE, normal RVSF, PASP 49 mmHg  Echo 01/16/15 EF 40%, diffuse HK, mild MR, moderate LAE, mild RAE, PASP 42 mmHg  Past Medical History:   Diagnosis Date  . Allergy   . Arthritis   . Candidiasis, mouth 10/19/2017  . Chronic combined systolic and diastolic CHF (congestive heart failure) ([place]) 10/21/2015   Echo 3/16: EF 40%, diffuse HK, mild MR, moderate LAE, mild RAE, PASP 42 mmHg  //  Echo 12/16: Mild LVH, EF 35-40%, diffuse HK, Gr 2 DD, MAC, mild MR, mod LAE, normal RVSF, PASP 49 mmHg // Echo 5/19: EF 15-20, diff HK, Gr 2 DD, trivial AI, MAC, trivial MR, mod LAE, normal RVSF, mild TR, PASP 62 // Limited Echo 8/19: mod LVH, EF 40-45, diff HK, Gr 1 DD, MAC, mild LAE   . Chronic left shoulder pain 04/10/2013  . Chronic lung disease   . Chronic pain   . Chronic pain syndrome 07/20/2017  . Depression 08/25/2016  . DJD (degenerative joint disease) 10/12/2012   Multiple joint replacements by Dr. Collier Salina.   . Dyshidrotic eczema 12/31/2014  . Essential hypertension 07/20/2017  . GERD (gastroesophageal reflux disease)   . Gout   . H/O slipped capital femoral epiphysis (SCFE)   . Hyperlipidemia   . Hypertension   . Hypertensive heart disease with CHF (congestive heart failure) ([place]) 10/12/2012  . Loss of taste 02/06/2018  . Mass of breast, left 07/21/2014  . NICM (nonischemic cardiomyopathy) ([place]) 06/09/2016   A. LHC 7/17: Normal coronary arteries, EF 30-35%, LVEDP 14 mmHg  . Pain in gums 04/18/2018  . Right hip pain 11/02/2012  . Tendonitis, Achilles, left 05/24/2017  . Tobacco abuse  09/29/2014  . Vitamin D deficiency 12/31/2014   Surgical Hx: The patient  has a past surgical history that includes Cesarean section; Tubal ligation; Total hip arthroplasty (1987); Joint replacement (1991, 1997); Hip fusion; Rotator cuff repair; Cardiac catheterization (2005); and Cardiac catheterization (N/A, 05/11/2016).   Current Medications: Current Meds  Medication Sig  . albuterol (PROVENTIL HFA;VENTOLIN HFA) 108 (90 Base) MCG/ACT inhaler Inhale 1-2 puffs into the lungs every 6 (six) hours as needed for wheezing.  [name] aspirin 81 MG tablet Take 1  tablet (81 mg total) by mouth daily.  . baclofen (LIORESAL) 10 MG tablet Take 0.5 tablets (5 mg total) by mouth 2 (two) times daily as needed for muscle spasms.  . benzonatate (TESSALON) 100 MG capsule Take 1-2 capsules (100-200 mg total) by mouth 3 (three) times daily as needed for cough.  . Capsaicin-Menthol-Methyl Sal (CAPSAICIN-METHYL SAL-MENTHOL) 0.025-1-12 % CREA Apply 1 application topically 4 (four) times daily.  . carvedilol (COREG) 6.25 MG tablet Take 1 tablet (6.25 mg total) by mouth 2 (two) times daily.  . cetirizine (ZYRTEC) 10 MG tablet TAKE 1 TABLET (10 MG TOTAL) BY MOUTH DAILY.  [name] colchicine 0.6 MG tablet TAKE 1 TABLET BY MOUTH EVERY DAY AS NEEDED FOR GOUT FLARE  . FLUoxetine (PROZAC) 20 MG tablet Take 1 tablet (20 mg total) by mouth daily.  [name] HYDROcodone-acetaminophen (NORCO/VICODIN) 5-325 MG tablet Take 1 tablet by mouth every 6 (six) hours as needed for moderate pain.  [name] oseltamivir (TAMIFLU) 75 MG capsule Take 1 capsule (75 mg total) by mouth every 12 (twelve) hours.  . pantoprazole (PROTONIX) 20 MG tablet TAKE 2 TABLETS BY MOUTH EVERY DAY  . potassium chloride SA (K-DUR,KLOR-CON) 20 MEQ tablet Take 20 mEq by mouth every other day.  . sacubitril-valsartan (ENTRESTO) 24-26 MG Take 1 tablet by mouth 2 (two) times daily.  [name] spironolactone (ALDACTONE) 25 MG tablet Take 1 tablet (25 mg total) by mouth daily.  [name] triamcinolone ointment (KENALOG) 0.5 % Apply 1 application topically 2 (two) times daily. To palms  . Vitamin D, Ergocalciferol, (DRISDOL) 50000 units CAPS capsule Take 1 capsule (50,000 Units total) by mouth every 7 (seven) days. After 8 weeks, take over-the-counter vitamin D 2000 units daily.  . [DISCONTINUED] atorvastatin (LIPITOR) 10 MG tablet TAKE 1 TABLET BY MOUTH EVERY DAY  . [DISCONTINUED] HYDROcodone-acetaminophen (NORCO/VICODIN) 5-325 MG tablet Take 1 tablet by mouth 2 (two) times daily as needed for moderate pain.  . [DISCONTINUED] HYDROcodone-acetaminophen  (NORCO/VICODIN) 5-325 MG tablet Take 1 tablet by mouth every 6 (six) hours as needed for moderate pain.     Allergies:   Crab [shellfish allergy] and Morphine and related   Social History   Tobacco Use  . Smoking status: Former Smoker    Packs/day: 0.50  . Smokeless tobacco: Current User  . Tobacco comment: decreased smoking/1 pack every 3 days  Substance Use Topics  . Alcohol use: Yes    Comment: on holidays  . Drug use: No     Family Hx: The patient's family history includes Cancer in her father; Depression in her mother; Diabetes in her mother and sister; Heart disease in her mother; Hypertension in her mother; Stroke in her mother. There is no history of Breast cancer.  ROS:   Please see the history of present illness.    ROS All other systems reviewed and are negative.   EKGs/Labs/Other Test Reviewed:    EKG:  EKG is not ordered today.    Recent Labs: 02/06/2018: ALT 13 02/25/2018:  Hemoglobin 12.2; Platelets 184 08/31/2018: NT-Pro BNP 309 11/08/2018: BUN 15; Creatinine, Ser 0.86; Potassium 4.9; Sodium 138   Recent Lipid Panel Lab Results  Component Value Date/Time   CHOL 177 10/04/2015 01:41 AM   TRIG 78 10/04/2015 01:41 AM   HDL 50 10/04/2015 01:41 AM   CHOLHDL 3.5 10/04/2015 01:41 AM   LDLCALC 111 (H) 10/04/2015 01:41 AM    Physical Exam:    VS:  BP 124/72   Pulse 61   Ht 5' 6"  (1.676 m)   Wt 187 lb (84.8 kg)   BMI 30.18 kg/m     Wt Readings from Last 3 Encounters:  01/14/19 187 lb (84.8 kg)  11/08/18 178 lb (80.7 kg)  08/14/18 182 lb (82.6 kg)     Physical Exam  Constitutional: She is oriented to person, place, and time. She appears well-developed and well-nourished. No distress.  HENT:  Head: Normocephalic and atraumatic.  Neck: No JVD present.  Cardiovascular: Normal rate and regular rhythm.  No murmur heard. Pulmonary/Chest: She has no rales.  Abdominal: Soft.  Musculoskeletal:        General: No edema.  Neurological: She is alert and  oriented to person, place, and time.  Skin: Skin is warm and dry.    ASSESSMENT & PLAN:    Chronic combined systolic and diastolic CHF (congestive heart failure) (HCC) EF 40-45.  NYHA 2.  Volume stable.  Continue current Rx which includes beta-blocker, angiotensin receptor neprilysin inhibitor, mineralocorticoid receptor antagonist.    Hypertensive heart disease with chronic combined systolic and diastolic congestive heart failure ([place]) The patient's blood pressure is controlled on her current regimen.  Continue current therapy.      Dispo:  Return in about 6 months (around 07/17/2019) for Routine Follow Up, w/ Dr. Harrington Challenger, or [name], PA-C.   Medication Adjustments/Labs and Tests Ordered: Current medicines are reviewed at length with the patient today.  Concerns regarding medicines are outlined above.  Tests Ordered: No orders of the defined types were placed in this encounter.  Medication Changes: No orders of the defined types were placed in this encounter.   Signed, [name], PA-C  01/14/2019 12:05 PM    [facility] [address], [place], [place]  [address] Phone: [phone]; Fax: [phone]

## 2019-01-14 NOTE — Patient Instructions (Signed)
Medication Instructions:   These are your heart medications:  Aspirin 81 mg once daily   Carvedilol 6.25 mg twice daily (for blood pressure, lowers heart rate and treats congestive heart failure)  Entresto 24/26 mg twice daily (lowers blood pressure and treats congestive heart failure)  Spironolactone 25 mg once daily (treats blood pressure and congestive heart failure; it is a diuretic or "fluid pill")  Lasix (Furosemide) 20 mg one tablet every other day.  Kdur (potassium chloride) 20 mEq one tablet every other day (to replace potassium).  If you need a refill on your cardiac medications before your next appointment, please call your pharmacy.   Lab work: None   If you have labs (blood work) drawn today and your tests are completely normal, you will receive your results only by: [name] MyChart Message (if you have MyChart) OR . A paper copy in the mail If you have any lab test that is abnormal or we need to change your treatment, we will call you to review the results.  Testing/Procedures: None   Follow-Up: At [facility], you and your health needs are our priority.  As part of our continuing mission to provide you with exceptional heart care, we have created designated Provider Care Teams.  These Care Teams include your primary Cardiologist (physician) and Advanced Practice Providers (APPs -  Physician Assistants and Nurse Practitioners) who all work together to provide you with the care you need, when you need it. You will need a follow up appointment in:  6 months.  Please call our office 2 months in advance to schedule this appointment.  You may see Dorris Carnes, MD or [name], PA-C   Any Other Special Instructions Will Be Listed Below (If Applicable).

## 2019-01-15 ENCOUNTER — Other Ambulatory Visit: Payer: Self-pay

## 2019-01-15 MED ORDER — CETIRIZINE HCL 10 MG PO TABS: ORAL_TABLET | ORAL | 5 refills | Status: DC

## 2019-01-30 ENCOUNTER — Other Ambulatory Visit: Payer: Self-pay | Admitting: Family Medicine

## 2019-01-30 DIAGNOSIS — M25511 Pain in right shoulder: Secondary | ICD-10-CM

## 2019-01-30 DIAGNOSIS — G894 Chronic pain syndrome: Secondary | ICD-10-CM

## 2019-01-30 NOTE — Telephone Encounter (Signed)
Pt needs refill on her pain med and her muscle relaxer. Please advise

## 2019-01-30 NOTE — Telephone Encounter (Signed)
Patient called requesting muscle relaxer's due to right shoulder pain. Please give patient a call back.

## 2019-01-31 ENCOUNTER — Telehealth: Payer: Self-pay | Admitting: Physician Assistant

## 2019-01-31 ENCOUNTER — Other Ambulatory Visit: Payer: Self-pay | Admitting: Family Medicine

## 2019-01-31 ENCOUNTER — Other Ambulatory Visit: Payer: Self-pay

## 2019-01-31 MED ORDER — BACLOFEN 10 MG PO TABS: 5.0000 mg | ORAL_TABLET | Freq: Two times a day (BID) | ORAL | 0 refills | Status: DC | PRN

## 2019-01-31 MED ORDER — HYDROCODONE-ACETAMINOPHEN 5-325 MG PO TABS: 1.0000 | ORAL_TABLET | Freq: Four times a day (QID) | ORAL | 0 refills | Status: DC | PRN

## 2019-01-31 MED ORDER — SACUBITRIL-VALSARTAN 24-26 MG PO TABS: 1.0000 | ORAL_TABLET | Freq: Two times a day (BID) | ORAL | 3 refills | Status: DC

## 2019-01-31 NOTE — Telephone Encounter (Signed)
Rx sent.  Donna Herrera, [place], PGY-3

## 2019-01-31 NOTE — Telephone Encounter (Signed)
°*  STAT* If patient is at the pharmacy, call can be transferred to refill team.   1. Which medications need to be refilled? (please list name of each medication and dose if known)  sacubitril-valsartan (ENTRESTO) 24-26 MG  2. Which pharmacy/location (including street and city if local pharmacy) is medication to be sent to?  [facility] [place] [address] [address] [place] Fax: [phone]  3. Do they need a 30 day or 90 day supply? 56  Pt states [name] needs to send in a paper  application for her medicine. She has been doing things mail order, because it was cheaper than going to the pharmacy in person.   She only has about a week worth of medication left

## 2019-02-01 NOTE — Telephone Encounter (Addendum)
**Note De-Identified  Obfuscation** Per the [place] at [employer] pt asst they do have the pts part and are waiting on the providers part of the application and the only way I can take care of this is to fax the provider part of the application and RX to them.

## 2019-02-01 NOTE — Telephone Encounter (Signed)
Will forward to April Garrison, [place] for her assistance as I am working from home at this time.

## 2019-02-01 NOTE — Telephone Encounter (Signed)
**Note De-Identified Herrera Obfuscation** Donna Hawking Via, LPN I think this pt is requesting pt assistance. Please address

## 2019-02-01 NOTE — Telephone Encounter (Signed)
The pt states that she has completed her [employer] pt asst application and that [facility] is waiting on the providers part of the application. I will contact [facility] as they have not reached out to [name] for the provider part of the pts application.

## 2019-02-04 NOTE — Telephone Encounter (Signed)
Follow up   Pt is calling back   Please call  

## 2019-02-04 NOTE — Telephone Encounter (Signed)
Filled out providers part of the [employer] Pt Asst application and faxed in.  Dr Tamala Julian was DOD so he signed the application since no other MD's were available.

## 2019-02-04 NOTE — Telephone Encounter (Signed)
I have notified the pt that we have faxed the provider part of her [employer] PT Asst application to [employer]. She thanked me for the update.

## 2019-02-06 NOTE — Telephone Encounter (Signed)
Please advise. Thanks.  

## 2019-02-06 NOTE — Telephone Encounter (Signed)
Per pt call - stated she would like samples of ENTRESTO 24-26 mg.  plesae give her a call back when ready last pill today.

## 2019-02-06 NOTE — Telephone Encounter (Signed)
The pt states that she called [employer] today and was advised that we never faxed them the provider part of her [employer] PT ASST application for her Delene Loll.  I called [employer] and s/w [place] who advised me that they received the provider part on Monday of this week and that the application is moving through the process.  I called the pt back with this information. I have asked her to call [employer] daily to check the progression of her application as she is out of [place].  Also I have advised her that we are placing a bottle of Entresto samples (28 tabs) downstairs in the office lobby and that she must stop at the Covid 19 screening table to ask for her samples.  She verbalized understanding to all info given.

## 2019-02-16 ENCOUNTER — Other Ambulatory Visit: Payer: Self-pay | Admitting: Family Medicine

## 2019-02-18 ENCOUNTER — Telehealth: Payer: Self-pay | Admitting: Interventional Cardiology

## 2019-02-18 NOTE — Telephone Encounter (Signed)
Called pharmacist back to clarify directions and strength of the medication Entresto. Pharmacist verbalized understanding.

## 2019-02-18 NOTE — Telephone Encounter (Signed)
Follow Up:  When you call just ask for a Pharmacist, not Lorriane Shire.    They need clarification on pt's Entresto strength.

## 2019-02-22 ENCOUNTER — Other Ambulatory Visit: Payer: Self-pay | Admitting: Family Medicine

## 2019-02-22 DIAGNOSIS — G894 Chronic pain syndrome: Secondary | ICD-10-CM

## 2019-02-22 NOTE — Telephone Encounter (Signed)
Pt called to see If she could get a presciption of HYDROCODONE. Please give pt a call back.

## 2019-02-22 NOTE — Telephone Encounter (Signed)
Rx filled 01/31/2019 for #15 tabs, no refills. This is 1 month supply. Will not fill earlier than 03/02/2019. Please advise.  Harriet Butte, [place], PGY-3

## 2019-02-25 ENCOUNTER — Other Ambulatory Visit: Payer: Self-pay

## 2019-02-25 DIAGNOSIS — G894 Chronic pain syndrome: Secondary | ICD-10-CM

## 2019-02-26 ENCOUNTER — Other Ambulatory Visit: Payer: Self-pay | Admitting: Internal Medicine

## 2019-02-26 ENCOUNTER — Other Ambulatory Visit: Payer: Self-pay | Admitting: Family Medicine

## 2019-02-26 MED ORDER — HYDROCODONE-ACETAMINOPHEN 5-325 MG PO TABS: 1.0000 | ORAL_TABLET | Freq: Four times a day (QID) | ORAL | 0 refills | Status: DC | PRN

## 2019-02-27 NOTE — Telephone Encounter (Signed)
Pt informed. She stated that she is having hip and shoulder pain. Offered her an appt and she declined. Offered her a tele medicine appt but she said she only wanted to speak to you directly about it. Please advise.  Kennon Holter, CMA

## 2019-02-28 NOTE — Telephone Encounter (Signed)
LVM for pt to give [name] a call back. I stated the message that was left by Dr. Yisroel Ramming. Salvatore Marvel, CMA

## 2019-02-28 NOTE — Telephone Encounter (Signed)
I am not available for telemed visits until Monday. Currently on night float trough the weekend and will be unavailable. Would advise patient to schedule telemed visit if her acute pain cannot wait.  Harriet Butte, [place], PGY-3

## 2019-03-18 ENCOUNTER — Encounter: Payer: Self-pay | Admitting: Family Medicine

## 2019-03-18 ENCOUNTER — Other Ambulatory Visit: Payer: Self-pay

## 2019-03-18 ENCOUNTER — Ambulatory Visit (INDEPENDENT_AMBULATORY_CARE_PROVIDER_SITE_OTHER): Payer: Medicare Other | Admitting: Family Medicine

## 2019-03-18 VITALS — BP 135/70 | HR 70

## 2019-03-18 DIAGNOSIS — G8929 Other chronic pain: Secondary | ICD-10-CM

## 2019-03-18 DIAGNOSIS — M25551 Pain in right hip: Secondary | ICD-10-CM

## 2019-03-18 DIAGNOSIS — G894 Chronic pain syndrome: Secondary | ICD-10-CM | POA: Diagnosis not present

## 2019-03-18 MED ORDER — HYDROCODONE-ACETAMINOPHEN 5-325 MG PO TABS: 1.0000 | ORAL_TABLET | Freq: Four times a day (QID) | ORAL | 0 refills | Status: DC | PRN

## 2019-03-18 MED ORDER — BACLOFEN 10 MG PO TABS: 5.0000 mg | ORAL_TABLET | Freq: Two times a day (BID) | ORAL | 0 refills | Status: DC | PRN

## 2019-03-18 NOTE — Patient Instructions (Signed)
Thank you for coming in to see [name] today. Please see below to review our plan for today's visit.  I increased your hydrocodone to 30 tablets/month.  You can take 1-2 tablets every 6 hours as needed for pain.  I would like to see you again in 1 month to reassess.  My main concern is the history of hip replacement.  This needs to be evaluated by an orthopedic surgeon.  I placed an urgent referral for you to return to [place].  They should reach out to you to schedule the appointment.  I also called in a prescription for baclofen which she can take as needed for muscle spasms.  Be sure not to drive or operate heavy machinery on this medication as this can cause drowsiness.  Please call the clinic at [phone] if your symptoms worsen or you have any concerns. It was our pleasure to serve you.  Harriet Butte, [place], PGY-3

## 2019-03-18 NOTE — Assessment & Plan Note (Addendum)
Worsening right hip pain over the last 2 weeks.  History of total right hip for patient report.  History of surgical refusal.  Previously well controlled with opioid use which has gradually been weaned due to stable control and decreased use of narcotics.  Seems to be exacerbated since wean.  No history of trauma or fall.  She appeared to the bear weight when getting onto exam table.  Clinically no signs of septic arthritis or dislocation/fracture.  Clearly in pain with movement but otherwise pleasant.  This seems to be close to her baseline.  PDMP reviewed, no red flags.  No history of constipation without bowel regimen. - Increasing Norco with electronic refill #30 tablets, 2 refills with instructions to take 1-2 tabs every 6 hours as needed - Refill baclofen 10 mg daily as needed for muscle spasms - Advised to avoid NSAIDs and use crutches at all times - Urgent referral placed for orthopedic surgical evaluation - Reviewed return precautions, RTC 1 month or sooner if needed

## 2019-03-18 NOTE — Progress Notes (Signed)
Subjective   Patient ID: Donna Herrera    DOB: Feb 24, 1946, 73 y.o. female   MRN: [id number]  CC: "Right hip pain"  HPI: Donna Herrera is a 73 y.o. female who presents to clinic today for the following:  Acute on chronic right hip pain: Donna Herrera is presenting with worsening right hip pain since the beginning of May.  She is well known to me with chronic pain requiring chronic opioid use with Norco every 6 hours as needed with #15 tablets monthly.  This has been gradually weaned since I inherited her care back in October 2019 she was previously on Norco every 6 hours as needed #45 tablets monthly as well controlled and she was reporting only using approximately #30 tablets monthly.  Her pain is typically on multiple joints including her right shoulder, low back, cervical spine.  She is presenting today with worsening right hip pain.  She denies any trauma or fall.  Her last DEXA was normal in 2010.  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  She has a also reports using Tylenol without improvement but avoids NSAIDs due to her history of heart failure.  She denies any chronic opioid side effects including constipation.  ROS: see HPI for pertinent.  [place]: Reviewed. Smoking status reviewed. Medications reviewed.  Objective   BP 135/70   Pulse 70   SpO2 97%  Vitals and nursing note reviewed.  General: chronically ill-appearing female with crutches, acute distress with movement of right hip with non-toxic appearance HEENT: normocephalic, atraumatic, moist mucous membranes Cardiovascular: regular rate and rhythm without murmurs, rubs, or gallops Lungs: clear to auscultation bilaterally with normal work of breathing Abdomen: soft, non-tender, non-distended, normoactive bowel sounds Skin: warm, dry, no rashes or lesions, cap refill < 2 seconds Extremities: warm and well  perfused, normal tone, no edema MSK: able to lift herself to exam table without assistance, significant tenderness to right and left hip with right greater than left particularly on the posterior surface, 4/5 motor function at right hip flexion compared to 5/5 on left although significantly limited secondary to tenderness, otherwise difficult to evaluate ROM, no palpable fluctuance, symmetrical leg length without abnormal internal or external rotation, 2+ dorsal pedal pulse bilaterally  Assessment & Plan   Acute right hip pain Worsening right hip pain over the last 2 weeks.  History of total right hip for patient report.  History of surgical refusal.  Previously well controlled with opioid use which has gradually been weaned due to stable control and decreased use of narcotics.  Seems to be exacerbated since wean.  No history of trauma or fall.  She appeared to the bear weight when getting onto exam table.  Clinically no signs of septic arthritis or dislocation/fracture.  Clearly in pain with movement but otherwise pleasant.  This seems to be close to her baseline.  PDMP reviewed, no red flags.  No history of constipation without bowel regimen. - Increasing Norco with electronic refill #30 tablets, 2 refills with instructions to take 1-2 tabs every 6 hours as needed - Refill baclofen 10 mg daily as needed for muscle spasms - Advised to avoid NSAIDs and use crutches at all times - Urgent referral placed for orthopedic surgical evaluation - Reviewed return precautions, RTC 1 month or sooner if needed  Orders Placed This Encounter  Procedures  . Ambulatory referral to Orthopedic Surgery    Referral Priority:  Urgent    Referral Type:   Surgical    Referral Reason:   Specialty Services Required    Requested Specialty:   Orthopedic Surgery    Number of Visits Requested:   1   Meds ordered this encounter  Medications  . baclofen (LIORESAL) 10 MG tablet    Sig: Take 0.5 tablets (5 mg total) by  mouth 2 (two) times daily as needed for muscle spasms.    Dispense:  10 each    Refill:  0  . HYDROcodone-acetaminophen (NORCO/VICODIN) 5-325 MG tablet    Sig: Take 1-2 tablets by mouth every 6 (six) hours as needed for moderate pain.    Dispense:  30 tablet    Refill:  0  . HYDROcodone-acetaminophen (NORCO/VICODIN) 5-325 MG tablet    Sig: Take 1 tablet by mouth every 6 (six) hours as needed for moderate pain.    Dispense:  30 tablet    Refill:  0    DO NOT FILL UNTIL 04/18/2019.  [name] HYDROcodone-acetaminophen (NORCO/VICODIN) 5-325 MG tablet    Sig: Take 1 tablet by mouth every 6 (six) hours as needed for moderate pain. DO NOT FILL UNTIL 05/18/2019.    Dispense:  30 tablet    Refill:  0    Donna Butte, DO [place], PGY-3 03/18/2019, 11:51 AM

## 2019-03-25 ENCOUNTER — Other Ambulatory Visit: Payer: Self-pay | Admitting: Physician Assistant

## 2019-03-28 DIAGNOSIS — Z96641 Presence of right artificial hip joint: Secondary | ICD-10-CM | POA: Diagnosis not present

## 2019-03-28 DIAGNOSIS — Z471 Aftercare following joint replacement surgery: Secondary | ICD-10-CM | POA: Diagnosis not present

## 2019-04-02 ENCOUNTER — Encounter: Payer: Self-pay | Admitting: Family Medicine

## 2019-04-02 ENCOUNTER — Ambulatory Visit (INDEPENDENT_AMBULATORY_CARE_PROVIDER_SITE_OTHER): Payer: Medicare Other | Admitting: Family Medicine

## 2019-04-02 ENCOUNTER — Other Ambulatory Visit: Payer: Self-pay

## 2019-04-02 VITALS — BP 126/80 | HR 68

## 2019-04-02 DIAGNOSIS — Z96641 Presence of right artificial hip joint: Secondary | ICD-10-CM | POA: Diagnosis not present

## 2019-04-02 HISTORY — DX: Presence of right artificial hip joint: Z96.641

## 2019-04-02 NOTE — Progress Notes (Signed)
   Subjective   Patient ID: Donna Herrera    DOB: 11-Nov-1945, 73 y.o. female   MRN: [id number]  CC: "Right hip pain"  HPI: Donna Herrera is a 73 y.o. female who presents to clinic today for the following:  Right hip pain: Donna Herrera has a longstanding history of right hip pain with a total hip back in the 1980s.  She is currently seeing [facility] with Dr. Gladstone Lighter and is here today to receive blood work to rule out sources of infection.  She denies history of fevers or chills.  She has increased her hydrocodone to no more than 1-2 tabs per day but is not interested in increasing her dose due to concerns about drowsiness.  She was informed that she has significant arthritic changes to the right hip and may require surgery.  She is open to this.  She continues to struggle with ADLs, primarily with ambulation and climbing stairs.  She uses crutches to get around.  ROS: see HPI for pertinent.  [place]: Reviewed. Smoking status reviewed. Medications reviewed.  Objective   BP 126/80 (BP Location: Left Arm, Patient Position: Standing, Cuff Size: Normal)   Pulse 68   SpO2 98%  Vitals and nursing note reviewed.  General: well nourished, well developed, NAD with non-toxic appearance HEENT: normocephalic, atraumatic, moist mucous membranes Lungs: normal work of breathing Skin: warm, dry, no rashes or lesions, cap refill < 2 seconds Extremities: warm and well perfused, normal tone, no edema, left leg unable to flex due to history of fusion, right leg flexed at right hip to approximately 30 degrees limited primarily to tenderness, right hip tender with gentle palpation without focal tenderness, neurovascular intact  Assessment & Plan   History of total right hip replacement Worsening chronic pain requiring opioids.  Recently increased from 15 to 30 tablets monthly which is still below patient's original 45 tablets last year at which point we decreased the dose due to  well-controlled pain.  I do have concerns that she will likely need a hip replacement.  Less likely septic hip, however will need to check basic labs to rule out.  Currently followed by orthopedic surgery.  Clearly needs pain control for the time being but is concerned about increasing her dose of hydrocodone. - Instructed to contact clinic if she needs additional hydrocodone tablets - Checking CBC with differential, CRP, ESR per orthopedic recommendations - Reviewed return precautions, RTC in approximately 1 month  Orders Placed This Encounter  Procedures  . CBC With Differential  . Sedimentation Rate  . C-reactive protein   No orders of the defined types were placed in this encounter.   Harriet Butte, [place], PGY-3 04/02/2019, 5:22 PM

## 2019-04-02 NOTE — Assessment & Plan Note (Signed)
Worsening chronic pain requiring opioids.  Recently increased from 15 to 30 tablets monthly which is still below patient's original 45 tablets last year at which point we decreased the dose due to well-controlled pain.  I do have concerns that she will likely need a hip replacement.  Less likely septic hip, however will need to check basic labs to rule out.  Currently followed by orthopedic surgery.  Clearly needs pain control for the time being but is concerned about increasing her dose of hydrocodone. - Instructed to contact clinic if she needs additional hydrocodone tablets - Checking CBC with differential, CRP, ESR per orthopedic recommendations - Reviewed return precautions, RTC in approximately 1 month

## 2019-04-02 NOTE — Patient Instructions (Addendum)
Thank you for coming in to see [name] today. Please see below to review our plan for today's visit.  Follow-up with Dr. Gladstone Lighter as we discussed.  Call our clinic if you need additional pain medication.   Please call the clinic at [phone] if your symptoms worsen or you have any concerns. It was our pleasure to serve you.  Harriet Butte, [place], PGY-3

## 2019-04-03 ENCOUNTER — Encounter: Payer: Self-pay | Admitting: Family Medicine

## 2019-04-03 LAB — CBC WITH DIFFERENTIAL
Basophils Absolute: 0 10*3/uL (ref 0.0–0.2)
Basos: 1 %
EOS (ABSOLUTE): 0.2 10*3/uL (ref 0.0–0.4)
Eos: 5 %
Hematocrit: 38.1 % (ref 34.0–46.6)
Hemoglobin: 11.9 g/dL (ref 11.1–15.9)
Immature Grans (Abs): 0 10*3/uL (ref 0.0–0.1)
Immature Granulocytes: 0 %
Lymphocytes Absolute: 2.1 10*3/uL (ref 0.7–3.1)
Lymphs: 47 %
MCH: 28.3 pg (ref 26.6–33.0)
MCHC: 31.2 g/dL — ABNORMAL LOW (ref 31.5–35.7)
MCV: 91 fL (ref 79–97)
Monocytes Absolute: 0.4 10*3/uL (ref 0.1–0.9)
Monocytes: 9 %
Neutrophils Absolute: 1.7 10*3/uL (ref 1.4–7.0)
Neutrophils: 38 %
RBC: 4.21 x10E6/uL (ref 3.77–5.28)
RDW: 12.5 % (ref 11.7–15.4)
WBC: 4.5 10*3/uL (ref 3.4–10.8)

## 2019-04-03 LAB — SEDIMENTATION RATE: Sed Rate: 105 mm/hr — ABNORMAL HIGH (ref 0–40)

## 2019-04-03 LAB — C-REACTIVE PROTEIN: CRP: 3 mg/L (ref 0–10)

## 2019-04-09 ENCOUNTER — Telehealth: Payer: Self-pay | Admitting: Physician Assistant

## 2019-04-09 NOTE — Telephone Encounter (Signed)
New Message             Pt c/o swelling: STAT is pt has developed SOB within 24 hours  1) How much weight have you gained and in what time span? 3 pds in 10 days  2) If swelling, where is the swelling located?Ankles/Knees and feet  3) Are you currently taking a fluid pill? Yes  Are you currently SOB? No  4) Do you have a log of your daily weights (if so, list)? Yes  5) Have you gained 3 pounds in a day or 5 pounds in a week? No   6) Have you traveled recently? No             Pls call patient     Amber calling from [facility] case manager is calling to report information. Amber's # [phone] ext [phone]

## 2019-04-10 ENCOUNTER — Other Ambulatory Visit: Payer: Self-pay

## 2019-04-10 DIAGNOSIS — G894 Chronic pain syndrome: Secondary | ICD-10-CM

## 2019-04-10 MED ORDER — HYDROCODONE-ACETAMINOPHEN 5-325 MG PO TABS: 1.0000 | ORAL_TABLET | Freq: Four times a day (QID) | ORAL | 0 refills | Status: DC | PRN

## 2019-04-10 NOTE — Telephone Encounter (Signed)
Reviewed Dr. Ericka Pontiff recent [place]. I am covering his box. He predicted that she may need additional pills due to acute pain. I will refill because these are special circumstances, and she should contact our office to be seen for a visit if her pain remains poorly controlled or if she develops additional symptoms such as fever.

## 2019-04-10 NOTE — Telephone Encounter (Signed)
Patient calls nurse line requesting a refill on her pain medication. Patient stated she was told to call the office, by PCP, if she needed anymore. Please advise.

## 2019-04-15 ENCOUNTER — Ambulatory Visit (INDEPENDENT_AMBULATORY_CARE_PROVIDER_SITE_OTHER): Payer: Medicare Other | Admitting: Family Medicine

## 2019-04-15 ENCOUNTER — Encounter: Payer: Self-pay | Admitting: Family Medicine

## 2019-04-15 ENCOUNTER — Other Ambulatory Visit: Payer: Self-pay

## 2019-04-15 VITALS — BP 146/76 | HR 60 | Wt 191.6 lb

## 2019-04-15 DIAGNOSIS — R54 Age-related physical debility: Secondary | ICD-10-CM

## 2019-04-15 DIAGNOSIS — M1A0710 Idiopathic chronic gout, right ankle and foot, without tophus (tophi): Secondary | ICD-10-CM | POA: Diagnosis not present

## 2019-04-15 DIAGNOSIS — Z96641 Presence of right artificial hip joint: Secondary | ICD-10-CM

## 2019-04-15 DIAGNOSIS — I10 Essential (primary) hypertension: Secondary | ICD-10-CM

## 2019-04-15 MED ORDER — COLCHICINE 0.6 MG PO TABS: 0.6000 mg | ORAL_TABLET | Freq: Every day | ORAL | 0 refills | Status: DC

## 2019-04-15 NOTE — Assessment & Plan Note (Signed)
In need of right hip replacement.  High risk for falls.  No recent history of falls.  Currently quiring opioids for pain control and ambulates with crutches.  Does have weakness especially with her right lower extremity has expected.  Clearly would benefit from a hospital bed. - DME order placed for hospital bed - RTC in 2 weeks following orthopedic follow-up

## 2019-04-15 NOTE — Assessment & Plan Note (Signed)
Likely source of right pinky toe pain.  Not on controller. - Given new prescription for colchicine with instructions to take 1.2 mg's afternoon followed by an additional 0.6 mg tonight and daily thereafter

## 2019-04-15 NOTE — Progress Notes (Signed)
Subjective   Patient ID: Donna Herrera    DOB: September 14, 1946, 73 y.o. female   MRN: [id number]  CC: "Gout"  HPI: Donna Herrera is a 73 y.o. female who presents to clinic today for the following:  Right pinky toe pain: Donna Herrera reports 24 hours of right pinky toe pain.  She also reported left big toe pain which resolved since yesterday.  She attributed this to lower extremity swelling but has concerns that her toe pain is gout since this feels similar to prior gout attacks.  She cannot recall the last time she had a gout attack but she is out of her colchicine.  She denies any trauma to her feet.  Frail elderly with need for right hip replacement: Patient is currently being seen by orthopedics for iteration of right hip revision.  Her pain is controlled with hydrocodone 1-2 tabs daily.  She is requesting a hospital bed to allow her to ambulate more easily, especially given her dependence with crutches to ambulate.  She also states that she needs to elevate her head at night to sleep at least 30 degrees.   Hypertension: She reports good adherence to her blood pressure medications and is taking her Lasix every other day as instructed by her cardiologist, although her medication list does not reflect this.  She has tried reaching out to a nurse through the cardiology office but is waiting to hear back.  She does not check her blood pressure at home.  Does have a history of HFrEF with improved EF of 40-45% as is August 2019.  ROS: see HPI for pertinent.  [place]: Reviewed. Smoking status reviewed. Medications reviewed.  Objective   BP (!) 146/76   Pulse 60   Wt 191 lb 9.6 oz (86.9 kg)   SpO2 97%   BMI 30.93 kg/m  Vitals and nursing note reviewed.  General: Frail elderly female using crutches to ambulate, NAD with non-toxic appearance HEENT: normocephalic, atraumatic, moist mucous membranes Neck: supple, non-tender without lymphadenopathy, absent JVD Cardiovascular:  regular rate and rhythm without murmurs, rubs, or gallops Lungs: clear to auscultation bilaterally with normal work of breathing Abdomen: soft, non-tender, non-distended, normoactive bowel sounds Skin: warm, dry, no rashes or lesions Extremities: warm and well perfused, normal tone, trace pitting edema up to knees bilaterally, right pinky toe exquisitely tender without erythema or induration, 5/5 motor strength on left lower extremity as compared to 3/5 on right lower extremity, 4/5 motor strength on upper extremities bilaterally  Assessment & Plan   Frail elderly In need of right hip replacement.  High risk for falls.  No recent history of falls.  Currently quiring opioids for pain control and ambulates with crutches.  Does have weakness especially with her right lower extremity has expected.  Clearly would benefit from a hospital bed. - DME order placed for hospital bed - RTC in 2 weeks following orthopedic follow-up  Essential hypertension Chronic.  Suboptimal control. - Advised to continue current antihypertensive regimen - May benefit from cardiology follow-up given patient's concern for worsening lower extremity swelling and need for cardiology clearance for possible right hip replacement in the future  Gout Likely source of right pinky toe pain.  Not on controller. - Given new prescription for colchicine with instructions to take 1.2 mg's afternoon followed by an additional 0.6 mg tonight and daily thereafter  Orders Placed This Encounter  Procedures  . For home use only DME Hospital bed    Order Specific Question:   Length of Need  Answer:   Lifetime    Order Specific Question:   The above medical condition requires:    Answer:   Patient requires the ability to reposition frequently    Order Specific Question:   Head must be elevated greater than:    Answer:   30 degrees    Order Specific Question:   Bed type    Answer:   Semi-electric   Meds ordered this encounter   Medications  . colchicine 0.6 MG tablet    Sig: Take 1 tablet (0.6 mg total) by mouth daily.    Dispense:  60 tablet    Refill:  0    Donna Butte, DO [place], PGY-3 04/15/2019, 11:20 AM

## 2019-04-15 NOTE — Progress Notes (Signed)
Patient last seen by Kathleen Argue in clinic    QUes of some edema She will need an in person visit to stratify prior to surgery

## 2019-04-15 NOTE — Assessment & Plan Note (Addendum)
Chronic.  Suboptimal control. - Advised to continue current antihypertensive regimen - May benefit from cardiology follow-up given patient's concern for worsening lower extremity swelling and need for cardiology clearance for possible right hip replacement in the future

## 2019-04-15 NOTE — Patient Instructions (Signed)
Thank you for coming in to see [name] today. Please see below to review our plan for today's visit.  1.  Take 2 tablets of colchicine this afternoon followed by an additional tablet before bedtime.  Continue taking 1 tablet of colchicine daily at least 48 hours after your flare resolves.  You may need to consider a prophylactic medication if you have a recurrence of your gout. 2.  I called in a order for a medical grade bed.  They will work on getting this set up for you.  Usually takes several weeks to arrange. 3.  Continue taking your blood pressure medications as instructed.  I will forward our visit to your cardiologist who can adjust your blood pressure medications.  Please call the clinic at [phone] if your symptoms worsen or you have any concerns. It was our pleasure to serve you.  Harriet Butte, [place], PGY-3

## 2019-04-18 DIAGNOSIS — M25551 Pain in right hip: Secondary | ICD-10-CM | POA: Diagnosis not present

## 2019-04-19 ENCOUNTER — Telehealth: Payer: Self-pay | Admitting: *Deleted

## 2019-04-19 NOTE — Telephone Encounter (Signed)
Attempted x 2 to reach patient. Rang numerous times and then disconnected each time. Unable to leave a message.

## 2019-04-19 NOTE — Telephone Encounter (Addendum)
-----   Message from Fay Records, MD sent at 04/15/2019  8:12 PM EDT ----- Patient last seen by Kathleen Argue in clinic    QUes of some edema She will need an in person visit to stratify prior to surgery  ----- Message ----- From: [place] Bing, DO Sent: 04/15/2019  11:22 AM EDT To: Fay Records, MD

## 2019-04-22 ENCOUNTER — Other Ambulatory Visit: Payer: Self-pay | Admitting: Orthopedic Surgery

## 2019-04-22 DIAGNOSIS — Z96641 Presence of right artificial hip joint: Secondary | ICD-10-CM

## 2019-04-24 ENCOUNTER — Encounter: Payer: Self-pay | Admitting: Physician Assistant

## 2019-04-24 ENCOUNTER — Other Ambulatory Visit: Payer: Self-pay | Admitting: Orthopedic Surgery

## 2019-04-24 ENCOUNTER — Ambulatory Visit (INDEPENDENT_AMBULATORY_CARE_PROVIDER_SITE_OTHER): Payer: Medicare Other | Admitting: Physician Assistant

## 2019-04-24 ENCOUNTER — Other Ambulatory Visit: Payer: Self-pay

## 2019-04-24 VITALS — BP 140/80 | HR 67 | Ht 66.0 in | Wt 190.0 lb

## 2019-04-24 DIAGNOSIS — Z01810 Encounter for preprocedural cardiovascular examination: Secondary | ICD-10-CM

## 2019-04-24 DIAGNOSIS — I110 Hypertensive heart disease with heart failure: Secondary | ICD-10-CM

## 2019-04-24 DIAGNOSIS — I5042 Chronic combined systolic (congestive) and diastolic (congestive) heart failure: Secondary | ICD-10-CM

## 2019-04-24 DIAGNOSIS — M25551 Pain in right hip: Secondary | ICD-10-CM

## 2019-04-24 MED ORDER — POTASSIUM CHLORIDE CRYS ER 20 MEQ PO TBCR: 20.0000 meq | EXTENDED_RELEASE_TABLET | Freq: Every day | ORAL | 3 refills | Status: DC

## 2019-04-24 MED ORDER — FUROSEMIDE 20 MG PO TABS: 20.0000 mg | ORAL_TABLET | Freq: Every day | ORAL | 3 refills | Status: DC

## 2019-04-24 NOTE — Progress Notes (Signed)
Cardiology Office Note:    Date:  04/24/2019   ID:  Donna Herrera, DOB Jan 06, 1946, MRN [id number]  PCP:  [place] Bing, DO  Cardiologist:  Dorris Carnes, MD  Electrophysiologist:  None   Referring MD: [place] Bing, DO   Chief Complaint  Patient presents with  . Surgical Clearance    Hip replacement, Dr. Gladstone Lighter, Dr. Alvan Dame  . Leg Swelling    History of Present Illness:    Donna Herrera is a 73 y.o. female with:  Chronic systolic CHF  Echo 0/94: EF 15-20; Echo 8/19: EF 40-45  Non-Ischemic CM  Tobacco use  Hypertension   Hyperlipidemia  Slipped Capital Femoral Epiphysis syndrome  S/p multiple hip procedures   [name] returns for surgical clearance.  She has a long hx of hip problems.  Her hip prosthesis is apparently degrading and she needs surgery.  She has a series of tests pending.  She feels that her volume status has been worse recently.  She has had fluctuations in her weight.  She has a nonproductive cough at times.  She denies fever.  She has not really felt that she is more short of breath.  She sleeps on an incline chronically.  She has not had paroxysmal nocturnal dyspnea.  She has had some swelling in her feet recently.  She denies syncope.  Prior CV studies:   The following studies were reviewed today:  Echo 06/06/18 Mod LVH, EF 40-45, diff HK, Gr 1 DD, MAC, trivial MR, mild LAE, trivial TR  Echo 03/06/2018 EF 15-20, diffuse HK, grade 2 diastolic dysfunction, trivial AI, MAC, trivial MR, moderate LAE, normal RVSF, mild TR, PASP 62  LHC 05/11/16 1. Normal coronary anatomy 2. Severe LV dysfunction with EF 30-35%. 3. Normal right heart and LV filling pressures.  Echo 10/05/15 Mild LVH, EF 35-40%, diffuse HK, grade 2 diastolic dysfunction, MAC, mild MR, moderate LAE, normal RVSF, PASP 49 mmHg  Echo 01/16/15 EF 40%, diffuse HK, mild MR, moderate LAE, mild RAE, PASP 42 mmHg  Past Medical History:  Diagnosis Date  .  Allergy   . Arthritis   . Candidiasis, mouth 10/19/2017  . Chronic combined systolic and diastolic CHF (congestive heart failure) ([place]) 10/21/2015   Echo 3/16: EF 40%, diffuse HK, mild MR, moderate LAE, mild RAE, PASP 42 mmHg  //  Echo 12/16: Mild LVH, EF 35-40%, diffuse HK, Gr 2 DD, MAC, mild MR, mod LAE, normal RVSF, PASP 49 mmHg // Echo 5/19: EF 15-20, diff HK, Gr 2 DD, trivial AI, MAC, trivial MR, mod LAE, normal RVSF, mild TR, PASP 62 // Limited Echo 8/19: mod LVH, EF 40-45, diff HK, Gr 1 DD, MAC, mild LAE   . Chronic left shoulder pain 04/10/2013  . Chronic lung disease   . Chronic pain   . Chronic pain syndrome 07/20/2017  . Depression 08/25/2016  . DJD (degenerative joint disease) 10/12/2012   Multiple joint replacements by Dr. Collier Salina.   . Dyshidrotic eczema 12/31/2014  . Essential hypertension 07/20/2017  . GERD (gastroesophageal reflux disease)   . Gout   . H/O slipped capital femoral epiphysis (SCFE)   . Hyperlipidemia   . Hypertension   . Hypertensive heart disease with CHF (congestive heart failure) ([place]) 10/12/2012  . Loss of taste 02/06/2018  . Mass of breast, left 07/21/2014  . NICM (nonischemic cardiomyopathy) ([place]) 06/09/2016   A. LHC 7/17: Normal coronary arteries, EF 30-35%, LVEDP 14 mmHg  . Pain in gums 04/18/2018  . Right hip pain  11/02/2012  . Tendonitis, Achilles, left 05/24/2017  . Tobacco abuse 09/29/2014  . Vitamin D deficiency 12/31/2014   Surgical Hx: The patient  has a past surgical history that includes Cesarean section; Tubal ligation; Total hip arthroplasty (1987); Joint replacement (1991, 1997); Hip fusion; Rotator cuff repair; Cardiac catheterization (2005); and Cardiac catheterization (N/A, 05/11/2016).   Current Medications: Current Meds  Medication Sig  . albuterol (PROVENTIL HFA;VENTOLIN HFA) 108 (90 Base) MCG/ACT inhaler Inhale 1-2 puffs into the lungs every 6 (six) hours as needed for wheezing.  [name] aspirin 81 MG tablet Take 1 tablet (81 mg total) by  mouth daily.  . baclofen (LIORESAL) 10 MG tablet Take 0.5 tablets (5 mg total) by mouth 2 (two) times daily as needed for muscle spasms.  . benzonatate (TESSALON) 100 MG capsule Take 1-2 capsules (100-200 mg total) by mouth 3 (three) times daily as needed for cough.  . Capsaicin-Menthol-Methyl Sal (CAPSAICIN-METHYL SAL-MENTHOL) 0.025-1-12 % CREA Apply 1 application topically 4 (four) times daily.  . carvedilol (COREG) 6.25 MG tablet Take 1 tablet (6.25 mg total) by mouth 2 (two) times daily.  . cetirizine (ZYRTEC) 10 MG tablet TAKE 1 TABLET (10 MG TOTAL) BY MOUTH DAILY.  [name] colchicine 0.6 MG tablet Take 1 tablet (0.6 mg total) by mouth daily.  [name] FLUoxetine (PROZAC) 20 MG tablet Take 1 tablet (20 mg total) by mouth daily.  [name] HYDROcodone-acetaminophen (NORCO/VICODIN) 5-325 MG tablet Take 1 tablet by mouth every 6 (six) hours as needed for moderate pain.  [name] oseltamivir (TAMIFLU) 75 MG capsule Take 1 capsule (75 mg total) by mouth every 12 (twelve) hours.  . pantoprazole (PROTONIX) 20 MG tablet TAKE 2 TABLETS BY MOUTH EVERY DAY  . potassium chloride SA (K-DUR) 20 MEQ tablet Take 1 tablet (20 mEq total) by mouth daily.  . sacubitril-valsartan (ENTRESTO) 24-26 MG Take 1 tablet by mouth 2 (two) times daily.  [name] spironolactone (ALDACTONE) 25 MG tablet TAKE 1 TABLET BY MOUTH EVERY DAY  . triamcinolone ointment (KENALOG) 0.5 % Apply 1 application topically 2 (two) times daily. To palms  . Vitamin D, Ergocalciferol, (DRISDOL) 50000 units CAPS capsule Take 1 capsule (50,000 Units total) by mouth every 7 (seven) days. After 8 weeks, take over-the-counter vitamin D 2000 units daily.  . [DISCONTINUED] furosemide (LASIX) 20 MG tablet TAKE 2 TABLETS ONCE DAILY  . [DISCONTINUED] potassium chloride SA (K-DUR,KLOR-CON) 20 MEQ tablet Take 20 mEq by mouth every other day.     Allergies:   Crab [shellfish allergy] and Morphine and related   Social History   Tobacco Use  . Smoking status: Former Smoker    Packs/day:  0.50  . Smokeless tobacco: Current User  . Tobacco comment: decreased smoking/1 pack every 3 days  Substance Use Topics  . Alcohol use: Yes    Comment: on holidays  . Drug use: No     Family Hx: The patient's family history includes Cancer in her father; Depression in her mother; Diabetes in her mother and sister; Heart disease in her mother; Hypertension in her mother; Stroke in her mother. There is no history of Breast cancer.  ROS:   Please see the history of present illness.    ROS All other systems reviewed and are negative.   EKGs/Labs/Other Test Reviewed:    EKG:  EKG is  ordered today.  The ekg ordered today demonstrates sinus rhythm, heart rate 66, left axis deviation, nonspecific ST-T wave changes, QTC 492, similar to prior tracings  Recent Labs: 08/31/2018: NT-Pro BNP  309 11/08/2018: BUN 15; Creatinine, Ser 0.86; Potassium 4.9; Sodium 138 04/02/2019: Hemoglobin 11.9   Recent Lipid Panel Lab Results  Component Value Date/Time   CHOL 177 10/04/2015 01:41 AM   TRIG 78 10/04/2015 01:41 AM   HDL 50 10/04/2015 01:41 AM   CHOLHDL 3.5 10/04/2015 01:41 AM   LDLCALC 111 (H) 10/04/2015 01:41 AM    Physical Exam:    VS:  BP 140/80   Pulse 67   Ht _0  (1.676 m)   Wt 190 lb (86.2 kg)   BMI 30.67 kg/m     Wt Readings from Last 3 Encounters:  04/24/19 190 lb (86.2 kg)  04/15/19 191 lb 9.6 oz (86.9 kg)  01/14/19 187 lb (84.8 kg)     Physical Exam  Constitutional: She is oriented to person, place, and time. She appears well-developed and well-nourished. No distress.  HENT:  Head: Normocephalic and atraumatic.  Eyes: No scleral icterus.  Neck: JVD (JVP 6-7 cm) present. No thyromegaly present.  Cardiovascular: Normal rate, regular rhythm and normal heart sounds.  No murmur heard. Pulmonary/Chest: Effort normal and breath sounds normal. She has no rales.  Abdominal: Soft. There is no hepatomegaly.  Musculoskeletal:        General: Edema (trace-1+ bilat ankle edema)  present.  Lymphadenopathy:    She has no cervical adenopathy.  Neurological: She is alert and oriented to person, place, and time.  Skin: Skin is warm and dry.  Psychiatric: She has a normal mood and affect.    ASSESSMENT & PLAN:    1. Preoperative cardiovascular examination She has a history of normal coronary arteries by catheterization in 2017.  Her perioperative risk of major cardiac event is low at 0.9% according to the revised cardiac risk index.  She does not require further testing for risk stratification.  However, she seems to have more evidence of volume excess.  Her diuretics will be adjusted and she will be brought back in close follow-up prior to final clearance.  2. Chronic combined systolic and diastolic CHF (congestive heart failure) (HCC) EF has improved to 40-45 by most recent echocardiogram.  She does seem to have evidence of volume excess on exam.  She is NYHA 2-2 b.  She does admit to increased salt in her diet.  She has been eating out more.  I have asked her to decrease her salt intake.  I will also increase her Lasix to 20 mg every day.  Increase potassium along with this at 20 mEq daily.  Obtain a follow-up BMET, BNP in 1 week.  I will see her back in 2 weeks.  3. Hypertensive heart disease with chronic combined systolic and diastolic congestive heart failure (HCC) Blood pressure somewhat elevated.  Adjust Lasix as noted.  We could increase her Entresto if needed.  Chronic combined systolic and diastolic CHF (congestive heart failure) (HCC) EF 40-45.  NYHA 2.  Volume stable.  Continue current Rx which includes beta-blocker, angiotensin receptor neprilysin inhibitor, mineralocorticoid receptor antagonist.    Hypertensive heart disease with chronic combined systolic and diastolic congestive heart failure ([place]) The patient's blood pressure is controlled on her current regimen.  Continue current therapy.    Dispo:  Return in about 2 weeks (around 05/08/2019) for Close  Follow Up, w/ [name], PA-C.   Medication Adjustments/Labs and Tests Ordered: Current medicines are reviewed at length with the patient today.  Concerns regarding medicines are outlined above.  Tests Ordered: Orders Placed This Encounter  Procedures  . Basic  metabolic panel  . Pro b natriuretic peptide (BNP)  . EKG 12-Lead   Medication Changes: Meds ordered this encounter  Medications  . potassium chloride SA (K-DUR) 20 MEQ tablet    Sig: Take 1 tablet (20 mEq total) by mouth daily.    Dispense:  90 tablet    Refill:  3  . furosemide (LASIX) 20 MG tablet    Sig: Take 1 tablet (20 mg total) by mouth daily.    Dispense:  90 tablet    Refill:  3    Signed, [name], PA-C  04/24/2019 5:42 PM    [facility] [place], [place],   [address] Phone: [phone]; Fax: [phone]

## 2019-04-24 NOTE — Telephone Encounter (Signed)
Patient has been scheduled 6/24 with APP.

## 2019-04-24 NOTE — Patient Instructions (Addendum)
Medication Instructions:   Start taking Lasix 20 mg once day   Start Potassium 20 meq once day   If you need a refill on your cardiac medications before your next appointment, please call your pharmacy.   Lab work: BMET AND BNP TODAY  in one week   If you have labs (blood work) drawn today and your tests are completely normal, you will receive your results only by: [name] MyChart Message (if you have MyChart) OR . A paper copy in the mail If you have any lab test that is abnormal or we need to change your treatment, we will call you to review the results.  Testing/Procedures: NONE ORDERED  TODAY   Follow-Up: 2  weeks follow up with Donna Herrera   Any Other Special Instructions Will Be Listed Below (If Applicable).

## 2019-04-26 ENCOUNTER — Ambulatory Visit (INDEPENDENT_AMBULATORY_CARE_PROVIDER_SITE_OTHER): Payer: Medicare Other | Admitting: Family Medicine

## 2019-04-26 ENCOUNTER — Other Ambulatory Visit: Payer: Self-pay

## 2019-04-26 DIAGNOSIS — R54 Age-related physical debility: Secondary | ICD-10-CM | POA: Diagnosis not present

## 2019-04-26 DIAGNOSIS — I5042 Chronic combined systolic (congestive) and diastolic (congestive) heart failure: Secondary | ICD-10-CM

## 2019-04-26 DIAGNOSIS — I110 Hypertensive heart disease with heart failure: Secondary | ICD-10-CM

## 2019-04-26 NOTE — Patient Instructions (Signed)
Thank you for coming in to see [name] today. Please see below to review our plan for today's visit.  1.  Continue taking her Lasix daily and follow-up with your cardiologist to obtain blood work to check your kidneys. 2.  I will work on getting your hospital bed set up today. 3.  Follow-up after your visit with the cardiologist.  Please call the clinic at [phone] if your symptoms worsen or you have any concerns. It was our pleasure to serve you.  Harriet Butte, [place], PGY-3

## 2019-04-26 NOTE — Assessment & Plan Note (Addendum)
Likely source of acute on chronic lower extremity edema with right greater than left.  Followed by cardiology with upcoming follow-up to reassess renal function since increasing diuretic.  Patient seems to be tolerating increased diuresis. - Reviewed return precautions - Continue Lasix 20 mg daily along with other heart regimen

## 2019-04-26 NOTE — Assessment & Plan Note (Signed)
Requires hospital bed due to difficulty ambulating and fragile state complicated by acute on chronic right hip pain requiring replacement. - We will follow-up DME order 04/15/2019

## 2019-04-26 NOTE — Progress Notes (Signed)
   Subjective   Patient ID: Donna Herrera    DOB: [birth year], 73 y.o. female   MRN: [id number]  CC: "Follow-up"  HPI: Donna Herrera is a 73 y.o. female who presents to clinic today for the following:  Lower extremity edema: [name] has a history of HFrEF with improved EF of 40-45% as of August 2019.  She recently saw her cardiologist who increased her Lasix from 20 mg every other day back to 20 mg daily.  She has an upcoming appointment to have her kidneys checked.  She is also on several other heart medications including carvedilol, Spironolactone and Entresto, and requires potassium replacement.  She feels that her lower extremities have improved and she tries to keep her legs elevated as instructed.  She denies any shortness of breath or chest pain.  Fragility and acute on chronic right hip pain: Patient here today to follow-up need for hospital bed.  As mentioned at her last visit back on 04/15/2019, patient is currently requiring a revision of her total right hip.  She is currently working with her orthopedic surgeon and cardiologist to arrange for likely surgery.  Currently requires opioids for pain control and reports good control as of recently.  She is very frail and has a scheduled DEXA scan in the coming weeks.  She does use crutches to ambulate.  She does have difficulty getting in and out of bed especially with her daughter being her sole caregiver.  Due to her fragility, patient requires frequent changes in position and has a medical condition which requires positioning of the body in ways not feasible with ordinary beds.  She will require this for the rest of her life.  ROS: see HPI for pertinent.  Donna Herrera: Reviewed. Smoking status reviewed. Medications reviewed.  Objective   BP 119/60   Pulse 68   SpO2 98%  Vitals and nursing note reviewed.  General: frail elderly female using crutches to ambulate, NAD with non-toxic appearance HEENT: normocephalic,  atraumatic, moist mucous membranes Neck: supple, non-tender without lymphadenopathy, absent JVD Cardiovascular: regular rate and rhythm without murmurs, rubs, or gallops Lungs: clear to auscultation bilaterally with normal work of breathing Skin: warm, dry, no rashes or lesions, cap refill < 2 seconds Extremities: warm and well perfused, normal tone, trace pitting edema bilaterally up to mid shin on left and knee on right, 5/5 motor strength on all extremities with exception to right lower extremity which is limited due to tenderness of right hip, neurovascular intact  Assessment & Plan   Hypertensive heart disease with CHF (congestive heart failure) (HCC) Likely source of acute on chronic lower extremity edema with right greater than left.  Followed by cardiology with upcoming follow-up to reassess renal function since increasing diuretic.  Patient seems to be tolerating increased diuresis. - Reviewed return precautions - Continue Lasix 20 mg daily along with other heart regimen  Frail elderly Requires hospital bed due to difficulty ambulating and fragile state complicated by acute on chronic right hip pain requiring replacement. - We will follow-up DME order 04/15/2019  No orders of the defined types were placed in this encounter.  No orders of the defined types were placed in this encounter.   Donna Herrera, [place], PGY-3 04/26/2019, 2:28 PM

## 2019-04-29 ENCOUNTER — Telehealth: Payer: Self-pay | Admitting: *Deleted

## 2019-04-29 NOTE — Telephone Encounter (Signed)
Dr. Yisroel Ramming,  I dont see where we inform [facility] of orders.  I have sent a community message to [facility] to process. Christen Bame, CMA

## 2019-04-29 NOTE — Telephone Encounter (Signed)
-----   Message from [place] Bing, DO sent at 04/26/2019  2:29 PM EDT ----- Regarding: DME Hospital Bed Patient seen today asking about hospital bed.  Has not heard back about receiving her equipment.  I did place a DME order back on 04/15/2019.  Documented note on 04/26/2019 with criteria to receive bed.  Please advise.  Harriet Butte, [place], PGY-3

## 2019-04-30 ENCOUNTER — Encounter (HOSPITAL_COMMUNITY)
Admission: RE | Admit: 2019-04-30 | Discharge: 2019-04-30 | Disposition: A | Discharge status: Home / Self Care | Payer: Medicare Other | Source: Ambulatory Visit | Attending: Orthopedic Surgery | Admitting: Orthopedic Surgery

## 2019-04-30 ENCOUNTER — Other Ambulatory Visit: Payer: Self-pay

## 2019-04-30 DIAGNOSIS — M79651 Pain in right thigh: Secondary | ICD-10-CM | POA: Diagnosis not present

## 2019-04-30 DIAGNOSIS — M25551 Pain in right hip: Secondary | ICD-10-CM | POA: Diagnosis not present

## 2019-04-30 DIAGNOSIS — Z96641 Presence of right artificial hip joint: Secondary | ICD-10-CM | POA: Insufficient documentation

## 2019-04-30 MED ORDER — TECHNETIUM TC 99M MEDRONATE IV KIT
21.3000 | PACK | Freq: Once | INTRAVENOUS | Status: AC | PRN
  Administered 2019-04-30: 21.3 via INTRAVENOUS

## 2019-04-30 NOTE — Telephone Encounter (Signed)
Order received and will be processed by [facility]. Christen Bame, CMA

## 2019-05-03 DIAGNOSIS — G894 Chronic pain syndrome: Secondary | ICD-10-CM | POA: Diagnosis not present

## 2019-05-03 DIAGNOSIS — M25551 Pain in right hip: Secondary | ICD-10-CM | POA: Diagnosis not present

## 2019-05-03 DIAGNOSIS — R54 Age-related physical debility: Secondary | ICD-10-CM | POA: Diagnosis not present

## 2019-05-03 DIAGNOSIS — Z96641 Presence of right artificial hip joint: Secondary | ICD-10-CM | POA: Diagnosis not present

## 2019-05-03 DIAGNOSIS — I428 Other cardiomyopathies: Secondary | ICD-10-CM | POA: Diagnosis not present

## 2019-05-13 ENCOUNTER — Ambulatory Visit
Admission: RE | Admit: 2019-05-13 | Discharge: 2019-05-13 | Disposition: A | Discharge status: Home / Self Care | Payer: Medicare Other | Source: Ambulatory Visit | Attending: Orthopedic Surgery | Admitting: Orthopedic Surgery

## 2019-05-13 ENCOUNTER — Other Ambulatory Visit: Payer: Self-pay

## 2019-05-13 DIAGNOSIS — M25551 Pain in right hip: Secondary | ICD-10-CM

## 2019-05-13 DIAGNOSIS — Z96641 Presence of right artificial hip joint: Secondary | ICD-10-CM | POA: Diagnosis not present

## 2019-05-18 LAB — SYNOVIAL CELL COUNT + DIFF, W/ CRYSTALS
Basophils, %: 0 %
Eosinophils-Synovial: 2 % (ref 0–2)
Lymphocytes-Synovial Fld: 24 % (ref 0–74)
Monocyte/Macrophage: 21 % (ref 0–69)
Neutrophil, Synovial: 53 % — ABNORMAL HIGH (ref 0–24)
Synoviocytes, %: 0 % (ref 0–15)
WBC, Synovial: 3753 {cells}/uL — ABNORMAL HIGH

## 2019-05-18 LAB — ANAEROBIC AND AEROBIC CULTURE
AER RESULT:: NO GROWTH
MICRO NUMBER:: 660115
MICRO NUMBER:: 660116
SPECIMEN QUALITY:: ADEQUATE
SPECIMEN QUALITY:: ADEQUATE

## 2019-05-30 DIAGNOSIS — T8484XD Pain due to internal orthopedic prosthetic devices, implants and grafts, subsequent encounter: Secondary | ICD-10-CM | POA: Diagnosis not present

## 2019-05-30 DIAGNOSIS — M25551 Pain in right hip: Secondary | ICD-10-CM | POA: Diagnosis not present

## 2019-06-05 ENCOUNTER — Other Ambulatory Visit: Payer: Self-pay | Admitting: *Deleted

## 2019-06-05 DIAGNOSIS — G894 Chronic pain syndrome: Secondary | ICD-10-CM

## 2019-06-12 ENCOUNTER — Ambulatory Visit (INDEPENDENT_AMBULATORY_CARE_PROVIDER_SITE_OTHER): Payer: Medicare Other | Admitting: Family Medicine

## 2019-06-12 ENCOUNTER — Other Ambulatory Visit: Payer: Self-pay

## 2019-06-12 VITALS — BP 125/70 | HR 65

## 2019-06-12 DIAGNOSIS — M25551 Pain in right hip: Secondary | ICD-10-CM

## 2019-06-12 DIAGNOSIS — I110 Hypertensive heart disease with heart failure: Secondary | ICD-10-CM | POA: Diagnosis not present

## 2019-06-12 DIAGNOSIS — I5042 Chronic combined systolic (congestive) and diastolic (congestive) heart failure: Secondary | ICD-10-CM

## 2019-06-12 DIAGNOSIS — G894 Chronic pain syndrome: Secondary | ICD-10-CM | POA: Diagnosis not present

## 2019-06-12 DIAGNOSIS — G8929 Other chronic pain: Secondary | ICD-10-CM

## 2019-06-12 MED ORDER — HYDROCODONE-ACETAMINOPHEN 5-325 MG PO TABS: 1.0000 | ORAL_TABLET | Freq: Four times a day (QID) | ORAL | 0 refills | Status: DC | PRN

## 2019-06-12 NOTE — Assessment & Plan Note (Addendum)
Patient complaining of back and hip pain. Exam limited due to frailty and pain. She was tender along SI joint, piriformis muscle, and hip joint with any ROM. Explained to patient that I believe her pain may be stemming from more than just her hip, including her back and SI joint as well. Believe imaging of lumbar spine is warranted for further evaluation. She declined initially, but eventually agreed. She may benefit from ICS pending imaging results. Patient continuously declared that she was just here for her pain medicines. She noted she is not interested in "a bunch of needles to her back". She was very frustrated throughout entire encounter. Reassured patient that I seek only to provide the best care for her and I believe that further evaluation for other causes of pain is important as treatment of those could provide relief as her current pain appears to be quite debilitating to her quality of life. She continued to remain frustrated. Discussed pain management options given her age and risk for fall. Patient refused Tramadol as this has provided no improvement in the past. Patient has been using Norco chronically and on an as needed bases. PDMP aware reviewed and no red flags. - obtain lumbar x-ray  - consider referral to spine surgeon pending imaging results - referral to PT as believe deconditioning may be playing part in her pain - Recommended OTC Tylenol, Voltaren gel, and Icey-Hot with Lidocaine patches  - Avoid NSAIDs and Goody Powder  - Hydrocodone-Acetaminophen 5-325  #30 with no refills - Referral to pain specialist if patient continues to require chronic pain management  - RTC 4-6 weeks, or sooner if needed

## 2019-06-12 NOTE — Progress Notes (Deleted)
   Subjective:   Patient ID: Donna Herrera    DOB: 1946-08-23, 73 y.o. female   MRN: [id number]  Donna Herrera is a 73 y.o. female with a history of chronic right hip pain here for refill of pain medicines.  Chronic pain following right total hip arthroplasty (0355): Patient is followed by orthopedic Dr. Alvan Dame. Last seen on 05/13/2019. Per chart review, unclear etiology of pain. Imaging revealed significant osteoporosis with "revision components in place. She has obvious failure of acetabular component". She had right hip arthroplasty and aspiration to further evaluate for septic joint. No growth found. She was treated with keflex x 6 weeks. He is reluctant to do revision surgery given the extent of her osteoporosis. She has follow up appointment on 07/11/2019. She is here today for pain meds. She has treated her pain with OTC topical analgesics. She has been using Tylenol and Goodies powder. She has used Norco 5-325mg . She notes she would need 1-2 pills a day (0.5 pills at a time) and this would control her pain. She notes she has tried tramadol in the past and has no had any improvement with this.  Lower extremity edema  H/o HFrEF (EF 40-45%):  Donna Herrera has a history of HFrEF with improved EF of 40-45% as of August 2019.  She recently saw her cardiologist who increased her Lasix from 20 mg every other day back to 20 mg daily, however she is only taking it QOD as she got leg cramps.  She is also on several other heart medications including carvedilol, Spironolactone and Entresto, and requires potassium replacement.  She feels that her lower extremities have improved and she tries to keep her legs elevated as instructed.  She denies any shortness of breath or chest pain. She would like to get her kidney's checked today.  Review of Systems:  Per HPI.   [place], medications and smoking status reviewed.  Objective:   There were no vitals taken for this visit. Vitals and nursing note  reviewed.  General: well nourished, well developed, in no acute distress with non-toxic appearance HEENT: normocephalic, atraumatic, moist mucous membranes Neck: supple, non-tender without lymphadenopathy CV: regular rate and rhythm without murmurs, rubs, or gallops, no lower extremity edema Lungs: clear to auscultation bilaterally with normal work of breathing Abdomen: soft, non-tender, non-distended, no masses or organomegaly palpable, normoactive bowel sounds Skin: warm, dry, no rashes or lesions Extremities: warm and well perfused, normal tone MSK: ROM grossly intact, strength intact, gait normal Neuro: Alert and oriented, speech normal    Assessment & Plan:   No problem-specific Assessment & Plan notes found for this encounter.  No orders of the defined types were placed in this encounter.  No orders of the defined types were placed in this encounter.   Mina Marble, DO PGY-2, [facility] 06/12/2019 9:19 AM

## 2019-06-12 NOTE — Assessment & Plan Note (Addendum)
Patient is s/p total right hip arthroplasty in 1980's. She is followed by Dr. Milagros Reap at ortho clinic. Recent aspiration of joint negative for infection. She is currently on a month long trial of Keflex with scheduled follow up on 07/11/19. No improvement in pain. On my exam, appears pain my be multifactorial including her right hip, right SI joint, and lumbar spine. She has a history of DJD which is likely contributing. She may benefit from ICS of lumbar spine or SI joint pending what x-ray shows and if patient would agree.  - plan as above

## 2019-06-12 NOTE — Assessment & Plan Note (Signed)
Well controlled on current regimen including Lasix 20mg  QOD, Carvedilol, Spironolactone, and Entresto with daily potassium replacement. BP normotensive today. No LE edema appreciated today. Asymptomatic. Followed by cardiology - continue current regimen - CMP to evaluate kidney function  - follow up with cardiology as scheduled

## 2019-06-12 NOTE — Progress Notes (Signed)
Subjective:   Patient ID: Donna Herrera    DOB: 06-Jun-1946, 73 y.o. female   MRN: [id number]  Donna Herrera is a 73 y.o. female with a history of chronic right hip pain here for refill of pain medicines.  Chronic pain following right total hip arthroplasty (2831): Patient is followed by orthopedic Dr. Alvan Dame. Last seen on 05/13/2019. Per chart review, unclear etiology of pain. Imaging revealed significant osteoporosis with "revision components in place. She has obvious failure of acetabular component". She had right hip arthroplasty and aspiration to further evaluate for septic joint. No growth found. She was treated with keflex x 6 weeks. He is reluctant to do revision surgery given the extent of her osteoporosis. She has follow up appointment on 07/11/2019. She is here today for pain meds. She has treated her pain with OTC topical analgesics. She has been using Tylenol and Goodies powder. She has used Norco 5-325mg . She notes she would need 1-2 pills a day (0.5 pills at a time) and this would control her pain. She notes she has tried tramadol in the past and has no had any improvement with this.  H/o HFrEF (EF 40-45%):  [name] has a history of HFrEF with improved EF of 40-45% as of August 2019.  She recently saw her cardiologist who increased her Lasix from 20 mg every other day back to 20 mg daily, however she is only taking it QOD as she got leg cramps.  She is also on several other heart medications including carvedilol, Spironolactone and Entresto, and requires potassium replacement.  She feels that her lower extremities have improved and she tries to keep her legs elevated as instructed.  She denies any shortness of breath or chest pain. She would like to get her kidney's checked today.  Review of Systems:  Per HPI.   [place], medications and smoking status reviewed.  Objective:   BP 125/70    Pulse 65    SpO2 96%  Vitals and nursing note reviewed.  General: well  nourished, well developed, in no acute distress with non-toxic appearance HEENT: normocephalic, atraumatic, moist mucous membranes Neck: supple, non-tender without lymphadenopathy CV: regular rate and rhythm without murmurs, rubs, or gallops, no lower extremity edema Lungs: clear to auscultation bilaterally with normal work of breathing Abdomen: soft, non-tender, non-distended, no masses or organomegaly palpable, normoactive bowel sounds Skin: warm, dry, no rashes or lesions Extremities: warm and well perfused, normal tone MSK: ROM grossly intact, strength intact, gait normal Neuro: Alert and oriented, speech normal  Lumbar spine: limited due to pain - Inspection: no gross deformity or asymmetry, swelling or ecchymosis - Palpation: No TTP over the spinous processes, paraspinal muscles. Very tender to palpation along right SI joint  - ROM: ROM difficult to assess due to pain  - Strength: difficult to assess due to pain  - Neuro: sensation intact in the L4-S1 nerve root distribution b/l - Special Tests: patient refused special tests   Hip, Right: Limited due to pain - Inspection: No gross deformity, no swelling, erythema, or ecchymosis - Palpation: TTP at groin and anterior thigh, no pain over greater trochanter  - ROM: Severe pain with all ROM  - Strength: No assessed due to pain  - Neuro/vasc: NV intact distally - Special Tests: Pain with FABER and FADIR.  Patient refused any further special tests  Assessment & Plan:   Encounter for chronic pain management Patient complaining of back and hip pain. Exam limited due to frailty and pain. She was  tender along SI joint, piriformis muscle, and hip joint with any ROM. Explained to patient that I believe her pain may be stemming from more than just her hip, including her back and SI joint as well. Believe imaging of lumbar spine is warranted for further evaluation. She declined initially, but eventually agreed. She may benefit from ICS pending  imaging results. Patient continuously declared that she was just here for her pain medicines. She noted she is not interested in "a bunch of needles to her back". She was very frustrated throughout entire encounter. Reassured patient that I seek only to provide the best care for her and I believe that further evaluation for other causes of pain is important as treatment of those could provide relief as her current pain appears to be quite debilitating to her quality of life. She continued to remain frustrated. Discussed pain management options given her age and risk for fall. Patient refused Tramadol as this has provided no improvement in the past. Patient has been using Norco chronically and on an as needed bases. PDMP aware reviewed and no red flags. - obtain lumbar x-ray  - consider referral to spine surgeon pending imaging results - referral to PT as believe deconditioning may be playing part in her pain - Recommended OTC Tylenol, Voltaren gel, and Icey-Hot with Lidocaine patches  - Avoid NSAIDs and Goody Powder  - Hydrocodone-Acetaminophen 5-325  #30 with no refills - Referral to pain specialist if patient continues to require chronic pain management  - RTC 4-6 weeks, or sooner if needed  Right hip pain Patient is s/p total right hip arthroplasty in 1980's. She is followed by Dr. Milagros Reap at ortho clinic. Recent aspiration of joint negative for infection. She is currently on a month long trial of Keflex with scheduled follow up on 07/11/19. No improvement in pain. On my exam, appears pain my be multifactorial including her right hip, right SI joint, and lumbar spine. She has a history of DJD which is likely contributing. She may benefit from ICS of lumbar spine or SI joint pending what x-ray shows and if patient would agree.  - plan as above   Hypertensive heart disease with CHF (congestive heart failure) ([place]) Well controlled on current regimen including Lasix 20mg  QOD, Carvedilol, Spironolactone,  and Entresto with daily potassium replacement. BP normotensive today. No LE edema appreciated today. Asymptomatic. Followed by cardiology - continue current regimen - CMP to evaluate kidney function  - follow up with cardiology as scheduled  Orders Placed This Encounter  Procedures   DG Lumbar Spine Complete    Standing Status:   Future    Standing Expiration Date:   08/11/2020    Order Specific Question:   Reason for Exam (SYMPTOM  OR DIAGNOSIS REQUIRED)    Answer:   Right hip and back pain    Order Specific Question:   Preferred imaging location?    Answer:   [facility]    Order Specific Question:   Radiology Contrast Protocol - do NOT remove file path    Answer:   \[email or web address].pdf   Comprehensive metabolic panel   Ambulatory referral to Physical Therapy    Referral Priority:   Routine    Referral Type:   Physical Medicine    Referral Reason:   Specialty Services Required    Requested Specialty:   Physical Therapy    Number of Visits Requested:   1   Meds ordered this encounter  Medications   HYDROcodone-acetaminophen (NORCO/VICODIN) 5-325 MG  tablet    Sig: Take 1 tablet by mouth every 6 (six) hours as needed for moderate pain.    Dispense:  30 tablet    Refill:  0    Mina Marble, DO PGY-2, [facility] 06/13/2019 9:29 PM

## 2019-06-13 ENCOUNTER — Encounter: Payer: Self-pay | Admitting: Family Medicine

## 2019-06-13 LAB — COMPREHENSIVE METABOLIC PANEL
ALT: 10 IU/L (ref 0–32)
AST: 14 IU/L (ref 0–40)
Albumin/Globulin Ratio: 1.3 (ref 1.2–2.2)
Albumin: 4.3 g/dL (ref 3.7–4.7)
Alkaline Phosphatase: 114 IU/L (ref 39–117)
BUN/Creatinine Ratio: 19 (ref 12–28)
BUN: 15 mg/dL (ref 8–27)
Bilirubin Total: 0.3 mg/dL (ref 0.0–1.2)
CO2: 22 mmol/L (ref 20–29)
Calcium: 9 mg/dL (ref 8.7–10.3)
Chloride: 100 mmol/L (ref 96–106)
Creatinine, Ser: 0.81 mg/dL (ref 0.57–1.00)
GFR calc Af Amer: 83 mL/min/{1.73_m2} (ref >59)
GFR calc non Af Amer: 72 mL/min/{1.73_m2} (ref >59)
Globulin, Total: 3.2 g/dL (ref 1.5–4.5)
Glucose: 106 mg/dL — ABNORMAL HIGH (ref 65–99)
Potassium: 4.2 mmol/L (ref 3.5–5.2)
Sodium: 138 mmol/L (ref 134–144)
Total Protein: 7.5 g/dL (ref 6.0–8.5)

## 2019-06-17 ENCOUNTER — Telehealth: Payer: Self-pay | Admitting: Interventional Cardiology

## 2019-06-17 NOTE — Telephone Encounter (Signed)
Spoke with patient. I offered her an in office appointment but she would need to arrange transportation.  She could possibly do a telephone visit. She offered that she had blood work done at PCP last Wednesday.  (CMET) Creatinine and potassium are wnl.  EPIC  On Friday she started taking lasix only every other day due to having some cramping in her legs.  Has not noticed any cramping in several days.  Adv her to go back to lasix 20 mg daily, adv if develops SOB or weight does not improve she should call us back.  adv I would forward to Dr. Harrington Challenger to review and if she has any other recommendations we would call patient back.  Pt is in agreement with this plan.

## 2019-06-17 NOTE — Telephone Encounter (Signed)
  Olivia from [facility] is calling to notify us that the patient has had a 3 pound weight gain since yesterday. She states that the patient has changed taking her lasix every day to taking it every other day because she was experiencing some cramps.

## 2019-06-19 ENCOUNTER — Ambulatory Visit
Admission: RE | Admit: 2019-06-19 | Discharge: 2019-06-19 | Disposition: A | Discharge status: Home / Self Care | Payer: Medicare Other | Source: Ambulatory Visit | Attending: Family Medicine | Admitting: Family Medicine

## 2019-06-19 ENCOUNTER — Other Ambulatory Visit: Payer: Self-pay

## 2019-06-19 DIAGNOSIS — M25551 Pain in right hip: Secondary | ICD-10-CM

## 2019-06-19 DIAGNOSIS — G894 Chronic pain syndrome: Secondary | ICD-10-CM

## 2019-06-24 ENCOUNTER — Telehealth: Payer: Self-pay | Admitting: Internal Medicine

## 2019-06-24 DIAGNOSIS — I5042 Chronic combined systolic (congestive) and diastolic (congestive) heart failure: Secondary | ICD-10-CM

## 2019-06-24 NOTE — Telephone Encounter (Signed)
Spoke with patient. She is in agreement to come for lab work in 3 weeks.  Scheduled. Aware to call if weights increase or breathing becomes short/labored.

## 2019-06-24 NOTE — Telephone Encounter (Signed)
Agree with plan to take lasix   Patient should have BMET and BNP and Magnesium in 3 wks    If wt continues to climb or breathing becomes short should be seen in clinic

## 2019-06-27 ENCOUNTER — Other Ambulatory Visit: Payer: Self-pay | Admitting: *Deleted

## 2019-06-27 MED ORDER — SPIRONOLACTONE 25 MG PO TABS: 25.0000 mg | ORAL_TABLET | Freq: Every day | ORAL | 2 refills | Status: DC

## 2019-07-05 ENCOUNTER — Other Ambulatory Visit: Payer: Self-pay

## 2019-07-05 DIAGNOSIS — I428 Other cardiomyopathies: Secondary | ICD-10-CM

## 2019-07-05 DIAGNOSIS — I5042 Chronic combined systolic (congestive) and diastolic (congestive) heart failure: Secondary | ICD-10-CM

## 2019-07-05 MED ORDER — CARVEDILOL 6.25 MG PO TABS: 6.2500 mg | ORAL_TABLET | Freq: Two times a day (BID) | ORAL | 5 refills | Status: DC

## 2019-07-16 ENCOUNTER — Other Ambulatory Visit: Payer: Self-pay

## 2019-07-16 ENCOUNTER — Encounter (INDEPENDENT_AMBULATORY_CARE_PROVIDER_SITE_OTHER): Payer: Self-pay

## 2019-07-16 ENCOUNTER — Other Ambulatory Visit: Payer: Medicare Other

## 2019-07-16 DIAGNOSIS — I5042 Chronic combined systolic (congestive) and diastolic (congestive) heart failure: Secondary | ICD-10-CM

## 2019-07-16 LAB — BASIC METABOLIC PANEL
BUN/Creatinine Ratio: 17 (ref 12–28)
BUN: 14 mg/dL (ref 8–27)
CO2: 20 mmol/L (ref 20–29)
Calcium: 9.1 mg/dL (ref 8.7–10.3)
Chloride: 103 mmol/L (ref 96–106)
Creatinine, Ser: 0.84 mg/dL (ref 0.57–1.00)
GFR calc Af Amer: 80 mL/min/{1.73_m2} (ref >59)
GFR calc non Af Amer: 69 mL/min/{1.73_m2} (ref >59)
Glucose: 110 mg/dL — ABNORMAL HIGH (ref 65–99)
Potassium: 3.9 mmol/L (ref 3.5–5.2)
Sodium: 138 mmol/L (ref 134–144)

## 2019-07-16 LAB — MAGNESIUM: Magnesium: 2.1 mg/dL (ref 1.6–2.3)

## 2019-07-16 LAB — PRO B NATRIURETIC PEPTIDE: NT-Pro BNP: 164 pg/mL (ref 0–301)

## 2019-07-18 ENCOUNTER — Telehealth: Payer: Self-pay

## 2019-07-18 NOTE — Telephone Encounter (Signed)
The patient has been notified of the result and verbalized understanding.  All questions (if any) were answered. Wilma Flavin, RN 07/18/2019 2:39 PM

## 2019-07-24 ENCOUNTER — Other Ambulatory Visit: Payer: Self-pay

## 2019-07-24 DIAGNOSIS — I428 Other cardiomyopathies: Secondary | ICD-10-CM

## 2019-07-24 DIAGNOSIS — G894 Chronic pain syndrome: Secondary | ICD-10-CM

## 2019-07-24 DIAGNOSIS — I5042 Chronic combined systolic (congestive) and diastolic (congestive) heart failure: Secondary | ICD-10-CM

## 2019-07-24 DIAGNOSIS — M1A09X0 Idiopathic chronic gout, multiple sites, without tophus (tophi): Secondary | ICD-10-CM

## 2019-07-29 NOTE — Telephone Encounter (Signed)
Pt calling to check status. Nea Gittens, CMA  

## 2019-07-30 ENCOUNTER — Other Ambulatory Visit: Payer: Self-pay | Admitting: Family Medicine

## 2019-07-30 DIAGNOSIS — G894 Chronic pain syndrome: Secondary | ICD-10-CM

## 2019-07-30 MED ORDER — CARVEDILOL 6.25 MG PO TABS: 6.2500 mg | ORAL_TABLET | Freq: Two times a day (BID) | ORAL | 5 refills | Status: DC

## 2019-07-30 MED ORDER — HYDROCODONE-ACETAMINOPHEN 5-325 MG PO TABS: 1.0000 | ORAL_TABLET | Freq: Four times a day (QID) | ORAL | 0 refills | Status: DC | PRN

## 2019-07-30 MED ORDER — COLCHICINE 0.6 MG PO TABS: 0.6000 mg | ORAL_TABLET | Freq: Every day | ORAL | 2 refills | Status: DC

## 2019-07-30 MED ORDER — BACLOFEN 10 MG PO TABS: 10.0000 mg | ORAL_TABLET | Freq: Three times a day (TID) | ORAL | 3 refills | Status: DC

## 2019-07-30 MED ORDER — SPIRONOLACTONE 25 MG PO TABS: 25.0000 mg | ORAL_TABLET | Freq: Every day | ORAL | 2 refills | Status: DC

## 2019-07-30 NOTE — Progress Notes (Signed)
Encounter for Chronic Pain Management: Patient is s/p right hip replacement in the 1980's. Patient has severe right hip pain. Work up from Ortho notable for severe bone loss and osteopenia making possible reconstruction very difficult. Plan is to monitor and treat symptoms, consider repeat imaging at follow up visit. Ortho has referred pain management to her PCP. Ortho does not recommend PT. Patient does endorse adequate pain management with Norco 5-325mg  - she notes she takes 1-2 pills a day. She has been on this medicine for many years (charts date back to 2014). Denies any adverse effects or drowsiness with this dose. PDMP reviewed and no red flags noted. Pain contract signed by previous PCP. Will continue to prescribe monthly as further work up continues for treatment of her pain with ortho. Consider referral to pain management clinic if hip appears to be inoperable.   - refill Norco 5-325mg : 1 tab q6 hours PRN for severe pain. #30. RF: 0 - avoid goody powders or NSAIDs - crutches for assistance with walking - follow up with ortho as scheduled

## 2019-08-07 ENCOUNTER — Other Ambulatory Visit: Payer: Self-pay

## 2019-08-07 ENCOUNTER — Ambulatory Visit (INDEPENDENT_AMBULATORY_CARE_PROVIDER_SITE_OTHER): Payer: Medicare Other | Admitting: Family Medicine

## 2019-08-07 ENCOUNTER — Encounter: Payer: Self-pay | Admitting: Family Medicine

## 2019-08-07 VITALS — BP 128/80 | HR 62 | Wt 188.0 lb

## 2019-08-07 DIAGNOSIS — Z1231 Encounter for screening mammogram for malignant neoplasm of breast: Secondary | ICD-10-CM | POA: Diagnosis not present

## 2019-08-07 DIAGNOSIS — E785 Hyperlipidemia, unspecified: Secondary | ICD-10-CM

## 2019-08-07 DIAGNOSIS — Z1211 Encounter for screening for malignant neoplasm of colon: Secondary | ICD-10-CM | POA: Diagnosis not present

## 2019-08-07 DIAGNOSIS — I10 Essential (primary) hypertension: Secondary | ICD-10-CM

## 2019-08-07 DIAGNOSIS — Z23 Encounter for immunization: Secondary | ICD-10-CM | POA: Diagnosis not present

## 2019-08-07 DIAGNOSIS — M25551 Pain in right hip: Secondary | ICD-10-CM

## 2019-08-07 NOTE — Patient Instructions (Signed)
Thank you so much for coming to see me today.  I ordered your mammogram and sent in for the Cologuard which screens for colon cancer. Please expect something in the mail to complete this. Please call me if you have any questions.  Lets plan to see each other in 6 months, sooner if you need me.  Take care, Dr. Tarry Kos

## 2019-08-07 NOTE — Progress Notes (Signed)
Subjective:   Patient ID: Donna Herrera    DOB: [birth year], 73 y.o. female   MRN: [id number]  Donna Herrera is a 73 y.o. female with a history of HFrEF (40%), HTN, NICM, chornic lung disease, GERD, depression, frail elderly, gout, history of total righ thip replacement with current hip pain, HLD, tobacco abuse, and vitamin D def here for follow up.  Current concerns: None  Chronic Right Hip Pain  H/o total right hip replacement in 80's: Currently followed closely by Dr. Alvan Dame at [facility] who has underwent extensive work up to determine cause for patient hip pain. Appears patient has non-reconstructable pelvis related to bone loss and osteopenia. With shared decision making, both Dr. Alvan Dame and patient have opted to treat conservatory at this time. He does not think she would benefit from physical therapy or further steroid injections. Plan to obtain repeat hip x-rays at follow up visit. Lumbar spine x-ray notable for mild osteoarthritis. Patient is currently treating pain with Norco 5-325. She notes she only takes 1-2 pills a day at most (prescribed for q6 hours). She notes she has a good support system at home with two daughters that are nurses and are very helpful.   Dental Carries: Patient complains of poor dentition and needing an affordable dentist for evaluation. She notes left bottom molar has crack to the gum. Denies any puss/drainage or acute pain. She endorses interest in having them extracted but the cost is concerning for her.   HTN:  BP 128/80 today. Currently taking Entresto BID and Coreg 6.25mg  BID. Endorses compliance. Denies any chest pain, SOB, vision changes, or headaches.   HLD:  Last lipid panel 2016. Currently on Pravastatin 40mg  QD. Endorses compliance. Denies any new muscle weakness or pain.  Health Maintenance: Due for flu vaccine, mammogram, colon cancer screening  Review of Systems:  Per HPI.   [place], medications and smoking status reviewed.   Objective:   BP 128/80   Pulse 62   Wt 188 lb (85.3 kg)   SpO2 98%   BMI 30.34 kg/m  Vitals and nursing note reviewed.  General: pleasant older female, standing with crutches and leaning against exam bed, NAD HEENT: overall poor dentition with multiple dental carries throughout, no signs of acute abscess or infection, gums pink  CV: regular rate and rhythm without murmurs, rubs, or gallops, no lower extremity edema Lungs: clear to auscultation bilaterally with normal work of breathing Skin: warm, dry Extremities: warm and well perfused MSK: difficulty with ambulation, requires crutches  Neuro: Alert and oriented, speech normal   Assessment & Plan:   Right hip pain  Followed closely with orthopedist Dr. Alvan Dame. Patient is s/p total right hip replacement in 1980's with evidence of bone loss and osteopenia making reconstruction unlikely. Plan for conservative treatment at this time with crutches for ambulation and pain control. Patient endorses adequate pain control with Norco 5-325mg . PDMP reviewed and no red flags. Will continue to caution patient on risk of falls given her age and frailty.  - follow up as needed   Essential hypertension Chronic. Optimal control. - Continue current anti-hypertensives   Hyperlipidemia Currently on Pravastatin 40mg  QD and tolerating well. Last lipid panel in 2016. Can consider repeat, however unlikely to change management at this time.   Dental Carries: Chronic. No signs of acute infection. Patient plans to continue to find dentist for extractions. Offered to place dental referral but patient declined at this time. Will place referral upon request.  Health Maintenance: - Mammogram ordered. Patient  to call to schedule - Cologuard ordered. No family history of colon cancer and no personal history of polyps. - Flu shot provided today  Orders Placed This Encounter  Procedures  . MM Digital Screening    Ins-aarp uhc Pf-01/24/17@[facility]  No needs/no hx  br ca/no implants or br red/no new issues ec/pt    Standing Status:   Future    Standing Expiration Date:   10/06/2020    Order Specific Question:   Reason for Exam (SYMPTOM  OR DIAGNOSIS REQUIRED)    Answer:   screen for breast cancer    Order Specific Question:   Preferred imaging location?    Answer:   [facility]  . Flu Vaccine QUAD 36+ mos IM  . Cologuard   RTC in 6 months, sooner if needed  Mina Marble, DO PGY-2, [facility] 08/09/2019 2:23 AM

## 2019-08-09 NOTE — Assessment & Plan Note (Signed)
Currently on Pravastatin 40mg  QD and tolerating well. Last lipid panel in 2016. Can consider repeat, however unlikely to change management at this time.

## 2019-08-09 NOTE — Assessment & Plan Note (Signed)
Chronic. Optimal control. - Continue current anti-hypertensives

## 2019-08-09 NOTE — Assessment & Plan Note (Addendum)
Followed closely with orthopedist Dr. Alvan Dame. Patient is s/p total right hip replacement in 1980's with evidence of bone loss and osteopenia making reconstruction unlikely. Plan for conservative treatment at this time with crutches for ambulation and pain control. Patient endorses adequate pain control with Norco 5-325mg . PDMP reviewed and no red flags. Will continue to caution patient on risk of falls given her age and frailty.  - follow up as needed

## 2019-08-27 LAB — COLOGUARD: Cologuard: POSITIVE — AB

## 2019-08-29 ENCOUNTER — Other Ambulatory Visit: Payer: Self-pay

## 2019-08-29 ENCOUNTER — Telehealth (INDEPENDENT_AMBULATORY_CARE_PROVIDER_SITE_OTHER): Payer: Medicare Other | Admitting: Family Medicine

## 2019-08-29 DIAGNOSIS — R05 Cough: Secondary | ICD-10-CM

## 2019-08-29 DIAGNOSIS — R059 Cough, unspecified: Secondary | ICD-10-CM

## 2019-08-29 MED ORDER — BENZONATATE 100 MG PO CAPS: 100.0000 mg | ORAL_CAPSULE | Freq: Two times a day (BID) | ORAL | 0 refills | Status: DC | PRN

## 2019-08-29 NOTE — Progress Notes (Signed)
[facility] Telemedicine Visit  Patient consented to have virtual visit. Method of visit: Telephone  Encounter participants: Patient: Donna Herrera - located at home in [facility] Provider: Nuala Alpha - located at [facility] Others (if applicable): None  Chief Complaint: Cough  HPI: Patient is a 73y/o with PMH of CHF, HTN, Chronic lung disease, GERD . One month of cough that is productive for clear phlegm with no chest pain, shortness of breath, or difficulty breathing. She has no known COVID exposure and states she was tested a month or so ago and was negative. No new potential exposures since then. She has not been taking her allergy medication. She has had no fever, chills, weight loss.   ROS: per HPI  Pertinent PMHx: CHF, HTN, Chronic lung disease, GERD  Exam:  Respiratory: speaks comfortably in full sentences without needing to stop to breath  Assessment/Plan:  Cough Most likely allergies but considered PNA vs CHF vs acute exacerbation of chronic lung disease. Given her cough is non-productive with no other infectious symptoms doubt PNA or COVID. No new weight gain or difficulty breathing so CHF exacerbation or acute exacerbation of COPD is also unlikely.  Tessalon perles along with cetirizine If not better in 1-2 weeks return call and we will reassess.   Time spent during visit with patient: >20 minutes  Harolyn Rutherford, DO [facility], PGY-3

## 2019-09-02 ENCOUNTER — Other Ambulatory Visit: Payer: Self-pay | Admitting: Family Medicine

## 2019-09-02 DIAGNOSIS — G894 Chronic pain syndrome: Secondary | ICD-10-CM

## 2019-09-02 NOTE — Telephone Encounter (Signed)
Pt would like to have her hydrocodone refilled.  °

## 2019-09-04 MED ORDER — HYDROCODONE-ACETAMINOPHEN 5-325 MG PO TABS: 1.0000 | ORAL_TABLET | Freq: Four times a day (QID) | ORAL | 0 refills | Status: DC | PRN

## 2019-09-04 NOTE — Telephone Encounter (Signed)
Had difficulty filling on day of request due to technical difficulties with electronic ordering of controlled substances. Refill ordered as requested.

## 2019-09-05 ENCOUNTER — Telehealth: Payer: Self-pay | Admitting: Family Medicine

## 2019-09-05 DIAGNOSIS — R195 Other fecal abnormalities: Secondary | ICD-10-CM

## 2019-09-05 NOTE — Telephone Encounter (Signed)
Spoke to patient about positive Cologuard results - discussed these results and there meaning. Recommended diagnostic colonoscopy for further evaluation. She agreed. Referral placed.

## 2019-09-24 ENCOUNTER — Other Ambulatory Visit: Payer: Self-pay

## 2019-09-24 ENCOUNTER — Ambulatory Visit
Admission: RE | Admit: 2019-09-24 | Discharge: 2019-09-24 | Disposition: A | Discharge status: Home / Self Care | Payer: Medicare Other | Source: Ambulatory Visit | Attending: Family Medicine | Admitting: Family Medicine

## 2019-09-24 DIAGNOSIS — Z1231 Encounter for screening mammogram for malignant neoplasm of breast: Secondary | ICD-10-CM

## 2019-09-30 ENCOUNTER — Other Ambulatory Visit: Payer: Self-pay | Admitting: Physician Assistant

## 2019-10-01 ENCOUNTER — Other Ambulatory Visit: Payer: Self-pay | Admitting: *Deleted

## 2019-10-02 MED ORDER — CETIRIZINE HCL 10 MG PO TABS: ORAL_TABLET | ORAL | 5 refills | Status: DC

## 2019-10-02 MED ORDER — POTASSIUM CHLORIDE CRYS ER 20 MEQ PO TBCR: 20.0000 meq | EXTENDED_RELEASE_TABLET | Freq: Every day | ORAL | 1 refills | Status: DC

## 2019-10-07 ENCOUNTER — Encounter: Payer: Self-pay | Admitting: Family Medicine

## 2019-10-09 ENCOUNTER — Other Ambulatory Visit: Payer: Self-pay | Admitting: *Deleted

## 2019-10-09 DIAGNOSIS — G894 Chronic pain syndrome: Secondary | ICD-10-CM

## 2019-10-09 NOTE — Telephone Encounter (Signed)
Please inform patient she has a prescription she could have filled on 12/4 and another refill available starting in January. It is too early to refill any further.

## 2019-10-10 ENCOUNTER — Encounter: Payer: Self-pay | Admitting: *Deleted

## 2019-10-10 NOTE — Progress Notes (Signed)
Received patient assistance forms from patient [employer] [place] to re-enroll for [employer].  The current enrollment expires 10/31/19.  The forms are signed by patient.  Signed today by Dr. Harrington Challenger.    Will have forms scanned to P. Via, LPN to complete and send.

## 2019-10-11 NOTE — Telephone Encounter (Signed)
Please clarify which medications have been filled and which ones have not. Kahlan Engebretson Kennon Holter, CMA

## 2019-10-12 NOTE — Telephone Encounter (Signed)
Please determine what patient needs. I provided three prescriptions, one for November, December, and January. Next prescription is not due until February, thus I will not be providing refills of these at this time. Thank you.

## 2019-10-16 NOTE — Telephone Encounter (Signed)
Attempted to call patient x 2 to inquire  concerning RX request.  No answer and no ability to leave voice mail.  Phone continues to have busy signal.  .Ozella Almond, [place]

## 2019-10-17 NOTE — Progress Notes (Signed)
Cardiology Office Note   Date:  10/18/2019   ID:  Donna Herrera, DOB Jul 23, 1946, MRN [id number]  PCP:  Danna Hefty, DO  Cardiologist:   Dorris Carnes, MD   F/U of CHF     History of Present Illness: Donna Herrera is a 73 y.o. female with a history of systolic CHF   LVEF in 15 to 20% (Cath in 2017 normal)   Repeat in 2019 LVEF 40 to 66  Pt also has a hx of HTN, HL and tob use    Last seen by Donna Herrera in clinic in June 2020 for presurgical clearance (hip surgery)  The pt says for the past couple week she has had swelling in legs   Trying to watch salt but son takes care of food.    She has had 2 episodes of  throbbing in chest   Not assoc with activity  Last one a week ago Denies palpitations      Current Meds  Medication Sig  . albuterol (PROVENTIL HFA;VENTOLIN HFA) 108 (90 Base) MCG/ACT inhaler Inhale 1-2 puffs into the lungs every 6 (six) hours as needed for wheezing.  [name] aspirin 81 MG tablet Take 1 tablet (81 mg total) by mouth daily.  . baclofen (LIORESAL) 10 MG tablet Take 1 tablet (10 mg total) by mouth 3 (three) times daily.  . Capsaicin-Menthol-Methyl Sal (CAPSAICIN-METHYL SAL-MENTHOL) 0.025-1-12 % CREA Apply 1 application topically 4 (four) times daily.  . carvedilol (COREG) 6.25 MG tablet Take 1 tablet (6.25 mg total) by mouth 2 (two) times daily.  . cetirizine (ZYRTEC) 10 MG tablet TAKE 1 TABLET (10 MG TOTAL) BY MOUTH DAILY.  [name] colchicine 0.6 MG tablet Take 1 tablet (0.6 mg total) by mouth daily.  [name] FLUoxetine (PROZAC) 20 MG tablet Take 1 tablet (20 mg total) by mouth daily.  . furosemide (LASIX) 20 MG tablet Take 1 tablet (20 mg total) by mouth daily.  [name] HYDROcodone-acetaminophen (NORCO/VICODIN) 5-325 MG tablet Take 1 tablet by mouth every 6 (six) hours as needed for moderate pain.  . pantoprazole (PROTONIX) 20 MG tablet TAKE 2 TABLETS BY MOUTH EVERY DAY  . potassium chloride SA (KLOR-CON) 20 MEQ tablet Take 1 tablet (20 mEq total) by mouth daily.   . sacubitril-valsartan (ENTRESTO) 24-26 MG Take 1 tablet by mouth 2 (two) times daily.  [name] spironolactone (ALDACTONE) 25 MG tablet Take 1 tablet (25 mg total) by mouth daily.  [name] triamcinolone ointment (KENALOG) 0.5 % Apply 1 application topically 2 (two) times daily. To palms  . Vitamin D, Ergocalciferol, (DRISDOL) 50000 units CAPS capsule Take 1 capsule (50,000 Units total) by mouth every 7 (seven) days. After 8 weeks, take over-the-counter vitamin D 2000 units daily.     Allergies:   Crab [shellfish allergy] and Morphine and related   Past Medical History:  Diagnosis Date  . Allergy   . Arthritis   . Candidiasis, mouth 10/19/2017  . Chronic combined systolic and diastolic CHF (congestive heart failure) ([place]) 10/21/2015   Echo 3/16: EF 40%, diffuse HK, mild MR, moderate LAE, mild RAE, PASP 42 mmHg  //  Echo 12/16: Mild LVH, EF 35-40%, diffuse HK, Gr 2 DD, MAC, mild MR, mod LAE, normal RVSF, PASP 49 mmHg // Echo 5/19: EF 15-20, diff HK, Gr 2 DD, trivial AI, MAC, trivial MR, mod LAE, normal RVSF, mild TR, PASP 62 // Limited Echo 8/19: mod LVH, EF 40-45, diff HK, Gr 1 DD, MAC, mild LAE   . Chronic left shoulder pain  04/10/2013  . Chronic lung disease   . Chronic pain   . Chronic pain syndrome 07/20/2017  . Depression 08/25/2016  . DJD (degenerative joint disease) 10/12/2012   Multiple joint replacements by Dr. Collier Salina.   . Dyshidrotic eczema 12/31/2014  . Essential hypertension 07/20/2017  . GERD (gastroesophageal reflux disease)   . Gout   . H/O slipped capital femoral epiphysis (SCFE)   . Hyperlipidemia   . Hypertension   . Hypertensive heart disease with CHF (congestive heart failure) ([facility]) 10/12/2012  . Loss of taste 02/06/2018  . Mass of breast, left 07/21/2014  . NICM (nonischemic cardiomyopathy) ([place]) 06/09/2016   A. LHC 7/17: Normal coronary arteries, EF 30-35%, LVEDP 14 mmHg  . Pain in gums 04/18/2018  . Right hip pain 11/02/2012  . Tendonitis, Achilles, left 05/24/2017  . Tobacco  abuse 09/29/2014  . Vitamin D deficiency 12/31/2014    Past Surgical History:  Procedure Laterality Date  . CARDIAC CATHETERIZATION  2005   normal-[place]ochrein  . CARDIAC CATHETERIZATION N/A 05/11/2016   Procedure: Right/Left Heart Cath and Coronary Angiography;  Surgeon: Peter M Martinique, MD;  Location: [facility];  Service: Cardiovascular;  Laterality: N/A;  . CESAREAN SECTION    . HIP FUSION     left   . [place]  . ROTATOR CUFF REPAIR     bilateral  . TOTAL HIP ARTHROPLASTY  1987   right. Dr. Collier Salina  . TUBAL LIGATION       Social History:  The patient  reports that she has quit smoking. She smoked 0.50 packs per day. She uses smokeless tobacco. She reports current alcohol use. She reports that she does not use drugs.   Family History:  The patient's family history includes Cancer in her father; Depression in her mother; Diabetes in her mother and sister; Heart disease in her mother; Hypertension in her mother; Stroke in her mother.    ROS:  Please see the history of present illness. All other systems are reviewed and  Negative to the above problem except as noted.    PHYSICAL EXAM: VS:  BP (!) 154/82   Pulse (!) 56   Ht _0  (1.676 m)   Wt 188 lb 9.6 oz (85.5 kg)   SpO2 98%   BMI 30.44 kg/m   GEN: OVerweight 73 yo in no acute distress  HEENT: normal  Neck: no JVD   Cardiac: RRR; no murmurs, rubs, or gallops  1+ edema  Respiratory:  clear to auscultation bilaterally, normal work of breathing GI: soft, nontender, nondistended, + BS  No hepatomegaly  MS: no deformity Moving all extremities   Skin: warm and dry, no rash Neuro:  Strength and sensation are intact Psych: euthymic mood, full affect   EKG:  EKG is not ordered today.   Lipid Panel    Component Value Date/Time   CHOL 177 10/04/2015 0141   TRIG 78 10/04/2015 0141   HDL 50 10/04/2015 0141   CHOLHDL 3.5 10/04/2015 0141   VLDL 16 10/04/2015 0141   LDLCALC 111  (H) 10/04/2015 0141      Wt Readings from Last 3 Encounters:  10/18/19 188 lb 9.6 oz (85.5 kg)  08/07/19 188 lb (85.3 kg)  04/24/19 190 lb (86.2 kg)      ASSESSMENT AND PLAN:  1 Hx nonischemic cardiolmyopathy   Last LVEF 40 to 45%   Volume is up some on exam but lungs are clear   Would recomm  doubling lasix to 40 tomorrow.  Will review labs and decide on how often she should take   Will contact her     Pt needs to watch salt  Will arrange for f/u in 3 wks     2  HTN  BP has been good until todays check    Will need to follow     Current medicines are reviewed at length with the patient today.  The patient does not have concerns regarding medicines.  Signed, Dorris Carnes, MD  10/18/2019 11:31 AM    [address], [place], [place]  [id number] Phone: [phone]; Fax: [phone]

## 2019-10-18 ENCOUNTER — Encounter: Payer: Self-pay | Admitting: Internal Medicine

## 2019-10-18 ENCOUNTER — Other Ambulatory Visit: Payer: Self-pay

## 2019-10-18 ENCOUNTER — Ambulatory Visit: Payer: Medicare Other | Admitting: Internal Medicine

## 2019-10-18 VITALS — BP 154/82 | HR 56 | Ht 66.0 in | Wt 188.6 lb

## 2019-10-18 DIAGNOSIS — I5020 Unspecified systolic (congestive) heart failure: Secondary | ICD-10-CM

## 2019-10-18 DIAGNOSIS — Z79899 Other long term (current) drug therapy: Secondary | ICD-10-CM

## 2019-10-18 LAB — BASIC METABOLIC PANEL
BUN/Creatinine Ratio: 14 (ref 12–28)
BUN: 12 mg/dL (ref 8–27)
CO2: 27 mmol/L (ref 20–29)
Calcium: 9 mg/dL (ref 8.7–10.3)
Chloride: 103 mmol/L (ref 96–106)
Creatinine, Ser: 0.85 mg/dL (ref 0.57–1.00)
GFR calc Af Amer: 78 mL/min/{1.73_m2} (ref >59)
GFR calc non Af Amer: 68 mL/min/{1.73_m2} (ref >59)
Glucose: 105 mg/dL — ABNORMAL HIGH (ref 65–99)
Potassium: 3.5 mmol/L (ref 3.5–5.2)
Sodium: 140 mmol/L (ref 134–144)

## 2019-10-18 LAB — PRO B NATRIURETIC PEPTIDE: NT-Pro BNP: 378 pg/mL — ABNORMAL HIGH (ref 0–301)

## 2019-10-18 NOTE — Patient Instructions (Addendum)
Medication Instructions:  Your physician has recommended you make the following change in your medication:  Take Furosemide 40 mg (2 tablets) and Potassium 40 meq (2 tablets) until our office calls you with lab results.  *If you need a refill on your cardiac medications before your next appointment, please call your pharmacy*  Lab Work: Your physician recommends that you have lab work - BMET and BNP   If you have labs (blood work) drawn today and your tests are completely normal, you will receive your results only by: [name] MyChart Message (if you have MyChart) OR . A paper copy in the mail If you have any lab test that is abnormal or we need to change your treatment, we will call you to review the results.  Testing/Procedures: None ordered today.  Follow-Up: At [facility], you and your health needs are our priority.  As part of our continuing mission to provide you with exceptional heart care, we have created designated Provider Care Teams.  These Care Teams include your primary Cardiologist (physician) and Advanced Practice Providers (APPs -  Physician Assistants and Nurse Practitioners) who all work together to provide you with the care you need, when you need it.  Your next appointment:   3 weeks  The format for your next appointment:   In Person  Provider:   You may see Dorris Carnes, MD or one of the following Advanced Practice Providers on your designated Care Team:    [name], PA-C  [place], [place]  Daune Perch, [place]

## 2019-10-21 ENCOUNTER — Ambulatory Visit (INDEPENDENT_AMBULATORY_CARE_PROVIDER_SITE_OTHER): Payer: Medicare Other | Admitting: Family Medicine

## 2019-10-21 ENCOUNTER — Encounter: Payer: Self-pay | Admitting: Family Medicine

## 2019-10-21 ENCOUNTER — Telehealth: Payer: Self-pay

## 2019-10-21 ENCOUNTER — Other Ambulatory Visit: Payer: Self-pay

## 2019-10-21 VITALS — BP 152/80 | HR 61 | Wt 187.2 lb

## 2019-10-21 DIAGNOSIS — M7501 Adhesive capsulitis of right shoulder: Secondary | ICD-10-CM

## 2019-10-21 DIAGNOSIS — M1A09X0 Idiopathic chronic gout, multiple sites, without tophus (tophi): Secondary | ICD-10-CM | POA: Diagnosis not present

## 2019-10-21 DIAGNOSIS — M7741 Metatarsalgia, right foot: Secondary | ICD-10-CM | POA: Diagnosis not present

## 2019-10-21 DIAGNOSIS — M7742 Metatarsalgia, left foot: Secondary | ICD-10-CM

## 2019-10-21 MED ORDER — COLCHICINE 0.6 MG PO TABS: 0.6000 mg | ORAL_TABLET | Freq: Every day | ORAL | 2 refills | Status: DC

## 2019-10-21 MED ORDER — ALLOPURINOL 100 MG PO TABS: 100.0000 mg | ORAL_TABLET | Freq: Every day | ORAL | 6 refills | Status: DC

## 2019-10-21 NOTE — Patient Instructions (Addendum)
You have a frozen shoulder (adhesive capsulitis), a buildup of scar tissue that limits motion of the shoulder joint. Limit lifting and overhead activities as much as possible. Heat 15 minutes at a time 3-4 times a day may help with movement and stiffness. Tylenol and/or aleve as needed for pain and inflammation. Steroid injections in a series have been shown to help with pain and motion. Codman exercises (pendulum, wall walking or table slides, arm circles) - do 3 sets of 10 once or twice a day. Physical therapy for rotator cuff strengthening is a consideration once you are out of the painful phase Follow up in 6 weeks  Gout: Please start taking the Allopurinol daily. This will help prevent gout flare ups. You will need to overlap treatment with the Colchicine 0.6mg : 1 pill a day for 3 months to help prevent a reflex gout flare while the Allopurinol is starting to work.  Metatarsalgia: I expect the pain in the bottom of your foot is from metatarsalgia. The Pain is from inflammation. The best way to treat this is to wear supportive shoes. We can consider referral to sports medicine to get you fitted with orthotics if it continues to be painful for you. Please let me know and we will do that.   Take care and Happy Holidays.

## 2019-10-21 NOTE — Progress Notes (Signed)
Subjective:   Patient ID: Donna Herrera    DOB: 1946-09-07, 73 y.o. female   MRN: [id number]  Donna Herrera is a 73 y.o. female with a history of chronic left shoulder pain s/p bilateral rotator cuff repair and h/o gout of multiple joints without tophus here for right shoulder pain.  Right shoulder pain: Patient notes right shoulder pain x 1 month. She notes the pain "is in the joint - pointing to the axilla and in the back. She notes it is difficult for her to lift her arm above her head. She uses crutches chronically due to her chronic hip pain. She notes some improvement when she took a break from her crutches. Denies any trauma.  Right shoulder x-ray in 2005 notable for Severe degenerative changes of the right shoulder with evidence consistent with a chronic complete rotator cuff tear. She had rotator cuff repair in 2005.   Foot Pain:  Pain at sole of foot, L>R. She notes the pain is located at base of 2nd great toe. Hurts primarily with walking. Denies any trauma.   History of Gout without tophus: Patient notes she recently had a gout flare that improved with colchicine. The pain is much improved today. She notes she is not on any controller medicine. She is interested in starting a controller medicine if it isnt too expensive.   Review of Systems:  Per HPI.   [place], medications and smoking status reviewed.  Objective:   BP (!) 152/80   Pulse 61   Wt 187 lb 4 oz (84.9 kg)   SpO2 99%   BMI 30.22 kg/m  Vitals and nursing note reviewed.  General: pleasant older lady, sitting comfortably on exam chair initially then stood at bedside with crutches, in no acute distress with non-toxic appearance Resp: Speaking in full sentences, unlabored breathing Skin: warm, dry, no erythema or swelling appreciated on all toes  Extremities: warm and well perfused, Left plantar foot at 2nd-3rd toe tender to palpation, small callus located at tender site, no associated erythema,  edema or warmth, mild pain at same location on right foot, Right and left foot neurovascularly intact, bilateral feet with short first metatarsal bone with long second metatarsal bone MSK: left and right foot with normal ROM, see shoulder exam below  Right Shoulder: Inspection reveals no obvious deformity, atrophy, or asymmetry. No bruising. No swelling Palpation is normal with no TTP over [facility] joint or bicipital groove. Decreased ROM in flexion, abduction, internal/external rotation with extensive pain with internal/external rotation NV intact distally Special Tests:  - Impingement: Neg Hawkins, empty can sign. - Supraspinatous: Negative empty can.  5/5 strength with resisted flexion at 20 degrees - Subscapularis: pain with IR - Biceps tendon: Negative Yerrgason's  - AC Joint: Negative cross arm - No painful arc and no drop arm sign  Left Shoulder: Inspection reveals no obvious deformity, atrophy, or asymmetry. No bruising. No swelling Palpation is normal with no TTP over [facility] joint or bicipital groove. Full ROM in flexion, abduction, internal/external rotation NV intact distally   Assessment & Plan:   Adhesive capsulitis of right shoulder History and physical exam appear most consistent with frozen shoulder. No trauma to indicate rotator cuff, bicep tendon tear or labral pathology. Recommended conservative therapy at this time including heat 3-4 times/day, tylenol/aleve Prn for pain and inflammation, Codman exercises (handout provided to patient). RTC in 6 weeks, sooner if no improvement. Consider ICS in joint vs referral to PT. Patient voiced understanding and agreed to plan.  Metatarsalgia of both feet Appears most consistent with metatarsalgia of the feet given history and physical exam. Not quite supportive of morton's neuroma although possible. Fortunately initial treatment is the same for both.  Recommended patient wear supportive shoes. Recommended OTC arch supports. Can consider  referral to sports medicine to get fitted with orthotics if it continues to be painful for her. Tylenol/ibuprofen PRN for pain.  If no improvement in 4-6 weeks then consider x-ray or ultrasound to rule out bony abnormality.   Gout History of gout not on controller med. No current gout flare. Treats gout flares PRN with colchicine. Patient interested in controller med if affordable. Uric acid level obtained and noted to be elevated to 8.4 today. Patient instructed to start Allopurinol daily. She will need to overlap treatment with Colchicine 0.6mg : 1 pill a day x 3 months to help prevent a reflex gout flare. Patient understood and agreed to plan.   Orders Placed This Encounter  Procedures  . Uric Acid   Meds ordered this encounter  Medications  . allopurinol (ZYLOPRIM) 100 MG tablet    Sig: Take 1 tablet (100 mg total) by mouth daily.    Dispense:  30 tablet    Refill:  6  . colchicine 0.6 MG tablet    Sig: Take 1 tablet (0.6 mg total) by mouth daily.    Dispense:  30 tablet    Refill:  2   Mina Marble, DO PGY-2, [facility] 10/28/2019 8:25 PM

## 2019-10-21 NOTE — Telephone Encounter (Signed)
Pt Asst application has been signed and completed. Faxed to [employer].

## 2019-10-22 ENCOUNTER — Other Ambulatory Visit: Payer: Self-pay | Admitting: *Deleted

## 2019-10-22 DIAGNOSIS — I5020 Unspecified systolic (congestive) heart failure: Secondary | ICD-10-CM

## 2019-10-22 DIAGNOSIS — I5042 Chronic combined systolic (congestive) and diastolic (congestive) heart failure: Secondary | ICD-10-CM

## 2019-10-22 LAB — URIC ACID: Uric Acid: 8.4 mg/dL — ABNORMAL HIGH (ref 3.1–7.9)

## 2019-10-22 MED ORDER — POTASSIUM CHLORIDE CRYS ER 20 MEQ PO TBCR: EXTENDED_RELEASE_TABLET | ORAL | 1 refills | Status: DC

## 2019-10-22 MED ORDER — FUROSEMIDE 20 MG PO TABS: ORAL_TABLET | ORAL | Status: DC

## 2019-10-22 NOTE — Progress Notes (Signed)
Starting Allopurinol/Colchicine(x55mo) maintenance therapy. Plan to recheck Uric acid level at follow up visit on 1/22.

## 2019-10-28 ENCOUNTER — Encounter: Payer: Self-pay | Admitting: Family Medicine

## 2019-10-28 ENCOUNTER — Other Ambulatory Visit: Payer: Self-pay | Admitting: *Deleted

## 2019-10-28 DIAGNOSIS — G894 Chronic pain syndrome: Secondary | ICD-10-CM

## 2019-10-28 DIAGNOSIS — M7741 Metatarsalgia, right foot: Secondary | ICD-10-CM | POA: Insufficient documentation

## 2019-10-28 DIAGNOSIS — M79671 Pain in right foot: Secondary | ICD-10-CM | POA: Insufficient documentation

## 2019-10-28 DIAGNOSIS — M7501 Adhesive capsulitis of right shoulder: Secondary | ICD-10-CM | POA: Insufficient documentation

## 2019-10-28 DIAGNOSIS — M79672 Pain in left foot: Secondary | ICD-10-CM | POA: Insufficient documentation

## 2019-10-28 MED ORDER — BACLOFEN 10 MG PO TABS: 10.0000 mg | ORAL_TABLET | Freq: Three times a day (TID) | ORAL | 3 refills | Status: DC

## 2019-10-28 NOTE — Assessment & Plan Note (Signed)
History of gout not on controller med. No current gout flare. Treats gout flares PRN with colchicine. Patient interested in controller med if affordable. Uric acid level obtained and noted to be elevated to 8.4 today. Patient instructed to start Allopurinol daily. She will need to overlap treatment with Colchicine 0.6mg : 1 pill a day x 3 months to help prevent a reflex gout flare. Patient understood and agreed to plan.

## 2019-10-28 NOTE — Assessment & Plan Note (Signed)
History and physical exam appear most consistent with frozen shoulder. No trauma to indicate rotator cuff, bicep tendon tear or labral pathology. Recommended conservative therapy at this time including heat 3-4 times/day, tylenol/aleve Prn for pain and inflammation, Codman exercises (handout provided to patient). RTC in 6 weeks, sooner if no improvement. Consider ICS in joint vs referral to PT. Patient voiced understanding and agreed to plan.

## 2019-10-28 NOTE — Assessment & Plan Note (Signed)
Appears most consistent with metatarsalgia of the feet given history and physical exam. Not quite supportive of morton's neuroma although possible. Fortunately initial treatment is the same for both.  Recommended patient wear supportive shoes. Recommended OTC arch supports. Can consider referral to sports medicine to get fitted with orthotics if it continues to be painful for her. Tylenol/ibuprofen PRN for pain.  If no improvement in 4-6 weeks then consider x-ray or ultrasound to rule out bony abnormality.

## 2019-11-03 DIAGNOSIS — R54 Age-related physical debility: Secondary | ICD-10-CM | POA: Diagnosis not present

## 2019-11-03 DIAGNOSIS — G894 Chronic pain syndrome: Secondary | ICD-10-CM | POA: Diagnosis not present

## 2019-11-03 DIAGNOSIS — M25551 Pain in right hip: Secondary | ICD-10-CM | POA: Diagnosis not present

## 2019-11-03 DIAGNOSIS — I428 Other cardiomyopathies: Secondary | ICD-10-CM | POA: Diagnosis not present

## 2019-11-03 DIAGNOSIS — Z96641 Presence of right artificial hip joint: Secondary | ICD-10-CM | POA: Diagnosis not present

## 2019-11-04 ENCOUNTER — Other Ambulatory Visit: Payer: Medicare Other | Admitting: *Deleted

## 2019-11-04 ENCOUNTER — Other Ambulatory Visit: Payer: Self-pay

## 2019-11-04 DIAGNOSIS — I5042 Chronic combined systolic (congestive) and diastolic (congestive) heart failure: Secondary | ICD-10-CM

## 2019-11-04 DIAGNOSIS — I5020 Unspecified systolic (congestive) heart failure: Secondary | ICD-10-CM

## 2019-11-05 LAB — BASIC METABOLIC PANEL
BUN/Creatinine Ratio: 14 (ref 12–28)
BUN: 12 mg/dL (ref 8–27)
CO2: 23 mmol/L (ref 20–29)
Calcium: 9.1 mg/dL (ref 8.7–10.3)
Chloride: 100 mmol/L (ref 96–106)
Creatinine, Ser: 0.86 mg/dL (ref 0.57–1.00)
GFR calc Af Amer: 77 mL/min/{1.73_m2} (ref >59)
GFR calc non Af Amer: 67 mL/min/{1.73_m2} (ref >59)
Glucose: 95 mg/dL (ref 65–99)
Potassium: 3.9 mmol/L (ref 3.5–5.2)
Sodium: 139 mmol/L (ref 134–144)

## 2019-11-05 LAB — PRO B NATRIURETIC PEPTIDE: NT-Pro BNP: 291 pg/mL (ref 0–301)

## 2019-11-11 NOTE — Progress Notes (Signed)
Cardiology Office Note:    Date:  11/12/2019   ID:  Donna Herrera, DOB 19-Apr-1946, MRN [id number]  PCP:  Danna Hefty, DO  Cardiologist:  Dorris Carnes, MD  Electrophysiologist:  None   Referring MD: Danna Hefty, DO   Chief Complaint  Patient presents with  . Follow-up    CHF    History of Present Illness:    Donna Herrera is a 74 y.o. female with:   Chronic systolic CHF ? Echo 5/19: EF 15-20; Echo 8/19: EF 40-45  Non-Ischemic CM  Tobacco use  Hypertension   Hyperlipidemia  Slipped Capital Femoral Epiphysis syndrome ? S/p multiple hip procedures  Donna Herrera was last seen by Dr. Harrington Challenger in clinic on 10/18/2019.  She was s/w volume overloaded and her Lasix was adjusted.    She returns for follow up.  She is here alone.  She feels better.  Her legs are less swollen.  Her breathing is improved.  She has not had chest pain, syncope.  Her BP has been running high and she has been having some headaches.     Prior CV studies:   The following studies were reviewed today:  Echo 06/06/18 Mod LVH, EF 40-45, diff HK, Gr 1 DD, MAC, trivial MR, mild LAE, trivial TR  Echo 03/06/2018 EF 15-20, diffuse HK, grade 2 diastolic dysfunction, trivial AI, MAC, trivial MR, moderate LAE, normal RVSF, mild TR, PASP 62  LHC 05/11/16 1. Normal coronary anatomy 2. Severe LV dysfunction with EF 30-35%. 3. Normal right heart and LV filling pressures.  Echo 10/05/15 Mild LVH, EF 35-40%, diffuse HK, grade 2 diastolic dysfunction, MAC, mild MR, moderate LAE, normal RVSF, PASP 49 mmHg  Echo 01/16/15 EF 40%, diffuse HK, mild MR, moderate LAE, mild RAE, PASP 42 mmHg  Past Medical History:  Diagnosis Date  . Allergy   . Arthritis   . Candidiasis, mouth 10/19/2017  . Chronic combined systolic and diastolic CHF (congestive heart failure) ([place]) 10/21/2015   Echo 3/16: EF 40%, diffuse HK, mild MR, moderate LAE, mild RAE, PASP 42 mmHg  //  Echo 12/16: Mild LVH, EF  35-40%, diffuse HK, Gr 2 DD, MAC, mild MR, mod LAE, normal RVSF, PASP 49 mmHg // Echo 5/19: EF 15-20, diff HK, Gr 2 DD, trivial AI, MAC, trivial MR, mod LAE, normal RVSF, mild TR, PASP 62 // Limited Echo 8/19: mod LVH, EF 40-45, diff HK, Gr 1 DD, MAC, mild LAE   . Chronic left shoulder pain 04/10/2013  . Chronic lung disease   . Chronic pain   . Chronic pain syndrome 07/20/2017  . Depression 08/25/2016  . DJD (degenerative joint disease) 10/12/2012   Multiple joint replacements by Dr. Collier Salina.   . Dyshidrotic eczema 12/31/2014  . Essential hypertension 07/20/2017  . GERD (gastroesophageal reflux disease)   . Gout   . H/O slipped capital femoral epiphysis (SCFE)   . History of total right hip replacement 04/02/2019   Followed closely with orthopedist Dr. Alvan Dame. Patient is s/p total right hip replacement in 1980's with evidence of bone loss and osteopenia making reconstruction unlikely. Plan for conservative treatment at this time with crutches for ambulation and pain control.  . Hyperlipidemia   . Hypertension   . Hypertensive heart disease with CHF (congestive heart failure) ([place]) 10/12/2012  . Loss of taste 02/06/2018  . Mass of breast, left 07/21/2014  . NICM (nonischemic cardiomyopathy) ([place]) 06/09/2016   A. LHC 7/17: Normal coronary arteries, EF 30-35%, LVEDP 14 mmHg  .  Pain in gums 04/18/2018  . Right hip pain 11/02/2012  . Tendonitis, Achilles, left 05/24/2017  . Tobacco abuse 09/29/2014  . Vitamin D deficiency 12/31/2014   Surgical Hx: The patient  has a past surgical history that includes Cesarean section; Tubal ligation; Total hip arthroplasty (1987); Joint replacement (1991, 1997); Hip fusion; Rotator cuff repair; Cardiac catheterization (2005); and Cardiac catheterization (N/A, 05/11/2016).   Current Medications: Current Meds  Medication Sig  . albuterol (PROVENTIL HFA;VENTOLIN HFA) 108 (90 Base) MCG/ACT inhaler Inhale 1-2 puffs into the lungs every 6 (six) hours as needed for wheezing.    [name] allopurinol (ZYLOPRIM) 100 MG tablet Take 1 tablet (100 mg total) by mouth daily.  [name] aspirin 81 MG tablet Take 1 tablet (81 mg total) by mouth daily.  . baclofen (LIORESAL) 10 MG tablet Take 1 tablet (10 mg total) by mouth 3 (three) times daily.  . Capsaicin-Menthol-Methyl Sal (CAPSAICIN-METHYL SAL-MENTHOL) 0.025-1-12 % CREA Apply 1 application topically 4 (four) times daily.  . carvedilol (COREG) 6.25 MG tablet Take 1 tablet (6.25 mg total) by mouth 2 (two) times daily.  . cetirizine (ZYRTEC) 10 MG tablet TAKE 1 TABLET (10 MG TOTAL) BY MOUTH DAILY.  [name] colchicine 0.6 MG tablet Take 1 tablet (0.6 mg total) by mouth daily.  [name] FLUoxetine (PROZAC) 20 MG tablet Take 1 tablet (20 mg total) by mouth daily.  . furosemide (LASIX) 20 MG tablet Take 40 mg daily alternating with 20 mg daily  . HYDROcodone-acetaminophen (NORCO/VICODIN) 5-325 MG tablet Take 1 tablet by mouth every 6 (six) hours as needed for moderate pain.  . pantoprazole (PROTONIX) 20 MG tablet TAKE 2 TABLETS BY MOUTH EVERY DAY  . potassium chloride SA (KLOR-CON) 20 MEQ tablet Take 40 meq daily alternating with 20 meq daily  . spironolactone (ALDACTONE) 25 MG tablet Take 1 tablet (25 mg total) by mouth daily.  [name] triamcinolone ointment (KENALOG) 0.5 % Apply 1 application topically 2 (two) times daily. To palms  . Vitamin D, Ergocalciferol, (DRISDOL) 50000 units CAPS capsule Take 1 capsule (50,000 Units total) by mouth every 7 (seven) days. After 8 weeks, take over-the-counter vitamin D 2000 units daily.  . [DISCONTINUED] sacubitril-valsartan (ENTRESTO) 24-26 MG Take 1 tablet by mouth 2 (two) times daily.     Allergies:   Crab [shellfish allergy] and Morphine and related   Social History   Tobacco Use  . Smoking status: Former Smoker    Packs/day: 0.50  . Smokeless tobacco: Current User  . Tobacco comment: decreased smoking/1 pack every 3 days  Substance Use Topics  . Alcohol use: Yes    Comment: on holidays  . Drug use: No      Family Hx: The patient's family history includes Cancer in her father; Depression in her mother; Diabetes in her mother and sister; Heart disease in her mother; Hypertension in her mother; Stroke in her mother. There is no history of Breast cancer.  ROS:   Please see the history of present illness.    ROS All other systems reviewed and are negative.   EKGs/Labs/Other Test Reviewed:    EKG:  EKG is  ordered today.  The ekg ordered today demonstrates sinus brady, HR 54, normal axis, low voltage, QTc 451 ms, no changes.   Recent Labs: 04/02/2019: Hemoglobin 11.9 06/12/2019: ALT 10 07/16/2019: Magnesium 2.1 11/04/2019: BUN 12; Creatinine, Ser 0.86; NT-Pro BNP 291; Potassium 3.9; Sodium 139   Recent Lipid Panel Lab Results  Component Value Date/Time   CHOL 177 10/04/2015 01:41 AM  TRIG 78 10/04/2015 01:41 AM   HDL 50 10/04/2015 01:41 AM   CHOLHDL 3.5 10/04/2015 01:41 AM   LDLCALC 111 (H) 10/04/2015 01:41 AM    Physical Exam:    VS:  BP (!) 146/80   Pulse (!) 54   Ht _0  (1.676 m)   Wt 185 lb (83.9 kg)   SpO2 99%   BMI 29.86 kg/m     Wt Readings from Last 3 Encounters:  11/12/19 185 lb (83.9 kg)  10/21/19 187 lb 4 oz (84.9 kg)  10/18/19 188 lb 9.6 oz (85.5 kg)     Physical Exam  Constitutional: She is oriented to person, place, and time. She appears well-developed and well-nourished. No distress.  HENT:  Head: Normocephalic and atraumatic.  Eyes: No scleral icterus.  Neck: No JVD present. No thyromegaly present.  Cardiovascular: Normal rate, regular rhythm and normal heart sounds.  No murmur heard. Pulmonary/Chest: Effort normal and breath sounds normal. She has no rales.  Abdominal: Soft. There is no hepatomegaly.  Musculoskeletal:        General: No edema.  Lymphadenopathy:    She has no cervical adenopathy.  Neurological: She is alert and oriented to person, place, and time.  Skin: Skin is warm and dry.  Psychiatric: She has a normal mood and affect.     ASSESSMENT & PLAN:    1. Chronic combined systolic and diastolic CHF (congestive heart failure) (HCC) EF 40-45 (05/2018).  NYHA 2.  Volume status improved.  Continue current dose of Furosemide.  Continue Carvedilol, Entresto, Spironolactone.    2. Hypertensive heart disease with chronic combined systolic and diastolic congestive heart failure (HCC) BP has been running high.  Increase Entresto to 49/51 mg twice daily. Obtain follow up BMET in 2 weeks.  FU with me in 3 mos.     Dispo:  Return in about 3 months (around 02/10/2020) for Routine Follow Up, w/ [name], PA-C, in person.   Medication Adjustments/Labs and Tests Ordered: Current medicines are reviewed at length with the patient today.  Concerns regarding medicines are outlined above.  Tests Ordered: Orders Placed This Encounter  Procedures  . Basic Metabolic Panel (BMET)  . EKG 12-Lead   Medication Changes: Meds ordered this encounter  Medications  . sacubitril-valsartan (ENTRESTO) 49-51 MG    Sig: Take 1 tablet by mouth 2 (two) times daily.    Dispense:  60 tablet    Refill:  [id number] [name], [place]  11/12/2019 4:53 PM    [facility] [place], [place], [place]  [address] Phone: [phone]; Fax: [phone]

## 2019-11-12 ENCOUNTER — Other Ambulatory Visit: Payer: Self-pay

## 2019-11-12 ENCOUNTER — Encounter: Payer: Self-pay | Admitting: Physician Assistant

## 2019-11-12 ENCOUNTER — Ambulatory Visit: Payer: Medicare Other | Admitting: Physician Assistant

## 2019-11-12 VITALS — BP 146/80 | HR 54 | Ht 66.0 in | Wt 185.0 lb

## 2019-11-12 DIAGNOSIS — I110 Hypertensive heart disease with heart failure: Secondary | ICD-10-CM

## 2019-11-12 DIAGNOSIS — I5042 Chronic combined systolic (congestive) and diastolic (congestive) heart failure: Secondary | ICD-10-CM | POA: Diagnosis not present

## 2019-11-12 DIAGNOSIS — Z20822 Contact with and (suspected) exposure to covid-19: Secondary | ICD-10-CM | POA: Diagnosis not present

## 2019-11-12 MED ORDER — ENTRESTO 49-51 MG PO TABS: 1.0000 | ORAL_TABLET | Freq: Two times a day (BID) | ORAL | 12 refills | Status: DC

## 2019-11-12 NOTE — Patient Instructions (Addendum)
Medication Instructions:   Your physician has recommended you make the following change in your medication:   1) Increase your Entresto to 49-51MG , 1 tablet by mouth twice a day. I have reached out to our medication assistance nurse and she will be in contact with you.  *If you need a refill on your cardiac medications before your next appointment, please call your pharmacy*  Lab Work:  Your physician recommends that you return for lab work in 2 weeks on 11/23/19 at 11:30AM  If you have labs (blood work) drawn today and your tests are completely normal, you will receive your results only by: [name] MyChart Message (if you have MyChart) OR . A paper copy in the mail If you have any lab test that is abnormal or we need to change your treatment, we will call you to review the results.  Testing/Procedures:  None ordered today  Follow-Up: At [facility], you and your health needs are our priority.  As part of our continuing mission to provide you with exceptional heart care, we have created designated Provider Care Teams.  These Care Teams include your primary Cardiologist (physician) and Advanced Practice Providers (APPs -  Physician Assistants and Nurse Practitioners) who all work together to provide you with the care you need, when you need it.  Your next appointment:    On 02/11/20 at 11:45AM with [name], PA-C

## 2019-11-13 ENCOUNTER — Other Ambulatory Visit: Payer: Self-pay

## 2019-11-13 ENCOUNTER — Telehealth: Payer: Self-pay

## 2019-11-13 ENCOUNTER — Other Ambulatory Visit: Payer: Self-pay | Admitting: Family Medicine

## 2019-11-13 DIAGNOSIS — G894 Chronic pain syndrome: Secondary | ICD-10-CM

## 2019-11-13 MED ORDER — ENTRESTO 49-51 MG PO TABS: 1.0000 | ORAL_TABLET | Freq: Two times a day (BID) | ORAL | 3 refills | Status: DC

## 2019-11-13 MED ORDER — HYDROCODONE-ACETAMINOPHEN 5-325 MG PO TABS: 1.0000 | ORAL_TABLET | Freq: Four times a day (QID) | ORAL | 0 refills | Status: DC | PRN

## 2019-11-13 NOTE — Telephone Encounter (Signed)
**Note De-Identified  Obfuscation** I have sent the increased Entresto RX to [facility] Emergency planning/management officer for [employer] pt asst program) with instructions to fill ASAP as the pt will be running out of her [place] supply soon.

## 2019-11-13 NOTE — Telephone Encounter (Signed)
**Note De-identified  Obfuscation** -----  **Note De-Identified  Obfuscation** Message from Mady Haagensen, [place] sent at 11/12/2019 12:54 PM EST ----- Regarding: Donna Herrera assistance Scott increased this patients Entresto to 49-51MG  twice a day. She has patient assistance for the 24-26, and she does have enough to last her through next week.

## 2019-11-20 IMAGING — NM NUCLEAR MEDICINE THREE PHASE BONE SCAN
2 series · 12 of 12 positions shown · non-contrast
Comparison: None

Correlation: RIGHT hip radiographs 11/02/2012, CT abdomen and pelvis
10/03/2015

CLINICAL DATA: RIGHT hip and mid thigh pain for years worsened in
last 3-6 months, history of RIGHT hip arthroplasty 1538 and LEFT hip
fusion 5778

EXAM:
NUCLEAR MEDICINE 3-PHASE BONE SCAN
TECHNIQUE: Radionuclide angiographic images, immediate static blood pool
images, and 3-hour delayed static images were obtained of the hips
after intravenous injection of radiopharmaceutical.
RADIOPHARMACEUTICALS:  21.3 mCi Rc-NNm MDP IV

[Series 1: flow · 4.14mm/px · 6 of 40 frames shown (1 of 2)]
[frame 4/40  full-range]
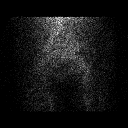
[frame 10/40  full-range]
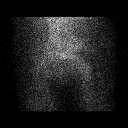
[frame 17/40  full-range]
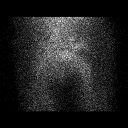
[frame 24/40  full-range]
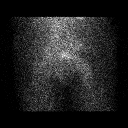
[frame 30/40  full-range]
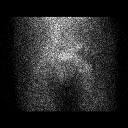
[frame 37/40  full-range]
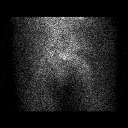

[Series 1: flow · 4.14mm/px · 6 of 40 frames shown (2 of 2)]
[frame 4/40  full-range]
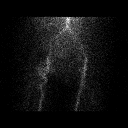
[frame 10/40  full-range]
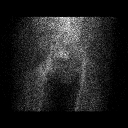
[frame 17/40  full-range]
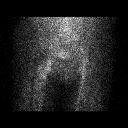
[frame 24/40  full-range]
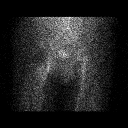
[frame 30/40  full-range]
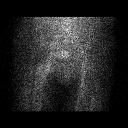
[frame 37/40  full-range]
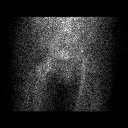

[12 of 12 positions shown; findings below may reference images not displayed]

FINDINGS: Vascular phase: Mildly increased blood flow to the medial RIGHT hip
region versus LEFT

Blood pool phase: Mildly increased blood pool medially at the RIGHT
hip the level of the lesser trochanter

Delayed phase: Focal abnormal delayed tracer uptake at the lesser
trochanteric region of the RIGHT femur, though not along the abscess
side of the femoral prosthesis nor at the shaft/tip of the
prosthesis. This pattern of uptake is nonspecific, could be seen
with stress response/stress injury at iliopsoas insertion, prior
trauma, less likely loosening.

Mild uptake at the intertrochanteric region of the LEFT femur
nonspecific but focal and likely abnormal for length of time post
fusion surgery.
IMPRESSION: Mild focal increased blood flow, blood pool and delayed uptake of
tracer at the lesser trochanteric region of the proximal RIGHT
femur, nonspecific pattern; this could reflect stress
response/injury at the ileo psoas insertion, prior trauma, less
likely loosening of the femoral component of the prosthesis.

Mild nonspecific uptake of tracer at the intertrochanteric region of
the LEFT femur post hip joint fusion; although patient is reportedly
at present asymptomatic at the LEFT hip recommend current
radiographic correlation.

## 2019-11-21 ENCOUNTER — Encounter: Payer: Self-pay | Admitting: Gastroenterology

## 2019-11-21 ENCOUNTER — Telehealth: Payer: Self-pay

## 2019-11-21 NOTE — Telephone Encounter (Signed)
Called patient and informed her that she needs to call [facility]   to make an appointment following a positive Cologuard test.  Patient was given number and address.  [address] [phone]  .Ozella Almond, CMA

## 2019-11-21 NOTE — Telephone Encounter (Signed)
-----   Message from Danna Hefty, [place] sent at 11/20/2019  5:13 PM EST ----- Regarding: GI referral/Colonoscopy Please call patient to remind her to follow up with GI for further evaluation of positive Cologuard. They attempted to contact her to schedule an appointment and was unsuccessful in reaching her. Please have her call  [facility] at [phone] to schedule an appointment. Thank you!

## 2019-11-22 ENCOUNTER — Other Ambulatory Visit: Payer: Self-pay

## 2019-11-22 ENCOUNTER — Encounter: Payer: Self-pay | Admitting: Family Medicine

## 2019-11-22 ENCOUNTER — Ambulatory Visit (INDEPENDENT_AMBULATORY_CARE_PROVIDER_SITE_OTHER): Payer: Medicare Other | Admitting: Family Medicine

## 2019-11-22 VITALS — BP 150/66 | HR 56 | Ht 66.0 in | Wt 184.0 lb

## 2019-11-22 DIAGNOSIS — I10 Essential (primary) hypertension: Secondary | ICD-10-CM | POA: Diagnosis not present

## 2019-11-22 DIAGNOSIS — M7501 Adhesive capsulitis of right shoulder: Secondary | ICD-10-CM

## 2019-11-22 DIAGNOSIS — R413 Other amnesia: Secondary | ICD-10-CM

## 2019-11-22 NOTE — Progress Notes (Signed)
   Subjective:   Patient ID: Eleasha Resto    DOB: 1945-12-05, 74 y.o. female   MRN: [id number]  Manreet Sarr is a 74 y.o. female here to discuss memory concerns.  Memory Concerns: Patient was scheduled to discuss memory but notes she doesn't feel like it is a problem anymore. She notes her daughters are not needing to repeat things. She notes she cooks for herself, bathes herself, dresses self, uses the bathroom herself. Denies any difficulty with managing her check book, making grocery lists, can go to the grocery story and shop without difficulty. Notes only occasionally puts things somewhere and forgets where they are. She does not drive, her daughters drive her. Denies any recent falls.   Elevated Blood Pressure: Initial BP 150/66, Repeat BP 145/70.  She has not started on her new increased Entresto medicine. She plans to start it today. Denies any chest pain, SOB, vision changes, or headache.   Right adhesive capsulitis: Patient notes much improvement in her right shoulder. She completed the arm exercises and is able to move her right arm normally now.   Review of Systems:  Per HPI.   [place], medications and smoking status reviewed.  Objective:   BP (!) 150/66   Pulse (!) 56   Ht 5\' 6"  (1.676 m)   Wt 184 lb (83.5 kg)   SpO2 99%   BMI 29.70 kg/m  Vitals and nursing note reviewed.  General: pleasant older AA lady, standing at bedside, well nourished, well developed, in no acute distress with non-toxic appearance Resp: Breathing comfortably on room air, speaking in full sentences Skin: warm, dry MSK: antalgic gait, uses crutches chronically, full ROM of right shouler  Neuro: Alert and oriented, speech normal  Assessment & Plan:   Adhesive capsulitis of right shoulder Resolved. Full ROM.  Essential hypertension Blood pressure elevated today. Asymptomatic. Starting new increased dose of Entresto today. Recommended patient continue to monitor blood  pressures at home. She should call [facility] or cardiologist if BP remains elevated despite new medicine.   Memory Concerns: Patient is a pleasant 74 yo AA female with PMH notable for CHF, nonischemic cardiomyopathy, HTN, chronic lung disease, GERD, DJD, HLD, tobacco abuse, and Vit. D deficiency. She initially admitted to wanting to discuss memory concerns however denies any concerns today. Appears patient is still able to complete ADLs/IADLs without difficulty. Recommended she RTC if she would like to further discuss. Can consider referral to geriatric clinic at that time. She understood and agreed to plan.    Mina Marble, DO PGY-2, [facility] 11/22/2019 12:48 PM

## 2019-11-22 NOTE — Assessment & Plan Note (Signed)
Resolved. Full ROM.

## 2019-11-22 NOTE — Assessment & Plan Note (Signed)
Blood pressure elevated today. Asymptomatic. Starting new increased dose of Entresto today. Recommended patient continue to monitor blood pressures at home. She should call [facility] or cardiologist if BP remains elevated despite new medicine.

## 2019-11-26 ENCOUNTER — Other Ambulatory Visit: Payer: Self-pay

## 2019-11-26 ENCOUNTER — Other Ambulatory Visit: Payer: Medicare Other

## 2019-11-26 DIAGNOSIS — I5042 Chronic combined systolic (congestive) and diastolic (congestive) heart failure: Secondary | ICD-10-CM

## 2019-11-27 LAB — BASIC METABOLIC PANEL
BUN/Creatinine Ratio: 17 (ref 12–28)
BUN: 16 mg/dL (ref 8–27)
CO2: 25 mmol/L (ref 20–29)
Calcium: 9.2 mg/dL (ref 8.7–10.3)
Chloride: 100 mmol/L (ref 96–106)
Creatinine, Ser: 0.93 mg/dL (ref 0.57–1.00)
GFR calc Af Amer: 70 mL/min/{1.73_m2} (ref >59)
GFR calc non Af Amer: 61 mL/min/{1.73_m2} (ref >59)
Glucose: 119 mg/dL — ABNORMAL HIGH (ref 65–99)
Potassium: 4.1 mmol/L (ref 3.5–5.2)
Sodium: 139 mmol/L (ref 134–144)

## 2019-12-04 DIAGNOSIS — G894 Chronic pain syndrome: Secondary | ICD-10-CM | POA: Diagnosis not present

## 2019-12-04 DIAGNOSIS — Z96641 Presence of right artificial hip joint: Secondary | ICD-10-CM | POA: Diagnosis not present

## 2019-12-04 DIAGNOSIS — M25551 Pain in right hip: Secondary | ICD-10-CM | POA: Diagnosis not present

## 2019-12-04 DIAGNOSIS — I428 Other cardiomyopathies: Secondary | ICD-10-CM | POA: Diagnosis not present

## 2019-12-04 DIAGNOSIS — R54 Age-related physical debility: Secondary | ICD-10-CM | POA: Diagnosis not present

## 2019-12-05 ENCOUNTER — Telehealth: Payer: Self-pay | Admitting: *Deleted

## 2019-12-05 NOTE — Telephone Encounter (Signed)
    [facility] Pre-operative Risk Assessment    Request for surgical clearance: DID LMOM TO PLEASE CLARIFY HOW MANY TEETH.   1. What type of surgery is being performed? REMOVAL OF ALL REMAINING TEETH W/ALVEOLPASTY   2. When is this surgery scheduled? TBD   3. What type of clearance is required (medical clearance vs. Pharmacy clearance to hold med vs. Both)? MEDICAL  4. Are there any medications that need to be held prior to surgery and how long? ASA  5. Practice name and name of physician performing surgery? [name], D.M.D., P.A.   6. What is your office phone number [phone]    7.   What is your office fax number [phone]  8.   Anesthesia type (None, local, MAC, general) ? GENERAL   Julaine Hua 12/05/2019, 4:46 PM  _________________________________________________________________   (provider comments below)

## 2019-12-06 NOTE — Telephone Encounter (Signed)
   Primary Cardiologist: Dorris Carnes, MD  [name], [facility] 11/13/2019  Will route to Dr Harrington Challenger to address holding ASA up to 5 days for procedure.   Pt was doing well when seen 01/13, stable from cardiac standpoint.   Chart reviewed as part of pre-operative protocol coverage. Given past medical history and time since last visit, based on ACC/AHA guidelines, Avalei Beld would be at acceptable risk for the planned procedure without further cardiovascular testing.   Once information on holding ASA received, route this recommendation to the requesting party via Epic fax function and remove from pre-op pool.  Please call with questions.  Rosaria Ferries, PA-C 12/06/2019, 5:54 PM

## 2019-12-08 NOTE — Telephone Encounter (Signed)
OK to hold ASA 5 days prior   Resume when safe after surgery

## 2019-12-09 NOTE — Telephone Encounter (Signed)
   Primary Cardiologist: Dorris Carnes, MD  Chart reviewed as part of pre-operative protocol coverage. Patient was last seen on 11/12/2019 and was stable from a cardiac standpoint at that time.   Per note from Rosaria Ferries, PA-C, on 12/06/2019: Given past medical history and time since last visit, based on ACC/AHA guidelines, Donna Herrera would be at acceptable risk for the planned procedure without further cardiovascular testing.   Per Dr. Harrington Challenger: OK to hold ASA 5 days prior. Resume when safe after surgery.  I will route this recommendation to the requesting party via Epic fax function and remove from pre-op pool.  Please call with questions.  Darreld Mclean, PA-C 12/09/2019, 12:24 PM

## 2019-12-10 ENCOUNTER — Other Ambulatory Visit: Payer: Self-pay

## 2019-12-10 ENCOUNTER — Ambulatory Visit (AMBULATORY_SURGERY_CENTER): Payer: Self-pay | Admitting: *Deleted

## 2019-12-10 VITALS — Ht 66.0 in | Wt 184.0 lb

## 2019-12-10 DIAGNOSIS — R195 Other fecal abnormalities: Secondary | ICD-10-CM

## 2019-12-10 DIAGNOSIS — Z01818 Encounter for other preprocedural examination: Secondary | ICD-10-CM

## 2019-12-10 MED ORDER — NA SULFATE-K SULFATE-MG SULF 17.5-3.13-1.6 GM/177ML PO SOLN: ORAL | 0 refills | Status: DC

## 2019-12-10 NOTE — Progress Notes (Signed)
Patient is here in-person for PV. Patient's daughter in law with her in PV =temp 96.6. Patient denies any allergies to eggs or soy. Patient denies any problems with anesthesia/sedation. Patient denies any oxygen use at home. Patient denies taking any diet/weight loss medications or blood thinners. Patient is not being treated for MRSA or C-diff. EMMI education assisgned to the patient for the procedure, this was explained and instructions given to patient. COVID-19 screening test is on 2/18, the pt is aware. Pt is aware that care partner will wait in the car during procedure; if they feel like they will be too hot or cold to wait in the car; they may wait in the 4 th floor lobby. Patient is aware to bring only one care partner. We want them to wear a mask (we do not have any that we can provide them), practice social distancing, and we will check their temperatures when they get here.  I did remind the patient that their care partner needs to stay in the parking lot the entire time and have a cell phone available, we will call them when the pt is ready for discharge. Patient will wear mask into building.

## 2019-12-11 ENCOUNTER — Telehealth: Payer: Self-pay | Admitting: Internal Medicine

## 2019-12-11 NOTE — Telephone Encounter (Signed)
I spoke to the patient who called because her Donna Herrera was doubled on 1/12 at the [place] with [place].  Shortly after that, her BP has dropped to 90/? and 105/80, her recollection,  with symptoms of dizziness and light headedness.  She was wondering if something else could be prescribed or further advisement.

## 2019-12-11 NOTE — Telephone Encounter (Signed)
Decrease Entresto back to 24/26 mg twice daily. Monitor BP x 2 weeks and send readings for review. [name], PA-C    12/11/2019 5:29 PM

## 2019-12-11 NOTE — Telephone Encounter (Signed)
Pt c/o medication issue:  1. Name of Medication: sacubitril-valsartan (ENTRESTO) 49-51 MG  2. How are you currently taking this medication (dosage and times per day)? One tablet twice daily   3. Are you having a reaction (difficulty breathing--STAT)? Dizziness, low bp, lightheaded, cold feeling In hands  4. What is your medication issue? Pt is worried the increase in her recent dosage is causing these issues  Pt did not experience this when she was on entresto 24-26 mg twice daily. She had issues with her BP rising, so she went to see [name], and he doubled the dosage. Now that the dose is higher she is having these issues

## 2019-12-12 MED ORDER — ENTRESTO 24-26 MG PO TABS: 1.0000 | ORAL_TABLET | Freq: Two times a day (BID) | ORAL | 11 refills | Status: DC

## 2019-12-12 NOTE — Telephone Encounter (Signed)
Spoke with pt and went over recommendations.  Pt asked about cost.  Advised I will have to send to pharmacy and they will run it before we would know cost.  Advised if too expensive, please contact the office and let us know.  Pt verbalized understanding.

## 2019-12-19 ENCOUNTER — Encounter: Payer: Self-pay | Admitting: Gastroenterology

## 2019-12-24 ENCOUNTER — Encounter: Payer: Medicare Other | Admitting: Gastroenterology

## 2019-12-24 ENCOUNTER — Other Ambulatory Visit: Payer: Self-pay

## 2019-12-24 DIAGNOSIS — I5042 Chronic combined systolic (congestive) and diastolic (congestive) heart failure: Secondary | ICD-10-CM

## 2019-12-24 MED ORDER — SPIRONOLACTONE 25 MG PO TABS: 25.0000 mg | ORAL_TABLET | Freq: Every day | ORAL | 2 refills | Status: DC

## 2020-01-01 DIAGNOSIS — M25551 Pain in right hip: Secondary | ICD-10-CM | POA: Diagnosis not present

## 2020-01-01 DIAGNOSIS — I428 Other cardiomyopathies: Secondary | ICD-10-CM | POA: Diagnosis not present

## 2020-01-01 DIAGNOSIS — G894 Chronic pain syndrome: Secondary | ICD-10-CM | POA: Diagnosis not present

## 2020-01-01 DIAGNOSIS — R54 Age-related physical debility: Secondary | ICD-10-CM | POA: Diagnosis not present

## 2020-01-01 DIAGNOSIS — Z96641 Presence of right artificial hip joint: Secondary | ICD-10-CM | POA: Diagnosis not present

## 2020-01-10 ENCOUNTER — Other Ambulatory Visit: Payer: Self-pay

## 2020-01-13 ENCOUNTER — Other Ambulatory Visit: Payer: Self-pay | Admitting: Gastroenterology

## 2020-01-13 ENCOUNTER — Ambulatory Visit (INDEPENDENT_AMBULATORY_CARE_PROVIDER_SITE_OTHER): Payer: Medicare Other

## 2020-01-13 ENCOUNTER — Other Ambulatory Visit: Payer: Self-pay

## 2020-01-13 DIAGNOSIS — Z1159 Encounter for screening for other viral diseases: Secondary | ICD-10-CM | POA: Diagnosis not present

## 2020-01-13 LAB — SARS CORONAVIRUS 2 (TAT 6-24 HRS): SARS Coronavirus 2: NEGATIVE

## 2020-01-13 MED ORDER — HYDROCODONE-ACETAMINOPHEN 5-325 MG PO TABS: 1.0000 | ORAL_TABLET | Freq: Four times a day (QID) | ORAL | 0 refills | Status: DC | PRN

## 2020-01-16 ENCOUNTER — Ambulatory Visit (AMBULATORY_SURGERY_CENTER): Payer: Medicare Other | Admitting: Gastroenterology

## 2020-01-16 ENCOUNTER — Encounter: Payer: Self-pay | Admitting: Gastroenterology

## 2020-01-16 ENCOUNTER — Other Ambulatory Visit: Payer: Self-pay

## 2020-01-16 VITALS — BP 150/71 | HR 63 | Temp 96.9°F | Resp 15 | Ht 66.0 in | Wt 184.0 lb

## 2020-01-16 DIAGNOSIS — D124 Benign neoplasm of descending colon: Secondary | ICD-10-CM

## 2020-01-16 DIAGNOSIS — R195 Other fecal abnormalities: Secondary | ICD-10-CM

## 2020-01-16 DIAGNOSIS — Z1211 Encounter for screening for malignant neoplasm of colon: Secondary | ICD-10-CM | POA: Diagnosis not present

## 2020-01-16 DIAGNOSIS — K5730 Diverticulosis of large intestine without perforation or abscess without bleeding: Secondary | ICD-10-CM | POA: Diagnosis not present

## 2020-01-16 DIAGNOSIS — D123 Benign neoplasm of transverse colon: Secondary | ICD-10-CM

## 2020-01-16 MED ORDER — SODIUM CHLORIDE 0.9 % IV SOLN: 500.0000 mL | Freq: Once | INTRAVENOUS | Status: DC

## 2020-01-16 NOTE — Op Note (Signed)
[place] Patient Name: Donna Herrera Procedure Date: 01/16/2020 12:07 PM MRN: [id number] Endoscopist: Mallie Mussel L. [name] , MD Age: 74 Referring MD:  Date of Birth: 01-21-1946 Gender: Female Account #: [id number] Procedure:                Colonoscopy Indications:              Positive Cologuard test Medicines:                Monitored Anesthesia Care Procedure:                Pre-Anesthesia Assessment:                           - Prior to the procedure, a History and Physical                            was performed, and patient medications and                            allergies were reviewed. The patient's tolerance of                            previous anesthesia was also reviewed. The risks                            and benefits of the procedure and the sedation                            options and risks were discussed with the patient.                            All questions were answered, and informed consent                            was obtained. Prior Anticoagulants: The patient has                            taken no previous anticoagulant or antiplatelet                            agents except for aspirin. ASA Grade Assessment:                            III - A patient with severe systemic disease. After                            reviewing the risks and benefits, the patient was                            deemed in satisfactory condition to undergo the                            procedure.  After obtaining informed consent, the colonoscope                            was passed under direct vision. Throughout the                            procedure, the patient's blood pressure, pulse, and                            oxygen saturations were monitored continuously. The                            Colonoscope was introduced through the anus and                            advanced to the the cecum, identified by                 appendiceal orifice and ileocecal valve. The                            colonoscopy was performed without difficulty. The                            patient tolerated the procedure well. The quality                            of the bowel preparation was good. The ileocecal                            valve, appendiceal orifice, and rectum were                            photographed. The quality of the bowel preparation                            was evaluated using the BBPS [facility] Bowel                            Preparation Scale) with scores of: Right Colon = 2,                            Transverse Colon = 2 and Left Colon = 2. The total                            BBPS score equals 6. Scope In: 12:13:06 PM Scope Out: 12:34:19 PM Scope Withdrawal Time: 0 hours 15 minutes 32 seconds  Total Procedure Duration: 0 hours 21 minutes 13 seconds  Findings:                 The digital rectal exam findings include decreased                            sphincter tone.  Multiple diverticula were found in the left colon.                           Two sessile polyps were found in the descending                            colon and transverse colon. The polyps were 3 to 12                            mm in size. These polyps were removed with a cold                            snare. Resection and retrieval were complete.                           Retroflexion in the rectum was not performed due to                            anatomy.                           The exam was otherwise without abnormality. Complications:            No immediate complications. Estimated Blood Loss:     Estimated blood loss was minimal. Impression:               - Decreased sphincter tone found on digital rectal                            exam.                           - Diverticulosis in the left colon.                           - Two 3 to 12 mm polyps in the descending colon and                             in the transverse colon, removed with a cold snare.                            Resected and retrieved.                           - The examination was otherwise normal. Recommendation:           - Patient has a contact number available for                            emergencies. The signs and symptoms of potential                            delayed complications were discussed with the  patient. Return to normal activities tomorrow.                            Written discharge instructions were provided to the                            patient.                           - Resume previous diet.                           - Continue present medications.                           - Await pathology results.                           - Based on current guidelines, age, medical                            condition and today's findings, no repeat                            colonoscopy or other colon cancer screening (such                            as cologuard of FOBT) necessary in the future. Beth Goodlin L. [name], MD 01/16/2020 12:40:34 PM This report has been signed electronically.

## 2020-01-16 NOTE — Progress Notes (Signed)
Called to room to assist during endoscopic procedure.  Patient ID and intended procedure confirmed with present staff. Received instructions for my participation in the procedure from the performing physician.  

## 2020-01-16 NOTE — Progress Notes (Signed)
Temperature taken by J.B., VS taken by D.T. 

## 2020-01-16 NOTE — Patient Instructions (Signed)
Please, read all of your handouts given to you by your recovery room nurse.  Thank-you for choosing [name] for your healthcare needs today.  YOU HAD AN ENDOSCOPIC PROCEDURE TODAY AT [facility]:   Refer to the procedure report that was given to you for any specific questions about what was found during the examination.  If the procedure report does not answer your questions, please call your gastroenterologist to clarify.  If you requested that your care partner not be given the details of your procedure findings, then the procedure report has been included in a sealed envelope for you to review at your convenience later.  YOU SHOULD EXPECT: Some feelings of bloating in the abdomen. Passage of more gas than usual.  Walking can help get rid of the air that was put into your GI tract during the procedure and reduce the bloating. If you had a lower endoscopy (such as a colonoscopy or flexible sigmoidoscopy) you may notice spotting of blood in your stool or on the toilet paper. If you underwent a bowel prep for your procedure, you may not have a normal bowel movement for a few days.  Please Note:  You might notice some irritation and congestion in your nose or some drainage.  This is from the oxygen used during your procedure.  There is no need for concern and it should clear up in a day or so.  SYMPTOMS TO REPORT IMMEDIATELY:   Following lower endoscopy (colonoscopy or flexible sigmoidoscopy):  Excessive amounts of blood in the stool  Significant tenderness or worsening of abdominal pains  Swelling of the abdomen that is new, acute  Fever of 100F or higher  For urgent or emergent issues, a gastroenterologist can be reached at any hour by calling [phone]. Do not use MyChart messaging for urgent concerns.    DIET:  We do recommend a small meal at first, but then you may proceed to your regular diet.  Drink plenty of fluids but you should avoid alcoholic beverages for 24  hours.  ACTIVITY:  You should plan to take it easy for the rest of today and you should NOT DRIVE or use heavy machinery until tomorrow (because of the sedation medicines used during the test).    FOLLOW UP: Our staff will call the number listed on your records 48-72 hours following your procedure to check on you and address any questions or concerns that you may have regarding the information given to you following your procedure. If we do not reach you, we will leave a message.  We will attempt to reach you two times.  During this call, we will ask if you have developed any symptoms of COVID 19. If you develop any symptoms (ie: fever, flu-like symptoms, shortness of breath, cough etc.) before then, please call [phone].  If you test positive for Covid 19 in the 2 weeks post procedure, please call and report this information to [name].    If any biopsies were taken you will be contacted by phone or by letter within the next 1-3 weeks.  Please call us at [phone] if you have not heard about the biopsies in 3 weeks.    SIGNATURES/CONFIDENTIALITY: You and/or your care partner have signed paperwork which will be entered into your electronic medical record.  These signatures attest to the fact that that the information above on your After Visit Summary has been reviewed and is understood.  Full responsibility of the confidentiality of this discharge information  lies with you and/or your care-partner.

## 2020-01-16 NOTE — Progress Notes (Signed)
Pt's states no medical or surgical changes since previsit or office visit. 

## 2020-01-16 NOTE — Progress Notes (Signed)
To PACU, VSS. Report to [email or web address] 

## 2020-01-19 ENCOUNTER — Other Ambulatory Visit: Payer: Self-pay | Admitting: Family Medicine

## 2020-01-19 DIAGNOSIS — G894 Chronic pain syndrome: Secondary | ICD-10-CM

## 2020-01-20 ENCOUNTER — Telehealth: Payer: Self-pay | Admitting: *Deleted

## 2020-01-20 NOTE — Telephone Encounter (Signed)
  Follow up Call-  Call back number 01/16/2020  Post procedure Call Back phone  # [phone]  Permission to leave phone message Yes  Some recent data might be hidden     Patient questions:  Do you have a fever, pain , or abdominal swelling? No. Pain Score  0 *  Have you tolerated food without any problems? Yes.    Have you been able to return to your normal activities? Yes.    Do you have any questions about your discharge instructions: Diet   No. Medications  No. Follow up visit  No.  Do you have questions or concerns about your Care? No.  Actions: * If pain score is 4 or above: No action needed, pain <4.  1. Have you developed a fever since your procedure? no  2.   Have you had an respiratory symptoms (SOB or cough) since your procedure? no  3.   Have you tested positive for COVID 19 since your procedure no  4.   Have you had any family members/close contacts diagnosed with the COVID 19 since your procedure?  no   If yes to any of these questions please route to Joylene John, RN and Alphonsa Gin, Therapist, sports.

## 2020-01-21 ENCOUNTER — Encounter: Payer: Self-pay | Admitting: Gastroenterology

## 2020-02-01 DIAGNOSIS — Z96641 Presence of right artificial hip joint: Secondary | ICD-10-CM | POA: Diagnosis not present

## 2020-02-01 DIAGNOSIS — I428 Other cardiomyopathies: Secondary | ICD-10-CM | POA: Diagnosis not present

## 2020-02-01 DIAGNOSIS — R54 Age-related physical debility: Secondary | ICD-10-CM | POA: Diagnosis not present

## 2020-02-01 DIAGNOSIS — G894 Chronic pain syndrome: Secondary | ICD-10-CM | POA: Diagnosis not present

## 2020-02-01 DIAGNOSIS — M25551 Pain in right hip: Secondary | ICD-10-CM | POA: Diagnosis not present

## 2020-02-11 ENCOUNTER — Other Ambulatory Visit: Payer: Self-pay

## 2020-02-11 ENCOUNTER — Encounter: Payer: Self-pay | Admitting: Physician Assistant

## 2020-02-11 ENCOUNTER — Ambulatory Visit: Payer: Medicare Other | Admitting: Physician Assistant

## 2020-02-11 VITALS — BP 130/60 | HR 66 | Ht 63.0 in | Wt 187.0 lb

## 2020-02-11 DIAGNOSIS — I110 Hypertensive heart disease with heart failure: Secondary | ICD-10-CM

## 2020-02-11 DIAGNOSIS — R072 Precordial pain: Secondary | ICD-10-CM

## 2020-02-11 DIAGNOSIS — I5043 Acute on chronic combined systolic (congestive) and diastolic (congestive) heart failure: Secondary | ICD-10-CM | POA: Diagnosis not present

## 2020-02-11 DIAGNOSIS — I5042 Chronic combined systolic (congestive) and diastolic (congestive) heart failure: Secondary | ICD-10-CM

## 2020-02-11 NOTE — Patient Instructions (Addendum)
Medication Instructions:   Your physician has recommended you make the following change in your medication:   1) Increase Lasix to 40 mg, 2 tablets by mouth once a day for 5 days. After the 5 days go back to regular dose. 2) Increase Potassium to 40 mEq, 2 tablets by mouth once a day for 5 days. After the 5 days go back to regular dose.  *If you need a refill on your cardiac medications before your next appointment, please call your pharmacy*  Lab Work:  You will have labs drawn today: BMET  If you have labs (blood work) drawn today and your tests are completely normal, you will receive your results only by: [name] MyChart Message (if you have MyChart) OR . A paper copy in the mail If you have any lab test that is abnormal or we need to change your treatment, we will call you to review the results.  Testing/Procedures:  None ordered today  Follow-Up: At [facility], you and your health needs are our priority.  As part of our continuing mission to provide you with exceptional heart care, we have created designated Provider Care Teams.  These Care Teams include your primary Cardiologist (physician) and Advanced Practice Providers (APPs -  Physician Assistants and Nurse Practitioners) who all work together to provide you with the care you need, when you need it.  We recommend signing up for the patient portal called "MyChart".  Sign up information is provided on this After Visit Summary.  MyChart is used to connect with patients for Virtual Visits (Telemedicine).  Patients are able to view lab/test results, encounter notes, upcoming appointments, etc.  Non-urgent messages can be sent to your provider as well.   To learn more about what you can do with MyChart, go to [email or web address].    Your next appointment:    On 02/26/20 at 12:15PM with [name], PA-C. This will be a virtual visit.   Other Instructions  Two Gram Sodium Diet 2000 mg  What is Sodium? Sodium is a mineral  found naturally in many foods. The most significant source of sodium in the diet is table salt, which is about 40% sodium.  Processed, convenience, and preserved foods also contain a large amount of sodium.  The body needs only 500 mg of sodium daily to function,  A normal diet provides more than enough sodium even if you do not use salt.  Why Limit Sodium? A build up of sodium in the body can cause thirst, increased blood pressure, shortness of breath, and water retention.  Decreasing sodium in the diet can reduce edema and risk of heart attack or stroke associated with high blood pressure.  Keep in mind that there are many other factors involved in these health problems.  Heredity, obesity, lack of exercise, cigarette smoking, stress and what you eat all play a role.  General Guidelines:  Do not add salt at the table or in cooking.  One teaspoon of salt contains over 2 grams of sodium.  Read food labels  Avoid processed and convenience foods  Ask your dietitian before eating any foods not dicussed in the menu planning guidelines  Consult your physician if you wish to use a salt substitute or a sodium containing medication such as antacids.  Limit milk and milk products to 16 oz (2 cups) per day.  Shopping Hints:  READ LABELS!! "Dietetic" does not necessarily mean low sodium.  Salt and other sodium ingredients are often added to foods during processing.  Menu Planning Guidelines Food Group Choose More Often Avoid  Beverages (see also the milk group All fruit juices, low-sodium, salt-free vegetables juices, low-sodium carbonated beverages Regular vegetable or tomato juices, commercially softened water used for drinking or cooking  Breads and Cereals Enriched white, wheat, rye and pumpernickel bread, hard rolls and dinner rolls; muffins, cornbread and waffles; most dry cereals, cooked cereal without added salt; unsalted crackers and breadsticks; low sodium or homemade bread crumbs Bread,  rolls and crackers with salted tops; quick breads; instant hot cereals; pancakes; commercial bread stuffing; self-rising flower and biscuit mixes; regular bread crumbs or cracker crumbs  Desserts and Sweets Desserts and sweets mad with mild should be within allowance Instant pudding mixes and cake mixes  Fats Butter or margarine; vegetable oils; unsalted salad dressings, regular salad dressings limited to 1 Tbs; light, sour and heavy cream Regular salad dressings containing bacon fat, bacon bits, and salt pork; snack dips made with instant soup mixes or processed cheese; salted nuts  Fruits Most fresh, frozen and canned fruits Fruits processed with salt or sodium-containing ingredient (some dried fruits are processed with sodium sulfites        Vegetables Fresh, frozen vegetables and low- sodium canned vegetables Regular canned vegetables, sauerkraut, pickled vegetables, and others prepared in brine; frozen vegetables in sauces; vegetables seasoned with ham, bacon or salt pork  Condiments, Sauces, Miscellaneous  Salt substitute with physician's approval; pepper, herbs, spices; vinegar, lemon or lime juice; hot pepper sauce; garlic powder, onion powder, low sodium soy sauce (1 Tbs.); low sodium condiments (ketchup, chili sauce, mustard) in limited amounts (1 tsp.) fresh ground horseradish; unsalted tortilla chips, pretzels, potato chips, popcorn, salsa (1/4 cup) Any seasoning made with salt including garlic salt, celery salt, onion salt, and seasoned salt; sea salt, rock salt, kosher salt; meat tenderizers; monosodium glutamate; mustard, regular soy sauce, barbecue, sauce, chili sauce, teriyaki sauce, steak sauce, Worcestershire sauce, and most flavored vinegars; canned gravy and mixes; regular condiments; salted snack foods, olives, picles, relish, horseradish sauce, catsup   Food preparation: Try these seasonings Meats:    Pork Sage, onion Serve with applesauce  Chicken Poultry seasoning, thyme,  parsley Serve with cranberry sauce  Lamb Curry powder, rosemary, garlic, thyme Serve with mint sauce or jelly  Veal Marjoram, basil Serve with current jelly, cranberry sauce  Beef Pepper, bay leaf Serve with dry mustard, unsalted chive butter  Fish [place] leaf, dill Serve with unsalted lemon butter, unsalted parsley butter  Vegetables:    Asparagus Lemon juice   Broccoli Lemon juice   Carrots Mustard dressing parsley, mint, nutmeg, glazed with unsalted butter and sugar   Green beans Marjoram, lemon juice, nutmeg,dill seed   Tomatoes Basil, marjoram, onion   Spice /blend for [employer]" 4 tsp ground thyme 1 tsp ground sage 3 tsp ground rosemary 4 tsp ground marjoram   Test your knowledge 1. A product that says "Salt Free" may still contain sodium. True or False 2. Garlic Powder and Hot Pepper Sauce an be used as alternative seasonings.True or False 3. Processed foods have more sodium than fresh foods.  True or False 4. Canned Vegetables have less sodium than froze True or False  WAYS TO DECREASE YOUR SODIUM INTAKE 1. Avoid the use of added salt in cooking and at the table.  Table salt (and other prepared seasonings which contain salt) is probably one of the greatest sources of sodium in the diet.  Unsalted foods can gain flavor from the sweet, sour, and butter taste  sensations of herbs and spices.  Instead of using salt for seasoning, try the following seasonings with the foods listed.  Remember: how you use them to enhance natural food flavors is limited only by your creativity... Allspice-Meat, fish, eggs, fruit, peas, red and yellow vegetables Almond Extract-Fruit baked goods Anise Seed-Sweet breads, fruit, carrots, beets, cottage cheese, cookies (tastes like licorice) Basil-Meat, fish, eggs, vegetables, rice, vegetables salads, soups, sauces Bay Leaf-Meat, fish, stews, poultry Burnet-Salad, vegetables (cucumber-like flavor) Caraway Seed-Bread, cookies, cottage cheese, meat, vegetables,  cheese, rice Cardamon-Baked goods, fruit, soups Celery Powder or seed-Salads, salad dressings, sauces, meatloaf, soup, bread.Do not use  celery salt Chervil-Meats, salads, fish, eggs, vegetables, cottage cheese (parsley-like flavor) Chili Power-Meatloaf, chicken cheese, corn, eggplant, egg dishes Chives-Salads cottage cheese, egg dishes, soups, vegetables, sauces Cilantro-Salsa, casseroles Cinnamon-Baked goods, fruit, pork, lamb, chicken, carrots Cloves-Fruit, baked goods, fish, pot roast, green beans, beets, carrots Coriander-Pastry, cookies, meat, salads, cheese (lemon-orange flavor) Cumin-Meatloaf, fish,cheese, eggs, cabbage,fruit pie (caraway flavor) [employer], fruit, eggs, fish, poultry, cottage cheese, vegetables Dill Seed-Meat, cottage cheese, poultry, vegetables, fish, salads, bread Fennel Seed-Bread, cookies, apples, pork, eggs, fish, beets, cabbage, cheese, Licorice-like flavor Garlic-(buds or powder) Salads, meat, poultry, fish, bread, butter, vegetables, potatoes.Do not  use garlic salt Ginger-Fruit, vegetables, baked goods, meat, fish, poultry Horseradish Root-Meet, vegetables, butter Lemon Juice or Extract-Vegetables, fruit, tea, baked goods, fish salads Mace-Baked goods fruit, vegetables, fish, poultry (taste like nutmeg) Maple Extract-Syrups Marjoram-Meat, chicken, fish, vegetables, breads, green salads (taste like Sage) Mint-Tea, lamb, sherbet, vegetables, desserts, carrots, cabbage Mustard, Dry or Seed-Cheese, eggs, meats, vegetables, poultry Nutmeg-Baked goods, fruit, chicken, eggs, vegetables, desserts Onion Powder-Meat, fish, poultry, vegetables, cheese, eggs, bread, rice salads (Do not use   Onion salt) Orange Extract-Desserts, baked goods Oregano-Pasta, eggs, cheese, onions, pork, lamb, fish, chicken, vegetables, green salads Paprika-Meat, fish, poultry, eggs, cheese, vegetables Parsley Flakes-Butter, vegetables, meat fish, poultry, eggs, bread, salads  (certain forms may   Contain sodium Pepper-Meat fish, poultry, vegetables, eggs Peppermint Extract-Desserts, baked goods Poppy Seed-Eggs, bread, cheese, fruit dressings, baked goods, noodles, vegetables, cottage  [employer], poultry, meat, fish, cauliflower, turnips,eggs bread Saffron-Rice, bread, veal, chicken, fish, eggs Sage-Meat, fish, poultry, onions, eggplant, tomateos, pork, stews Savory-Eggs, salads, poultry, meat, rice, vegetables, soups, pork Tarragon-Meat, poultry, fish, eggs, butter, vegetables (licorice-like flavor)  Thyme-Meat, poultry, fish, eggs, vegetables, (clover-like flavor), sauces, soups Tumeric-Salads, butter, eggs, fish, rice, vegetables (saffron-like flavor) Vanilla Extract-Baked goods, candy Vinegar-Salads, vegetables, meat marinades Walnut Extract-baked goods, candy  2. Choose your Foods Wisely   The following is a list of foods to avoid which are high in sodium:  Meats-Avoid all smoked, canned, salt cured, dried and kosher meat and fish as well as Anchovies   [employer] meats:Bologna, Liverwurst, Pastrami Canned meat or fish  Marinated herring Caviar    Pepperoni Corned Beef   Pizza Dried chipped beef  Salami Frozen breaded fish or meat Salt pork Frankfurters or hot dogs  Sardines Gefilte fish   Sausage Ham (boiled ham, Proscuitto Smoked butt    spiced ham)   Spam      TV Dinners Vegetables Canned vegetables (Regular) Relish Canned mushrooms  Sauerkraut Olives    Tomato juice Pickles  Bakery and Dessert Products Canned puddings  Cream pies Cheesecake   Decorated cakes Cookies  Beverages/Juices Tomato juice, regular  Gatorade   V-8 vegetable juice, regular  Breads and Cereals Biscuit mixes   Salted potato chips, corn chips, pretzels Bread stuffing mixes  Salted crackers and  rolls Pancake and waffle mixes Self-rising flour  Seasonings Accent    Meat sauces Barbecue sauce  Meat  tenderizer Catsup    Monosodium glutamate (MSG) Celery salt   Onion salt Chili sauce   Prepared mustard Garlic salt   Salt, seasoned salt, sea salt Gravy mixes   Soy sauce Horseradish   Steak sauce Ketchup   Tartar sauce Lite salt    Teriyaki sauce Marinade mixes   Worcestershire sauce  Others Baking powder   Cocoa and cocoa mixes Baking soda   Commercial casserole mixes Candy-caramels, chocolate  Dehydrated soups    Bars, fudge,nougats  Instant rice and pasta mixes Canned broth or soup  Maraschino cherries Cheese, aged and processed cheese and cheese spreads  Learning Assessment Quiz  Indicated T (for True) or F (for False) for each of the following statements:  1. _____ Fresh fruits and vegetables and unprocessed grains are generally low in sodium 2. _____ Water may contain a considerable amount of sodium, depending on the source 3. _____ You can always tell if a food is high in sodium by tasting it 4. _____ Certain laxatives my be high in sodium and should be avoided unless prescribed   by a physician or pharmacist 5. _____ Salt substitutes may be used freely by anyone on a sodium restricted diet 6. _____ Sodium is present in table salt, food additives and as a natural component of   most foods 7. _____ Table salt is approximately 90% sodium 8. _____ Limiting sodium intake may help prevent excess fluid accumulation in the body 9. _____ On a sodium-restricted diet, seasonings such as bouillon soy sauce, and    cooking wine should be used in place of table salt 10. _____ On an ingredient list, a product which lists monosodium glutamate as the first   ingredient is an appropriate food to include on a low sodium diet  Circle the best answer(s) to the following statements (Hint: there may be more than one correct answer)  11. On a low-sodium diet, some acceptable snack items are:    A. Olives  F. Bean dip   K. Grapefruit juice    B. Salted Pretzels G. Commercial Popcorn   L.  Canned peaches    C. Carrot Sticks  H. Bouillon   M. Unsalted nuts   D. [place] fries  I. Peanut butter crackers N. Salami   E. Sweet pickles J. Tomato Juice   O. Pizza  12.  Seasonings that may be used freely on a reduced - sodium diet include   A. Lemon wedges F.Monosodium glutamate K. Celery seed    B.Soysauce   G. Pepper   L. Mustard powder   C. Sea salt  H. Cooking wine  M. Onion flakes   D. Vinegar  E. Prepared horseradish N. Salsa   E. Sage   J. Worcestershire sauce  O. Chutney

## 2020-02-11 NOTE — Progress Notes (Signed)
Cardiology Office Note:    Date:  02/11/2020   ID:  Donna Herrera, DOB July 10, 1946, MRN [id number]  PCP:  Danna Hefty, DO  Cardiologist:  Dorris Carnes, MD  Electrophysiologist:  None   Referring MD: Danna Hefty, DO   Chief Complaint:  Follow-up (CHF)    Patient Profile:    Donna Herrera is a 74 y.o. female with:   Chronic systolic CHF ? Echo 5/19: EF 15-20 ? Echo 8/19: EF 40-45  Non-Ischemic CM  Tobacco use  Hypertension   Hyperlipidemia  Slipped Capital Femoral Epiphysis syndrome ? S/p multiple hip procedures  Prior CV studies: Echo 06/06/18 Mod LVH, EF 40-45, diff HK, Gr 1 DD, MAC, trivial MR, mild LAE, trivial TR  Echo 03/06/2018 EF 15-20, diffuse HK, grade 2 diastolic dysfunction, trivial AI, MAC, trivial MR, moderate LAE, normal RVSF, mild TR, PASP 62  LHC 05/11/16 1. Normal coronary anatomy 2. Severe LV dysfunction with EF 30-35%. 3. Normal right heart and LV filling pressures.  Echo 10/05/15 Mild LVH, EF 35-40%, diffuse HK, grade 2 diastolic dysfunction, MAC, mild MR, moderate LAE, normal RVSF, PASP 49 mmHg  Echo 01/16/15 EF 40%, diffuse HK, mild MR, moderate LAE, mild RAE, PASP 42 mmHg  History of Present Illness:    Donna Herrera was last seen in January 2021.  Her Entresto dose was adjusted secondary to elevated blood pressure.  However, she could not tolerate this due to hypotension.  She returns for follow up.   She is here with her daughter in law.  She has recently noted increased shortness of breath and a dry cough with lying flat.  She has had to increase the incline of her bed (orthopnea) and has had paroxysmal nocturnal dyspnea.  She feels her legs are more swollen.  She has an occasional chest pain described as a "cramp".  She has not had syncope.  She does admit to a diet high in salt.  Her weight is up 7 lbs at home.    Past Medical History:  Diagnosis Date  . Allergy   . Arthritis   . Candidiasis, mouth  10/19/2017  . Chronic combined systolic and diastolic CHF (congestive heart failure) ([place]) 10/21/2015   Echo 3/16: EF 40%, diffuse HK, mild MR, moderate LAE, mild RAE, PASP 42 mmHg  //  Echo 12/16: Mild LVH, EF 35-40%, diffuse HK, Gr 2 DD, MAC, mild MR, mod LAE, normal RVSF, PASP 49 mmHg // Echo 5/19: EF 15-20, diff HK, Gr 2 DD, trivial AI, MAC, trivial MR, mod LAE, normal RVSF, mild TR, PASP 62 // Limited Echo 8/19: mod LVH, EF 40-45, diff HK, Gr 1 DD, MAC, mild LAE   . Chronic left shoulder pain 04/10/2013  . Chronic lung disease   . Chronic pain   . Chronic pain syndrome 07/20/2017  . Depression 08/25/2016  . DJD (degenerative joint disease) 10/12/2012   Multiple joint replacements by Dr. Collier Salina.   . Dyshidrotic eczema 12/31/2014  . Essential hypertension 07/20/2017  . GERD (gastroesophageal reflux disease)   . Gout   . H/O slipped capital femoral epiphysis (SCFE)   . History of total right hip replacement 04/02/2019   Followed closely with orthopedist Dr. Alvan Dame. Patient is s/p total right hip replacement in 1980's with evidence of bone loss and osteopenia making reconstruction unlikely. Plan for conservative treatment at this time with crutches for ambulation and pain control.  . Hyperlipidemia   . Hypertension   . Hypertensive heart disease with CHF (congestive heart  failure) ([place]) 10/12/2012  . Loss of taste 02/06/2018  . Mass of breast, left 07/21/2014  . NICM (nonischemic cardiomyopathy) ([place]) 06/09/2016   A. LHC 7/17: Normal coronary arteries, EF 30-35%, LVEDP 14 mmHg  . Pain in gums 04/18/2018  . Right hip pain 11/02/2012  . Tendonitis, Achilles, left 05/24/2017  . Tobacco abuse 09/29/2014  . Vitamin D deficiency 12/31/2014    Current Medications: Current Meds  Medication Sig  . albuterol (PROVENTIL HFA;VENTOLIN HFA) 108 (90 Base) MCG/ACT inhaler Inhale 1-2 puffs into the lungs every 6 (six) hours as needed for wheezing.  [name] allopurinol (ZYLOPRIM) 100 MG tablet Take 1 tablet (100 mg  total) by mouth daily.  [name] aspirin 81 MG tablet Take 1 tablet (81 mg total) by mouth daily.  . baclofen (LIORESAL) 10 MG tablet TAKE 1 TABLET BY MOUTH THREE TIMES A DAY  . Capsaicin-Menthol-Methyl Sal (CAPSAICIN-METHYL SAL-MENTHOL) 0.025-1-12 % CREA Apply 1 application topically 4 (four) times daily.  . carvedilol (COREG) 6.25 MG tablet Take 1 tablet (6.25 mg total) by mouth 2 (two) times daily.  . cetirizine (ZYRTEC) 10 MG tablet TAKE 1 TABLET (10 MG TOTAL) BY MOUTH DAILY.  [name] colchicine 0.6 MG tablet Take 1 tablet (0.6 mg total) by mouth daily.  . Cyanocobalamin (VITAMIN B-12 PO) Take 1 tablet by mouth daily.   . furosemide (LASIX) 20 MG tablet Take 40 mg daily alternating with 20 mg daily  . HYDROcodone-acetaminophen (NORCO/VICODIN) 5-325 MG tablet Take 1 tablet by mouth every 6 (six) hours as needed for moderate pain.  . potassium chloride SA (KLOR-CON) 20 MEQ tablet Take 40 meq daily alternating with 20 meq daily  . sacubitril-valsartan (ENTRESTO) 24-26 MG Take 1 tablet by mouth 2 (two) times daily.  [name] spironolactone (ALDACTONE) 25 MG tablet Take 1 tablet (25 mg total) by mouth daily.  [name] triamcinolone ointment (KENALOG) 0.5 % Apply 1 application topically 2 (two) times daily. To palms  . Vitamin D, Ergocalciferol, (DRISDOL) 50000 units CAPS capsule Take 1 capsule (50,000 Units total) by mouth every 7 (seven) days. After 8 weeks, take over-the-counter vitamin D 2000 units daily.     Allergies:   Crab [shellfish allergy] and Morphine and related   Social History   Tobacco Use  . Smoking status: Former Smoker    Packs/day: 0.50  . Smokeless tobacco: Never Used  . Tobacco comment: stopped 5 years ago   Substance Use Topics  . Alcohol use: Not Currently    Comment: on holidays  . Drug use: No     Family Hx: The patient's family history includes Cancer in her father; Depression in her mother; Diabetes in her mother and sister; Heart disease in her mother; Hypertension in her mother;  Stomach cancer in her brother; Stroke in her mother; Throat cancer in her son. There is no history of Breast cancer, Esophageal cancer, Colon cancer, or Colon polyps.  ROS   EKGs/Labs/Other Test Reviewed:    EKG:  EKG is  ordered today.  The ekg ordered today demonstrates sinus brady, HR 57, normal axis, low voltage, PVCs, non-specific ST-TW changes, QTc 443 ms, no changes.   Recent Labs: 04/02/2019: Hemoglobin 11.9 06/12/2019: ALT 10 07/16/2019: Magnesium 2.1 11/04/2019: NT-Pro BNP 291 11/26/2019: BUN 16; Creatinine, Ser 0.93; Potassium 4.1; Sodium 139   Recent Lipid Panel Lab Results  Component Value Date/Time   CHOL 177 10/04/2015 01:41 AM   TRIG 78 10/04/2015 01:41 AM   HDL 50 10/04/2015 01:41 AM   CHOLHDL 3.5 10/04/2015 01:41  AM   LDLCALC 111 (H) 10/04/2015 01:41 AM    Physical Exam:    VS:  BP 130/60   Pulse 66   Ht _0  (1.6 m)   Wt 187 lb (84.8 kg)   SpO2 95%   BMI 33.13 kg/m     Wt Readings from Last 3 Encounters:  02/11/20 187 lb (84.8 kg)  01/16/20 184 lb (83.5 kg)  12/10/19 184 lb (83.5 kg)     Constitutional:      Appearance: Healthy appearance. Not in distress.  Neck:     Thyroid: No thyromegaly.     Vascular: JVD normal with 7 cm of water.  Pulmonary:     Effort: Pulmonary effort is normal.     Breath sounds: No wheezing.     Comments: Coarse breath sounds at the bases Cardiovascular:     Normal rate. Regular rhythm. Normal S1. Normal S2.     Murmurs: There is no murmur.     No gallop.  Edema:    Pretibial: bilateral trace edema of the pretibial area.    Ankle: bilateral trace edema of the ankle. Abdominal:     Palpations: Abdomen is soft. There is no hepatomegaly.  Skin:    General: Skin is warm and dry.  Neurological:     Mental Status: Alert and oriented to person, place and time.     Cranial Nerves: Cranial nerves are intact.      ASSESSMENT & PLAN:    1. Acute on chronic combined systolic and diastolic ACC/AHA stage C congestive heart  failure (HCC) EF 40-45 by echocardiogram in 2019.  Non-ischemic cardiomyopathy. NYHA 3.  She notes her weights at home have increased from 180 to 187.  She has evidence of volume excess on exam.  She admits to eating too much salt.  -Increase Furosemide to 40 mg once daily x 5 days, then resume usual dose  -Increase K+ 40 mEq once daily x 5 days, then resume usual dose  -BMET today (PVCs on ECG)  -2 gm Na diet  -FU with me 2 weeks via virtual visit  2. Precordial chest pain Her symptoms are not typical for angina. I suspect this is related to volume excess.  She had a normal cath in 2017.  ECG today without acute change.  If her symptoms do not resolve with diuresis, consider obtaining a Myoview.    3. Hypertensive heart disease with chronic combined systolic and diastolic congestive heart failure (HCC) BP is controlled.  Continue current Rx.  [name]  Dispo:  Return in about 2 weeks (around 02/25/2020) for Close Follow Up, w/ [name], PA-C, via Telemedicine.   Medication Adjustments/Labs and Tests Ordered: Current medicines are reviewed at length with the patient today.  Concerns regarding medicines are outlined above.  Tests Ordered: Orders Placed This Encounter  Procedures  . Basic metabolic panel  . EKG 12-Lead   Medication Changes: No orders of the defined types were placed in this encounter.   Signed, [name], PA-C  02/11/2020 12:21 PM    [facility] [place], [place], [place]  [address] Phone: [phone]; Fax: [phone]

## 2020-02-12 LAB — BASIC METABOLIC PANEL
BUN/Creatinine Ratio: 17 (ref 12–28)
BUN: 13 mg/dL (ref 8–27)
CO2: 20 mmol/L (ref 20–29)
Calcium: 9.3 mg/dL (ref 8.7–10.3)
Chloride: 105 mmol/L (ref 96–106)
Creatinine, Ser: 0.75 mg/dL (ref 0.57–1.00)
GFR calc Af Amer: 91 mL/min/{1.73_m2} (ref >59)
GFR calc non Af Amer: 79 mL/min/{1.73_m2} (ref >59)
Glucose: 110 mg/dL — ABNORMAL HIGH (ref 65–99)
Potassium: 4 mmol/L (ref 3.5–5.2)
Sodium: 142 mmol/L (ref 134–144)

## 2020-02-25 ENCOUNTER — Other Ambulatory Visit: Payer: Self-pay | Admitting: *Deleted

## 2020-02-25 ENCOUNTER — Other Ambulatory Visit: Payer: Self-pay

## 2020-02-25 ENCOUNTER — Telehealth (INDEPENDENT_AMBULATORY_CARE_PROVIDER_SITE_OTHER): Payer: Medicare Other | Admitting: Student in an Organized Health Care Education/Training Program

## 2020-02-25 DIAGNOSIS — H25093 Other age-related incipient cataract, bilateral: Secondary | ICD-10-CM | POA: Diagnosis not present

## 2020-02-25 DIAGNOSIS — B349 Viral infection, unspecified: Secondary | ICD-10-CM | POA: Diagnosis not present

## 2020-02-25 DIAGNOSIS — H18413 Arcus senilis, bilateral: Secondary | ICD-10-CM | POA: Diagnosis not present

## 2020-02-25 MED ORDER — FLUTICASONE PROPIONATE 50 MCG/ACT NA SUSP: 2.0000 | Freq: Every day | NASAL | 1 refills | Status: DC

## 2020-02-25 MED ORDER — LORATADINE 10 MG PO TABS: 10.0000 mg | ORAL_TABLET | Freq: Every day | ORAL | 1 refills | Status: DC

## 2020-02-25 NOTE — Progress Notes (Signed)
10:37 am Called patient and LVM to complete pre-visit screening.   1116: Called and spoke with patient to complete pre-visit information. Patient states that she would like refill on pantoprazole. Reports cough, headache, and runny nose. Unable to check temperature, or HR.  Patient states all vitals were normal when she went to the eye doctor this morning.    Talbot Grumbling, RN

## 2020-02-25 NOTE — Progress Notes (Signed)
[facility] Telemedicine Visit  Patient consented to have virtual visit and was identified by name and date of birth. Method of visit: Telephone  Encounter participants: Patient: Donna Herrera - located at home in [place] Provider: Richarda Osmond - located at [facility] Others (if applicable): none  Chief Complaint: feels bad  HPI:  Felt like normal self prior to onset. Started about a week ago having balance issues due to intermittent dizziness when at rest or standing. Feels like shes going to fall over so has to sit or lay down. Lasts a few minutes and then self-resolves. Right sided headache- sharp pain. Feels congested and heartburn sensation. Having chills.  No known fevers.  Runny nose- clear.  Cough- with white phlegm. making chest hurt. Sneezing.  Eyes- watery and itchy.  No sick contacts. covid vaccinated.  Had nothing like this before. But has been treated for allergies several times and used to take cetirizine but it gives her cramps.  Tried cough syrup. Taking a tylenol for headaches but it does not help.   ROS: per HPI  Pertinent PMHx: allergies  Exam:  BP 129/70   Wt 178 lb (80.7 kg) Comment: patient reports  BMI 31.53 kg/m   Respiratory: dry cough during encounter. No conversational dyspnea  Assessment/Plan:  Viral syndrome Patient untreated for allergies. Likely having allergic symptoms with compounding viral infection. Stable and no respiratory distress.  - prescribed flonase and loratidine - recommend humidifier and other supportive measures for virus URI - low suspicion for COVID since she is vaccinated and no known sick contacts - asked patient to treat conservatively as still stable and to call back if not improving with this treatment by the end of the weak to consider a possible secondary bacterial infection such as sinusitis or bronchiolitis. Given time span of illness I think it is still reasonable to watch and  wait.  Patient expressed understanding and agreement with this plan.   Doristine Mango, [place] PGY-2

## 2020-02-25 NOTE — Assessment & Plan Note (Signed)
Patient untreated for allergies. Likely having allergic symptoms with compounding viral infection. Stable and no respiratory distress.  - prescribed flonase and loratidine - recommend humidifier and other supportive measures for virus URI - low suspicion for COVID since she is vaccinated and no known sick contacts - asked patient to treat conservatively as still stable and to call back if not improving with this treatment by the end of the weak to consider a possible secondary bacterial infection such as sinusitis or bronchiolitis. Given time span of illness I think it is still reasonable to watch and wait.  Patient expressed understanding and agreement with this plan.

## 2020-02-26 ENCOUNTER — Telehealth: Payer: Self-pay | Admitting: *Deleted

## 2020-02-26 ENCOUNTER — Other Ambulatory Visit: Payer: Self-pay

## 2020-02-26 ENCOUNTER — Encounter: Payer: Self-pay | Admitting: Physician Assistant

## 2020-02-26 ENCOUNTER — Telehealth (INDEPENDENT_AMBULATORY_CARE_PROVIDER_SITE_OTHER): Payer: Medicare Other | Admitting: Physician Assistant

## 2020-02-26 ENCOUNTER — Telehealth: Payer: Self-pay

## 2020-02-26 VITALS — BP 129/70 | Ht 65.0 in | Wt 178.0 lb

## 2020-02-26 DIAGNOSIS — I5042 Chronic combined systolic (congestive) and diastolic (congestive) heart failure: Secondary | ICD-10-CM | POA: Diagnosis not present

## 2020-02-26 MED ORDER — ENTRESTO 24-26 MG PO TABS: 1.0000 | ORAL_TABLET | Freq: Two times a day (BID) | ORAL | 3 refills | Status: DC

## 2020-02-26 NOTE — Patient Instructions (Signed)
Medication Instructions:  Continue current medications as listed.  *If you need a refill on your cardiac medications before your next appointment, please call your pharmacy*   Follow-Up: At [facility], you and your health needs are our priority.  As part of our continuing mission to provide you with exceptional heart care, we have created designated Provider Care Teams.  These Care Teams include your primary Cardiologist (physician) and Advanced Practice Providers (APPs -  Physician Assistants and Nurse Practitioners) who all work together to provide you with the care you need, when you need it.  We recommend signing up for the patient portal called "MyChart".  Sign up information is provided on this After Visit Summary.  MyChart is used to connect with patients for Virtual Visits (Telemedicine).  Patients are able to view lab/test results, encounter notes, upcoming appointments, etc.  Non-urgent messages can be sent to your provider as well.   To learn more about what you can do with MyChart, go to [email or web address].    Your next appointment:   3 month(s)  The format for your next appointment:   In Person  Provider:   Dorris Carnes, MD or [name], PA-C   Other Instructions Weigh every morning and record If your weight increases by 3 pounds or more in 1 day (or if you have increased swelling or worsening shortness of breath), call our office.

## 2020-02-26 NOTE — Progress Notes (Signed)
Virtual Visit via Telephone Note   This visit type was conducted due to national recommendations for restrictions regarding the COVID-19 Pandemic (e.g. social distancing) in an effort to limit this patient's exposure and mitigate transmission in our community.  Due to her co-morbid illnesses, this patient is at least at moderate risk for complications without adequate follow up.  This format is felt to be most appropriate for this patient at this time.  The patient did not have access to video technology/had technical difficulties with video requiring transitioning to audio format only (telephone).  All issues noted in this document were discussed and addressed.  No physical exam could be performed with this format.  Please refer to the patient's chart for her  consent to telehealth for [facility].   The patient was identified using 2 identifiers.  Date:  02/26/2020   ID:  Donna Herrera, DOB 12/06/1945, MRN [id number]  Patient Location: Home Provider Location: Office  PCP:  Danna Hefty, DO  Cardiologist:  Dorris Carnes, MD   Electrophysiologist:  None   Evaluation Performed:  Follow-Up Visit  Chief Complaint: CHF  Patient Profile: Donna Herrera is a 74 y.o. female with:  Chronic systolic CHF ? Echo 5/19: EF 15-20 ? Echo 8/19: EF 40-45  Non-Ischemic CM  Tobacco use  Hypertension   Hyperlipidemia  Slipped Capital Femoral Epiphysis syndrome ? S/p multiple hip procedures  Prior CV Studies: Echo 06/06/18 Mod LVH, EF 40-45, diff HK, Gr 1 DD, MAC, trivial MR, mild LAE, trivial TR  Echo 03/06/2018 EF 15-20, diffuse HK, grade 2 diastolic dysfunction, trivial AI, MAC, trivial MR, moderate LAE, normal RVSF, mild TR, PASP 62  LHC 05/11/16 1. Normal coronary anatomy 2. Severe LV dysfunction with EF 30-35%. 3. Normal right heart and LV filling pressures.  Echo 10/05/15 Mild LVH, EF 35-40%, diffuse HK, grade 2 diastolic dysfunction, MAC, mild MR,  moderate LAE, normal RVSF, PASP 49 mmHg  Echo 01/16/15 EF 40%, diffuse HK, mild MR, moderate LAE, mild RAE, PASP 42    History of Present Illness:   Ms. Nass was last seen 02/11/2020.  She was volume overloaded.  I adjusted her furosemide.  She is seen for follow-up on congestive heart failure.  Since last seen, she has lost 9 pounds.  Her leg swelling is improved.  Her breathing is improved.  She has not had further chest discomfort.  She has not had syncope.  She has a lot of sinus drainage, congestion with resultant headache and lightheadedness.  She has a cough associated with this.  She was seen by primary care yesterday and was placed on loratadine and fluticasone nasal spray.  Past Medical History:  Diagnosis Date   Allergy    Arthritis    Candidiasis, mouth 10/19/2017   Chronic combined systolic and diastolic CHF (congestive heart failure) ([place]) 10/21/2015   Echo 3/16: EF 40%, diffuse HK, mild MR, moderate LAE, mild RAE, PASP 42 mmHg  //  Echo 12/16: Mild LVH, EF 35-40%, diffuse HK, Gr 2 DD, MAC, mild MR, mod LAE, normal RVSF, PASP 49 mmHg // Echo 5/19: EF 15-20, diff HK, Gr 2 DD, trivial AI, MAC, trivial MR, mod LAE, normal RVSF, mild TR, PASP 62 // Limited Echo 8/19: mod LVH, EF 40-45, diff HK, Gr 1 DD, MAC, mild LAE    Chronic left shoulder pain 04/10/2013   Chronic lung disease    Chronic pain    Chronic pain syndrome 07/20/2017   Depression 08/25/2016   DJD (degenerative  joint disease) 10/12/2012   Multiple joint replacements by Dr. Collier Salina.    Dyshidrotic eczema 12/31/2014   Essential hypertension 07/20/2017   GERD (gastroesophageal reflux disease)    Gout    H/O slipped capital femoral epiphysis (SCFE)    History of total right hip replacement 04/02/2019   Followed closely with orthopedist Dr. Alvan Dame. Patient is s/p total right hip replacement in 1980's with evidence of bone loss and osteopenia making reconstruction unlikely. Plan for conservative  treatment at this time with crutches for ambulation and pain control.   Hyperlipidemia    Hypertension    Hypertensive heart disease with CHF (congestive heart failure) ([place]) 10/12/2012   Loss of taste 02/06/2018   Mass of breast, left 07/21/2014   NICM (nonischemic cardiomyopathy) ([place]) 06/09/2016   A. LHC 7/17: Normal coronary arteries, EF 30-35%, LVEDP 14 mmHg   Pain in gums 04/18/2018   Right hip pain 11/02/2012   Tendonitis, Achilles, left 05/24/2017   Tobacco abuse 09/29/2014   Vitamin D deficiency 12/31/2014   Past Surgical History:  Procedure Laterality Date   CARDIAC CATHETERIZATION  2005   normal-[place]   CARDIAC CATHETERIZATION N/A 05/11/2016   Procedure: Right/Left Heart Cath and Coronary Angiography;  Surgeon: Peter M Martinique, MD;  Location: [facility];  Service: Cardiovascular;  Laterality: N/A;   CESAREAN SECTION     COLONOSCOPY  1990's ??   HIP FUSION     left    JOINT REPLACEMENT  1991, 1997   [place]   ROTATOR CUFF REPAIR     bilateral   TOTAL HIP ARTHROPLASTY  1987   right. Dr. Collier Salina   TUBAL LIGATION       Current Meds  Medication Sig   albuterol (PROVENTIL HFA;VENTOLIN HFA) 108 (90 Base) MCG/ACT inhaler Inhale 1-2 puffs into the lungs every 6 (six) hours as needed for wheezing.   allopurinol (ZYLOPRIM) 100 MG tablet Take 1 tablet (100 mg total) by mouth daily.   aspirin 81 MG tablet Take 1 tablet (81 mg total) by mouth daily.   baclofen (LIORESAL) 10 MG tablet TAKE 1 TABLET BY MOUTH THREE TIMES A DAY   Capsaicin-Menthol-Methyl Sal (CAPSAICIN-METHYL SAL-MENTHOL) 0.025-1-12 % CREA Apply 1 application topically 4 (four) times daily.   carvedilol (COREG) 6.25 MG tablet Take 1 tablet (6.25 mg total) by mouth 2 (two) times daily.   colchicine 0.6 MG tablet Take 1 tablet (0.6 mg total) by mouth daily. (Patient taking differently: Take 0.6 mg by mouth as needed (gout flare up). )   Cyanocobalamin (VITAMIN B-12 PO) Take 1 tablet  by mouth daily.    fluticasone (FLONASE) 50 MCG/ACT nasal spray Place 2 sprays into both nostrils daily.   furosemide (LASIX) 20 MG tablet Take 40 mg daily alternating with 20 mg daily   HYDROcodone-acetaminophen (NORCO/VICODIN) 5-325 MG tablet Take 1 tablet by mouth every 6 (six) hours as needed for moderate pain.   loratadine (CLARITIN) 10 MG tablet Take 1 tablet (10 mg total) by mouth daily.   potassium chloride SA (KLOR-CON) 20 MEQ tablet Take 40 meq daily alternating with 20 meq daily   spironolactone (ALDACTONE) 25 MG tablet Take 1 tablet (25 mg total) by mouth daily.   triamcinolone ointment (KENALOG) 0.5 % Apply 1 application topically 2 (two) times daily. To palms   Vitamin D, Ergocalciferol, (DRISDOL) 50000 units CAPS capsule Take 1 capsule (50,000 Units total) by mouth every 7 (seven) days. After 8 weeks, take over-the-counter vitamin D 2000 units daily. (Patient taking differently:  Take 50,000 Units by mouth every 7 (seven) days. )   [DISCONTINUED] sacubitril-valsartan (ENTRESTO) 24-26 MG Take 1 tablet by mouth 2 (two) times daily.     Allergies:   Crab [shellfish allergy] and Morphine and related   Social History   Tobacco Use   Smoking status: Former Smoker    Packs/day: 0.50   Smokeless tobacco: Never Used   Tobacco comment: stopped 5 years ago   Substance Use Topics   Alcohol use: Not Currently    Comment: on holidays   Drug use: No     Family Hx: The patient's family history includes Cancer in her father; Depression in her mother; Diabetes in her mother and sister; Heart disease in her mother; Hypertension in her mother; Stomach cancer in her brother; Stroke in her mother; Throat cancer in her son. There is no history of Breast cancer, Esophageal cancer, Colon cancer, or Colon polyps.  ROS:   Please see the history of present illness.      Labs/Other Tests and Data Reviewed:    EKG:  No ECG reviewed.  Recent Labs: 04/02/2019: Hemoglobin  11.9 06/12/2019: ALT 10 07/16/2019: Magnesium 2.1 11/04/2019: NT-Pro BNP 291 02/11/2020: BUN 13; Creatinine, Ser 0.75; Potassium 4.0; Sodium 142   Recent Lipid Panel Lab Results  Component Value Date/Time   CHOL 177 10/04/2015 01:41 AM   TRIG 78 10/04/2015 01:41 AM   HDL 50 10/04/2015 01:41 AM   CHOLHDL 3.5 10/04/2015 01:41 AM   LDLCALC 111 (H) 10/04/2015 01:41 AM    Wt Readings from Last 3 Encounters:  02/26/20 178 lb (80.7 kg)  02/25/20 178 lb (80.7 kg)  02/11/20 187 lb (84.8 kg)     Objective:    Vital Signs:  BP 129/70    Ht _0  (1.651 m)    Wt 178 lb (80.7 kg)    BMI 29.62 kg/m    VITAL SIGNS:  reviewed GEN:  no acute distress RESPIRATORY:  No labored breathing noted NEURO:  Alert and oriented PSYCH:  normal affect  ASSESSMENT & PLAN:    1. Chronic combined systolic and diastolic CHF (congestive heart failure) (HCC) EF 40-45.  Nonischemic cardiomyopathy.  NYHA II-IIb.  Volume status overall improved.  Weight is down 9 pounds.  She does continue to have a cough which is related to sinus congestion and drainage.  Continue medications as prescribed by primary care.  Continue current dose of furosemide, carvedilol, Entresto, spironolactone.  Follow-up with Dr. Harrington Challenger or me in 3 months.    Time:   Today, I have spent 9 minutes with the patient with telehealth technology discussing the above problems.     Medication Adjustments/Labs and Tests Ordered: Current medicines are reviewed at length with the patient today.  Concerns regarding medicines are outlined above.   Tests Ordered: No orders of the defined types were placed in this encounter.   Medication Changes: No orders of the defined types were placed in this encounter.   Follow Up:  In Person in 3 month(s)  Signed, [name], PA-C  02/26/2020 12:21 PM    [facility]

## 2020-02-26 NOTE — Telephone Encounter (Signed)
Attempted patient several time to make appointment follow up  as recommended by Kathlen Mody.Patient phone have a busy signal or operator tells me I unable to send a message

## 2020-02-26 NOTE — Telephone Encounter (Signed)
New Entresto 24-26 mg #180 with 3 refills sent to [facility] (pharmacy for [employer] [facility]) to fill.

## 2020-02-26 NOTE — Telephone Encounter (Signed)
**Note De-identified  Obfuscation** -----  **Note De-Identified  Obfuscation** Message from Liliane Shi, [place] sent at 02/26/2020 12:09 PM EDT ----- Jeani Hawking I had changed her dose from 24/26 to 49/51 a few mos ago.  Then I had to reduce it back to 24/26 again b/c her BP was too low.  She was getting from [employer].  But, since I reduced the dose back, she has not been.  Can you help with that? Thanks! Scott

## 2020-02-27 MED ORDER — HYDROCODONE-ACETAMINOPHEN 5-325 MG PO TABS: 1.0000 | ORAL_TABLET | Freq: Four times a day (QID) | ORAL | 0 refills | Status: DC | PRN

## 2020-03-18 ENCOUNTER — Other Ambulatory Visit: Payer: Self-pay | Admitting: Student in an Organized Health Care Education/Training Program

## 2020-03-20 ENCOUNTER — Telehealth: Payer: Self-pay | Admitting: Internal Medicine

## 2020-03-20 DIAGNOSIS — I5042 Chronic combined systolic (congestive) and diastolic (congestive) heart failure: Secondary | ICD-10-CM

## 2020-03-20 MED ORDER — SPIRONOLACTONE 25 MG PO TABS
12.5000 mg | ORAL_TABLET | Freq: Every day | ORAL | 3 refills | Status: DC
Start: 2020-03-20 — End: 2021-03-01

## 2020-03-20 NOTE — Telephone Encounter (Signed)
    Pt c/o BP issue: STAT if pt c/o blurred vision, one-sided weakness or slurred speech  1. What are your last 5 BP readings?  88/55 after 30 mins 105/63 Wednesday 91/55 Monday 90/61  2. Are you having any other symptoms (ex. Dizziness, headache, blurred vision, passed out)? Dizziness and Lightheadedness   3. What is your BP issue?   Lauren [employer] case manager and the pt calling, they said pt been having low BP the whole week, especially in the morning, she gets lightheadedness when she stands up. She also said she gets cramps often on her legs and hands. Lauren said pt consistently taking all of her heart meds everyday. She said to call pt for recommendation  Please advise

## 2020-03-20 NOTE — Telephone Encounter (Signed)
I will send call to primary card nurse Michalene.

## 2020-03-20 NOTE — Telephone Encounter (Signed)
SOunds good

## 2020-03-20 NOTE — Telephone Encounter (Signed)
Called patient back who has been getting low BPs systolic [id number] and feeling lightheaded. She is taking a couple meds differently than ordered: Taking lasix 20 mg daily and potassium 20 meq every other day  Reviewed with [name], PA-C who recommends:  -Hold Entresto tonight -hold Lasix over the weekend -will hold potassium over weekend also -decrease spironolactone to 12.5 mg daily -weight daily--call if gains >3 lbs or more overnight -bmet early next week  All instructions written down and pt verbalizes understanding.

## 2020-03-21 ENCOUNTER — Telehealth: Payer: Self-pay | Admitting: Cardiology

## 2020-03-21 NOTE — Telephone Encounter (Signed)
Opened in error

## 2020-03-23 ENCOUNTER — Telehealth: Payer: Self-pay | Admitting: Physician Assistant

## 2020-03-23 DIAGNOSIS — D369 Benign neoplasm, unspecified site: Secondary | ICD-10-CM

## 2020-03-23 HISTORY — DX: Benign neoplasm, unspecified site: D36.9

## 2020-03-23 NOTE — Telephone Encounter (Signed)
Hold Entresto for now. Arrange follow up this week with Dr. Harrington Challenger, me or another APP. [name], PA-C    03/23/2020 5:12 PM

## 2020-03-23 NOTE — Progress Notes (Signed)
Subjective:   Patient ID: Donna Herrera    DOB: 1945/11/22, 74 y.o. female   MRN: [id number]  Donna Herrera is a 74 y.o. female with a history of nonischemic cardiomyopathy, HTN heart disease with CHF, HTN, combined CHF, chronic lung disease, GERD, DJD, Vitamin D deficiency, HLD, gout, chronic pain, depression here for check up.  Concern for Diabetes: Last three A1C's below. Had A1C checked by home NP and was noted to have A1C of 6.3. Not currently on any anti-glycemic medications. Denies any hypoglycemia. Denies any polyuria, polydipsia, polyphagia.  Lab Results  Component Value Date   HGBA1C 6.6 03/24/2020   HGBA1C 6.3 10/12/2012   HGBA1C 6.8 (H) 04/22/2010    HTN:  BP: 102/62(provider informed) today. Has been seen by cardiologist with adjustments in medications for soft blood pressures. Currently on Entresto. Endorses compliance. Denies any chest pain, SOB, vision changes, or headaches. BMP in 01/2020 with normal and stable kidney function and electrolytes.  Vitamin D Deficiency: Last Vitamin D level was 15 in 01/2018. Currently on Vitamin D supplementation but currently not taking it.  Tobacco Abuse History of smoking 1PPD x 52 years.Quit in 2017  Combined CHF: Patient has HFrEF with EF of 40-45% in Aug 2019, improved EF from 15-20% from prior in May 2019. LHC in 2017 with normal coronary anatomy. Weight 178lbs (02/26/20), wt today 181lbs. Patient notes her weight at home is 177lbs yesterday, 176lbs today. Currently on Lasix 20 QD, Coreg 6.25mg  QD, Entresto 24-26mg  BID (currently held), Spironolactone 12.5mg  QD, and ASA. Followed closely by Dr. Harrington Challenger. Has appointment with Dr. Kathlen Mody tomorrow. Denies chest pain, SOB, LE edema.   Review of Systems:  Per HPI.   Objective:   BP 102/62 Comment: provider informed  Pulse 60   Wt 181 lb (82.1 kg)   SpO2 98%   BMI 30.12 kg/m  Vitals and nursing note reviewed.  General: pleasant older female, sitting on exam bed  with granddaughters at side, well nourished, well developed, in no acute distress with non-toxic appearance CV: regular rate and rhythm without murmurs, rubs, or gallops, trace LE edema bilaterally Lungs: clear to auscultation bilaterally with normal work of breathing, no wheezes, rales, or rhonchi appreciated  Skin: warm, dry Extremities: warm and well perfused Neuro: Alert and oriented, speech normal  Assessment & Plan:   History of tobacco abuse History of smoking 1PPD x 52 years.Quit in 2017 Pack years: 66  Qualifies for lung cancer screen. Does not appear to have had screening prior. Will discuss and arrange at follow up visit.   Vitamin D deficiency Vitamin D level 15. Patient currently not on supplementation. Patient instructed to start 1000IU Vitamin D + 1200mg  Calcium daily   Screening for diabetes mellitus Patient had A1C of 6.3 at home earlier this month. Repeat today is 6.6. History of prior elevated A1C in 2011. All prior BMP's with glucose level >126.  Given age, A1C goal would be <8. Discussed management options including Metformin vs lifestyle modifications. Patient opted to work on diet at this time. She plans to cut down on gummy bears and sodas. Plan to recheck A1C in 6 months.   Screen for Osteoporosis  Chronic Estrogen Deficiency: Obtain bone density Start Vitamin D and calcium  CHF  Soft Blood Pressure: Patient has endorsed dizziness in setting up soft blood pressures. Hgb stable at 12.3. Last EKG in April with NSR, HR 57. She is being followed very closely with cardiology with appointment on 5/26. Her heart failure medications have  been adjusted/held to allow for further work up and improvement in symptoms. She does not appear volume overloaded. Her weight is stable based on home scale. Will continue to follow cardiology recs. She was instructed to call me or cardiologist ASAP if weight gain >3lbs, worsening edema, development of SOB. She voiced understanding and  agreement with plan.   Orders Placed This Encounter  Procedures  . DG Bone Density    Standing Status:   Future    Standing Expiration Date:   03/23/2021    Order Specific Question:   Reason for Exam (SYMPTOM  OR DIAGNOSIS REQUIRED)    Answer:   screen for osteoporsis    Order Specific Question:   Preferred imaging location?    Answer:   [facility]  . Vitamin D (25 hydroxy)  . POCT glycosylated hemoglobin (Hb A1C)    Addendum: Called to check in on patient. She notes her dizziness has improved with medication adjustments by cardiologist.  Coreg decreased to 3.125mg  BID, hold Entresto. Lasix + Potassium MWF. Follow up with Dr. Harrington Challenger in 4-6 weeks. Consider restarting Entresto at that time if BP can tolerate. If not, consider low dose Lisinopril.  Mina Marble, DO PGY-2, [facility] 03/26/2020 9:22 AM

## 2020-03-23 NOTE — H&P (Addendum)
HISTORY AND PHYSICAL  Donna Herrera is a 74 y.o. female patient with CC: painful teeth  No diagnosis found.  Past Medical History:  Diagnosis Date  . Allergy   . Arthritis   . Candidiasis, mouth 10/19/2017  . Chronic combined systolic and diastolic CHF (congestive heart failure) ([place]) 10/21/2015   Echo 3/16: EF 40%, diffuse HK, mild MR, moderate LAE, mild RAE, PASP 42 mmHg  //  Echo 12/16: Mild LVH, EF 35-40%, diffuse HK, Gr 2 DD, MAC, mild MR, mod LAE, normal RVSF, PASP 49 mmHg // Echo 5/19: EF 15-20, diff HK, Gr 2 DD, trivial AI, MAC, trivial MR, mod LAE, normal RVSF, mild TR, PASP 62 // Limited Echo 8/19: mod LVH, EF 40-45, diff HK, Gr 1 DD, MAC, mild LAE   . Chronic left shoulder pain 04/10/2013  . Chronic lung disease   . Chronic pain   . Chronic pain syndrome 07/20/2017  . Depression 08/25/2016  . DJD (degenerative joint disease) 10/12/2012   Multiple joint replacements by Dr. Collier Salina.   . Dyshidrotic eczema 12/31/2014  . Essential hypertension 07/20/2017  . GERD (gastroesophageal reflux disease)   . Gout   . H/O slipped capital femoral epiphysis (SCFE)   . History of total right hip replacement 04/02/2019   Followed closely with orthopedist Dr. Alvan Dame. Patient is s/p total right hip replacement in 1980's with evidence of bone loss and osteopenia making reconstruction unlikely. Plan for conservative treatment at this time with crutches for ambulation and pain control.  . Hyperlipidemia   . Hypertension   . Hypertensive heart disease with CHF (congestive heart failure) ([place]) 10/12/2012  . Loss of taste 02/06/2018  . Mass of breast, left 07/21/2014  . NICM (nonischemic cardiomyopathy) ([place]) 06/09/2016   A. LHC 7/17: Normal coronary arteries, EF 30-35%, LVEDP 14 mmHg  . Pain in gums 04/18/2018  . Right hip pain 11/02/2012  . Tendonitis, Achilles, left 05/24/2017  . Tobacco abuse 09/29/2014  . Vitamin D deficiency 12/31/2014    No current facility-administered medications for  this encounter.   Current Outpatient Medications  Medication Sig Dispense Refill  . albuterol (PROVENTIL HFA;VENTOLIN HFA) 108 (90 Base) MCG/ACT inhaler Inhale 1-2 puffs into the lungs every 6 (six) hours as needed for wheezing. 1 Inhaler 0  . allopurinol (ZYLOPRIM) 100 MG tablet Take 1 tablet (100 mg total) by mouth daily. 30 tablet 6  . aspirin 81 MG tablet Take 1 tablet (81 mg total) by mouth daily. 90 tablet 3  . baclofen (LIORESAL) 10 MG tablet TAKE 1 TABLET BY MOUTH THREE TIMES A DAY 270 tablet 1  . Capsaicin-Menthol-Methyl Sal (CAPSAICIN-METHYL SAL-MENTHOL) 0.025-1-12 % CREA Apply 1 application topically 4 (four) times daily. 1 Tube 2  . carvedilol (COREG) 6.25 MG tablet Take 1 tablet (6.25 mg total) by mouth 2 (two) times daily. 60 tablet 5  . colchicine 0.6 MG tablet Take 1 tablet (0.6 mg total) by mouth daily. (Patient taking differently: Take 0.6 mg by mouth as needed (gout flare up). ) 30 tablet 2  . Cyanocobalamin (VITAMIN B-12 PO) Take 1 tablet by mouth daily.     . fluticasone (FLONASE) 50 MCG/ACT nasal spray SPRAY 2 SPRAYS INTO EACH NOSTRIL EVERY DAY 16 mL 1  . furosemide (LASIX) 20 MG tablet Take 40 mg daily alternating with 20 mg daily (Patient taking differently: 20 mg daily. Take 40 mg daily alternating with 20 mg daily) 30 tablet   . HYDROcodone-acetaminophen (NORCO/VICODIN) 5-325 MG tablet Take 1 tablet by mouth every  6 (six) hours as needed for moderate pain. 30 tablet 0  . loratadine (CLARITIN) 10 MG tablet Take 1 tablet (10 mg total) by mouth daily. 45 tablet 1  . potassium chloride SA (KLOR-CON) 20 MEQ tablet Take 40 meq daily alternating with 20 meq daily (Patient taking differently: 20 mEq every other day. Take 40 meq daily alternating with 20 meq daily) 90 tablet 1  . sacubitril-valsartan (ENTRESTO) 24-26 MG Take 1 tablet by mouth 2 (two) times daily. 180 tablet 3  . spironolactone (ALDACTONE) 25 MG tablet Take 0.5 tablets (12.5 mg total) by mouth daily. 45 tablet 3  .  triamcinolone ointment (KENALOG) 0.5 % Apply 1 application topically 2 (two) times daily. To palms 30 g 3  . Vitamin D, Ergocalciferol, (DRISDOL) 50000 units CAPS capsule Take 1 capsule (50,000 Units total) by mouth every 7 (seven) days. After 8 weeks, take over-the-counter vitamin D 2000 units daily. (Patient taking differently: Take 50,000 Units by mouth every 7 (seven) days. ) 8 capsule 0   Allergies  Allergen Reactions  . Crab [Shellfish Allergy]     Itching to lips  . Morphine And Related Itching and Swelling   Active Problems:   * No active hospital problems. *  Vitals: There were no vitals taken for this visit. Lab results:No results found for this or any previous visit (from the past 42 hour(s)). Radiology Results: No results found. Exam: BMI  30.  Gross decay all remaining teeth #2, 18, 20, 22, 23, 25, 26, 27, 29. No purulence, edema, fluctuance, trismus. Oral cancer screening negative. Pharynx clear. No lymphadenopathy. Cor-RRR, Lungs-clear.  Panorex:Gross decay all remaining teeth #2, 18, 20, 22, 23, 25, 26, 27, 29.  Assessment:  ASA  3.non-restorable all remaining teeth #2, 18, 20, 22, 23, 25, 26, 27, 29.             Plan: [facility] day surgery. Med clearance.        [name] 03/23/2020

## 2020-03-23 NOTE — Telephone Encounter (Signed)
   Pt c/o BP issue: STAT if pt c/o blurred vision, one-sided weakness or slurred speech  1. What are your last 5 BP readings? 96/81  2. Are you having any other symptoms (ex. Dizziness, headache, blurred vision, passed out)? No energy  3. What is your BP issue? Surveyor, minerals from [employer] called, she said pt has 96/81 BP. She is aware that [name] hold her meds for 2 days however, she feels like if pt takes her meds again it will lower her BP more. She said pt been sleeping all day, no energy. She said to call pt for any recommendations  Please advise

## 2020-03-23 NOTE — Telephone Encounter (Signed)
Patient notified. Appointment made for in office visit with [name], PA on May 26,2021 at 11:15.  Pt was scheduled to have BMP on 5/25.  I told her this could be moved to 5/26 and done after she saw [name], PA that day.

## 2020-03-23 NOTE — Telephone Encounter (Signed)
I spoke to the patient who said that she followed Friday's medication instructions.  She woke up this morning with a BP of 98 systolic and was told to call if less than [id number] systolic.  This was between 10-11 this morning.    She has not taken any of her morning medications.  She waited until she gets further advisement from [place].

## 2020-03-24 ENCOUNTER — Other Ambulatory Visit: Payer: Medicare Other

## 2020-03-24 ENCOUNTER — Ambulatory Visit (INDEPENDENT_AMBULATORY_CARE_PROVIDER_SITE_OTHER): Payer: Medicare Other | Admitting: Family Medicine

## 2020-03-24 ENCOUNTER — Encounter: Payer: Self-pay | Admitting: Family Medicine

## 2020-03-24 ENCOUNTER — Other Ambulatory Visit: Payer: Self-pay

## 2020-03-24 VITALS — BP 102/62 | HR 60 | Wt 181.0 lb

## 2020-03-24 DIAGNOSIS — Z131 Encounter for screening for diabetes mellitus: Secondary | ICD-10-CM | POA: Diagnosis not present

## 2020-03-24 DIAGNOSIS — E559 Vitamin D deficiency, unspecified: Secondary | ICD-10-CM

## 2020-03-24 DIAGNOSIS — Z87891 Personal history of nicotine dependence: Secondary | ICD-10-CM | POA: Diagnosis not present

## 2020-03-24 DIAGNOSIS — E2839 Other primary ovarian failure: Secondary | ICD-10-CM

## 2020-03-24 LAB — POCT GLYCOSYLATED HEMOGLOBIN (HGB A1C): HbA1c, POC (controlled diabetic range): 6.6 % (ref 0.0–7.0)

## 2020-03-24 NOTE — Progress Notes (Signed)
Cardiology Office Note:    Date:  03/25/2020   ID:  Donna Herrera, DOB 1946/08/03, MRN [id number]  PCP:  Danna Hefty, DO  Cardiologist:  Dorris Carnes, MD   Electrophysiologist:  None   Referring MD: Danna Hefty, DO   Chief Complaint:  Low BP    Patient Profile:    Donna Herrera is a 74 y.o. female with:   Chronic systolic CHF ? Echo 5/19: EF 15-20 ? Echo 8/19: EF 40-45  Non-Ischemic CM  Tobacco use  Hypertension   Hyperlipidemia  Slipped Capital Femoral Epiphysis syndrome ? S/p multiple hip procedures  Prior CV Studies: Echo 06/06/18 Mod LVH, EF 40-45, diff HK, Gr 1 DD, MAC, trivial MR, mild LAE, trivial TR  Echo 03/06/2018 EF 15-20, diffuse HK, grade 2 diastolic dysfunction, trivial AI, MAC, trivial MR, moderate LAE, normal RVSF, mild TR, PASP 62  LHC 05/11/16 1. Normal coronary anatomy 2. Severe LV dysfunction with EF 30-35%. 3. Normal right heart and LV filling pressures.  Echo 10/05/15 Mild LVH, EF 35-40%, diffuse HK, grade 2 diastolic dysfunction, MAC, mild MR, moderate LAE, normal RVSF, PASP 49 mmHg  Echo 01/16/15 EF 40%, diffuse HK, mild MR, moderate LAE, mild RAE, PASP 42   History of Present Illness:    Donna Herrera was last seen via Telemedicine 02/26/2020.  She called in recently with symptomatic hypotension.  I cut back on some of her medications and eventually held her Entresto. She returns for evaluation.  She is here with her granddaughter.  She notes issues with lower blood pressure recently.  She has not really drastically changed her diet.  However, her granddaughter does note that she does not have a good appetite.  She has not had chest pain.  Her breathing has been stable.  She has not had orthopnea, lower extremity swelling or syncope.  She does feel lightheaded/dizzy when her blood pressure is low.  Past Medical History:  Diagnosis Date  . Allergy   . Arthritis   . Candidiasis, mouth 10/19/2017  .  Chronic combined systolic and diastolic CHF (congestive heart failure) ([place]) 10/21/2015   Echo 3/16: EF 40%, diffuse HK, mild MR, moderate LAE, mild RAE, PASP 42 mmHg  //  Echo 12/16: Mild LVH, EF 35-40%, diffuse HK, Gr 2 DD, MAC, mild MR, mod LAE, normal RVSF, PASP 49 mmHg // Echo 5/19: EF 15-20, diff HK, Gr 2 DD, trivial AI, MAC, trivial MR, mod LAE, normal RVSF, mild TR, PASP 62 // Limited Echo 8/19: mod LVH, EF 40-45, diff HK, Gr 1 DD, MAC, mild LAE   . Chronic left shoulder pain 04/10/2013  . Chronic lung disease   . Chronic pain   . Chronic pain syndrome 07/20/2017  . Depression 08/25/2016  . DJD (degenerative joint disease) 10/12/2012   Multiple joint replacements by Dr. Collier Salina.   . Dyshidrotic eczema 12/31/2014  . Essential hypertension 07/20/2017  . GERD (gastroesophageal reflux disease)   . Gout   . H/O slipped capital femoral epiphysis (SCFE)   . History of total right hip replacement 04/02/2019   Followed closely with orthopedist Dr. Alvan Dame. Patient is s/p total right hip replacement in 1980's with evidence of bone loss and osteopenia making reconstruction unlikely. Plan for conservative treatment at this time with crutches for ambulation and pain control.  . Hyperlipidemia   . Hypertension   . Hypertensive heart disease with CHF (congestive heart failure) ([place]) 10/12/2012  . Loss of taste 02/06/2018  . Mass of breast, left 07/21/2014  .  NICM (nonischemic cardiomyopathy) ([place]) 06/09/2016   A. LHC 7/17: Normal coronary arteries, EF 30-35%, LVEDP 14 mmHg  . Pain in gums 04/18/2018  . Right hip pain 11/02/2012  . Tendonitis, Achilles, left 05/24/2017  . Tobacco abuse 09/29/2014  . Vitamin D deficiency 12/31/2014    Current Medications: Current Meds  Medication Sig  . albuterol (PROVENTIL HFA;VENTOLIN HFA) 108 (90 Base) MCG/ACT inhaler Inhale 1-2 puffs into the lungs every 6 (six) hours as needed for wheezing.  [name] allopurinol (ZYLOPRIM) 100 MG tablet Take 1 tablet (100 mg total) by mouth  daily.  [name] aspirin 81 MG tablet Take 1 tablet (81 mg total) by mouth daily.  . baclofen (LIORESAL) 10 MG tablet TAKE 1 TABLET BY MOUTH THREE TIMES A DAY  . Capsaicin-Menthol-Methyl Sal (CAPSAICIN-METHYL SAL-MENTHOL) 0.025-1-12 % CREA Apply 1 application topically 4 (four) times daily.  . carvedilol (COREG) 3.125 MG tablet Take 1 tablet (3.125 mg total) by mouth 2 (two) times daily.  . colchicine 0.6 MG tablet Take 1 tablet (0.6 mg total) by mouth daily.  . Cyanocobalamin (VITAMIN B-12 PO) Take 1 tablet by mouth daily.   . fluticasone (FLONASE) 50 MCG/ACT nasal spray SPRAY 2 SPRAYS INTO EACH NOSTRIL EVERY DAY  . HYDROcodone-acetaminophen (NORCO/VICODIN) 5-325 MG tablet Take 1 tablet by mouth every 6 (six) hours as needed for moderate pain.  [name] loratadine (CLARITIN) 10 MG tablet Take 1 tablet (10 mg total) by mouth daily.  [name] spironolactone (ALDACTONE) 25 MG tablet Take 0.5 tablets (12.5 mg total) by mouth daily.  [name] triamcinolone ointment (KENALOG) 0.5 % Apply 1 application topically 2 (two) times daily. To palms  . Vitamin D, Ergocalciferol, (DRISDOL) 50000 units CAPS capsule Take 1 capsule (50,000 Units total) by mouth every 7 (seven) days. After 8 weeks, take over-the-counter vitamin D 2000 units daily.  . [DISCONTINUED] carvedilol (COREG) 6.25 MG tablet Take 1 tablet (6.25 mg total) by mouth 2 (two) times daily.  . [DISCONTINUED] furosemide (LASIX) 20 MG tablet Take 20 mg by mouth daily.     Allergies:   Crab [shellfish allergy] and Morphine and related   Social History   Tobacco Use  . Smoking status: Former Smoker    Packs/day: 0.50  . Smokeless tobacco: Never Used  . Tobacco comment: stopped 5 years ago   Substance Use Topics  . Alcohol use: Not Currently    Comment: on holidays  . Drug use: No     Family Hx: The patient's family history includes Cancer in her father; Depression in her mother; Diabetes in her mother and sister; Heart disease in her mother; Hypertension in her  mother; Stomach cancer in her brother; Stroke in her mother; Throat cancer in her son. There is no history of Breast cancer, Esophageal cancer, Colon cancer, or Colon polyps.  Review of Systems  Constitution: Negative for fever.  Gastrointestinal: Negative for diarrhea, hematochezia, melena and vomiting.  Genitourinary: Negative for hematuria.     EKGs/Labs/Other Test Reviewed:    EKG:  EKG is not ordered today.  The ekg ordered today demonstrates n/a  Recent Labs: 04/02/2019: Hemoglobin 11.9 06/12/2019: ALT 10 07/16/2019: Magnesium 2.1 11/04/2019: NT-Pro BNP 291 02/11/2020: BUN 13; Creatinine, Ser 0.75; Potassium 4.0; Sodium 142   Recent Lipid Panel Lab Results  Component Value Date/Time   CHOL 177 10/04/2015 01:41 AM   TRIG 78 10/04/2015 01:41 AM   HDL 50 10/04/2015 01:41 AM   CHOLHDL 3.5 10/04/2015 01:41 AM   LDLCALC 111 (H) 10/04/2015 01:41 AM  Physical Exam:    VS:  BP 108/72   Pulse (!) 58   Ht _0  (1.676 m)   Wt 178 lb (80.7 kg)   SpO2 98%   BMI 28.73 kg/m     Wt Readings from Last 3 Encounters:  03/25/20 178 lb (80.7 kg)  03/24/20 181 lb (82.1 kg)  02/26/20 178 lb (80.7 kg)     Constitutional:      Appearance: Healthy appearance. Not in distress.  Neck:     Thyroid: No thyromegaly.     Vascular: JVD normal.  Pulmonary:     Effort: Pulmonary effort is normal.     Breath sounds: No wheezing. No rales.  Cardiovascular:     Normal rate. Regular rhythm. Normal S1. Normal S2.     Murmurs: There is no murmur.  Edema:    Peripheral edema absent.  Abdominal:     Palpations: Abdomen is soft. There is no hepatomegaly.  Skin:    General: Skin is warm and dry.  Neurological:     Mental Status: Alert and oriented to person, place and time.     Cranial Nerves: Cranial nerves are intact.      ASSESSMENT & PLAN:    1. Chronic combined systolic and diastolic CHF (congestive heart failure) ([place]) 2. Hypertensive heart disease with chronic combined systolic and  diastolic congestive heart failure ([place]) 3. Hypotension, unspecified hypotension type Nonischemic cardiomyopathy.  NYHA II-IIb.  Volume status overall stable.  Her blood pressures are running low recently.  She has been symptomatic with this.  We have previously tried to increase her Entresto but had reduce the dose due to low blood pressure.  Her granddaughter does note that she often has a poor appetite.  Question if this may be related to her lower blood pressures.  She has not really drastically changed her diet.  I will obtain a follow-up BMP, CBC today.  Decrease carvedilol to 3.125 mg twice daily.  Continue to hold Entresto.  Change furosemide and potassium to every Monday, Wednesday, Friday.  We discussed the importance of daily weights and when to take extra potassium and Lasix.  Follow-up with Dr. Harrington Challenger or me in 4 to 6 weeks.  If her blood pressure can tolerate it, we can try to resume Entresto at that time.  If not we may need to consider placing her back on low-dose lisinopril.   Dispo:  Return in about 4 weeks (around 04/22/2020) for Routine Follow Up, w/ Dr. Harrington Challenger, or [name], PA-C, in person.   Medication Adjustments/Labs and Tests Ordered: Current medicines are reviewed at length with the patient today.  Concerns regarding medicines are outlined above.  Tests Ordered: Orders Placed This Encounter  Procedures  . CBC   Medication Changes: Meds ordered this encounter  Medications  . potassium chloride SA (KLOR-CON) 20 MEQ tablet    Sig: Take 1 tablet by mouth on Monday/Wednesday/Friday. Take an additional dose with Furosemide if weight goes up 3 pounds or more in 1 day.    Dispense:  30 tablet    Refill:  3  . furosemide (LASIX) 20 MG tablet    Sig: Take 1 tablet by mouth on Monday/Wednesday/Friday. Take an additional dose with Potassium if weight goes up 3 pounds or more in 1 day.    Dispense:  30 tablet    Refill:  3  . carvedilol (COREG) 3.125 MG tablet    Sig: Take 1  tablet (3.125 mg total) by mouth 2 (two)  times daily.    Dispense:  60 tablet    Refill:  3    Signed, [name], PA-C  03/25/2020 5:20 PM    [facility] [place], [place], [place]  [address] Phone: [phone]; Fax: [phone]

## 2020-03-25 ENCOUNTER — Encounter: Payer: Self-pay | Admitting: Physician Assistant

## 2020-03-25 ENCOUNTER — Other Ambulatory Visit: Payer: Medicare Other | Admitting: *Deleted

## 2020-03-25 ENCOUNTER — Ambulatory Visit: Payer: Medicare Other | Admitting: Physician Assistant

## 2020-03-25 VITALS — BP 108/72 | HR 58 | Ht 66.0 in | Wt 178.0 lb

## 2020-03-25 DIAGNOSIS — I110 Hypertensive heart disease with heart failure: Secondary | ICD-10-CM

## 2020-03-25 DIAGNOSIS — I428 Other cardiomyopathies: Secondary | ICD-10-CM

## 2020-03-25 DIAGNOSIS — I959 Hypotension, unspecified: Secondary | ICD-10-CM

## 2020-03-25 DIAGNOSIS — I5042 Chronic combined systolic (congestive) and diastolic (congestive) heart failure: Secondary | ICD-10-CM

## 2020-03-25 LAB — VITAMIN D 25 HYDROXY (VIT D DEFICIENCY, FRACTURES): Vit D, 25-Hydroxy: 15.8 ng/mL — ABNORMAL LOW (ref 30.0–100.0)

## 2020-03-25 MED ORDER — FUROSEMIDE 20 MG PO TABS
ORAL_TABLET | ORAL | 3 refills | Status: DC
Start: 2020-03-25 — End: 2020-04-28

## 2020-03-25 MED ORDER — POTASSIUM CHLORIDE CRYS ER 20 MEQ PO TBCR: EXTENDED_RELEASE_TABLET | ORAL | 3 refills | Status: DC

## 2020-03-25 MED ORDER — CARVEDILOL 3.125 MG PO TABS: 3.1250 mg | ORAL_TABLET | Freq: Two times a day (BID) | ORAL | 3 refills | Status: DC

## 2020-03-25 NOTE — Patient Instructions (Addendum)
Medication Instructions:   Your physician has recommended you make the following change in your medication:   1) Stop Entresto 2) Change Lasix to 20 mg every Monday/Wednesday/Friday 3) Change Potassium 20 mEq every Monday/Wednesday/Friday 4) Decrease Coreg to 3.125 mg, 1 tablet by mouth twice a day  *If you need a refill on your cardiac medications before your next appointment, please call your pharmacy*  Lab Work:  You will have labs drawn today: BMET/CBC  Testing/Procedures:  None ordered today  Follow-Up:  On 04/24/20 at 11:15AM with [name], PA-C   Other Instructions  Weigh yourself daily, take an extra dose of Lasix and Potassium if your weight goes up 3 pounds or more in 1 day. DO NOT THROW AWAY YOUR ENTRESTO.

## 2020-03-25 NOTE — Addendum Note (Signed)
Addended byKathlen Mody, Nicki Reaper T on: 03/25/2020 05:21 PM   Modules accepted: Level of Service

## 2020-03-25 NOTE — Telephone Encounter (Signed)
See OV note from today. [name], PA-C    03/25/2020 5:39 PM

## 2020-03-26 DIAGNOSIS — E119 Type 2 diabetes mellitus without complications: Secondary | ICD-10-CM | POA: Insufficient documentation

## 2020-03-26 DIAGNOSIS — Z87891 Personal history of nicotine dependence: Secondary | ICD-10-CM | POA: Insufficient documentation

## 2020-03-26 DIAGNOSIS — R7303 Prediabetes: Secondary | ICD-10-CM | POA: Insufficient documentation

## 2020-03-26 LAB — CBC
Hematocrit: 36.8 % (ref 34.0–46.6)
Hemoglobin: 12.3 g/dL (ref 11.1–15.9)
MCH: 29.3 pg (ref 26.6–33.0)
MCHC: 33.4 g/dL (ref 31.5–35.7)
MCV: 88 fL (ref 79–97)
Platelets: 205 10*3/uL (ref 150–450)
RBC: 4.2 x10E6/uL (ref 3.77–5.28)
RDW: 13.2 % (ref 11.7–15.4)
WBC: 4.6 10*3/uL (ref 3.4–10.8)

## 2020-03-26 LAB — BASIC METABOLIC PANEL
BUN/Creatinine Ratio: 20 (ref 12–28)
BUN: 21 mg/dL (ref 8–27)
CO2: 20 mmol/L (ref 20–29)
Calcium: 9.7 mg/dL (ref 8.7–10.3)
Chloride: 98 mmol/L (ref 96–106)
Creatinine, Ser: 1.04 mg/dL — ABNORMAL HIGH (ref 0.57–1.00)
GFR calc Af Amer: 61 mL/min/{1.73_m2} (ref >59)
GFR calc non Af Amer: 53 mL/min/{1.73_m2} — ABNORMAL LOW (ref >59)
Glucose: 89 mg/dL (ref 65–99)
Potassium: 5.2 mmol/L (ref 3.5–5.2)
Sodium: 137 mmol/L (ref 134–144)

## 2020-03-26 NOTE — Assessment & Plan Note (Signed)
Patient had A1C of 6.3 at home earlier this month. Repeat today is 6.6. History of prior elevated A1C in 2011. All prior BMP's with glucose level >126.  Given age, A1C goal would be <8. Discussed management options including Metformin vs lifestyle modifications. Patient opted to work on diet at this time. She plans to cut down on gummy bears and sodas. Plan to recheck A1C in 6 months.

## 2020-03-26 NOTE — Assessment & Plan Note (Signed)
History of smoking 1PPD x 52 years.Quit in 2017 Pack years: 8  Qualifies for lung cancer screen. Does not appear to have had screening prior. Will discuss and arrange at follow up visit.

## 2020-03-26 NOTE — Assessment & Plan Note (Deleted)
Patient had A1C of 6.3 at home earlier this month. Repeat today is 6.6. Given age, I believe conservative treatment is indicated. Discussed management options including Metformin vs lifestyle modifications. Patient opted to work on diet at this time. She plans to cut down on gummy bears and sodas. Plan to recheck A1C in 3-6 months.

## 2020-03-26 NOTE — Assessment & Plan Note (Signed)
Vitamin D level 15. Patient currently not on supplementation. Patient instructed to start 1000IU Vitamin D + 1200mg  Calcium daily

## 2020-03-31 ENCOUNTER — Other Ambulatory Visit (HOSPITAL_COMMUNITY)
Admission: RE | Admit: 2020-03-31 | Discharge: 2020-03-31 | Disposition: A | Discharge status: Home / Self Care | Payer: Medicare Other | Source: Ambulatory Visit | Attending: Oral Surgery | Admitting: Oral Surgery

## 2020-03-31 DIAGNOSIS — Z20822 Contact with and (suspected) exposure to covid-19: Secondary | ICD-10-CM | POA: Diagnosis not present

## 2020-03-31 DIAGNOSIS — Z01812 Encounter for preprocedural laboratory examination: Secondary | ICD-10-CM | POA: Diagnosis not present

## 2020-03-31 LAB — SARS CORONAVIRUS 2 (TAT 6-24 HRS): SARS Coronavirus 2: NEGATIVE

## 2020-04-02 ENCOUNTER — Encounter (HOSPITAL_COMMUNITY): Payer: Self-pay | Admitting: Oral Surgery

## 2020-04-02 ENCOUNTER — Other Ambulatory Visit: Payer: Self-pay

## 2020-04-02 NOTE — Progress Notes (Addendum)
Anesthesia Chart Review: Donna Herrera   Case: [id number] Date/Time: 04/03/20 1034   Procedure: DENTAL RESTORATION/EXTRACTIONS (N/A )   Anesthesia type: General   Pre-op diagnosis: DENTAL CARIES   Location: [facility] OR ROOM 04 / [facility]   Surgeons: [name], DDS      DISCUSSION: Patient is a 74 year old female scheduled for the above procedure.  History includes former smoker (quit 11/01/15), chronic combined systolic and diastolic CHF, nonischemic cardiomyopathy (normal coronaries, EF 30-35% 2017), HTN, HLD, chronic lung disease, pre-diabetes, GERD, loss of taste (02/06/18), slipped capital femoral epiphysis syndrome (s/p multiple hip procedures including left hip fusion as teenager; removal of left hip nail plate, 2 washers 12/12/00), chronic pain.  Patient evaluated at [facility] on 03/25/20 by [name], PA-C. Medications had to be further reduced due to episodes of symptomatic hypotension. BP 108/72 at that visit. No chest pain. Breathing stable. Appetite not as good. Volume status overall stable. Follow-up in 4-6 weeks to BP evaluation/medication management planned. (Preoperative cardiology input previously outlined by Barrett, Loreta Ave and Dorris Carnes, MD in February 2021: "Given past medical history and time since last visit, based on ACC/AHA guidelines, Donna Herrera would be at acceptable risk for the planned procedure without further cardiovascular testing." Also given permission to hold ASA 5 days prior to surgery and resume when safe after surgery.)  A1c 6.3% on 03/24/20. CBC and BMET done on 03/25/2020.  03/31/2020 presurgical COVID-19 test was negative.  Anesthesia team to evaluate on the day of surgery.   VS:   Wt Readings from Last 3 Encounters:  03/25/20 80.7 kg  03/24/20 82.1 kg  02/26/20 80.7 kg   BP Readings from Last 3 Encounters:  03/25/20 108/72  03/24/20 102/62  02/26/20 129/70   Pulse Readings from Last 3 Encounters:  03/25/20 (!) 58  03/24/20 60   02/11/20 66    PROVIDERS: Danna Hefty, DO his PCP Dorris Carnes, MD is cardiologist   LABS: She had a CBD and BMET on 03/25/20. Results included: Lab Results  Component Value Date   WBC 4.6 03/25/2020   HGB 12.3 03/25/2020   HCT 36.8 03/25/2020   PLT 205 03/25/2020   GLUCOSE 89 03/25/2020   ALT 10 06/12/2019   AST 14 06/12/2019   NA 137 03/25/2020   K 5.2 03/25/2020   CL 98 03/25/2020   CREATININE 1.04 (H) 03/25/2020   BUN 21 03/25/2020   CO2 20 03/25/2020   HGBA1C 6.6 03/24/2020     EKG: 02/11/20 ([facility]): Sinus bradycardia at 57 bpm Normal Axis Low voltage QRS Occasional PVCs   CV: Echo 06/06/18 Study Conclusions  - Left ventricle: The cavity size was normal. Wall thickness was  increased in a pattern of moderate LVH. Systolic function was  mildly to moderately reduced. The estimated ejection fraction was  in the range of 40% to 45%. Diffuse hypokinesis. Doppler  parameters are consistent with abnormal left ventricular  relaxation (grade 1 diastolic dysfunction).  - Aortic valve: There was no regurgitation.  - Mitral valve: Calcified annulus. There was trivial regurgitation.  - Left atrium: The atrium was mildly dilated.  - Tricuspid valve: There was trivial regurgitation.  Impressions:  - Echo contrast used to better visualize wall motion and ejection  fraction. EF appears improved to 40-45% with diffuse hypokinesis.  Cannot estimate filling pressures due to lack of TR jet. Limited  study, IVC/PA not assessed.  (Comparison: 03/06/18: LVEF 15-20%, diffuse hypokinesis, grade 2 DD, normal RVSF, PASP 62; 10/05/15: mild LVH,  EF 35-40%, diffuse hypokinesis, grade 2 DD, normal RVSF, PASP 49)   LHC 05/11/16  There is severe left ventricular systolic dysfunction. 1. Normal coronary anatomy 2. Severe LV dysfunction with EF 30-35%. 3. Normal right heart and LV filling pressures. Plan: continue medical therapy.    Past Medical  History:  Diagnosis Date  . Allergy   . Arthritis   . Candidiasis, mouth 10/19/2017  . Chronic combined systolic and diastolic CHF (congestive heart failure) ([place]) 10/21/2015   Echo 3/16: EF 40%, diffuse HK, mild MR, moderate LAE, mild RAE, PASP 42 mmHg  //  Echo 12/16: Mild LVH, EF 35-40%, diffuse HK, Gr 2 DD, MAC, mild MR, mod LAE, normal RVSF, PASP 49 mmHg // Echo 5/19: EF 15-20, diff HK, Gr 2 DD, trivial AI, MAC, trivial MR, mod LAE, normal RVSF, mild TR, PASP 62 // Limited Echo 8/19: mod LVH, EF 40-45, diff HK, Gr 1 DD, MAC, mild LAE   . Chronic left shoulder pain 04/10/2013  . Chronic lung disease   . Chronic pain   . Chronic pain syndrome 07/20/2017  . Depression 08/25/2016  . DJD (degenerative joint disease) 10/12/2012   Multiple joint replacements by Dr. Collier Salina.   . Dyshidrotic eczema 12/31/2014  . Essential hypertension 07/20/2017  . GERD (gastroesophageal reflux disease)   . Gout   . H/O slipped capital femoral epiphysis (SCFE)   . History of total right hip replacement 04/02/2019   Followed closely with orthopedist Dr. Alvan Dame. Patient is s/p total right hip replacement in 1980's with evidence of bone loss and osteopenia making reconstruction unlikely. Plan for conservative treatment at this time with crutches for ambulation and pain control.  . Hyperlipidemia   . Hypertension   . Hypertensive heart disease with CHF (congestive heart failure) ([place]) 10/12/2012  . Loss of taste 02/06/2018  . Mass of breast, left 07/21/2014  . NICM (nonischemic cardiomyopathy) ([place]) 06/09/2016   A. LHC 7/17: Normal coronary arteries, EF 30-35%, LVEDP 14 mmHg  . Pain in gums 04/18/2018  . Pre-diabetes   . Right hip pain 11/02/2012  . Tendonitis, Achilles, left 05/24/2017  . Tobacco abuse 09/29/2014  . Vitamin D deficiency 12/31/2014    Past Surgical History:  Procedure Laterality Date  . CARDIAC CATHETERIZATION  2005   normal-[place]ochrein  . CARDIAC CATHETERIZATION N/A 05/11/2016   Procedure: Right/Left  Heart Cath and Coronary Angiography;  Surgeon: Peter M Martinique, MD;  Location: [facility];  Service: Cardiovascular;  Laterality: N/A;  . CESAREAN SECTION    . COLONOSCOPY  1990's ??  . HIP FUSION Left    left   . JOINT REPLACEMENT Bilateral 1991, 1997   [place]  . ROTATOR CUFF REPAIR     bilateral  . TOTAL HIP ARTHROPLASTY  1987   right. Dr. Collier Salina  . TUBAL LIGATION      MEDICATIONS: No current facility-administered medications for this encounter.   [name] albuterol (PROVENTIL HFA;VENTOLIN HFA) 108 (90 Base) MCG/ACT inhaler  . allopurinol (ZYLOPRIM) 100 MG tablet  . aspirin 81 MG tablet  . baclofen (LIORESAL) 10 MG tablet  . Capsaicin-Menthol-Methyl Sal (CAPSAICIN-METHYL SAL-MENTHOL) 0.025-1-12 % CREA  . carvedilol (COREG) 3.125 MG tablet  . Cholecalciferol (VITAMIN D3) 50 MCG (2000 UT) TABS  . colchicine 0.6 MG tablet  . fluticasone (FLONASE) 50 MCG/ACT nasal spray  . furosemide (LASIX) 20 MG tablet  . HYDROcodone-acetaminophen (NORCO/VICODIN) 5-325 MG tablet  . loratadine (CLARITIN) 10 MG tablet  . potassium chloride SA (KLOR-CON) 20 MEQ tablet  .  spironolactone (ALDACTONE) 25 MG tablet  . triamcinolone ointment (KENALOG) 0.5 %  . vitamin B-12 (CYANOCOBALAMIN) 500 MCG tablet    Myra Gianotti, PA-C Surgical Short Stay/Anesthesiology [facility] Phone [phone] [facility] Phone [phone] 04/02/2020 1:04 PM

## 2020-04-02 NOTE — Progress Notes (Addendum)
Ms Donna Herrera denies chest pain or shortness of breath. Patient tested negative for Covid_6/1/21_ and has been in quarantine since that time. [name] reports that she has no had low BP since the Amiodarone was stopped.  Patient checks BP at home- she said it has gone up; "yesterday it was 147/," patient does not remember diastolic number. Patient denies having any edema at this time.  I have asked anesthesiology PA-C to review chart.

## 2020-04-02 NOTE — Anesthesia Preprocedure Evaluation (Addendum)
Anesthesia Evaluation  Patient identified by MRN, date of birth, ID band Patient awake    Reviewed: Allergy & Precautions, NPO status , Patient's Chart, lab work & pertinent test results  Airway Mallampati: II  TM Distance: >3 FB Neck ROM: Full    Dental  (+) Poor Dentition   Pulmonary neg pulmonary ROS, former smoker,    Pulmonary exam normal breath sounds clear to auscultation       Cardiovascular hypertension, Pt. on medications +CHF  Normal cardiovascular exam Rhythm:Regular Rate:Normal     Neuro/Psych Depression negative neurological ROS  negative psych ROS   GI/Hepatic Neg liver ROS, GERD  ,  Endo/Other  diabetes  Renal/GU negative Renal ROS  negative genitourinary   Musculoskeletal  (+) Arthritis , Osteoarthritis,    Abdominal   Peds negative pediatric ROS (+)  Hematology negative hematology ROS (+)   Anesthesia Other Findings   Reproductive/Obstetrics negative OB ROS                             Anesthesia Physical Anesthesia Plan  ASA: III  Anesthesia Plan: General   Post-op Pain Management:    Induction: Intravenous  PONV Risk Score and Plan: 3 and Ondansetron, Dexamethasone, Midazolam and Treatment may vary due to age or medical condition  Airway Management Planned: Nasal ETT  Additional Equipment:   Intra-op Plan:   Post-operative Plan: Extubation in OR  Informed Consent: I have reviewed the patients History and Physical, chart, labs and discussed the procedure including the risks, benefits and alternatives for the proposed anesthesia with the patient or authorized representative who has indicated his/her understanding and acceptance.     Dental advisory given  Plan Discussed with: CRNA  Anesthesia Plan Comments: (PAT note written 04/02/2020 by Myra Gianotti, PA-C. )       Anesthesia Quick Evaluation

## 2020-04-03 ENCOUNTER — Other Ambulatory Visit: Payer: Self-pay

## 2020-04-03 ENCOUNTER — Ambulatory Visit (HOSPITAL_COMMUNITY): Payer: Medicare Other | Admitting: Vascular Surgery

## 2020-04-03 ENCOUNTER — Encounter (HOSPITAL_COMMUNITY): Payer: Self-pay | Admitting: Oral Surgery

## 2020-04-03 ENCOUNTER — Ambulatory Visit (HOSPITAL_COMMUNITY)
Admission: RE | Admit: 2020-04-03 | Discharge: 2020-04-03 | Disposition: A | Discharge status: Home / Self Care | Payer: Medicare Other | Attending: Oral Surgery | Admitting: Oral Surgery

## 2020-04-03 ENCOUNTER — Encounter (HOSPITAL_COMMUNITY)
Admission: RE | Disposition: A | Discharge status: Home / Self Care | Payer: Self-pay | Source: Home / Self Care | Attending: Oral Surgery

## 2020-04-03 DIAGNOSIS — Z79899 Other long term (current) drug therapy: Secondary | ICD-10-CM | POA: Insufficient documentation

## 2020-04-03 DIAGNOSIS — Z885 Allergy status to narcotic agent status: Secondary | ICD-10-CM | POA: Insufficient documentation

## 2020-04-03 DIAGNOSIS — E785 Hyperlipidemia, unspecified: Secondary | ICD-10-CM | POA: Insufficient documentation

## 2020-04-03 DIAGNOSIS — K219 Gastro-esophageal reflux disease without esophagitis: Secondary | ICD-10-CM | POA: Insufficient documentation

## 2020-04-03 DIAGNOSIS — I428 Other cardiomyopathies: Secondary | ICD-10-CM | POA: Insufficient documentation

## 2020-04-03 DIAGNOSIS — I110 Hypertensive heart disease with heart failure: Secondary | ICD-10-CM | POA: Diagnosis not present

## 2020-04-03 DIAGNOSIS — M1990 Unspecified osteoarthritis, unspecified site: Secondary | ICD-10-CM | POA: Insufficient documentation

## 2020-04-03 DIAGNOSIS — I5042 Chronic combined systolic (congestive) and diastolic (congestive) heart failure: Secondary | ICD-10-CM | POA: Diagnosis not present

## 2020-04-03 DIAGNOSIS — M109 Gout, unspecified: Secondary | ICD-10-CM | POA: Diagnosis not present

## 2020-04-03 DIAGNOSIS — K029 Dental caries, unspecified: Secondary | ICD-10-CM | POA: Insufficient documentation

## 2020-04-03 DIAGNOSIS — Z96641 Presence of right artificial hip joint: Secondary | ICD-10-CM | POA: Insufficient documentation

## 2020-04-03 DIAGNOSIS — Z7982 Long term (current) use of aspirin: Secondary | ICD-10-CM | POA: Insufficient documentation

## 2020-04-03 HISTORY — DX: Prediabetes: R73.03

## 2020-04-03 HISTORY — PX: TOOTH EXTRACTION: SHX859

## 2020-04-03 LAB — GLUCOSE, CAPILLARY
Glucose-Capillary: 90 mg/dL (ref 70–99)
Glucose-Capillary: 115 mg/dL — ABNORMAL HIGH (ref 70–99)

## 2020-04-03 SURGERY — DENTAL RESTORATION/EXTRACTIONS
Anesthesia: General

## 2020-04-03 MED ORDER — FENTANYL CITRATE (PF) 100 MCG/2ML IJ SOLN
INTRAMUSCULAR | Status: DC | PRN
  Administered 2020-04-03: 100 ug via INTRAVENOUS

## 2020-04-03 MED ORDER — SODIUM CHLORIDE 0.9 % IR SOLN
Status: DC | PRN
  Administered 2020-04-03: 1000 mL

## 2020-04-03 MED ORDER — OXYCODONE HCL 5 MG PO TABS
ORAL_TABLET | ORAL | Status: AC
  Administered 2020-04-03: 5 mg via ORAL
  Filled 2020-04-03: qty 1

## 2020-04-03 MED ORDER — FENTANYL CITRATE (PF) 250 MCG/5ML IJ SOLN: INTRAMUSCULAR | Status: DC | PRN

## 2020-04-03 MED ORDER — OXYCODONE HCL 5 MG/5ML PO SOLN: 5.0000 mg | Freq: Once | ORAL | Status: AC | PRN

## 2020-04-03 MED ORDER — LACTATED RINGERS IV SOLN: INTRAVENOUS | Status: DC

## 2020-04-03 MED ORDER — ORAL CARE MOUTH RINSE: 15.0000 mL | Freq: Once | OROMUCOSAL | Status: AC

## 2020-04-03 MED ORDER — HYDROMORPHONE HCL 1 MG/ML IJ SOLN: 0.2500 mg | INTRAMUSCULAR | Status: DC | PRN

## 2020-04-03 MED ORDER — ONDANSETRON HCL 4 MG/2ML IJ SOLN
INTRAMUSCULAR | Status: DC | PRN
  Administered 2020-04-03: 4 mg via INTRAVENOUS

## 2020-04-03 MED ORDER — FENTANYL CITRATE (PF) 100 MCG/2ML IJ SOLN
INTRAMUSCULAR | Status: AC
  Filled 2020-04-03: qty 2

## 2020-04-03 MED ORDER — AMOXICILLIN 500 MG PO CAPS
500.0000 mg | ORAL_CAPSULE | Freq: Three times a day (TID) | ORAL | 0 refills | Status: DC
Start: 2020-04-03 — End: 2020-04-24

## 2020-04-03 MED ORDER — CEFAZOLIN SODIUM-DEXTROSE 2-4 GM/100ML-% IV SOLN
2.0000 g | INTRAVENOUS | Status: AC
  Administered 2020-04-03: 2 g via INTRAVENOUS
  Filled 2020-04-03: qty 100

## 2020-04-03 MED ORDER — ROCURONIUM BROMIDE 10 MG/ML (PF) SYRINGE
PREFILLED_SYRINGE | INTRAVENOUS | Status: DC | PRN
  Administered 2020-04-03: 80 mg via INTRAVENOUS

## 2020-04-03 MED ORDER — HYDROMORPHONE HCL 1 MG/ML IJ SOLN
INTRAMUSCULAR | Status: AC
  Administered 2020-04-03: 0.25 mg via INTRAVENOUS
  Filled 2020-04-03: qty 1

## 2020-04-03 MED ORDER — SUGAMMADEX SODIUM 200 MG/2ML IV SOLN
INTRAVENOUS | Status: DC | PRN
  Administered 2020-04-03: 200 mg via INTRAVENOUS

## 2020-04-03 MED ORDER — PHENYLEPHRINE 40 MCG/ML (10ML) SYRINGE FOR IV PUSH (FOR BLOOD PRESSURE SUPPORT)
PREFILLED_SYRINGE | INTRAVENOUS | Status: DC | PRN
  Administered 2020-04-03: 80 ug via INTRAVENOUS

## 2020-04-03 MED ORDER — OXYCODONE-ACETAMINOPHEN 5-325 MG PO TABS: 1.0000 | ORAL_TABLET | ORAL | 0 refills | Status: DC | PRN

## 2020-04-03 MED ORDER — 0.9 % SODIUM CHLORIDE (POUR BTL) OPTIME
TOPICAL | Status: DC | PRN
  Administered 2020-04-03: 1000 mL

## 2020-04-03 MED ORDER — PROMETHAZINE HCL 25 MG/ML IJ SOLN: 6.2500 mg | INTRAMUSCULAR | Status: DC | PRN

## 2020-04-03 MED ORDER — OXYMETAZOLINE HCL 0.05 % NA SOLN
NASAL | Status: DC | PRN
  Administered 2020-04-03: 1 via NASAL

## 2020-04-03 MED ORDER — PROPOFOL 10 MG/ML IV BOLUS
INTRAVENOUS | Status: DC | PRN
  Administered 2020-04-03: 110 mg via INTRAVENOUS

## 2020-04-03 MED ORDER — CHLORHEXIDINE GLUCONATE 0.12 % MT SOLN
15.0000 mL | Freq: Once | OROMUCOSAL | Status: AC
  Administered 2020-04-03: 15 mL via OROMUCOSAL
  Filled 2020-04-03: qty 15

## 2020-04-03 MED ORDER — OXYCODONE HCL 5 MG PO TABS: 5.0000 mg | ORAL_TABLET | Freq: Once | ORAL | Status: AC | PRN

## 2020-04-03 MED ORDER — LIDOCAINE-EPINEPHRINE 2 %-1:100000 IJ SOLN
INTRAMUSCULAR | Status: DC | PRN
  Administered 2020-04-03: 10 mL via INTRADERMAL

## 2020-04-03 MED ORDER — LIDOCAINE-EPINEPHRINE 2 %-1:100000 IJ SOLN
INTRAMUSCULAR | Status: AC
  Filled 2020-04-03: qty 1

## 2020-04-03 MED ORDER — DEXAMETHASONE SODIUM PHOSPHATE 10 MG/ML IJ SOLN
INTRAMUSCULAR | Status: DC | PRN
  Administered 2020-04-03: 5 mg via INTRAVENOUS

## 2020-04-03 MED ORDER — LIDOCAINE HCL (CARDIAC) PF 100 MG/5ML IV SOSY
PREFILLED_SYRINGE | INTRAVENOUS | Status: DC | PRN
  Administered 2020-04-03: 80 mg via INTRAVENOUS

## 2020-04-03 SURGICAL SUPPLY — 39 items
BLADE SURG 15 STRL LF DISP TIS (BLADE) ×1 IMPLANT
BLADE SURG 15 STRL SS (BLADE) ×3
BUR CROSS CUT FISSURE 1.6 (BURR) ×2 IMPLANT
BUR CROSS CUT FISSURE 1.6MM (BURR) ×1
BUR EGG ELITE 4.0 (BURR) ×2 IMPLANT
BUR EGG ELITE 4.0MM (BURR) ×1
CANISTER SUCT 3000ML PPV (MISCELLANEOUS) ×3 IMPLANT
COVER SURGICAL LIGHT HANDLE (MISCELLANEOUS) ×3 IMPLANT
DECANTER SPIKE VIAL GLASS SM (MISCELLANEOUS) ×3 IMPLANT
DRAPE U-SHAPE 76X120 STRL (DRAPES) ×3 IMPLANT
GAUZE PACKING FOLDED 2  STR (GAUZE/BANDAGES/DRESSINGS) ×3
GAUZE PACKING FOLDED 2 STR (GAUZE/BANDAGES/DRESSINGS) ×1 IMPLANT
GLOVE BIO SURGEON STRL SZ 6.5 (GLOVE) ×1 IMPLANT
GLOVE BIO SURGEON STRL SZ7 (GLOVE) ×2 IMPLANT
GLOVE BIO SURGEON STRL SZ8 (GLOVE) ×3 IMPLANT
GLOVE BIO SURGEONS STRL SZ 6.5 (GLOVE) ×1
GLOVE BIOGEL PI IND STRL 6.5 (GLOVE) IMPLANT
GLOVE BIOGEL PI IND STRL 7.0 (GLOVE) IMPLANT
GLOVE BIOGEL PI INDICATOR 6.5 (GLOVE) ×2
GLOVE BIOGEL PI INDICATOR 7.0 (GLOVE) ×2
GOWN STRL REUS W/ TWL LRG LVL3 (GOWN DISPOSABLE) ×1 IMPLANT
GOWN STRL REUS W/ TWL XL LVL3 (GOWN DISPOSABLE) ×1 IMPLANT
GOWN STRL REUS W/TWL LRG LVL3 (GOWN DISPOSABLE) ×3
GOWN STRL REUS W/TWL XL LVL3 (GOWN DISPOSABLE) ×3
IV NS 1000ML (IV SOLUTION) ×3
IV NS 1000ML BAXH (IV SOLUTION) ×1 IMPLANT
KIT BASIN OR (CUSTOM PROCEDURE TRAY) ×3 IMPLANT
KIT TURNOVER KIT B (KITS) ×3 IMPLANT
NDL HYPO 25GX1X1/2 BEV (NEEDLE) ×2 IMPLANT
NEEDLE HYPO 25GX1X1/2 BEV (NEEDLE) ×6 IMPLANT
NS IRRIG 1000ML POUR BTL (IV SOLUTION) ×3 IMPLANT
PAD ARMBOARD 7.5X6 YLW CONV (MISCELLANEOUS) ×3 IMPLANT
SLEEVE IRRIGATION ELITE 7 (MISCELLANEOUS) ×3 IMPLANT
SPONGE SURGIFOAM ABS GEL 12-7 (HEMOSTASIS) IMPLANT
SUT CHROMIC 3 0 PS 2 (SUTURE) ×3 IMPLANT
SYR CONTROL 10ML LL (SYRINGE) ×3 IMPLANT
TRAY ENT MC OR (CUSTOM PROCEDURE TRAY) ×3 IMPLANT
TUBING IRRIGATION (MISCELLANEOUS) ×3 IMPLANT
YANKAUER SUCT BULB TIP NO VENT (SUCTIONS) ×3 IMPLANT

## 2020-04-03 NOTE — Op Note (Signed)
04/03/2020  11:47 AM  PATIENT:  Donna Herrera  74 y.o. female  PRE-OPERATIVE DIAGNOSIS: NON-RESTORABLE TEETH #2, 18, 20, 22, 23, 25, 26, 27, 29 SECONDARY TO DENTAL CARIES  POST-OPERATIVE DIAGNOSIS:  SAME  PROCEDURE:  Procedure(s):/EXTRACTION TEETH #2, 18, 20, 22, 23, 25, 26, 27, 29; ALVEOLOPLASTY MANDIBULAR RIGHT AND LEFT  SURGEON:  Surgeon(s): [name], DDS  ANESTHESIA:   local and general  EBL:  minimal  DRAINS: none   SPECIMEN:  No Specimen  COUNTS:  YES  PLAN OF CARE: Discharge to home after PACU  PATIENT DISPOSITION:  PACU - hemodynamically stable.   PROCEDURE DETAILS: Dictation # [id number]  [name], DMD 04/03/2020 11:47 AM

## 2020-04-03 NOTE — Anesthesia Postprocedure Evaluation (Signed)
Anesthesia Post Note  Patient: Donna Herrera  Procedure(s) Performed: DENTAL RESTORATION/EXTRACTIONS (N/A )     Patient location during evaluation: PACU Anesthesia Type: General Level of consciousness: awake and alert Pain management: pain level controlled Vital Signs Assessment: post-procedure vital signs reviewed and stable Respiratory status: spontaneous breathing, nonlabored ventilation and respiratory function stable Cardiovascular status: blood pressure returned to baseline and stable Postop Assessment: no apparent nausea or vomiting Anesthetic complications: no    Last Vitals:  Vitals:   04/03/20 1300 04/03/20 1315  BP: (!) 160/91 (!) 157/67  Pulse: (!) 59 61  Resp: 14 12  Temp: (!) 36.1 C   SpO2: 98% 97%    Last Pain:  Vitals:   04/03/20 1315  TempSrc:   PainSc: [place]

## 2020-04-03 NOTE — Discharge Instructions (Signed)
General Anesthesia, Adult, Care After This sheet gives you information about how to care for yourself after your procedure. Your health care provider may also give you more specific instructions. If you have problems or questions, contact your health care provider. What can I expect after the procedure? After the procedure, the following side effects are common:  Pain or discomfort at the IV site.  Nausea.  Vomiting.  Sore throat.  Trouble concentrating.  Feeling cold or chills.  Weak or tired.  Sleepiness and fatigue.  Soreness and body aches. These side effects can affect parts of the body that were not involved in surgery. Follow these instructions at home:  For at least 24 hours after the procedure:  Have a responsible adult stay with you. It is important to have someone help care for you until you are awake and alert.  Rest as needed.  Do not: ? Participate in activities in which you could fall or become injured. ? Drive. ? Use heavy machinery. ? Drink alcohol. ? Take sleeping pills or medicines that cause drowsiness. ? Make important decisions or sign legal documents. ? Take care of children on your own. Eating and drinking  Follow any instructions from your health care provider about eating or drinking restrictions.  When you feel hungry, start by eating small amounts of foods that are soft and easy to digest (bland), such as toast. Gradually return to your regular diet.  Drink enough fluid to keep your urine pale yellow.  If you vomit, rehydrate by drinking water, juice, or clear broth. General instructions  If you have sleep apnea, surgery and certain medicines can increase your risk for breathing problems. Follow instructions from your health care provider about wearing your sleep device: ? Anytime you are sleeping, including during daytime naps. ? While taking prescription pain medicines, sleeping medicines, or medicines that make you drowsy.  Return to  your normal activities as told by your health care provider. Ask your health care provider what activities are safe for you.  Take over-the-counter and prescription medicines only as told by your health care provider.  If you smoke, do not smoke without supervision.  Keep all follow-up visits as told by your health care provider. This is important. Contact a health care provider if:  You have nausea or vomiting that does not get better with medicine.  You cannot eat or drink without vomiting.  You have pain that does not get better with medicine.  You are unable to pass urine.  You develop a skin rash.  You have a fever.  You have redness around your IV site that gets worse. Get help right away if:  You have difficulty breathing.  You have chest pain.  You have blood in your urine or stool, or you vomit blood. Summary  After the procedure, it is common to have a sore throat or nausea. It is also common to feel tired.  Have a responsible adult stay with you for the first 24 hours after general anesthesia. It is important to have someone help care for you until you are awake and alert.  When you feel hungry, start by eating small amounts of foods that are soft and easy to digest (bland), such as toast. Gradually return to your regular diet.  Drink enough fluid to keep your urine pale yellow.  Return to your normal activities as told by your health care provider. Ask your health care provider what activities are safe for you. This information is not   intended to replace advice given to you by your health care provider. Make sure you discuss any questions you have with your health care provider. Document Revised: 10/20/2017 Document Reviewed: 06/02/2017 Elsevier Patient Education  2020 Elsevier Inc.  

## 2020-04-03 NOTE — H&P (Signed)
Anesthesia H&P Update: History and Physical Exam reviewed; patient is OK for planned anesthetic and procedure. ? ?

## 2020-04-03 NOTE — H&P (Signed)
H&P documentation  -History and Physical Reviewed  -Patient has been re-examined  -No change in the plan of care  Donna Herrera  

## 2020-04-03 NOTE — Transfer of Care (Signed)
Immediate Anesthesia Transfer of Care Note  Patient: Donna Herrera  Procedure(s) Performed: DENTAL RESTORATION/EXTRACTIONS (N/A )  Patient Location: PACU  Anesthesia Type:General  Level of Consciousness: awake and patient cooperative  Airway & Oxygen Therapy: Patient Spontanous Breathing and Patient connected to nasal cannula oxygen  Post-op Assessment: Report given to RN and Post -op Vital signs reviewed and stable  Post vital signs: Reviewed and stable  Last Vitals:  Vitals Value Taken Time  BP 143/68   Temp    Pulse 57 04/03/20 1200  Resp 12 04/03/20 1200  SpO2 100 % 04/03/20 1200  Vitals shown include unvalidated device data.  Last Pain:  Vitals:   04/03/20 1200  TempSrc:   PainSc: 0-No pain         Complications: No apparent anesthesia complications

## 2020-04-03 NOTE — Anesthesia Procedure Notes (Signed)
Procedure Name: Intubation Date/Time: 04/03/2020 11:03 AM Performed by: Raenette Rover, CRNA Pre-anesthesia Checklist: Patient identified, Emergency Drugs available, Suction available and Patient being monitored Patient Re-evaluated:Patient Re-evaluated prior to induction Oxygen Delivery Method: Circle system utilized Preoxygenation: Pre-oxygenation with 100% oxygen Induction Type: IV induction Ventilation: Mask ventilation without difficulty Laryngoscope Size: Mac and 3 Grade View: Grade I Nasal Tubes: Right, Magill forceps- large, utilized and Nasal prep performed Tube size: 7.0 mm Number of attempts: 1 Placement Confirmation: ETT inserted through vocal cords under direct vision,  positive ETCO2 and breath sounds checked- equal and bilateral Secured at: 26 cm Tube secured with: Tape Dental Injury: Teeth and Oropharynx as per pre-operative assessment

## 2020-04-03 NOTE — Op Note (Signed)
NAME: ZYKIRIA, BRUENING MEDICAL RECORD OJ:[id number] ACCOUNT [id number] DATE OF BIRTH:[birth year] FACILITY: [facility] LOCATION: [facility] PHYSICIAN:Nhung Danko M. Palma Buster, DDS  OPERATIVE REPORT  DATE OF PROCEDURE:  04/03/2020  PREOPERATIVE DIAGNOSIS:  Nonrestorable teeth secondary to dental caries numbers 2, 18, 20, 22, 23, 25, 26, 27, 29.  PROCEDURES:   1.  Extraction of teeth numbers 2, 18, 20, 22, 23, 25, 26, 27, 29. 2.  Alveoplasty mandibular right and left quadrants.  SURGEON:  [name], DDS  ANESTHESIA:  General, nasal intubation, Dr. Sabra Heck attending.  DESCRIPTION OF PROCEDURE:  The patient was taken to the operating room and placed on the table in supine position.  General anesthesia was administered and a nasal endotracheal tube was placed and secured.  The eyes were protected and the patient was  draped for surgery.  A timeout was performed.  The posterior pharynx was suctioned and a throat pack was placed.  Two percent lidocaine with 1:100,000 epinephrine was infiltrated in an inferior alveolar block on the right and left sides and in palatal  and buccal infiltration in the maxilla around tooth #2.  A bite block was placed on the right side of the mouth and a sweetheart retractor was used to retract the tongue.  A #15 blade was used to make an incision approximately 1 cm proximal to tooth #18  on the alveolar crest.  This incision was carried forward in the buccal gingival sulcus until tooth #27 was encountered and a second incision was made at the lingual gingival sulcus from around these teeth numbers 18, 20, 22, 23, 25, 26 and 27.  The  periosteum was reflected from around these teeth.  Tooth #18 was elevated with a 301 elevator and the socket was curetted.  Tooth #20 fractured upon attempted elevation, necessitating removal of circumferential bone around the tooth and the tooth was  grasped with forceps and removed from the mouth with the forceps.  Then, tooth #23 was removed in  routine fashion with the forceps.  Tooth #22 fractured upon attempted removal with the forceps and then additional bone was removed around the tooth until  the tooth could be removed with the dental forceps.  Then, the periosteum was reflected to expose the alveolar crest of the left mandible towards from the posterior to midline.  The bone was irregular.  An alveoplasty was performed using the egg-shaped  bur, followed by the bone file.  Then, the area was irrigated and closed with 3-0 chromic.  The bite block and sweetheart retractor were repositioned to the other side of the mouth.  The 15 blade was used to make an incision around teeth numbers 25, 26,  27 and around tooth #29, as well as around tooth #2 in the maxilla.  The periosteum was reflected from around these teeth.  Teeth #25 and #26 were removed in routine fashion with the dental forceps after elevating.  Tooth #27 fractured upon attempted  removal.  Then, bone was removed with a Stryker handpiece around the tooth circumferentially to expose more of the tooth and the tooth was removed using the dental forceps.  Tooth #29 was removed with the rongeur.  Tooth #2 was removed with the dental  forceps.  Then, the sockets were curetted.  The periosteum was reflected to expose the alveolar crest in the right mandible.  Alveoplasty was performed using the egg bur followed by the bone file.  Then, the area was irrigated and closed with 3-0  chromic.  The soft tissue around tooth #2  was closed with 3-0 chromic.  Then, the oral cavity was irrigated and suctioned.  The throat pack was removed.  The patient was left in care of anesthesia for extubation and transport to recovery room with plans  for discharge home through day surgery.  ESTIMATED BLOOD LOSS:  Minimal.  COMPLICATIONS:  None.  SPECIMENS:  None.  VN/NUANCE  D:04/03/2020 T:04/03/2020 JOB:[id number]

## 2020-04-05 ENCOUNTER — Other Ambulatory Visit: Payer: Self-pay | Admitting: Family Medicine

## 2020-04-05 ENCOUNTER — Other Ambulatory Visit: Payer: Self-pay | Admitting: Physician Assistant

## 2020-04-05 DIAGNOSIS — I5042 Chronic combined systolic (congestive) and diastolic (congestive) heart failure: Secondary | ICD-10-CM

## 2020-04-05 DIAGNOSIS — I428 Other cardiomyopathies: Secondary | ICD-10-CM

## 2020-04-15 ENCOUNTER — Other Ambulatory Visit: Payer: Self-pay | Admitting: *Deleted

## 2020-04-16 MED ORDER — OXYCODONE-ACETAMINOPHEN 5-325 MG PO TABS: 1.0000 | ORAL_TABLET | ORAL | 0 refills | Status: DC | PRN

## 2020-04-20 ENCOUNTER — Other Ambulatory Visit: Payer: Self-pay | Admitting: Family Medicine

## 2020-04-23 NOTE — Progress Notes (Signed)
Cardiology Office Note:    Date:  04/24/2020   ID:  [name], DOB 30-Aug-1946, MRN [id number]  PCP:  Danna Hefty, DO  Cardiologist:  Dorris Carnes, MD  Electrophysiologist:  None   Referring MD: Danna Hefty, DO   Chief Complaint:  Follow-up (CHF)    Patient Profile:    Emonni Depasquale is a 74 y.o. female with:   Chronic systolic CHF ? Echo 5/19: EF 15-20 ? Echo 8/19: EF 40-45  Non-Ischemic CM  Tobacco use  Hypertension   Hyperlipidemia  Slipped Capital Femoral Epiphysis syndrome ? S/p multiple hip procedures  Prior CV Studies: Echo 06/06/18 Mod LVH, EF 40-45, diff HK, Gr 1 DD, MAC, trivial MR, mild LAE, trivial TR  Echo 03/06/2018 EF 15-20, diffuse HK, grade 2 diastolic dysfunction, trivial AI, MAC, trivial MR, moderate LAE, normal RVSF, mild TR, PASP 62  LHC 05/11/16 1. Normal coronary anatomy 2. Severe LV dysfunction with EF 30-35%. 3. Normal right heart and LV filling pressures.  Echo 10/05/15 Mild LVH, EF 35-40%, diffuse HK, grade 2 diastolic dysfunction, MAC, mild MR, moderate LAE, normal RVSF, PASP 49 mmHg  Echo 01/16/15 EF 40%, diffuse HK, mild MR, moderate LAE, mild RAE, PASP 42  History of Present Illness:    Ms. Zahm was last seen in 02/2020.  Her CHF meds had been adjusted due to low BP.  She was off of Entresto and her Furosemide was dosed 3 x a week.  She returns for follow up.  She is here with her granddaughter.  Overall, she has been doing well.  She has not really had significant lightheaded episodes or low blood pressure.  She has COPD and a chronic cough.  She has not had orthopnea or significant lower extremity swelling.  She did have some chest discomfort yesterday that waxed and waned throughout the day.  She had indigestion symptoms and belching associated with this.  She notes waterbrash symptoms.  She has not had exertional chest discomfort.    Past Medical History:  Diagnosis Date  . Allergy     . Arthritis   . Candidiasis, mouth 10/19/2017  . Chronic combined systolic and diastolic CHF (congestive heart failure) ([place]) 10/21/2015   Echo 3/16: EF 40%, diffuse HK, mild MR, moderate LAE, mild RAE, PASP 42 mmHg  //  Echo 12/16: Mild LVH, EF 35-40%, diffuse HK, Gr 2 DD, MAC, mild MR, mod LAE, normal RVSF, PASP 49 mmHg // Echo 5/19: EF 15-20, diff HK, Gr 2 DD, trivial AI, MAC, trivial MR, mod LAE, normal RVSF, mild TR, PASP 62 // Limited Echo 8/19: mod LVH, EF 40-45, diff HK, Gr 1 DD, MAC, mild LAE   . Chronic left shoulder pain 04/10/2013  . Chronic lung disease   . Chronic pain   . Chronic pain syndrome 07/20/2017  . Depression 08/25/2016  . DJD (degenerative joint disease) 10/12/2012   Multiple joint replacements by Dr. Collier Salina.   . Dyshidrotic eczema 12/31/2014  . Essential hypertension 07/20/2017  . GERD (gastroesophageal reflux disease)   . Gout   . H/O slipped capital femoral epiphysis (SCFE)   . History of total right hip replacement 04/02/2019   Followed closely with orthopedist Dr. Alvan Dame. Patient is s/p total right hip replacement in 1980's with evidence of bone loss and osteopenia making reconstruction unlikely. Plan for conservative treatment at this time with crutches for ambulation and pain control.  . Hyperlipidemia   . Hypertension   . Hypertensive heart disease with CHF (congestive heart  failure) ([place]) 10/12/2012  . Loss of taste 02/06/2018  . Mass of breast, left 07/21/2014  . NICM (nonischemic cardiomyopathy) ([place]) 06/09/2016   A. LHC 7/17: Normal coronary arteries, EF 30-35%, LVEDP 14 mmHg  . Pain in gums 04/18/2018  . Pre-diabetes   . Right hip pain 11/02/2012  . Tendonitis, Achilles, left 05/24/2017  . Tobacco abuse 09/29/2014  . Vitamin D deficiency 12/31/2014    Current Medications: Current Meds  Medication Sig  . albuterol (PROVENTIL HFA;VENTOLIN HFA) 108 (90 Base) MCG/ACT inhaler Inhale 1-2 puffs into the lungs every 6 (six) hours as needed for wheezing.  [name]  allopurinol (ZYLOPRIM) 100 MG tablet Take 100 mg by mouth as needed (for gout).  [name] aspirin 81 MG tablet Take 1 tablet (81 mg total) by mouth daily.  . baclofen (LIORESAL) 10 MG tablet Take 10 mg by mouth as needed for muscle spasms.  . capsaicin (ZOSTRIX) 0.025 % cream Apply 1 application topically 2 (two) times daily.  . carvedilol (COREG) 3.125 MG tablet Take 1 tablet (3.125 mg total) by mouth 2 (two) times daily.  . Cholecalciferol (VITAMIN D3) 50 MCG (2000 UT) TABS Take 2,000 Units by mouth daily.  . colchicine 0.6 MG tablet Take 0.6 mg by mouth as needed (GOUT).  . fluticasone (FLONASE) 50 MCG/ACT nasal spray Place 2 sprays into both nostrils daily as needed for allergies.  . furosemide (LASIX) 20 MG tablet Take 1 tablet by mouth on Monday/Wednesday/Friday. Take an additional dose with Potassium if weight goes up 3 pounds or more in 1 day.  . loratadine (CLARITIN) 10 MG tablet Take 1 tablet (10 mg total) by mouth daily.  . potassium chloride SA (KLOR-CON) 20 MEQ tablet Take 1 tablet by mouth on Monday/Wednesday/Friday. Take an additional dose with Furosemide if weight goes up 3 pounds or more in 1 day.  . spironolactone (ALDACTONE) 25 MG tablet Take 0.5 tablets (12.5 mg total) by mouth daily.  [name] triamcinolone ointment (KENALOG) 0.5 % Apply 1 application topically 2 (two) times daily. To palms  . vitamin B-12 (CYANOCOBALAMIN) 500 MCG tablet Take 500 mcg by mouth daily.      Allergies:   Crab [shellfish allergy] and Morphine and related   Social History   Tobacco Use  . Smoking status: Former Smoker    Packs/day: 0.50    Years: 55.00    Pack years: 27.50    Quit date: 2017    Years since quitting: 4.4  . Smokeless tobacco: Never Used  Vaping Use  . Vaping Use: Never used  Substance Use Topics  . Alcohol use: Not Currently    Comment: on holidays  . Drug use: No     Family Hx: The patient's family history includes Cancer in her father; Depression in her mother; Diabetes in her  mother and sister; Heart disease in her mother; Hypertension in her mother; Stomach cancer in her brother; Stroke in her mother; Throat cancer in her son. There is no history of Breast cancer, Esophageal cancer, Colon cancer, or Colon polyps.  ROS   EKGs/Labs/Other Test Reviewed:    EKG:  EKG is not ordered today.  The ekg ordered today demonstrates n/a  Recent Labs: 06/12/2019: ALT 10 07/16/2019: Magnesium 2.1 11/04/2019: NT-Pro BNP 291 03/25/2020: BUN 21; Creatinine, Ser 1.04; Hemoglobin 12.3; Platelets 205; Potassium 5.2; Sodium 137   Recent Lipid Panel Lab Results  Component Value Date/Time   CHOL 177 10/04/2015 01:41 AM   TRIG 78 10/04/2015 01:41 AM   HDL 50  10/04/2015 01:41 AM   CHOLHDL 3.5 10/04/2015 01:41 AM   LDLCALC 111 (H) 10/04/2015 01:41 AM    Physical Exam:    VS:  BP 128/74   Pulse (!) 58   Ht 5' (1.524 m)   Wt 182 lb (82.6 kg)   SpO2 98%   BMI 35.54 kg/m     Wt Readings from Last 3 Encounters:  04/24/20 182 lb (82.6 kg)  04/03/20 178 lb (80.7 kg)  03/25/20 178 lb (80.7 kg)     Constitutional:      Appearance: Healthy appearance. Not in distress.  Neck:     Vascular: JVD normal.  Pulmonary:     Effort: Pulmonary effort is normal.     Breath sounds: No wheezing. No rales.  Cardiovascular:     Normal rate. Regular rhythm. Normal S1. Normal S2.     Murmurs: There is no murmur.  Edema:    Ankle: bilateral trace edema of the ankle. Abdominal:     Palpations: Abdomen is soft.  Skin:    General: Skin is warm and dry.  Neurological:     Mental Status: Alert and oriented to person, place and time.     Cranial Nerves: Cranial nerves are intact.      ASSESSMENT & PLAN:    1. Chronic combined systolic and diastolic CHF (congestive heart failure) (H[facility]) Nonischemic cardiomyopathy.  NYHA II-IIb.  Volume status stable.  Her blood pressures have remained stable since last seen.  I have recommended that we try her back on ACE inhibitor therapy with lisinopril  2.5 mg daily.  She does have a cough related to COPD.  She also notes intolerance to losartan.  If her cough worsens, we may need to stop this and try low-dose hydralazine.  Continue current dose of carvedilol, spironolactone.  Obtain BMET in 2 weeks.  2. Hypertensive heart disease with chronic combined systolic and diastolic congestive heart failure ([place]) The patient's blood pressure is controlled on her current regimen.  Continue current therapy.   3. Other chest pain She has had some chest discomfort recently that is not typical for angina.  She has associated symptoms of dyspepsia.  I suspect that this is all related to a gastrointestinal etiology.  She had normal coronary arteries by cardiac catheterization 4 years ago.  I have encouraged her to use antiacid therapy.  I have also encouraged her to contact us if she has recurrent chest symptoms.  At that point, I would set her up for a [place].  Dispo:  Return in about 6 months (around 10/24/2020) for w/ Dr. Harrington Challenger, or [name], PA-C, in person.   Medication Adjustments/Labs and Tests Ordered: Current medicines are reviewed at length with the patient today.  Concerns regarding medicines are outlined above.  Tests Ordered: Orders Placed This Encounter  Procedures  . Basic metabolic panel   Medication Changes: Meds ordered this encounter  Medications  . lisinopril (ZESTRIL) 2.5 MG tablet    Sig: Take 1 tablet (2.5 mg total) by mouth daily.    Dispense:  90 tablet    Refill:  3    Signed, [name], PA-C  04/24/2020 12:47 PM    [address], [place], [place]  [address] Phone: [phone]; Fax: [phone]

## 2020-04-24 ENCOUNTER — Other Ambulatory Visit: Payer: Self-pay

## 2020-04-24 ENCOUNTER — Encounter: Payer: Self-pay | Admitting: Physician Assistant

## 2020-04-24 ENCOUNTER — Ambulatory Visit: Payer: Medicare Other | Admitting: Physician Assistant

## 2020-04-24 VITALS — BP 128/74 | HR 58 | Ht 60.0 in | Wt 182.0 lb

## 2020-04-24 DIAGNOSIS — I5042 Chronic combined systolic (congestive) and diastolic (congestive) heart failure: Secondary | ICD-10-CM | POA: Diagnosis not present

## 2020-04-24 DIAGNOSIS — I110 Hypertensive heart disease with heart failure: Secondary | ICD-10-CM | POA: Diagnosis not present

## 2020-04-24 DIAGNOSIS — R0789 Other chest pain: Secondary | ICD-10-CM | POA: Diagnosis not present

## 2020-04-24 MED ORDER — LISINOPRIL 2.5 MG PO TABS: 2.5000 mg | ORAL_TABLET | Freq: Every day | ORAL | 3 refills | Status: DC

## 2020-04-24 NOTE — Patient Instructions (Addendum)
Medication Instructions:   Your physician has recommended you make the following change in your medication:   1) Start Lisinopril 2.5 mg, 1 tablet by mouth once a day  *If you need a refill on your cardiac medications before your next appointment, please call your pharmacy*  Lab Work:  Your physician recommends that you return for lab work in 2 weeks for a BMET on 05/08/20  Testing/Procedures:  None ordered today  Follow-Up: At [facility], you and your health needs are our priority.  As part of our continuing mission to provide you with exceptional heart care, we have created designated Provider Care Teams.  These Care Teams include your primary Cardiologist (physician) and Advanced Practice Providers (APPs -  Physician Assistants and Nurse Practitioners) who all work together to provide you with the care you need, when you need it.  On 10/09/20 at 1:20PM with Dorris Carnes, MD  Other Instructions  Take Pepcid as needed for indigestion

## 2020-04-27 ENCOUNTER — Other Ambulatory Visit: Payer: Self-pay | Admitting: Physician Assistant

## 2020-05-06 ENCOUNTER — Telehealth: Payer: Self-pay | Admitting: Internal Medicine

## 2020-05-06 NOTE — Telephone Encounter (Signed)
Left message for patient to call back  

## 2020-05-06 NOTE — Telephone Encounter (Signed)
Pt is BP is elevating. Current BP 177/80, 164/75. Last week BP was 148/74. Please call to discuss

## 2020-05-06 NOTE — Telephone Encounter (Signed)
Spoke with the patient who states that her blood pressure has been increased today. She took it this morning prior to taking her medications and it was 177/80. She retook BP about 45 minutes after taking medications and it was 164/75 with HR 65. She denies any chest pain or shortness of breath. States that she does have a headache. Confirmed that she is taking all of her medications as prescribed. Patient will continue to monitor BP. I advised her it is best to take BP a couple hours after taking morning medications. Patient verbalized understanding.Will route to Dr. Harrington Challenger for further recommendations.

## 2020-05-06 NOTE — Telephone Encounter (Signed)
Porcia is returning [place] call.

## 2020-05-08 ENCOUNTER — Other Ambulatory Visit: Payer: Medicare Other | Admitting: *Deleted

## 2020-05-08 ENCOUNTER — Other Ambulatory Visit: Payer: Self-pay

## 2020-05-08 DIAGNOSIS — I5042 Chronic combined systolic (congestive) and diastolic (congestive) heart failure: Secondary | ICD-10-CM | POA: Diagnosis not present

## 2020-05-08 DIAGNOSIS — I110 Hypertensive heart disease with heart failure: Secondary | ICD-10-CM

## 2020-05-08 LAB — BASIC METABOLIC PANEL
BUN/Creatinine Ratio: 18 (ref 12–28)
BUN: 15 mg/dL (ref 8–27)
CO2: 23 mmol/L (ref 20–29)
Calcium: 9.1 mg/dL (ref 8.7–10.3)
Chloride: 101 mmol/L (ref 96–106)
Creatinine, Ser: 0.83 mg/dL (ref 0.57–1.00)
GFR calc Af Amer: 80 mL/min/{1.73_m2} (ref >59)
GFR calc non Af Amer: 70 mL/min/{1.73_m2} (ref >59)
Glucose: 87 mg/dL (ref 65–99)
Potassium: 4.2 mmol/L (ref 3.5–5.2)
Sodium: 138 mmol/L (ref 134–144)

## 2020-05-08 MED ORDER — ENTRESTO 24-26 MG PO TABS: 1.0000 | ORAL_TABLET | Freq: Two times a day (BID) | ORAL | 3 refills | Status: DC

## 2020-05-08 NOTE — Telephone Encounter (Signed)
I would like patient to double Entresto dose  Note that she came in for labs today   REsults are still pending .

## 2020-05-08 NOTE — Telephone Encounter (Signed)
I would resume Entresto at 24/26   Had stopped due to low BP    Take tonight and follow

## 2020-05-08 NOTE — Telephone Encounter (Signed)
Spoke with the patient and she reports BP toady at 120/66.

## 2020-05-08 NOTE — Telephone Encounter (Signed)
Spoke with the patient and she will start back on Entresto 24-26 BID.

## 2020-05-11 ENCOUNTER — Telehealth: Payer: Self-pay | Admitting: *Deleted

## 2020-05-11 DIAGNOSIS — I5042 Chronic combined systolic (congestive) and diastolic (congestive) heart failure: Secondary | ICD-10-CM

## 2020-05-11 NOTE — Telephone Encounter (Signed)
I s/w the pt today and went over recommendations per  Liliane Shi, PA-C  You; Fay Records, MD 6 minutes ago (12:31 PM)     Arbie Cookey I had recently placed her on Lisinopril. We had stopped Entresto due to low BP. She should not be on Lisinopril and Entresto. Can you find out if she is taking both?  [name], PA-C 12:30 05/11/2020      I did confirm with the pt that she has been taking both the Lisinopril and Entresto because she said she was advised on Friday 05/08/20 by [name], PAC to re-start Entresto. I went over the plan of care with the pt that she needs to stop the [facility] and only take the Lisinopril, and not to take both of these medications. I asked to pt to monitor her BP over the next few weeks and call in BP readings to [name], [facility] and his [place]. Pt is thankful for the call. I will remove the Entresto from her med list if not removed already.

## 2020-05-11 NOTE — Telephone Encounter (Signed)
BP 137/73  Pt took Entresto and Lisinopril this weekend  DIdnt take anything today   Says she got a call from someone today who told her not to take Delene Loll   Now she is afraid to take anything   Shewas upset on phone    REcomm:  Don't take either  She has been set up to have labs tomorrow  Keep tabs of BP    Will call after labs on what to take    As a rule I told her not to take both in future at same time

## 2020-05-11 NOTE — Telephone Encounter (Signed)
-----   Message from Liliane Shi, [place] sent at 05/09/2020  6:30 AM EDT ----- Creatinine, K+ normal.  PLAN:   - Continue current medications/treatment plan and follow up as scheduled. [name], PA-C    05/09/2020 6:29 AM

## 2020-05-11 NOTE — Telephone Encounter (Signed)
Pt notified of lab results by phone with verbal understanding. Pt states she needs to know if she is to continue on Entresto that [employer] re-started last Friday 05/08/20. I assured the pt that I will send my note to [name], [facility] and his [place]. Raquel Sarna will call back with recommendations. Pt thanked me for the call. Patient notified of result.  Please refer to phone note from today for complete details.   Julaine Hua, [place] 05/11/2020 9:40 AM

## 2020-05-11 NOTE — Telephone Encounter (Signed)
Arbie Cookey I had recently placed her on Lisinopril. We had stopped Entresto due to low BP. She should not be on Lisinopril and Entresto. Can you find out if she is taking both?  [name], PA-C 12:30 05/11/2020

## 2020-05-11 NOTE — Addendum Note (Signed)
Addended by: Michae Kava on: 05/11/2020 01:57 PM   Modules accepted: Orders

## 2020-05-11 NOTE — Telephone Encounter (Signed)
Thanks Arbie Cookey.  Raquel Sarna, please arrange a repeat BMET in the next 1-2 days.  Thanks  [name], [place] 13:49 05/11/2020

## 2020-05-11 NOTE — Telephone Encounter (Signed)
I s/w the pt and went over recommendation for lab work (BMET) in the next 1-2 days. Pt is agreeable and will come in tomorrow 05/12/20 for lab work. Order placed and appt has been made. Pt again verified she is not to take the [facility] any longer; I confirmed this with the pt.

## 2020-05-12 ENCOUNTER — Other Ambulatory Visit: Payer: Medicare Other

## 2020-05-12 ENCOUNTER — Other Ambulatory Visit: Payer: Self-pay

## 2020-05-12 DIAGNOSIS — I5042 Chronic combined systolic (congestive) and diastolic (congestive) heart failure: Secondary | ICD-10-CM

## 2020-05-12 LAB — BASIC METABOLIC PANEL
BUN/Creatinine Ratio: 14 (ref 12–28)
BUN: 12 mg/dL (ref 8–27)
CO2: 22 mmol/L (ref 20–29)
Calcium: 9.4 mg/dL (ref 8.7–10.3)
Chloride: 103 mmol/L (ref 96–106)
Creatinine, Ser: 0.88 mg/dL (ref 0.57–1.00)
GFR calc Af Amer: 75 mL/min/{1.73_m2} (ref >59)
GFR calc non Af Amer: 65 mL/min/{1.73_m2} (ref >59)
Glucose: 96 mg/dL (ref 65–99)
Potassium: 4.3 mmol/L (ref 3.5–5.2)
Sodium: 138 mmol/L (ref 134–144)

## 2020-05-18 ENCOUNTER — Other Ambulatory Visit: Payer: Self-pay | Admitting: Student in an Organized Health Care Education/Training Program

## 2020-05-26 ENCOUNTER — Ambulatory Visit: Payer: Medicare Other | Admitting: Physician Assistant

## 2020-06-02 ENCOUNTER — Telehealth: Payer: Self-pay | Admitting: Internal Medicine

## 2020-06-02 MED ORDER — HYDRALAZINE HCL 10 MG PO TABS
10.0000 mg | ORAL_TABLET | Freq: Three times a day (TID) | ORAL | 1 refills | Status: DC
Start: 2020-06-02 — End: 2020-06-29

## 2020-06-02 NOTE — Telephone Encounter (Signed)
Pt c/o medication issue:  1. Name of Medication: lisinopril (ZESTRIL) 2.5 MG tablet  2. How are you currently taking this medication (dosage and times per day)? 1 tablet a day  3. Are you having a reaction (difficulty breathing--STAT)? no  4. What is your medication issue? Minette Brine, RN, [employer] calling to inform the patient had a little cough prior to starting the medication. She states it has gotten worse since starting it. She would like the patient to receive a call back.

## 2020-06-02 NOTE — Telephone Encounter (Signed)
DC Lisinopril Start Hydralazine 10 mg three times a day. [name], PA-C    06/02/2020 5:12 PM

## 2020-06-02 NOTE — Telephone Encounter (Signed)
I called and spoke with patient, she is aware to stop Lisinopril and start Hydralazine 10 mg, 1 tablet by mouth three times a day. Prescription for Hydralazine sent to patients pharmacy. Patient verbalized understanding and thanked me for the call.

## 2020-06-04 ENCOUNTER — Telehealth: Payer: Self-pay | Admitting: Physician Assistant

## 2020-06-04 NOTE — Telephone Encounter (Signed)
Spoke with patient. She wanted to make sure about taking hydralazine and that she was to stop lisinopril.  She took lisinopril today and will not take any more.  Will start hydralazine 10 mg tid tomorrow.

## 2020-06-04 NOTE — Telephone Encounter (Signed)
° ° °  Pt c/o medication issue:  1. Name of Medication: hydrALAZINE (APRESOLINE) 10 MG tablet  2. How are you currently taking this medication (dosage and times per day)?   3. Are you having a reaction (difficulty breathing--STAT)?   4. What is your medication issue? Pt would like to ask if she need to drink this new medication with her other heart meds.

## 2020-06-12 ENCOUNTER — Other Ambulatory Visit: Payer: Self-pay

## 2020-06-12 MED ORDER — ONETOUCH DELICA LANCING DEV MISC: 0 refills | Status: DC

## 2020-06-12 MED ORDER — ONETOUCH VERIO VI STRP: ORAL_STRIP | 12 refills | Status: DC

## 2020-06-12 MED ORDER — ONETOUCH VERIO W/DEVICE KIT: PACK | 0 refills | Status: DC

## 2020-06-12 MED ORDER — ONETOUCH DELICA LANCETS 33G MISC: 3 refills | Status: DC

## 2020-06-12 NOTE — Telephone Encounter (Signed)
Patient calls nurse line requesting glucometer and supplies. Pended insurance preferred supplies. Patient is also requesting refill of hydrocodone- acetaminophen for hip pain. Not on current medication list. Please advise  To PCP  Talbot Grumbling, RN

## 2020-06-15 ENCOUNTER — Ambulatory Visit (HOSPITAL_COMMUNITY)
Admission: EM | Admit: 2020-06-15 | Discharge: 2020-06-15 | Discharge status: Against Medical Advice | Payer: Medicare Other

## 2020-06-15 ENCOUNTER — Other Ambulatory Visit: Payer: Self-pay

## 2020-06-17 ENCOUNTER — Other Ambulatory Visit: Payer: Self-pay

## 2020-06-17 DIAGNOSIS — G894 Chronic pain syndrome: Secondary | ICD-10-CM

## 2020-06-17 DIAGNOSIS — Z96641 Presence of right artificial hip joint: Secondary | ICD-10-CM

## 2020-06-17 MED ORDER — HYDROCODONE-ACETAMINOPHEN 5-325 MG PO TABS: 1.0000 | ORAL_TABLET | Freq: Four times a day (QID) | ORAL | 0 refills | Status: DC | PRN

## 2020-06-17 NOTE — Telephone Encounter (Signed)
Patient LVM on nurse line requesting a refill on her pain medication. However, I do not see an active one on her medication list. Please advise.

## 2020-06-17 NOTE — Telephone Encounter (Signed)
Please inform patient rx for pain has been sent to pharmacy

## 2020-06-18 NOTE — Telephone Encounter (Signed)
Pt informed. Velmer Woelfel, CMA  

## 2020-06-21 ENCOUNTER — Other Ambulatory Visit: Payer: Self-pay | Admitting: Physician Assistant

## 2020-06-21 DIAGNOSIS — I5042 Chronic combined systolic (congestive) and diastolic (congestive) heart failure: Secondary | ICD-10-CM

## 2020-06-21 DIAGNOSIS — I428 Other cardiomyopathies: Secondary | ICD-10-CM

## 2020-06-23 ENCOUNTER — Ambulatory Visit: Payer: Medicare Other | Admitting: Family Medicine

## 2020-06-27 ENCOUNTER — Other Ambulatory Visit: Payer: Self-pay | Admitting: Physician Assistant

## 2020-07-01 DIAGNOSIS — I10 Essential (primary) hypertension: Secondary | ICD-10-CM | POA: Diagnosis not present

## 2020-07-01 DIAGNOSIS — U071 COVID-19: Secondary | ICD-10-CM | POA: Diagnosis not present

## 2020-07-03 ENCOUNTER — Telehealth: Payer: Self-pay

## 2020-07-03 NOTE — Telephone Encounter (Signed)
Received phone call from [facility] representative due to patient testing positive for COVID. Per representative, patient testing positive on 07/01/2020 and they wanted to make provider aware.   Please advise if patient would be a good candidate for monoclonal antibody therapy.   To PCP  Talbot Grumbling, RN

## 2020-07-07 ENCOUNTER — Other Ambulatory Visit: Payer: Self-pay | Admitting: Adult Health

## 2020-07-07 ENCOUNTER — Other Ambulatory Visit: Payer: Self-pay | Admitting: Family Medicine

## 2020-07-07 DIAGNOSIS — U071 COVID-19: Secondary | ICD-10-CM

## 2020-07-07 MED ORDER — BUDESONIDE-FORMOTEROL FUMARATE 160-4.5 MCG/ACT IN AERO: 2.0000 | INHALATION_SPRAY | Freq: Two times a day (BID) | RESPIRATORY_TRACT | 0 refills | Status: DC

## 2020-07-07 NOTE — Progress Notes (Signed)
I connected by phone with Donna Herrera on 07/07/2020 at 7:51 PM to discuss the potential use of a new treatment for mild to moderate COVID-19 viral infection in non-hospitalized patients.  This patient is a 74 y.o. female that meets the FDA criteria for Emergency Use Authorization of COVID monoclonal antibody casirivimab/imdevimab.  Has a (+) direct SARS-CoV-2 viral test result  Has mild or moderate COVID-19   Is NOT hospitalized due to COVID-19  Is within 10 days of symptom onset  Has at least one of the high risk factor(s) for progression to severe COVID-19 and/or hospitalization as defined in EUA.  Specific high risk criteria : Older age (>/= 75 yo)   I have spoken and communicated the following to the patient or parent/caregiver regarding COVID monoclonal antibody treatment:  1. FDA has authorized the emergency use for the treatment of mild to moderate COVID-19 in adults and pediatric patients with positive results of direct SARS-CoV-2 viral testing who are 40 years of age and older weighing at least 40 kg, and who are at high risk for progressing to severe COVID-19 and/or hospitalization.  2. The significant known and potential risks and benefits of COVID monoclonal antibody, and the extent to which such potential risks and benefits are unknown.  3. Information on available alternative treatments and the risks and benefits of those alternatives, including clinical trials.  4. Patients treated with COVID monoclonal antibody should continue to self-isolate and use infection control measures (e.g., wear mask, isolate, social distance, avoid sharing personal items, clean and disinfect "high touch" surfaces, and frequent handwashing) according to CDC guidelines.   5. The patient or parent/caregiver has the option to accept or refuse COVID monoclonal antibody treatment.  After reviewing this information with the patient, The patient agreed to proceed with receiving  casirivimab\imdevimab infusion and will be provided a copy of the Fact sheet prior to receiving the infusion.  Set up for 9/8 at 1030. Will bring copy of test .   Shulem Mader 07/07/2020 7:51 PM

## 2020-07-07 NOTE — Telephone Encounter (Signed)
Spoke to patient after being informed of her positive COVID test. She has been vaccinated x 2. So far has only had mild symptoms. Denies any SOB. She is open to receiving MAB infusion. Symptoms began 8/31. Secure chat message to "MAB Infusions" placed. Attempted to call and leave message without success. Will also do trial of Symbicort BID x 14 days. Patient informed that if Symbicort is too expensive she does not have to buy it as it is only to help with symptoms and prevent worsening symptoms. She voiced understanding and agreement with plan. Discussed red flag symptoms and strict return precautions.

## 2020-07-08 ENCOUNTER — Ambulatory Visit (HOSPITAL_COMMUNITY)
Admission: RE | Admit: 2020-07-08 | Discharge: 2020-07-08 | Disposition: A | Discharge status: Home / Self Care | Payer: Medicare Other | Source: Ambulatory Visit | Attending: Pulmonary Disease | Admitting: Pulmonary Disease

## 2020-07-08 ENCOUNTER — Encounter (HOSPITAL_COMMUNITY): Payer: Self-pay

## 2020-07-08 DIAGNOSIS — Z23 Encounter for immunization: Secondary | ICD-10-CM | POA: Diagnosis not present

## 2020-07-08 DIAGNOSIS — U071 COVID-19: Secondary | ICD-10-CM | POA: Diagnosis present

## 2020-07-08 MED ORDER — SODIUM CHLORIDE 0.9 % IV SOLN
1200.0000 mg | Freq: Once | INTRAVENOUS | Status: AC
  Administered 2020-07-08: 1200 mg via INTRAVENOUS
  Filled 2020-07-08: qty 10

## 2020-07-08 MED ORDER — DIPHENHYDRAMINE HCL 50 MG/ML IJ SOLN: 50.0000 mg | Freq: Once | INTRAMUSCULAR | Status: DC | PRN

## 2020-07-08 MED ORDER — METHYLPREDNISOLONE SODIUM SUCC 125 MG IJ SOLR: 125.0000 mg | Freq: Once | INTRAMUSCULAR | Status: DC | PRN

## 2020-07-08 MED ORDER — SODIUM CHLORIDE 0.9 % IV SOLN: INTRAVENOUS | Status: DC | PRN

## 2020-07-08 MED ORDER — ALBUTEROL SULFATE HFA 108 (90 BASE) MCG/ACT IN AERS: 2.0000 | INHALATION_SPRAY | Freq: Once | RESPIRATORY_TRACT | Status: DC | PRN

## 2020-07-08 MED ORDER — FAMOTIDINE IN NACL 20-0.9 MG/50ML-% IV SOLN: 20.0000 mg | Freq: Once | INTRAVENOUS | Status: DC | PRN

## 2020-07-08 MED ORDER — EPINEPHRINE 0.3 MG/0.3ML IJ SOAJ: 0.3000 mg | Freq: Once | INTRAMUSCULAR | Status: DC | PRN

## 2020-07-08 NOTE — Progress Notes (Signed)
°  Diagnosis: COVID-19  Physician: Dr. Joya Gaskins  Procedure: Cov[facility] Infusion Clinic Med: casirivimab\imdevimab infusion - Provided patient with casirivimab\imdevimab fact sheet for patients, parents and caregivers prior to infusion.  Complications: No immediate complications noted.  Discharge: Discharged home   Donna Herrera, [name] 07/08/2020

## 2020-07-08 NOTE — Discharge Instructions (Signed)

## 2020-07-21 ENCOUNTER — Ambulatory Visit (INDEPENDENT_AMBULATORY_CARE_PROVIDER_SITE_OTHER): Payer: Medicare Other | Admitting: Family Medicine

## 2020-07-21 ENCOUNTER — Other Ambulatory Visit: Payer: Self-pay

## 2020-07-21 ENCOUNTER — Encounter: Payer: Self-pay | Admitting: Family Medicine

## 2020-07-21 VITALS — BP 127/70 | HR 71 | Ht 68.0 in | Wt 180.8 lb

## 2020-07-21 DIAGNOSIS — J029 Acute pharyngitis, unspecified: Secondary | ICD-10-CM

## 2020-07-21 DIAGNOSIS — R05 Cough: Secondary | ICD-10-CM

## 2020-07-21 DIAGNOSIS — R252 Cramp and spasm: Secondary | ICD-10-CM | POA: Diagnosis not present

## 2020-07-21 DIAGNOSIS — R059 Cough, unspecified: Secondary | ICD-10-CM

## 2020-07-21 NOTE — Progress Notes (Signed)
° °  Subjective:   Patient ID: Aneyah Lortz    DOB: 09/07/1946, 74 y.o. female   MRN: [id number]  Simmie Garin is a 74 y.o. female with a history of nonischemic cardiomyopathy, HTN heart disease with CHF, HTN, combined CHF, chronic lung disease, GERD, DJD, Vitamin D deficiency, HLD, gout, chronic pain, depression here for check up but found to have cough and sore throat.  Sore Throat   Cough: Patient presents to clinic today for alternative complaint but found to have sore throat and cough for 2 days.  She is currently vaccinated x2 but has a history of breakthrough Covid infection.  Denies any chest pain or shortness of breath.  Leg Cramps: Patient endorses leg cramps when taking Lasix.  She has self discontinued this medication but has continued to take her potassium as prescribed.  She denies any leg swelling or shortness of breath. She endorses improvement in leg swelling since discontinuing the lasix.   Review of Systems:  Per HPI.   Objective:   BP 127/70    Pulse 71    Ht 5\' 8"  (1.727 m)    Wt 180 lb 12.8 oz (82 kg)    SpO2 95%    BMI 27.49 kg/m  Vitals and nursing note reviewed.  General: pleasant elderly female, appears comfortable standing along exam bed, in no acute distress with non-toxic appearance HEENT: normocephalic, atraumatic, moist mucous membranes, oropharynx clear without erythema or exudate, TM normal bilaterally  Neck: supple, non-tender without lymphadenopathy Resp: breathing comfortably on room air, speaking in full sentences Skin: warm, dry MSK:  Antalgic gait at baseline, uses crutches due to chronic hip pain Neuro: Alert and oriented, speech normal  Assessment & Plan:   Cough   Sore throat: Patient presents to clinic with new cough and sore throat. COVID vaccinated and recent COVID infection on 07/01/20. Does not exhibit red flag symptoms such as SOB or chest pain. Throat exam does not exhibit exudates to warrant strep testing. Recommend  follow up with acute process is improved. She voiced understanding and agreement in plan.  Leg Cramps; Improved with discontinuation of lasix.  Recommend patient to discontinue potassium as well as this places her at risk for hyperkalemia.  Given acute illness, unable to obtain labs today to check potassium level given clinic rules. Patient recommended to hold meds at this time. Will reach out to cardiology to determine diuresis plan in setting of her HFrEF.  She was instructed to call clinic or cardiology office if develops SOB/LE edema. She voiced understanding and agreement with plan.   Mina Marble, DO PGY-3, [facility] 07/24/2020 9:32 AM

## 2020-07-21 NOTE — Patient Instructions (Signed)
I am so sorry we had to abbreviate this visit.  Below are the resources to be COVID tested:  [place]: [email or web address].[email or web address]/     Or  Call: [phone]  [facility] or [facility] - Schedule online or call your nearest drug store to schedule   Please reschedule your check up and evaluation for your numbness at your earliest convenience.  Given you have discontinued the Furosemide, I recommend you discontinue the potassium as well. Next time you come in we will get labs to check all of your blood levels.   Take care and I will be in touch Dr. Tarry Kos

## 2020-07-24 ENCOUNTER — Telehealth: Payer: Self-pay | Admitting: Physician Assistant

## 2020-07-24 NOTE — Telephone Encounter (Signed)
I called and spoke with patient, she is aware to weight herself daily and take a dose of Furosemide and Potassium if weight goes up more than 3 pounds in one day and she will call the office if that happens. Patient will follow a low sodium diet.

## 2020-07-24 NOTE — Telephone Encounter (Signed)
I received communication from her PCP.  The pt stopped Furosemide due to leg cramps. Please call patient. She should weigh once daily. If weight increases 3 lbs or more in 1 day or if she notices increased leg swelling and shortness of breath, she should take a dose of her Furosemide along with a dose of K+ and call us. She should only take K+ if she takes Furosemide. Low sodium diet. [name], PA-C    07/24/2020 1:13 PM

## 2020-07-24 NOTE — Telephone Encounter (Signed)
-----   Message from Danna Hefty, [place] sent at 07/24/2020  9:28 AM EDT ----- Regarding: Lasix Brigid Re,  I am the PCP of this patient.  I saw her in clinic on 9/21 and she endorsed discontinuation of her lasix due to severe leg cramps. She had continued taking her potassium as prescribed. She had no LE edema or SOB on my exam. I instructed her that if she is going to discontinue the lasix she should stop the potassium as well. I was wondering if you had any recommendations regarding her lasix given her HFrEF. I was thinking of making it a QOD dosing or PRN dosing for swelling. What are your thoughts?  At this time, the lasix is help per her request but she was instructed to call Cardiology or PCP if develops SOB or LE swelling to discuss.   I look forward to your thoughts.   Dr. Tarry Kos

## 2020-07-31 ENCOUNTER — Other Ambulatory Visit: Payer: Self-pay

## 2020-07-31 DIAGNOSIS — Z96641 Presence of right artificial hip joint: Secondary | ICD-10-CM

## 2020-07-31 DIAGNOSIS — G894 Chronic pain syndrome: Secondary | ICD-10-CM

## 2020-07-31 MED ORDER — HYDROCODONE-ACETAMINOPHEN 5-325 MG PO TABS: 1.0000 | ORAL_TABLET | Freq: Four times a day (QID) | ORAL | 0 refills | Status: DC | PRN

## 2020-08-17 ENCOUNTER — Ambulatory Visit: Payer: Medicare Other | Admitting: Family Medicine

## 2020-08-19 ENCOUNTER — Encounter: Payer: Self-pay | Admitting: Family Medicine

## 2020-08-19 ENCOUNTER — Other Ambulatory Visit: Payer: Self-pay

## 2020-08-19 ENCOUNTER — Ambulatory Visit (INDEPENDENT_AMBULATORY_CARE_PROVIDER_SITE_OTHER): Payer: Medicare Other | Admitting: Family Medicine

## 2020-08-19 VITALS — BP 132/66 | HR 62 | Wt 182.6 lb

## 2020-08-19 DIAGNOSIS — G629 Polyneuropathy, unspecified: Secondary | ICD-10-CM | POA: Diagnosis not present

## 2020-08-19 DIAGNOSIS — E559 Vitamin D deficiency, unspecified: Secondary | ICD-10-CM

## 2020-08-19 DIAGNOSIS — M4722 Other spondylosis with radiculopathy, cervical region: Secondary | ICD-10-CM | POA: Diagnosis not present

## 2020-08-19 DIAGNOSIS — E119 Type 2 diabetes mellitus without complications: Secondary | ICD-10-CM

## 2020-08-19 DIAGNOSIS — M79643 Pain in unspecified hand: Secondary | ICD-10-CM | POA: Diagnosis not present

## 2020-08-19 DIAGNOSIS — M79673 Pain in unspecified foot: Secondary | ICD-10-CM

## 2020-08-19 DIAGNOSIS — I5042 Chronic combined systolic (congestive) and diastolic (congestive) heart failure: Secondary | ICD-10-CM | POA: Diagnosis not present

## 2020-08-19 DIAGNOSIS — I110 Hypertensive heart disease with heart failure: Secondary | ICD-10-CM | POA: Diagnosis not present

## 2020-08-19 LAB — POCT GLYCOSYLATED HEMOGLOBIN (HGB A1C): Hemoglobin A1C: 6.1 % — AB (ref 4.0–5.6)

## 2020-08-19 MED ORDER — GABAPENTIN 100 MG PO CAPS: 200.0000 mg | ORAL_CAPSULE | Freq: Every day | ORAL | 0 refills | Status: DC

## 2020-08-19 NOTE — Assessment & Plan Note (Addendum)
Burning sensation in bilateral hands and feet likely multifactorial in nature. May be component of cervical radiculopathy as patient has muscle tightness and tenderness throughout her neck and trapezius region, and also has a hx of degenerative disease. There seems to be a neuropathy component as well, although less likely diabetic neuropathy as patient's A1C is 6.1 today. Patient does have a prior hx of vitamin D deficiency (most recent vit D level was 15 in May 2021) and chronic pain so these both may be contributing. I am reassured that there is no weakness or true sensation deficit on exam. -Will trial Gabapentin 200mg  nightly -Advised to restart vitamin D supplementation (patient has only been taking multivitamin gummies)  -Will obtain BMP today -Placed referral to PT for cervical radiculopathy -Placed podiatry referral as patient has a hx of diabetes, large callus noted on exam today, and patient has overall poor foot care

## 2020-08-19 NOTE — Progress Notes (Signed)
    SUBJECTIVE:   CHIEF COMPLAINT / HPI:   Burning in Hands and Feet Patient c/o burning and tingling in bilateral hands and feet x1 year. Hasn't mentioned it previously because she thought it was just "old age". Reports it has been worse over the last month. The pain comes and goes but is often worse at night. She feels like the tingling in her hands sometimes originates in her neck/shoulders. Patient notes that she has a hx of bilateral rotator cuff surgery.  PERTINENT  PMH / PSH: HTN, nonischemic cardiomyopathy, combined CHF, HLD, GERD, depression  OBJECTIVE:   BP 132/66   Pulse 62   Wt 182 lb 9.6 oz (82.8 kg)   SpO2 99%   BMI 27.76 kg/m   Gen: alert, well-appearing, NAD Heart: RRR, normal S1/S2 Lungs: normal WOB, lungs CTAB Neck: diffuse tenderness to palpation of neck, bilateral trapezius muscles, and upper back.  MSK: Normal ROM and 5/5 strength in bilateral upper extremities. Sensation intact throughout. Decreased ROM in Feet: normal sensation. 2+ distal pulses. Severely dry skin. 1cm corn/callus on L plantar surface at base of 5th metatarsal.   ASSESSMENT/PLAN:   Hand and foot pain Burning sensation in bilateral hands and feet likely multifactorial in nature. May be component of cervical radiculopathy as patient has muscle tightness and tenderness throughout her neck and trapezius region, and also has a hx of degenerative disease. There seems to be a neuropathy component as well, although less likely diabetic neuropathy as patient's A1C is 6.1 today. Patient does have a prior hx of vitamin D deficiency (most recent vit D level was 15 in May 2021) and chronic pain so these both may be contributing. I am reassured that there is no weakness or true sensation deficit on exam. -Will trial Gabapentin 200mg  nightly -Advised to restart vitamin D supplementation (patient has only been taking multivitamin gummies)  -Will obtain BMP today -Placed referral to PT for cervical  radiculopathy -Placed podiatry referral as patient has a hx of diabetes, large callus noted on exam today, and patient has overall poor foot care  T2DM Well-controlled. A1C 6.1% today. -Will obtain BMP -Podiatry referral as above   Alcus Dad, MD [place]

## 2020-08-19 NOTE — Patient Instructions (Addendum)
It was great to meet you!  Our plans for today:  - I have placed a referral to podiatry (the foot doctor). They should call you to schedule an appointment. It is very important that you see them! - We will trial a new medication called Gabapentin. You can take 2 pills at night to help with the burning sensation in your hands and feet. - I have also placed a referral for physical therapy to help with your neck/shoulder tightness and some of the burning in your hands. -Please restart your vitamin D. You may not be getting enough through the multivitamin gummies alone.  We are checking your kidney function and electrolytes today. I will call or send a letter with the results.  Take care and seek immediate care sooner if you develop any concerns.   Dr. [facility]

## 2020-08-20 LAB — BASIC METABOLIC PANEL
BUN/Creatinine Ratio: 13 (ref 12–28)
BUN: 13 mg/dL (ref 8–27)
CO2: 19 mmol/L — ABNORMAL LOW (ref 20–29)
Calcium: 9.3 mg/dL (ref 8.7–10.3)
Chloride: 99 mmol/L (ref 96–106)
Creatinine, Ser: 1.04 mg/dL — ABNORMAL HIGH (ref 0.57–1.00)
GFR calc Af Amer: 61 mL/min/{1.73_m2} (ref >59)
GFR calc non Af Amer: 53 mL/min/{1.73_m2} — ABNORMAL LOW (ref >59)
Glucose: 108 mg/dL — ABNORMAL HIGH (ref 65–99)
Potassium: 4.4 mmol/L (ref 3.5–5.2)
Sodium: 139 mmol/L (ref 134–144)

## 2020-08-26 ENCOUNTER — Encounter: Payer: Self-pay | Admitting: Family Medicine

## 2020-09-08 ENCOUNTER — Other Ambulatory Visit: Payer: Self-pay

## 2020-09-08 DIAGNOSIS — G894 Chronic pain syndrome: Secondary | ICD-10-CM

## 2020-09-08 DIAGNOSIS — Z96641 Presence of right artificial hip joint: Secondary | ICD-10-CM

## 2020-09-08 MED ORDER — HYDROCODONE-ACETAMINOPHEN 5-325 MG PO TABS: 1.0000 | ORAL_TABLET | Freq: Four times a day (QID) | ORAL | 0 refills | Status: DC | PRN

## 2020-09-08 NOTE — Telephone Encounter (Signed)
Received phone call from [place], case manager with [employer] (Home BP monitoring service through insurance) Reports that blood pressures have been elevated from baseline. Bps: 443X-540G systolic, 86-76P diastolic, headaches over the last month, vision "feels off". Weight has not changed. HR 50's-60's.   More depressed since having COVID in August. Patient is agreeable to virtual counseling program through [facility].   Scheduled patient follow up appointment with PCP on Wednesday 11/17. (First available date patient can come into office).   Strict ED precautions given.   Patient is also requesting refill on pain medication. Pended medication to this encounter.   Talbot Grumbling, RN

## 2020-09-09 ENCOUNTER — Other Ambulatory Visit: Payer: Self-pay | Admitting: Family Medicine

## 2020-09-09 DIAGNOSIS — Z96641 Presence of right artificial hip joint: Secondary | ICD-10-CM

## 2020-09-09 DIAGNOSIS — G894 Chronic pain syndrome: Secondary | ICD-10-CM

## 2020-09-15 ENCOUNTER — Other Ambulatory Visit: Payer: Self-pay

## 2020-09-15 ENCOUNTER — Ambulatory Visit: Payer: Medicare Other | Admitting: Podiatry

## 2020-09-15 DIAGNOSIS — B351 Tinea unguium: Secondary | ICD-10-CM

## 2020-09-15 DIAGNOSIS — M79674 Pain in right toe(s): Secondary | ICD-10-CM

## 2020-09-15 DIAGNOSIS — R52 Pain, unspecified: Secondary | ICD-10-CM

## 2020-09-15 DIAGNOSIS — M79675 Pain in left toe(s): Secondary | ICD-10-CM | POA: Diagnosis not present

## 2020-09-15 DIAGNOSIS — L84 Corns and callosities: Secondary | ICD-10-CM | POA: Diagnosis not present

## 2020-09-15 NOTE — Progress Notes (Signed)
Subjective:   Patient ID: Donna Herrera    DOB: 1945-12-04, 74 y.o. female   MRN: [id number]  Donna Herrera is a 74 y.o. female with a history of CHF, HTN, NICM, chronic lung disease, GERD, T2DM, DJD, chronic pain, gout, h/o tobacco abuse, HLD, Vitamin D deficiency here for BP follow up.  Current Concerns: Left Breast Pain: Patient notes that she has had left lateral breast pain x months. Denies any nipple discharge, lumps, or skin changes. Denies any personal or family history of breast cancer. She is unsure if it is from her crutches or not.  Headaches:  She endorses occasional yet intense sudden headaches that occur in her frontal scalp, sometimes in the back. She notes they only last a few minutes then go away. Sometimes she takes ASA or Tylenol which help as well. Denies any lacrimation, rhinorrhea, sensitivity to light or sound, vision changes, congestion, fever/chills, sinus pain, or excessive caffeine. She uses Tylenol occasionally but does use Hydrocodone-Acetamenophan daily for chronic right hip pain. Also denies any slurred speech, facial droop, or focal weakness. She does endorse neck muscle pain.   HTN  HFrEF BP: (!) 142/80 today. Currently on Hydralazine 10mg  TID, Spironolactone 12.5mg  QD, Coreg 3.125mg  BID, Furosemide/K-dur PRN for swelling. Follows with cardiology closely. Endorses compliance. Former smoker. Denies any chest pain, SOB, vision changes, or headaches. Denies LE swelling, cramps, SOB, >3lb weight gain.   HLD: Last lipid panel below. Not currently on any lipid lowering medications.  The 10-year ASCVD risk score Mikey Bussing DC Jr., et al., 2013) is: 46.9%   Lab Results  Component Value Date   CHOL 259 (H) 09/16/2020   HDL 75 09/16/2020   LDLCALC 165 (H) 09/16/2020   TRIG 108 09/16/2020   CHOLHDL 3.5 09/16/2020   Review of Systems:  Per HPI.   Objective:   BP (!) 142/80   Pulse 69   Ht 5\' 8"  (1.727 m)   Wt 188 lb (85.3 kg)   SpO2 97%    BMI 28.59 kg/m  Vitals and nursing note reviewed.  General: pleasant elderly female, sitting comfortably on exam bed, well nourished, well developed, in no acute distress with non-toxic appearance HEENT: No sinus pain with palpation to frontal or maxillary, head atraumatic normocephalic  CV: regular rate and rhythm without murmurs, rubs, or gallops, no lower extremity edema Lungs: clear to auscultation bilaterally with normal work of breathing on room air Resp: speaking in full sentences Skin: warm, dry MSK: Pain to palpation along trapezius extending up to occipital muscle bilaterally, no midline bony tenderness, normal ROM of neck, exaggerated lordosis/kypohosis of neck appreciated. Significant Left sided rib pain to palpation.  Walks with crutches chronically  Breast Exam: no abnormal nodules appreciated, breast tissue nontender to palpation (Chaperone Present: [place])  Neuro: Alert and oriented, speech normal  Assessment & Plan:   History of tobacco abuse History of smoking 1PPD x 52 years.Quit in 2017 Pack years: 59  Qualifies for lung cancer screen. Does not appear to have had screening prior. Discussed with patient. She will think it over and follow up if desires screening.  Tension headache Appears most consistent with muscle tension headache. Multiple positive trigger points occipital groove, trapezius and paraspinal points. Suspect this may be in part due to long term crutches use leading to asymmetric muscle activation and changes in spinal curvature. Does not quite fit migraine headache or cluster headache. No red flag symptoms. Given age, medical treatment options are limited. Will trial Meloxicam 7.5mg  QD x  10 days, continue home Hydrocodone/Acetamenophan (caution use of excessive Tylenol when taking this), and emphasized use of topical analgesics such as icey-hot/Voltaren gel, and heating pad/ice when able. She was cautioned not to use other NSAID based medications while  taking Meloxicam. Follow up in 2-4 weeks if no improvement. Can consider referral to [facility] Medicine at [facility] for [facility]MM (Dr. Hulan Saas, DO) as I believe this may also be beneficial. Discuss red flag symptoms that would warrant returning sooner or trip to ED. Patient understood and agreed to plan  Hand and foot pain This was not addressed today, however will add on B12 level to add on to evaluation.  Rib pain on left side Breast exam notable for significant left sided rib pain to palpation. No pathology in breast identified. Prior mammograms reviewed and all negative. Differential for this rib pain includes chronic use of crutches vs intercostal strain vs costochondritis vs rib fracture from recent cough in setting of COVID-19/bronchitis infection. Do not feel imaging would change management at this time. Provided reassurance and recommended conservative treatment at this time. Meloxicam 7.5mg  QD x 10 days (cautioned use of other NSAIDs while taking this medication, continue home Hydrocodone-Acetamenaphen as prescribed, topical analgesics (Voltarn gel, Icy-hot, Salonpas patches), and heating pad PRN. RTC in 2-4 weeks if no improvement, sooner if worsening. - regularly scheduled screening mammogram, if negative may be able to discontinue after this year. Will discuss with patient  Hyperlipidemia Chronic. Not on lipid lowering medications.  Lipid panel today with elevated LDL The 10-year ASCVD risk score Mikey Bussing DC Jr., et al., 2013) is: 46.9% Will start Atorvastatin 40mg  QD  DJD (degenerative joint disease) Patient to start PT. She has transportation difficulties and requests home PT. Home health PT order placed.   Essential hypertension Chronic, only slightly above goal.  Continue Hydralazine 10mg  TID, Spironolactone 12.5mg  QD, Coreg 3.125mg  BID, and PRN Furosemide/Kdur for swelling. Follow up with cardiology as scheduled (10/09/20)  Health maintenance: Flu and COVID vaccine given  today  Orders Placed This Encounter  Procedures  . MM Digital Screening    Ins: [employer] AARP PF:09/24/2019 BCG No breast problems No Breast Implants No breast Reduction No hx of breast cancer Pt uses crutches No to all Covid questions Covid vaccine:09/16/2020 booster drp    Standing Status:   Future    Standing Expiration Date:   09/16/2021    Order Specific Question:   Reason for Exam (SYMPTOM  OR DIAGNOSIS REQUIRED)    Answer:   screening mammogram    Order Specific Question:   Preferred imaging location?    Answer:   [facility]  . Flu Vaccine QUAD High Dose(Fluad)  . Information systems manager  . Lipid Panel  . Vitamin B12  . Basic Metabolic Panel  . Vitamin D, 25-hydroxy  . Home Health    Order Specific Question:   To provide the following care/treatments    Answer:   PT  . Face-to-face encounter (required for Medicare/Medicaid patients)    I Danna Hefty certify that this patient is under my care and that I, or a nurse practitioner or physician's assistant working with me, had a face-to-face encounter that meets the physician face-to-face encounter requirements with this patient on 09/16/2020. The encounter with the patient was in whole, or in part for the following medical condition(s) which is the primary reason for home health care (List medical condition): chronic ambulation with crutches, chronic back pain, unsafe ambulation    Order Specific Question:   The  encounter with the patient was in whole, or in part, for the following medical condition, which is the primary reason for home health care    Answer:   difficulty with ambulation, unsafe ambualtion, chronic ambulation with crutches    Order Specific Question:   I certify that, based on my findings, the following services are medically necessary home health services    Answer:   Physical therapy    Order Specific Question:   Reason for Medically Necessary Home Health Services    Answer:   Therapy- Personnel officer,  Adult nurse Specific Question:   My clinical findings support the need for the above services    Answer:   Pain interferes with ambulation/mobility    Order Specific Question:   Further, I certify that my clinical findings support that this patient is homebound due to:    Answer:   Unsafe ambulation due to balance issues   Meds ordered this encounter  Medications  . meloxicam (MOBIC) 7.5 MG tablet    Sig: Take 1 tablet (7.5 mg total) by mouth daily for 10 days.    Dispense:  10 tablet    Refill:  0  . atorvastatin (LIPITOR) 40 MG tablet    Sig: Take 1 tablet (40 mg total) by mouth daily.    Dispense:  90 tablet    Refill:  3    Mina Marble, DO PGY-3, [facility] 09/18/2020 11:04 AM

## 2020-09-16 ENCOUNTER — Other Ambulatory Visit: Payer: Self-pay

## 2020-09-16 ENCOUNTER — Encounter: Payer: Self-pay | Admitting: Family Medicine

## 2020-09-16 ENCOUNTER — Encounter: Payer: Self-pay | Admitting: Podiatry

## 2020-09-16 ENCOUNTER — Ambulatory Visit (INDEPENDENT_AMBULATORY_CARE_PROVIDER_SITE_OTHER): Payer: Medicare Other | Admitting: Family Medicine

## 2020-09-16 VITALS — BP 142/80 | HR 69 | Ht 68.0 in | Wt 188.0 lb

## 2020-09-16 DIAGNOSIS — E119 Type 2 diabetes mellitus without complications: Secondary | ICD-10-CM

## 2020-09-16 DIAGNOSIS — M79673 Pain in unspecified foot: Secondary | ICD-10-CM

## 2020-09-16 DIAGNOSIS — I10 Essential (primary) hypertension: Secondary | ICD-10-CM

## 2020-09-16 DIAGNOSIS — Z87891 Personal history of nicotine dependence: Secondary | ICD-10-CM

## 2020-09-16 DIAGNOSIS — E559 Vitamin D deficiency, unspecified: Secondary | ICD-10-CM

## 2020-09-16 DIAGNOSIS — M1611 Unilateral primary osteoarthritis, right hip: Secondary | ICD-10-CM

## 2020-09-16 DIAGNOSIS — G44209 Tension-type headache, unspecified, not intractable: Secondary | ICD-10-CM

## 2020-09-16 DIAGNOSIS — E785 Hyperlipidemia, unspecified: Secondary | ICD-10-CM

## 2020-09-16 DIAGNOSIS — M79643 Pain in unspecified hand: Secondary | ICD-10-CM | POA: Diagnosis not present

## 2020-09-16 DIAGNOSIS — R0781 Pleurodynia: Secondary | ICD-10-CM | POA: Diagnosis not present

## 2020-09-16 DIAGNOSIS — R0789 Other chest pain: Secondary | ICD-10-CM

## 2020-09-16 DIAGNOSIS — Z1231 Encounter for screening mammogram for malignant neoplasm of breast: Secondary | ICD-10-CM

## 2020-09-16 DIAGNOSIS — G8929 Other chronic pain: Secondary | ICD-10-CM

## 2020-09-16 DIAGNOSIS — Z23 Encounter for immunization: Secondary | ICD-10-CM

## 2020-09-16 DIAGNOSIS — G5790 Unspecified mononeuropathy of unspecified lower limb: Secondary | ICD-10-CM | POA: Diagnosis not present

## 2020-09-16 DIAGNOSIS — R7303 Prediabetes: Secondary | ICD-10-CM

## 2020-09-16 MED ORDER — MELOXICAM 7.5 MG PO TABS: 7.5000 mg | ORAL_TABLET | Freq: Every day | ORAL | 0 refills | Status: AC

## 2020-09-16 NOTE — Progress Notes (Signed)
  Subjective:  Patient ID: Donna Herrera, female    DOB: [birth year],  MRN: [id number]  Chief Complaint  Patient presents with  . routine foot care    nail trim. Left foot painful callouses on the bottom of foot     74 y.o. female presents with the above complaint. History confirmed with patient.   Objective:  Physical Exam: warm, good capillary refill, no trophic changes or ulcerative lesions, normal DP and PT pulses, normal monofilament exam and normal sensory exam. Onychomycosis x10. Submet 5 callus left foot  Assessment:  No diagnosis found.   Plan:  Patient was evaluated and treated and all questions answered.   Discussed the etiology and treatment options for the condition in detail with the patient. Educated patient on the topical and oral treatment options for mycotic nails. Recommended debridement of the nails today. Sharp and mechanical debridement performed of all painful and mycotic nails today. Nails debrided in length and thickness using a nail nipper and a mechanical burr to level of comfort. Discussed treatment options including appropriate shoe gear. Follow up as needed for painful nails.   All symptomatic hyperkeratoses were safely debrided with a sterile #15 blade to patient's level of comfort without incident. We discussed preventative and palliative care of these lesions including supportive and accommodative shoegear, padding, prefabricated and custom molded accommodative orthoses, use of a pumice stone and lotions/creams daily.   No follow-ups on file.

## 2020-09-16 NOTE — Assessment & Plan Note (Addendum)
History of smoking 1PPD x 52 years.Quit in 2017 Pack years: 32  Qualifies for lung cancer screen. Does not appear to have had screening prior. Discussed with patient. She will think it over and follow up if desires screening.

## 2020-09-16 NOTE — Patient Instructions (Signed)
It was a pleasure to see you today!  Thank you for choosing [facility] for your primary care.  Donna Herrera was seen for follow up.   Our plans for today were:  I think your breast pain is actually rib pain from your coughing and crutches.   Voltaren gel and Icey-Hot, Salonpas patches 3-4 times a day   Giving short course of of Meloxicam 7.5mg : take one a day  Heating pad  I feel your headaches are tension headaches.   Use Meloxicam and your Hydrocodone/Acetamenophen for pain relief.   Avoid other NSAIDs while taking the Meloxicam.   Use Voltaren gel, Icey-Hot to your neck muscles.   Heating pad  To keep you healthy, please keep in mind the following health maintenance items that you are due for:   1. Lung cancer screening - need low dose CT scan. Let me know if you would like me to order 2.  Mammogram has been ordered   You should return to our clinic in 3 months for follow up.   Best Wishes,   Mina Marble, DO

## 2020-09-16 NOTE — Progress Notes (Signed)
   Covid-19 Vaccination Clinic  Name:  Donna Herrera    MRN: [id number] DOB: Apr 05, 1946  09/16/2020  Donna Herrera was observed post Covid-19 immunization for 15 minutes without incident. She was provided with Vaccine Information Sheet and instruction to access the V-Safe system.   Donna Herrera was instructed to call 911 with any severe reactions post vaccine: [name] Difficulty breathing  . Swelling of face and throat  . A fast heartbeat  . A bad rash all over body  . Dizziness and weakness

## 2020-09-17 DIAGNOSIS — G44209 Tension-type headache, unspecified, not intractable: Secondary | ICD-10-CM | POA: Insufficient documentation

## 2020-09-17 DIAGNOSIS — R0781 Pleurodynia: Secondary | ICD-10-CM

## 2020-09-17 HISTORY — DX: Pleurodynia: R07.81

## 2020-09-17 LAB — BASIC METABOLIC PANEL
BUN/Creatinine Ratio: 12 (ref 12–28)
BUN: 10 mg/dL (ref 8–27)
CO2: 23 mmol/L (ref 20–29)
Calcium: 9.9 mg/dL (ref 8.7–10.3)
Chloride: 101 mmol/L (ref 96–106)
Creatinine, Ser: 0.81 mg/dL (ref 0.57–1.00)
GFR calc Af Amer: 83 mL/min/{1.73_m2} (ref >59)
GFR calc non Af Amer: 72 mL/min/{1.73_m2} (ref >59)
Glucose: 88 mg/dL (ref 65–99)
Potassium: 4.7 mmol/L (ref 3.5–5.2)
Sodium: 142 mmol/L (ref 134–144)

## 2020-09-17 LAB — LIPID PANEL
Chol/HDL Ratio: 3.5 ratio (ref 0.0–4.4)
Cholesterol, Total: 259 mg/dL — ABNORMAL HIGH (ref 100–199)
HDL: 75 mg/dL (ref >39)
LDL Chol Calc (NIH): 165 mg/dL — ABNORMAL HIGH (ref 0–99)
Triglycerides: 108 mg/dL (ref 0–149)
VLDL Cholesterol Cal: 19 mg/dL (ref 5–40)

## 2020-09-17 LAB — VITAMIN D 25 HYDROXY (VIT D DEFICIENCY, FRACTURES): Vit D, 25-Hydroxy: 15.4 ng/mL — ABNORMAL LOW (ref 30.0–100.0)

## 2020-09-17 LAB — VITAMIN B12: Vitamin B-12: 439 pg/mL (ref 232–1245)

## 2020-09-17 MED ORDER — ATORVASTATIN CALCIUM 40 MG PO TABS: 40.0000 mg | ORAL_TABLET | Freq: Every day | ORAL | 3 refills | Status: DC

## 2020-09-17 NOTE — Assessment & Plan Note (Signed)
Patient to start PT. She has transportation difficulties and requests home PT. Home health PT order placed.

## 2020-09-17 NOTE — Assessment & Plan Note (Addendum)
Appears most consistent with muscle tension headache. Multiple positive trigger points occipital groove, trapezius and paraspinal points. Suspect this may be in part due to long term crutches use leading to asymmetric muscle activation and changes in spinal curvature. Does not quite fit migraine headache or cluster headache. No red flag symptoms. Given age, medical treatment options are limited. Will trial Meloxicam 7.5mg  QD x 10 days, continue home Hydrocodone/Acetamenophan (caution use of excessive Tylenol when taking this), and emphasized use of topical analgesics such as icey-hot/Voltaren gel, and heating pad/ice when able. She was cautioned not to use other NSAID based medications while taking Meloxicam. Follow up in 2-4 weeks if no improvement. Can consider referral to [facility] Medicine at [facility] for [facility]MM (Dr. Hulan Saas, DO) as I believe this may also be beneficial. Discuss red flag symptoms that would warrant returning sooner or trip to ED. Patient understood and agreed to plan

## 2020-09-17 NOTE — Assessment & Plan Note (Signed)
Chronic, only slightly above goal.  Continue Hydralazine 10mg  TID, Spironolactone 12.5mg  QD, Coreg 3.125mg  BID, and PRN Furosemide/Kdur for swelling. Follow up with cardiology as scheduled (10/09/20)

## 2020-09-17 NOTE — Assessment & Plan Note (Addendum)
Chronic. Not on lipid lowering medications.  Lipid panel today with elevated LDL The 10-year ASCVD risk score Mikey Bussing DC Jr., et al., 2013) is: 46.9% Will start Atorvastatin 40mg  QD

## 2020-09-17 NOTE — Assessment & Plan Note (Addendum)
Breast exam notable for significant left sided rib pain to palpation. No pathology in breast identified. Prior mammograms reviewed and all negative. Differential for this rib pain includes chronic use of crutches vs intercostal strain vs costochondritis vs rib fracture from recent cough in setting of COVID-19/bronchitis infection. Do not feel imaging would change management at this time. Provided reassurance and recommended conservative treatment at this time. Meloxicam 7.5mg  QD x 10 days (cautioned use of other NSAIDs while taking this medication, continue home Hydrocodone-Acetamenaphen as prescribed, topical analgesics (Voltarn gel, Icy-hot, Salonpas patches), and heating pad PRN. RTC in 2-4 weeks if no improvement, sooner if worsening. - regularly scheduled screening mammogram, if negative may be able to discontinue after this year. Will discuss with patient

## 2020-09-17 NOTE — Assessment & Plan Note (Signed)
This was not addressed today, however will add on B12 level to add on to evaluation.

## 2020-09-18 ENCOUNTER — Ambulatory Visit: Payer: Medicare Other

## 2020-09-21 ENCOUNTER — Telehealth: Payer: Self-pay

## 2020-09-21 NOTE — Telephone Encounter (Signed)
Patient calls nurse line reporting undesired side effects to Meloxicam. Patient reports she started taking as prescribed on 11/18. Since then she has had increased swelling in her legs and feet and has not had "normal" urine output. Patient reports she stopped taking on 11/20 and can not tell if the swelling has improved. Patient is due to take lasix today, advised patient to go ahead and take one. Apt offered due to symptoms, however patient declined. Patient encourage to call back if lasix does not improve swelling and urine output. Please advise on Meloxicam.

## 2020-09-21 NOTE — Telephone Encounter (Signed)
Call patient.  Concern for worsening of kidney function in the setting of starting meloxicam and now possible increased fluid.  Patient reports that she took her Lasix this morning.  She states that she has been peeing well today and having improvement now.  Advised her to discontinue use of meloxicam for now.  She was offered an appointment, however she declined that she has difficulty getting here, states that she would also have difficulty getting into a cab even if she was provided a voucher.  She denies any chest pain or trouble breathing.  Advised her to call as soon as possible if she has worsening of her symptoms or does not improve.  Advised her to go to the emergency room immediately if we are not open and she is having difficulty breathing.  She voices understanding.

## 2020-09-22 ENCOUNTER — Other Ambulatory Visit: Payer: Self-pay | Admitting: Family Medicine

## 2020-09-22 ENCOUNTER — Other Ambulatory Visit: Payer: Self-pay | Admitting: Physician Assistant

## 2020-09-22 DIAGNOSIS — G629 Polyneuropathy, unspecified: Secondary | ICD-10-CM

## 2020-09-22 DIAGNOSIS — I428 Other cardiomyopathies: Secondary | ICD-10-CM

## 2020-09-22 DIAGNOSIS — U071 COVID-19: Secondary | ICD-10-CM

## 2020-09-22 DIAGNOSIS — G44209 Tension-type headache, unspecified, not intractable: Secondary | ICD-10-CM

## 2020-09-22 DIAGNOSIS — I5042 Chronic combined systolic (congestive) and diastolic (congestive) heart failure: Secondary | ICD-10-CM

## 2020-09-22 DIAGNOSIS — R0781 Pleurodynia: Secondary | ICD-10-CM

## 2020-09-22 DIAGNOSIS — G894 Chronic pain syndrome: Secondary | ICD-10-CM

## 2020-09-22 NOTE — Telephone Encounter (Signed)
Per chart review, patient should be on coreg 3.125 mg, therefore script changed to this.  It also looks like Dr. Tarry Kos declined last baclofen, therefore will also decline.

## 2020-09-29 ENCOUNTER — Other Ambulatory Visit: Payer: Self-pay | Admitting: Physician Assistant

## 2020-10-08 NOTE — Progress Notes (Signed)
Cardiology Office Note   Date:  10/09/2020   ID:  Donna Herrera, DOB [birth year], MRN [id number]  PCP:  Danna Hefty, DO  Cardiologist:   Dorris Carnes, MD   F/U of CHF     History of Present Illness: Donna Herrera is a 74 y.o. female with a history of systolic CHF   LVEF in 15 to 20% (Cath in 2017 normal)   Repeat in 2019 LVEF 40 to 37  Pt also has a hx of HTN, HL and tob use  I saw the pt back in 2020  She was seen by Kathleen Argue twice earlier this year    The pt says her breathing is OK   She denies  dizziness  No CP   She tried meloxicam for her knees and ended up retaining fluids  Had to go on lasix   Improved now  BP at home high   140s to 170s   She was on Entresto before but it stopped because it lowered her BP too much   On hydralazine now   Current Meds  Medication Sig  . albuterol (PROVENTIL HFA;VENTOLIN HFA) 108 (90 Base) MCG/ACT inhaler Inhale 1-2 puffs into the lungs every 6 (six) hours as needed for wheezing.  [name] aspirin 81 MG tablet Take 1 tablet (81 mg total) by mouth daily.  [name] atorvastatin (LIPITOR) 40 MG tablet Take 1 tablet (40 mg total) by mouth daily.  . Blood Glucose Monitoring Suppl (ONETOUCH VERIO) w/Device KIT Please use to check blood sugar once daily. E11.9  . carvedilol (COREG) 3.125 MG tablet TAKE 1 TABLET (3.125 MG TOTAL) BY MOUTH 2 (TWO) TIMES DAILY.  . furosemide (LASIX) 20 MG tablet TAKE 1 TABLET BY MOUTH EVERY DAY  . furosemide (LASIX) 20 MG tablet TAKE 20 MG TABLET BY MOUTH ON Monday, WED, FRI  . gabapentin (NEURONTIN) 100 MG capsule TAKE 2 CAPSULES BY MOUTH AT BEDTIME.  [name] glucose blood (ONETOUCH VERIO) test strip Please use to check blood sugar once daily. E11.9  . hydrALAZINE (APRESOLINE) 10 MG tablet TAKE 1 TABLET BY MOUTH THREE TIMES A DAY  . HYDROcodone-acetaminophen (NORCO/VICODIN) 5-325 MG tablet Take 1 tablet by mouth every 6 (six) hours as needed for moderate pain.  Elmore Guise Devices (ONE TOUCH DELICA LANCING DEV)  MISC Please use to check blood sugar once daily. E11.9  . OneTouch Delica Lancets 43P MISC Please use to check blood sugar once daily. E11.9  . potassium chloride SA (KLOR-CON) 20 MEQ tablet Take 1 tablet by mouth on Monday/Wednesday/Friday. Take an additional dose with Furosemide if weight goes up 3 pounds or more in 1 day.  . spironolactone (ALDACTONE) 25 MG tablet Take 0.5 tablets (12.5 mg total) by mouth daily.     Allergies:   Crab [shellfish allergy] and Morphine and related   Past Medical History:  Diagnosis Date  . Allergy   . Arthritis   . Candidiasis, mouth 10/19/2017  . Chronic combined systolic and diastolic CHF (congestive heart failure) ([place]) 10/21/2015   Echo 3/16: EF 40%, diffuse HK, mild MR, moderate LAE, mild RAE, PASP 42 mmHg  //  Echo 12/16: Mild LVH, EF 35-40%, diffuse HK, Gr 2 DD, MAC, mild MR, mod LAE, normal RVSF, PASP 49 mmHg // Echo 5/19: EF 15-20, diff HK, Gr 2 DD, trivial AI, MAC, trivial MR, mod LAE, normal RVSF, mild TR, PASP 62 // Limited Echo 8/19: mod LVH, EF 40-45, diff HK, Gr 1 DD, MAC, mild LAE   .  Chronic left shoulder pain 04/10/2013  . Chronic lung disease   . Chronic pain   . Chronic pain syndrome 07/20/2017  . Depression 08/25/2016  . DJD (degenerative joint disease) 10/12/2012   Multiple joint replacements by Dr. Collier Salina.   . Dyshidrotic eczema 12/31/2014  . Essential hypertension 07/20/2017  . GERD (gastroesophageal reflux disease)   . Gout   . H/O slipped capital femoral epiphysis (SCFE)   . History of total right hip replacement 04/02/2019   Followed closely with orthopedist Dr. Alvan Dame. Patient is s/p total right hip replacement in 1980's with evidence of bone loss and osteopenia making reconstruction unlikely. Plan for conservative treatment at this time with crutches for ambulation and pain control.  . Hyperlipidemia   . Hypertension   . Hypertensive heart disease with CHF (congestive heart failure) ([place]) 10/12/2012  . Loss of taste 02/06/2018   . Mass of breast, left 07/21/2014  . NICM (nonischemic cardiomyopathy) ([place]) 06/09/2016   A. LHC 7/17: Normal coronary arteries, EF 30-35%, LVEDP 14 mmHg  . Pain in gums 04/18/2018  . Pre-diabetes   . Right hip pain 11/02/2012  . Tendonitis, Achilles, left 05/24/2017  . Tobacco abuse 09/29/2014  . Vitamin D deficiency 12/31/2014    Past Surgical History:  Procedure Laterality Date  . CARDIAC CATHETERIZATION  2005   normal-Hochrein  . CARDIAC CATHETERIZATION N/A 05/11/2016   Procedure: Right/Left Heart Cath and Coronary Angiography;  Surgeon: Peter M Martinique, MD;  Location: [facility];  Service: Cardiovascular;  Laterality: N/A;  . CESAREAN SECTION    . COLONOSCOPY  1990's ??  . HIP FUSION Left    left   . JOINT REPLACEMENT Bilateral 1991, 1997   [place]  . ROTATOR CUFF REPAIR     bilateral  . TOOTH EXTRACTION N/A 04/03/2020   Procedure: DENTAL RESTORATION/EXTRACTIONS;  Surgeon: [name], DDS;  Location: [facility];  Service: Oral Surgery;  Laterality: N/A;  . [place]   right. Dr. Collier Salina  . TUBAL LIGATION       Social History:  The patient  reports that she quit smoking about 4 years ago. She has a 27.50 pack-year smoking history. She has never used smokeless tobacco. She reports previous alcohol use. She reports that she does not use drugs.   Family History:  The patient's family history includes Cancer in her father; Depression in her mother; Diabetes in her mother and sister; Heart disease in her mother; Hypertension in her mother; Stomach cancer in her brother; Stroke in her mother; Throat cancer in her son.    ROS:  Please see the history of present illness. All other systems are reviewed and  Negative to the above problem except as noted.    PHYSICAL EXAM: VS:  BP 140/80   Pulse 68   Ht 5' 8" (1.727 m)   Wt 185 lb (83.9 kg)   SpO2 99%   BMI 28.13 kg/m   GEN: OVerweight 74 yo in no acute distress  HEENT: normal  Neck: no JVD   Cardiac:  RRR; no murmurs    Triv LE  edema  Respiratory:  clear to auscultation bilaterally GI: soft, nontender, nondistended, + BS  No hepatomegaly  MS: no deformity Moving all extremities   Skin: warm and dry, no rash Neuro: Deferred   EKG:  EKG is not ordered today.  Prior CV Studies: Echo 06/06/18 Mod LVH, EF 40-45, diff HK, Gr 1 DD, MAC, trivial MR, mild LAE, trivial TR  Echo  03/06/2018 EF 15-20, diffuse HK, grade 2 diastolic dysfunction, trivial AI, MAC, trivial MR, moderate LAE, normal RVSF, mild TR, PASP 62  LHC 05/11/16 1. Normal coronary anatomy 2. Severe LV dysfunction with EF 30-35%. 3. Normal right heart and LV filling pressures. Lipid Panel    Component Value Date/Time   CHOL 259 (H) 09/16/2020 1335   TRIG 108 09/16/2020 1335   HDL 75 09/16/2020 1335   CHOLHDL 3.5 09/16/2020 1335   CHOLHDL 3.5 10/04/2015 0141   VLDL 16 10/04/2015 0141   LDLCALC 165 (H) 09/16/2020 1335      Wt Readings from Last 3 Encounters:  10/09/20 185 lb (83.9 kg)  09/16/20 188 lb (85.3 kg)  08/19/20 182 lb 9.6 oz (82.8 kg)      ASSESSMENT AND PLAN:  1 Hx nonischemic cardiomyopathy  Volume status is OK   She is on lasix 3x per wk   Avoid meloxicam   2  HTN  BP is up   WIll increase hydralazine to 20 tid  F/U in HTN clinic in 4 t o6 wks   3  HL   On lipitor now   Will recheck lipids when returns  Last LDL 165    Current medicines are reviewed at length with the patient today.  The patient does not have concerns regarding medicines.  Signed, Dorris Carnes, MD  10/09/2020 1:45 PM    [facility] [place], [place], [place]  [address] Phone: [phone]; Fax: [phone]

## 2020-10-09 ENCOUNTER — Ambulatory Visit: Payer: Medicare Other | Admitting: Internal Medicine

## 2020-10-09 ENCOUNTER — Other Ambulatory Visit: Payer: Self-pay

## 2020-10-09 ENCOUNTER — Encounter: Payer: Self-pay | Admitting: Internal Medicine

## 2020-10-09 VITALS — BP 140/80 | HR 68 | Ht 68.0 in | Wt 185.0 lb

## 2020-10-09 DIAGNOSIS — I5042 Chronic combined systolic (congestive) and diastolic (congestive) heart failure: Secondary | ICD-10-CM

## 2020-10-09 DIAGNOSIS — M542 Cervicalgia: Secondary | ICD-10-CM | POA: Diagnosis not present

## 2020-10-09 MED ORDER — HYDRALAZINE HCL 10 MG PO TABS
20.0000 mg | ORAL_TABLET | Freq: Three times a day (TID) | ORAL | 6 refills | Status: DC
Start: 2020-10-09 — End: 2021-02-12

## 2020-10-09 NOTE — Patient Instructions (Addendum)
Medication Instructions:  Your physician has recommended you make the following change in your medication:  1.) increase hydralazine 10 mg to two tablets (20 mg) three times daily  8 am, 2 pm, 8 pm   *If you need a refill on your cardiac medications before your next appointment, please call your pharmacy*   Lab Work: none If you have labs (blood work) drawn today and your tests are completely normal, you will receive your results only by: [name] MyChart Message (if you have MyChart) OR . A paper copy in the mail If you have any lab test that is abnormal or we need to change your treatment, we will call you to review the results.   Testing/Procedures: none   Follow-Up: Hypertension Clinic for blood pressure management in about 6 weeks   Other Instructions You have been referred to Dr. Gardenia Phlegm, Sports Medicine.

## 2020-10-12 ENCOUNTER — Other Ambulatory Visit: Payer: Self-pay

## 2020-10-12 ENCOUNTER — Other Ambulatory Visit: Payer: Self-pay | Admitting: Family Medicine

## 2020-10-12 DIAGNOSIS — G894 Chronic pain syndrome: Secondary | ICD-10-CM

## 2020-10-12 DIAGNOSIS — Z96641 Presence of right artificial hip joint: Secondary | ICD-10-CM

## 2020-10-13 MED ORDER — HYDROCODONE-ACETAMINOPHEN 5-325 MG PO TABS: 1.0000 | ORAL_TABLET | Freq: Four times a day (QID) | ORAL | 0 refills | Status: DC | PRN

## 2020-11-03 ENCOUNTER — Ambulatory Visit: Payer: Medicare Other

## 2020-11-11 ENCOUNTER — Ambulatory Visit: Payer: Medicare Other | Admitting: Family Medicine

## 2020-11-11 NOTE — Progress Notes (Deleted)
[place] Phone: [phone] Subjective:    I'm seeing this patient by the request  of:  Danna Hefty, DO  CC:   [id number]  Sharisa Toves is a 75 y.o. female coming in with complaint of neck pain.  Cervical XR 2018 Multilevel spondylosis with severe narrowing between C3-4, C4-5, C5-6,  C6-7 and anterior spurring   Impression: Severe disc disease of C-spine       Past Medical History:  Diagnosis Date  . Allergy   . Arthritis   . Candidiasis, mouth 10/19/2017  . Chronic combined systolic and diastolic CHF (congestive heart failure) ([place]) 10/21/2015   Echo 3/16: EF 40%, diffuse HK, mild MR, moderate LAE, mild RAE, PASP 42 mmHg  //  Echo 12/16: Mild LVH, EF 35-40%, diffuse HK, Gr 2 DD, MAC, mild MR, mod LAE, normal RVSF, PASP 49 mmHg // Echo 5/19: EF 15-20, diff HK, Gr 2 DD, trivial AI, MAC, trivial MR, mod LAE, normal RVSF, mild TR, PASP 62 // Limited Echo 8/19: mod LVH, EF 40-45, diff HK, Gr 1 DD, MAC, mild LAE   . Chronic left shoulder pain 04/10/2013  . Chronic lung disease   . Chronic pain   . Chronic pain syndrome 07/20/2017  . Depression 08/25/2016  . DJD (degenerative joint disease) 10/12/2012   Multiple joint replacements by Dr. Collier Salina.   . Dyshidrotic eczema 12/31/2014  . Essential hypertension 07/20/2017  . GERD (gastroesophageal reflux disease)   . Gout   . H/O slipped capital femoral epiphysis (SCFE)   . History of total right hip replacement 04/02/2019   Followed closely with orthopedist Dr. Alvan Dame. Patient is s/p total right hip replacement in 1980's with evidence of bone loss and osteopenia making reconstruction unlikely. Plan for conservative treatment at this time with crutches for ambulation and pain control.  . Hyperlipidemia   . Hypertension   . Hypertensive heart disease with CHF (congestive heart failure) ([place]) 10/12/2012  . Loss of taste 02/06/2018  . Mass of  breast, left 07/21/2014  . NICM (nonischemic cardiomyopathy) ([place]) 06/09/2016   A. LHC 7/17: Normal coronary arteries, EF 30-35%, LVEDP 14 mmHg  . Pain in gums 04/18/2018  . Pre-diabetes   . Right hip pain 11/02/2012  . Tendonitis, Achilles, left 05/24/2017  . Tobacco abuse 09/29/2014  . Vitamin D deficiency 12/31/2014   Past Surgical History:  Procedure Laterality Date  . CARDIAC CATHETERIZATION  2005   normal-Hochrein  . CARDIAC CATHETERIZATION N/A 05/11/2016   Procedure: Right/Left Heart Cath and Coronary Angiography;  Surgeon: Peter M Martinique, MD;  Location: [facility];  Service: Cardiovascular;  Laterality: N/A;  . CESAREAN SECTION    . COLONOSCOPY  1990's ??  . HIP FUSION Left    left   . JOINT REPLACEMENT Bilateral 1991, 1997   [place]  . ROTATOR CUFF REPAIR     bilateral  . TOOTH EXTRACTION N/A 04/03/2020   Procedure: DENTAL RESTORATION/EXTRACTIONS;  Surgeon: [name], DDS;  Location: [facility];  Service: Oral Surgery;  Laterality: N/A;  . [place]   right. Dr. Collier Salina  . TUBAL LIGATION     Social History   Socioeconomic History  . Marital status: Married    Spouse name: Not on file  . Number of children: Not on file  . Years of education: Not on file  . Highest education level: Not on file  Occupational History  . Not on file  Tobacco Use  . Smoking status: Former Smoker    Packs/day: 0.50    Years: 55.00    Pack years: 27.50    Quit date: 2017    Years since quitting: 5.0  . Smokeless tobacco: Never Used  Vaping Use  . Vaping Use: Never used  Substance and Sexual Activity  . Alcohol use: Not Currently    Comment: on holidays  . Drug use: No  . Sexual activity: Not on file  Other Topics Concern  . Not on file  Social History Narrative   Formerly daycare provider. 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   Social Determinants of Health   Financial Resource Strain: Not on file  Food Insecurity: Not on file   Transportation Needs: Not on file  Physical Activity: Not on file  Stress: Not on file  Social Connections: Not on file   Allergies  Allergen Reactions  . Crab [Shellfish Allergy]     Itching to lips  . Morphine And Related Itching and Swelling   Family History  Problem Relation Age of Onset  . Depression Mother   . Diabetes Mother   . Hypertension Mother   . Stroke Mother   . Heart disease Mother   . Cancer Father        unknown primary  . Diabetes Sister   . Stomach cancer Brother   . Throat cancer Son   . Breast cancer Neg Hx   . Esophageal cancer Neg Hx   . Colon cancer Neg Hx   . Colon polyps Neg Hx      Current Outpatient Medications (Cardiovascular):  .  atorvastatin (LIPITOR) 40 MG tablet, Take 1 tablet (40 mg total) by mouth daily. .  carvedilol (COREG) 3.125 MG tablet, TAKE 1 TABLET (3.125 MG TOTAL) BY MOUTH 2 (TWO) TIMES DAILY. .  furosemide (LASIX) 20 MG tablet, TAKE 1 TABLET BY MOUTH EVERY DAY .  furosemide (LASIX) 20 MG tablet, TAKE 20 MG TABLET BY MOUTH ON Monday, WED, FRI .  hydrALAZINE (APRESOLINE) 10 MG tablet, Take 2 tablets (20 mg total) by mouth 3 (three) times daily. [name]  spironolactone (ALDACTONE) 25 MG tablet, Take 0.5 tablets (12.5 mg total) by mouth daily.  Current Outpatient Medications (Respiratory):  .  albuterol (PROVENTIL HFA;VENTOLIN HFA) 108 (90 Base) MCG/ACT inhaler, Inhale 1-2 puffs into the lungs every 6 (six) hours as needed for wheezing.  Current Outpatient Medications (Analgesics):  .  aspirin 81 MG tablet, Take 1 tablet (81 mg total) by mouth daily. [name]  HYDROcodone-acetaminophen (NORCO/VICODIN) 5-325 MG tablet, Take 1 tablet by mouth every 6 (six) hours as needed for moderate pain.   Current Outpatient Medications (Other):  .  Blood Glucose Monitoring Suppl (ONETOUCH VERIO) w/Device KIT, Please use to check blood sugar once daily. E11.9 .  gabapentin (NEURONTIN) 100 MG capsule, TAKE 2 CAPSULES BY MOUTH AT BEDTIME. [name]  glucose  blood (ONETOUCH VERIO) test strip, Please use to check blood sugar once daily. E11.9 .  Lancet Devices (ONE TOUCH DELICA LANCING DEV) MISC, Please use to check blood sugar once daily. E11.9 .  OneTouch Delica Lancets 83M MISC, Please use to check blood sugar once daily. E11.9 .  Potassium Chloride ER 20 MEQ TBCR, Take 1 tablet by mouth as needed (When taking Lasix).   Reviewed prior external information including notes and imaging from  primary care provider As well as notes that were available from care everywhere and other healthcare systems.  Past medical history, social, surgical and  family history all reviewed in electronic medical record.  No pertanent information unless stated regarding to the chief complaint.   Review of Systems:  No headache, visual changes, nausea, vomiting, diarrhea, constipation, dizziness, abdominal pain, skin rash, fevers, chills, night sweats, weight loss, swollen lymph nodes, body aches, joint swelling, chest pain, shortness of breath, mood changes. POSITIVE muscle aches  Objective  There were no vitals taken for this visit.   General: No apparent distress alert and oriented x3 mood and affect normal, dressed appropriately.  HEENT: Pupils equal, extraocular movements intact  Respiratory: Patient's speak in full sentences and does not appear short of breath  Cardiovascular: No lower extremity edema, non tender, no erythema  Neuro: Cranial nerves II through XII are intact, neurovascularly intact in all extremities with 2+ DTRs and 2+ pulses.  Gait normal with good balance and coordination.  MSK:  Non tender with full range of motion and good stability and symmetric strength and tone of shoulders, elbows, wrist, hip, knee and ankles bilaterally.     Impression and Recommendations:     The above documentation has been reviewed and is accurate and complete Jacqualin Combes

## 2020-11-18 ENCOUNTER — Other Ambulatory Visit: Payer: Self-pay | Admitting: Family Medicine

## 2020-11-18 DIAGNOSIS — G629 Polyneuropathy, unspecified: Secondary | ICD-10-CM

## 2020-11-18 DIAGNOSIS — M1A09X0 Idiopathic chronic gout, multiple sites, without tophus (tophi): Secondary | ICD-10-CM

## 2020-11-18 DIAGNOSIS — Z96641 Presence of right artificial hip joint: Secondary | ICD-10-CM

## 2020-11-18 DIAGNOSIS — G894 Chronic pain syndrome: Secondary | ICD-10-CM

## 2020-11-18 MED ORDER — HYDROCODONE-ACETAMINOPHEN 5-325 MG PO TABS: 1.0000 | ORAL_TABLET | Freq: Four times a day (QID) | ORAL | 0 refills | Status: DC | PRN

## 2020-11-20 ENCOUNTER — Ambulatory Visit: Payer: Medicare Other

## 2020-11-21 ENCOUNTER — Other Ambulatory Visit: Payer: Self-pay | Admitting: Family Medicine

## 2020-11-21 DIAGNOSIS — G894 Chronic pain syndrome: Secondary | ICD-10-CM

## 2020-11-25 NOTE — Progress Notes (Unsigned)
[place] Phone: [phone] Subjective:   Donna Herrera, am serving as a scribe for Dr. Hulan Saas. This visit occurred during the SARS-CoV-2 public health emergency.  Safety protocols were in place, including screening questions prior to the visit, additional usage of staff PPE, and extensive cleaning of exam room while observing appropriate contact time as indicated for disinfecting solutions.   I'm seeing this patient by the request  of:  Danna Hefty, DO  CC: Neck pain   GUR:[id number]  Donna Herrera is a 75 y.o. female with a significant number of comorbidities coming in with complaint of bilateral shoulder and neck pain. Numbness and tingling. Patient has had pain for 2 years. Has tried Tylenol and stretching. Pain is worse during the day when she stands up and when she is lying down at night. Feels a knot in the trap on both sides.  Patient states that it is fairly severe.  Patient does have to use crutches and has used on most of her life secondary to the SCFE.  Patient states that this does give her discomfort on the shoulders as well.  Patient has had previous surgeries of the shoulders.  Patient was recently started on gabapentin by primary care provider but has not noticed significant improvement yet.  Patient does not have quite any other guidance at the moment of what else to do.  Looking for some relief in the neck..  Previous xray 04/2017- severe DDD diffuse of cervical spine    Past Medical History:  Diagnosis Date  . Allergy   . Arthritis   . Candidiasis, mouth 10/19/2017  . Chronic combined systolic and diastolic CHF (congestive heart failure) ([facility]) 10/21/2015   Echo 3/16: EF 40%, diffuse HK, mild MR, moderate LAE, mild RAE, PASP 42 mmHg  //  Echo 12/16: Mild LVH, EF 35-40%, diffuse HK, Gr 2 DD, MAC, mild MR, mod LAE, normal RVSF, PASP 49 mmHg // Echo 5/19: EF 15-20, diff HK, Gr 2  DD, trivial AI, MAC, trivial MR, mod LAE, normal RVSF, mild TR, PASP 62 // Limited Echo 8/19: mod LVH, EF 40-45, diff HK, Gr 1 DD, MAC, mild LAE   . Chronic left shoulder pain 04/10/2013  . Chronic lung disease   . Chronic pain   . Chronic pain syndrome 07/20/2017  . Depression 08/25/2016  . DJD (degenerative joint disease) 10/12/2012   Multiple joint replacements by Dr. Collier Salina.   . Dyshidrotic eczema 12/31/2014  . Essential hypertension 07/20/2017  . GERD (gastroesophageal reflux disease)   . Gout   . H/O slipped capital femoral epiphysis (SCFE)   . History of total right hip replacement 04/02/2019   Followed closely with orthopedist Dr. Alvan Dame. Patient is s/p total right hip replacement in 1980's with evidence of bone loss and osteopenia making reconstruction unlikely. Plan for conservative treatment at this time with crutches for ambulation and pain control.  . Hyperlipidemia   . Hypertension   . Hypertensive heart disease with CHF (congestive heart failure) ([facility]) 10/12/2012  . Loss of taste 02/06/2018  . Mass of breast, left 07/21/2014  . NICM (nonischemic cardiomyopathy) ([facility] [facility]) 06/09/2016   A. LHC 7/17: Normal coronary arteries, EF 30-35%, LVEDP 14 mmHg  . Pain in gums 04/18/2018  . Pre-diabetes   . Right hip pain 11/02/2012  . Tendonitis, Achilles, left 05/24/2017  . Tobacco abuse 09/29/2014  . Vitamin D deficiency 12/31/2014   Past Surgical History:  Procedure Laterality  Date  . CARDIAC CATHETERIZATION  2005   normal-[place]ochrein  . CARDIAC CATHETERIZATION N/A 05/11/2016   Procedure: Right/Left Heart Cath and Coronary Angiography;  Surgeon: Peter M Martinique, MD;  Location: [facility];  Service: Cardiovascular;  Laterality: N/A;  . CESAREAN SECTION    . COLONOSCOPY  1990's ??  . HIP FUSION Left    left   . JOINT REPLACEMENT Bilateral 1991, 1997   [place]  . ROTATOR CUFF REPAIR     bilateral  . TOOTH EXTRACTION N/A 04/03/2020   Procedure: DENTAL RESTORATION/EXTRACTIONS;  Surgeon:  [name], DDS;  Location: [facility];  Service: Oral Surgery;  Laterality: N/A;  . [place]   right. Dr. Collier Salina  . TUBAL LIGATION     Social History   Socioeconomic History  . Marital status: Married    Spouse name: Not on file  . Number of children: Not on file  . Years of education: Not on file  . Highest education level: Not on file  Occupational History  . Not on file  Tobacco Use  . Smoking status: Former Smoker    Packs/day: 0.50    Years: 55.00    Pack years: 27.50    Quit date: 2017    Years since quitting: 5.0  . Smokeless tobacco: Never Used  Vaping Use  . Vaping Use: Never used  Substance and Sexual Activity  . Alcohol use: Not Currently    Comment: on holidays  . Drug use: Herrera  . Sexual activity: Not on file  Other Topics Concern  . Not on file  Social History Narrative   Formerly daycare provider. Lives with husband, daughter Donna Herrera and grandkids.   Social Determinants of Health   Financial Resource Strain: Not on file  Food Insecurity: Not on file  Transportation Needs: Not on file  Physical Activity: Not on file  Stress: Not on file  Social Connections: Not on file   Allergies  Allergen Reactions  . Crab [Shellfish Allergy]     Itching to lips  . Morphine And Related Itching and Swelling   Family History  Problem Relation Age of Onset  . Depression Mother   . Diabetes Mother   . Hypertension Mother   . Stroke Mother   . Heart disease Mother   . Cancer Father        unknown primary  . Diabetes Sister   . Stomach cancer Brother   . Throat cancer Son   . Breast cancer Neg Hx   . Esophageal cancer Neg Hx   . Colon cancer Neg Hx   . Colon polyps Neg Hx      Current Outpatient Medications (Cardiovascular):  .  atorvastatin (LIPITOR) 40 MG tablet, Take 1 tablet (40 mg total) by mouth daily. .  carvedilol (COREG) 3.125 MG tablet, TAKE 1 TABLET (3.125 MG TOTAL) BY MOUTH 2 (TWO) TIMES DAILY. .  furosemide (LASIX) 20 MG  tablet, TAKE 1 TABLET BY MOUTH EVERY DAY .  furosemide (LASIX) 20 MG tablet, TAKE 20 MG TABLET BY MOUTH ON Monday, WED, FRI .  hydrALAZINE (APRESOLINE) 10 MG tablet, Take 2 tablets (20 mg total) by mouth 3 (three) times daily. [name]  spironolactone (ALDACTONE) 25 MG tablet, Take 0.5 tablets (12.5 mg total) by mouth daily.  Current Outpatient Medications (Respiratory):  .  albuterol (PROVENTIL HFA;VENTOLIN HFA) 108 (90 Base) MCG/ACT inhaler, Inhale 1-2 puffs into the lungs every 6 (six) hours as needed for wheezing.  Current Outpatient Medications (  Analgesics):  .  aspirin 81 MG tablet, Take 1 tablet (81 mg total) by mouth daily. .  colchicine 0.6 MG tablet, TAKE 1 TABLET BY MOUTH EVERY DAY .  HYDROcodone-acetaminophen (NORCO/VICODIN) 5-325 MG tablet, Take 1 tablet by mouth every 6 (six) hours as needed for moderate pain.   Current Outpatient Medications (Other):  [name]  AMBULATORY NON FORMULARY MEDICATION, 1 Units by Other route once for 1 dose. .  Blood Glucose Monitoring Suppl (ONETOUCH VERIO) w/Device KIT, Please use to check blood sugar once daily. E11.9 .  gabapentin (NEURONTIN) 100 MG capsule, Take 2 capsules (200 mg total) by mouth at bedtime. [name]  glucose blood (ONETOUCH VERIO) test strip, Please use to check blood sugar once daily. E11.9 .  Lancet Devices (ONE TOUCH DELICA LANCING DEV) MISC, Please use to check blood sugar once daily. E11.9 .  OneTouch Delica Lancets 81O MISC, Please use to check blood sugar once daily. E11.9 .  Potassium Chloride ER 20 MEQ TBCR, Take 1 tablet by mouth as needed (When taking Lasix).   Reviewed prior external information including notes and imaging from  primary care provider As well as notes that were available from care everywhere and other healthcare systems.  Past medical history, social, surgical and family history all reviewed in electronic medical record.  Herrera pertanent information unless stated regarding to the chief complaint.   Review of  Systems:  Herrera visual changes, nausea, vomiting, diarrhea, constipation, dizziness, abdominal pain, skin rash, fevers, chills, night sweats, weight loss, swollen lymph nodes, , chest pain, shortness of breath, mood changes. POSITIVE muscle aches, body aches, joint swelling, headache  Objective  Blood pressure 122/76, pulse (!) 50, height _0  (1.727 m), weight 188 lb (85.3 kg), SpO2 99 %.   General: Herrera apparent distress alert and oriented x3 mood and affect normal, dressed appropriately.  HEENT: Pupils equal, extraocular movements intact  Respiratory: Patient's speak in full sentences and does not appear short of breath  Cardiovascular: Trace Patient ambulates with the aid of crutches. Patient has significant arthritic changes of multiple joints including patient's hands and fingers. Severe CMC arthritis noted. Patient does have even rotator cuff arthropathy with significant crepitus and decreased range of motion in all planes.  Patient has only 3 out of 5 strength of the rotator cuff bilaterally.  Seems to be neurovascularly intact distally. Neck exam has limited range of motion especially with side bending and rotation.  Only 5 of extension.  Crepitus noted there as well.  Tender to palpation even to the light sensation in the paraspinal musculature of the cervical and thoracic spine.  Herrera midline tenderness noted.   Impression and Recommendations:     The above documentation has been reviewed and is accurate and complete Lyndal Pulley, DO

## 2020-11-26 ENCOUNTER — Encounter: Payer: Self-pay | Admitting: Family Medicine

## 2020-11-26 ENCOUNTER — Ambulatory Visit: Payer: Medicare Other | Admitting: Family Medicine

## 2020-11-26 ENCOUNTER — Ambulatory Visit: Payer: Medicare Other

## 2020-11-26 ENCOUNTER — Other Ambulatory Visit: Payer: Self-pay

## 2020-11-26 ENCOUNTER — Ambulatory Visit (INDEPENDENT_AMBULATORY_CARE_PROVIDER_SITE_OTHER): Payer: Medicare Other

## 2020-11-26 VITALS — BP 122/76 | HR 50 | Ht 68.0 in | Wt 188.0 lb

## 2020-11-26 DIAGNOSIS — M12812 Other specific arthropathies, not elsewhere classified, left shoulder: Secondary | ICD-10-CM | POA: Diagnosis not present

## 2020-11-26 DIAGNOSIS — M542 Cervicalgia: Secondary | ICD-10-CM

## 2020-11-26 DIAGNOSIS — M5030 Other cervical disc degeneration, unspecified cervical region: Secondary | ICD-10-CM | POA: Insufficient documentation

## 2020-11-26 DIAGNOSIS — M25511 Pain in right shoulder: Secondary | ICD-10-CM

## 2020-11-26 DIAGNOSIS — M12811 Other specific arthropathies, not elsewhere classified, right shoulder: Secondary | ICD-10-CM

## 2020-11-26 DIAGNOSIS — M25512 Pain in left shoulder: Secondary | ICD-10-CM | POA: Diagnosis not present

## 2020-11-26 DIAGNOSIS — R0781 Pleurodynia: Secondary | ICD-10-CM | POA: Diagnosis not present

## 2020-11-26 DIAGNOSIS — G8929 Other chronic pain: Secondary | ICD-10-CM | POA: Diagnosis not present

## 2020-11-26 DIAGNOSIS — M19012 Primary osteoarthritis, left shoulder: Secondary | ICD-10-CM | POA: Diagnosis not present

## 2020-11-26 DIAGNOSIS — M19011 Primary osteoarthritis, right shoulder: Secondary | ICD-10-CM | POA: Diagnosis not present

## 2020-11-26 DIAGNOSIS — M47812 Spondylosis without myelopathy or radiculopathy, cervical region: Secondary | ICD-10-CM | POA: Diagnosis not present

## 2020-11-26 MED ORDER — AMBULATORY NON FORMULARY MEDICATION: 1.0000 [IU] | Freq: Once | 0 refills | Status: AC

## 2020-11-26 NOTE — Assessment & Plan Note (Signed)
Patient has significant arthritic changes of the neck as well as the bilateral shoulders.  Concerned that this is osteoarthritic as well as likely gouty arthropathy.  Discussed with patient in great length.  Patient will start with formal physical therapy.  We will attempt to give patient different crutches that may not put so much pressure on patient's shoulders and neck.  I think if patient is able to transition to other type of modalities to help her with ambulation this may help some of the discomfort and pain.  Patient was given gabapentin by primary care provider and I do think that this is a good medication and continue this at nighttime.  I do not think patient is a candidate for osteopathic manipulation.  Patient will try this conservative therapy and see me again in 6 to 8 weeks.

## 2020-11-26 NOTE — Assessment & Plan Note (Signed)
Appears to be bilateral.  We will get x-rays.  And start with formal physical therapy.  Will try to see if different crutches will be beneficial.  Follow-up again 6 to 8 weeks

## 2020-11-26 NOTE — Assessment & Plan Note (Signed)
Patient did complain of some rib pain.  More pain in the axillary area.  Could be secondary to the crutches.  I did not feel any type of mass.  Patient is scheduled to have a mammogram in February she states.  Patient will follow up with primary care provider

## 2020-11-26 NOTE — Patient Instructions (Addendum)
Rx for cuff crutches Exercises Xray today PT will call you Continue gabapentin See me again in 6-7 weeks

## 2020-11-27 ENCOUNTER — Other Ambulatory Visit: Payer: Self-pay

## 2020-11-27 ENCOUNTER — Telehealth (INDEPENDENT_AMBULATORY_CARE_PROVIDER_SITE_OTHER): Payer: Medicare Other | Admitting: Pharmacist

## 2020-11-27 DIAGNOSIS — I5042 Chronic combined systolic (congestive) and diastolic (congestive) heart failure: Secondary | ICD-10-CM

## 2020-11-27 DIAGNOSIS — I110 Hypertensive heart disease with heart failure: Secondary | ICD-10-CM

## 2020-11-27 NOTE — Progress Notes (Signed)
Patient ID: Donna Herrera                 DOB: Jun 15, 1946                      MRN: [id number]     HPI: Donna Herrera is a 75 y.o. female referred by Dr. Harrington Challenger to HTN clinic. PMH is significant for systolic CHF LVEF 08-14% on Echo 06/16/18 (improved from 15-20% Echo 02/2018), nonischemic cardiomyopathy, HTN, HLD, history of tobacco use, COPD, and GERD. She was previously on Entresto but this was discontinued secondary to hypotension. She was tried on low dose lisinopril but was stopped d/t worsening cough (it was reported that she had a small cough secondary to COPD prior to starting the medication). She was then placed on hydralazine. She was last seen by Dr. Harrington Challenger in office on 10/09/2020 for CHF f/u. During visit, BP 140/80 elevated above goal and hydralazine was increased to 20 mg TID. Blood pressure in Sports Medicine office on 11/26/20 at goal of 122/76 mmHg.   Called patient today to discuss how her BP has been doing since hydralazine was increased at last visit. She reports having blood pressures running between the 116-130's/78-80. She denies dizziness and blurry vision. She does endorse some mild headaches which she attributes to muscle pain from her neck and shoulder. When she does have the mild headache, she takes tylenol. She reports being confused about some of her medications. She had not been taking atorvastatin secondary to not knowing what it was for and how she was supposed to take it. However, she does endorse being compliant with her other BP medications. She checked her BP while on the phone with me and reported 160/76, and then 152/75 on recheck (of note, she had been walking from one end of her house to the other prior to checking). She has some issues with transportation as her daughter is on dialysis and she has to arrange rides to get to appointments.   Current HTN meds:  Carvedilol 3.125 mg BID Hydralazine 20 mg TID - prescribed 10 mg tabs and taking 2 tabs  TID Spironolactone 12.5 mg daily - prescribed 25 mg tabs and taking 1/2 tab daily Furosemide 20 mg tab 3x/week  Previously tried:  Lisinopril 2.5 mg - cough Entresto 49/51 mg - hypotension Losartan 100 mg - does not remember what happened metorolol succinate 50 mg daily -  BP goal: < 130/80 mmHg  Family History: The patient's family history includes Cancer in her father; Depression in her mother; Diabetes in her mother and sister; Heart disease in her mother; Hypertension in her mother; Stomach cancer in her brother; Stroke in her mother; Throat cancer in her son. There is no history of Breast cancer, Esophageal cancer, Colon cancer, or Colon polyps.  Social History: has 4 great grandkids  Diet: chicken, rice, peas, vegetables; makes homemade soup  Exercise: cook, stays inside, when warm, usually has a garden  Home BP readings: ranging in the 120-130's/78-80's  Labs:  09/16/20: Na 142, K 4.7, Scr 0.81 08/19/20: Na 139, K 4.4, Scr 1.04 Wt Readings from Last 3 Encounters:  11/26/20 188 lb (85.3 kg)  10/09/20 185 lb (83.9 kg)  09/16/20 188 lb (85.3 kg)   BP Readings from Last 3 Encounters:  11/26/20 122/76  10/09/20 140/80  09/16/20 (!) 142/80   Pulse Readings from Last 3 Encounters:  11/26/20 (!) 50  10/09/20 68  09/16/20 69    Renal function: CrCl cannot  be calculated (Patient's most recent lab result is older than the maximum 21 days allowed.).  Past Medical History:  Diagnosis Date  . Allergy   . Arthritis   . Candidiasis, mouth 10/19/2017  . Chronic combined systolic and diastolic CHF (congestive heart failure) ([facility]) 10/21/2015   Echo 3/16: EF 40%, diffuse HK, mild MR, moderate LAE, mild RAE, PASP 42 mmHg  //  Echo 12/16: Mild LVH, EF 35-40%, diffuse HK, Gr 2 DD, MAC, mild MR, mod LAE, normal RVSF, PASP 49 mmHg // Echo 5/19: EF 15-20, diff HK, Gr 2 DD, trivial AI, MAC, trivial MR, mod LAE, normal RVSF, mild TR, PASP 62 // Limited Echo 8/19: mod LVH, EF 40-45, diff  HK, Gr 1 DD, MAC, mild LAE   . Chronic left shoulder pain 04/10/2013  . Chronic lung disease   . Chronic pain   . Chronic pain syndrome 07/20/2017  . Depression 08/25/2016  . DJD (degenerative joint disease) 10/12/2012   Multiple joint replacements by Dr. Collier Salina.   . Dyshidrotic eczema 12/31/2014  . Essential hypertension 07/20/2017  . GERD (gastroesophageal reflux disease)   . Gout   . H/O slipped capital femoral epiphysis (SCFE)   . History of total right hip replacement 04/02/2019   Followed closely with orthopedist Dr. Alvan Dame. Patient is s/p total right hip replacement in 1980's with evidence of bone loss and osteopenia making reconstruction unlikely. Plan for conservative treatment at this time with crutches for ambulation and pain control.  . Hyperlipidemia   . Hypertension   . Hypertensive heart disease with CHF (congestive heart failure) ([facility]) 10/12/2012  . Loss of taste 02/06/2018  . Mass of breast, left 07/21/2014  . NICM (nonischemic cardiomyopathy) ([facility]) 06/09/2016   A. LHC 7/17: Normal coronary arteries, EF 30-35%, LVEDP 14 mmHg  . Pain in gums 04/18/2018  . Pre-diabetes   . Right hip pain 11/02/2012  . Tendonitis, Achilles, left 05/24/2017  . Tobacco abuse 09/29/2014  . Vitamin D deficiency 12/31/2014    Current Outpatient Medications on File Prior to Visit  Medication Sig Dispense Refill  . albuterol (PROVENTIL HFA;VENTOLIN HFA) 108 (90 Base) MCG/ACT inhaler Inhale 1-2 puffs into the lungs every 6 (six) hours as needed for wheezing. 1 Inhaler 0  . aspirin 81 MG tablet Take 1 tablet (81 mg total) by mouth daily. 90 tablet 3  . atorvastatin (LIPITOR) 40 MG tablet Take 1 tablet (40 mg total) by mouth daily. 90 tablet 3  . Blood Glucose Monitoring Suppl (ONETOUCH VERIO) w/Device KIT Please use to check blood sugar once daily. E11.9 1 kit 0  . carvedilol (COREG) 3.125 MG tablet TAKE 1 TABLET (3.125 MG TOTAL) BY MOUTH 2 (TWO) TIMES DAILY. 180 tablet 2  . colchicine 0.6 MG tablet TAKE 1  TABLET BY MOUTH EVERY DAY 30 tablet 2  . furosemide (LASIX) 20 MG tablet TAKE 1 TABLET BY MOUTH EVERY DAY 90 tablet 3  . furosemide (LASIX) 20 MG tablet TAKE 20 MG TABLET BY MOUTH ON Monday, WED, FRI    . gabapentin (NEURONTIN) 100 MG capsule Take 2 capsules (200 mg total) by mouth at bedtime. 60 capsule 0  . glucose blood (ONETOUCH VERIO) test strip Please use to check blood sugar once daily. E11.9 100 each 12  . hydrALAZINE (APRESOLINE) 10 MG tablet Take 2 tablets (20 mg total) by mouth 3 (three) times daily. 180 tablet 6  . HYDROcodone-acetaminophen (NORCO/VICODIN) 5-325 MG tablet Take 1 tablet by mouth every 6 (six) hours as needed for moderate  pain. 30 tablet 0  . Lancet Devices (ONE TOUCH DELICA LANCING DEV) MISC Please use to check blood sugar once daily. E11.9 1 each 0  . OneTouch Delica Lancets 37J MISC Please use to check blood sugar once daily. E11.9 100 each 3  . Potassium Chloride ER 20 MEQ TBCR Take 1 tablet by mouth as needed (When taking Lasix). 90 tablet 0  . spironolactone (ALDACTONE) 25 MG tablet Take 0.5 tablets (12.5 mg total) by mouth daily. 45 tablet 3   No current facility-administered medications on file prior to visit.    Allergies  Allergen Reactions  . Crab [Shellfish Allergy]     Itching to lips  . Morphine And Related Itching and Swelling     Assessment/Plan:  1. Hypertension - BP while on the phone 152/75 above goal of <130/80 mmHg. However, blood pressure in clinic yesterday at goal <130/80 and other home BP readings seem to be at goal. Adherence optimal per patient however patient appears confused about some of the medications she is taking and would benefit from medication review with a pharmacist. Will continue all blood pressure medications with no changes. Will f/u with patient in 3 weeks for a full medication review in clinic.   Due to patient's HF diagnosis, may benefit from future ARB addition. Pt does not remember why losartan was stopped and she  would be open to retrying. Additionally, pt on potassium supplementation with recent K of 4.7. Can consider stopping if adding ARB is added or if spironolactone is increased.  Juluis Pitch, PharmD Candidate  Ramond Dial, [place].D, BCPS, CPP [place]  [address] [place], [place] [address]  Phone: [phone]; Fax: [phone]

## 2020-11-30 ENCOUNTER — Telehealth: Payer: Self-pay | Admitting: Family Medicine

## 2020-11-30 NOTE — Telephone Encounter (Signed)
Patient is calling stating she is needing to speak with the doctor or nurse about physical therapy. Please advise. Thanks!

## 2020-12-01 ENCOUNTER — Other Ambulatory Visit: Payer: Self-pay

## 2020-12-01 DIAGNOSIS — M25519 Pain in unspecified shoulder: Secondary | ICD-10-CM

## 2020-12-03 DIAGNOSIS — F32A Depression, unspecified: Secondary | ICD-10-CM | POA: Diagnosis not present

## 2020-12-03 DIAGNOSIS — I110 Hypertensive heart disease with heart failure: Secondary | ICD-10-CM | POA: Diagnosis not present

## 2020-12-03 DIAGNOSIS — I5042 Chronic combined systolic (congestive) and diastolic (congestive) heart failure: Secondary | ICD-10-CM | POA: Diagnosis not present

## 2020-12-03 DIAGNOSIS — M5030 Other cervical disc degeneration, unspecified cervical region: Secondary | ICD-10-CM | POA: Diagnosis not present

## 2020-12-03 DIAGNOSIS — Z96611 Presence of right artificial shoulder joint: Secondary | ICD-10-CM | POA: Diagnosis not present

## 2020-12-03 DIAGNOSIS — Z4789 Encounter for other orthopedic aftercare: Secondary | ICD-10-CM | POA: Diagnosis not present

## 2020-12-04 ENCOUNTER — Other Ambulatory Visit: Payer: Self-pay | Admitting: Family Medicine

## 2020-12-04 DIAGNOSIS — G629 Polyneuropathy, unspecified: Secondary | ICD-10-CM

## 2020-12-04 MED ORDER — GABAPENTIN 100 MG PO CAPS: 200.0000 mg | ORAL_CAPSULE | Freq: Every day | ORAL | 1 refills | Status: DC

## 2020-12-04 NOTE — Telephone Encounter (Signed)
Attempted to contact patient multiple times without success.  Appears referral has been placed for home health and physical therapist by Dr. Tamala Julian.

## 2020-12-07 DIAGNOSIS — M5030 Other cervical disc degeneration, unspecified cervical region: Secondary | ICD-10-CM | POA: Diagnosis not present

## 2020-12-07 DIAGNOSIS — I5042 Chronic combined systolic (congestive) and diastolic (congestive) heart failure: Secondary | ICD-10-CM | POA: Diagnosis not present

## 2020-12-07 DIAGNOSIS — Z96611 Presence of right artificial shoulder joint: Secondary | ICD-10-CM | POA: Diagnosis not present

## 2020-12-07 DIAGNOSIS — Z4789 Encounter for other orthopedic aftercare: Secondary | ICD-10-CM | POA: Diagnosis not present

## 2020-12-07 DIAGNOSIS — F32A Depression, unspecified: Secondary | ICD-10-CM | POA: Diagnosis not present

## 2020-12-07 DIAGNOSIS — I110 Hypertensive heart disease with heart failure: Secondary | ICD-10-CM | POA: Diagnosis not present

## 2020-12-08 DIAGNOSIS — Z96611 Presence of right artificial shoulder joint: Secondary | ICD-10-CM | POA: Diagnosis not present

## 2020-12-08 DIAGNOSIS — I5042 Chronic combined systolic (congestive) and diastolic (congestive) heart failure: Secondary | ICD-10-CM | POA: Diagnosis not present

## 2020-12-08 DIAGNOSIS — Z4789 Encounter for other orthopedic aftercare: Secondary | ICD-10-CM | POA: Diagnosis not present

## 2020-12-08 DIAGNOSIS — F32A Depression, unspecified: Secondary | ICD-10-CM | POA: Diagnosis not present

## 2020-12-08 DIAGNOSIS — I110 Hypertensive heart disease with heart failure: Secondary | ICD-10-CM | POA: Diagnosis not present

## 2020-12-08 DIAGNOSIS — M5030 Other cervical disc degeneration, unspecified cervical region: Secondary | ICD-10-CM | POA: Diagnosis not present

## 2020-12-10 DIAGNOSIS — Z4789 Encounter for other orthopedic aftercare: Secondary | ICD-10-CM | POA: Diagnosis not present

## 2020-12-10 DIAGNOSIS — Z96611 Presence of right artificial shoulder joint: Secondary | ICD-10-CM | POA: Diagnosis not present

## 2020-12-10 DIAGNOSIS — I110 Hypertensive heart disease with heart failure: Secondary | ICD-10-CM | POA: Diagnosis not present

## 2020-12-10 DIAGNOSIS — F32A Depression, unspecified: Secondary | ICD-10-CM | POA: Diagnosis not present

## 2020-12-10 DIAGNOSIS — I5042 Chronic combined systolic (congestive) and diastolic (congestive) heart failure: Secondary | ICD-10-CM | POA: Diagnosis not present

## 2020-12-10 DIAGNOSIS — M5030 Other cervical disc degeneration, unspecified cervical region: Secondary | ICD-10-CM | POA: Diagnosis not present

## 2020-12-14 ENCOUNTER — Ambulatory Visit
Admission: RE | Admit: 2020-12-14 | Discharge: 2020-12-14 | Disposition: A | Discharge status: Home / Self Care | Payer: Medicare Other | Source: Ambulatory Visit | Attending: Family Medicine | Admitting: Family Medicine

## 2020-12-14 ENCOUNTER — Ambulatory Visit (INDEPENDENT_AMBULATORY_CARE_PROVIDER_SITE_OTHER): Payer: Medicare Other | Admitting: Pharmacist

## 2020-12-14 ENCOUNTER — Other Ambulatory Visit: Payer: Self-pay

## 2020-12-14 DIAGNOSIS — I10 Essential (primary) hypertension: Secondary | ICD-10-CM | POA: Diagnosis not present

## 2020-12-14 DIAGNOSIS — I110 Hypertensive heart disease with heart failure: Secondary | ICD-10-CM

## 2020-12-14 DIAGNOSIS — I428 Other cardiomyopathies: Secondary | ICD-10-CM | POA: Diagnosis not present

## 2020-12-14 DIAGNOSIS — I5042 Chronic combined systolic (congestive) and diastolic (congestive) heart failure: Secondary | ICD-10-CM | POA: Diagnosis not present

## 2020-12-14 DIAGNOSIS — Z1231 Encounter for screening mammogram for malignant neoplasm of breast: Secondary | ICD-10-CM | POA: Diagnosis not present

## 2020-12-14 MED ORDER — CARVEDILOL 3.125 MG PO TABS: 3.1250 mg | ORAL_TABLET | Freq: Two times a day (BID) | ORAL | 1 refills | Status: DC

## 2020-12-14 NOTE — Patient Instructions (Addendum)
Call your primary care doctor Mina Marble for refills of your baclofen to help with your muscle spasms, and your colchicine to help with gout. She may increase the dose of your gabapentin if you are still having neuropathy in your hands and feet  Monitor your blood pressure. Your goal is < 130/46mmHg

## 2020-12-14 NOTE — Progress Notes (Addendum)
Patient ID: Donna Herrera                 DOB: 1946-01-06                      MRN: [id number]     HPI: Kamali Sakata is a 75 y.o. female referred by Dr. Harrington Challenger to pharmacy clinic for HF medication management. PMH is significant for systolic CHF with LVEF 70-17% (improved from 15-20% on 02/2018 echo), HTN, HLD, gout, COPD, and former tobacco use. She was previously on Entresto but this was discontinued secondary to hypotension. She was tried on low dose lisinopril but was stopped d/t worsening cough (it was reported that she had a small cough secondary to COPD prior to starting the medication). She was then placed on hydralazine. She was seen by Dr Harrington Challenger on 10/09/20 where BP was elevated at 140/80 and hydralazine was increased to 57m TID. F/u blood pressures ranged 116-130s/70s. There was some confusion about which medications pt should be taking (she had stopped her atorvastatin because she didn't know what it was for), so pt was scheduled for full medication review with PharmD.  Pt presents today for med rec review. All medications brought to today's visit. Pt taking baclofen and allopurinol which were not on our med list. Needs refill on carvedilol which has been sent in. Has a few allergy meds but rarely takes them (loratidine, cetirizine, fluticasone nasal spray). BP usually runs 1793Jsystolic, checks every other day. Has been higher up to 140 over the past few days due to pain in her hip, leg, and shoulders. Headaches are less frequent, about once a week (used to be daily). Tylenol helps, so does her head and neck PT. Still having neuropathy in her hands and feet. Has restarted her atorvastatin since last visit. Takes most meds when she wakes up around 11am, night time meds around 11pm (doesn't go to sleep until 2-3am).  She has some issues with transportation as her daughter is on dialysis and she has to arrange rides to get to appointments.   Current CHF meds:  Carvedilol 3.1255m BID - 11am, 11pm Hydralazine 2039mID (using 35m43mbs) - 11am, 4-5pm, 11pm Spironolactone 12.5mg 73mly -12pm Furosemide 20mg 24meek  Previously tried:  Lisinopril 2.5mg  -72mugh Entresto 49/51mg BI86mhypotension Losartan 100mg - d27m't recall  BP goal: <130/80mmHg  F67my History: The patient's family history includes Cancer in her father; Depression in her mother; Diabetes in her mother and sister; Heart disease in her mother; Hypertension in her mother; Stomach cancer in her brother; Stroke in her mother; Throat cancer in her son.   Social History: The patient  reports that she quit smoking about 4 years ago. She has a 27.50 pack-year smoking history. She has never used smokeless tobacco. She reports previous alcohol use. She reports that she does not use drugs.   Diet: Chicken, rice, peas, veggies, makes homemade soup   Exercise: Gardens. Ambulated with crutches today and is in pain.  Home BP readings: typically at goal  Wt Readings from Last 3 Encounters:  11/26/20 188 lb (85.3 kg)  10/09/20 185 lb (83.9 kg)  09/16/20 188 lb (85.3 kg)   BP Readings from Last 3 Encounters:  11/26/20 122/76  10/09/20 140/80  09/16/20 (!) 142/80   Pulse Readings from Last 3 Encounters:  11/26/20 (!) 50  10/09/20 68  09/16/20 69    Renal function: CrCl cannot be calculated (Patient's most recent lab result  is older than the maximum 21 days allowed.).  Past Medical History:  Diagnosis Date  . Allergy   . Arthritis   . Candidiasis, mouth 10/19/2017  . Chronic combined systolic and diastolic CHF (congestive heart failure) ([place]) 10/21/2015   Echo 3/16: EF 40%, diffuse HK, mild MR, moderate LAE, mild RAE, PASP 42 mmHg  //  Echo 12/16: Mild LVH, EF 35-40%, diffuse HK, Gr 2 DD, MAC, mild MR, mod LAE, normal RVSF, PASP 49 mmHg // Echo 5/19: EF 15-20, diff HK, Gr 2 DD, trivial AI, MAC, trivial MR, mod LAE, normal RVSF, mild TR, PASP 62 // Limited Echo 8/19: mod LVH, EF 40-45, diff HK, Gr 1  DD, MAC, mild LAE   . Chronic left shoulder pain 04/10/2013  . Chronic lung disease   . Chronic pain   . Chronic pain syndrome 07/20/2017  . Depression 08/25/2016  . DJD (degenerative joint disease) 10/12/2012   Multiple joint replacements by Dr. Collier Salina.   . Dyshidrotic eczema 12/31/2014  . Essential hypertension 07/20/2017  . GERD (gastroesophageal reflux disease)   . Gout   . H/O slipped capital femoral epiphysis (SCFE)   . History of total right hip replacement 04/02/2019   Followed closely with orthopedist Dr. Alvan Dame. Patient is s/p total right hip replacement in 1980's with evidence of bone loss and osteopenia making reconstruction unlikely. Plan for conservative treatment at this time with crutches for ambulation and pain control.  . Hyperlipidemia   . Hypertension   . Hypertensive heart disease with CHF (congestive heart failure) ([place]) 10/12/2012  . Loss of taste 02/06/2018  . Mass of breast, left 07/21/2014  . NICM (nonischemic cardiomyopathy) ([place]) 06/09/2016   A. LHC 7/17: Normal coronary arteries, EF 30-35%, LVEDP 14 mmHg  . Pain in gums 04/18/2018  . Pre-diabetes   . Right hip pain 11/02/2012  . Tendonitis, Achilles, left 05/24/2017  . Tobacco abuse 09/29/2014  . Vitamin D deficiency 12/31/2014    Current Outpatient Medications on File Prior to Visit  Medication Sig Dispense Refill  . albuterol (PROVENTIL HFA;VENTOLIN HFA) 108 (90 Base) MCG/ACT inhaler Inhale 1-2 puffs into the lungs every 6 (six) hours as needed for wheezing. 1 Inhaler 0  . aspirin 81 MG tablet Take 1 tablet (81 mg total) by mouth daily. 90 tablet 3  . atorvastatin (LIPITOR) 40 MG tablet Take 1 tablet (40 mg total) by mouth daily. 90 tablet 3  . Blood Glucose Monitoring Suppl (ONETOUCH VERIO) w/Device KIT Please use to check blood sugar once daily. E11.9 1 kit 0  . carvedilol (COREG) 3.125 MG tablet TAKE 1 TABLET (3.125 MG TOTAL) BY MOUTH 2 (TWO) TIMES DAILY. 180 tablet 2  . colchicine 0.6 MG tablet TAKE 1 TABLET  BY MOUTH EVERY DAY 30 tablet 2  . furosemide (LASIX) 20 MG tablet TAKE 1 TABLET BY MOUTH EVERY DAY 90 tablet 3  . furosemide (LASIX) 20 MG tablet TAKE 20 MG TABLET BY MOUTH ON Monday, WED, FRI    . gabapentin (NEURONTIN) 100 MG capsule Take 2 capsules (200 mg total) by mouth at bedtime. 180 capsule 1  . glucose blood (ONETOUCH VERIO) test strip Please use to check blood sugar once daily. E11.9 100 each 12  . hydrALAZINE (APRESOLINE) 10 MG tablet Take 2 tablets (20 mg total) by mouth 3 (three) times daily. 180 tablet 6  . HYDROcodone-acetaminophen (NORCO/VICODIN) 5-325 MG tablet Take 1 tablet by mouth every 6 (six) hours as needed for moderate pain. 30 tablet 0  . Lancet  Devices (ONE TOUCH DELICA LANCING DEV) MISC Please use to check blood sugar once daily. E11.9 1 each 0  . OneTouch Delica Lancets 67Q MISC Please use to check blood sugar once daily. E11.9 100 each 3  . Potassium Chloride ER 20 MEQ TBCR Take 1 tablet by mouth as needed (When taking Lasix). 90 tablet 0  . spironolactone (ALDACTONE) 25 MG tablet Take 0.5 tablets (12.5 mg total) by mouth daily. 45 tablet 3   No current facility-administered medications on file prior to visit.    Allergies  Allergen Reactions  . Crab [Shellfish Allergy]     Itching to lips  . Morphine And Related Itching and Swelling     Assessment/Plan:  1. Medication reconciliation - All meds reviewed today in office, provided pt with indications. Unable to assess BP as pt could not sit down due to hip pain; stood during visit using crutches. Home BP readings mostly controlled, will call pt this week to discuss readings in more detail as log was not brought to visit today.  Will reach out to PCP regarding refills of colchicine and baclofen as well as potential dose increase of gabapentin given ongoing peripheral neuropathy.  Jahmia Berrett E. Yeray Tomas, PharmD, BCACP, [facility] [address] [place], [place] [address] Phone: [phone]; Fax: [phone] 12/14/2020 3:33 PM   Addendum: Hulen Skains pt the next day to review her home BP log since it wasn't brought to her visit. Home BP readings: 144/72, 164/75, 128/78, 142/82, 162/82, higher over past few weeks, maybe due to pain but pt isn't sure. HR 74, 67, 61, 69. Discussed increasing hydralazine dose, pt seemed a bit confused and wants to use up her currently medication supply. Will call pt in another few weeks to f/u with BP readings.

## 2020-12-15 DIAGNOSIS — F32A Depression, unspecified: Secondary | ICD-10-CM | POA: Diagnosis not present

## 2020-12-15 DIAGNOSIS — Z4789 Encounter for other orthopedic aftercare: Secondary | ICD-10-CM | POA: Diagnosis not present

## 2020-12-15 DIAGNOSIS — I110 Hypertensive heart disease with heart failure: Secondary | ICD-10-CM | POA: Diagnosis not present

## 2020-12-15 DIAGNOSIS — M5030 Other cervical disc degeneration, unspecified cervical region: Secondary | ICD-10-CM | POA: Diagnosis not present

## 2020-12-15 DIAGNOSIS — Z96611 Presence of right artificial shoulder joint: Secondary | ICD-10-CM | POA: Diagnosis not present

## 2020-12-15 DIAGNOSIS — I5042 Chronic combined systolic (congestive) and diastolic (congestive) heart failure: Secondary | ICD-10-CM | POA: Diagnosis not present

## 2020-12-17 DIAGNOSIS — M5030 Other cervical disc degeneration, unspecified cervical region: Secondary | ICD-10-CM | POA: Diagnosis not present

## 2020-12-17 DIAGNOSIS — Z96611 Presence of right artificial shoulder joint: Secondary | ICD-10-CM | POA: Diagnosis not present

## 2020-12-17 DIAGNOSIS — Z4789 Encounter for other orthopedic aftercare: Secondary | ICD-10-CM | POA: Diagnosis not present

## 2020-12-17 DIAGNOSIS — F32A Depression, unspecified: Secondary | ICD-10-CM | POA: Diagnosis not present

## 2020-12-17 DIAGNOSIS — I110 Hypertensive heart disease with heart failure: Secondary | ICD-10-CM | POA: Diagnosis not present

## 2020-12-17 DIAGNOSIS — I5042 Chronic combined systolic (congestive) and diastolic (congestive) heart failure: Secondary | ICD-10-CM | POA: Diagnosis not present

## 2020-12-21 ENCOUNTER — Encounter: Payer: Self-pay | Admitting: Family Medicine

## 2020-12-22 ENCOUNTER — Other Ambulatory Visit: Payer: Self-pay | Admitting: Family Medicine

## 2020-12-22 DIAGNOSIS — I5042 Chronic combined systolic (congestive) and diastolic (congestive) heart failure: Secondary | ICD-10-CM | POA: Diagnosis not present

## 2020-12-22 DIAGNOSIS — Z4789 Encounter for other orthopedic aftercare: Secondary | ICD-10-CM | POA: Diagnosis not present

## 2020-12-22 DIAGNOSIS — Z96611 Presence of right artificial shoulder joint: Secondary | ICD-10-CM | POA: Diagnosis not present

## 2020-12-22 DIAGNOSIS — M1A09X0 Idiopathic chronic gout, multiple sites, without tophus (tophi): Secondary | ICD-10-CM

## 2020-12-22 DIAGNOSIS — M5030 Other cervical disc degeneration, unspecified cervical region: Secondary | ICD-10-CM | POA: Diagnosis not present

## 2020-12-22 DIAGNOSIS — I110 Hypertensive heart disease with heart failure: Secondary | ICD-10-CM | POA: Diagnosis not present

## 2020-12-22 DIAGNOSIS — F32A Depression, unspecified: Secondary | ICD-10-CM | POA: Diagnosis not present

## 2020-12-22 MED ORDER — COLCHICINE 0.6 MG PO TABS: 0.6000 mg | ORAL_TABLET | Freq: Every day | ORAL | 2 refills | Status: DC

## 2020-12-22 NOTE — Telephone Encounter (Signed)
Patient calls nurse line stating her physical therapist is suggesting she be on Baclofen.

## 2020-12-24 DIAGNOSIS — I110 Hypertensive heart disease with heart failure: Secondary | ICD-10-CM | POA: Diagnosis not present

## 2020-12-24 DIAGNOSIS — Z96611 Presence of right artificial shoulder joint: Secondary | ICD-10-CM | POA: Diagnosis not present

## 2020-12-24 DIAGNOSIS — I5042 Chronic combined systolic (congestive) and diastolic (congestive) heart failure: Secondary | ICD-10-CM | POA: Diagnosis not present

## 2020-12-24 DIAGNOSIS — M5030 Other cervical disc degeneration, unspecified cervical region: Secondary | ICD-10-CM | POA: Diagnosis not present

## 2020-12-24 DIAGNOSIS — F32A Depression, unspecified: Secondary | ICD-10-CM | POA: Diagnosis not present

## 2020-12-24 DIAGNOSIS — Z4789 Encounter for other orthopedic aftercare: Secondary | ICD-10-CM | POA: Diagnosis not present

## 2020-12-28 ENCOUNTER — Other Ambulatory Visit: Payer: Self-pay

## 2020-12-28 DIAGNOSIS — M5030 Other cervical disc degeneration, unspecified cervical region: Secondary | ICD-10-CM | POA: Diagnosis not present

## 2020-12-28 DIAGNOSIS — Z4789 Encounter for other orthopedic aftercare: Secondary | ICD-10-CM | POA: Diagnosis not present

## 2020-12-28 DIAGNOSIS — I5042 Chronic combined systolic (congestive) and diastolic (congestive) heart failure: Secondary | ICD-10-CM | POA: Diagnosis not present

## 2020-12-28 DIAGNOSIS — G894 Chronic pain syndrome: Secondary | ICD-10-CM

## 2020-12-28 DIAGNOSIS — Z96611 Presence of right artificial shoulder joint: Secondary | ICD-10-CM | POA: Diagnosis not present

## 2020-12-28 DIAGNOSIS — Z96641 Presence of right artificial hip joint: Secondary | ICD-10-CM

## 2020-12-28 DIAGNOSIS — F32A Depression, unspecified: Secondary | ICD-10-CM | POA: Diagnosis not present

## 2020-12-28 DIAGNOSIS — I110 Hypertensive heart disease with heart failure: Secondary | ICD-10-CM | POA: Diagnosis not present

## 2020-12-28 MED ORDER — HYDROCODONE-ACETAMINOPHEN 5-325 MG PO TABS
1.0000 | ORAL_TABLET | Freq: Four times a day (QID) | ORAL | 0 refills | Status: DC | PRN
Start: 2020-12-28 — End: 2021-01-26

## 2020-12-31 DIAGNOSIS — Z96611 Presence of right artificial shoulder joint: Secondary | ICD-10-CM | POA: Diagnosis not present

## 2020-12-31 DIAGNOSIS — F32A Depression, unspecified: Secondary | ICD-10-CM | POA: Diagnosis not present

## 2020-12-31 DIAGNOSIS — I110 Hypertensive heart disease with heart failure: Secondary | ICD-10-CM | POA: Diagnosis not present

## 2020-12-31 DIAGNOSIS — M5030 Other cervical disc degeneration, unspecified cervical region: Secondary | ICD-10-CM | POA: Diagnosis not present

## 2020-12-31 DIAGNOSIS — Z4789 Encounter for other orthopedic aftercare: Secondary | ICD-10-CM | POA: Diagnosis not present

## 2020-12-31 DIAGNOSIS — I5042 Chronic combined systolic (congestive) and diastolic (congestive) heart failure: Secondary | ICD-10-CM | POA: Diagnosis not present

## 2021-01-03 ENCOUNTER — Other Ambulatory Visit: Payer: Self-pay | Admitting: Family Medicine

## 2021-01-05 DIAGNOSIS — I110 Hypertensive heart disease with heart failure: Secondary | ICD-10-CM | POA: Diagnosis not present

## 2021-01-05 DIAGNOSIS — Z96611 Presence of right artificial shoulder joint: Secondary | ICD-10-CM | POA: Diagnosis not present

## 2021-01-05 DIAGNOSIS — I5042 Chronic combined systolic (congestive) and diastolic (congestive) heart failure: Secondary | ICD-10-CM | POA: Diagnosis not present

## 2021-01-05 DIAGNOSIS — F32A Depression, unspecified: Secondary | ICD-10-CM | POA: Diagnosis not present

## 2021-01-05 DIAGNOSIS — Z4789 Encounter for other orthopedic aftercare: Secondary | ICD-10-CM | POA: Diagnosis not present

## 2021-01-05 DIAGNOSIS — M5030 Other cervical disc degeneration, unspecified cervical region: Secondary | ICD-10-CM | POA: Diagnosis not present

## 2021-01-06 NOTE — Progress Notes (Signed)
Donna Herrera Phone: [phone] Subjective:    I'm seeing this patient by the request  of:  Donna Hefty, DO  CC: Shoulder and neck pain follow-up  [id number]   11/26/2020 Patient did complain of some rib pain.  More pain in the axillary area.  Could be secondary to the crutches.  I did not feel any type of mass.  Patient is scheduled to have a mammogram in February she states.  Patient will follow up with primary care provider  Patient has significant arthritic changes of the neck as well as the bilateral shoulders.  Concerned that this is osteoarthritic as well as likely gouty arthropathy.  Discussed with patient in great length.  Patient will start with formal physical therapy.  We will attempt to give patient different crutches that may not put so much pressure on patient's shoulders and neck.  I think if patient is able to transition to other type of modalities to help her with ambulation this may help some of the discomfort and pain.  Patient was given gabapentin by primary care provider and I do think that this is a good medication and continue this at nighttime.  I do not think patient is a candidate for osteopathic manipulation.  Patient will try this conservative therapy and see me again in 6 to 8 weeks.  Appears to be bilateral.  We will get x-rays.  And start with formal physical therapy.  Will try to see if different crutches will be beneficial.  Follow-up again 6 to 8 weeks  Update 01/07/2021 [name] is a 75 y.o. female coming in with complaint of neck, rib and shoulder pain.  Patient was unable to come in today.  We did discuss the virtual visit but had difficulty with the platform any changes to more of a telephone call.  2 identifiers were used at the beginning of the conversation.  Patient was at home alone and I was in my office visit.  Patient states that she is doing significantly  better.  Patient is able to ambulate better with her new walker and using crutches less and less.  Work with physical therapist in house which has been significantly helpful as well.  Patient is very happy with the results.  Xray right shoulder 11/26/2020 IMPRESSION: 1. No acute displaced fracture or dislocation. 2. Possible insufficiency fracture involving the right humeral head, new since prior study. 3. End-stage degenerative changes of the right shoulder.  Xray left shoulder 11/26/2020 IMPRESSION: End-stage degenerative changes of the left glenohumeral joint.    Past Medical History:  Diagnosis Date  . Allergy   . Arthritis   . Candidiasis, mouth 10/19/2017  . Chronic combined systolic and diastolic CHF (congestive heart failure) ([place]) 10/21/2015   Echo 3/16: EF 40%, diffuse HK, mild MR, moderate LAE, mild RAE, PASP 42 mmHg  //  Echo 12/16: Mild LVH, EF 35-40%, diffuse HK, Gr 2 DD, MAC, mild MR, mod LAE, normal RVSF, PASP 49 mmHg // Echo 5/19: EF 15-20, diff HK, Gr 2 DD, trivial AI, MAC, trivial MR, mod LAE, normal RVSF, mild TR, PASP 62 // Limited Echo 8/19: mod LVH, EF 40-45, diff HK, Gr 1 DD, MAC, mild LAE   . Chronic left shoulder pain 04/10/2013  . Chronic lung disease   . Chronic pain   . Chronic pain syndrome 07/20/2017  . Depression 08/25/2016  . DJD (degenerative joint disease) 10/12/2012   Multiple joint replacements  by Dr. Collier Salina.   . Dyshidrotic eczema 12/31/2014  . Essential hypertension 07/20/2017  . GERD (gastroesophageal reflux disease)   . Gout   . H/O slipped capital femoral epiphysis (SCFE)   . History of total right hip replacement 04/02/2019   Followed closely with orthopedist Dr. Alvan Dame. Patient is s/p total right hip replacement in 1980's with evidence of bone loss and osteopenia making reconstruction unlikely. Plan for conservative treatment at this time with crutches for ambulation and pain control.  . Hyperlipidemia   . Hypertension   . Hypertensive heart  disease with CHF (congestive heart failure) ([place]) 10/12/2012  . Loss of taste 02/06/2018  . Mass of breast, left 07/21/2014  . NICM (nonischemic cardiomyopathy) ([place]) 06/09/2016   A. LHC 7/17: Normal coronary arteries, EF 30-35%, LVEDP 14 mmHg  . Pain in gums 04/18/2018  . Pre-diabetes   . Right hip pain 11/02/2012  . Tendonitis, Achilles, left 05/24/2017  . Tobacco abuse 09/29/2014  . Vitamin D deficiency 12/31/2014   Past Surgical History:  Procedure Laterality Date  . CARDIAC CATHETERIZATION  2005   normal-[place]  . CARDIAC CATHETERIZATION N/A 05/11/2016   Procedure: Right/Left Heart Cath and Coronary Angiography;  Surgeon: Peter M Martinique, MD;  Location: [facility];  Service: Cardiovascular;  Laterality: N/A;  . CESAREAN SECTION    . COLONOSCOPY  1990's ??  . HIP FUSION Left    left   . JOINT REPLACEMENT Bilateral 1991, 1997   [facility]  . ROTATOR CUFF REPAIR     bilateral  . TOOTH EXTRACTION N/A 04/03/2020   Procedure: DENTAL RESTORATION/EXTRACTIONS;  Surgeon: [name], DDS;  Location: [facility];  Service: Oral Surgery;  Laterality: N/A;  . [place]   right. Dr. Collier Salina  . TUBAL LIGATION     Social History   Socioeconomic History  . Marital status: Married    Spouse name: Not on file  . Number of children: Not on file  . Years of education: Not on file  . Highest education level: Not on file  Occupational History  . Not on file  Tobacco Use  . Smoking status: Former Smoker    Packs/day: 0.50    Years: 55.00    Pack years: 27.50    Quit date: 2017    Years since quitting: 5.1  . Smokeless tobacco: Never Used  Vaping Use  . Vaping Use: Never used  Substance and Sexual Activity  . Alcohol use: Not Currently    Comment: on holidays  . Drug use: No  . Sexual activity: Not on file  Other Topics Concern  . Not on file  Social History Narrative   Formerly daycare provider. Lives with husband, daughter Donna Herrera and grandkids.   Social  Determinants of Health   Financial Resource Strain: Not on file  Food Insecurity: Not on file  Transportation Needs: Not on file  Physical Activity: Not on file  Stress: Not on file  Social Connections: Not on file   Allergies  Allergen Reactions  . Crab [Shellfish Allergy]     Itching to lips  . Morphine And Related Itching and Swelling   Family History  Problem Relation Age of Onset  . Depression Mother   . Diabetes Mother   . Hypertension Mother   . Stroke Mother   . Heart disease Mother   . Cancer Father        unknown primary  . Diabetes Sister   . Stomach cancer Brother   .  Throat cancer Son   . Breast cancer Neg Hx   . Esophageal cancer Neg Hx   . Colon cancer Neg Hx   . Colon polyps Neg Hx      Current Outpatient Medications (Cardiovascular):  .  atorvastatin (LIPITOR) 40 MG tablet, Take 1 tablet (40 mg total) by mouth daily. .  carvedilol (COREG) 3.125 MG tablet, Take 1 tablet (3.125 mg total) by mouth 2 (two) times daily. .  furosemide (LASIX) 20 MG tablet, TAKE 20 MG TABLET BY MOUTH ON Monday, WED, FRI .  hydrALAZINE (APRESOLINE) 10 MG tablet, Take 2 tablets (20 mg total) by mouth 3 (three) times daily. [name]  spironolactone (ALDACTONE) 25 MG tablet, Take 0.5 tablets (12.5 mg total) by mouth daily.  Current Outpatient Medications (Respiratory):  .  albuterol (PROVENTIL HFA;VENTOLIN HFA) 108 (90 Base) MCG/ACT inhaler, Inhale 1-2 puffs into the lungs every 6 (six) hours as needed for wheezing.  Current Outpatient Medications (Analgesics):  .  allopurinol (ZYLOPRIM) 100 MG tablet, Take 100 mg by mouth daily. [name]  aspirin 81 MG tablet, Take 1 tablet (81 mg total) by mouth daily. .  colchicine 0.6 MG tablet, Take 1 tablet (0.6 mg total) by mouth daily. [name]  HYDROcodone-acetaminophen (NORCO/VICODIN) 5-325 MG tablet, Take 1 tablet by mouth every 6 (six) hours as needed for moderate pain.   Current Outpatient Medications (Other):  .  baclofen (LIORESAL) 10 MG tablet,  TAKE 1 TABLET BY MOUTH THREE TIMES A DAY .  Blood Glucose Monitoring Suppl (ONETOUCH VERIO) w/Device KIT, Please use to check blood sugar once daily. E11.9 .  gabapentin (NEURONTIN) 100 MG capsule, Take 2 capsules (200 mg total) by mouth at bedtime. [name]  glucose blood (ONETOUCH VERIO) test strip, Please use to check blood sugar once daily. E11.9 .  Lancet Devices (ONE TOUCH DELICA LANCING DEV) MISC, Please use to check blood sugar once daily. E11.9 .  OneTouch Delica Lancets 93A MISC, Please use to check blood sugar once daily. E11.9 .  Potassium Chloride ER 20 MEQ TBCR, TAKE 1 TABLET BY MOUTH AS NEEDED (WHEN TAKING LASIX). [name]  triamcinolone ointment (KENALOG) 0.5 %, Apply 1 application topically 2 (two) times daily.   Reviewed prior external information including notes and imaging from  primary care provider As well as notes that were available from care everywhere and other healthcare systems.  Past medical history, social, surgical and family history all reviewed in electronic medical record.  No pertanent information unless stated regarding to the chief complaint.      Impression and Recommendations:    Degenerative disc disease, cervical Patient is doing significantly better at this time.  No longer using the crutches and using more of a walker which is significantly beneficial for the shoulders and the neck.  At this point we will make no significant changes.  Patient can call if she needs me.  Rotator cuff arthropathy of both shoulders Rotator cuff arthropathy of both shoulders.  We have augmented an outpatient more of a rolling walker as well as different crutches which has made significant benefit for her at this time.  Patient is able to ambulate better secondary to this.  Less pain of the shoulders and the neck.  Patient is encouraged to continue to do the exercises 2-3 times a week as maintenance.  Follow-up with me as needed   The above documentation has been reviewed and is  accurate and complete Lyndal Pulley, DO  Total time reviewing patient's imaging, previous notes and discussing  with patient 13 minutes.

## 2021-01-07 ENCOUNTER — Ambulatory Visit (INDEPENDENT_AMBULATORY_CARE_PROVIDER_SITE_OTHER): Payer: Medicare Other | Admitting: Family Medicine

## 2021-01-07 ENCOUNTER — Other Ambulatory Visit: Payer: Self-pay

## 2021-01-07 ENCOUNTER — Encounter: Payer: Self-pay | Admitting: Family Medicine

## 2021-01-07 DIAGNOSIS — M12811 Other specific arthropathies, not elsewhere classified, right shoulder: Secondary | ICD-10-CM | POA: Diagnosis not present

## 2021-01-07 DIAGNOSIS — M12812 Other specific arthropathies, not elsewhere classified, left shoulder: Secondary | ICD-10-CM

## 2021-01-07 DIAGNOSIS — M5030 Other cervical disc degeneration, unspecified cervical region: Secondary | ICD-10-CM

## 2021-01-07 NOTE — Assessment & Plan Note (Signed)
Patient is doing significantly better at this time.  No longer using the crutches and using more of a walker which is significantly beneficial for the shoulders and the neck.  At this point we will make no significant changes.  Patient can call if she needs me.

## 2021-01-07 NOTE — Assessment & Plan Note (Signed)
Rotator cuff arthropathy of both shoulders.  We have augmented an outpatient more of a rolling walker as well as different crutches which has made significant benefit for her at this time.  Patient is able to ambulate better secondary to this.  Less pain of the shoulders and the neck.  Patient is encouraged to continue to do the exercises 2-3 times a week as maintenance.  Follow-up with me as needed

## 2021-01-08 ENCOUNTER — Telehealth: Payer: Self-pay

## 2021-01-08 DIAGNOSIS — F32A Depression, unspecified: Secondary | ICD-10-CM | POA: Diagnosis not present

## 2021-01-08 DIAGNOSIS — Z4789 Encounter for other orthopedic aftercare: Secondary | ICD-10-CM | POA: Diagnosis not present

## 2021-01-08 DIAGNOSIS — I5042 Chronic combined systolic (congestive) and diastolic (congestive) heart failure: Secondary | ICD-10-CM | POA: Diagnosis not present

## 2021-01-08 DIAGNOSIS — Z96611 Presence of right artificial shoulder joint: Secondary | ICD-10-CM | POA: Diagnosis not present

## 2021-01-08 DIAGNOSIS — M5030 Other cervical disc degeneration, unspecified cervical region: Secondary | ICD-10-CM | POA: Diagnosis not present

## 2021-01-08 DIAGNOSIS — I110 Hypertensive heart disease with heart failure: Secondary | ICD-10-CM | POA: Diagnosis not present

## 2021-01-08 NOTE — Telephone Encounter (Signed)
Patient called stating she is having problem with her right hip 10/10 pain that only happens so often but when it does it is unbearable, wanting to talk about the options she has to help with those days and wanted to talk about diabetic shoes with orthopedic/arch support. Patient therapist also got on the phone to talk about patients pain and thinking that the orthopedic shoes could possibly really help her.   I informed patient a therapist Dr. Tamala Julian is out of the office today and if the pain is that bad urgent care would be the best place to go and get some relief of pain. As far as the orthopedic shoes I stated I would send a message directly to Dr. Tamala Julian.

## 2021-01-11 ENCOUNTER — Other Ambulatory Visit: Payer: Self-pay

## 2021-01-11 DIAGNOSIS — Z4789 Encounter for other orthopedic aftercare: Secondary | ICD-10-CM | POA: Diagnosis not present

## 2021-01-11 DIAGNOSIS — M5030 Other cervical disc degeneration, unspecified cervical region: Secondary | ICD-10-CM | POA: Diagnosis not present

## 2021-01-11 DIAGNOSIS — I5042 Chronic combined systolic (congestive) and diastolic (congestive) heart failure: Secondary | ICD-10-CM | POA: Diagnosis not present

## 2021-01-11 DIAGNOSIS — I110 Hypertensive heart disease with heart failure: Secondary | ICD-10-CM | POA: Diagnosis not present

## 2021-01-11 DIAGNOSIS — F32A Depression, unspecified: Secondary | ICD-10-CM | POA: Diagnosis not present

## 2021-01-11 DIAGNOSIS — Z96611 Presence of right artificial shoulder joint: Secondary | ICD-10-CM | POA: Diagnosis not present

## 2021-01-11 MED ORDER — AMBULATORY NON FORMULARY MEDICATION: 1.0000 [IU] | Freq: Once | 0 refills | Status: AC

## 2021-01-11 NOTE — Telephone Encounter (Signed)
Can you call and talk to patient she either wanted to talk to [facility], you, or Kana

## 2021-01-11 NOTE — Telephone Encounter (Signed)
We can send a referral either to podiatry or try a medical supply store if she wants.  Yes I agree if the pain in the hip is worse she will need to be seen. My schedule is full so Dr. Georgina Snell or if very severe need urgent care or ER

## 2021-01-12 DIAGNOSIS — I5042 Chronic combined systolic (congestive) and diastolic (congestive) heart failure: Secondary | ICD-10-CM | POA: Diagnosis not present

## 2021-01-12 DIAGNOSIS — I110 Hypertensive heart disease with heart failure: Secondary | ICD-10-CM | POA: Diagnosis not present

## 2021-01-12 DIAGNOSIS — Z96611 Presence of right artificial shoulder joint: Secondary | ICD-10-CM | POA: Diagnosis not present

## 2021-01-12 DIAGNOSIS — F32A Depression, unspecified: Secondary | ICD-10-CM | POA: Diagnosis not present

## 2021-01-12 DIAGNOSIS — M5030 Other cervical disc degeneration, unspecified cervical region: Secondary | ICD-10-CM | POA: Diagnosis not present

## 2021-01-12 DIAGNOSIS — Z4789 Encounter for other orthopedic aftercare: Secondary | ICD-10-CM | POA: Diagnosis not present

## 2021-01-13 ENCOUNTER — Telehealth: Payer: Self-pay | Admitting: Pharmacist

## 2021-01-13 NOTE — Telephone Encounter (Signed)
Called pt to follow up with home BP readings and increase hydralazine dose if needed. No answer, voicemail full. Will try again later.

## 2021-01-15 DIAGNOSIS — I110 Hypertensive heart disease with heart failure: Secondary | ICD-10-CM | POA: Diagnosis not present

## 2021-01-15 DIAGNOSIS — Z96611 Presence of right artificial shoulder joint: Secondary | ICD-10-CM | POA: Diagnosis not present

## 2021-01-15 DIAGNOSIS — I5042 Chronic combined systolic (congestive) and diastolic (congestive) heart failure: Secondary | ICD-10-CM | POA: Diagnosis not present

## 2021-01-15 DIAGNOSIS — F32A Depression, unspecified: Secondary | ICD-10-CM | POA: Diagnosis not present

## 2021-01-15 DIAGNOSIS — Z4789 Encounter for other orthopedic aftercare: Secondary | ICD-10-CM | POA: Diagnosis not present

## 2021-01-15 DIAGNOSIS — M5030 Other cervical disc degeneration, unspecified cervical region: Secondary | ICD-10-CM | POA: Diagnosis not present

## 2021-01-15 NOTE — Telephone Encounter (Signed)
Called pt, she states home BP readings have improved. Recalls systolic BPs of 597, 416, 127, 140. States majority of readings show systolic BP < 384. Advised pt to continue current meds and to call clinic if readings consistently increase above 130 as we could further titrate her hydralazine if needed.

## 2021-01-19 ENCOUNTER — Ambulatory Visit: Payer: Medicare Other | Admitting: Podiatry

## 2021-01-22 DIAGNOSIS — I110 Hypertensive heart disease with heart failure: Secondary | ICD-10-CM | POA: Diagnosis not present

## 2021-01-22 DIAGNOSIS — Z4789 Encounter for other orthopedic aftercare: Secondary | ICD-10-CM | POA: Diagnosis not present

## 2021-01-22 DIAGNOSIS — I5042 Chronic combined systolic (congestive) and diastolic (congestive) heart failure: Secondary | ICD-10-CM | POA: Diagnosis not present

## 2021-01-22 DIAGNOSIS — F32A Depression, unspecified: Secondary | ICD-10-CM | POA: Diagnosis not present

## 2021-01-22 DIAGNOSIS — M5030 Other cervical disc degeneration, unspecified cervical region: Secondary | ICD-10-CM | POA: Diagnosis not present

## 2021-01-22 DIAGNOSIS — Z96611 Presence of right artificial shoulder joint: Secondary | ICD-10-CM | POA: Diagnosis not present

## 2021-01-26 ENCOUNTER — Other Ambulatory Visit: Payer: Self-pay

## 2021-01-26 DIAGNOSIS — Z96641 Presence of right artificial hip joint: Secondary | ICD-10-CM

## 2021-01-26 DIAGNOSIS — G894 Chronic pain syndrome: Secondary | ICD-10-CM

## 2021-01-26 MED ORDER — HYDROCODONE-ACETAMINOPHEN 5-325 MG PO TABS: 1.0000 | ORAL_TABLET | Freq: Four times a day (QID) | ORAL | 0 refills | Status: DC | PRN

## 2021-01-29 ENCOUNTER — Telehealth: Payer: Self-pay

## 2021-01-29 DIAGNOSIS — Z96641 Presence of right artificial hip joint: Secondary | ICD-10-CM

## 2021-01-29 DIAGNOSIS — G894 Chronic pain syndrome: Secondary | ICD-10-CM

## 2021-01-29 MED ORDER — HYDROCODONE-ACETAMINOPHEN 5-325 MG PO TABS: 1.0000 | ORAL_TABLET | Freq: Four times a day (QID) | ORAL | 0 refills | Status: DC | PRN

## 2021-01-29 NOTE — Telephone Encounter (Signed)
PMP reviewed, appropriate. Needs appointment with PCP.   Dorris Singh, MD  Family Medicine Teaching Service

## 2021-01-29 NOTE — Telephone Encounter (Signed)
Patient calls nurse line reporting the pharmacy still does not have her pain medication. I called the pharmacy as PCP sent this in on 3/29. Pharmacy reports they have no record of this. Will forward to PCP and Preceptor for this afternoon as patient is completely out and I can not give a verbal.

## 2021-01-29 NOTE — Addendum Note (Signed)
Addended by: Owens Shark, Tazaria Dlugosz on: 01/29/2021 06:46 PM   Modules accepted: Orders

## 2021-01-31 NOTE — Progress Notes (Deleted)
    SUBJECTIVE:   Chief compliant/HPI: annual examination  Janus Vlcek is a 75 y.o. who presents today for an annual exam.   PMH: NISCM, HTN, CHF (EF 40-45%), HTN, chronic lung disease, GERD, degenerative disc disease, rotator cuff arthropathy, adenomatous polyp, depression, chronic pain, gout, h/o tobacco abuse, HLD, prediabetes, tension headache, vitamin d deficiency  Social History: Alcohol: Tobacco:  Illicit Drugs: *** Safe at home: *** Depression/Suicidality: *** Exercise: ***  Health Maintenance: There are no preventive care reminders to display for this patient.   Review of systems form reviewed and notable for ***.   OBJECTIVE:   There were no vitals taken for this visit.  General: pleasant ***, sitting comfortably in exam chair, well nourished, well developed, in no acute distress with non-toxic appearance HEENT: normocephalic, atraumatic, moist mucous membranes, oropharynx clear without erythema or exudate, TM normal bilaterally  Neck: supple, non-tender without lymphadenopathy CV: regular rate and rhythm without murmurs, rubs, or gallops, no lower extremity edema, 2+ radial and pedal pulses bilaterally Lungs: clear to auscultation bilaterally with normal work of breathing on room air Resp: breathing comfortably on room air, speaking in full sentences Abdomen: soft, non-tender, non-distended, no masses or organomegaly palpable, normoactive bowel sounds Skin: warm, dry, no rashes or lesions Extremities: warm and well perfused, normal tone MSK: ROM grossly intact, strength intact, gait normal Neuro: Alert and oriented, speech normal  ASSESSMENT/PLAN:   Annual Physical Exam: Patient here today for annual physical exam.  PMH, surgical history, and social history were reviewed. The following concerns below were discussed.   No problem-specific Assessment & Plan notes found for this encounter.    Annual Examination  See AVS for age appropriate  recommendations  PHQ score ***, reviewed and discussed.  BP reviewed and at goal ***.  Asked about intimate partner violence and resources given as appropriate  Advance directives discussion ***  Considered the following items based upon USPSTF recommendations: Diabetes screening: ordered Screening for elevated cholesterol: ordered  DEXA ordered.  Reviewed risk factors for latent tuberculosis and not indicated  Cervical cancer screening: No longer recommended Breast cancer screening: {mammoscreen:23820} Colorectal cancer screening: No longer recommended per GI/last colonscopy in 2021 Lung cancer screening: {discussed/declined/written BVQX:45038}. See documentation below regarding indications/risks/benefits.  Vaccinations ***.   Follow up in 1 *** year or sooner if indicated.    [place]

## 2021-02-02 ENCOUNTER — Ambulatory Visit: Payer: Medicare Other | Admitting: Family Medicine

## 2021-02-07 NOTE — Progress Notes (Signed)
SUBJECTIVE:   Chief compliant/HPI: annual examination  Donna Herrera is a 75 y.o. who presents today for an annual exam.   PMH: NISCM, HTN, CHF (EF 40-45%), HTN, chronic lung disease, GERD, degenerative disc disease, rotator cuff arthropathy, adenomatous polyp, depression, chronic pain, gout, h/o tobacco abuse, HLD, prediabetes, tension headache, vitamin d deficiency  Current Concerns:  Dry eyes bilaterally x months Large nodule on mid forehead x years. Denies pain. Slow growing over time. Would like it to be treated.  Social History: Alcohol: no  Tobacco: no Illicit Drugs: no Safe at home: yes Depression/Suicidality: no  Health Maintenance: Due for Bone density scan  ROS: see HPI  OBJECTIVE:   BP 138/70   Pulse (!) 58   Ht 5\' 8"  (1.727 m)   Wt 186 lb 6 oz (84.5 kg)   SpO2 97%   BMI 28.34 kg/m   General: pleasant older woman, standing alongside exam bed with family member at side, well nourished, well developed, in no acute distress with non-toxic appearance CV: regular rate and rhythm without murmurs, rubs, or gallops, trace LE on left, minimal on right,, 2+ radial and pedal pulses bilaterally Lungs: clear to auscultation bilaterally with normal work of breathing on room air, speaking in full sentences Extremities: warm and well perfused, normal tone, pain along left great toe MSK:  gait antalgic using crutches today for visit Neuro: Alert and oriented, speech normal Skin: large soft nodule at glabella/root of nose with central black head         Depression screen [facility] 2/9 02/09/2021 08/19/2020 07/21/2020  Decreased Interest 0 0 0  Down, Depressed, Hopeless 0 1 0  PHQ - 2 Score 0 1 0  Altered sleeping 0 1 0  Tired, decreased energy 0 1 0  Change in appetite 0 1 0  Feeling bad or failure about yourself  0 3 0  Trouble concentrating 0 0 0  Moving slowly or fidgety/restless 0 1 0  Suicidal thoughts 0 0 0  PHQ-9 Score 0 8 0  Some recent data might  be hidden     ASSESSMENT/PLAN:   Annual Physical Exam: Patient here today for annual physical exam.  PMH, surgical history, and social history were reviewed. The following concerns below were discussed.   Essential hypertension Chronic. Slightly above goal today (Goal <130/80). Per chart review,  Cardiology considering increasing hydralazine.  Will attempt to reach out to NP to touch base regarding this.  Will not make any changes at this time Continue Hydralazine 10mg  TID, Spironolactone 12.5mg  QD, Coreg 3.125mg  BID, and Furosemide/kdur TID.  Patient to notify cards or PCP if persistently >130/80   Chronic combined systolic and diastolic CHF (congestive heart failure) (HCC) Chronic. Euvolemic on exam today. - continue Carvedilol 3.125mg  BID, Hydralazine 20mg  TID, Spironolactone 12.5mg  QD, and Furosemide 20mg  TIW with K-dur. Follows with cardiology. Last appointment in Feb 2022.   History of tobacco abuse Has 27.5 pack year smoking history. Quit smoking 2017. Does qualify for lung cancer screen at this time. Low dose CT scan ordered and scheduled.  Hyperlipidemia Chronic. Currently on Lipitor 40mg  QD. - continue statin - lipid panel today to monitor  Encounter for chronic pain management Chronic. Has pain secondary to significant DJD, cervical DDD, and bilateral rotator cuff arthropathy. She has been following with sports medicine, Dr. Hulan Saas. Transitioned off of crutches to rolling walker which she uses persistently at home. Notes using crutches when going out. Pain has significantly improved with this transition.  Current pain regimen includes Hydrocodone-Acetamenophen 5-325mg : 1-2 PO q6h PRN (#30), baclofen 10mg  TID PRN, gabapentin 200mg  qHS. She denies excessive sleepiness, weakness, dizziness, or falls. Continue current pain medications given this allows her to maintain ADLs. Cautioned with use given number of sedative medications. She voiced understanding and agreement  with plan.   Vitamin D deficiency Chronic. Last Vitamin D level 15.4 in 08/2020. Currently taking 1000IU daily. - continue Vit D3 1000IU daily with Calcium - Vitamin D level obtained today to monitor. Will adjust treatment as indicated  Prediabetes A1C 6.0 today.  Continue lifestyle modifications.   Gout History of gout with recent flare in left great toe. Treated with Colchicine with improvment in symptoms. Recommended to start allopurinol to help with prevention. At follow up, would consider reevaluation of left foot and possible xray to evaluate for hallux rigidis which could mimic gout but require alternative treatment.   Open comedone Chronic. Large nodule at glabella/root of nose with central black head. Appears most consistent with large open comedone. Due to number of other chronic conditions discussed today, opted to defer this for follow up appointment. Given location and concern for cosmesis, discussed derm referral however patient was not concerned about cosmetics and opted for derm appt at [facility]. Appt scheduled.   Annual Examination  See AVS for age appropriate recommendations  PHQ score 0, reviewed and discussed.   Considered the following items based upon USPSTF recommendations: Diabetes screening: ordered Screening for elevated cholesterol: ordered  DEXA ordered. Advised to call and scheduled. Reviewed risk factors for latent tuberculosis and not indicated  Cervical cancer screening: No longer recommended Breast cancer screening: No longer recommended based on age.   Colorectal cancer screening: No longer recommended per GI/last colonscopy in 2021 Lung cancer screening: ordered. See documentation below regarding indications/risks/benefits.  Vaccinations: Up to date   Follow up in 3 months or sooner if indicated.    [place]

## 2021-02-09 ENCOUNTER — Other Ambulatory Visit: Payer: Self-pay

## 2021-02-09 ENCOUNTER — Encounter: Payer: Self-pay | Admitting: Family Medicine

## 2021-02-09 ENCOUNTER — Ambulatory Visit (INDEPENDENT_AMBULATORY_CARE_PROVIDER_SITE_OTHER): Payer: Medicare Other | Admitting: Family Medicine

## 2021-02-09 VITALS — BP 138/70 | HR 58 | Ht 68.0 in | Wt 186.4 lb

## 2021-02-09 DIAGNOSIS — Z0000 Encounter for general adult medical examination without abnormal findings: Secondary | ICD-10-CM | POA: Diagnosis not present

## 2021-02-09 DIAGNOSIS — I10 Essential (primary) hypertension: Secondary | ICD-10-CM

## 2021-02-09 DIAGNOSIS — E785 Hyperlipidemia, unspecified: Secondary | ICD-10-CM | POA: Diagnosis not present

## 2021-02-09 DIAGNOSIS — L700 Acne vulgaris: Secondary | ICD-10-CM

## 2021-02-09 DIAGNOSIS — Z87891 Personal history of nicotine dependence: Secondary | ICD-10-CM | POA: Diagnosis not present

## 2021-02-09 DIAGNOSIS — E559 Vitamin D deficiency, unspecified: Secondary | ICD-10-CM | POA: Diagnosis not present

## 2021-02-09 DIAGNOSIS — E119 Type 2 diabetes mellitus without complications: Secondary | ICD-10-CM | POA: Diagnosis not present

## 2021-02-09 DIAGNOSIS — G8929 Other chronic pain: Secondary | ICD-10-CM

## 2021-02-09 DIAGNOSIS — M1A09X0 Idiopathic chronic gout, multiple sites, without tophus (tophi): Secondary | ICD-10-CM | POA: Diagnosis not present

## 2021-02-09 DIAGNOSIS — R7303 Prediabetes: Secondary | ICD-10-CM

## 2021-02-09 DIAGNOSIS — I5042 Chronic combined systolic (congestive) and diastolic (congestive) heart failure: Secondary | ICD-10-CM | POA: Diagnosis not present

## 2021-02-09 LAB — POCT GLYCOSYLATED HEMOGLOBIN (HGB A1C): HbA1c, POC (controlled diabetic range): 6 % (ref 0.0–7.0)

## 2021-02-09 NOTE — Patient Instructions (Signed)
It was a pleasure to see you today!  Thank you for choosing [facility] for your primary care.   Our plans for today were:  Start the Allopurinol with the Colchicine   Call cardiology for further evaluation of the chest cramp  Continue your pain meds as needed    To keep you healthy, please keep in mind the following health maintenance items that you are due for:   1. Call to schedule the bone density scan to evaluate for osteoporosis 2. Expect a call from our office regarding scheduling the CT lung cancer screen   We are checking some labs today, I will call you if they are abnormal will send you a MyChart message or a letter if they are normal.  If you do not hear about your labs in the next 2 weeks please let us know.  BRING ALL OF YOUR MEDICATIONS WITH YOU TO EVERY VISIT  Follow up in 6 months.   Best Wishes,   Mina Marble, DO

## 2021-02-11 DIAGNOSIS — L700 Acne vulgaris: Secondary | ICD-10-CM | POA: Insufficient documentation

## 2021-02-11 NOTE — Assessment & Plan Note (Signed)
Chronic. Last Vitamin D level 15.4 in 08/2020. Currently taking 1000IU daily. - continue Vit D3 1000IU daily with Calcium - Vitamin D level obtained today to monitor. Will adjust treatment as indicated

## 2021-02-11 NOTE — Assessment & Plan Note (Signed)
A1C 6.0 today.  Continue lifestyle modifications.

## 2021-02-11 NOTE — Assessment & Plan Note (Signed)
Has 27.5 pack year smoking history. Quit smoking 2017. Does qualify for lung cancer screen at this time. Low dose CT scan ordered and scheduled.

## 2021-02-11 NOTE — Assessment & Plan Note (Addendum)
Chronic. Slightly above goal today (Goal <130/80). Per chart review,  Cardiology considering increasing hydralazine.  Will attempt to reach out to NP to touch base regarding this.  Will not make any changes at this time Continue Hydralazine 10mg  TID, Spironolactone 12.5mg  QD, Coreg 3.125mg  BID, and Furosemide/kdur TID.  Patient to notify cards or PCP if persistently >130/80

## 2021-02-11 NOTE — Assessment & Plan Note (Signed)
Chronic. Currently on Lipitor 40mg  QD. - continue statin - lipid panel today to monitor

## 2021-02-11 NOTE — Assessment & Plan Note (Signed)
History of gout with recent flare in left great toe. Treated with Colchicine with improvment in symptoms. Recommended to start allopurinol to help with prevention. At follow up, would consider reevaluation of left foot and possible xray to evaluate for hallux rigidis which could mimic gout but require alternative treatment.

## 2021-02-11 NOTE — Assessment & Plan Note (Signed)
Chronic. Large nodule at glabella/root of nose with central black head. Appears most consistent with large open comedone. Due to number of other chronic conditions discussed today, opted to defer this for follow up appointment. Given location and concern for cosmesis, discussed derm referral however patient was not concerned about cosmetics and opted for derm appt at [facility]. Appt scheduled.

## 2021-02-11 NOTE — Assessment & Plan Note (Signed)
Chronic. Euvolemic on exam today. - continue Carvedilol 3.125mg  BID, Hydralazine 20mg  TID, Spironolactone 12.5mg  QD, and Furosemide 20mg  TIW with K-dur. Follows with cardiology. Last appointment in Feb 2022.

## 2021-02-11 NOTE — Assessment & Plan Note (Signed)
Chronic. Has pain secondary to significant DJD, cervical DDD, and bilateral rotator cuff arthropathy. She has been following with sports medicine, Dr. Hulan Saas. Transitioned off of crutches to rolling walker which she uses persistently at home. Notes using crutches when going out. Pain has significantly improved with this transition.  Current pain regimen includes Hydrocodone-Acetamenophen 5-325mg : 1-2 PO q6h PRN (#30), baclofen 10mg  TID PRN, gabapentin 200mg  qHS. She denies excessive sleepiness, weakness, dizziness, or falls. Continue current pain medications given this allows her to maintain ADLs. Cautioned with use given number of sedative medications. She voiced understanding and agreement with plan.

## 2021-02-12 ENCOUNTER — Telehealth: Payer: Self-pay | Admitting: Pharmacist

## 2021-02-12 LAB — COMPREHENSIVE METABOLIC PANEL WITH GFR
ALT: 14 IU/L (ref 0–32)
AST: 19 IU/L (ref 0–40)
Albumin/Globulin Ratio: 1.3 (ref 1.2–2.2)
Albumin: 4.3 g/dL (ref 3.7–4.7)
Alkaline Phosphatase: 124 IU/L — ABNORMAL HIGH (ref 44–121)
BUN/Creatinine Ratio: 12 (ref 12–28)
BUN: 10 mg/dL (ref 8–27)
Bilirubin Total: 0.3 mg/dL (ref 0.0–1.2)
CO2: 18 mmol/L — ABNORMAL LOW (ref 20–29)
Calcium: 9.5 mg/dL (ref 8.7–10.3)
Chloride: 102 mmol/L (ref 96–106)
Creatinine, Ser: 0.82 mg/dL (ref 0.57–1.00)
Globulin, Total: 3.3 g/dL (ref 1.5–4.5)
Glucose: 93 mg/dL (ref 65–99)
Potassium: 4.4 mmol/L (ref 3.5–5.2)
Sodium: 141 mmol/L (ref 134–144)
Total Protein: 7.6 g/dL (ref 6.0–8.5)
eGFR: 75 mL/min/1.73

## 2021-02-12 LAB — LIPID PANEL
Chol/HDL Ratio: 4.1 ratio (ref 0.0–4.4)
Cholesterol, Total: 243 mg/dL — ABNORMAL HIGH (ref 100–199)
HDL: 60 mg/dL (ref >39)
LDL Chol Calc (NIH): 163 mg/dL — ABNORMAL HIGH (ref 0–99)
Triglycerides: 114 mg/dL (ref 0–149)
VLDL Cholesterol Cal: 20 mg/dL (ref 5–40)

## 2021-02-12 LAB — VITAMIN D 25 HYDROXY (VIT D DEFICIENCY, FRACTURES): Vit D, 25-Hydroxy: 15.2 ng/mL — ABNORMAL LOW (ref 30.0–100.0)

## 2021-02-12 MED ORDER — HYDRALAZINE HCL 50 MG PO TABS: 50.0000 mg | ORAL_TABLET | Freq: Three times a day (TID) | ORAL | 11 refills | Status: DC

## 2021-02-12 NOTE — Telephone Encounter (Signed)
Called pt to follow up with BP and cholesterol. Reports home SBP 130-140, tolerating meds well. Missing 2nd dose of hydralazine a few days a week. Will increase hydralazine from 20mg  to 50mg  TID. Advised pt to monitor BP.  Also inquired about her atorvastatin as her most recent lipid panel shows no improvement in LDL. She has atorvastatin 40mg  tablets at home but has not been taking it because she did not know what it was for. We had a similar conversation during her Feb appt with me where she brought all of her meds to her visit and I wrote down the indication on both her pill bottles and her med list and advised her to start atorvastatin. I have again advised her to start this med. She is primary prevention but her 10 year ASCVD risk is > 20% - LDL should be < 100 and ideally < 70 using newer DCRM recommendations.  Scheduled pt for follow up in 1 month to reassess BP in office. Also advised her to bring in all of her medications again, will complete another full med review/education to help improve compliance.

## 2021-02-14 ENCOUNTER — Encounter (HOSPITAL_COMMUNITY): Payer: Self-pay

## 2021-02-14 ENCOUNTER — Ambulatory Visit (INDEPENDENT_AMBULATORY_CARE_PROVIDER_SITE_OTHER)
Admission: EM | Admit: 2021-02-14 | Discharge: 2021-02-14 | Disposition: A | Discharge status: Home / Self Care | Payer: Medicare Other | Source: Home / Self Care

## 2021-02-14 ENCOUNTER — Emergency Department (HOSPITAL_COMMUNITY)
Admission: EM | Admit: 2021-02-14 | Discharge: 2021-02-14 | Disposition: A | Discharge status: Home / Self Care | Payer: Medicare Other | Attending: Emergency Medicine | Admitting: Emergency Medicine

## 2021-02-14 ENCOUNTER — Emergency Department (HOSPITAL_COMMUNITY): Payer: Medicare Other

## 2021-02-14 ENCOUNTER — Other Ambulatory Visit: Payer: Self-pay

## 2021-02-14 ENCOUNTER — Encounter (HOSPITAL_COMMUNITY): Payer: Self-pay | Admitting: Emergency Medicine

## 2021-02-14 DIAGNOSIS — R519 Headache, unspecified: Secondary | ICD-10-CM | POA: Insufficient documentation

## 2021-02-14 DIAGNOSIS — R1011 Right upper quadrant pain: Secondary | ICD-10-CM

## 2021-02-14 DIAGNOSIS — J029 Acute pharyngitis, unspecified: Secondary | ICD-10-CM | POA: Diagnosis not present

## 2021-02-14 DIAGNOSIS — Z20822 Contact with and (suspected) exposure to covid-19: Secondary | ICD-10-CM | POA: Insufficient documentation

## 2021-02-14 DIAGNOSIS — Z79899 Other long term (current) drug therapy: Secondary | ICD-10-CM | POA: Diagnosis not present

## 2021-02-14 DIAGNOSIS — N3000 Acute cystitis without hematuria: Secondary | ICD-10-CM | POA: Diagnosis not present

## 2021-02-14 DIAGNOSIS — I110 Hypertensive heart disease with heart failure: Secondary | ICD-10-CM | POA: Insufficient documentation

## 2021-02-14 DIAGNOSIS — Z87891 Personal history of nicotine dependence: Secondary | ICD-10-CM | POA: Insufficient documentation

## 2021-02-14 DIAGNOSIS — Z789 Other specified health status: Secondary | ICD-10-CM

## 2021-02-14 DIAGNOSIS — J069 Acute upper respiratory infection, unspecified: Secondary | ICD-10-CM | POA: Insufficient documentation

## 2021-02-14 DIAGNOSIS — I5042 Chronic combined systolic (congestive) and diastolic (congestive) heart failure: Secondary | ICD-10-CM | POA: Insufficient documentation

## 2021-02-14 DIAGNOSIS — Z96641 Presence of right artificial hip joint: Secondary | ICD-10-CM | POA: Diagnosis not present

## 2021-02-14 DIAGNOSIS — B9689 Other specified bacterial agents as the cause of diseases classified elsewhere: Secondary | ICD-10-CM | POA: Insufficient documentation

## 2021-02-14 DIAGNOSIS — Z7982 Long term (current) use of aspirin: Secondary | ICD-10-CM | POA: Insufficient documentation

## 2021-02-14 DIAGNOSIS — R059 Cough, unspecified: Secondary | ICD-10-CM | POA: Insufficient documentation

## 2021-02-14 LAB — URINALYSIS, ROUTINE W REFLEX MICROSCOPIC
Bilirubin Urine: NEGATIVE
Glucose, UA: NEGATIVE mg/dL
Ketones, ur: 5 mg/dL — AB
Nitrite: POSITIVE — AB
Protein, ur: NEGATIVE mg/dL
Specific Gravity, Urine: 1.004 — ABNORMAL LOW (ref 1.005–1.030)
WBC, UA: >50 WBC/hpf — ABNORMAL HIGH (ref 0–5)
pH: 6 (ref 5.0–8.0)

## 2021-02-14 LAB — RESP PANEL BY RT-PCR (FLU A&B, COVID) ARPGX2
Influenza A by PCR: NEGATIVE
Influenza B by PCR: NEGATIVE
SARS Coronavirus 2 by RT PCR: NEGATIVE

## 2021-02-14 LAB — SARS CORONAVIRUS 2 (TAT 6-24 HRS): SARS Coronavirus 2: NEGATIVE

## 2021-02-14 MED ORDER — CEPHALEXIN 500 MG PO CAPS: 500.0000 mg | ORAL_CAPSULE | Freq: Four times a day (QID) | ORAL | 0 refills | Status: DC

## 2021-02-14 MED ORDER — CEPHALEXIN 500 MG PO CAPS
500.0000 mg | ORAL_CAPSULE | Freq: Once | ORAL | Status: AC
  Administered 2021-02-14: 500 mg via ORAL
  Filled 2021-02-14: qty 1

## 2021-02-14 NOTE — Discharge Instructions (Signed)
Work-up for the upper respiratory infection negative for Covid or influenza.  Incidental finding of urinary tract infection.  Take the antibiotic Keflex as directed for the next 7 days.  Return for any new or worse symptoms.  Your symptoms could be allergy related as your initial concern.  Okay to take over-the-counter Zyrtec or Claritin for this.

## 2021-02-14 NOTE — ED Notes (Signed)
Patient is aware that her blood pressure is elevated. When asked if she wanted to speak to the DR she stated No she wanted to go home. She was ready to go home. She also stated she eat and take her blood pressure medication when she got home. Patient also declined using a wheelchair to get to her car. Educated on medications and D/C instructions.

## 2021-02-14 NOTE — ED Notes (Signed)
Assumed care of patient. Patient states she has not taken her evening BP medication.

## 2021-02-14 NOTE — ED Provider Notes (Addendum)
[facility]    CSN: [id number] Arrival date & time: 02/14/21  1147      History   Chief Complaint Chief Complaint  Patient presents with  . Headache  . Cough  . Abdominal Pain    HPI Donna Herrera is a 75 y.o. female.   Patient is accompanied by her granddaughter who sees her often  HPI   Cold Symptoms: Pt with a complex medical history reports symptoms of sinus headache, cough and RUQ abdominal pain for the past few days.  She reports that she was seen by her primary care provider about a week ago for sore throat and headache.  She states that this was thought to be viral in nature however this has worsened.  Over the past 2 days her headache has become a 10 out of 10 in nature.  Mainly located in her left temple area.  Described as worst headache of her life.  She has noticed some visual changes of both eyes but worse on the left.  She does recall mild scalp tenderness that is no longer present.  In addition she has had some right upper quadrant abdominal discomfort without any vomiting or stool changes.  No known fevers.  She has used Tylenol but this has not helped at all with her symptoms.  Her granddaughter reports that yesterday she did not want to do things she normally wanted to do and has appeared to be tired but not lethargic in nature.  They have not noticed any slurred speech, coordination difficulty, facial droop.   Past Medical History:  Diagnosis Date  . Allergy   . Arthritis   . Candidiasis, mouth 10/19/2017  . Chronic combined systolic and diastolic CHF (congestive heart failure) ([place]) 10/21/2015   Echo 3/16: EF 40%, diffuse HK, mild MR, moderate LAE, mild RAE, PASP 42 mmHg  //  Echo 12/16: Mild LVH, EF 35-40%, diffuse HK, Gr 2 DD, MAC, mild MR, mod LAE, normal RVSF, PASP 49 mmHg // Echo 5/19: EF 15-20, diff HK, Gr 2 DD, trivial AI, MAC, trivial MR, mod LAE, normal RVSF, mild TR, PASP 62 // Limited Echo 8/19: mod LVH, EF 40-45, diff HK, Gr 1 DD,  MAC, mild LAE   . Chronic left shoulder pain 04/10/2013  . Chronic lung disease   . Chronic pain   . Chronic pain syndrome 07/20/2017  . Depression 08/25/2016  . DJD (degenerative joint disease) 10/12/2012   Multiple joint replacements by Dr. Collier Salina.   . Dyshidrotic eczema 12/31/2014  . Essential hypertension 07/20/2017  . GERD (gastroesophageal reflux disease)   . Gout   . H/O slipped capital femoral epiphysis (SCFE)   . History of total right hip replacement 04/02/2019   Followed closely with orthopedist Dr. Alvan Dame. Patient is s/p total right hip replacement in 1980's with evidence of bone loss and osteopenia making reconstruction unlikely. Plan for conservative treatment at this time with crutches for ambulation and pain control.  . Hyperlipidemia   . Hypertension   . Hypertensive heart disease with CHF (congestive heart failure) ([place]) 10/12/2012  . Loss of taste 02/06/2018  . Mass of breast, left 07/21/2014  . NICM (nonischemic cardiomyopathy) ([place]) 06/09/2016   A. LHC 7/17: Normal coronary arteries, EF 30-35%, LVEDP 14 mmHg  . Pain in gums 04/18/2018  . Pre-diabetes   . Right hip pain 11/02/2012  . Tendonitis, Achilles, left 05/24/2017  . Tobacco abuse 09/29/2014  . Vitamin D deficiency 12/31/2014    Patient Active Problem List  Diagnosis Date Noted  . Open comedone 02/11/2021  . Degenerative disc disease, cervical 11/26/2020  . Rotator cuff arthropathy of both shoulders 11/26/2020  . Tension headache 09/17/2020  . Rib pain on left side 09/17/2020  . Hand and foot pain 08/19/2020  . History of tobacco abuse 03/26/2020  . Prediabetes 03/26/2020  . Adenomatous polyp 03/23/2020  . Essential hypertension 07/20/2017  . NICM (nonischemic cardiomyopathy) ([place]) 06/09/2016  . Chronic combined systolic and diastolic CHF (congestive heart failure) ([place]) 10/21/2015  . Chronic lung disease   . Vitamin D deficiency 12/31/2014  . Hyperlipidemia 09/16/2013  . Hypertensive heart disease with CHF  (congestive heart failure) ([place]) 10/12/2012  . DJD (degenerative joint disease) 10/12/2012  . Encounter for chronic pain management 10/12/2012  . Gout 10/12/2012  . GERD (gastroesophageal reflux disease) 10/12/2012    Past Surgical History:  Procedure Laterality Date  . CARDIAC CATHETERIZATION  2005   normal-[place]  . CARDIAC CATHETERIZATION N/A 05/11/2016   Procedure: Right/Left Heart Cath and Coronary Angiography;  Surgeon: Peter M Martinique, MD;  Location: [facility];  Service: Cardiovascular;  Laterality: N/A;  . CESAREAN SECTION    . COLONOSCOPY  1990's ??  . HIP FUSION Left    left   . JOINT REPLACEMENT Bilateral 1991, 1997   [place]  . ROTATOR CUFF REPAIR     bilateral  . TOOTH EXTRACTION N/A 04/03/2020   Procedure: DENTAL RESTORATION/EXTRACTIONS;  Surgeon: [name], DDS;  Location: [facility];  Service: Oral Surgery;  Laterality: N/A;  . [place]   right. Dr. Collier Salina  . TUBAL LIGATION      OB History   No obstetric history on file.      Home Medications    Prior to Admission medications   Medication Sig Start Date End Date Taking? Authorizing Provider  albuterol (PROVENTIL HFA;VENTOLIN HFA) 108 (90 Base) MCG/ACT inhaler Inhale 1-2 puffs into the lungs every 6 (six) hours as needed for wheezing. 02/25/18   Tanna Furry, MD  allopurinol (ZYLOPRIM) 100 MG tablet Take 100 mg by mouth daily.    [provider]  aspirin 81 MG tablet Take 1 tablet (81 mg total) by mouth daily. 04/16/15   Leeanne Rio, MD  atorvastatin (LIPITOR) 40 MG tablet Take 1 tablet (40 mg total) by mouth daily. 09/17/20   Mullis, Kiersten P, DO  baclofen (LIORESAL) 10 MG tablet TAKE 1 TABLET BY MOUTH THREE TIMES A DAY 12/22/20   Mullis, Kiersten P, DO  Blood Glucose Monitoring Suppl (ONETOUCH VERIO) w/Device KIT Please use to check blood sugar once daily. E11.9 06/12/20   Mullis, Kiersten P, DO  carvedilol (COREG) 3.125 MG tablet Take 1 tablet (3.125 mg  total) by mouth 2 (two) times daily. 12/14/20   Fay Records, MD  colchicine 0.6 MG tablet Take 1 tablet (0.6 mg total) by mouth daily. 12/22/20   Mullis, Kiersten P, DO  furosemide (LASIX) 20 MG tablet TAKE 20 MG TABLET BY MOUTH ON Monday, WED, FRI    [provider]  gabapentin (NEURONTIN) 100 MG capsule Take 2 capsules (200 mg total) by mouth at bedtime. 12/04/20   Mullis, Kiersten P, DO  glucose blood (ONETOUCH VERIO) test strip Please use to check blood sugar once daily. E11.9 06/12/20   Mullis, Kiersten P, DO  hydrALAZINE (APRESOLINE) 50 MG tablet Take 1 tablet (50 mg total) by mouth 3 (three) times daily. 02/12/21   Fay Records, MD  HYDROcodone-acetaminophen (NORCO/VICODIN) 5-325 MG tablet  Take 1 tablet by mouth every 6 (six) hours as needed for moderate pain. 01/29/21   Martyn Malay, MD  Lancet Devices (ONE TOUCH DELICA LANCING DEV) MISC Please use to check blood sugar once daily. E11.9 06/12/20   Mullis, Kiersten P, DO  OneTouch Delica Lancets 93T MISC Please use to check blood sugar once daily. E11.9 06/12/20   Mullis, Kiersten P, DO  Potassium Chloride ER 20 MEQ TBCR TAKE 1 TABLET BY MOUTH AS NEEDED (WHEN TAKING LASIX). 01/04/21   Mullis, Kiersten P, DO  spironolactone (ALDACTONE) 25 MG tablet Take 0.5 tablets (12.5 mg total) by mouth daily. 03/20/20   [name] T, PA-C  triamcinolone ointment (KENALOG) 0.5 % Apply 1 application topically 2 (two) times daily.    [provider]    Family History Family History  Problem Relation Age of Onset  . Depression Mother   . Diabetes Mother   . Hypertension Mother   . Stroke Mother   . Heart disease Mother   . Cancer Father        unknown primary  . Diabetes Sister   . Stomach cancer Brother   . Throat cancer Son   . Breast cancer Neg Hx   . Esophageal cancer Neg Hx   . Colon cancer Neg Hx   . Colon polyps Neg Hx     Social History Social History   Tobacco Use  . Smoking status: Former Smoker    Packs/day: 0.50     Years: 55.00    Pack years: 27.50    Quit date: 2017    Years since quitting: 5.2  . Smokeless tobacco: Never Used  Vaping Use  . Vaping Use: Never used  Substance Use Topics  . Alcohol use: Not Currently    Comment: on holidays  . Drug use: No     Allergies   Crab [shellfish allergy] and Morphine and related   Review of Systems Review of Systems  As stated above in HPI Physical Exam Triage Vital Signs ED Triage Vitals  Enc Vitals Group     BP 02/14/21 1235 (!) 153/68     Pulse Rate 02/14/21 1235 62     Resp 02/14/21 1235 16     Temp 02/14/21 1235 98.8 F (37.1 C)     Temp Source 02/14/21 1235 Oral     SpO2 02/14/21 1235 98 %     Weight --      Height --      Head Circumference --      Peak Flow --      Pain Score 02/14/21 1233 10     Pain Loc --      Pain Edu? --      Excl. in [place]? --    No data found.  Updated Vital Signs BP (!) 153/68   Pulse 62   Temp 98.8 F (37.1 C) (Oral)   Resp 16   SpO2 98%   Physical Exam Vitals and nursing note reviewed.  Constitutional:      General: She is not in acute distress.    Appearance: She is well-developed. She is not ill-appearing, toxic-appearing or diaphoretic.  HENT:     Head: Normocephalic and atraumatic.     Comments: NO tenderness to palpation of scalp    Mouth/Throat:     Mouth: Mucous membranes are moist.     Pharynx: Oropharynx is clear.  Eyes:     Extraocular Movements: Extraocular movements intact.     Right  eye: Normal extraocular motion and no nystagmus.     Left eye: Normal extraocular motion and no nystagmus.     Pupils: Pupils are equal, round, and reactive to light.     Right eye: Pupil is round and reactive.     Left eye: Pupil is round and reactive.  Cardiovascular:     Rate and Rhythm: Normal rate and regular rhythm.     Heart sounds: Normal heart sounds.  Pulmonary:     Effort: Pulmonary effort is normal.     Breath sounds: Normal breath sounds.  Abdominal:     General: Bowel  sounds are normal.     Tenderness: There is abdominal tenderness (RUQ). There is guarding.  Musculoskeletal:     Cervical back: Normal range of motion and neck supple.  Lymphadenopathy:     Cervical: No cervical adenopathy.  Skin:    General: Skin is warm.     Comments: No jaundice noted  Neurological:     Mental Status: She is alert and oriented to person, place, and time.     Cranial Nerves: No cranial nerve deficit or facial asymmetry.     Motor: No weakness.     Coordination: Coordination normal.     Gait: Gait abnormal (uses cane for support very successfully ).     Deep Tendon Reflexes: Reflexes normal.  Psychiatric:        Mood and Affect: Mood normal. Mood is not depressed.        Speech: Speech normal.        Behavior: Behavior normal.        Cognition and Memory: Memory is not impaired.      UC Treatments / Results  Labs (all labs ordered are listed, but only abnormal results are displayed) Labs Reviewed - No data to display  EKG   Radiology No results found.  Procedures Procedures (including critical care time)  Medications Ordered in UC Medications - No data to display  Initial Impression / Assessment and Plan / UC Course  I have reviewed the triage vital signs and the nursing notes.  Pertinent labs & imaging results that were available during my care of the patient were reviewed by me and considered in my medical decision making (see chart for details).     New to me.  I am concerned with her worst headache of the life 10 out of 10 headache that has been present for 2 days.  Headache is worsening.  She does not appear to have any signs or symptoms of a stroke at this time.  She is very interactive with the exam.  I would like for her to be evaluated right away in the emergency room to rule out bleed vs former stroke.  As her vital signs are essentially stable and she has no signs of a stroke her granddaughter would like to take her via private vehicle  directly to the emergency room across the street.  I also want them to evaluate her right upper quadrant pain which is fairly significant.  Final Clinical Impressions(s) / UC Diagnoses   Final diagnoses:  None   Discharge Instructions   None    ED Prescriptions    None     PDMP not reviewed this encounter.   Hughie Closs, PA-C 02/14/21 1320    [address] [place] 02/14/21 1321

## 2021-02-14 NOTE — ED Notes (Signed)
Patient is being discharged from the Urgent Care and sent to the Emergency Department via POV . Pt has a ride from support person to the ED. Per Nelwyn Salisbury, PA, patient is in need of higher level of care due to 10/10 headache and RUQ abdominal pain. Patient is aware and verbalizes understanding of plan of care.  Vitals:   02/14/21 1235  BP: (!) 153/68  Pulse: 62  Resp: 16  Temp: 98.8 F (37.1 C)  SpO2: 98%

## 2021-02-14 NOTE — ED Triage Notes (Signed)
Cough, sore throat, RUQ abdominal pain, headache for 2 days. Denies diarrhea, fever, nausea, vomiting.

## 2021-02-14 NOTE — Discharge Instructions (Addendum)
Please go directly to the emergency room 

## 2021-02-14 NOTE — ED Provider Notes (Signed)
[facility] DEPT Provider Note   CSN: [id number] Arrival date & time: 02/14/21  1649     History Chief Complaint  Patient presents with  . Flank Pain  . Sore Throat  . Headache    Donna Herrera is a 75 y.o. female.  Patient referred in from urgent care for complaint of headache cough and sore throat.  And also right-sided abdominal pain.  However patient here states that there may have been some confusion she points to her right hip which has been some chronic pain.  And to the crease of her right groin.  No nausea vomiting diarrhea denies any abdominal pain.  Denies any dysuria.  Patient has had all the Covid vaccinations as well as the booster.  In addition patient did have COVID back in August 2021.  Patient thought that congestion and cough was probably secondary to allergies.  Also had some watery eyes.  But no eye pain.        Past Medical History:  Diagnosis Date  . Allergy   . Arthritis   . Candidiasis, mouth 10/19/2017  . Chronic combined systolic and diastolic CHF (congestive heart failure) (Donna Herrera) 10/21/2015   Echo 3/16: EF 40%, diffuse HK, mild MR, moderate LAE, mild RAE, PASP 42 mmHg  //  Echo 12/16: Mild LVH, EF 35-40%, diffuse HK, Gr 2 DD, MAC, mild MR, mod LAE, normal RVSF, PASP 49 mmHg // Echo 5/19: EF 15-20, diff HK, Gr 2 DD, trivial AI, MAC, trivial MR, mod LAE, normal RVSF, mild TR, PASP 62 // Limited Echo 8/19: mod LVH, EF 40-45, diff HK, Gr 1 DD, MAC, mild LAE   . Chronic left shoulder pain 04/10/2013  . Chronic lung disease   . Chronic pain   . Chronic pain syndrome 07/20/2017  . Depression 08/25/2016  . DJD (degenerative joint disease) 10/12/2012   Multiple joint replacements by Dr. Collier Salina.   . Dyshidrotic eczema 12/31/2014  . Essential hypertension 07/20/2017  . GERD (gastroesophageal reflux disease)   . Gout   . H/O slipped capital femoral epiphysis (SCFE)   . History of total right hip replacement 04/02/2019    Followed closely with orthopedist Dr. Alvan Dame. Patient is s/p total right hip replacement in 1980's with evidence of bone loss and osteopenia making reconstruction unlikely. Plan for conservative treatment at this time with crutches for ambulation and pain control.  . Hyperlipidemia   . Hypertension   . Hypertensive heart disease with CHF (congestive heart failure) ([place]) 10/12/2012  . Loss of taste 02/06/2018  . Mass of breast, left 07/21/2014  . NICM (nonischemic cardiomyopathy) (Donna Herrera) 06/09/2016   A. LHC 7/17: Normal coronary arteries, EF 30-35%, LVEDP 14 mmHg  . Pain in gums 04/18/2018  . Pre-diabetes   . Right hip pain 11/02/2012  . Tendonitis, Achilles, left 05/24/2017  . Tobacco abuse 09/29/2014  . Vitamin D deficiency 12/31/2014    Patient Active Problem List   Diagnosis Date Noted  . Open comedone 02/11/2021  . Degenerative disc disease, cervical 11/26/2020  . Rotator cuff arthropathy of both shoulders 11/26/2020  . Tension headache 09/17/2020  . Rib pain on left side 09/17/2020  . Hand and foot pain 08/19/2020  . History of tobacco abuse 03/26/2020  . Prediabetes 03/26/2020  . Adenomatous polyp 03/23/2020  . Essential hypertension 07/20/2017  . NICM (nonischemic cardiomyopathy) ([place]) 06/09/2016  . Chronic combined systolic and diastolic CHF (congestive heart failure) ([place]) 10/21/2015  . Chronic lung disease   . Vitamin D deficiency  12/31/2014  . Hyperlipidemia 09/16/2013  . Hypertensive heart disease with CHF (congestive heart failure) ([place]) 10/12/2012  . DJD (degenerative joint disease) 10/12/2012  . Encounter for chronic pain management 10/12/2012  . Gout 10/12/2012  . GERD (gastroesophageal reflux disease) 10/12/2012    Past Surgical History:  Procedure Laterality Date  . CARDIAC CATHETERIZATION  2005   normal-[place]  . CARDIAC CATHETERIZATION N/A 05/11/2016   Procedure: Right/Left Heart Cath and Coronary Angiography;  Surgeon: Peter M Martinique, MD;  Location: [facility];  Service: Cardiovascular;  Laterality: N/A;  . CESAREAN SECTION    . COLONOSCOPY  1990's ??  . HIP FUSION Left    left   . JOINT REPLACEMENT Bilateral 1991, 1997   [place]  . ROTATOR CUFF REPAIR     bilateral  . TOOTH EXTRACTION N/A 04/03/2020   Procedure: DENTAL RESTORATION/EXTRACTIONS;  Surgeon: [name], DDS;  Location: [facility];  Service: Oral Surgery;  Laterality: N/A;  . [place]   right. Dr. Collier Salina  . TUBAL LIGATION       OB History   No obstetric history on file.     Family History  Problem Relation Age of Onset  . Depression Mother   . Diabetes Mother   . Hypertension Mother   . Stroke Mother   . Heart disease Mother   . Cancer Father        unknown primary  . Diabetes Sister   . Stomach cancer Brother   . Throat cancer Son   . Breast cancer Neg Hx   . Esophageal cancer Neg Hx   . Colon cancer Neg Hx   . Colon polyps Neg Hx     Social History   Tobacco Use  . Smoking status: Former Smoker    Packs/day: 0.50    Years: 55.00    Pack years: 27.50    Quit date: 2017    Years since quitting: 5.2  . Smokeless tobacco: Never Used  Vaping Use  . Vaping Use: Never used  Substance Use Topics  . Alcohol use: Not Currently    Comment: on holidays  . Drug use: No    Home Medications Prior to Admission medications   Medication Sig Start Date End Date Taking? Authorizing Provider  cephALEXin (KEFLEX) 500 MG capsule Take 1 capsule (500 mg total) by mouth 4 (four) times daily. 02/14/21  Yes Fredia Sorrow, MD  albuterol (PROVENTIL HFA;VENTOLIN HFA) 108 (90 Base) MCG/ACT inhaler Inhale 1-2 puffs into the lungs every 6 (six) hours as needed for wheezing. 02/25/18   Tanna Furry, MD  allopurinol (ZYLOPRIM) 100 MG tablet Take 100 mg by mouth daily.    [provider]  aspirin 81 MG tablet Take 1 tablet (81 mg total) by mouth daily. 04/16/15   Leeanne Rio, MD  atorvastatin (LIPITOR) 40 MG tablet Take 1 tablet (40 mg  total) by mouth daily. 09/17/20   Mullis, Kiersten P, DO  baclofen (LIORESAL) 10 MG tablet TAKE 1 TABLET BY MOUTH THREE TIMES A DAY 12/22/20   Mullis, Kiersten P, DO  Blood Glucose Monitoring Suppl (ONETOUCH VERIO) w/Device KIT Please use to check blood sugar once daily. E11.9 06/12/20   Mullis, Kiersten P, DO  carvedilol (COREG) 3.125 MG tablet Take 1 tablet (3.125 mg total) by mouth 2 (two) times daily. 12/14/20   Fay Records, MD  colchicine 0.6 MG tablet Take 1 tablet (0.6 mg total) by mouth daily. 12/22/20   Mullis, Archie Endo,  DO  furosemide (LASIX) 20 MG tablet TAKE 20 MG TABLET BY MOUTH ON Monday, WED, FRI    [provider]  gabapentin (NEURONTIN) 100 MG capsule Take 2 capsules (200 mg total) by mouth at bedtime. 12/04/20   Mullis, Kiersten P, DO  glucose blood (ONETOUCH VERIO) test strip Please use to check blood sugar once daily. E11.9 06/12/20   Mullis, Kiersten P, DO  hydrALAZINE (APRESOLINE) 50 MG tablet Take 1 tablet (50 mg total) by mouth 3 (three) times daily. 02/12/21   Fay Records, MD  HYDROcodone-acetaminophen (NORCO/VICODIN) 5-325 MG tablet Take 1 tablet by mouth every 6 (six) hours as needed for moderate pain. 01/29/21   Martyn Malay, MD  Lancet Devices (ONE TOUCH DELICA LANCING DEV) MISC Please use to check blood sugar once daily. E11.9 06/12/20   Mullis, Kiersten P, DO  OneTouch Delica Lancets 17G MISC Please use to check blood sugar once daily. E11.9 06/12/20   Mullis, Kiersten P, DO  Potassium Chloride ER 20 MEQ TBCR TAKE 1 TABLET BY MOUTH AS NEEDED (WHEN TAKING LASIX). 01/04/21   Mullis, Kiersten P, DO  spironolactone (ALDACTONE) 25 MG tablet Take 0.5 tablets (12.5 mg total) by mouth daily. 03/20/20   [name] T, PA-C  triamcinolone ointment (KENALOG) 0.5 % Apply 1 application topically 2 (two) times daily.    [provider]    Allergies    Otho Darner allergy] and Morphine and related  Review of Systems   Review of Systems  Constitutional: Negative  for chills and fever.  HENT: Positive for congestion and sore throat. Negative for rhinorrhea.   Eyes: Negative for visual disturbance.  Respiratory: Positive for cough. Negative for shortness of breath.   Cardiovascular: Negative for chest pain and leg swelling.  Gastrointestinal: Negative for abdominal pain, diarrhea, nausea and vomiting.  Genitourinary: Negative for dysuria.  Musculoskeletal: Positive for arthralgias. Negative for back pain and neck pain.  Skin: Negative for rash.  Neurological: Positive for headaches. Negative for dizziness and light-headedness.  Hematological: Does not bruise/bleed easily.  Psychiatric/Behavioral: Negative for confusion.    Physical Exam Updated Vital Signs BP (!) 184/87 (BP Location: Left Arm)   Pulse 70   Temp 98.7 F (37.1 C) (Oral)   Resp 18   Ht 1.702 m (_0 )   Wt 84.4 kg   SpO2 100%   BMI 29.13 kg/m   Physical Exam Vitals and nursing note reviewed.  Constitutional:      General: She is not in acute distress.    Appearance: Normal appearance. She is well-developed.  HENT:     Head: Normocephalic and atraumatic.     Mouth/Throat:     Mouth: Mucous membranes are moist.     Pharynx: Oropharynx is clear. No oropharyngeal exudate or posterior oropharyngeal erythema.  Eyes:     Extraocular Movements: Extraocular movements intact.     Conjunctiva/sclera: Conjunctivae normal.     Pupils: Pupils are equal, round, and reactive to light.  Cardiovascular:     Rate and Rhythm: Normal rate and regular rhythm.     Heart sounds: No murmur heard.   Pulmonary:     Effort: Pulmonary effort is normal. No respiratory distress.     Breath sounds: Normal breath sounds.  Abdominal:     General: There is no distension.     Palpations: Abdomen is soft. There is no mass.     Tenderness: There is no abdominal tenderness.     Comments: Abdomen nontender to palpation all  quadrants.  Musculoskeletal:        General: Tenderness present. No  swelling.     Cervical back: Normal range of motion and neck supple. No rigidity.     Comments: Patient with some tenderness to palpation to the lateral aspect of her right hip.  And at the groin crease.  But no mass.  Skin:    General: Skin is warm and dry.     Capillary Refill: Capillary refill takes less than 2 seconds.  Neurological:     General: No focal deficit present.     Mental Status: She is alert and oriented to person, place, and time.     Cranial Nerves: No cranial nerve deficit.     Sensory: No sensory deficit.     Motor: No weakness.     ED Results / Procedures / Treatments   Labs (all labs ordered are listed, but only abnormal results are displayed) Labs Reviewed  URINALYSIS, ROUTINE W REFLEX MICROSCOPIC - Abnormal; Notable for the following components:      Result Value   Specific Gravity, Urine 1.004 (*)    Hgb urine dipstick SMALL (*)    Ketones, ur 5 (*)    Nitrite POSITIVE (*)    Leukocytes,Ua LARGE (*)    WBC, UA >50 (*)    Bacteria, UA MANY (*)    All other components within normal limits  RESP PANEL BY RT-PCR (FLU A&B, COVID) [id number]  URINE CULTURE    EKG None  Radiology DG Chest Port 1 View  Result Date: 02/14/2021 CLINICAL DATA:  Cough and sore throat EXAM: PORTABLE CHEST 1 VIEW COMPARISON:  02/25/2018 FINDINGS: The heart size and mediastinal contours are within normal limits. Both lungs are clear. The visualized skeletal structures are unremarkable. IMPRESSION: No active disease. Electronically Signed   By: Inez Catalina M.D.   On: 02/14/2021 18:19    Procedures Procedures   Medications Ordered in ED Medications  cephALEXin (KEFLEX) capsule 500 mg (has no administration in time range)    ED Course  I have reviewed the triage vital signs and the nursing notes.  Pertinent labs & imaging results that were available during my care of the patient were reviewed by me and considered in my medical decision making (see chart for details).    MDM  Rules/Calculators/A&P                          Patient is work-up here no real abdominal tenderness.  And pain seems to be more related to her right hip which she states she had a hip replacement.  And she has had pain in that area for a while.  There is some increased pain with range of motion of her right leg.  But no obvious deformity.  Distally neurovascularly intact.  Oropharynx is clear no exudate.  Uvula midline.  No facial tenderness over the sinuses.  Patient nontoxic no acute distress.  Covid testing influenza testing negative.  Patient's urinalysis is consistent with urinary tract infection although patient has no specific symptoms.  Patient will be started on Keflex and continued on Keflex at home.  Patient will do a trial of over-the-counter Claritin that she has at home for the cough and congestion.  Chest x-ray was negative for pneumonia.  Also clinically no concern for any right hip fracture.  No history of fall or injury.  And is no worse than baseline. Final Clinical Impression(s) / ED Diagnoses Final diagnoses:  Upper respiratory tract infection, unspecified type  Acute cystitis without hematuria    Rx / DC Orders ED Discharge Orders         Ordered    cephALEXin (KEFLEX) 500 MG capsule  4 times daily        02/14/21 1955           Fredia Sorrow, MD 02/14/21 2000

## 2021-02-14 NOTE — ED Triage Notes (Signed)
Patient c/o sore throat , right flank pain that radiates into the right lower abdomen, and a headache x 2 days. Patient went to [facility] and was told to come to the ED for further evaluation.

## 2021-02-17 LAB — URINE CULTURE: Culture: >=100000 — AB

## 2021-02-18 ENCOUNTER — Telehealth: Payer: Self-pay | Admitting: *Deleted

## 2021-02-18 NOTE — Telephone Encounter (Signed)
Post ED Visit - Positive Culture Follow-up: Successful Patient Follow-Up  Culture assessed and recommendations reviewed by:  []  Elenor Quinones, Pharm.D. []  Heide Guile, Pharm.D., BCPS AQ-ID []  Parks Neptune, Pharm.D., BCPS []  Alycia Rossetti, Pharm.D., BCPS []  [place], Pharm.D., BCPS, AAHIVP []  Legrand Como, Pharm.D., BCPS, AAHIVP []  Salome Arnt, PharmD, BCPS []  Johnnette Gourd, PharmD, BCPS []  Hughes Better, PharmD, BCPS []  Leeroy Cha, PharmD  Positive urine culture  []  Patient discharged without antimicrobial prescription and treatment is now indicated []  Organism is resistant to prescribed ED discharge antimicrobial []  Patient with positive blood cultures  Stop Cephalexin, no treatment needed. Changes discussed with ED provider: Krista Blue, PA       Ardeen Fillers 02/18/2021, 9:10 AM

## 2021-02-18 NOTE — Progress Notes (Signed)
ED Antimicrobial Stewardship Positive Culture Follow Up   Donna Herrera is an 75 y.o. female who presented to [facility] on 02/14/2021 with a chief complaint of  Chief Complaint  Patient presents with  . Flank Pain  . Sore Throat  . Headache    Recent Results (from the past 720 hour(s))  SARS CORONAVIRUS 2 (TAT 6-24 HRS) Nasopharyngeal Nasopharyngeal Swab     Status: None   Collection Time: 02/14/21  1:00 PM   Specimen: Nasopharyngeal Swab  Result Value Ref Range Status   SARS Coronavirus 2 NEGATIVE NEGATIVE Final    Comment: (NOTE) SARS-CoV-2 target nucleic acids are NOT DETECTED.  The SARS-CoV-2 RNA is generally detectable in upper and lower respiratory specimens during the acute phase of infection. Negative results do not preclude SARS-CoV-2 infection, do not rule out co-infections with other pathogens, and should not be used as the sole basis for treatment or other patient management decisions. Negative results must be combined with clinical observations, patient history, and epidemiological information. The expected result is Negative.  Fact Sheet for Patients: SugarRoll.be  Fact Sheet for Healthcare Providers: https://www.woods-mathews.com/  This test is not yet approved or cleared by the [place] FDA and  has been authorized for detection and/or diagnosis of SARS-CoV-2 by FDA under an Emergency Use Authorization (EUA). This EUA will remain  in effect (meaning this test can be used) for the duration of the COVID-19 declaration under Se ction 564(b)(1) of the Act, 21 U.S.C. section 360bbb-3(b)(1), unless the authorization is terminated or revoked sooner.  Performed at [facility], [place] [address] [place], [place] [address]   Resp Panel by RT-PCR (Flu A&B, Covid) Nasopharyngeal Swab     Status: None   Collection Time: 02/14/21  5:28 PM   Specimen: Nasopharyngeal Swab; Nasopharyngeal(NP) swabs in vial  transport medium  Result Value Ref Range Status   SARS Coronavirus 2 by RT PCR NEGATIVE NEGATIVE Final    Comment: (NOTE) SARS-CoV-2 target nucleic acids are NOT DETECTED.  The SARS-CoV-2 RNA is generally detectable in upper respiratory specimens during the acute phase of infection. The lowest concentration of SARS-CoV-2 viral copies this assay can detect is 138 copies/mL. A negative result does not preclude SARS-Cov-2 infection and should not be used as the sole basis for treatment or other patient management decisions. A negative result may occur with  improper specimen collection/handling, submission of specimen other than nasopharyngeal swab, presence of viral mutation(s) within the areas targeted by this assay, and inadequate number of viral copies(<138 copies/mL). A negative result must be combined with clinical observations, patient history, and epidemiological information. The expected result is Negative.  Fact Sheet for Patients:  EntrepreneurPulse.com.au  Fact Sheet for Healthcare Providers:  IncredibleEmployment.be  This test is no t yet approved or cleared by the [place] FDA and  has been authorized for detection and/or diagnosis of SARS-CoV-2 by FDA under an Emergency Use Authorization (EUA). This EUA will remain  in effect (meaning this test can be used) for the duration of the COVID-19 declaration under Section 564(b)(1) of the Act, 21 U.S.C.section 360bbb-3(b)(1), unless the authorization is terminated  or revoked sooner.       Influenza A by PCR NEGATIVE NEGATIVE Final   Influenza B by PCR NEGATIVE NEGATIVE Final    Comment: (NOTE) The Xpert Xpress SARS-CoV-2/FLU/RSV plus assay is intended as an aid in the diagnosis of influenza from Nasopharyngeal swab specimens and should not be used as a sole basis for treatment. Nasal washings  and aspirates are unacceptable for Xpert Xpress SARS-CoV-2/FLU/RSV testing.  Fact  Sheet for Patients: EntrepreneurPulse.com.au  Fact Sheet for Healthcare Providers: IncredibleEmployment.be  This test is not yet approved or cleared by the [place] FDA and has been authorized for detection and/or diagnosis of SARS-CoV-2 by FDA under an Emergency Use Authorization (EUA). This EUA will remain in effect (meaning this test can be used) for the duration of the COVID-19 declaration under Section 564(b)(1) of the Act, 21 U.S.C. section 360bbb-3(b)(1), unless the authorization is terminated or revoked.  Performed at [facility], [place] [address] [place], [place] [address]   Urine Culture     Status: Abnormal   Collection Time: 02/14/21  7:48 PM   Specimen: Urine, Random  Result Value Ref Range Status   Specimen Description   Final    URINE, RANDOM Performed at [facility] [address], [place], [place] [address]    Special Requests   Final    NONE Performed at [facility] [place] [address], [place], [place] [address]    Culture >=100,000 COLONIES/mL ESCHERICHIA COLI (A)  Final   Report Status 02/17/2021 FINAL  Final   Organism ID, Bacteria ESCHERICHIA COLI (A)  Final      Susceptibility   Escherichia coli - MIC*    AMPICILLIN >=32 RESISTANT Resistant     CEFAZOLIN >=64 RESISTANT Resistant     CEFEPIME <=0.12 SENSITIVE Sensitive     CEFTRIAXONE 0.5 SENSITIVE Sensitive     CIPROFLOXACIN <=0.25 SENSITIVE Sensitive     GENTAMICIN <=1 SENSITIVE Sensitive     IMIPENEM <=0.25 SENSITIVE Sensitive     NITROFURANTOIN <=16 SENSITIVE Sensitive     TRIMETH/SULFA <=20 SENSITIVE Sensitive     AMPICILLIN/SULBACTAM >=32 RESISTANT Resistant     PIP/TAZO 8 SENSITIVE Sensitive     * >=100,000 COLONIES/mL ESCHERICHIA COLI    [x]  Treated with cephalexin, organism resistant to prescribed antimicrobial   New antibiotic prescription: no antibiotics needed, stop cephalexin   ED  Provider: Krista Blue PA-C   Phillis Haggis 02/18/2021, 8:49 AM Clinical Pharmacist Monday - Friday phone -  [phone] Saturday - Sunday phone - [phone]

## 2021-02-25 ENCOUNTER — Ambulatory Visit
Admission: RE | Admit: 2021-02-25 | Discharge: 2021-02-25 | Disposition: A | Discharge status: Home / Self Care | Payer: Medicare Other | Source: Ambulatory Visit | Attending: Family Medicine | Admitting: Family Medicine

## 2021-02-25 ENCOUNTER — Ambulatory Visit: Payer: Medicare Other

## 2021-02-25 DIAGNOSIS — Z87891 Personal history of nicotine dependence: Secondary | ICD-10-CM | POA: Diagnosis not present

## 2021-02-25 DIAGNOSIS — I2510 Atherosclerotic heart disease of native coronary artery without angina pectoris: Secondary | ICD-10-CM | POA: Diagnosis not present

## 2021-02-25 DIAGNOSIS — J479 Bronchiectasis, uncomplicated: Secondary | ICD-10-CM | POA: Diagnosis not present

## 2021-02-25 DIAGNOSIS — J432 Centrilobular emphysema: Secondary | ICD-10-CM | POA: Diagnosis not present

## 2021-02-26 ENCOUNTER — Telehealth: Payer: Self-pay

## 2021-02-26 NOTE — Telephone Encounter (Signed)
Received call report from [facility] Radiology with the following results.   Lung-RADS 4A, suspicious. 10 mm posterior left lower lobe pulmonary nodule, possibly scarring. Follow up low-dose chest CT without contrast in 3 months (please use the following order, "CT CHEST LCS NODULE FOLLOW-UP W/O CM") is recommended. Alternatively, PET may be considered when there is a solid component 35mm or larger.   Please advise.   Talbot Grumbling, RN

## 2021-03-01 ENCOUNTER — Other Ambulatory Visit: Payer: Self-pay | Admitting: Physician Assistant

## 2021-03-02 ENCOUNTER — Encounter: Payer: Self-pay | Admitting: Family Medicine

## 2021-03-02 ENCOUNTER — Other Ambulatory Visit: Payer: Self-pay | Admitting: Family Medicine

## 2021-03-02 DIAGNOSIS — J8410 Pulmonary fibrosis, unspecified: Secondary | ICD-10-CM

## 2021-03-02 DIAGNOSIS — J439 Emphysema, unspecified: Secondary | ICD-10-CM

## 2021-03-02 DIAGNOSIS — R911 Solitary pulmonary nodule: Secondary | ICD-10-CM

## 2021-03-02 DIAGNOSIS — G894 Chronic pain syndrome: Secondary | ICD-10-CM

## 2021-03-02 DIAGNOSIS — J449 Chronic obstructive pulmonary disease, unspecified: Secondary | ICD-10-CM | POA: Insufficient documentation

## 2021-03-02 DIAGNOSIS — Z96641 Presence of right artificial hip joint: Secondary | ICD-10-CM

## 2021-03-02 HISTORY — DX: Pulmonary fibrosis, unspecified: J84.10

## 2021-03-02 HISTORY — DX: Solitary pulmonary nodule: R91.1

## 2021-03-02 HISTORY — DX: Emphysema, unspecified: J43.9

## 2021-03-02 MED ORDER — HYDROCODONE-ACETAMINOPHEN 5-325 MG PO TABS: 1.0000 | ORAL_TABLET | Freq: Four times a day (QID) | ORAL | 0 refills | Status: DC | PRN

## 2021-03-03 NOTE — Telephone Encounter (Signed)
Noted. Follow up CT ordered and to be scheduled. Patient declined pulmonology referral at this time.

## 2021-03-04 NOTE — Telephone Encounter (Signed)
RX for DM shoes still on file, mailed to patient in case needed in the future.

## 2021-03-07 NOTE — Progress Notes (Signed)
   Subjective:   Patient ID: Donna Herrera    DOB: [birth year], 75 y.o. female   MRN: [id number]  Donna Herrera is a 75 y.o. female with a history of chronic combined systolic and diastolic heart failure, hypertension, nonischemic cardiomyopathy, chronic lung disease with emphysema and pulmonary fibrosis, GERD, degenerative disc disease, rotator cuff arthropathy of both shoulders, chronic pain, gout, history of tobacco abuse, hyperlipidemia, prediabetes, pulmonary nodule on CT chest, rib pain, tension headache, vitamin D deficiency here for hospital follow-up  Hospital Follow 2/2 URI: Patient evaluated in ED on 4/17 and diagnosed with URI.  COVID and flu negative.  Chest x-ray negative for pneumonia.  She was recommended symptomatic treatment and trial of over-the-counter Claritin.  Today she notes she is much improved.  She is also noted to have a UTI and treated with Keflex.  Urine culture notable for greater than 100 K E. Coli resistant to Cefazolin. She was informed to discontinue all treatment per ED follow up note. Today she notes she completed the antibiotic.   Review of Systems:  Per HPI.   Objective:   BP 130/60   Pulse 64   Ht 5\' 8"  (1.727 m)   Wt 184 lb 6 oz (83.6 kg)   SpO2 98%   BMI 28.03 kg/m  Vitals and nursing note reviewed.  General: pleasant older female, appears comfortable during exam today, well nourished, well developed, in no acute distress with non-toxic appearance HEENT: nares with swollen turbinates, appears to have small sore in left turbinate that does not appear infected  CV: regular rate and rhythm Lungs: clear to auscultation bilaterally with normal work of breathing on room air, speaking in full sentences Skin: warm, dry MSK:  gait antalgic, using crutches today (uses walker at home) Neuro: Alert and oriented, speech normal  Assessment & Plan:   Encounter for chronic pain management Chronic. Notes worsening pain requiring extra  doses of medicine at times to control pain and allow her to maintain ADL's. Currently on Hydrocodone-Acetamenophen 5-325mg : 1PO q6h PRN (#30), Baclofen 10mg  TID PRN, and gapapentin 200mg  qHS. Denies CNS effects from these medications. Would like to have a few extra Norco for the occasional increased pain. Given age, would like to maximize not sedating medications prior to increasing number of Norco. Not able to use NSAIDs given HFrEF and prior exacerbations from NSAIDs. - start scheduled OTC Tylenol ES BID  - Hydrocodone-tylenol 5-325mg  PRN for break through pain  - continue Gabapentin and Baclofen as prescribed - patient to call in 2 weeks if pain is not improved, will increase current Norco from #30 per month to #45 per month to allow patient to maintain ADL's.  Pulmonary nodule 1 cm or greater in diameter Follow up CT scan scheduled 06/02/21 Denies any hemoptysis or B symptoms.  URI Follow up: Improved. Continues to have wet cough x 4 weeks. Recommended follow up if persists another 2-3 weeks, sooner if worsening. Consider repeat CXR at that time.   No orders of the defined types were placed in this encounter.  No orders of the defined types were placed in this encounter.     Mina Marble, DO PGY-3, [facility] 03/11/2021 12:19 PM

## 2021-03-09 ENCOUNTER — Ambulatory Visit: Payer: Medicare Other

## 2021-03-11 ENCOUNTER — Other Ambulatory Visit: Payer: Self-pay

## 2021-03-11 ENCOUNTER — Encounter: Payer: Self-pay | Admitting: Family Medicine

## 2021-03-11 ENCOUNTER — Ambulatory Visit (INDEPENDENT_AMBULATORY_CARE_PROVIDER_SITE_OTHER): Payer: Medicare Other | Admitting: Family Medicine

## 2021-03-11 VITALS — BP 130/60 | HR 64 | Ht 68.0 in | Wt 184.4 lb

## 2021-03-11 DIAGNOSIS — J069 Acute upper respiratory infection, unspecified: Secondary | ICD-10-CM

## 2021-03-11 DIAGNOSIS — R911 Solitary pulmonary nodule: Secondary | ICD-10-CM | POA: Diagnosis not present

## 2021-03-11 DIAGNOSIS — G8929 Other chronic pain: Secondary | ICD-10-CM | POA: Diagnosis not present

## 2021-03-11 NOTE — Assessment & Plan Note (Signed)
Follow up CT scan scheduled 06/02/21 Denies any hemoptysis or B symptoms.

## 2021-03-11 NOTE — Patient Instructions (Signed)
Take Tylenol extra strength twice a day every day no matter what Take your Hydrocodone as needed for breakthrough pain Call me in 2 weeks if no improvement Follow up on 03/18/21 for dermatology appointment Your lungs are clear today. Call me if you cough does not improve in another 2-3 weeks and we will repeat Chest x-ray

## 2021-03-11 NOTE — Assessment & Plan Note (Signed)
Chronic. Notes worsening pain requiring extra doses of medicine at times to control pain and allow her to maintain ADL's. Currently on Hydrocodone-Acetamenophen 5-325mg : 1PO q6h PRN (#30), Baclofen 10mg  TID PRN, and gapapentin 200mg  qHS. Denies CNS effects from these medications. Would like to have a few extra Norco for the occasional increased pain. Given age, would like to maximize not sedating medications prior to increasing number of Norco. Not able to use NSAIDs given HFrEF and prior exacerbations from NSAIDs. - start scheduled OTC Tylenol ES BID  - Hydrocodone-tylenol 5-325mg  PRN for break through pain  - continue Gabapentin and Baclofen as prescribed - patient to call in 2 weeks if pain is not improved, will increase current Norco from #30 per month to #45 per month to allow patient to maintain ADL's.

## 2021-03-12 ENCOUNTER — Ambulatory Visit: Payer: Medicare Other

## 2021-03-16 ENCOUNTER — Ambulatory Visit: Payer: Medicare Other

## 2021-03-18 ENCOUNTER — Ambulatory Visit (INDEPENDENT_AMBULATORY_CARE_PROVIDER_SITE_OTHER): Payer: Medicare Other

## 2021-03-18 ENCOUNTER — Other Ambulatory Visit: Payer: Self-pay

## 2021-03-18 ENCOUNTER — Ambulatory Visit (INDEPENDENT_AMBULATORY_CARE_PROVIDER_SITE_OTHER): Payer: Medicare Other | Admitting: Family Medicine

## 2021-03-18 VITALS — BP 141/61 | HR 63 | Wt 188.4 lb

## 2021-03-18 DIAGNOSIS — L720 Epidermal cyst: Secondary | ICD-10-CM | POA: Diagnosis not present

## 2021-03-18 DIAGNOSIS — Z23 Encounter for immunization: Secondary | ICD-10-CM | POA: Diagnosis not present

## 2021-03-18 NOTE — Assessment & Plan Note (Signed)
Assessment: 75 year old female with large cystic-like area between her eyes.  This area previously did have purulent drainage and she previously tried to lance it with some whitish material which came out.  It is not painful to palpate but is pruritic at times.  Seems to increase and decrease in size.  It is freely mobile on physical exam.  Differential can include sebaceous/inclusion cyst, less likely lipoma as the lesion is not soft and the purulent drainage in the past suggest more of a cystic structure. Plan: -Because of the location of this lesion we recommended an incision to remove it, however due to location recommend a referral to general or plastic surgery in order for this to occur.  Upon discussing with the patient she would prefer referral for plastic surgery.  We will place this referral.

## 2021-03-18 NOTE — Patient Instructions (Signed)
Our plan for today: -We have placed a referral for plastic surgery to remove the cyst.  They should reach out to you in the next 1 to 2 weeks to schedule a future appointment.  If they do not please let us know in 2 weeks. -Once the cyst is removed you should have no further pain in that region after it is healed. -If you have any other concerns in the meantime please let us know.

## 2021-03-18 NOTE — Progress Notes (Signed)
    SUBJECTIVE:   CHIEF COMPLAINT / HPI:   Pruritic cyst between eyes: Patient is a 75 year old female that presents for concern of a "cyst" on her forehead which is itchy.  She states this lesion has been present for about a year ago.  She states it "started like a boil".  It initially did have some purulent drainage which she states it smelled bad.  She states that about a year ago she did try to lance it with a needle and some whitish material came out but it did not fully resolve.  She states it seems to change in size and sometimes is larger, sometimes a smaller.  It does not really hurt but she states it is itchy.  PERTINENT  PMH / PSH: None relevant  OBJECTIVE:   BP (!) 141/61   Pulse 63   Wt 188 lb 6.4 oz (85.5 kg)   SpO2 95%   BMI 28.65 kg/m    General: NAD, pleasant, able to participate in exam Derm: 3-4 cm freely movable nodular area between the patient's eyes.  There is no fluctuance palpated and no obvious drainage.  There is a single hyperpigmented lesion in a state of healing present in the center of this which she states is where she previously tried to lance it, there is no drainage from this area currently.  This was not painful to palpate for the patient.      ASSESSMENT/PLAN:   Inclusion cyst Assessment: 75 year old female with large cystic-like area between her eyes.  This area previously did have purulent drainage and she previously tried to lance it with some whitish material which came out.  It is not painful to palpate but is pruritic at times.  Seems to increase and decrease in size.  It is freely mobile on physical exam.  Differential can include sebaceous/inclusion cyst, less likely lipoma as the lesion is not soft and the purulent drainage in the past suggest more of a cystic structure. Plan: -Because of the location of this lesion we recommended an incision to remove it, however due to location recommend a referral to general or plastic surgery in order  for this to occur.  Upon discussing with the patient she would prefer referral for plastic surgery.  We will place this referral.    Lurline Del, [place]    This note was prepared using Dragon voice recognition software and may include unintentional dictation errors due to the inherent limitations of voice recognition software.

## 2021-03-23 ENCOUNTER — Other Ambulatory Visit: Payer: Self-pay

## 2021-03-23 DIAGNOSIS — Z96641 Presence of right artificial hip joint: Secondary | ICD-10-CM

## 2021-03-23 DIAGNOSIS — G894 Chronic pain syndrome: Secondary | ICD-10-CM

## 2021-03-24 MED ORDER — HYDROCODONE-ACETAMINOPHEN 5-325 MG PO TABS: 1.0000 | ORAL_TABLET | Freq: Four times a day (QID) | ORAL | 0 refills | Status: DC | PRN

## 2021-03-24 NOTE — Telephone Encounter (Signed)
Called patient RE: pain   Hip and shoulder pain, chronic  Taking two pills per day. She reports increased pain at this time. She is not having any excessive drowsiness, no falls.  - sending in #15 Norco as Dr. Oralia Rud plan at last visit was to increase to #45. Patient should follow up with Dr. Tarry Kos as planned to further discuss pain management.   Also having gout flair in b/l great toes. Taking allopurinol and colchicine. Patient reports 1-2 gout flares a year. Denies any fevers, chills. Started colchicine yesterday. 1 pill yesterday, once this morning.  - continue colchicine  - continue allourinol during flare  - consider prednisone if worsening   Wilber Oliphant, M.D.  1:53 PM 03/24/2021

## 2021-04-04 NOTE — Progress Notes (Signed)
Subjective:   Patient ID: Donna Herrera    DOB: May 19, 1946, 75 y.o. female   MRN: [id number]  Donna Herrera is a 75 y.o. female with a history of chronic combined systolic and diastolic CHF, hypertension, and ICM, chronic lung disease, emphysema, pulmonary fibrosis, GERD, cervical degenerative disc disease, DJD, rotator cuff arthropathy, edematous polyp, chronic pain, history of gout, history of tobacco abuse, hyperlipidemia, prediabetes, tension headache, vitamin D deficiency here for mouth discomfort and gout  HPI: 1. Sore on top of mouth: She notes family history of throat cancer in her son who has a smoking history. She noted a "bump" on the top of her mouth x 2 weeks ago. It occasionally hurts. Denies any discharge or bleeding. Denies any known trauma. Denies any recent dental procedures. She had most of her teeth removed a while back but has not been able to get the dentures since then. Denies any pain in her gums or signs of infections. Has been treating with mouth wash and does feel like its improving.   2. Bilateral foot pain primarily when she stands on them for a long period to time. Denies any trauma. Denies any swelling. Hurts all over. Notes that she hasnt bought new shoes in a long time.   Review of Systems:  Per HPI.   Objective:   BP 119/69   Pulse 70   Wt 182 lb 6.4 oz (82.7 kg)   SpO2 96%   BMI 27.73 kg/m  Vitals and nursing note reviewed.  General: pleasant older female, leaning comfortably on exam bed, well nourished, well developed, in no acute distress with non-toxic appearance HEENT: normocephalic, atraumatic, moist mucous membranes, oropharynx clear without erythema or exudate, small ulcerative (~0.1cm) lesion at posterior left roof of mouth, no overlying or surrounding erythema, no discharge, remaining gums appear healthy Resp: breathing comfortably on room air, speaking in full sentences Skin: warm, dry, Extremities: warm and well perfused,  normal tone, no LE swelling MSK: ankles, midfoot, and toes bilaterally with normal ROM, 5/5 strength bilaterally, hallux valgus bilaterally Neuro: Alert and oriented, speech normal  Assessment & Plan:   Pain in both feet Chronic, intermittent. Describes pain as diffuse, particularly when standing for long periods of time. Improvement with rest. Has history of gout but this appears unrelated. Possibly exacerbated with metatarsalgia bilaterally. Suspect related to poorly supportive shoes. Recommended supportive shoes. Increase Gapabentin to 350m qHS. Capsaicin cream. Follow up if worsening and we can consider x-ray and podiatry referral.  Encounter for chronic pain management She was given partial refill (#15 tablets) on 03/24/21. Will provide remaining #15 tablets today. She is usually prescribed #30 per month. She has been needing to use more often to help maintain ADL's. She is limited to tylenol due to her heart failure. Discussed increasing to #45 tablets in future for extra relief however last discussion we opted to wait to see if scheduling tylenol and increasing Gabapentin would help. Could continue to titrate this up.   Sore in mouth Acute. Very small ulcerative like lesion on left roof of mouth. No history of HSV. Possibly traumatic vs aphthous ulcer. She does endorse improvement.  Recommended Chlorhexidine mouth wash BID x 1-2 weeks. If no improvement or worsening, follow up. Consider HSV culture vs referral to dentist/oral surgeon for further evaluation. She voiced understanding and agreement with plan.  No orders of the defined types were placed in this encounter.  Meds ordered this encounter  Medications   chlorhexidine gluconate, MEDLINE KIT, (PERIDEX) 0.12 % solution  Sig: Use as directed 15 mLs in the mouth or throat 2 (two) times daily.    Dispense:  1893 mL    Refill:  0   HYDROcodone-acetaminophen (NORCO/VICODIN) 5-325 MG tablet    Sig: Take 1 tablet by mouth every 6  (six) hours as needed for moderate pain.    Dispense:  15 tablet    Refill:  0      Mina Marble, DO PGY-3, [facility] 04/10/2021 8:56 PM

## 2021-04-08 ENCOUNTER — Ambulatory Visit (INDEPENDENT_AMBULATORY_CARE_PROVIDER_SITE_OTHER): Payer: Medicare Other | Admitting: Family Medicine

## 2021-04-08 ENCOUNTER — Encounter: Payer: Self-pay | Admitting: Family Medicine

## 2021-04-08 ENCOUNTER — Other Ambulatory Visit: Payer: Self-pay

## 2021-04-08 VITALS — BP 119/69 | HR 70 | Wt 182.4 lb

## 2021-04-08 DIAGNOSIS — G8929 Other chronic pain: Secondary | ICD-10-CM | POA: Diagnosis not present

## 2021-04-08 DIAGNOSIS — G894 Chronic pain syndrome: Secondary | ICD-10-CM | POA: Diagnosis not present

## 2021-04-08 DIAGNOSIS — M79672 Pain in left foot: Secondary | ICD-10-CM | POA: Diagnosis not present

## 2021-04-08 DIAGNOSIS — Z96641 Presence of right artificial hip joint: Secondary | ICD-10-CM

## 2021-04-08 DIAGNOSIS — K1379 Other lesions of oral mucosa: Secondary | ICD-10-CM | POA: Diagnosis not present

## 2021-04-08 DIAGNOSIS — M79671 Pain in right foot: Secondary | ICD-10-CM | POA: Diagnosis not present

## 2021-04-08 MED ORDER — CHLORHEXIDINE GLUCONATE 0.12% ORAL RINSE (MEDLINE KIT): 15.0000 mL | Freq: Two times a day (BID) | OROMUCOSAL | 0 refills | Status: DC

## 2021-04-08 NOTE — Patient Instructions (Signed)
Foot pain: Wear supportive shoes Increase Gabapentin to 300mg  at night Capsaicin cream Follow up if worsening and we can consider x-ray and podiatry referral   Mouth: Use mouthwash twice a day for 2 weeks. Follow up if persists or worsens and we will consider referral to dentist/oral surgeon for further evaluation

## 2021-04-10 DIAGNOSIS — K1379 Other lesions of oral mucosa: Secondary | ICD-10-CM | POA: Insufficient documentation

## 2021-04-10 MED ORDER — HYDROCODONE-ACETAMINOPHEN 5-325 MG PO TABS: 1.0000 | ORAL_TABLET | Freq: Four times a day (QID) | ORAL | 0 refills | Status: DC | PRN

## 2021-04-10 NOTE — Assessment & Plan Note (Signed)
Acute. Very small ulcerative like lesion on left roof of mouth. No history of HSV. Possibly traumatic vs aphthous ulcer. She does endorse improvement.  Recommended Chlorhexidine mouth wash BID x 1-2 weeks. If no improvement or worsening, follow up. Consider HSV culture vs referral to dentist/oral surgeon for further evaluation. She voiced understanding and agreement with plan.

## 2021-04-10 NOTE — Assessment & Plan Note (Signed)
Chronic, intermittent. Describes pain as diffuse, particularly when standing for long periods of time. Improvement with rest. Has history of gout but this appears unrelated. Possibly exacerbated with metatarsalgia bilaterally. Suspect related to poorly supportive shoes. Recommended supportive shoes. Increase Gapabentin to 300mg  qHS. Capsaicin cream. Follow up if worsening and we can consider x-ray and podiatry referral.

## 2021-04-10 NOTE — Assessment & Plan Note (Signed)
She was given partial refill (#15 tablets) on 03/24/21. Will provide remaining #15 tablets today. She is usually prescribed #30 per month. She has been needing to use more often to help maintain ADL's. She is limited to tylenol due to her heart failure. Discussed increasing to #45 tablets in future for extra relief however last discussion we opted to wait to see if scheduling tylenol and increasing Gabapentin would help. Could continue to titrate this up.

## 2021-04-14 ENCOUNTER — Other Ambulatory Visit: Payer: Self-pay

## 2021-04-14 DIAGNOSIS — G894 Chronic pain syndrome: Secondary | ICD-10-CM

## 2021-04-14 DIAGNOSIS — Z96641 Presence of right artificial hip joint: Secondary | ICD-10-CM

## 2021-04-14 NOTE — Telephone Encounter (Signed)
Already sent in refill

## 2021-05-10 ENCOUNTER — Other Ambulatory Visit: Payer: Self-pay | Admitting: Family Medicine

## 2021-05-10 DIAGNOSIS — M1A09X0 Idiopathic chronic gout, multiple sites, without tophus (tophi): Secondary | ICD-10-CM

## 2021-05-12 ENCOUNTER — Other Ambulatory Visit: Payer: Self-pay | Admitting: Family Medicine

## 2021-05-13 ENCOUNTER — Ambulatory Visit: Payer: Medicare Other | Admitting: Plastic Surgery

## 2021-05-13 ENCOUNTER — Other Ambulatory Visit: Payer: Self-pay

## 2021-05-13 VITALS — BP 144/71 | HR 62 | Ht 67.0 in | Wt 187.0 lb

## 2021-05-13 DIAGNOSIS — L723 Sebaceous cyst: Secondary | ICD-10-CM | POA: Diagnosis not present

## 2021-05-13 NOTE — Progress Notes (Signed)
Referring Provider Pray, Norwood Levo, MD [place],  [place] [address]   CC:  Chief Complaint  Patient presents with   consult      Donna Herrera is an 75 y.o. female.  HPI: Patient presents with a cystic lesion in the glabellar area.  She says has been present for at least a year.  She did try to pop it herself with a pen at 1 point and some foul-smelling thick substance was extruded but she felt like it got worse after that.  She would like to have it removed.  She does believe it is growing in size.  Allergies  Allergen Reactions   Crab [Shellfish Allergy]     Itching to lips   Morphine And Related Itching and Swelling    Outpatient Encounter Medications as of 05/13/2021  Medication Sig   albuterol (PROVENTIL HFA;VENTOLIN HFA) 108 (90 Base) MCG/ACT inhaler Inhale 1-2 puffs into the lungs every 6 (six) hours as needed for wheezing.   allopurinol (ZYLOPRIM) 100 MG tablet TAKE 1 TABLET BY MOUTH EVERY DAY   aspirin 81 MG tablet Take 1 tablet (81 mg total) by mouth daily.   atorvastatin (LIPITOR) 40 MG tablet Take 1 tablet (40 mg total) by mouth daily.   baclofen (LIORESAL) 10 MG tablet TAKE 1 TABLET BY MOUTH THREE TIMES A DAY   Blood Glucose Monitoring Suppl (ONETOUCH VERIO) w/Device KIT Please use to check blood sugar once daily. E11.9   carvedilol (COREG) 3.125 MG tablet Take 1 tablet (3.125 mg total) by mouth 2 (two) times daily.   cephALEXin (KEFLEX) 500 MG capsule Take 1 capsule (500 mg total) by mouth 4 (four) times daily.   chlorhexidine gluconate, MEDLINE KIT, (PERIDEX) 0.12 % solution Use as directed 15 mLs in the mouth or throat 2 (two) times daily.   colchicine 0.6 MG tablet Take 1 tablet (0.6 mg total) by mouth daily.   furosemide (LASIX) 20 MG tablet TAKE 20 MG TABLET BY MOUTH ON Monday, WED, FRI   gabapentin (NEURONTIN) 100 MG capsule Take 2 capsules (200 mg total) by mouth at bedtime.   glucose blood (ONETOUCH VERIO) test strip Please use to  check blood sugar once daily. E11.9   hydrALAZINE (APRESOLINE) 50 MG tablet Take 1 tablet (50 mg total) by mouth 3 (three) times daily.   HYDROcodone-acetaminophen (NORCO/VICODIN) 5-325 MG tablet Take 1 tablet by mouth every 6 (six) hours as needed for moderate pain.   Lancet Devices (ONE TOUCH DELICA LANCING DEV) MISC Please use to check blood sugar once daily. H41.9   OneTouch Delica Lancets 37T MISC Please use to check blood sugar once daily. E11.9   Potassium Chloride ER 20 MEQ TBCR TAKE 1 TABLET BY MOUTH AS NEEDED (WHEN TAKING LASIX).   spironolactone (ALDACTONE) 25 MG tablet TAKE 1/2 TABLET BY MOUTH EVERY DAY   triamcinolone ointment (KENALOG) 0.5 % Apply 1 application topically 2 (two) times daily.   No facility-administered encounter medications on file as of 05/13/2021.     Past Medical History:  Diagnosis Date   Allergy    Arthritis    Candidiasis, mouth 10/19/2017   Chronic combined systolic and diastolic CHF (congestive heart failure) ([place]) 10/21/2015   Echo 3/16: EF 40%, diffuse HK, mild MR, moderate LAE, mild RAE, PASP 42 mmHg  //  Echo 12/16: Mild LVH, EF 35-40%, diffuse HK, Gr 2 DD, MAC, mild MR, mod LAE, normal RVSF, PASP 49 mmHg // Echo 5/19: EF 15-20, diff HK, Gr 2  DD, trivial AI, MAC, trivial MR, mod LAE, normal RVSF, mild TR, PASP 62 // Limited Echo 8/19: mod LVH, EF 40-45, diff HK, Gr 1 DD, MAC, mild LAE    Chronic left shoulder pain 04/10/2013   Chronic lung disease    Chronic pain    Chronic pain syndrome 07/20/2017   Depression 08/25/2016   DJD (degenerative joint disease) 10/12/2012   Multiple joint replacements by Dr.  Salina.    Dyshidrotic eczema 12/31/2014   Essential hypertension 07/20/2017   GERD (gastroesophageal reflux disease)    Gout    H/O slipped capital femoral epiphysis (SCFE)    History of total right hip replacement 04/02/2019   Followed closely with orthopedist Dr. Alvan Dame. Patient is s/p total right hip replacement in 1980's with evidence of bone  loss and osteopenia making reconstruction unlikely. Plan for conservative treatment at this time with crutches for ambulation and pain control.   Hyperlipidemia    Hypertension    Hypertensive heart disease with CHF (congestive heart failure) ([place]) 10/12/2012   Loss of taste 02/06/2018   Mass of breast, left 07/21/2014   NICM (nonischemic cardiomyopathy) ([place]) 06/09/2016   A. LHC 7/17: Normal coronary arteries, EF 30-35%, LVEDP 14 mmHg   Pain in gums 04/18/2018   Pre-diabetes    Right hip pain 11/02/2012   Tendonitis, Achilles, left 05/24/2017   Tobacco abuse 09/29/2014   Vitamin D deficiency 12/31/2014    Past Surgical History:  Procedure Laterality Date   CARDIAC CATHETERIZATION  2005   normal-[place]   CARDIAC CATHETERIZATION N/A 05/11/2016   Procedure: Right/Left Heart Cath and Coronary Angiography;  Surgeon: Peter M Martinique, MD;  Location: [facility];  Service: Cardiovascular;  Laterality: N/A;   CESAREAN SECTION     COLONOSCOPY  1990's ??   HIP FUSION Left    left    JOINT REPLACEMENT Bilateral 1991, 1997   [place]   ROTATOR CUFF REPAIR     bilateral   TOOTH EXTRACTION N/A 04/03/2020   Procedure: DENTAL RESTORATION/EXTRACTIONS;  Surgeon: [name], DDS;  Location: [facility];  Service: Oral Surgery;  Laterality: N/A;   TOTAL HIP ARTHROPLASTY  1987   right. Dr.  Salina   TUBAL LIGATION      Family History  Problem Relation Age of Onset   Depression Mother    Diabetes Mother    Hypertension Mother    Stroke Mother    Heart disease Mother    Cancer Father        unknown primary   Diabetes Sister    Stomach cancer Brother    Throat cancer Son    Breast cancer Neg Hx    Esophageal cancer Neg Hx    Colon cancer Neg Hx    Colon polyps Neg Hx     Social History   Social History Narrative   Formerly Photographer. 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     Review of Systems General: Denies fevers, chills, weight loss CV: Denies chest pain,  shortness of breath, palpitations  Physical Exam Vitals with BMI 05/13/2021 04/08/2021 03/18/2021  Height _0  - -  Weight 187 lbs 182 lbs 6 oz 188 lbs 6 oz  BMI 29.28 80.99 83.38  Systolic 250 539 767  Diastolic 71 69 61  Pulse 62 70 63    General:  No acute distress,  Alert and oriented, Non-Toxic, Normal speech and affect Patient has 2 cm cystic lesion in the glabellar area.  It feels freely mobile.  There does seem to be a smaller distinct lesion just superior to it.  There does look to be a punctum in the inferior aspect closer to the nasal dorsum.  Assessment/Plan Patient has what I suspect is a sebaceous cyst in the glabellar area.  We discussed excision.  We discussed the risk that include bleeding, infection, damage to surrounding structures and need for additional procedures.  All of her questions were answered and we will plan to move forward with excision in the office.  Cindra Presume 05/13/2021, 11:36 AM

## 2021-05-19 ENCOUNTER — Other Ambulatory Visit: Payer: Self-pay

## 2021-05-19 DIAGNOSIS — Z96641 Presence of right artificial hip joint: Secondary | ICD-10-CM

## 2021-05-19 DIAGNOSIS — G894 Chronic pain syndrome: Secondary | ICD-10-CM

## 2021-05-24 NOTE — Telephone Encounter (Signed)
Patient calls nurse line to check status of rx refill.   Please advise.   Talbot Grumbling, RN

## 2021-05-25 MED ORDER — HYDROCODONE-ACETAMINOPHEN 5-325 MG PO TABS: 1.0000 | ORAL_TABLET | Freq: Four times a day (QID) | ORAL | 0 refills | Status: DC | PRN

## 2021-05-25 NOTE — Telephone Encounter (Signed)
Patient calls nurse line to check status of rx refill. Patient expresses frustration as she requested medication on 6/20. Patient is tearful on VM as she is now out of medication.   Spoke with Dr. Nori Riis regarding patient. Will route rx request to attending.   Talbot Grumbling, RN

## 2021-05-31 ENCOUNTER — Other Ambulatory Visit: Payer: Self-pay | Admitting: Internal Medicine

## 2021-05-31 DIAGNOSIS — I428 Other cardiomyopathies: Secondary | ICD-10-CM

## 2021-05-31 DIAGNOSIS — I5042 Chronic combined systolic (congestive) and diastolic (congestive) heart failure: Secondary | ICD-10-CM

## 2021-06-02 ENCOUNTER — Inpatient Hospital Stay: Admission: RE | Admit: 2021-06-02 | Payer: Medicare Other | Source: Ambulatory Visit

## 2021-06-08 ENCOUNTER — Ambulatory Visit: Payer: Medicare Other

## 2021-06-08 ENCOUNTER — Other Ambulatory Visit: Payer: Self-pay

## 2021-06-08 ENCOUNTER — Ambulatory Visit (INDEPENDENT_AMBULATORY_CARE_PROVIDER_SITE_OTHER): Payer: Medicare Other | Admitting: Family Medicine

## 2021-06-08 VITALS — BP 153/75 | HR 57 | Wt 180.4 lb

## 2021-06-08 DIAGNOSIS — M2012 Hallux valgus (acquired), left foot: Secondary | ICD-10-CM | POA: Diagnosis not present

## 2021-06-08 DIAGNOSIS — I5042 Chronic combined systolic (congestive) and diastolic (congestive) heart failure: Secondary | ICD-10-CM

## 2021-06-08 DIAGNOSIS — M2011 Hallux valgus (acquired), right foot: Secondary | ICD-10-CM | POA: Diagnosis not present

## 2021-06-08 DIAGNOSIS — M25551 Pain in right hip: Secondary | ICD-10-CM | POA: Diagnosis not present

## 2021-06-08 DIAGNOSIS — R682 Dry mouth, unspecified: Secondary | ICD-10-CM

## 2021-06-08 DIAGNOSIS — E785 Hyperlipidemia, unspecified: Secondary | ICD-10-CM | POA: Diagnosis not present

## 2021-06-08 DIAGNOSIS — R202 Paresthesia of skin: Secondary | ICD-10-CM

## 2021-06-08 DIAGNOSIS — R410 Disorientation, unspecified: Secondary | ICD-10-CM

## 2021-06-08 DIAGNOSIS — I428 Other cardiomyopathies: Secondary | ICD-10-CM

## 2021-06-08 DIAGNOSIS — R42 Dizziness and giddiness: Secondary | ICD-10-CM | POA: Diagnosis not present

## 2021-06-08 DIAGNOSIS — R109 Unspecified abdominal pain: Secondary | ICD-10-CM | POA: Diagnosis not present

## 2021-06-08 DIAGNOSIS — R200 Anesthesia of skin: Secondary | ICD-10-CM | POA: Diagnosis not present

## 2021-06-08 HISTORY — DX: Disorientation, unspecified: R41.0

## 2021-06-08 LAB — POCT URINALYSIS DIP (MANUAL ENTRY)
Bilirubin, UA: NEGATIVE
Blood, UA: NEGATIVE
Glucose, UA: NEGATIVE mg/dL
Ketones, POC UA: NEGATIVE mg/dL
Leukocytes, UA: NEGATIVE
Nitrite, UA: NEGATIVE
Protein Ur, POC: NEGATIVE mg/dL
Spec Grav, UA: 1.02 (ref 1.010–1.025)
Urobilinogen, UA: 0.2 E.U./dL
pH, UA: 5.5 (ref 5.0–8.0)

## 2021-06-08 MED ORDER — CARVEDILOL 3.125 MG PO TABS: 3.1250 mg | ORAL_TABLET | Freq: Two times a day (BID) | ORAL | 1 refills | Status: DC

## 2021-06-08 MED ORDER — SPIRONOLACTONE 25 MG PO TABS: 12.5000 mg | ORAL_TABLET | Freq: Every day | ORAL | 3 refills | Status: DC

## 2021-06-08 NOTE — Progress Notes (Addendum)
SUBJECTIVE:   CHIEF COMPLAINT / HPI:   Dizziness, lightheadedness, headache - started yesterday - no nausea, vomiting - frontal headache - thirsty all the time, dry mouth - takes lasix M/W/F - no new med changes? : cardiologist Dr. Harrington Challenger at [facility] (last visit Dec. 10) - has run out of carvedilol (has only a couple pills left?) and spironolactone (out 2 weeks?)  Chronic problems: - hands and feet get numb and burning pain "for a while" - trouble with hip and leg since little girl  PERTINENT  PMH / PSH: Chronic combined systolic and diastolic CHF, nonischemic cardiomyopathy, process, emphysema, GERD, degenerative joint disease, HLD, prediabetes  OBJECTIVE:   BP (!) 153/75   Pulse (!) 57   Wt 180 lb 6.4 oz (81.8 kg) Comment: Standing on scale without crutches  SpO2 99%   BMI 28.25 kg/m    PHQ-9:  Depression screen [facility] 2/9 04/08/2021 03/11/2021 02/09/2021  Decreased Interest 2 0 0  Down, Depressed, Hopeless 2 0 0  PHQ - 2 Score 4 0 0  Altered sleeping 1 0 0  Tired, decreased energy 1 0 0  Change in appetite 2 0 0  Feeling bad or failure about yourself  1 0 0  Trouble concentrating 0 0 0  Moving slowly or fidgety/restless 0 0 0  Suicidal thoughts 0 0 0  PHQ-9 Score 9 0 0  Some recent data might be hidden     GAD-7: No flowsheet data found.   Physical Exam General: Awake, alert, conversational pleasant HEENT: Dry oral mucosa Cardiovascular: Regular rate and rhythm, S1 and S2 present, no murmurs auscultated, brisk cap refill Respiratory: Lung fields clear to auscultation bilaterally MSK: Exquisite tenderness to palpation of right hip over ischial crest, suprapubic area, femoral head Extremities: No bilateral lower extremity edema, palpable pedal and pretibial pulses bilaterally, hallux valgus of left great toe  ASSESSMENT/PLAN:   Dizziness Suspect orthostatic in nature due to possible overdiuresis in setting of confusion regarding medications.   Weight today 180.4 pounds, down from 187 a month ago.  Dry oral mucosa.  Patient able to tell me about the proper administration of her Lasix and spironolactone; however has some confusion regarding how long she has been out.  Per patient, she has been running low on pills and so has not been able to take spironolactone every day as prescribed.  No other diuretics reported.  Unable to complete thorough med reconciliation due to patient confusion. Last saw Dr. Harrington Challenger, cardiology, 10/09/2020.  - Hold spironolactone - Hold Wednesday dose of Lasix (taken MWF) - Patient to contact cardiologist for further guidance and make follow-up appointment  Confusion Patient confusion regarding medication list and how long she has been out of certain medications.  See dizziness A&P above (06/08/2021 visit) for more details.  Concerned for patient's management of her medications and medical conditions.  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 -Appointment with Marzetta Merino, [facility], for thorough med rec and look for subtle potential interactions between prescriptions and OTC meds - Appointment [facility] (Dr. McDiarmid) for cognitive assessment and possible connection with community resources and home health services.   Hallux valgus (acquired), left foot Referred to podiatry.  Hip pain, right Suspect worsening of chronic right hip pain.  Patient s/p right hip replacement 1987?  Subsequent evidence of osteopenia.  Reconstruction unlikely.  Follows with orthopedic office, Dr. Alvan Dame.  Encouraged her to reach out to orthopedist.  We will continue with conservative treatment at this time including pain medication and assistive devices such as crutches and walkers.  Numbness and tingling of both feet Subacute, has been worsening over several months.  Given focus of this same day appointment is her dizziness and we were already collecting labs, obtained B12 and  folate labs today.  Plan for follow-up to discuss numbness and tingling further.  Patient with known diagnosis of prediabetes, last A1c 6.0 02/09/2021.     Ezequiel Essex, MD [place]

## 2021-06-08 NOTE — Patient Instructions (Addendum)
It was wonderful to meet you today. Thank you for allowing me to be a part of your care. Below is a short summary of what we discussed at your visit today:  Dizziness and dry mouth -You are down 6 and half pounds from last time we saw you -Today we got blood work on you.If the results are normal, I will send you a letter or MyChart message. If the results are abnormal, I will give you a call.   - Hold your spironolactone until directed to restart by your cardiologist - Skip your Wednesday Lasix dose and your potassium dose - Call your cardiologist for directions for medications after Wednesday - Make an appointment with a cardiologist as soon as they can fit you in  Flank pain -Your urinalysis was negative for UTI - I believe this pain is referred from your right hip - I want you to go back to see orthopedist about this right hip and side pain  Family medicine center pharmacist -I will ask our clinical pharmacist, Dr. Marzetta Merino, to make an appoint with you to go over all your medicines.   -You will see her on 8/18 at 9 AM. Please bring all your medications to this appointment.  Geriatric clinic with Dr. Wendy Poet - I would like you to be seen at our geriatric clinic for full evaluation.  This will include a full review of your medications along with your daily functioning and cognition.  This way, if you need a little extra help we can connect you with community services and home health agencies.  Podiatry -I have referred you to a podiatrist (foot doctor).  If you do not hear from them in a week, let us know.   Please bring all of your medications to every appointment!  If you have any questions or concerns, please do not hesitate to contact us via phone or MyChart message.   Ezequiel Essex, MD

## 2021-06-08 NOTE — Assessment & Plan Note (Signed)
Patient confusion regarding medication list and how long she has been out of certain medications.  See dizziness A&P above (06/08/2021 visit) for more details.  Concerned for patient's management of her medications and medical conditions.  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 -Appointment with Marzetta Merino, [facility], for thorough med rec and look for subtle potential interactions between prescriptions and OTC meds - Appointment with [facility] geriatric clinic (Dr. McDiarmid) for cognitive assessment and possible connection with community resources and home health services.

## 2021-06-08 NOTE — Progress Notes (Signed)
Please set up appt to be seen by me or by APP   Problems with confusion over meds

## 2021-06-08 NOTE — Assessment & Plan Note (Signed)
Suspect worsening of chronic right hip pain.  Patient s/p right hip replacement 1987?  Subsequent evidence of osteopenia.  Reconstruction unlikely.  Follows with orthopedic office, Dr. Alvan Dame.  Encouraged her to reach out to orthopedist.  We will continue with conservative treatment at this time including pain medication and assistive devices such as crutches and walkers.

## 2021-06-08 NOTE — Progress Notes (Signed)
Received message from front office staff regarding patient. Patient was late to appointment and was told that she could be seen at the end of provider's clinic. Patient told front office that she wanted to reschedule and leave. [place] office staff was concerned as patient was complaining of dizziness.   Assisted patient to exam room for further evaluation. Patient reports onset of dizziness yesterday afternoon while she was gardening. Of note, patient also reports headache at 6/10.   Patient denies body aches, sore throat or cough.   Asked patient to sit in chair in exam room. Patient states that chair is too low to the ground and would rather sit on the end of the exam table. Explained the risk for fall due to dizziness. Patient states that she will be fine sitting on the table. Granddaughter in the room with patient.   Vitals obtained at 1042. See flow sheet.   Talbot Grumbling, RN

## 2021-06-08 NOTE — Assessment & Plan Note (Signed)
Referred to podiatry.

## 2021-06-08 NOTE — Assessment & Plan Note (Addendum)
Subacute, has been worsening over several months.  Given focus of this same day appointment is her dizziness and we were already collecting labs, obtained B12 and folate labs today.  Plan for follow-up to discuss numbness and tingling further.  Patient with known diagnosis of prediabetes, last A1c 6.0 02/09/2021.

## 2021-06-08 NOTE — Assessment & Plan Note (Signed)
Suspect orthostatic in nature due to possible overdiuresis in setting of confusion regarding medications.  Weight today 180.4 pounds, down from 187 a month ago.  Dry oral mucosa.  Patient able to tell me about the proper administration of her Lasix and spironolactone; however has some confusion regarding how long she has been out.  Per patient, she has been running low on pills and so has not been able to take spironolactone every day as prescribed.  No other diuretics reported.  Unable to complete thorough med reconciliation due to patient confusion. Last saw Dr. Harrington Challenger, cardiology, 10/09/2020.  - Hold spironolactone - Hold Wednesday dose of Lasix (taken MWF) - Patient to contact cardiologist for further guidance and make follow-up appointment

## 2021-06-09 LAB — CBC
Hematocrit: 42.1 % (ref 34.0–46.6)
Hemoglobin: 13.3 g/dL (ref 11.1–15.9)
MCH: 28 pg (ref 26.6–33.0)
MCHC: 31.6 g/dL (ref 31.5–35.7)
MCV: 89 fL (ref 79–97)
Platelets: 209 10*3/uL (ref 150–450)
RBC: 4.75 x10E6/uL (ref 3.77–5.28)
RDW: 13.1 % (ref 11.7–15.4)
WBC: 5.7 10*3/uL (ref 3.4–10.8)

## 2021-06-09 LAB — BASIC METABOLIC PANEL
BUN/Creatinine Ratio: 17 (ref 12–28)
BUN: 15 mg/dL (ref 8–27)
CO2: 22 mmol/L (ref 20–29)
Calcium: 7.2 mg/dL — ABNORMAL LOW (ref 8.7–10.3)
Chloride: 100 mmol/L (ref 96–106)
Creatinine, Ser: 0.89 mg/dL (ref 0.57–1.00)
Glucose: 89 mg/dL (ref 65–99)
Potassium: 4.7 mmol/L (ref 3.5–5.2)
Sodium: 141 mmol/L (ref 134–144)
eGFR: 68 mL/min/{1.73_m2} (ref >59)

## 2021-06-09 LAB — LDL CHOLESTEROL, DIRECT: LDL Direct: 174 mg/dL — ABNORMAL HIGH (ref 0–99)

## 2021-06-09 LAB — HEMOGLOBIN A1C
Est. average glucose Bld gHb Est-mCnc: 126 mg/dL
Hgb A1c MFr Bld: 6 % — ABNORMAL HIGH (ref 4.8–5.6)

## 2021-06-09 LAB — FOLATE: Folate: 19.8 ng/mL (ref >3.0)

## 2021-06-09 LAB — VITAMIN B12: Vitamin B-12: 461 pg/mL (ref 232–1245)

## 2021-06-10 ENCOUNTER — Other Ambulatory Visit: Payer: Self-pay | Admitting: Family Medicine

## 2021-06-11 ENCOUNTER — Encounter: Payer: Self-pay | Admitting: Family Medicine

## 2021-06-11 ENCOUNTER — Telehealth: Payer: Self-pay | Admitting: Family Medicine

## 2021-06-11 DIAGNOSIS — E785 Hyperlipidemia, unspecified: Secondary | ICD-10-CM

## 2021-06-11 MED ORDER — ATORVASTATIN CALCIUM 40 MG PO TABS: 40.0000 mg | ORAL_TABLET | Freq: Every day | ORAL | 3 refills | Status: DC

## 2021-06-11 NOTE — Telephone Encounter (Signed)
Call patient to discuss lab results.  Patient verified birthday.  Patient included her son-in-law on the phone for discussion.  Calcium low at 7.4.  Unknown albumin, however previous albumin's within normal limits.  Recommended patient increased dietary intake of calcium and also take Tums 2 tablets once daily for 7 days.  LDL high at 174.  Patient confirms that she was fasting at time of lab.  Patient prescribed Lipitor 40 mg daily.  Patient cannot remember if she has been taking this, son-in-law comments "probably not".  I will send refill of Lipitor to pharmacy.  CBC normal-no anemia to explain dizziness B12 and folate normal, A1c at prediabetes baseline-does not explain BLE numbness and tingling UA negative-right hip pain most likely orthopedic in nature, recommended to reach out to her orthopedist office  At appointment last Tuesday, I gathered that her dizziness and dry mouth are most likely due to overdiuresis by inadvertent medication mixup.  Patient reports she has not contacted her cardiologist office yet.  As instructed on Tuesday, she skipped spironolactone Wednesday and Thursday; also skipped her Wednesday Lasix.  Took both today as prescribed.  She will continue to take daily spironolactone and M/W/F Lasix until she hears back from the cardiologist.  Discussed upcoming appointments with both clinical pharmacist and Dr. Arby Barrette.  Patient unsure of why she has to see pharmacist, reports she would rather not and simply see the doctor.  I have canceled the appointment with Marzetta Merino, [facility].  Patient amenable to referral to geri clinic.  Framed it as a way to connect her to more resources.  I gave return precautions to the patient and reminded her of our after-hours line for after hours urgent/emergent questions.  Ezequiel Essex, MD

## 2021-06-15 ENCOUNTER — Ambulatory Visit: Payer: Medicare Other

## 2021-06-16 ENCOUNTER — Other Ambulatory Visit: Payer: Self-pay | Admitting: Family Medicine

## 2021-06-16 DIAGNOSIS — G629 Polyneuropathy, unspecified: Secondary | ICD-10-CM

## 2021-06-17 ENCOUNTER — Ambulatory Visit: Payer: Medicare Other | Admitting: Pharmacist

## 2021-06-18 ENCOUNTER — Other Ambulatory Visit: Payer: Self-pay

## 2021-06-18 ENCOUNTER — Encounter: Payer: Self-pay | Admitting: Family Medicine

## 2021-06-18 ENCOUNTER — Ambulatory Visit (INDEPENDENT_AMBULATORY_CARE_PROVIDER_SITE_OTHER): Payer: Medicare Other | Admitting: Family Medicine

## 2021-06-18 ENCOUNTER — Ambulatory Visit: Payer: Medicare Other | Admitting: Family Medicine

## 2021-06-18 DIAGNOSIS — E785 Hyperlipidemia, unspecified: Secondary | ICD-10-CM | POA: Diagnosis not present

## 2021-06-18 DIAGNOSIS — G894 Chronic pain syndrome: Secondary | ICD-10-CM | POA: Diagnosis not present

## 2021-06-18 DIAGNOSIS — Z96641 Presence of right artificial hip joint: Secondary | ICD-10-CM | POA: Diagnosis not present

## 2021-06-18 MED ORDER — DICLOFENAC SODIUM 1 % EX GEL: 4.0000 g | Freq: Four times a day (QID) | CUTANEOUS | 0 refills | Status: DC

## 2021-06-18 MED ORDER — HYDROCODONE-ACETAMINOPHEN 5-325 MG PO TABS: 1.0000 | ORAL_TABLET | Freq: Four times a day (QID) | ORAL | 0 refills | Status: DC | PRN

## 2021-06-18 MED ORDER — ATORVASTATIN CALCIUM 40 MG PO TABS: 40.0000 mg | ORAL_TABLET | Freq: Every day | ORAL | 3 refills | Status: DC

## 2021-06-18 NOTE — Progress Notes (Signed)
    SUBJECTIVE:   CHIEF COMPLAINT / HPI:   Ms. Theall is a 75 yo who presents to follow up on dizziness and feet numbness. She was seen in the clinic on 8/9 and and checked at last visit such as vitamin B12, folate were normal. Today she states her dizziness has improved. She endorses continued lower extremity pain. Walks with walker and uses crutches. Also endorses chronic right arm numbness which she believes is from using crutches for a long time. States she has a list of exercises at home. Not interested in PT   She requests for her  hydrocodone and statin to be refilled   She also states that she was referred to get her lung CT scan on [address] but when she went they did not have any referral. She requests for me to check on that   States she will call eye doctor to make appointment    OBJECTIVE:   BP 128/68   Pulse 62   Wt 181 lb (82.1 kg)   SpO2 96%   BMI 28.35 kg/m    General: alert, pleasant, NAD CV: RRR no murmurs Resp: CTAB normal WOB GI: soft, non distended  Extremities: no visible lesions. Normal strength and tone.   ASSESSMENT/PLAN:   No problem-specific Assessment & Plan notes found for this encounter.   Lower extremity pain No injury or recent fall. More of a constant aching. Likely arthritis contributing. Patient uses crutches and a walker. Sent in Voltaren gel for arthritic pain. Declines PT. Was previously provided with list of exercises and encouraged her to continue those to increase mobility.   Advised patient that lung CT referral was placed for [place]   Refilled hydrocodone-acetaminophen and atorvastatin. PDMP reviewed and appropriate   [name], [place]

## 2021-06-18 NOTE — Patient Instructions (Addendum)
It was great seeing you today!  I am glad your dizziness has improved! I have sent in voltaren gel to help with your arthritis. Continue doing the exercises you have at home to increase mobility.   I have also refilled your pain medication and cholesterol medication as requested.   [facility] is where your lung CT scan is set to take place. [address] [place], [place], [place] [address]  [phone]  Please check-out at the front desk before leaving the clinic and schedule to follow up with y our PCP in the next 3 months, but if you need to be seen earlier than that for any new issues we're happy to fit you in, just give [name] a call!  Visit Reminders: - Stop by the pharmacy to pick up your prescriptions  - Continue to work on your healthy eating habits and incorporating exercise into your daily life.   Feel free to call with any questions or concerns at any time, at [phone].   Take care,  Dr. [name] [facility]

## 2021-07-12 ENCOUNTER — Other Ambulatory Visit: Payer: Self-pay

## 2021-07-14 ENCOUNTER — Other Ambulatory Visit: Payer: Self-pay

## 2021-07-15 ENCOUNTER — Ambulatory Visit (INDEPENDENT_AMBULATORY_CARE_PROVIDER_SITE_OTHER): Payer: Medicare Other | Admitting: Family Medicine

## 2021-07-15 ENCOUNTER — Other Ambulatory Visit: Payer: Self-pay | Admitting: Family Medicine

## 2021-07-15 ENCOUNTER — Other Ambulatory Visit: Payer: Self-pay

## 2021-07-15 VITALS — BP 168/62 | HR 63 | Ht 67.0 in | Wt 182.1 lb

## 2021-07-15 DIAGNOSIS — Z9181 History of falling: Secondary | ICD-10-CM

## 2021-07-15 DIAGNOSIS — R413 Other amnesia: Secondary | ICD-10-CM

## 2021-07-15 DIAGNOSIS — R200 Anesthesia of skin: Secondary | ICD-10-CM | POA: Diagnosis not present

## 2021-07-15 DIAGNOSIS — M25551 Pain in right hip: Secondary | ICD-10-CM

## 2021-07-15 DIAGNOSIS — K08109 Complete loss of teeth, unspecified cause, unspecified class: Secondary | ICD-10-CM | POA: Diagnosis not present

## 2021-07-15 DIAGNOSIS — Z23 Encounter for immunization: Secondary | ICD-10-CM | POA: Diagnosis not present

## 2021-07-15 DIAGNOSIS — I501 Left ventricular failure: Secondary | ICD-10-CM

## 2021-07-15 DIAGNOSIS — Z79899 Other long term (current) drug therapy: Secondary | ICD-10-CM

## 2021-07-15 DIAGNOSIS — M19211 Secondary osteoarthritis, right shoulder: Secondary | ICD-10-CM

## 2021-07-15 DIAGNOSIS — R6889 Other general symptoms and signs: Secondary | ICD-10-CM

## 2021-07-15 DIAGNOSIS — I10 Essential (primary) hypertension: Secondary | ICD-10-CM | POA: Diagnosis not present

## 2021-07-15 DIAGNOSIS — R911 Solitary pulmonary nodule: Secondary | ICD-10-CM

## 2021-07-15 DIAGNOSIS — Z1331 Encounter for screening for depression: Secondary | ICD-10-CM

## 2021-07-15 DIAGNOSIS — M19212 Secondary osteoarthritis, left shoulder: Secondary | ICD-10-CM

## 2021-07-15 DIAGNOSIS — R202 Paresthesia of skin: Secondary | ICD-10-CM

## 2021-07-15 DIAGNOSIS — M5030 Other cervical disc degeneration, unspecified cervical region: Secondary | ICD-10-CM

## 2021-07-15 DIAGNOSIS — I700 Atherosclerosis of aorta: Secondary | ICD-10-CM | POA: Insufficient documentation

## 2021-07-15 DIAGNOSIS — R94120 Abnormal auditory function study: Secondary | ICD-10-CM

## 2021-07-15 DIAGNOSIS — M169 Osteoarthritis of hip, unspecified: Secondary | ICD-10-CM

## 2021-07-15 DIAGNOSIS — N3941 Urge incontinence: Secondary | ICD-10-CM

## 2021-07-15 DIAGNOSIS — Z604 Social exclusion and rejection: Secondary | ICD-10-CM

## 2021-07-15 HISTORY — DX: Atherosclerosis of aorta: I70.0

## 2021-07-15 MED ORDER — HYDROCODONE-ACETAMINOPHEN 5-325 MG PO TABS: 1.0000 | ORAL_TABLET | Freq: Four times a day (QID) | ORAL | 0 refills | Status: DC | PRN

## 2021-07-15 NOTE — Progress Notes (Signed)
S: Pharmacy consulted to complete medication reconciliation for geriatric clinic. Patient arrives in good spirits accompanied by her granddaughter Sharia Reeve and great granddaughter Elyse Hsu. Medication reconciliation completed by patient with occasional assistance from family. She is familiar with the medications she takes and can tell us correctly how she takes it. Her medication and allergy list is now accurate and up to date.   Medication Issues Identified: 1. She is only taking atorvastatin once weekly (or even more rarely) due to it causing her joints to hurt.  2. She has a couple bottles of medications that she is no longer taking and would like [name] to dispose of them. These are: fluoxetine 20 mg and hydralazine 10 mg. She no longer takes fluoxetine, and hydralazine has since been increased to 50 mg.    Plan: 1. Consider trial of alternative statin that may cause less muscle/joint pain such as rosuvastatin. Last LDL from 06/08/21 was 174.  2. With the patient's permission, we have disposed of the bottles of fluoxetine and hydralazine.    Patient seen by: Meyer Russel, PharmD Candidate, and Rebbeca Paul, PharmD, PGY2 Pharmacy Resident

## 2021-07-15 NOTE — Patient Instructions (Signed)
Mild Neurocognitive Disorder Mild neurocognitive disorder, formerly known as mild cognitive impairment, is a disorder in which memory does not work as well as it should. This disorder may also cause problems with other mental functions, including thought, communication, behavior, and completion of tasks. These problems can be noticed and measured, but they usually do not interfere with daily activities or the ability to live independently. Mild neurocognitive disorder typically develops after 76 years of age, but it can also develop at younger ages. It is not as serious as major neurocognitive disorder, also known as dementia, but it may be the first sign of it. Generally, symptoms of this condition get worse over time. In rare cases, symptoms can get better. What are the causes? This condition may be caused by: Brain disorders like Alzheimer's disease, Parkinson's disease, and other conditions that gradually damage nerve cells (neurodegenerative conditions). Diseases that affect blood vessels in the brain and result in small strokes. Certain infections, such as HIV. Traumatic brain injury. Other medical conditions, such as brain tumors, underactive thyroid (hypothyroidism), and vitamin B12 deficiency. Use of certain drugs or prescription medicines. What increases the risk? The following factors may make you more likely to develop this condition: Being older than 65 years. Being female. Low education level. Diabetes, high blood pressure, high cholesterol, and other conditions that increase the risk for blood vessel diseases. Untreated or undertreated sleep apnea. Having a certain type of gene that can be passed from parent to child (inherited). Chronic health problems such as heart disease, lung disease, liver disease, kidney disease, or depression. What are the signs or symptoms? Symptoms of this condition include: Difficulty remembering. You may: Forget names, phone numbers, or details of  recent events. Forget social events and appointments. Repeatedly forget where you put your car keys or other items. Difficulty thinking and solving problems. You may have trouble with complex tasks, such as: Paying bills. Driving in unfamiliar places. Difficulty communicating. You may have trouble: Finding the right word or naming an object. Forming a sentence that makes sense, or understanding what you read or hear. Changes in your behavior or personality. When this happens, you may: Lose interest in the things that you used to enjoy. Withdraw from social situations. Get angry more easily than usual. Act before thinking. How is this diagnosed? This condition is diagnosed based on: Your symptoms. Your health care provider may ask you and the people you spend time with, such as family and friends, about your symptoms. Evaluation of mental functions (neuropsychological testing). Your health care provider may refer you to a neurologist or mental health specialist to evaluate your mental functions in detail. To identify the cause of your condition, your health care provider may: Get a detailed medical history. Ask about use of alcohol, drugs, and prescription medicines. Do a physical exam. Order blood tests and brain imaging exams. How is this treated? Mild neurocognitive disorder that is caused by medicine use, drug use, infection, or another medical condition may improve when the cause is treated, or when medicines or drugs are stopped. If this disorder has another cause, it generally does not improve and may get worse. In these cases, the goal of treatment is to help you manage the loss of mental function. Treatments in these cases include: Medicine. Medicine mainly helps memory and behavior symptoms. Talk therapy. Talk therapy provides education, emotional support, memory aids, and other ways of making up for problems with mental function. Lifestyle changes, including: Getting regular  exercise. Eating a healthy diet   that includes omega-3 fatty acids. Challenging your thinking and memory skills. Having more social interaction. Follow these instructions at home: Eating and drinking  Drink enough fluid to keep your urine pale yellow. Eat a healthy diet that includes omega-3 fatty acids. These can be found in: Fish. Nuts. Leafy vegetables. Vegetable oils. If you drink alcohol: Limit how much you use to: 0-1 drink a day for women. 0-2 drinks a day for men. Be aware of how much alcohol is in your drink. In the [place] one drink equals one 12 oz bottle of beer (355 mL), one 5 oz glass of wine (148 mL), or one 1 oz glass of hard liquor (44 mL). Lifestyle  Get regular exercise as told by your health care provider. Do not use any products that contain nicotine or tobacco, such as cigarettes, e-cigarettes, and chewing tobacco. If you need help quitting, ask your health care provider. Practice ways to manage stress. If you need help managing stress, ask your health care provider. Continue to have social interaction. Keep your mind active with stimulating activities you enjoy, such as reading or playing games. Make sure to get quality sleep. Follow these tips: Avoid napping during the day. Keep your sleeping area dark and cool. Avoid exercising during the few hours before you go to bed. Avoid caffeine products in the evening. General instructions Take over-the-counter and prescription medicines only as told by your health care provider. Your health care provider may recommend that you avoid taking medicines that can affect thinking, such as pain medicines or sleep medicines. Work with your health care provider to find out what you need help with and what your safety needs are. Keep all follow-up visits. This is important. Where to find more information [employer] on Aging: http://kim-miller.com/ Contact a health care provider if: You have any new symptoms. Get help right  away if: You develop new confusion or your confusion gets worse. You act in ways that place you or your family in danger. Summary Mild neurocognitive disorder is a disorder in which memory does not work as well as it should. Mild neurocognitive disorder can have many causes. It may be the first stage of dementia. To manage your condition, get regular exercise, keep your mind active, get quality sleep, and eat a healthy diet. This information is not intended to replace advice given to you by your health care provider. Make sure you discuss any questions you have with your health care provider. Document Revised: 03/02/2020 Document Reviewed: 03/02/2020 Elsevier Patient Education  [place].

## 2021-07-16 ENCOUNTER — Telehealth: Payer: Self-pay | Admitting: *Deleted

## 2021-07-16 DIAGNOSIS — R6889 Other general symptoms and signs: Secondary | ICD-10-CM | POA: Insufficient documentation

## 2021-07-16 DIAGNOSIS — Z9181 History of falling: Secondary | ICD-10-CM | POA: Insufficient documentation

## 2021-07-16 NOTE — Assessment & Plan Note (Signed)
Established problem Forward center of gravity due to forward flexion of trunk at waist.  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  Donna Herrera is strongly against physical therapy for her posture and strength.

## 2021-07-16 NOTE — Assessment & Plan Note (Signed)
Patient requires assistance from another to get into or rise from a chair. She relates the impairment to the problems in her hips and back pain/stiffness.  Again, she is not interested in physical therapy.  She is one of the few persons who may benefit from a motorized lift chair.  Medicare would likely cover most of the cost.

## 2021-07-16 NOTE — Progress Notes (Signed)
Provider:  Lissa Morales, MD Location:      Place of Service:     PCP: [name], DO Patient Care Team: [name], DO as PCP - General (Family Medicine) Fay Records, MD as PCP - Cardiology (Cardiology) Lazaro Arms, RN as [facility]  Extended Emergency Contact Information Primary Emergency Contact: Adolm Joseph [place] of [place] Phone: [phone] Relation: Daughter Secondary Emergency Contact: Fredric Mare [place] of [place] Mobile Phone: [phone] Relation: Granddaughter  Code Status: DNR Goals of Care: Advanced Directive information Advanced Directives 07/15/2021  Does Patient Have a Medical Advance Directive? No  Type of Advance Directive -  Copy of [place] in Chart? -  Would patient like information on creating a medical advance directive? No - Patient declined     [facility]:   Patient is accompanied by  Dola Argyle Primary caregiver:  daughter [name]) (ESRD-HD) and SIL, Jaquelyn Bitter (S/P Stroke & MI) Patient's Currently living arrangement:  daughter (Ivy)and SIL, Jaquelyn Bitter Patient information was obtained from   patient, relative(s), and past medical records. History/Exam limitations:  none Primary Care Provider:  Sonia Side, DO Referring provider:  C. Jeani Hawking, MD Reason for referral:  Concern about Memory and Patient's confusion about her medication regiment   HPI by problems:  Chief Complaint  Patient presents with   Memory Loss    Confusion over medications     Cognitive impairment concern  Are there problems with thinking?  Cognition domains: Memory difficulties, Language/Speech difficulties, and Left empry pan on stove  When were the changes first noticed?  ~6 months  Did this change occur abruptly or gradually?  gradual  How have the changes progressed since then?  staying constant  Has there been any tremors or abnormal  movements?  no  Have they had in hallucinations or delusions:  no  Have they appeared more anxious or sad lately?  no  Do they still have interests or activities they enjor doing?  yes, gardening  How has their appetite been lately?  show no change  How has their sleep been lately?  Well   Compared to 5 to 10 years ago, how is the patient at:  Problems with Judgment, e.g., problem making decisions, bad financial decisions, problems with thinking?  no  Less interested in hobbies or previously enjoyed activities?  yes, gardening  Problem remembering things about family and friends e.g. names,  occupations, birthdays, addresses?  no  Problem remembering conversations or news events a few days later?  no  Problem remembering what day and month it is? no  Problem with losing things?  no  Problem learning to use a new gadget or machine around the house, e.g., cell phones, computer, microwave, remote control?  no  Problem with handling money for shopping?  no  Problem handling financial matters, e.g. their pension, checking, credit cards, dealing with the bank?  no  Problem with getting lost in familiar places?  no   Geriatric Depression Scale:  8 / 15   Outpatient Encounter Medications as of 07/15/2021  Medication Sig   acetaminophen (TYLENOL) 500 MG tablet Take 1,000 mg by mouth daily as needed for mild pain or moderate pain.   allopurinol (ZYLOPRIM) 100 MG tablet TAKE 1 TABLET BY MOUTH EVERY DAY   aspirin 81 MG tablet Take 1 tablet (81 mg total) by mouth daily.   atorvastatin (LIPITOR) 40 MG tablet Take 1 tablet (40 mg total)  by mouth daily.   baclofen (LIORESAL) 10 MG tablet Take 1 tablet (10 mg total) by mouth 3 (three) times daily. Will refill for 2 month supply, please schedule appointment with PCP for follow-up for future refills.   calcium carbonate (TUMS - DOSED IN MG ELEMENTAL CALCIUM) 500 MG chewable tablet Chew 1 tablet by mouth daily as needed for  indigestion or heartburn.   carvedilol (COREG) 3.125 MG tablet Take 1 tablet (3.125 mg total) by mouth 2 (two) times daily.   cetirizine (ZYRTEC) 10 MG tablet Take 10 mg by mouth daily as needed for allergies.   chlorhexidine gluconate, MEDLINE KIT, (PERIDEX) 0.12 % solution Use as directed 15 mLs in the mouth or throat 2 (two) times daily.   colchicine 0.6 MG tablet Take 1 tablet (0.6 mg total) by mouth daily.   diclofenac Sodium (VOLTAREN) 1 % GEL Apply 4 g topically 4 (four) times daily.   famotidine (PEPCID) 20 MG tablet Take 20 mg by mouth daily as needed for heartburn or indigestion.   fluticasone (FLONASE) 50 MCG/ACT nasal spray Place 2 sprays into both nostrils daily as needed for allergies or rhinitis.   furosemide (LASIX) 20 MG tablet TAKE 20 MG TABLET BY MOUTH ON Monday, WED, FRI   gabapentin (NEURONTIN) 100 MG capsule TAKE 2 CAPSULES BY MOUTH AT BEDTIME.   hydrALAZINE (APRESOLINE) 50 MG tablet Take 1 tablet (50 mg total) by mouth 3 (three) times daily.   HYDROcodone-acetaminophen (NORCO/VICODIN) 5-325 MG tablet Take 1 tablet by mouth every 6 (six) hours as needed for moderate pain.   Multiple Vitamin (MULTIVITAMIN WITH MINERALS) TABS tablet Take 1 tablet by mouth daily.   Potassium Chloride ER 20 MEQ TBCR TAKE 1 TABLET BY MOUTH AS NEEDED (WHEN TAKING LASIX).   spironolactone (ALDACTONE) 25 MG tablet Take 0.5 tablets (12.5 mg total) by mouth daily.   triamcinolone ointment (KENALOG) 0.5 % Apply 1 application topically 2 (two) times daily.   albuterol (PROVENTIL HFA;VENTOLIN HFA) 108 (90 Base) MCG/ACT inhaler Inhale 1-2 puffs into the lungs every 6 (six) hours as needed for wheezing. (Patient not taking: Reported on 07/15/2021)   Blood Glucose Monitoring Suppl (ONETOUCH VERIO) w/Device KIT Please use to check blood sugar once daily. E11.9 (Patient not taking: No sig reported)   Lancet Devices (ONE TOUCH DELICA LANCING DEV) MISC Please use to check blood sugar once daily. E11.9 (Patient not  taking: Reported on 1/61/0960)   OneTouch Delica Lancets 45W MISC Please use to check blood sugar once daily. E11.9 (Patient not taking: Reported on 07/15/2021)   ONETOUCH VERIO test strip PLEASE USE TO CHECK BLOOD SUGAR ONCE DAILY. E11.9 (Patient not taking: Reported on 07/15/2021)   No facility-administered encounter medications on file as of 07/15/2021.    History Patient Active Problem List   Diagnosis Date Noted   Atherosclerosis of aorta ([place]) 07/15/2021    Priority: High   Emphysema lung ([place]) 03/02/2021    Priority: High   Pulmonary fibrosis ([place]) 03/02/2021    Priority: High   Pulmonary nodule 1 cm or greater in diameter 03/02/2021    Priority: High   NICM (nonischemic cardiomyopathy) ([place]) 06/09/2016    Priority: High   Heart failure, left, with LVEF 41-49% ([place]) 10/21/2015    Priority: High   Essential hypertension 07/20/2017    Priority: Medium   Hallux valgus (acquired), left foot 06/08/2021    Priority: Low   Numbness and tingling of both feet 06/08/2021    Priority: Low   Rotator cuff arthropathy  of both shoulders 11/26/2020    Priority: Low   Prediabetes 03/26/2020    Priority: Low   At high risk for falls 07/16/2021   Difficulty rising from chair 07/16/2021   Dizziness 06/08/2021   Confusion 06/08/2021   Sore in mouth 04/10/2021   Inclusion cyst 03/18/2021   Open comedone 02/11/2021   Degenerative disc disease, cervical 11/26/2020   Rib pain on left side 09/17/2020   History of tobacco abuse 03/26/2020   Adenomatous polyp 03/23/2020   Pain in both feet 10/28/2019   Vitamin D deficiency 12/31/2014   Hyperlipidemia 09/16/2013   Hip pain, right 11/02/2012   DJD (degenerative joint disease) 10/12/2012   Encounter for chronic pain management 10/12/2012   Gout 10/12/2012   GERD (gastroesophageal reflux disease) 10/12/2012   Past Medical History:  Diagnosis Date   Allergy    Arthritis    Atherosclerosis of aorta ([place]) 07/15/2021   Candidiasis, mouth  10/19/2017   Chronic combined systolic and diastolic CHF (congestive heart failure) ([place]) 10/21/2015   Echo 3/16: EF 40%, diffuse HK, mild MR, moderate LAE, mild RAE, PASP 42 mmHg  //  Echo 12/16: Mild LVH, EF 35-40%, diffuse HK, Gr 2 DD, MAC, mild MR, mod LAE, normal RVSF, PASP 49 mmHg // Echo 5/19: EF 15-20, diff HK, Gr 2 DD, trivial AI, MAC, trivial MR, mod LAE, normal RVSF, mild TR, PASP 62 // Limited Echo 8/19: mod LVH, EF 40-45, diff HK, Gr 1 DD, MAC, mild LAE    Chronic left shoulder pain 04/10/2013   Chronic lung disease    Chronic pain    Chronic pain syndrome 07/20/2017   Depression 08/25/2016   DJD (degenerative joint disease) 10/12/2012   Multiple joint replacements by Dr. Collier Salina.    Dyshidrotic eczema 12/31/2014   Emphysema lung ([place]) 03/02/2021   Long smoking history.  Noted on CT Low Dose Chest: 01/2021: IMPRESSION: Chronic interstitial changes raise the question of pulmonary fibrosis. High-resolution chest CT may prove helpful to further evaluate as clinically warranted.Emphysema (ICD10-J43.9) and Aortic Atherosclerosis (ICD10-170.0)  Recommended pulmonology referral but patient declined. Recommend continued discussion and symptom monitor   Essential hypertension 07/20/2017   GERD (gastroesophageal reflux disease)    Gout    H/O slipped capital femoral epiphysis (SCFE)    Heart failure, left, with LVEF 41-49% ([place]) 10/21/2015   transthoracic echocardiogram 06/06/18: EF appears improved to 40-45% with diffuse hypokinesis  A. Echo 3/16: EF 40%, diffuse HK, mild MR, moderate LAE, mild RAE, PASP 42 mmHg  //  B. Echo 12/16: Mild LVH, EF 35-40%, diffuse HK, grade 2 diastolic dysfunction, MAC, mild MR, moderate LAE, normal RVSF, PASP 49 mmHg   History of total right hip replacement 04/02/2019   Followed closely with orthopedist Dr. Alvan Dame. Patient is s/p total right hip replacement in 1980's with evidence of bone loss and osteopenia making reconstruction unlikely. Plan for conservative treatment  at this time with crutches for ambulation and pain control.   Hyperlipidemia    Hypertension    Hypertensive heart disease with CHF (congestive heart failure) ([place]) 10/12/2012   Loss of taste 02/06/2018   Mass of breast, left 07/21/2014   NICM (nonischemic cardiomyopathy) ([place]) 06/09/2016   A. LHC 7/17: Normal coronary arteries, EF 30-35%, LVEDP 14 mmHg   Pain in gums 04/18/2018   Pre-diabetes    Pulmonary fibrosis ([place]) 03/02/2021   CT Low Dose Chest: 01/2021: IMPRESSION: Chronic interstitial changes raise the question of pulmonary fibrosis. High-resolution chest CT may prove helpful to further  evaluate as clinically warranted.Emphysema (ICD10-J43.9) and Aortic Atherosclerosis (ICD10-170.0)  Recommended pulmonology referral but patient declined. Recommend continued discussion and symptom monitoring.   Pulmonary nodule 1 cm or greater in diameter 03/02/2021   CT low dose Chest: 01/2021: 1. Lung-RADS 4A, suspicious. 10 mm posterior left lower lobe pulmonary nodule, possibly scarring. Follow up low-dose chest CT without contrast in 3 months (please use the following order, "CT CHEST LCS NODULE FOLLOW-UP W/O CM") is recommended. Alternatively, PET may be considered when there is a solid component 83m or larger.  Plan: Follow up low dose CT ordered in 3 mon   Right hip pain 11/02/2012   Tendonitis, Achilles, left 05/24/2017   Tobacco abuse 09/29/2014   Vitamin D deficiency 12/31/2014   Past Surgical History:  Procedure Laterality Date   CARDIAC CATHETERIZATION  2005   normal-[place]   CARDIAC CATHETERIZATION N/A 05/11/2016   Procedure: Right/Left Heart Cath and Coronary Angiography;  Surgeon: Peter M JMartinique MD;  [facility];  Service: Cardiovascular;  Laterality: N/A;   CESAREAN SECTION     COLONOSCOPY  1990's ??   HIP FUSION Left    left    JOINT REPLACEMENT Bilateral 1991, 1997   [place]   ROTATOR CUFF REPAIR     bilateral   TOOTH EXTRACTION N/A 04/03/2020   Procedure: DENTAL  RESTORATION/EXTRACTIONS;  Surgeon: JDiona Browner DDS;  [facility]  Service: Oral Surgery;  Laterality: N/A;   TOTAL HIP ARTHROPLASTY  1987   right. Dr. ACollier Salina  TUBAL LIGATION     Family History  Problem Relation Age of Onset   Depression Mother    Diabetes Mother    Hypertension Mother    Stroke Mother    Heart disease Mother    Cancer Father        unknown primary   Diabetes Sister    Stomach cancer Brother    Throat cancer Son    Breast cancer Neg Hx    Esophageal cancer Neg Hx    Colon cancer Neg Hx    Colon polyps Neg Hx    Social History   Socioeconomic History   Marital status: Married    Spouse name: Not on file   Number of children: Not on file   Years of education: Not on file   Highest education level: Not on file  Occupational History   Not on file  Tobacco Use   Smoking status: Former    Packs/day: 0.50    Years: 55.00    Pack years: 27.50    Types: Cigarettes    Quit date: 2017    Years since quitting: 5.7   Smokeless tobacco: Never  Vaping Use   Vaping Use: Never used  Substance and Sexual Activity   Alcohol use: Not Currently    Comment: on holidays   Drug use: No   Sexual activity: Not on file  Other Topics Concern   Not on file  Social History Narrative   Formerly daycare provider. Lives with husband, daughter IKarlene Einsteinand grandkids.   Cardiovascular Risk Factors: Hypertension and Lipids and History of smoking tobacco  Educational History: 14 years formal education Personal History of Seizures: No -  Personal History of Stroke: No -  Personal History of Head Trauma: No -  Personal History of Psychiatric Disorders: No -   Basic Activities of Daily Living  Dressing: Self-care Eating: Self-care Ambulation: Self-care Toileting: Partial assistance do to pain in hips and back Bathing: Self-care Transfers: Needs assistance  because of back and hip pain/stiffness  Instrumental Activities of Daily Living Shopping:  Self-care House/Yard Work: Self-care Administration of medications: Self-care Finances: Self-care Telephone: Self-care Transportation: Self-care  Mobility Assist Devices:  crutches and walker  Caregivers in home: daughter and SIL  Formal Home Health Assistance  Physical Therapy: no  Occupational Therapy: no             Home Aid / Personal Care Service: no             Homemaker services: no  FALLS in last five office visits:  Fall Risk  07/15/2021 06/18/2021 04/08/2021 03/11/2021 02/09/2021  Falls in the past year? 0 0 0 0 0  Number falls in past yr: 0 0 0 0 0  Injury with Fall? 0 - 0 0 0  Risk Factor Category  - - - - -  Risk for fall due to : - Impaired mobility;Impaired balance/gait - - -  Follow up - - - - -    Health Maintenance reviewed: Immunization History  Administered Date(s) Administered   Fluad Quad(high Dose 65+) 09/16/2020, 07/15/2021   Influenza,inj,Quad PF,6+ Mos 07/31/2013, 09/24/2014, 09/15/2015, 09/28/2016, 09/11/2017, 08/14/2018, 08/07/2019   PFIZER Comirnaty(Gray Top)Covid-19 Tri-Sucrose Vaccine 03/18/2021   PFIZER(Purple Top)SARS-COV-2 Vaccination 12/12/2019, 01/06/2020, 09/16/2020   Pneumococcal Conjugate-13 09/24/2014   Pneumococcal Polysaccharide-23 12/14/2016   Tdap 12/14/2016   Health Maintenance Topics with due status: Overdue     Topic Date Due   Zoster Vaccines- Shingrix Never done   COVID-19 Vaccine 07/19/2021    Diet:  edentulous, regular  Geriatric Syndromes: Constipation yes  Incontinence yes urge Dizziness yes  - held spironolactone Syncope no  Visual Impairment no   Hearing impairment no - though failed screening hearing test Dentures problems: While edentulous, patient does not have dentures Eating impairment denies difficulty eating despite absence of teeth and dentures  Impaired Memory or Cognition yes   Sleep problems no   Weight loss no Drug Misadventure: no   Joint pain: yes Joint stiffness: yes Immobility: no  Vital  Signs Weight: 182 lb 2 oz (82.6 kg) Body mass index is 28.52 kg/m. CrCl cannot be calculated (Patient's most recent lab result is older than the maximum 21 days allowed.). Body surface area is 1.98 meters squared. Vitals:   07/15/21 1451  BP: (!) 168/62  Pulse: 63  SpO2: 100%  Weight: 182 lb 2 oz (82.6 kg)  Height: _0  (1.702 m)   Wt Readings from Last 3 Encounters:  07/15/21 182 lb 2 oz (82.6 kg)  06/18/21 181 lb (82.1 kg)  06/08/21 180 lb 6.4 oz (81.8 kg)   Vision Screening   Right eye Left eye Both eyes  Without correction _1  With correction       Physical Examination:  VS reviewed Physical Exam  Vitals:   07/15/21 1451  BP: (!) 168/62  Pulse: 63  SpO2: 100%    HEENT: Bilateral EAC adequately patent, I.e., able to see TMs General physical exam: well-dressed and groomed. Pleasant and cooperative. Cardiac: Regular rate and rhythm without murmur. No carotid bruits Lungs: Clear to auscultation.   No other insights were derived from the general Exam   Parkinsonism Yes _2  No _3   Rigidity Ab nl ? Yes _4  No _5 x  Gait Abnl ? Unable to sit in 90 degree hip flexion position.  Requires use of arms to push up from lean position against geri exam table at lowest position Posture: forward flexion at hips and neck, kyphotic thoracic  trunk, no lumbar sway Tremor ? Yes _0  No _1     Mini-Mental State Examination or Montreal Cognitive Assessment:  Patient did not require additional cues or prompts to complete tasks. Patient was cooperative and attentive to testing tasks Patient did  appear motivated to perform well      Montreal Cognitive Assessment  07/15/2021  Visuospatial/ Executive (0/5) 3  Naming (0/3) 3  Attention: Read list of digits (0/2) 1  Attention: Read list of letters (0/1) 1  Attention: Serial 7 subtraction starting at 100 (0/3) 1  Language: Repeat phrase (0/2) 1  Language : Fluency (0/1) 0  Abstraction (0/2) 1  Delayed Recall (0/5) 0   Orientation (0/6) 5  Total 16   PHQ9 SCORE ONLY 07/15/2021 04/08/2021 03/11/2021  PHQ-9 Total Score 4 9 0    Labs No components found for: [facility]  Lab Results  Component Value Date   VITAMINB12 461 06/08/2021    Lab Results  Component Value Date   FOLATE 19.8 06/08/2021    Lab Results  Component Value Date   TSH 0.582 10/04/2015    No results found for: RPR  Lab Results  Component Value Date   HIV Non Reactive 10/18/2017      Chemistry      Component Value Date/Time   NA 141 06/08/2021 1215   K 4.7 06/08/2021 1215   CL 100 06/08/2021 1215   CO2 22 06/08/2021 1215   BUN 15 06/08/2021 1215   CREATININE 0.89 06/08/2021 1215   CREATININE 0.69 07/21/2016 1355      Component Value Date/Time   CALCIUM 7.2 (L) 06/08/2021 1215   ALKPHOS 124 (H) 02/09/2021 1147   AST 19 02/09/2021 1147   ALT 14 02/09/2021 1147   BILITOT 0.3 02/09/2021 1147       CrCl cannot be calculated (Patient's most recent lab result is older than the maximum 21 days allowed.).   Lab Results  Component Value Date   HGBA1C 6.0 (H) 06/08/2021     _2 @ Vision Screening   Right eye Left eye Both eyes  Without correction _3  With correction      Lab Results  Component Value Date   WBC 5.7 06/08/2021   HGB 13.3 06/08/2021   HCT 42.1 06/08/2021   MCV 89 06/08/2021   PLT 209 06/08/2021    Imaging Head CT: none  Brain MRI: none  Advanced Directives Code Status: unaddressed Advance Directives: none -------------------------------------------------------------------------------------------------- Assessment and Plan: Please see individual consultation notes from pharmacy.  Problem List Items Addressed This Visit     At high risk for falls    Established problem Forward center of gravity due to forward flexion of trunk at waist.  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  Ms  Cartelli is strongly against physical therapy for her posture and strength.       Difficulty rising from chair    Patient requires assistance from another to get into or rise from a chair. She relates the impairment to the problems in her hips and back pain/stiffness.  Again, she is not interested in physical therapy.  She is one of the few persons who may benefit from a motorized lift chair.  Medicare would likely cover most of the cost.        Other Visit Diagnoses     mild cognitive impairment    -  Primary   High risk medication use  Hypocalcemia       Need for immunization against influenza       Relevant Orders   Flu Vaccine QUAD High Dose(Fluad) (Completed)      At high risk for falls Established problem Forward center of gravity due to forward flexion of trunk at waist.  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  Ms Hesch is strongly against physical therapy for her posture and strength.   Difficulty rising from chair Patient requires assistance from another to get into or rise from a chair. She relates the impairment to the problems in her hips and back pain/stiffness.  Again, she is not interested in physical therapy.  She is one of the few persons who may benefit from a motorized lift chair.  Medicare would likely cover most of the cost.     Primary Contact: Extended Emergency Contact Information Primary Emergency Contact: Adolm Joseph States of [place] Mobile Phone: [phone] Relation: Daughter Secondary Emergency Contact: Fredric Mare [place] Mobile Phone: [phone] Relation: Granddaughter    > 30mnutes face to face were spent in total with chart review, interview and examination, coordinating interdisciplinary discussion, patient and family counseling. The Geriatric interdisciplinary team meet to discuss the patient's assessment, problem list,  and recommendations.  The interdisciplinary team consisted of representatives from medicine, pharmacy.. The interdisciplinary team meet individually with the patient and family to review the team's findings, assessments, and recommendations.

## 2021-07-16 NOTE — Chronic Care Management (AMB) (Signed)
  Care Management   Note  07/16/2021 Name: Donna Herrera MRN: [id number] DOB: [birth year]  [name] is a 75 y.o. year old female who is a primary care patient of [name], DO. I reached out to [employer] by phone today in response to a referral sent by Ms. Kari Baars PCP, [name], DO.    [name] was given information about care management services today including:  Care management services include personalized support from designated clinical staff supervised by her physician, including individualized plan of care and coordination with other care providers 24/7 contact phone numbers for assistance for urgent and routine care needs. The patient may stop care management services at any time by phone call to the office staff.  Patient agreed to services and verbal consent obtained.   Follow up plan: Telephone appointment with care management team member scheduled for:07/20/21  [facility]  Direct Dial: [phone]

## 2021-07-19 ENCOUNTER — Encounter: Payer: Self-pay | Admitting: Family Medicine

## 2021-07-19 DIAGNOSIS — R413 Other amnesia: Secondary | ICD-10-CM | POA: Insufficient documentation

## 2021-07-19 DIAGNOSIS — K08109 Complete loss of teeth, unspecified cause, unspecified class: Secondary | ICD-10-CM | POA: Insufficient documentation

## 2021-07-19 DIAGNOSIS — Z1331 Encounter for screening for depression: Secondary | ICD-10-CM | POA: Insufficient documentation

## 2021-07-19 DIAGNOSIS — M19012 Primary osteoarthritis, left shoulder: Secondary | ICD-10-CM | POA: Insufficient documentation

## 2021-07-19 DIAGNOSIS — N3941 Urge incontinence: Secondary | ICD-10-CM

## 2021-07-19 DIAGNOSIS — M19011 Primary osteoarthritis, right shoulder: Secondary | ICD-10-CM | POA: Insufficient documentation

## 2021-07-19 HISTORY — DX: Complete loss of teeth, unspecified cause, unspecified class: K08.109

## 2021-07-19 HISTORY — DX: Urge incontinence: N39.41

## 2021-07-19 NOTE — Assessment & Plan Note (Signed)
Patient does not seem interested in obtaining/using dentures. Should the patient develop unintentional weight loss, this may be a topic to address.

## 2021-07-19 NOTE — Assessment & Plan Note (Addendum)
New issue Donna Herrera had a cognitive test score MoCA 16 out of 30 which is below the cutoff of 24  She remains able to perform her iADLs.  Her ADL dependences do not appear to originate from limitations in cognition, rather from her musculoskeletal conditions.   This combination of lower cognitive test score with preserved iADL is c/w DSM-V definition of mild neurocognitive disorder, AKA, Mild Cognitive Disorder.    It was explained to the patient that regular exercise, healthy diet (particularly the mediterranean diet), and social activity (especially group learning social activities) help slow the decline in cognition abilities.   The risk of progression to dementia was explained.  The importance of developing a plan for her future healthcare and estate management was discussed.    She will not require further cognitive testing to monitor for development of further cognitive decline, rather asking about her iADL and ADL abilities periodically is adequate.

## 2021-07-19 NOTE — Assessment & Plan Note (Signed)
8/15 Geriatric Depression Scale but 4 on PHQ9.  GDS is geared more towards the feelings of fatigue, boredom and emptiness. These may best be addressed with helping patient engage with community and family.   We will ask [facility] to reach out to patient with information about community activity resources for seniors.

## 2021-07-19 NOTE — Assessment & Plan Note (Signed)
10 mm left solitary pulmonary nodule on screening low-dose Chest CT on 02/25/21. Possibly scarring. Radiology recommended follow up low-dose chest CT without contrast in 3 months (August 2022) using the following order, "CT CHEST LCS NODULE FOLLOW-UP W/O CM") is recommended.   Consider addressing this with Ms Stimmel at next office visit.

## 2021-07-20 ENCOUNTER — Ambulatory Visit: Payer: Medicare Other

## 2021-07-21 ENCOUNTER — Other Ambulatory Visit: Payer: Self-pay

## 2021-07-21 ENCOUNTER — Ambulatory Visit: Payer: Medicare Other | Admitting: Internal Medicine

## 2021-07-21 ENCOUNTER — Encounter: Payer: Self-pay | Admitting: Internal Medicine

## 2021-07-21 VITALS — BP 120/90 | HR 55 | Ht 64.0 in | Wt 183.2 lb

## 2021-07-21 DIAGNOSIS — I700 Atherosclerosis of aorta: Secondary | ICD-10-CM | POA: Diagnosis not present

## 2021-07-21 DIAGNOSIS — I10 Essential (primary) hypertension: Secondary | ICD-10-CM | POA: Diagnosis not present

## 2021-07-21 DIAGNOSIS — I5042 Chronic combined systolic (congestive) and diastolic (congestive) heart failure: Secondary | ICD-10-CM | POA: Diagnosis not present

## 2021-07-21 MED ORDER — ROSUVASTATIN CALCIUM 20 MG PO TABS: 20.0000 mg | ORAL_TABLET | Freq: Every day | ORAL | 3 refills | Status: DC

## 2021-07-21 NOTE — Patient Instructions (Addendum)
Medication Instructions:  Your physician has recommended you make the following change in your medication:  1.) stop atorvastatin (Lipitor) 2.) start rosuvastatin (Crestor) 20 mg - one tablet daily  *If you need a refill on your cardiac medications before your next appointment, please call your pharmacy*   Lab Work: Please return in about 8 weeks to check cholesterol (Lipids)   Testing/Procedures: none   Follow-Up: At [facility], you and your health needs are our priority.  As part of our continuing mission to provide you with exceptional heart care, we have created designated Provider Care Teams.  These Care Teams include your primary Cardiologist (physician) and Advanced Practice Providers (APPs -  Physician Assistants and Nurse Practitioners) who all work together to provide you with the care you need, when you need it.  We recommend signing up for the patient portal called "MyChart".  Sign up information is provided on this After Visit Summary.  MyChart is used to connect with patients for Virtual Visits (Telemedicine).  Patients are able to view lab/test results, encounter notes, upcoming appointments, etc.  Non-urgent messages can be sent to your provider as well.   To learn more about what you can do with MyChart, go to [email or web address].    Your next appointment:   6 month(s)  The format for your next appointment:   In Person  Provider:   You may see Dorris Carnes, MD or one of the following Advanced Practice Providers on your designated Care Team:   [name], PA-C Robbie Lis, [place]   Other Instructions

## 2021-07-21 NOTE — Patient Instructions (Signed)
Ms. Gadbois  it was nice speaking with you. Please call me directly [phone] if you have questions about the goals we discussed.   Goals Addressed               This Visit's Progress     Manage Chronic Pain (pt-stated)        Timeframe:  Long-Range Goal Priority:  High Start Date:         07/20/21                    Expected End Date:    08/30/21                   Follow Up Date 08/18/21    - develop a personal pain management plan - track times pain is worst and when it is best - use ice or heat for pain relief - work slower and less intense when having pain    Why is this important?   Day-to-day life can be hard when you have chronic pain.  Pain medicine is just one piece of the treatment puzzle.  You can try these action steps to help you manage your pain.    Notes:       Track and Manage My Symptoms-COPD        Timeframe:  Long-Range Goal Priority:  High Start Date:       07/20/21                      Expected End Date:  08/30/21                     Follow Up Date 08/18/21   - develop a rescue plan - follow rescue plan if symptoms flare-up - keep follow-up appointments    Why is this important?   Tracking your symptoms and other information about your health helps your doctor plan your care.  Write down the symptoms, the time of day, what you were doing and what medicine you are taking.  You will soon learn how to manage your symptoms.     Notes:         Patient Care Plan: RN Case Manager     Problem Identified: COPD and Chronic Pain      Long-Range Goal: The patient will monitor and manage her COPD and Chronic pain   Start Date: 07/20/2021  Expected End Date: 09/29/2021  Priority: High  Note:   Current Barriers:  Knowledge Deficits related to plan of care for management of COPD and Chronic Pain   RNCM Clinical Goal(s):  Patient will verbalize understanding of plan for management of COPD and Chronic Pain and work to manage and prevent  exacerbation of symptoms  through collaboration with Consulting civil engineer, provider, and care team.   Interventions: 1:1 collaboration with primary care provider regarding development and update of comprehensive plan of care as evidenced by provider attestation and co-signature Inter-disciplinary care team collaboration (see longitudinal plan of care) Evaluation of current treatment plan related to  self management and patient's adherence to plan as established by provider   COPD: (Status: New goal.) Provided patient with basic written and verbal COPD education on self care/management/and exacerbation prevention; Advised patient to track and manage COPD triggers;  Provided instruction about proper use of medications used for management of COPD including inhalers; Advised patient to self assesses COPD action plan zone and make appointment with provider if in the yellow  zone for 48 hours without improvement; Provided education about and advised patient to utilize infection prevention strategies to reduce risk of respiratory infection; Will send the patient educational material The patient was provided contact information for question or concerns  Pain:  (Status: New goal.) Pain assessment performed Medications reviewed Reviewed provider established plan for pain management; Discussed importance of adherence to all scheduled medical appointments; Counseled on the importance of reporting any/all new or changed pain symptoms or management strategies to pain management provider; Discussed use of relaxation techniques and/or diversional activities to assist with pain reduction (distraction, imagery, relaxation, massage, acupressure, TENS, heat, and cold application; Reviewed with patient prescribed pharmacological and nonpharmacological pain relief strategies; Will send the patient Educational material The patient was provided contact information for question or concerns    Patient Goals/Self-Care  Activities: Patient will self administer medications as prescribed Patient will attend all scheduled provider appointments Patient will call pharmacy for medication refills Patient will call provider office for new concerns or questions        Ms. [facility] received Care Management services today:  Care Management services include personalized support from designated clinical staff supervised by her physician, including individualized plan of care and coordination with other care providers 24/7 contact [phone] for assistance for urgent and routine care needs. Care Management are voluntary services and be declined at any time by calling the office.  The patient verbalized understanding of instructions provided today and declined a print copy of patient instruction materials.    Follow Up Plan: Patient would like continued follow-up.  CCM RNCM will outreach the patient within the next 4 weeks.  Patient will call office if needed prior to next encounter   Lazaro Arms, RN   [phone]

## 2021-07-21 NOTE — Chronic Care Management (AMB) (Signed)
Care Management    RN Visit Note  07/21/2021 Name: Donna Herrera MRN: [id number] DOB: 1946/04/13  Subjective: Donna Herrera is a 75 y.o. year old female who is a primary care patient of [name], DO. The care management team was consulted for assistance with disease management and care coordination needs.    Engaged with patient by telephone for initial visit in response to provider referral for case management and/or care coordination services.   Consent to Services:   Donna Herrera was given information about Care Management services today including:  Care Management services includes personalized support from designated clinical staff supervised by her physician, including individualized plan of care and coordination with other care providers 24/7 contact phone numbers for assistance for urgent and routine care needs. The patient may stop case management services at any time by phone call to the office staff.  Patient agreed to services and consent obtained.    Assessment:  The patient and her daughter Donna Herrera provided all the information during this encounter. The patient experiences some issues with her breathing and pain in her lower back and hips . See Care Plan below for interventions and patient self-care actives. Follow up Plan: Patient would like continued follow-up.  CCM RNCM will outreach the patient within the next 4 weeks.  Patient will call office if needed prior to next encounter t: Review of patient past medical history, allergies, medications, health status, including review of consultants reports, laboratory and other test data, was performed as part of comprehensive evaluation and provision of chronic care management services.   SDOH (Social Determinants of Health) assessments and interventions performed:    Care Plan  Allergies  Allergen Reactions   Crab [Shellfish Allergy]     Itching to lips   Morphine And Related Itching and  Swelling   Atorvastatin Other (See Comments)    achiness    Outpatient Encounter Medications as of 07/20/2021  Medication Sig   acetaminophen (TYLENOL) 500 MG tablet Take 1,000 mg by mouth daily as needed for mild pain or moderate pain.   albuterol (PROVENTIL HFA;VENTOLIN HFA) 108 (90 Base) MCG/ACT inhaler Inhale 1-2 puffs into the lungs every 6 (six) hours as needed for wheezing.   allopurinol (ZYLOPRIM) 100 MG tablet TAKE 1 TABLET BY MOUTH EVERY DAY   aspirin 81 MG tablet Take 1 tablet (81 mg total) by mouth daily.   baclofen (LIORESAL) 10 MG tablet Take 1 tablet (10 mg total) by mouth 3 (three) times daily. Will refill for 2 month supply, please schedule appointment with PCP for follow-up for future refills.   Blood Glucose Monitoring Suppl (ONETOUCH VERIO) w/Device KIT Please use to check blood sugar once daily. E11.9   calcium carbonate (TUMS - DOSED IN MG ELEMENTAL CALCIUM) 500 MG chewable tablet Chew 1 tablet by mouth daily as needed for indigestion or heartburn. (Patient not taking: Reported on 07/21/2021)   carvedilol (COREG) 3.125 MG tablet Take 1 tablet (3.125 mg total) by mouth 2 (two) times daily.   cetirizine (ZYRTEC) 10 MG tablet Take 10 mg by mouth daily as needed for allergies.   chlorhexidine gluconate, MEDLINE KIT, (PERIDEX) 0.12 % solution Use as directed 15 mLs in the mouth or throat 2 (two) times daily.   colchicine 0.6 MG tablet Take 1 tablet (0.6 mg total) by mouth daily.   diclofenac Sodium (VOLTAREN) 1 % GEL Apply 4 g topically 4 (four) times daily.   famotidine (PEPCID) 20 MG tablet Take 20 mg by mouth daily  as needed for heartburn or indigestion. (Patient not taking: Reported on 07/21/2021)   fluticasone (FLONASE) 50 MCG/ACT nasal spray Place 2 sprays into both nostrils daily as needed for allergies or rhinitis.   furosemide (LASIX) 20 MG tablet TAKE 20 MG TABLET BY MOUTH ON Monday, WED, FRI   gabapentin (NEURONTIN) 100 MG capsule TAKE 2 CAPSULES BY MOUTH AT BEDTIME.    hydrALAZINE (APRESOLINE) 50 MG tablet Take 1 tablet (50 mg total) by mouth 3 (three) times daily.   HYDROcodone-acetaminophen (NORCO) 5-325 MG tablet Take 1 tablet by mouth every 6 (six) hours as needed for moderate pain.   Multiple Vitamin (MULTIVITAMIN WITH MINERALS) TABS tablet Take 1 tablet by mouth daily.   ONETOUCH VERIO test strip PLEASE USE TO CHECK BLOOD SUGAR ONCE DAILY. E11.9   Potassium Chloride ER 20 MEQ TBCR TAKE 1 TABLET BY MOUTH AS NEEDED (WHEN TAKING LASIX).   spironolactone (ALDACTONE) 25 MG tablet Take 0.5 tablets (12.5 mg total) by mouth daily.   triamcinolone ointment (KENALOG) 0.5 % Apply 1 application topically 2 (two) times daily.   [DISCONTINUED] atorvastatin (LIPITOR) 40 MG tablet Take 1 tablet (40 mg total) by mouth daily.   HYDROcodone-acetaminophen (NORCO/VICODIN) 5-325 MG tablet Take 1 tablet by mouth every 6 (six) hours as needed for moderate pain.   Lancet Devices (ONE TOUCH DELICA LANCING DEV) MISC Please use to check blood sugar once daily. E15.8   OneTouch Delica Lancets 30N MISC Please use to check blood sugar once daily. E11.9   No facility-administered encounter medications on file as of 07/20/2021.    Patient Active Problem List   Diagnosis Date Noted   Positive depression screening 07/19/2021   Edentulous 07/19/2021   Urge incontinence 07/19/2021   Osteoarthritis of shoulders, bilateral 07/19/2021   mild cognitive impairment 07/19/2021   At high risk for falls 07/16/2021   Difficulty rising from chair 07/16/2021   Atherosclerosis of aorta ([place]) 07/15/2021   Dizziness 06/08/2021   Hallux valgus (acquired), left foot 06/08/2021   Numbness and tingling of both feet 06/08/2021   Emphysema lung ([place]) 03/02/2021   Pulmonary fibrosis ([place]) 03/02/2021   Pulmonary nodule 1 cm or greater in diameter 03/02/2021   Degenerative disc disease, cervical 11/26/2020   Rotator cuff arthropathy of both shoulders 11/26/2020   History of tobacco abuse 03/26/2020    Prediabetes 03/26/2020   Pain in both feet 10/28/2019   Essential hypertension 07/20/2017   NICM (nonischemic cardiomyopathy) ([place]) 06/09/2016   Heart failure, left, with LVEF 41-49% ([place]) 10/21/2015   Vitamin D deficiency 12/31/2014   Hyperlipidemia 09/16/2013   Hip pain, right 11/02/2012   DJD (degenerative joint disease) 10/12/2012   Encounter for chronic pain management 10/12/2012   Gout 10/12/2012   GERD (gastroesophageal reflux disease) 10/12/2012    Conditions to be addressed/monitored: COPD and Chronic Pain  Care Plan : RN Case Manager  Updates made by Lazaro Arms, RN since 07/21/2021 12:00 AM     Problem: COPD and Chronic Pain      Long-Range Goal: The patient will monitor and manage her COPD and Chronic pain   Start Date: 07/20/2021  Expected End Date: 09/29/2021  Priority: High  Note:   Current Barriers:  Knowledge Deficits related to plan of care for management of COPD and Chronic Pain   RNCM Clinical Goal(s):  Patient will verbalize understanding of plan for management of COPD and Chronic Pain and work to manage and prevent exacerbation of symptoms  through collaboration with Consulting civil engineer, provider, and  care team.   Interventions: 1:1 collaboration with primary care provider regarding development and update of comprehensive plan of care as evidenced by provider attestation and co-signature Inter-disciplinary care team collaboration (see longitudinal plan of care) Evaluation of current treatment plan related to  self management and patient's adherence to plan as established by provider   COPD: (Status: New goal.) Provided patient with basic written and verbal COPD education on self care/management/and exacerbation prevention; Advised patient to track and manage COPD triggers;  Provided instruction about proper use of medications used for management of COPD including inhalers; Advised patient to self assesses COPD action plan zone and make appointment with  provider if in the yellow zone for 48 hours without improvement; Provided education about and advised patient to utilize infection prevention strategies to reduce risk of respiratory infection; Will send the patient educational material The patient was provided contact information for question or concerns  Pain:  (Status: New goal.) Pain assessment performed Medications reviewed Reviewed provider established plan for pain management; Discussed importance of adherence to all scheduled medical appointments; Counseled on the importance of reporting any/all new or changed pain symptoms or management strategies to pain management provider; Discussed use of relaxation techniques and/or diversional activities to assist with pain reduction (distraction, imagery, relaxation, massage, acupressure, TENS, heat, and cold application; Reviewed with patient prescribed pharmacological and nonpharmacological pain relief strategies; Will send the patient Educational material The patient was provided contact information for question or concerns    Patient Goals/Self-Care Activities: Patient will self administer medications as prescribed Patient will attend all scheduled provider appointments Patient will call pharmacy for medication refills Patient will call provider office for new concerns or questions       Lazaro Arms RN, BSN, [facility] Care Management Coordinator [place] Phone: [phone] I Fax: [phone]

## 2021-07-21 NOTE — Progress Notes (Signed)
Cardiology Office Note   Date:  07/21/2021   ID:  Donna Herrera, DOB 07-Apr-1946, MRN [id number]  PCP:  [name], DO  Cardiologist:   Dorris Carnes, MD   F/U of CHF     History of Present Illness: Donna Herrera is a 75 y.o. female with a history of systolic CHF   LVEF in 15 to 20% (Cath in 2017 normal)   Repeat in 2019 LVEF 40 to 57  Pt also has a hx of HTN, HL and tob use I last saw the pt in Dec 2021    Since then she was seen by M Supple    Pt says she takes BP at home  Up and down   REcently was 120  A  week go it was high   Has had some dizziness/ weakness over the summer   Denies CP   Breathing is stable    Current Meds  Medication Sig   acetaminophen (TYLENOL) 500 MG tablet Take 1,000 mg by mouth daily as needed for mild pain or moderate pain.   albuterol (PROVENTIL HFA;VENTOLIN HFA) 108 (90 Base) MCG/ACT inhaler Inhale 1-2 puffs into the lungs every 6 (six) hours as needed for wheezing.   allopurinol (ZYLOPRIM) 100 MG tablet TAKE 1 TABLET BY MOUTH EVERY DAY   aspirin 81 MG tablet Take 1 tablet (81 mg total) by mouth daily.   atorvastatin (LIPITOR) 40 MG tablet Take 1 tablet (40 mg total) by mouth daily.   baclofen (LIORESAL) 10 MG tablet Take 1 tablet (10 mg total) by mouth 3 (three) times daily. Will refill for 2 month supply, please schedule appointment with PCP for follow-up for future refills.   Blood Glucose Monitoring Suppl (ONETOUCH VERIO) w/Device KIT Please use to check blood sugar once daily. E11.9   carvedilol (COREG) 3.125 MG tablet Take 1 tablet (3.125 mg total) by mouth 2 (two) times daily.   cetirizine (ZYRTEC) 10 MG tablet Take 10 mg by mouth daily as needed for allergies.   chlorhexidine gluconate, MEDLINE KIT, (PERIDEX) 0.12 % solution Use as directed 15 mLs in the mouth or throat 2 (two) times daily.   colchicine 0.6 MG tablet Take 1 tablet (0.6 mg total) by mouth daily.   diclofenac Sodium (VOLTAREN) 1 % GEL Apply 4 g topically  4 (four) times daily.   fluticasone (FLONASE) 50 MCG/ACT nasal spray Place 2 sprays into both nostrils daily as needed for allergies or rhinitis.   furosemide (LASIX) 20 MG tablet TAKE 20 MG TABLET BY MOUTH ON Monday, WED, FRI   gabapentin (NEURONTIN) 100 MG capsule TAKE 2 CAPSULES BY MOUTH AT BEDTIME.   hydrALAZINE (APRESOLINE) 50 MG tablet Take 1 tablet (50 mg total) by mouth 3 (three) times daily.   HYDROcodone-acetaminophen (NORCO) 5-325 MG tablet Take 1 tablet by mouth every 6 (six) hours as needed for moderate pain.   HYDROcodone-acetaminophen (NORCO/VICODIN) 5-325 MG tablet Take 1 tablet by mouth every 6 (six) hours as needed for moderate pain.   Lancet Devices (ONE TOUCH DELICA LANCING DEV) MISC Please use to check blood sugar once daily. E11.9   Multiple Vitamin (MULTIVITAMIN WITH MINERALS) TABS tablet Take 1 tablet by mouth daily.   OneTouch Delica Lancets 89H MISC Please use to check blood sugar once daily. E11.9   ONETOUCH VERIO test strip PLEASE USE TO CHECK BLOOD SUGAR ONCE DAILY. E11.9   Potassium Chloride ER 20 MEQ TBCR TAKE 1 TABLET BY MOUTH AS NEEDED (WHEN TAKING LASIX).   spironolactone (  ALDACTONE) 25 MG tablet Take 0.5 tablets (12.5 mg total) by mouth daily.   triamcinolone ointment (KENALOG) 0.5 % Apply 1 application topically 2 (two) times daily.     Allergies:   Crab [shellfish allergy] and Morphine and related   Past Medical History:  Diagnosis Date   Adenomatous polyp 03/23/2020   Colonoscopy 2021 notable for adenomatous polyp. Per GI, no further follow up colonoscopies recommended given patient's age.   Allergy    Arthritis    Atherosclerosis of aorta ([place]) 07/15/2021   Candidiasis, mouth 10/19/2017   Chronic combined systolic and diastolic CHF (congestive heart failure) ([place]) 10/21/2015   Echo 3/16: EF 40%, diffuse HK, mild MR, moderate LAE, mild RAE, PASP 42 mmHg  //  Echo 12/16: Mild LVH, EF 35-40%, diffuse HK, Gr 2 DD, MAC, mild MR, mod LAE, normal RVSF, PASP  49 mmHg // Echo 5/19: EF 15-20, diff HK, Gr 2 DD, trivial AI, MAC, trivial MR, mod LAE, normal RVSF, mild TR, PASP 62 // Limited Echo 8/19: mod LVH, EF 40-45, diff HK, Gr 1 DD, MAC, mild LAE    Chronic left shoulder pain 04/10/2013   Chronic lung disease    Chronic pain    Chronic pain syndrome 07/20/2017   Confusion 06/08/2021   Depression 08/25/2016   DJD (degenerative joint disease) 10/12/2012   Multiple joint replacements by Dr. Collier Salina.    Dyshidrotic eczema 12/31/2014   Edentulous 07/19/2021   Emphysema lung ([place]) 03/02/2021   Long smoking history.  Noted on CT Low Dose Chest: 01/2021: IMPRESSION: Chronic interstitial changes raise the question of pulmonary fibrosis. High-resolution chest CT may prove helpful to further evaluate as clinically warranted.Emphysema (ICD10-J43.9) and Aortic Atherosclerosis (ICD10-170.0)  Recommended pulmonology referral but patient declined. Recommend continued discussion and symptom monitor   Essential hypertension 07/20/2017   GERD (gastroesophageal reflux disease)    Gout    H/O slipped capital femoral epiphysis (SCFE)    Heart failure, left, with LVEF 41-49% ([place]) 10/21/2015   transthoracic echocardiogram 06/06/18: EF appears improved to 40-45% with diffuse hypokinesis  A. Echo 3/16: EF 40%, diffuse HK, mild MR, moderate LAE, mild RAE, PASP 42 mmHg  //  B. Echo 12/16: Mild LVH, EF 35-40%, diffuse HK, grade 2 diastolic dysfunction, MAC, mild MR, moderate LAE, normal RVSF, PASP 49 mmHg   History of total right hip replacement 04/02/2019   Followed closely with orthopedist Dr. Alvan Dame. Patient is s/p total right hip replacement in 1980's with evidence of bone loss and osteopenia making reconstruction unlikely. Plan for conservative treatment at this time with crutches for ambulation and pain control.   Hyperlipidemia    Hypertension    Hypertensive heart disease with CHF (congestive heart failure) ([place]) 10/12/2012   Loss of taste 02/06/2018   Mass of breast, left  07/21/2014   NICM (nonischemic cardiomyopathy) ([place]) 06/09/2016   A. LHC 7/17: Normal coronary arteries, EF 30-35%, LVEDP 14 mmHg   Pain in gums 04/18/2018   Pre-diabetes    Pulmonary fibrosis ([place]) 03/02/2021   CT Low Dose Chest: 01/2021: IMPRESSION: Chronic interstitial changes raise the question of pulmonary fibrosis. High-resolution chest CT may prove helpful to further evaluate as clinically warranted.Emphysema (ICD10-J43.9) and Aortic Atherosclerosis (ICD10-170.0)  Recommended pulmonology referral but patient declined. Recommend continued discussion and symptom monitoring.   Pulmonary nodule 1 cm or greater in diameter 03/02/2021   CT low dose Chest: 01/2021: 1. Lung-RADS 4A, suspicious. 10 mm posterior left lower lobe pulmonary nodule, possibly scarring. Follow up low-dose chest CT  without contrast in 3 months (please use the following order, "CT CHEST LCS NODULE FOLLOW-UP W/O CM") is recommended. Alternatively, PET may be considered when there is a solid component 49m or larger.  Plan: Follow up low dose CT ordered in 3 mon   Rib pain on left side 09/17/2020   Right hip pain 11/02/2012   Tendonitis, Achilles, left 05/24/2017   Tobacco abuse 09/29/2014   Urge incontinence 07/19/2021   Vitamin D deficiency 12/31/2014    Past Surgical History:  Procedure Laterality Date   CARDIAC CATHETERIZATION  2005   normal-[place]   CARDIAC CATHETERIZATION N/A 05/11/2016   Procedure: Right/Left Heart Cath and Coronary Angiography;  Surgeon: Peter M JMartinique MD;  [facility];  Service: Cardiovascular;  Laterality: N/A;   CESAREAN SECTION     COLONOSCOPY  1990's ??   HIP FUSION Left    left    JOINT REPLACEMENT Bilateral 1991, 1997   [place]   ROTATOR CUFF REPAIR     bilateral   TOOTH EXTRACTION N/A 04/03/2020   Procedure: DENTAL RESTORATION/EXTRACTIONS;  Surgeon: JDiona Browner DDS;  [facility]  Service: Oral Surgery;  Laterality: N/A;   TOTAL HIP ARTHROPLASTY  1987   right. Dr.  ACollier Salina  TUBAL LIGATION       Social History:  The patient  reports that she quit smoking about 5 years ago. Her smoking use included cigarettes. She has a 27.50 pack-year smoking history. She has never used smokeless tobacco. She reports that she does not currently use alcohol. She reports that she does not use drugs.   Family History:  The patient's family history includes Cancer in her father; Depression in her mother; Diabetes in her mother and sister; Heart disease in her mother; Hypertension in her mother; Stomach cancer in her brother; Stroke in her mother; Throat cancer in her son.    ROS:  Please see the history of present illness. All other systems are reviewed and  Negative to the above problem except as noted.    PHYSICAL EXAM: VS:  BP 120/90   Pulse (!) 55   Ht 5' 4" (1.626 m)   Wt 183 lb 3.2 oz (83.1 kg)   SpO2 97%   BMI 31.45 kg/m   GEN: Obese 75yo in no acute distress  HEENT: normal  Neck: no JVD   Cardiac: RRR; no murmurs   No LE  edema  Respiratory:  clear to auscultation bilaterally GI: soft, nontender, nondistended, + BS  MS: no deformity Moving all extremities   Skin: warm and dry, no rash Neuro: Deferred   EKG:  EKG is ordered today.  Sinus bradycardia   55 bpm   Occasional PVC  Prior CV Studies: Echo 06/06/18 Mod LVH, EF 40-45, diff HK, Gr 1 DD, MAC, trivial MR, mild LAE, trivial TR   Echo 03/06/2018 EF 15-20, diffuse HK, grade 2 diastolic dysfunction, trivial AI, MAC, trivial MR, moderate LAE, normal RVSF, mild TR, PASP 62   LHC 05/11/16 1. Normal coronary anatomy 2. Severe LV dysfunction with EF 30-35%. 3. Normal right heart and LV filling pressures. Lipid Panel    Component Value Date/Time   CHOL 243 (H) 02/09/2021 1147   TRIG 114 02/09/2021 1147   HDL 60 02/09/2021 1147   CHOLHDL 4.1 02/09/2021 1147   CHOLHDL 3.5 10/04/2015 0141   VLDL 16 10/04/2015 0141   LDLCALC 163 (H) 02/09/2021 1147   LDLDIRECT 174 (H) 06/08/2021 1215      Wt  Readings from Last 3 Encounters:  07/21/21 183 lb 3.2 oz (83.1 kg)  07/15/21 182 lb 2 oz (82.6 kg)  06/18/21 181 lb (82.1 kg)      ASSESSMENT AND PLAN:  1 Hx nonischemic cardiomyopathy  Volume status looks good    Her BP has limited titration of meds   Pt felt weak on Entresto    I would continue on current regimen    Keep tabs on BP   If high can take an additional 1/2 of hydralazine    2  HTN   Keep on current meds   Since BP labile use prn hydralzaine bump 3  HL   Did nto tolerate lipitor   Achy   Stop  Given fact she has atherosclerosis should be on statin   Will try Crestor   20 mg   F/U lipids and AST in 8 wks   Plan for f/u in 6 months   Current medicines are reviewed at length with the patient today.  The patient does not have concerns regarding medicines.  Signed, Dorris Carnes, MD  07/21/2021 9:38 AM    [address], [place], [place]  [address] Phone: [phone]; Fax: [phone]

## 2021-07-27 ENCOUNTER — Telehealth: Payer: Self-pay

## 2021-07-27 NOTE — Telephone Encounter (Signed)
   Telephone encounter was:  Successful.  07/27/2021 Name: Donna Herrera MRN: [id number] DOB: 1946-10-11  Donna Herrera is a 75 y.o. year old female who is a primary care patient of [name], DO . The community resource team was consulted for assistance with  Caregiver Support Person.  Care guide performed the following interventions:  Patient is looking for a caregiver support person to come out to her house 3x a week.  Follow Up Plan:  Care guide will follow up with patient by phone over the next few days.  [facility]  [place], [place]  Main Phone: [phone]  E-mail: [email or web address]  Website: [email or web address]

## 2021-07-29 ENCOUNTER — Other Ambulatory Visit (HOSPITAL_COMMUNITY)
Admission: RE | Admit: 2021-07-29 | Discharge: 2021-07-29 | Disposition: A | Discharge status: Home / Self Care | Payer: Medicare Other | Source: Ambulatory Visit | Attending: Plastic Surgery | Admitting: Plastic Surgery

## 2021-07-29 ENCOUNTER — Other Ambulatory Visit: Payer: Self-pay

## 2021-07-29 ENCOUNTER — Ambulatory Visit (INDEPENDENT_AMBULATORY_CARE_PROVIDER_SITE_OTHER): Payer: Medicare Other | Admitting: Plastic Surgery

## 2021-07-29 ENCOUNTER — Encounter: Payer: Self-pay | Admitting: Plastic Surgery

## 2021-07-29 VITALS — BP 180/62 | HR 62

## 2021-07-29 DIAGNOSIS — M25551 Pain in right hip: Secondary | ICD-10-CM

## 2021-07-29 DIAGNOSIS — L723 Sebaceous cyst: Secondary | ICD-10-CM

## 2021-07-29 DIAGNOSIS — M169 Osteoarthritis of hip, unspecified: Secondary | ICD-10-CM

## 2021-07-29 DIAGNOSIS — L720 Epidermal cyst: Secondary | ICD-10-CM | POA: Diagnosis not present

## 2021-07-29 NOTE — Telephone Encounter (Signed)
Patient calls nurse line requesting refill on hydrocodone-acetaminophen. Patient had procedure today to remove cyst and was told that she would need to request medication from PCP office.   Please advise.   Talbot Grumbling, RN

## 2021-07-29 NOTE — Progress Notes (Signed)
Operative Note   DATE OF OPERATION: 07/29/2021  LOCATION:    SURGICAL DEPARTMENT: Plastic Surgery  PREOPERATIVE DIAGNOSES: Glabellar cyst  POSTOPERATIVE DIAGNOSES:  same  PROCEDURE:  Excision of glabellar cyst measuring 3 cm Complex closure measuring 3 cm  SURGEON: Talmadge Coventry, MD  ANESTHESIA:  Local  COMPLICATIONS: None.   INDICATIONS FOR PROCEDURE:  The patient, Donna Herrera is a 76 y.o. female born on 1946-04-01, is here for treatment of glabellar cyst MRN: [id number]  CONSENT:  Informed consent was obtained directly from the patient. Risks, benefits and alternatives were fully discussed. Specific risks including but not limited to bleeding, infection, hematoma, seroma, scarring, pain, infection, wound healing problems, and need for further surgery were all discussed. The patient did have an ample opportunity to have questions answered to satisfaction.   DESCRIPTION OF PROCEDURE:  Local anesthesia was administered. The patient's operative site was prepped and draped in a sterile fashion. A time out was performed and all information was confirmed to be correct.  The lesion was excised with a 15 blade.  Hemostasis was obtained.  Circumferential undermining was performed and the skin was advanced and closed in layers with interrupted buried Monocryl sutures and 5-0 fast gut for the skin.  The lesion excised measured 3 cm, and the total length of closure measured 3 cm.    The patient tolerated the procedure well.  There were no complications.

## 2021-07-30 ENCOUNTER — Telehealth: Payer: Self-pay

## 2021-07-30 MED ORDER — HYDROCODONE-ACETAMINOPHEN 5-325 MG PO TABS: 1.0000 | ORAL_TABLET | Freq: Four times a day (QID) | ORAL | 0 refills | Status: DC | PRN

## 2021-07-30 NOTE — Telephone Encounter (Signed)
Patient would like for [name] to let her know what she is supposed to do after her surgery yesterday.  She said that she would like to know how to take care of it and when to take the bandage off.  Please call.

## 2021-08-02 ENCOUNTER — Other Ambulatory Visit: Payer: Self-pay

## 2021-08-02 LAB — SURGICAL PATHOLOGY

## 2021-08-02 NOTE — Telephone Encounter (Signed)
Called and spoke with the patient on (07/30/21) regarding the message below.  Informed the patient that it's really nothing for her to do to the area.  She can remove the bandage when she wants to.    Patient asked if she need to remove the sutures.  Informed the patient that she's not to remove the suture because the sutures will dissolve on their own.  Also informed her that the swelling will go down as well.  Patient verbalized understanding and agreed.//AB/CMA

## 2021-08-04 MED ORDER — POTASSIUM CHLORIDE ER 20 MEQ PO TBCR: 1.0000 | EXTENDED_RELEASE_TABLET | ORAL | 0 refills | Status: DC | PRN

## 2021-08-09 ENCOUNTER — Ambulatory Visit: Payer: Medicare Other | Admitting: Podiatry

## 2021-08-09 ENCOUNTER — Ambulatory Visit: Payer: Medicare Other

## 2021-08-09 ENCOUNTER — Other Ambulatory Visit: Payer: Self-pay

## 2021-08-09 DIAGNOSIS — M79674 Pain in right toe(s): Secondary | ICD-10-CM

## 2021-08-09 DIAGNOSIS — M21619 Bunion of unspecified foot: Secondary | ICD-10-CM

## 2021-08-09 DIAGNOSIS — M79675 Pain in left toe(s): Secondary | ICD-10-CM | POA: Diagnosis not present

## 2021-08-09 DIAGNOSIS — M7671 Peroneal tendinitis, right leg: Secondary | ICD-10-CM

## 2021-08-09 DIAGNOSIS — B351 Tinea unguium: Secondary | ICD-10-CM | POA: Diagnosis not present

## 2021-08-09 NOTE — Patient Instructions (Signed)
Look for Voltaren gel at the pharmacy over the counter or online (also known as diclofenac 1% gel). Apply to the painful areas 3-4x daily with the supplied dosing card. Allow to dry for 10 minutes before going into socks/shoes   Peroneal Tendinopathy Rehab Ask your health care provider which exercises are safe for you. Do exercises exactly as told by your health care provider and adjust them as directed. It is normal to feel mild stretching, pulling, tightness, or discomfort as you do these exercises. Stop right away if you feel sudden pain or your pain gets worse. Do not begin these exercises until told by your health care provider. Stretching and range-of-motion exercises These exercises warm up your muscles and joints and improve the movement and flexibility of your ankle. These exercises also help to relieve pain and stiffness. Gastroc and soleus stretch, standing  This is an exercise in which you stand on a step and use your body weight to stretch your calf muscles. To do this exercise: Stand on the edge of a step on the ball of your left / right foot. The ball of your foot is on the walking surface, right under your toes. Keep your other foot firmly on the same step. Hold on to the wall, a railing, or a chair for balance. Slowly lift your other foot, allowing your body weight to press your left / right heel down over the edge of the step. You should feel a stretch in your left / right calf (gastrocnemius and soleus). Hold this position for 15 seconds. Return both feet to the step. Repeat this exercise with a slight bend in your left / right knee. Repeat 5 times with your left / right knee straight and 5 times with your left / right knee bent. Complete this exercise 2 times a day. Strengthening exercises These exercises build strength and endurance in your foot and ankle. Endurance is the ability to use your muscles for a long time, even after they get tired. Ankle dorsiflexion with  band   Secure a rubber exercise band or tube to an object, such as a table leg, that will not move when the band is pulled. Secure the other end of the band around your left / right foot. Sit on the floor, facing the object with your left / right leg extended. The band or tube should be slightly tense when your foot is relaxed. Slowly flex your left / right ankle and toes to bring your foot toward you (dorsiflexion). Hold this position for 15 seconds. Let the band or tube slowly pull your foot back to the starting position. Repeat 5 times. Complete this exercise 2 times a day. Ankle eversion Sit on the floor with your legs straight out in front of you. Loop a rubber exercise band or tube around the ball of your left / right foot. The ball of your foot is on the walking surface, right under your toes. Hold the ends of the band in your hands, or secure the band to a stable object. The band or tube should be slightly tense when your foot is relaxed. Slowly push your foot outward, away from your other leg (eversion). Hold this position for 15 seconds. Slowly return your foot to the starting position. Repeat 5 times. Complete this exercise 2 times a day. Plantar flexion, standing  This exercise is sometimes called standing heel raise. Stand with your feet shoulder-width apart. Place your hands on a wall or table to steady yourself as   needed, but try not to use it for support. Keep your weight spread evenly over the width of your feet while you slowly rise up on your toes (plantar flexion). If told by your health care provider: Shift your weight toward your left / right leg until you feel challenged. Stand on your left / right leg only. Hold this position for 15 seconds. Repeat 2 times. Complete this exercise 2 times a day. Single leg stand Without shoes, stand near a railing or in a doorway. You may hold on to the railing or door frame as needed. Stand on your left / right foot. Keep your  big toe down on the floor and try to keep your arch lifted. Do not roll to the outside of your foot. If this exercise is too easy, you can try it with your eyes closed or while standing on a pillow. Hold this position for 15 seconds. Repeat 5 times. Complete this exercise 2 times a day. This information is not intended to replace advice given to you by your health care provider. Make sure you discuss any questions you have with your health care provider. Document Revised: 02/05/2019 Document Reviewed: 02/05/2019 Elsevier Patient Education  2020 Elsevier Inc.  

## 2021-08-09 NOTE — Progress Notes (Signed)
  Subjective:  Patient ID: Donna Herrera, female    DOB: Feb 09, 1946,  MRN: [id number]  Chief Complaint  Patient presents with   Bunions    Hallux valgus (acquired), right foot/Referring Provider: Zenia Resides    75 y.o. female presents with the above complaint. History confirmed with patient.  She has multiple issues including thickened elongated brown discolored painful toenails as well as pain on the outside of the right ankle and large bunions.  Objective:  Physical Exam: warm, good capillary refill, bunions, no trophic changes or ulcerative lesions, normal DP and PT pulses, and normal sensory exam. Left Foot: dystrophic yellowed discolored nail plates with subungual debris Right Foot: dystrophic yellowed discolored nail plates with subungual debris and pain along the peroneal tendons on the outside of the foot and distal to the ankle and with resisted eversion Assessment:   1. Bunion   2. Peroneal tendinitis of right lower extremity   3. Pain due to onychomycosis of toenails of both feet      Plan:  Patient was evaluated and treated and all questions answered.  Discussed the etiology and treatment options for the condition in detail with the patient. Educated patient on the topical and oral treatment options for mycotic nails. Recommended debridement of the nails today. Sharp and mechanical debridement performed of all painful and mycotic nails today. Nails debrided in length and thickness using a nail nipper to level of comfort. Discussed treatment options including appropriate shoe gear. Follow up as needed for painful nails.  We discussed etiology treatment options of bunions in detail.  I discussed with her that with the severity of her as they would require surgical intervention I think this would be very difficult for her at her age and functional level.  Recommended nonoperative treatment with padding and wearing wider shoes.  She also seems to have peroneal  tendinitis distal to the ankle joint.  I discussed with her therapeutic exercise and referred her to physical therapy at [facility].  Placed in a Tri-Lock ankle brace for support.  OTC NSAIDs and Voltaren gel which she has she will try this at home.  Return in about 8 weeks (around 10/04/2021) for re-check peroneal tendon.

## 2021-08-11 ENCOUNTER — Ambulatory Visit: Payer: Medicare Other | Admitting: Surgical

## 2021-08-11 ENCOUNTER — Encounter: Payer: Self-pay | Admitting: Surgical

## 2021-08-11 ENCOUNTER — Other Ambulatory Visit: Payer: Self-pay

## 2021-08-11 DIAGNOSIS — L723 Sebaceous cyst: Secondary | ICD-10-CM

## 2021-08-11 NOTE — Progress Notes (Signed)
Patient is a 75 year old female here for follow-up after excision of forehead cyst with Dr. Claudia Desanctis on 07/29/2021.  She is 2 weeks postop.  She is here with a family member.  She is doing well.  She has some questions about scarring.  We discussed the pathology report.  On exam midline glabellar and incision is intact and healing well.  There is no surrounding erythema or cellulitic change.  No subcutaneous fluid collection noted.  Sutures are dissolved.  Recommend sunscreen over the incision to prevent discoloration with sun exposure.  Recommend begin using scar cream in 1 week.  All of her questions were answered to her content.  There is no sign of infection on exam.

## 2021-08-18 ENCOUNTER — Other Ambulatory Visit: Payer: Self-pay

## 2021-08-18 ENCOUNTER — Ambulatory Visit: Payer: Medicare Other

## 2021-08-18 DIAGNOSIS — M1A09X0 Idiopathic chronic gout, multiple sites, without tophus (tophi): Secondary | ICD-10-CM

## 2021-08-18 MED ORDER — ALLOPURINOL 100 MG PO TABS: 100.0000 mg | ORAL_TABLET | Freq: Every day | ORAL | 2 refills | Status: DC

## 2021-08-18 MED ORDER — COLCHICINE 0.6 MG PO TABS: 0.6000 mg | ORAL_TABLET | Freq: Every day | ORAL | 2 refills | Status: DC

## 2021-08-18 NOTE — Chronic Care Management (AMB) (Signed)
Chronic Care Management   CCM RN Visit Note  08/18/2021 Name: [name] MRN: [id number] DOB: 23-Mar-1946  Subjective: Donna Herrera is a 75 y.o. year old female who is a primary care patient of [name], DO. The care management team was consulted for assistance with disease management and care coordination needs.    Engaged with patient by telephone for follow up visit in response to provider referral for case management and/or care coordination services.   Consent to Services:  The patient was given information about Chronic Care Management services, agreed to services, and gave verbal consent prior to initiation of services.  Please see initial visit note for detailed documentation.   Patient agreed to services and verbal consent obtained.   Assessment:  The patient is having some difficulty paying for her medications. . See Care Plan below for interventions and patient self-care actives. Follow up Plan: Patient would like continued follow-up.  CCM RNCM will outreach the patient within the next $ weeks.  Patient will call office if needed prior to next encounter  Assessment: Review of patient past medical history, allergies, medications, health status, including review of consultants reports, laboratory and other test data, was performed as part of comprehensive evaluation and provision of chronic care management services.   SDOH (Social Determinants of Health) assessments and interventions performed:  SDOH Interventions    Flowsheet Row Most Recent Value  SDOH Interventions   Financial Strain Interventions --  [A referral to pharamacy will be placed to help the patient with medication resources.]        CCM Care Plan  Allergies  Allergen Reactions   Crab [Shellfish Allergy]     Itching to lips   Morphine And Related Itching and Swelling   Atorvastatin Other (See Comments)    achiness    Outpatient Encounter Medications as of 08/18/2021   Medication Sig   acetaminophen (TYLENOL) 500 MG tablet Take 1,000 mg by mouth daily as needed for mild pain or moderate pain.   albuterol (PROVENTIL HFA;VENTOLIN HFA) 108 (90 Base) MCG/ACT inhaler Inhale 1-2 puffs into the lungs every 6 (six) hours as needed for wheezing.   aspirin 81 MG tablet Take 1 tablet (81 mg total) by mouth daily.   baclofen (LIORESAL) 10 MG tablet Take 1 tablet (10 mg total) by mouth 3 (three) times daily. Will refill for 2 month supply, please schedule appointment with PCP for follow-up for future refills.   Blood Glucose Monitoring Suppl (ONETOUCH VERIO) w/Device KIT Please use to check blood sugar once daily. E11.9   calcium carbonate (TUMS - DOSED IN MG ELEMENTAL CALCIUM) 500 MG chewable tablet Chew 1 tablet by mouth daily as needed for indigestion or heartburn.   carvedilol (COREG) 3.125 MG tablet Take 1 tablet (3.125 mg total) by mouth 2 (two) times daily.   cetirizine (ZYRTEC) 10 MG tablet Take 10 mg by mouth daily as needed for allergies.   chlorhexidine gluconate, MEDLINE KIT, (PERIDEX) 0.12 % solution Use as directed 15 mLs in the mouth or throat 2 (two) times daily.   diclofenac Sodium (VOLTAREN) 1 % GEL Apply 4 g topically 4 (four) times daily.   famotidine (PEPCID) 20 MG tablet Take 20 mg by mouth daily as needed for heartburn or indigestion.   fluticasone (FLONASE) 50 MCG/ACT nasal spray Place 2 sprays into both nostrils daily as needed for allergies or rhinitis.   furosemide (LASIX) 20 MG tablet TAKE 20 MG TABLET BY MOUTH ON Monday, WED, FRI   gabapentin (  NEURONTIN) 100 MG capsule TAKE 2 CAPSULES BY MOUTH AT BEDTIME.   hydrALAZINE (APRESOLINE) 50 MG tablet Take 1 tablet (50 mg total) by mouth 3 (three) times daily.   HYDROcodone-acetaminophen (NORCO) 5-325 MG tablet Take 1 tablet by mouth every 6 (six) hours as needed for moderate pain.   HYDROcodone-acetaminophen (NORCO/VICODIN) 5-325 MG tablet Take 1 tablet by mouth every 6 (six) hours as needed for  moderate pain.   Lancet Devices (ONE TOUCH DELICA LANCING DEV) MISC Please use to check blood sugar once daily. E11.9   Multiple Vitamin (MULTIVITAMIN WITH MINERALS) TABS tablet Take 1 tablet by mouth daily.   OneTouch Delica Lancets 16X MISC Please use to check blood sugar once daily. E11.9   ONETOUCH VERIO test strip PLEASE USE TO CHECK BLOOD SUGAR ONCE DAILY. E11.9   Potassium Chloride ER 20 MEQ TBCR Take 1 tablet by mouth as needed (When taking Lasix).   rosuvastatin (CRESTOR) 20 MG tablet Take 1 tablet (20 mg total) by mouth daily.   spironolactone (ALDACTONE) 25 MG tablet Take 0.5 tablets (12.5 mg total) by mouth daily.   triamcinolone ointment (KENALOG) 0.5 % Apply 1 application topically 2 (two) times daily.   [DISCONTINUED] allopurinol (ZYLOPRIM) 100 MG tablet TAKE 1 TABLET BY MOUTH EVERY DAY   [DISCONTINUED] colchicine 0.6 MG tablet Take 1 tablet (0.6 mg total) by mouth daily.   No facility-administered encounter medications on file as of 08/18/2021.    Patient Active Problem List   Diagnosis Date Noted   Positive depression screening 07/19/2021   Edentulous 07/19/2021   Urge incontinence 07/19/2021   Osteoarthritis of shoulders, bilateral 07/19/2021   mild cognitive impairment 07/19/2021   At high risk for falls 07/16/2021   Difficulty rising from chair 07/16/2021   Atherosclerosis of aorta ([place]) 07/15/2021   Dizziness 06/08/2021   Hallux valgus (acquired), left foot 06/08/2021   Numbness and tingling of both feet 06/08/2021   Emphysema lung ([place]) 03/02/2021   Pulmonary fibrosis ([place]) 03/02/2021   Pulmonary nodule 1 cm or greater in diameter 03/02/2021   Degenerative disc disease, cervical 11/26/2020   Rotator cuff arthropathy of both shoulders 11/26/2020   History of tobacco abuse 03/26/2020   Prediabetes 03/26/2020   Pain in both feet 10/28/2019   Essential hypertension 07/20/2017   NICM (nonischemic cardiomyopathy) ([place]) 06/09/2016   Heart failure, left, with LVEF  41-49% ([place]) 10/21/2015   Vitamin D deficiency 12/31/2014   Hyperlipidemia 09/16/2013   Hip pain, right 11/02/2012   DJD (degenerative joint disease) 10/12/2012   Encounter for chronic pain management 10/12/2012   Gout 10/12/2012   GERD (gastroesophageal reflux disease) 10/12/2012    Conditions to be addressed/monitored:COPD and Chronic Pain  Care Plan : RN Case Manager  Updates made by Lazaro Arms, RN since 08/18/2021 12:00 AM     Problem: COPD and Chronic Pain      Long-Range Goal: The patient will monitor and manage her COPD and Chronic pain   Start Date: 07/20/2021  Expected End Date: 09/29/2021  Priority: High  Note:   Current Barriers:  Knowledge Deficits related to plan of care for management of COPD and Chronic Pain   RNCM Clinical Goal(s):  Patient will verbalize understanding of plan for management of COPD and Chronic Pain and work to manage and prevent exacerbation of symptoms  through collaboration with RN Care manager, provider, and care team.   Interventions: 1:1 collaboration with primary care provider regarding development and update of comprehensive plan of care as evidenced by provider  attestation and co-signature Inter-disciplinary care team collaboration (see longitudinal plan of care) Evaluation of current treatment plan related to  self management and patient's adherence to plan as established by provider   COPD: (Status: New goal.) Provided patient with basic written and verbal COPD education on self care/management/and exacerbation prevention; Advised patient to track and manage COPD triggers;  Provided instruction about proper use of medications used for management of COPD including inhalers; Advised patient to self assesses COPD action plan zone and make appointment with provider if in the yellow zone for 48 hours without improvement; Provided education about and advised patient to utilize infection prevention strategies to reduce risk of respiratory  infection; Will send the patient educational material The patient was provided contact information for question or concerns 08/18/21: I talked with Mrs. Elon Spanner about her breathing, and she stated that she has some shortness of breath and may have pain in her chest with exertion. This situation is nothing new to her. She saw her cardiologist, who did a workup on her, and she said they gave her hydralazine, which seems to help her. She states that she takes her medication as prescribed. She has financial difficulties paying her bills and does not have enough money to get her prescriptions. She took Hydralazine, Colchicine, and Allopurinol. I advised her that I would send her a referral to the pharmacy.  Pain:  (Status: New goal.) Pain assessment performed Medications reviewed Reviewed provider established plan for pain management; Discussed importance of adherence to all scheduled medical appointments; Counseled on the importance of reporting any/all new or changed pain symptoms or management strategies to pain management provider; Discussed use of relaxation techniques and/or diversional activities to assist with pain reduction (distraction, imagery, relaxation, massage, acupressure, TENS, heat, and cold application; Reviewed with patient prescribed pharmacological and nonpharmacological pain relief strategies; Will send the patient Educational material The patient was provided contact information for question or concerns 08/18/21: Mrs. Elon Spanner said that as far as her pain, she has pain in her lower back and hips daily. She has pain medication that helps when her gout and other issues are not bothering her. Different methods tried are lying down to ease the pain or using some ointment or heat, but they may not relieve all of the pain. I advised her to continue using all of the modalities she is using.    Patient Goals/Self-Care Activities: Patient will self administer medications as  prescribed Patient will attend all scheduled provider appointments Patient will call pharmacy for medication refills Patient will call provider office for new concerns or questions       Lazaro Arms RN, BSN, [facility] Care Management Coordinator [place] Phone: [phone] I Fax: [phone]

## 2021-08-18 NOTE — Telephone Encounter (Signed)
[employer] RN calls nurse line reporting she spoke with patient yesterday and she complained of gout. RN reports she has an upcoming apt scheduled with new PCP on 11/3. Can we fill her gout medications until then. Please advise.

## 2021-08-18 NOTE — Patient Instructions (Signed)
Visit Information  Donna Herrera  it was nice speaking with you. Please call me directly [phone] if you have questions about the goals we discussed.   Goals Addressed               This Visit's Progress     Manage Chronic Pain (pt-stated)        Timeframe:  Long-Range Goal Priority:  High Start Date:         07/20/21                    Expected End Date:    09/29/21    - develop a personal pain management plan - track times pain is worst and when it is best - use ice or heat for pain relief - work slower and less intense when having pain    Why is this important?   Day-to-day life can be hard when you have chronic pain.  Pain medicine is just one piece of the treatment puzzle.  You can try these action steps to help you manage your pain.    Notes:       Track and Manage My Symptoms-COPD        Timeframe:  Long-Range Goal Priority:  High Start Date:       07/20/21                      Expected End Date:  09/29/21         - develop a rescue plan - follow rescue plan if symptoms flare-up - keep follow-up appointments    Why is this important?   Tracking your symptoms and other information about your health helps your doctor plan your care.  Write down the symptoms, the time of day, what you were doing and what medicine you are taking.  You will soon learn how to manage your symptoms.     Notes:         The patient verbalized understanding of instructions, educational materials, and care plan provided today and declined offer to receive copy of patient instructions, educational materials, and care plan.   Follow up Plan: Patient would like continued follow-up.  CCM RNCM will outreach the patient within the next 4 weeks.  Patient will call office if needed prior to next encounter  Lazaro Arms, RN  [phone]

## 2021-08-24 ENCOUNTER — Telehealth: Payer: Self-pay

## 2021-08-24 NOTE — Telephone Encounter (Signed)
   Telephone encounter was:  Successful.  08/24/2021 Name: Donna Herrera MRN: [id number] DOB: 1946-02-26  Donna Herrera is a 75 y.o. year old female who is a primary care patient of [name], DO . The community resource team was consulted for assistance with  Caregiver Support  Care guide performed the following interventions:  Advised patient of resources: [place], and [employer], [employer]ellery and information. This will be mail as well.  Follow Up Plan:  Care guide will follow up with patient by phone over the next few days to ensure she received mail.  Rew management  [place], [place]  Main Phone: [phone]  E-mail: [email or web address]  Website: [email or web address]

## 2021-08-26 ENCOUNTER — Telehealth: Payer: Self-pay

## 2021-08-26 NOTE — Telephone Encounter (Signed)
Called to speak with pt about medication costs.  Pt's children ended up paying for her medications. She's unsure why the colchicine is costing her $40+ for a 30-day supply, even though it does last her for more than a month. Could possibly be in the donut hole? I mentioned to her theres an assistance program for brand name colchicine (colcrys) if shes interested. Pt said she would give the office a call back if her copay was high next time.

## 2021-09-01 ENCOUNTER — Telehealth: Payer: Self-pay

## 2021-09-01 NOTE — Telephone Encounter (Signed)
   Telephone encounter was:  Successful.  09/01/2021 Name: Markeda Narvaez MRN: [id number] DOB: 1946-07-30  Caroll Weinheimer is a 75 y.o. year old female who is a primary care patient of [name], DO . The community resource team was consulted for assistance with  Caregiver Support  Care guide performed the following interventions:  Patient advised she received information in mail:  [employer], [employer], and [employer] contact and information. She stated at this time she does not have any further needs and will call my direct line if needed.   Follow Up Plan:  No further follow up planned at this time. The patient has been provided with needed resources.  [place] management  [place], [place]  Main Phone: [phone]  E-mail: [email or web address]  Website: [email or web address]

## 2021-09-02 ENCOUNTER — Other Ambulatory Visit: Payer: Self-pay

## 2021-09-02 ENCOUNTER — Encounter: Payer: Self-pay | Admitting: Family Medicine

## 2021-09-02 ENCOUNTER — Ambulatory Visit (INDEPENDENT_AMBULATORY_CARE_PROVIDER_SITE_OTHER): Payer: Medicare Other | Admitting: Family Medicine

## 2021-09-02 VITALS — BP 136/69 | HR 64 | Ht 64.0 in | Wt 186.0 lb

## 2021-09-02 DIAGNOSIS — M19211 Secondary osteoarthritis, right shoulder: Secondary | ICD-10-CM | POA: Diagnosis not present

## 2021-09-02 DIAGNOSIS — R202 Paresthesia of skin: Secondary | ICD-10-CM

## 2021-09-02 DIAGNOSIS — R7303 Prediabetes: Secondary | ICD-10-CM

## 2021-09-02 DIAGNOSIS — M12811 Other specific arthropathies, not elsewhere classified, right shoulder: Secondary | ICD-10-CM

## 2021-09-02 DIAGNOSIS — R200 Anesthesia of skin: Secondary | ICD-10-CM | POA: Diagnosis not present

## 2021-09-02 DIAGNOSIS — M19212 Secondary osteoarthritis, left shoulder: Secondary | ICD-10-CM

## 2021-09-02 DIAGNOSIS — R42 Dizziness and giddiness: Secondary | ICD-10-CM

## 2021-09-02 DIAGNOSIS — M12812 Other specific arthropathies, not elsewhere classified, left shoulder: Secondary | ICD-10-CM

## 2021-09-02 LAB — POCT GLYCOSYLATED HEMOGLOBIN (HGB A1C): HbA1c, POC (controlled diabetic range): 6.1 % (ref 0.0–7.0)

## 2021-09-02 MED ORDER — GABAPENTIN 300 MG PO CAPS: 300.0000 mg | ORAL_CAPSULE | Freq: Every day | ORAL | 0 refills | Status: DC

## 2021-09-02 MED ORDER — ALBUTEROL SULFATE HFA 108 (90 BASE) MCG/ACT IN AERS: 1.0000 | INHALATION_SPRAY | Freq: Four times a day (QID) | RESPIRATORY_TRACT | 0 refills | Status: DC | PRN

## 2021-09-02 MED ORDER — FAMOTIDINE 20 MG PO TABS: 20.0000 mg | ORAL_TABLET | Freq: Every day | ORAL | 0 refills | Status: DC | PRN

## 2021-09-02 NOTE — Patient Instructions (Signed)
It was great seeing you today!  I am sorry you are not feeling well. Today I put in for a referral for physical therapy to come to your house. We are also working on getting you home health so I will make sure CCM gets back to you about that.  I have increased your Gabapentin to 300mg  to take at night to help with your lower leg pain.   If you start to have chest pain, trouble breathing, or new and concerning symptoms please be seen at the ED.  Visit Reminders: - Stop by the pharmacy to pick up your prescriptions  - Continue to work on your healthy eating habits and incorporating exercise into your daily life.   Feel free to call with any questions or concerns at any time, at [phone].   Take care,  Dr. [name] [facility]

## 2021-09-02 NOTE — Progress Notes (Signed)
    SUBJECTIVE:   CHIEF COMPLAINT / HPI:   Ms. Gombos is a 75 yo who presents with complaints of feeling dizzy. States she feels weak on crutches as if she is about to fall. States she has had dry mouth though she has been drinking about 3 bottles a day. She also endorses a cough with mucous production that has been going on a while. Endorses having a gout flare a couple of weeks ago, not currently. Endorses chronic  bilateral lower leg pain that worsens at night. She also endorses chronic right shoulder pain. Denies injury or fall.   She also requests refills of albuterol inhaler and famotidine. Does not use albuterol regularly but grand daughter believes the one she had expired.   OBJECTIVE:   BP 136/69   Pulse 64   Ht 5\' 4"  (1.626 m)   Wt 186 lb (84.4 kg)   SpO2 100%   BMI 31.93 kg/m    General: chronically ill appearing, NAD CV: RRR no murmurs Resp: CTAB normal WOB GI: soft, non distended MSK: R shoulder without erythema or visible deformity. Decreased ROM. Unable to lift arm beyond about 45 degrees. Tender to palpation diffusely along deltoid and trapezius muscles   ASSESSMENT/PLAN:   No problem-specific Assessment & Plan notes found for this encounter.   Osteoarthritis of shoulders Worsening of chronic R shoulder pain. On exam no physical deformity though tender and with significantly decreased ROM. Known osteoarthritis and rotator cuff arthropathy. Discussed physical therapy which patient was amenable to. Also encouraged icing and continued voltaren gel. She takes hydrocodone-acetaminophen for chronic pain.   Dizziness/weakness, chronic  Notes it is difficult getting up from seated position, ambulating. Likely due to deconditioning and arthritis. Patient is high risk for falls. Emphasized importance of PT for mobility. CCM working on home health.   Lower leg pain, chronic Patient with chronic LE numbness, weakness, and pain worse at night. Increased Gabapentin from  200mg  to 300mg  at night  Prediabetes Last A1c 6.0 2 months ago, 6.1 today. Asymptomatic. Not on medication. At goal.      [name], [place]

## 2021-09-06 ENCOUNTER — Telehealth: Payer: Self-pay

## 2021-09-06 ENCOUNTER — Other Ambulatory Visit: Payer: Self-pay

## 2021-09-06 DIAGNOSIS — M169 Osteoarthritis of hip, unspecified: Secondary | ICD-10-CM

## 2021-09-06 DIAGNOSIS — M25551 Pain in right hip: Secondary | ICD-10-CM

## 2021-09-06 NOTE — Telephone Encounter (Signed)
Patient calls nurse line requesting referral for personal care services through [facility]. Please advise.   [place] Grumbling, RN

## 2021-09-13 MED ORDER — HYDROCODONE-ACETAMINOPHEN 5-325 MG PO TABS: 1.0000 | ORAL_TABLET | Freq: Four times a day (QID) | ORAL | 0 refills | Status: DC | PRN

## 2021-09-13 NOTE — Telephone Encounter (Signed)
Patient call nurse line checking the status of refill. Please advise.

## 2021-09-15 ENCOUNTER — Other Ambulatory Visit: Payer: Medicare Other | Admitting: *Deleted

## 2021-09-15 ENCOUNTER — Telehealth: Payer: Medicare Other

## 2021-09-15 ENCOUNTER — Other Ambulatory Visit: Payer: Self-pay

## 2021-09-15 DIAGNOSIS — I10 Essential (primary) hypertension: Secondary | ICD-10-CM

## 2021-09-15 DIAGNOSIS — I700 Atherosclerosis of aorta: Secondary | ICD-10-CM

## 2021-09-15 DIAGNOSIS — I5042 Chronic combined systolic (congestive) and diastolic (congestive) heart failure: Secondary | ICD-10-CM

## 2021-09-15 LAB — LIPID PANEL
Chol/HDL Ratio: 2.4 ratio (ref 0.0–4.4)
Cholesterol, Total: 148 mg/dL (ref 100–199)
HDL: 61 mg/dL (ref >39)
LDL Chol Calc (NIH): 74 mg/dL (ref 0–99)
Triglycerides: 67 mg/dL (ref 0–149)
VLDL Cholesterol Cal: 13 mg/dL (ref 5–40)

## 2021-09-17 ENCOUNTER — Other Ambulatory Visit: Payer: Self-pay | Admitting: Family Medicine

## 2021-09-17 ENCOUNTER — Ambulatory Visit: Payer: Medicare Other

## 2021-09-17 NOTE — Patient Instructions (Signed)
Visit Information  Donna Herrera  it was nice speaking with you. Please call me directly [phone] if you have questions about the goals we discussed.  Patient Goals/Self Care Activities: -Patient/Caregiver will self-administer medications as prescribed as evidenced by self-report/primary caregiver report , Patient/Caregiver will attend all scheduled provider appointments as evidenced by clinician review of documented attendance to scheduled appointments and patient/caregiver report, -Patient/Caregiver will call pharmacy for medication refills as evidenced by patient report and review of pharmacy fill history as appropriate,  -Patient/Caregiver will call provider office for new concerns or questions as evidenced by review of documented incoming telephone call notes and patient report,  -Patient/Caregiver verbalizes understanding of plan, and -Patient/Caregiver will focus on medication adherence by taking medications as prescribed -Avoid smoke and air pollution -Keep your airway clear from mucus build up  -Receive your annual flu vaccine -Will call pharmacy for medication refills 7 days prior to needed refill date     The patient verbalized understanding of instructions, educational materials, and care plan provided today and declined offer to receive copy of patient instructions, educational materials, and care plan.   Follow up Plan: Patient would like continued follow-up.  CCM RNCM will outreach the patient within the next 4 weeks.  Patient will call office if needed prior to next encounter  Lazaro Arms, RN  [phone]

## 2021-09-17 NOTE — Chronic Care Management (AMB) (Signed)
Chronic Care Management   CCM RN Visit Note  09/17/2021 Name: Donna Herrera MRN: [id number] DOB: 12/25/1945  Subjective: Donna Herrera is a 74 y.o. year old female who is a primary care patient of Paige, Victoria J, DO. The care management team was consulted for assistance with disease management and care coordination needs.    Engaged with patient by telephone for follow up visit in response to provider referral for case management and/or care coordination services.   Consent to Services:  The patient was given information about Chronic Care Management services, agreed to services, and gave verbal consent prior to initiation of services.  Please see initial visit note for detailed documentation.   Patient agreed to services and verbal consent obtained.    Assessment:  The patient  continues to experience difficulty with pain and aching in her shoulders back and knees.. See Care Plan below for interventions and patient self-care actives. Follow up Plan: Patient would like continued follow-up.  CCM RNCM will outreach the patient within the next 4 weeks.  Patient will call office if needed prior to next encounter Review of patient past medical history, allergies, medications, health status, including review of consultants reports, laboratory and other test data, was performed as part of comprehensive evaluation and provision of chronic care management services.   SDOH (Social Determinants of Health) assessments and interventions performed:    CCM Care Plan  Allergies  Allergen Reactions   Crab [Shellfish Allergy]     Itching to lips   Morphine And Related Itching and Swelling   Atorvastatin Other (See Comments)    achiness    Outpatient Encounter Medications as of 09/17/2021  Medication Sig   acetaminophen (TYLENOL) 500 MG tablet Take 1,000 mg by mouth daily as needed for mild pain or moderate pain.   albuterol (VENTOLIN HFA) 108 (90 Base) MCG/ACT inhaler  Inhale 1-2 puffs into the lungs every 6 (six) hours as needed for wheezing.   allopurinol (ZYLOPRIM) 100 MG tablet Take 1 tablet (100 mg total) by mouth daily.   aspirin 81 MG tablet Take 1 tablet (81 mg total) by mouth daily.   baclofen (LIORESAL) 10 MG tablet Take 1 tablet (10 mg total) by mouth 3 (three) times daily. Will refill for 2 month supply, please schedule appointment with PCP for follow-up for future refills.   Blood Glucose Monitoring Suppl (ONETOUCH VERIO) w/Device KIT Please use to check blood sugar once daily. E11.9   calcium carbonate (TUMS - DOSED IN MG ELEMENTAL CALCIUM) 500 MG chewable tablet Chew 1 tablet by mouth daily as needed for indigestion or heartburn.   carvedilol (COREG) 3.125 MG tablet Take 1 tablet (3.125 mg total) by mouth 2 (two) times daily.   cetirizine (ZYRTEC) 10 MG tablet Take 10 mg by mouth daily as needed for allergies.   chlorhexidine gluconate, MEDLINE KIT, (PERIDEX) 0.12 % solution Use as directed 15 mLs in the mouth or throat 2 (two) times daily.   colchicine 0.6 MG tablet Take 1 tablet (0.6 mg total) by mouth daily.   diclofenac Sodium (VOLTAREN) 1 % GEL Apply 4 g topically 4 (four) times daily.   famotidine (PEPCID) 20 MG tablet Take 1 tablet (20 mg total) by mouth daily as needed for heartburn or indigestion.   fluticasone (FLONASE) 50 MCG/ACT nasal spray Place 2 sprays into both nostrils daily as needed for allergies or rhinitis.   furosemide (LASIX) 20 MG tablet TAKE 20 MG TABLET BY MOUTH ON Monday, WED, FRI   gabapentin (NEURONTIN)   300 MG capsule Take 1 capsule (300 mg total) by mouth at bedtime.   hydrALAZINE (APRESOLINE) 50 MG tablet Take 1 tablet (50 mg total) by mouth 3 (three) times daily.   HYDROcodone-acetaminophen (NORCO) 5-325 MG tablet Take 1 tablet by mouth every 6 (six) hours as needed for moderate pain.   Lancet Devices (ONE TOUCH DELICA LANCING DEV) MISC Please use to check blood sugar once daily. E11.9   Multiple Vitamin  (MULTIVITAMIN WITH MINERALS) TABS tablet Take 1 tablet by mouth daily.   OneTouch Delica Lancets 74J MISC Please use to check blood sugar once daily. E11.9   ONETOUCH VERIO test strip PLEASE USE TO CHECK BLOOD SUGAR ONCE DAILY. E11.9   Potassium Chloride ER 20 MEQ TBCR Take 1 tablet by mouth as needed (When taking Lasix).   rosuvastatin (CRESTOR) 20 MG tablet Take 1 tablet (20 mg total) by mouth daily.   spironolactone (ALDACTONE) 25 MG tablet Take 0.5 tablets (12.5 mg total) by mouth daily.   triamcinolone ointment (KENALOG) 0.5 % Apply 1 application topically 2 (two) times daily.   No facility-administered encounter medications on file as of 09/17/2021.    Patient Active Problem List   Diagnosis Date Noted   Positive depression screening 07/19/2021   Edentulous 07/19/2021   Urge incontinence 07/19/2021   Osteoarthritis of shoulders, bilateral 07/19/2021   mild cognitive impairment 07/19/2021   At high risk for falls 07/16/2021   Difficulty rising from chair 07/16/2021   Atherosclerosis of aorta ([place]) 07/15/2021   Dizziness 06/08/2021   Hallux valgus (acquired), left foot 06/08/2021   Numbness and tingling of both feet 06/08/2021   Emphysema lung ([place]) 03/02/2021   Pulmonary fibrosis ([place]) 03/02/2021   Pulmonary nodule 1 cm or greater in diameter 03/02/2021   Degenerative disc disease, cervical 11/26/2020   Rotator cuff arthropathy of both shoulders 11/26/2020   History of tobacco abuse 03/26/2020   Prediabetes 03/26/2020   Pain in both feet 10/28/2019   Essential hypertension 07/20/2017   NICM (nonischemic cardiomyopathy) ([place]) 06/09/2016   Heart failure, left, with LVEF 41-49% ([place]) 10/21/2015   Vitamin D deficiency 12/31/2014   Hyperlipidemia 09/16/2013   Hip pain, right 11/02/2012   DJD (degenerative joint disease) 10/12/2012   Encounter for chronic pain management 10/12/2012   Gout 10/12/2012   GERD (gastroesophageal reflux disease) 10/12/2012    Conditions to be  addressed/monitored:COPD and Pain  Care Plan : RN Case Manager  Updates made by Lazaro Arms, RN since 09/17/2021 12:00 AM     Problem: COPD and Chronic Pain      Long-Range Goal: The patient will monitor and manage her COPD and Chronic pain   Start Date: 07/20/2021  Expected End Date: 10/29/2021  Priority: High  Note:   Current Barriers:  Knowledge Deficits related to plan of care for management of COPD and Chronic Pain   RNCM Clinical Goal(s):  Patient will verbalize understanding of plan for management of COPD and Chronic Pain and work to manage and prevent exacerbation of symptoms  through collaboration with Consulting civil engineer, provider, and care team.   Interventions: 1:1 collaboration with primary care provider regarding development and update of comprehensive plan of care as evidenced by provider attestation and co-signature Inter-disciplinary care team collaboration (see longitudinal plan of care) Evaluation of current treatment plan related to  self management and patient's adherence to plan as established by provider   COPD: (Status: Goal on Track (progressing): YES.) Provided patient with basic written and verbal COPD education on self  care/management/and exacerbation prevention; Advised patient to track and manage COPD triggers;  Provided instruction about proper use of medications used for management of COPD including inhalers; Advised patient to self assesses COPD action plan zone and make appointment with provider if in the yellow zone for 48 hours without improvement; Provided education about and advised patient to utilize infection prevention strategies to reduce risk of respiratory infection; Will send the patient educational material The patient was provided contact information for question or concerns 09/17/21:  Mrs. Leake is not having any issues wit her breathing at this time.  Pain:  (Status: New goal.) Pain assessment performed Medications reviewed Reviewed  provider established plan for pain management; Discussed importance of adherence to all scheduled medical appointments; Counseled on the importance of reporting any/all new or changed pain symptoms or management strategies to pain management provider; Discussed use of relaxation techniques and/or diversional activities to assist with pain reduction (distraction, imagery, relaxation, massage, acupressure, TENS, heat, and cold application; Reviewed with patient prescribed pharmacological and nonpharmacological pain relief strategies; Will send the patient Educational material The patient was provided contact information for question or concerns 09/17/21: I called and spoke with Mrs. Leake, who said she was doing well except for aching her shoulders, back, and hips. The Crestorshe is taking, she feels, may be causing her body to hurt more. She did receive her pain medication yesterday, and she took half of a pill last night; it helped to relieve some of the pain. I advised her to call and leave a message for her PCP to discuss the medication, and they may be able to adjust it for her. She also feels that one of her medications is drying her out or maybe affecting her kidneys. I told her I could refer her to the pharmacist, Who is better qualified to review her medications with her and answer her questions. I have also talked with the Referral coordinator at the office, who will speak with the PCP about a referral for home physical therapy requested by the patient. The patient also requested information for her Optometrist  Howard McFarland [address], [place], [place] [address]. Phone: [phone]  to make and eye appointment.  Patient Goals/Self Care Activities: -Patient/Caregiver will self-administer medications as prescribed as evidenced by self-report/primary caregiver report , Patient/Caregiver will attend all scheduled provider appointments as evidenced by clinician review of  documented attendance to scheduled appointments and patient/caregiver report, -Patient/Caregiver will call pharmacy for medication refills as evidenced by patient report and review of pharmacy fill history as appropriate,  -Patient/Caregiver will call provider office for new concerns or questions as evidenced by review of documented incoming telephone call notes and patient report,  -Patient/Caregiver verbalizes understanding of plan, and -Patient/Caregiver will focus on medication adherence by taking medications as prescribed -Avoid smoke and air pollution -Keep your airway clear from mucus build up  -Receive your annual flu vaccine -Will call pharmacy for medication refills 7 days prior to needed refill date          RN, BSN, [facility] Care Management Coordinator [facility] Phone: [phone] I Fax: [phone]           

## 2021-09-17 NOTE — Telephone Encounter (Signed)
Clarification was received from patient regarding home health vs an aide.  Patient is interested in home PT only.  Will send to MD to place referral for home health PT evaluation.  Tison Leibold,CMA

## 2021-09-24 ENCOUNTER — Other Ambulatory Visit: Payer: Self-pay | Admitting: Family Medicine

## 2021-09-27 ENCOUNTER — Other Ambulatory Visit: Payer: Self-pay | Admitting: Family Medicine

## 2021-09-27 DIAGNOSIS — M19211 Secondary osteoarthritis, right shoulder: Secondary | ICD-10-CM

## 2021-10-05 ENCOUNTER — Other Ambulatory Visit: Payer: Self-pay

## 2021-10-05 ENCOUNTER — Other Ambulatory Visit: Payer: Self-pay | Admitting: Physician Assistant

## 2021-10-05 ENCOUNTER — Other Ambulatory Visit: Payer: Self-pay | Admitting: Family Medicine

## 2021-10-05 DIAGNOSIS — M25551 Pain in right hip: Secondary | ICD-10-CM

## 2021-10-05 DIAGNOSIS — M169 Osteoarthritis of hip, unspecified: Secondary | ICD-10-CM

## 2021-10-06 MED ORDER — HYDROCODONE-ACETAMINOPHEN 5-325 MG PO TABS: 1.0000 | ORAL_TABLET | Freq: Four times a day (QID) | ORAL | 0 refills | Status: DC | PRN

## 2021-10-06 MED ORDER — FUROSEMIDE 20 MG PO TABS: ORAL_TABLET | ORAL | 2 refills | Status: DC

## 2021-10-07 ENCOUNTER — Other Ambulatory Visit: Payer: Self-pay

## 2021-10-09 MED ORDER — POTASSIUM CHLORIDE ER 20 MEQ PO TBCR: 1.0000 | EXTENDED_RELEASE_TABLET | ORAL | 0 refills | Status: DC | PRN

## 2021-10-15 ENCOUNTER — Ambulatory Visit: Payer: Medicare Other

## 2021-10-15 NOTE — Chronic Care Management (AMB) (Signed)
Chronic Care Management   CCM RN Visit Note  10/15/2021 Name: Donna Herrera MRN: [id number] DOB: Jul 04, 1946  Subjective: Donna Herrera is a 75 y.o. year old female who is a primary care patient of [name], DO. The care management team was consulted for assistance with disease management and care coordination needs.    Engaged with patient by telephone for follow up visit in response to provider referral for case management and/or care coordination services.   Consent to Services:  The patient was given information about Chronic Care Management services, agreed to services, and gave verbal consent prior to initiation of services.  Please see initial visit note for detailed documentation.   Patient agreed to services and verbal consent obtained.    Assessment: The patient continues to maintain positive progress with care plan goals. The patient is currently experiencing difficulty with more body ache than usual and feels that it may be coming from her cholesterol medication. See Care Plan below for interventions and patient self-care actives. Follow up Plan: Patient would like continued follow-up.  CCM RNCM will outreach the patient within the next 5 weeks.  Patient will call office if needed prior to next encounter  Review of patient past medical history, allergies, medications, health status, including review of consultants reports, laboratory and other test data, was performed as part of comprehensive evaluation and provision of chronic care management services.   SDOH (Social Determinants of Health) assessments and interventions performed:    CCM Care Plan  Allergies  Allergen Reactions   Crab [Shellfish Allergy]     Itching to lips   Morphine And Related Itching and Swelling   Atorvastatin Other (See Comments)    achiness    Outpatient Encounter Medications as of 10/15/2021  Medication Sig   acetaminophen (TYLENOL) 500 MG tablet Take 1,000 mg by  mouth daily as needed for mild pain or moderate pain.   albuterol (VENTOLIN HFA) 108 (90 Base) MCG/ACT inhaler Inhale 1-2 puffs into the lungs every 6 (six) hours as needed for wheezing.   allopurinol (ZYLOPRIM) 100 MG tablet Take 1 tablet (100 mg total) by mouth daily.   aspirin 81 MG tablet Take 1 tablet (81 mg total) by mouth daily.   baclofen (LIORESAL) 10 MG tablet Take 1 tablet (10 mg total) by mouth 3 (three) times daily. Will refill for 2 month supply, please schedule appointment with PCP for follow-up for future refills.   Blood Glucose Monitoring Suppl (ONETOUCH VERIO) w/Device KIT Please use to check blood sugar once daily. E11.9   calcium carbonate (TUMS - DOSED IN MG ELEMENTAL CALCIUM) 500 MG chewable tablet Chew 1 tablet by mouth daily as needed for indigestion or heartburn.   carvedilol (COREG) 3.125 MG tablet Take 1 tablet (3.125 mg total) by mouth 2 (two) times daily.   cetirizine (ZYRTEC) 10 MG tablet Take 10 mg by mouth daily as needed for allergies.   chlorhexidine gluconate, MEDLINE KIT, (PERIDEX) 0.12 % solution Use as directed 15 mLs in the mouth or throat 2 (two) times daily.   colchicine 0.6 MG tablet Take 1 tablet (0.6 mg total) by mouth daily.   diclofenac Sodium (VOLTAREN) 1 % GEL Apply 4 g topically 4 (four) times daily.   famotidine (PEPCID) 20 MG tablet TAKE 1 TABLET (20 MG TOTAL) BY MOUTH DAILY AS NEEDED FOR HEARTBURN OR INDIGESTION.   fluticasone (FLONASE) 50 MCG/ACT nasal spray Place 2 sprays into both nostrils daily as needed for allergies or rhinitis.   furosemide (LASIX)  20 MG tablet TAKE 20 MG TABLET BY MOUTH ON Monday, WED, FRI.   gabapentin (NEURONTIN) 300 MG capsule TAKE 1 CAPSULE BY MOUTH EVERYDAY AT BEDTIME   hydrALAZINE (APRESOLINE) 50 MG tablet Take 1 tablet (50 mg total) by mouth 3 (three) times daily.   HYDROcodone-acetaminophen (NORCO) 5-325 MG tablet Take 1 tablet by mouth every 6 (six) hours as needed for moderate pain.   Lancet Devices (ONE TOUCH  DELICA LANCING DEV) MISC Please use to check blood sugar once daily. E11.9   Multiple Vitamin (MULTIVITAMIN WITH MINERALS) TABS tablet Take 1 tablet by mouth daily.   OneTouch Delica Lancets 97L MISC Please use to check blood sugar once daily. E11.9   ONETOUCH VERIO test strip PLEASE USE TO CHECK BLOOD SUGAR ONCE DAILY. E11.9   Potassium Chloride ER 20 MEQ TBCR Take 1 tablet by mouth as needed (When taking Lasix).   rosuvastatin (CRESTOR) 20 MG tablet Take 1 tablet (20 mg total) by mouth daily.   spironolactone (ALDACTONE) 25 MG tablet Take 0.5 tablets (12.5 mg total) by mouth daily.   triamcinolone ointment (KENALOG) 0.5 % Apply 1 application topically 2 (two) times daily.   No facility-administered encounter medications on file as of 10/15/2021.    Patient Active Problem List   Diagnosis Date Noted   Positive depression screening 07/19/2021   Edentulous 07/19/2021   Urge incontinence 07/19/2021   Osteoarthritis of shoulders, bilateral 07/19/2021   mild cognitive impairment 07/19/2021   At high risk for falls 07/16/2021   Difficulty rising from chair 07/16/2021   Atherosclerosis of aorta ([place]) 07/15/2021   Dizziness 06/08/2021   Hallux valgus (acquired), left foot 06/08/2021   Numbness and tingling of both feet 06/08/2021   Emphysema lung ([place]) 03/02/2021   Pulmonary fibrosis ([place]) 03/02/2021   Pulmonary nodule 1 cm or greater in diameter 03/02/2021   Degenerative disc disease, cervical 11/26/2020   Rotator cuff arthropathy of both shoulders 11/26/2020   History of tobacco abuse 03/26/2020   Prediabetes 03/26/2020   Pain in both feet 10/28/2019   Essential hypertension 07/20/2017   NICM (nonischemic cardiomyopathy) ([place]) 06/09/2016   Heart failure, left, with LVEF 41-49% ([place]) 10/21/2015   Vitamin D deficiency 12/31/2014   Hyperlipidemia 09/16/2013   Hip pain, right 11/02/2012   DJD (degenerative joint disease) 10/12/2012   Encounter for chronic pain management 10/12/2012    Gout 10/12/2012   GERD (gastroesophageal reflux disease) 10/12/2012    Conditions to be addressed/monitored:COPD and Chronic pain  Care Plan : RN Case Manager  Updates made by Lazaro Arms, RN since 10/15/2021 12:00 AM     Problem: COPD and Chronic Pain      Long-Range Goal: The patient will monitor and manage her COPD and Chronic pain   Start Date: 07/20/2021  Expected End Date: 11/30/2021  Priority: High  Note:   Current Barriers:  Knowledge Deficits related to plan of care for management of COPD and Chronic Pain   RNCM Clinical Goal(s):  Patient will verbalize understanding of plan for management of COPD and Chronic Pain and work to manage and prevent exacerbation of symptoms  through collaboration with Consulting civil engineer, provider, and care team.   Interventions: 1:1 collaboration with primary care provider regarding development and update of comprehensive plan of care as evidenced by provider attestation and co-signature Inter-disciplinary care team collaboration (see longitudinal plan of care) Evaluation of current treatment plan related to  self management and patient's adherence to plan as established by provider   COPD: (Status:  Goal on Track (progressing): YES.) Long term Advised patient to track and manage COPD triggers;  Advised patient to self assesses COPD action plan zone and make appointment with provider if in the yellow zone for 48 hours without improvement; Provided education about and advised patient to utilize infection prevention strategies to reduce risk of respiratory infection; The patient was provided contact information for question or concerns 10/15/21: Mrs. Elon Spanner states that she has not had any issues with her breathing. She uses her inhaler when needed, and that is not much.  Pain:  (Status: New goal.) Long term Pain assessment performed Medications reviewed Reviewed provider established plan for pain management; Discussed importance of adherence to  all scheduled medical appointments; Counseled on the importance of reporting any/all new or changed pain symptoms or management strategies to pain management provider; Discussed use of relaxation techniques and/or diversional activities to assist with pain reduction (distraction, imagery, relaxation, massage, acupressure, TENS, heat, and cold application; Reviewed with patient prescribed pharmacological and nonpharmacological pain relief strategies; Will send the patient Educational material The patient was provided contact information for question or concerns 10/15/21: Her pain is an 8 /10. She said that the pain is from her shoulders down to her feet. It is throbbing and aching. She understands that she is older and things will hurt, but it should not bother her like this. She feels that it may have something to do with her cholesterol medication. She is taking Crestor. She was switched to this medication previously because of this same issue. I advised her to call and speak with the provider who prescribed it for her. She stated that she would.  Patient Goals/Self Care Activities: -Patient/Caregiver will self-administer medications as prescribed as evidenced by self-report/primary caregiver report , Patient/Caregiver will attend all scheduled provider appointments as evidenced by clinician review of documented attendance to scheduled appointments and patient/caregiver report, -Patient/Caregiver will call pharmacy for medication refills as evidenced by patient report and review of pharmacy fill history as appropriate,  -Patient/Caregiver will call provider office for new concerns or questions as evidenced by review of documented incoming telephone call notes and patient report,  -Patient/Caregiver verbalizes understanding of plan, and -Patient/Caregiver will focus on medication adherence by taking medications as prescribed -Avoid smoke and air pollution -Keep your airway clear from mucus build up   -Receive your annual flu vaccine -Will call pharmacy for medication refills 7 days prior to needed refill date        [place], BSN, [place] Phone: [phone] I Fax: [phone]

## 2021-10-15 NOTE — Patient Instructions (Signed)
Visit Information  [name]  it was nice speaking with you. Please call me directly [phone] if you have questions about the goals we discussed.  Patient Goals/Self Care Activities: -Patient/Caregiver will self-administer medications as prescribed as evidenced by self-report/primary caregiver report , Patient/Caregiver will attend all scheduled provider appointments as evidenced by clinician review of documented attendance to scheduled appointments and patient/caregiver report, -Patient/Caregiver will call pharmacy for medication refills as evidenced by patient report and review of pharmacy fill history as appropriate,  -Patient/Caregiver will call provider office for new concerns or questions as evidenced by review of documented incoming telephone call notes and patient report,  -Patient/Caregiver verbalizes understanding of plan, and -Patient/Caregiver will focus on medication adherence by taking medications as prescribed -Avoid smoke and air pollution -Keep your airway clear from mucus build up  -Receive your annual flu vaccine -Will call pharmacy for medication refills 7 days prior to needed refill date     The patient verbalized understanding of instructions, educational materials, and care plan provided today and declined offer to receive copy of patient instructions, educational materials, and care plan.   Follow up Plan: Patient would like continued follow-up.  CCM RNCM will outreach the patient within the next 5 weeks.  Patient will call office if needed prior to next encounter  Lazaro Arms, RN  [phone]

## 2021-10-22 ENCOUNTER — Ambulatory Visit: Payer: Medicare Other | Admitting: Family Medicine

## 2021-10-26 ENCOUNTER — Telehealth: Payer: Self-pay

## 2021-10-26 ENCOUNTER — Encounter: Payer: Self-pay | Admitting: Family Medicine

## 2021-10-26 ENCOUNTER — Other Ambulatory Visit: Payer: Self-pay

## 2021-10-26 ENCOUNTER — Ambulatory Visit (INDEPENDENT_AMBULATORY_CARE_PROVIDER_SITE_OTHER): Payer: Medicare Other | Admitting: Family Medicine

## 2021-10-26 VITALS — BP 127/62 | HR 62 | Ht 64.0 in | Wt 184.4 lb

## 2021-10-26 DIAGNOSIS — M1990 Unspecified osteoarthritis, unspecified site: Secondary | ICD-10-CM | POA: Insufficient documentation

## 2021-10-26 DIAGNOSIS — M19011 Primary osteoarthritis, right shoulder: Secondary | ICD-10-CM | POA: Diagnosis not present

## 2021-10-26 DIAGNOSIS — M19012 Primary osteoarthritis, left shoulder: Secondary | ICD-10-CM

## 2021-10-26 DIAGNOSIS — M169 Osteoarthritis of hip, unspecified: Secondary | ICD-10-CM

## 2021-10-26 DIAGNOSIS — M25551 Pain in right hip: Secondary | ICD-10-CM

## 2021-10-26 DIAGNOSIS — M1000 Idiopathic gout, unspecified site: Secondary | ICD-10-CM

## 2021-10-26 DIAGNOSIS — M158 Other polyosteoarthritis: Secondary | ICD-10-CM | POA: Diagnosis not present

## 2021-10-26 NOTE — Assessment & Plan Note (Signed)
-  given physical exam, no active gout flare-up or acute changes occurring -continue allopurinol daily with colchicine for flare-ups as appropriate -consider uric acid at next visit if presents with acute changes -diet changes discussed

## 2021-10-26 NOTE — Progress Notes (Signed)
° ° °  SUBJECTIVE:   CHIEF COMPLAINT / HPI:   Patient presents with right foot swelling intermittently for about 1-2 weeks. Takes allopurinol and takes colchicine with flares. She feels that the flares have worsened. Endorsing her entire foot feeling like this, she feels that it has improved since it started but still hurts. Eats chicken, beef, fish and really anything. Denies alcohol use. Has been eating a lot of red meats during the holidays which is when she noticed her gout seeming to flare up initially. But feels like gout is better today. Had participated in PT years ago but not recently.   Also with history of osteoarthritis and chronic pain. Endorsing that all joints aching especially in her shoulder. When asked she says, I guess that's what is going on. Voltaren gel helps the pain but otherwise aching is almost consistent.   OBJECTIVE:   BP 127/62    Pulse 62    Ht 5\' 4"  (1.626 m)    Wt 184 lb 6.4 oz (83.6 kg)    SpO2 100%    BMI 31.65 kg/m   General: Patient well-appearing, in no acute distress. CV: RRR, no murmurs auscultated Resp: CTAB MSK: no point tenderness noted, limited active ROM of both shoulder otherwise no limitations to ROM  Ext: no LE edema or pedal edema noted bilaterally, no swelling of toes bilaterally, no presence of tophi noted Neuro: ambulates with assistance of crutches  ASSESSMENT/PLAN:   Gout -given physical exam, no active gout flare-up or acute changes occurring -continue allopurinol daily with colchicine for flare-ups as appropriate -consider uric acid at next visit if presents with acute changes -diet changes discussed  Osteoarthritis -symptoms likely multifactorial given chronic pain, neuropathy and OA.  -continue current regimen inlcuding volatren gel -PT referral placed, explained to patient that activity is the best form of treatment which she voiced understanding and agreed -follow up with PCP as appropriate after starting PT   -PHQ-9 score of  2 with negative question 9 reviewed.   Donney Dice, [place]

## 2021-10-26 NOTE — Telephone Encounter (Signed)
Patient calls nurse line regarding referral for PT. Patient is requesting home health referral for PT. She states that she is unable to drive to outpatient center.   Will forward to referring provider and referral coordinator.   Talbot Grumbling, RN

## 2021-10-26 NOTE — Patient Instructions (Signed)
It was great seeing you today!  Today we discussed your aching and pains which are due to your osteoarthritis. I have placed a referral to physical therapy, if you do not hear from them within 1-2 weeks then please contact us so we can try to assist with scheduling.   Regarding your gout, it looks like you do not have a flare. Please make sure to take your allopurinol daily and colchicine with a flare-up. Try to limit red meats as well.   Please follow up at your next scheduled appointment, if anything arises between now and then, please don't hesitate to contact our office.   Thank you for allowing [name] to be a part of your medical care!  Thank you, Dr. Larae Grooms

## 2021-10-26 NOTE — Assessment & Plan Note (Addendum)
-  symptoms likely multifactorial given chronic pain, neuropathy and OA.  -continue current regimen inlcuding volatren gel -PT referral placed, explained to patient that activity is the best form of treatment which she voiced understanding and agreed -follow up with PCP as appropriate after starting PT

## 2021-10-27 ENCOUNTER — Other Ambulatory Visit: Payer: Self-pay | Admitting: Family Medicine

## 2021-10-27 DIAGNOSIS — M19012 Primary osteoarthritis, left shoulder: Secondary | ICD-10-CM

## 2021-10-27 NOTE — Telephone Encounter (Signed)
We don't need to place a new order for home health.  If you can put in a referral for CCM and they can help get her set up with transportation through her insurance company that can take her to [place]

## 2021-10-27 NOTE — Telephone Encounter (Signed)
Patient has a pending home health referral but I have been unable to find an agency that has the availability or is able to accept her insurance.  We may need to set patient up with CCM for transportation evaluation with her insurance and they can take her to outpatient PT.  Johnney Ou

## 2021-10-28 MED ORDER — HYDROCODONE-ACETAMINOPHEN 5-325 MG PO TABS: 1.0000 | ORAL_TABLET | Freq: Four times a day (QID) | ORAL | 0 refills | Status: DC | PRN

## 2021-10-28 NOTE — Telephone Encounter (Signed)
Patient calls nurse line stating she would prefer someone to come to her home for PT. I informed patient of the barriers to this, however she wants [name] to keep trying.   Will forward to referral coordinator.

## 2021-10-29 ENCOUNTER — Other Ambulatory Visit: Payer: Self-pay

## 2021-10-29 MED ORDER — BACLOFEN 10 MG PO TABS: 10.0000 mg | ORAL_TABLET | Freq: Three times a day (TID) | ORAL | 1 refills | Status: DC

## 2021-11-02 NOTE — Progress Notes (Deleted)
Cardiology Office Note:    Date:  11/02/2021   ID:  Donna Herrera, DOB 10/10/1946, MRN [id number]  PCP:  Donna Herrera, Donna Herrera Providers Cardiologist:  Donna Carnes, MD { Click to update primary MD,subspecialty MD or APP then REFRESH:1}  *** Referring MD: Donna Key, DO   Chief Complaint:  No chief complaint on file. {Click here for Visit Info    :1}   Patient Profile:   Donna Herrera is a 76 y.o. female with:  Chronic systolic CHF Echo 5/05: EF 15-20 Echo 8/19: EF 40-45 Non-Ischemic CM Tobacco use Hypertension  Hyperlipidemia Slipped Capital Femoral Epiphysis syndrome S/p multiple hip procedures  History of Present Illness: Donna Herrera has had CHF meds adjusted in the past due to low BP and managed with furosemide 3 x a week.  She last saw Donna Herrera in 9/22.  She had increased her Hydralazine due to increasing BPs and sent her to the Donna Herrera clinic.  At her last OV, her BP was noted to be s/w labile and recommendation was to use additional Hydralazine prn.  She returns for f/u.   ***      ASSESSMENT & PLAN:   No problem-specific Assessment & Plan notes found for this encounter. 1. Chronic combined systolic and diastolic CHF (congestive heart failure) (HCC) Nonischemic cardiomyopathy.  NYHA II-IIb.  Volume status stable.  Her blood pressures have remained stable since last seen.  I have recommended that we try her back on ACE inhibitor therapy with lisinopril 2.5 mg daily.  She does have a cough related to COPD.  She also notes intolerance to losartan.  If her cough worsens, we may need to stop this and try low-dose hydralazine.  Continue current dose of carvedilol, spironolactone.  Obtain BMET in 2 weeks.   2. Hypertensive heart disease with chronic combined systolic and diastolic congestive heart failure ([place]) The patient's blood pressure is controlled on her current regimen.  Continue current therapy.    3. Other chest pain She  has had some chest discomfort recently that is not typical for angina.  She has associated symptoms of dyspepsia.  I suspect that this is all related to a gastrointestinal etiology.  She had normal coronary arteries by cardiac catheterization 4 years ago.  I have encouraged her to use antiacid therapy.  I have also encouraged her to contact us if she has recurrent chest symptoms.  At that point, I would set her up for a Donna Herrera.      {Are you ordering a CV Procedure (e.g. stress test, cath, DCCV, TEE, etc)?   Press F2        :[id number]}   Dispo:  No follow-ups on file.    Prior CV studies: *** Echo 06/06/18 Mod LVH, EF 40-45, diff HK, Gr 1 DD, MAC, trivial MR, mild LAE, trivial TR   Echo 03/06/2018 EF 15-20, diffuse HK, grade 2 diastolic dysfunction, trivial AI, MAC, trivial MR, moderate LAE, normal RVSF, mild TR, PASP 62   LHC 05/11/16 1. Normal coronary anatomy 2. Severe LV dysfunction with EF 30-35%. 3. Normal right heart and LV filling pressures.   Echo 10/05/15 Mild LVH, EF 35-40%, diffuse HK, grade 2 diastolic dysfunction, MAC, mild MR, moderate LAE, normal RVSF, PASP 49 mmHg   Echo 01/16/15 EF 40%, diffuse HK, mild MR, moderate LAE, mild RAE, PASP 42  {Select studies to display:26339}    Past Medical History:  Diagnosis Date   Adenomatous polyp 03/23/2020  Colonoscopy 2021 notable for adenomatous polyp. Per GI, no further follow up colonoscopies recommended given patient's age.   Allergy    Arthritis    Atherosclerosis of aorta ([place]) 07/15/2021   Candidiasis, mouth 10/19/2017   Chronic combined systolic and diastolic CHF (congestive heart failure) ([place]) 10/21/2015   Echo 3/16: EF 40%, diffuse HK, mild MR, moderate LAE, mild RAE, PASP 42 mmHg  //  Echo 12/16: Mild LVH, EF 35-40%, diffuse HK, Gr 2 DD, MAC, mild MR, mod LAE, normal RVSF, PASP 49 mmHg // Echo 5/19: EF 15-20, diff HK, Gr 2 DD, trivial AI, MAC, trivial MR, mod LAE, normal RVSF, mild TR, PASP 62 // Limited Echo  8/19: mod LVH, EF 40-45, diff HK, Gr 1 DD, MAC, mild LAE    Chronic left shoulder pain 04/10/2013   Chronic lung disease    Chronic pain    Chronic pain syndrome 07/20/2017   Confusion 06/08/2021   Depression 08/25/2016   DJD (degenerative joint disease) 10/12/2012   Multiple joint replacements by Donna Herrera.    Dyshidrotic eczema 12/31/2014   Edentulous 07/19/2021   Emphysema lung ([place]) 03/02/2021   Long smoking history.  Noted on CT Low Dose Chest: 01/2021: IMPRESSION: Chronic interstitial changes raise the question of pulmonary fibrosis. High-resolution chest CT may prove helpful to further evaluate as clinically warranted.Emphysema (ICD10-J43.9) and Aortic Atherosclerosis (ICD10-170.0)  Recommended pulmonology referral but patient declined. Recommend continued discussion and symptom monitor   Essential hypertension 07/20/2017   GERD (gastroesophageal reflux disease)    Gout    H/O slipped capital femoral epiphysis (SCFE)    Heart failure, left, with LVEF 41-49% ([place]) 10/21/2015   transthoracic echocardiogram 06/06/18: EF appears improved to 40-45% with diffuse hypokinesis  A. Echo 3/16: EF 40%, diffuse HK, mild MR, moderate LAE, mild RAE, PASP 42 mmHg  //  B. Echo 12/16: Mild LVH, EF 35-40%, diffuse HK, grade 2 diastolic dysfunction, MAC, mild MR, moderate LAE, normal RVSF, PASP 49 mmHg   History of total right hip replacement 04/02/2019   Followed closely with orthopedist Donna Herrera. Patient is s/p total right hip replacement in 1980's with evidence of bone loss and osteopenia making reconstruction unlikely. Plan for conservative treatment at this time with crutches for ambulation and pain control.   Hyperlipidemia    Hypertension    Hypertensive heart disease with CHF (congestive heart failure) ([place]) 10/12/2012   Loss of taste 02/06/2018   Mass of breast, left 07/21/2014   NICM (nonischemic cardiomyopathy) ([place]) 06/09/2016   A. LHC 7/17: Normal coronary arteries, EF 30-35%, LVEDP 14 mmHg   Pain in  gums 04/18/2018   Pre-diabetes    Pulmonary fibrosis ([place]) 03/02/2021   CT Low Dose Chest: 01/2021: IMPRESSION: Chronic interstitial changes raise the question of pulmonary fibrosis. High-resolution chest CT may prove helpful to further evaluate as clinically warranted.Emphysema (ICD10-J43.9) and Aortic Atherosclerosis (ICD10-170.0)  Recommended pulmonology referral but patient declined. Recommend continued discussion and symptom monitoring.   Pulmonary nodule 1 cm or greater in diameter 03/02/2021   CT low dose Chest: 01/2021: 1. Lung-RADS 4A, suspicious. 10 mm posterior left lower lobe pulmonary nodule, possibly scarring. Follow up low-dose chest CT without contrast in 3 months (please use the following order, "CT CHEST LCS NODULE FOLLOW-UP W/O CM") is recommended. Alternatively, PET may be considered when there is a solid component 74m or larger.  Plan: Follow up low dose CT ordered in 3 mon   Rib pain on left side 09/17/2020   Right hip pain  11/02/2012   Tendonitis, Achilles, left 05/24/2017   Tobacco abuse 09/29/2014   Urge incontinence 07/19/2021   Vitamin D deficiency 12/31/2014   Current Medications: No outpatient medications have been marked as taking for the 11/03/21 encounter (Appointment) with [name] T, PA-C.    Allergies:   Crab [shellfish allergy], Morphine and related, and Atorvastatin   Social History   Tobacco Use   Smoking status: Former    Packs/day: 0.50    Years: 55.00    Pack years: 27.50    Types: Cigarettes    Quit date: 2017    Years since quitting: 6.0   Smokeless tobacco: Never  Vaping Use   Vaping Use: Never used  Substance Use Topics   Alcohol use: Not Currently    Comment: on holidays   Drug use: No    Family Hx: The patient's family history includes Cancer in her father; Depression in her mother; Diabetes in her mother and sister; Heart disease in her mother; Hypertension in her mother; Stomach cancer in her brother; Stroke in her mother; Throat cancer in  her son. There is no history of Breast cancer, Esophageal cancer, Colon cancer, or Colon polyps.  ROS   EKGs/Labs/Other Test Reviewed:    EKG:  EKG is *** ordered today.  The ekg ordered today demonstrates ***  Recent Labs: 02/09/2021: ALT 14 06/08/2021: BUN 15; Creatinine, Ser 0.89; Hemoglobin 13.3; Platelets 209; Potassium 4.7; Sodium 141   Recent Lipid Panel Lab Results  Component Value Date/Time   CHOL 148 09/15/2021 10:14 AM   TRIG 67 09/15/2021 10:14 AM   HDL 61 09/15/2021 10:14 AM   LDLCALC 74 09/15/2021 10:14 AM   LDLDIRECT 174 (H) 06/08/2021 12:15 PM     Risk Assessment/Calculations:   {Does this patient have ATRIAL FIBRILLATION?:[phone]}      Physical Exam:    VS:  There were no vitals taken for this visit.    Wt Readings from Last 3 Encounters:  10/26/21 184 lb 6.4 oz (83.6 kg)  09/02/21 186 lb (84.4 kg)  07/21/21 183 lb 3.2 oz (83.1 kg)    Physical Exam ***    Medication Adjustments/Labs and Tests Ordered: Current medicines are reviewed at length with the patient today.  Concerns regarding medicines are outlined above.  Tests Ordered: No orders of the defined types were placed in this encounter.  Medication Changes: No orders of the defined types were placed in this encounter.  Signed, [name], PA-C  11/02/2021 12:47 PM    [facility] [address], [place], [place]  [address] Phone: [phone]; Fax: [phone]

## 2021-11-02 NOTE — Telephone Encounter (Signed)
Referral may need to be updated.  At this time I will check with the agencies that were not able to offer services due to staffing to see if they can accept her now.  Mardell Cragg,CMA

## 2021-11-03 ENCOUNTER — Other Ambulatory Visit: Payer: Self-pay | Admitting: Family Medicine

## 2021-11-03 ENCOUNTER — Ambulatory Visit: Payer: Medicare Other | Admitting: Physician Assistant

## 2021-11-03 DIAGNOSIS — I10 Essential (primary) hypertension: Secondary | ICD-10-CM

## 2021-11-03 DIAGNOSIS — Z9181 History of falling: Secondary | ICD-10-CM

## 2021-11-03 DIAGNOSIS — I501 Left ventricular failure: Secondary | ICD-10-CM

## 2021-11-10 DIAGNOSIS — M19012 Primary osteoarthritis, left shoulder: Secondary | ICD-10-CM | POA: Diagnosis not present

## 2021-11-10 DIAGNOSIS — G3184 Mild cognitive impairment, so stated: Secondary | ICD-10-CM | POA: Diagnosis not present

## 2021-11-10 DIAGNOSIS — G629 Polyneuropathy, unspecified: Secondary | ICD-10-CM | POA: Diagnosis not present

## 2021-11-10 DIAGNOSIS — G8929 Other chronic pain: Secondary | ICD-10-CM | POA: Diagnosis not present

## 2021-11-10 DIAGNOSIS — M19011 Primary osteoarthritis, right shoulder: Secondary | ICD-10-CM | POA: Diagnosis not present

## 2021-11-10 DIAGNOSIS — R262 Difficulty in walking, not elsewhere classified: Secondary | ICD-10-CM | POA: Diagnosis not present

## 2021-11-10 DIAGNOSIS — Z9181 History of falling: Secondary | ICD-10-CM | POA: Diagnosis not present

## 2021-11-10 DIAGNOSIS — M10071 Idiopathic gout, right ankle and foot: Secondary | ICD-10-CM | POA: Diagnosis not present

## 2021-11-12 DIAGNOSIS — G8929 Other chronic pain: Secondary | ICD-10-CM | POA: Diagnosis not present

## 2021-11-12 DIAGNOSIS — M10071 Idiopathic gout, right ankle and foot: Secondary | ICD-10-CM | POA: Diagnosis not present

## 2021-11-12 DIAGNOSIS — R262 Difficulty in walking, not elsewhere classified: Secondary | ICD-10-CM | POA: Diagnosis not present

## 2021-11-12 DIAGNOSIS — Z9181 History of falling: Secondary | ICD-10-CM | POA: Diagnosis not present

## 2021-11-12 DIAGNOSIS — M19011 Primary osteoarthritis, right shoulder: Secondary | ICD-10-CM | POA: Diagnosis not present

## 2021-11-12 DIAGNOSIS — G3184 Mild cognitive impairment, so stated: Secondary | ICD-10-CM | POA: Diagnosis not present

## 2021-11-12 DIAGNOSIS — G629 Polyneuropathy, unspecified: Secondary | ICD-10-CM | POA: Diagnosis not present

## 2021-11-12 DIAGNOSIS — M19012 Primary osteoarthritis, left shoulder: Secondary | ICD-10-CM | POA: Diagnosis not present

## 2021-11-14 ENCOUNTER — Other Ambulatory Visit: Payer: Self-pay | Admitting: Family Medicine

## 2021-11-16 DIAGNOSIS — R262 Difficulty in walking, not elsewhere classified: Secondary | ICD-10-CM | POA: Diagnosis not present

## 2021-11-16 DIAGNOSIS — M19012 Primary osteoarthritis, left shoulder: Secondary | ICD-10-CM | POA: Diagnosis not present

## 2021-11-16 DIAGNOSIS — G629 Polyneuropathy, unspecified: Secondary | ICD-10-CM | POA: Diagnosis not present

## 2021-11-16 DIAGNOSIS — G8929 Other chronic pain: Secondary | ICD-10-CM | POA: Diagnosis not present

## 2021-11-16 DIAGNOSIS — M19011 Primary osteoarthritis, right shoulder: Secondary | ICD-10-CM | POA: Diagnosis not present

## 2021-11-16 DIAGNOSIS — G3184 Mild cognitive impairment, so stated: Secondary | ICD-10-CM | POA: Diagnosis not present

## 2021-11-16 DIAGNOSIS — Z9181 History of falling: Secondary | ICD-10-CM | POA: Diagnosis not present

## 2021-11-16 DIAGNOSIS — M10071 Idiopathic gout, right ankle and foot: Secondary | ICD-10-CM | POA: Diagnosis not present

## 2021-11-19 ENCOUNTER — Ambulatory Visit: Payer: Medicare Other

## 2021-11-19 DIAGNOSIS — G8929 Other chronic pain: Secondary | ICD-10-CM | POA: Diagnosis not present

## 2021-11-19 DIAGNOSIS — G3184 Mild cognitive impairment, so stated: Secondary | ICD-10-CM | POA: Diagnosis not present

## 2021-11-19 DIAGNOSIS — M19012 Primary osteoarthritis, left shoulder: Secondary | ICD-10-CM | POA: Diagnosis not present

## 2021-11-19 DIAGNOSIS — R262 Difficulty in walking, not elsewhere classified: Secondary | ICD-10-CM | POA: Diagnosis not present

## 2021-11-19 DIAGNOSIS — M10071 Idiopathic gout, right ankle and foot: Secondary | ICD-10-CM | POA: Diagnosis not present

## 2021-11-19 DIAGNOSIS — Z9181 History of falling: Secondary | ICD-10-CM | POA: Diagnosis not present

## 2021-11-19 DIAGNOSIS — M19011 Primary osteoarthritis, right shoulder: Secondary | ICD-10-CM | POA: Diagnosis not present

## 2021-11-19 DIAGNOSIS — G629 Polyneuropathy, unspecified: Secondary | ICD-10-CM | POA: Diagnosis not present

## 2021-11-19 NOTE — Patient Instructions (Signed)
Visit Information  Donna Herrera  it was nice speaking with you. Please call me directly [phone] if you have questions about the goals we discussed.  Patient Goals/Self Care Activities: -Patient/Caregiver will self-administer medications as prescribed as evidenced by self-report/primary caregiver report , Patient/Caregiver will attend all scheduled provider appointments as evidenced by clinician review of documented attendance to scheduled appointments and patient/caregiver report, -Patient/Caregiver will call pharmacy for medication refills as evidenced by patient report and review of pharmacy fill history as appropriate,  -Patient/Caregiver will call provider office for new concerns or questions as evidenced by review of documented incoming telephone call notes and patient report,  -Patient/Caregiver verbalizes understanding of plan, and -Patient/Caregiver will focus on medication adherence by taking medications as prescribed -Avoid smoke and air pollution -Keep your airway clear from mucus build up  -Will call pharmacy for medication refills 7 days prior to needed refill date -Patient will use exercises given by physical therapy      Patient verbalizes understanding of instructions and care plan provided today and agrees to view in [place]. Active MyChart status confirmed with patient.    Follow up Plan: Patient would like continued follow-up.  CCM RNCM will outreach the patient within the next 5 weeks  Patient will call office if needed prior to next encounter  Lazaro Arms, RN  [phone]

## 2021-11-19 NOTE — Chronic Care Management (AMB) (Signed)
Chronic Care Management   CCM RN Visit Note  11/19/2021 Name: Donna Herrera MRN: [id number] DOB: [birth year]  Subjective: Donna Herrera is a 76 y.o. year old female who is a primary care patient of [name], DO. The care management team was consulted for assistance with disease management and care coordination needs.    Engaged with patient by telephone for follow up visit in response to provider referral for case management and/or care coordination services.   Consent to Services:  The patient was given information about Chronic Care Management services, agreed to services, and gave verbal consent prior to initiation of services.  Please see initial visit note for detailed documentation.    Summary: The patient is making progress with her COPD, but feels that her pain is not getting better.  . See Care Plan below for interventions and patient self-care actives.  Recommendation: The patient may benefit from a discussion with a Pharmacist to answer questions that she has concerning her medications and the patient agrees.  I will make a referral to pharmacy.  Follow up Plan: Patient would like continued follow-up.  CCM RNCM will outreach the patient within the next 5 weeks.  Patient will call office if needed prior to next encounter   Patient agreed to services and verbal consent obtained.   Assessment: Review of patient past medical history, allergies, medications, health status, including review of consultants reports, laboratory and other test data, was performed as part of comprehensive evaluation and provision of chronic care management services.   SDOH (Social Determinants of Health) assessments and interventions performed:  No  CCM Care Plan   Conditions to be addressed/monitored:COPD and Pain  Care Plan : RN Case Manager  Updates made by Lazaro Arms, RN since 11/19/2021 12:00 AM     Problem: COPD and Chronic Pain      Long-Range Goal: The  patient will monitor and manage her COPD and Chronic pain   Start Date: 07/20/2021  Expected End Date: 01/28/2022  Priority: High  Note:   Current Barriers:  Knowledge Deficits related to plan of care for management of COPD and Chronic Pain   RNCM Clinical Goal(s):  Patient will verbalize understanding of plan for management of COPD and Chronic Pain and work to manage and prevent exacerbation of symptoms  through collaboration with Consulting civil engineer, provider, and care team.   Interventions: 1:1 collaboration with primary care provider regarding development and update of comprehensive plan of care as evidenced by provider attestation and co-signature Inter-disciplinary care team collaboration (see longitudinal plan of care) Evaluation of current treatment plan related to  self management and patient's adherence to plan as established by provider   COPD: (Status: Goal on Track (progressing): YES.) Long term Advised patient to track and manage COPD triggers;  Advised patient to self assesses COPD action plan zone and make appointment with provider if in the yellow zone for 48 hours without improvement; Provided education about and advised patient to utilize infection prevention strategies to reduce risk of respiratory infection; The patient was provided contact information for question or concerns 11/19/21: The patient states that her breathing is fine. She very rarely has to use her inhaler   Pain:  (Status: Goal on track: NO.) Long term Pain assessment performed- 7/10 Reviewed provider established plan for pain management; Discussed importance of adherence to all scheduled medical appointments; Counseled on the importance of reporting any/all new or changed pain symptoms or management strategies to pain management provider; Discussed use of relaxation  techniques and/or diversional activities to assist with pain reduction (distraction, imagery, relaxation, massage, acupressure, TENS, heat,  and cold application; Reviewed with patient prescribed pharmacological and nonpharmacological pain relief strategies; The patient was provided contact information for question or concerns 11/19/21: I spoke with Donna Herrera; she finished with the physical therapist. She states that she can't tell the difference between her pain. She feels it is helping with her balance. She thinks her cholesterol medication contributes to her pain, but she did not mention it during her visit. I advised her to have the pharmacist call her and discuss her medications. She would also like to discuss other medications. I informed her that I would send a referral to the pharmacist.   Patient Goals/Self Care Activities: -Patient/Caregiver will self-administer medications as prescribed as evidenced by self-report/primary caregiver report , Patient/Caregiver will attend all scheduled provider appointments as evidenced by clinician review of documented attendance to scheduled appointments and patient/caregiver report, -Patient/Caregiver will call pharmacy for medication refills as evidenced by patient report and review of pharmacy fill history as appropriate,  -Patient/Caregiver will call provider office for new concerns or questions as evidenced by review of documented incoming telephone call notes and patient report,  -Patient/Caregiver verbalizes understanding of plan, and -Patient/Caregiver will focus on medication adherence by taking medications as prescribed -Avoid smoke and air pollution -Keep your airway clear from mucus build up  -Will call pharmacy for medication refills 7 days prior to needed refill date -Patient will use exercises given by physical therapy       Lazaro Arms RN, BSN, [facility] Phone: [phone]

## 2021-11-20 ENCOUNTER — Other Ambulatory Visit: Payer: Self-pay | Admitting: Family Medicine

## 2021-11-23 DIAGNOSIS — G8929 Other chronic pain: Secondary | ICD-10-CM | POA: Diagnosis not present

## 2021-11-23 DIAGNOSIS — M19012 Primary osteoarthritis, left shoulder: Secondary | ICD-10-CM | POA: Diagnosis not present

## 2021-11-23 DIAGNOSIS — G629 Polyneuropathy, unspecified: Secondary | ICD-10-CM | POA: Diagnosis not present

## 2021-11-23 DIAGNOSIS — G3184 Mild cognitive impairment, so stated: Secondary | ICD-10-CM | POA: Diagnosis not present

## 2021-11-23 DIAGNOSIS — M10071 Idiopathic gout, right ankle and foot: Secondary | ICD-10-CM | POA: Diagnosis not present

## 2021-11-23 DIAGNOSIS — Z9181 History of falling: Secondary | ICD-10-CM | POA: Diagnosis not present

## 2021-11-23 DIAGNOSIS — R262 Difficulty in walking, not elsewhere classified: Secondary | ICD-10-CM | POA: Diagnosis not present

## 2021-11-23 DIAGNOSIS — M19011 Primary osteoarthritis, right shoulder: Secondary | ICD-10-CM | POA: Diagnosis not present

## 2021-11-26 ENCOUNTER — Other Ambulatory Visit: Payer: Self-pay

## 2021-11-26 DIAGNOSIS — M169 Osteoarthritis of hip, unspecified: Secondary | ICD-10-CM

## 2021-11-26 DIAGNOSIS — M25551 Pain in right hip: Secondary | ICD-10-CM

## 2021-11-26 DIAGNOSIS — G629 Polyneuropathy, unspecified: Secondary | ICD-10-CM | POA: Diagnosis not present

## 2021-11-26 DIAGNOSIS — R262 Difficulty in walking, not elsewhere classified: Secondary | ICD-10-CM | POA: Diagnosis not present

## 2021-11-26 DIAGNOSIS — G8929 Other chronic pain: Secondary | ICD-10-CM | POA: Diagnosis not present

## 2021-11-26 DIAGNOSIS — M10071 Idiopathic gout, right ankle and foot: Secondary | ICD-10-CM | POA: Diagnosis not present

## 2021-11-26 DIAGNOSIS — G3184 Mild cognitive impairment, so stated: Secondary | ICD-10-CM | POA: Diagnosis not present

## 2021-11-26 DIAGNOSIS — Z9181 History of falling: Secondary | ICD-10-CM | POA: Diagnosis not present

## 2021-11-26 DIAGNOSIS — M19011 Primary osteoarthritis, right shoulder: Secondary | ICD-10-CM | POA: Diagnosis not present

## 2021-11-26 DIAGNOSIS — M19012 Primary osteoarthritis, left shoulder: Secondary | ICD-10-CM | POA: Diagnosis not present

## 2021-11-29 MED ORDER — HYDROCODONE-ACETAMINOPHEN 5-325 MG PO TABS: 1.0000 | ORAL_TABLET | Freq: Four times a day (QID) | ORAL | 0 refills | Status: DC | PRN

## 2021-11-30 ENCOUNTER — Telehealth: Payer: Self-pay

## 2021-11-30 DIAGNOSIS — Z9181 History of falling: Secondary | ICD-10-CM | POA: Diagnosis not present

## 2021-11-30 DIAGNOSIS — G629 Polyneuropathy, unspecified: Secondary | ICD-10-CM | POA: Diagnosis not present

## 2021-11-30 DIAGNOSIS — R262 Difficulty in walking, not elsewhere classified: Secondary | ICD-10-CM | POA: Diagnosis not present

## 2021-11-30 DIAGNOSIS — M19012 Primary osteoarthritis, left shoulder: Secondary | ICD-10-CM | POA: Diagnosis not present

## 2021-11-30 DIAGNOSIS — G3184 Mild cognitive impairment, so stated: Secondary | ICD-10-CM | POA: Diagnosis not present

## 2021-11-30 DIAGNOSIS — M10071 Idiopathic gout, right ankle and foot: Secondary | ICD-10-CM | POA: Diagnosis not present

## 2021-11-30 DIAGNOSIS — M19011 Primary osteoarthritis, right shoulder: Secondary | ICD-10-CM | POA: Diagnosis not present

## 2021-11-30 DIAGNOSIS — G8929 Other chronic pain: Secondary | ICD-10-CM | POA: Diagnosis not present

## 2021-11-30 NOTE — Telephone Encounter (Signed)
[place] from [facility] requesting orders for OT evaluation.   Verbal orders given per [facility] protocol  Talbot Grumbling, RN

## 2021-12-09 ENCOUNTER — Telehealth: Payer: Self-pay

## 2021-12-09 DIAGNOSIS — M169 Osteoarthritis of hip, unspecified: Secondary | ICD-10-CM

## 2021-12-09 DIAGNOSIS — M25551 Pain in right hip: Secondary | ICD-10-CM

## 2021-12-09 NOTE — Telephone Encounter (Signed)
Patient calls nurse line regarding issues with hydrocodone-acetaminophen rx.   Called pharmacy. This medication is out of stock and is on back order. They do not know when this will be available again. Canceled prescription at [facility]. Patient will call to other local pharmacies to see where she can find the medication and then call back to have medication sent over.   Talbot Grumbling, RN

## 2021-12-10 ENCOUNTER — Telehealth: Payer: Self-pay

## 2021-12-10 DIAGNOSIS — Z9181 History of falling: Secondary | ICD-10-CM | POA: Diagnosis not present

## 2021-12-10 DIAGNOSIS — M19011 Primary osteoarthritis, right shoulder: Secondary | ICD-10-CM | POA: Diagnosis not present

## 2021-12-10 DIAGNOSIS — M10071 Idiopathic gout, right ankle and foot: Secondary | ICD-10-CM | POA: Diagnosis not present

## 2021-12-10 DIAGNOSIS — M19012 Primary osteoarthritis, left shoulder: Secondary | ICD-10-CM | POA: Diagnosis not present

## 2021-12-10 DIAGNOSIS — R262 Difficulty in walking, not elsewhere classified: Secondary | ICD-10-CM | POA: Diagnosis not present

## 2021-12-10 DIAGNOSIS — G629 Polyneuropathy, unspecified: Secondary | ICD-10-CM | POA: Diagnosis not present

## 2021-12-10 DIAGNOSIS — G8929 Other chronic pain: Secondary | ICD-10-CM | POA: Diagnosis not present

## 2021-12-10 DIAGNOSIS — G3184 Mild cognitive impairment, so stated: Secondary | ICD-10-CM | POA: Diagnosis not present

## 2021-12-10 NOTE — Telephone Encounter (Signed)
Will from [place] calling for OT verbal orders as follows:  2 time(s) weekly for 4 week(s) Verbal orders given per [facility] protocol  Talbot Grumbling, RN

## 2021-12-10 NOTE — Telephone Encounter (Signed)
Patient returns call to nurse line. Patient reports that she would like medication sent to the [facility] on [address].   Medication pended to this encounter.   Talbot Grumbling, RN

## 2021-12-13 MED ORDER — HYDROCODONE-ACETAMINOPHEN 5-325 MG PO TABS: 1.0000 | ORAL_TABLET | Freq: Four times a day (QID) | ORAL | 0 refills | Status: DC | PRN

## 2021-12-17 DIAGNOSIS — G8929 Other chronic pain: Secondary | ICD-10-CM | POA: Diagnosis not present

## 2021-12-17 DIAGNOSIS — G629 Polyneuropathy, unspecified: Secondary | ICD-10-CM | POA: Diagnosis not present

## 2021-12-17 DIAGNOSIS — G3184 Mild cognitive impairment, so stated: Secondary | ICD-10-CM | POA: Diagnosis not present

## 2021-12-17 DIAGNOSIS — Z9181 History of falling: Secondary | ICD-10-CM | POA: Diagnosis not present

## 2021-12-17 DIAGNOSIS — M19012 Primary osteoarthritis, left shoulder: Secondary | ICD-10-CM | POA: Diagnosis not present

## 2021-12-17 DIAGNOSIS — M10071 Idiopathic gout, right ankle and foot: Secondary | ICD-10-CM | POA: Diagnosis not present

## 2021-12-17 DIAGNOSIS — R262 Difficulty in walking, not elsewhere classified: Secondary | ICD-10-CM | POA: Diagnosis not present

## 2021-12-17 DIAGNOSIS — M19011 Primary osteoarthritis, right shoulder: Secondary | ICD-10-CM | POA: Diagnosis not present

## 2021-12-22 ENCOUNTER — Ambulatory Visit: Payer: Medicare Other

## 2021-12-22 DIAGNOSIS — G3184 Mild cognitive impairment, so stated: Secondary | ICD-10-CM | POA: Diagnosis not present

## 2021-12-22 DIAGNOSIS — M10071 Idiopathic gout, right ankle and foot: Secondary | ICD-10-CM | POA: Diagnosis not present

## 2021-12-22 DIAGNOSIS — M19011 Primary osteoarthritis, right shoulder: Secondary | ICD-10-CM | POA: Diagnosis not present

## 2021-12-22 DIAGNOSIS — G629 Polyneuropathy, unspecified: Secondary | ICD-10-CM | POA: Diagnosis not present

## 2021-12-22 DIAGNOSIS — G8929 Other chronic pain: Secondary | ICD-10-CM | POA: Diagnosis not present

## 2021-12-22 DIAGNOSIS — R262 Difficulty in walking, not elsewhere classified: Secondary | ICD-10-CM | POA: Diagnosis not present

## 2021-12-22 DIAGNOSIS — Z9181 History of falling: Secondary | ICD-10-CM | POA: Diagnosis not present

## 2021-12-22 DIAGNOSIS — M19012 Primary osteoarthritis, left shoulder: Secondary | ICD-10-CM | POA: Diagnosis not present

## 2021-12-23 DIAGNOSIS — M19012 Primary osteoarthritis, left shoulder: Secondary | ICD-10-CM | POA: Diagnosis not present

## 2021-12-23 DIAGNOSIS — G8929 Other chronic pain: Secondary | ICD-10-CM | POA: Diagnosis not present

## 2021-12-23 DIAGNOSIS — G629 Polyneuropathy, unspecified: Secondary | ICD-10-CM | POA: Diagnosis not present

## 2021-12-23 DIAGNOSIS — Z9181 History of falling: Secondary | ICD-10-CM | POA: Diagnosis not present

## 2021-12-23 DIAGNOSIS — M10071 Idiopathic gout, right ankle and foot: Secondary | ICD-10-CM | POA: Diagnosis not present

## 2021-12-23 DIAGNOSIS — M19011 Primary osteoarthritis, right shoulder: Secondary | ICD-10-CM | POA: Diagnosis not present

## 2021-12-23 DIAGNOSIS — G3184 Mild cognitive impairment, so stated: Secondary | ICD-10-CM | POA: Diagnosis not present

## 2021-12-23 DIAGNOSIS — R262 Difficulty in walking, not elsewhere classified: Secondary | ICD-10-CM | POA: Diagnosis not present

## 2021-12-23 NOTE — Patient Instructions (Signed)
Visit Information  Donna Herrera  it was nice speaking with you. Please call me directly [phone] if you have questions about the goals we discussed.    Patient Goals/Self Care Activities: -Patient/Caregiver will self-administer medications as prescribed as evidenced by self-report/primary caregiver report , Patient/Caregiver will attend all scheduled provider appointments as evidenced by clinician review of documented attendance to scheduled appointments and patient/caregiver report, -Patient/Caregiver will call pharmacy for medication refills as evidenced by patient report and review of pharmacy fill history as appropriate,  -Patient/Caregiver will call provider office for new concerns or questions as evidenced by review of documented incoming telephone call notes and patient report,  -Patient/Caregiver verbalizes understanding of plan,   -Patient/Caregiver will focus on medication adherence by taking medications as prescribed -Avoid smoke and air pollution -Keep your airway clear from mucus build up  -Will call pharmacy for medication refills 7 days prior to needed refill date -Patient will use exercises given by physical therapy       The patient verbalized understanding of instructions, educational materials, and care plan provided today and agreed to receive a mailed copy of patient instructions, educational materials, and care plan.   Follow up Plan: Patient would like continued follow-up.  CCM RNCM will outreach the patient within the next 5 weeks.  Patient will call office if needed prior to next encounter  Lazaro Arms, RN  [phone]

## 2021-12-23 NOTE — Chronic Care Management (AMB) (Signed)
Chronic Care Management   CCM RN Visit Note  12/23/2021 Name: Donna Herrera MRN: [id number] DOB: 1946/01/23  Subjective: Donna Herrera is a 76 y.o. year old female who is a primary care patient of Donna Key, DO. The care management team was consulted for assistance with disease management and care coordination needs.    Engaged with patient by telephone for follow up visit in response to provider referral for case management and/or care coordination services.   Consent to Services:  The patient was given information about Chronic Care Management services, agreed to services, and gave verbal consent prior to initiation of services.  Please see initial visit note for detailed documentation.   Patient agreed to services and verbal consent obtained.    Summary:  The patient continues to maintain positive progress with care plan goals. See Care Plan below for interventions and patient self-care actives.  Recommendation: The patient may benefit from continuing to take her medication as prescribed., continue with physical therapy, use other modalities like heat and ointments to provide pain relief.  The patient agrees.  Follow up Plan: Patient would like continued follow-up.  CCM RNCM will outreach the patient within the next 5 weeks.  Patient will call office if needed prior to next encounter   Assessment: Review of patient past medical history, allergies, medications, health status, including review of consultants reports, laboratory and other test data, was performed as part of comprehensive evaluation and provision of chronic care management services.   SDOH (Social Determinants of Health) assessments and interventions performed:    CCM Care Plan  Conditions to be addressed/monitored:COPD and Pain  Care Plan : RN Case Manager  Updates made by Donna Arms, RN since 12/23/2021 12:00 AM     Problem: COPD and Chronic Pain      Long-Range Goal: The patient  will monitor and manage her COPD and Chronic pain   Start Date: 07/20/2021  Expected End Date: 01/28/2022  Priority: High  Note:   Current Barriers:  Knowledge Deficits related to plan of care for management of COPD and Chronic Pain   RNCM Clinical Goal(s):  Patient will verbalize understanding of plan for management of COPD and Chronic Pain and work to manage and prevent exacerbation of symptoms  through collaboration with Consulting civil engineer, provider, and care team.   Interventions: 1:1 collaboration with primary care provider regarding development and update of comprehensive plan of care as evidenced by provider attestation and co-signature Inter-disciplinary care team collaboration (see longitudinal plan of care) Evaluation of current treatment plan related to  self management and patient's adherence to plan as established by provider   COPD: (Status: Goal on Track (progressing): YES.) Long term Advised patient to track and manage COPD triggers;  Advised patient to self assesses COPD action plan zone and make appointment with provider if in the yellow zone for 48 hours without improvement; Provided education about and advised patient to utilize infection prevention strategies to reduce risk of respiratory infection; The patient was provided contact information for question or concerns 12/22/21: The patient states that her breathing is fine. She very rarely has to use her inhaler but her inhaler is out of date.  I have advised heer to call the pharmacy and ask them to sen a refill request to the physician for her   Pain:  (Status: Goal on track: NO.) Long term Pain assessment performed- 7/10 Reviewed provider established plan for pain management; Discussed importance of adherence to all scheduled medical appointments; Counseled on  the importance of reporting any/all new or changed pain symptoms or management strategies to pain management provider; Discussed use of relaxation techniques  and/or diversional activities to assist with pain reduction (distraction, imagery, relaxation, massage, acupressure, TENS, heat, and cold application; Reviewed with patient prescribed pharmacological and nonpharmacological pain relief strategies; The patient was provided contact information for question or concerns 12/22/21: Mrs. Donna Herrera said she was doing well. She is starting Physical therapy for her shoulders and Herrera to help with strength and pain. Today she said she had no pain. She has an appointment with Dr. Arby Herrera on 01/04/22 at 130 pm.  Patient Goals/Self Care Activities: -Patient/Caregiver will self-administer medications as prescribed as evidenced by self-report/primary caregiver report , Patient/Caregiver will attend all scheduled provider appointments as evidenced by clinician review of documented attendance to scheduled appointments and patient/caregiver report, -Patient/Caregiver will call pharmacy for medication refills as evidenced by patient report and review of pharmacy fill history as appropriate,  -Patient/Caregiver will call provider office for new concerns or questions as evidenced by review of documented incoming telephone call notes and patient report,  -Patient/Caregiver verbalizes understanding of plan,   -Patient/Caregiver will focus on medication adherence by taking medications as prescribed -Avoid smoke and air pollution -Keep your airway clear from mucus build up  -Will call pharmacy for medication refills 7 days prior to needed refill date -Patient will use exercises given by physical therapy       Donna Arms RN, BSN, [place] Management Coordinator [facility]  Phone: [phone]

## 2021-12-26 ENCOUNTER — Other Ambulatory Visit: Payer: Self-pay | Admitting: Family Medicine

## 2021-12-27 DIAGNOSIS — Z20822 Contact with and (suspected) exposure to covid-19: Secondary | ICD-10-CM | POA: Diagnosis not present

## 2021-12-27 DIAGNOSIS — R519 Headache, unspecified: Secondary | ICD-10-CM | POA: Diagnosis not present

## 2021-12-27 DIAGNOSIS — J069 Acute upper respiratory infection, unspecified: Secondary | ICD-10-CM | POA: Diagnosis not present

## 2021-12-27 DIAGNOSIS — U071 COVID-19: Secondary | ICD-10-CM | POA: Diagnosis not present

## 2021-12-27 DIAGNOSIS — R051 Acute cough: Secondary | ICD-10-CM | POA: Diagnosis not present

## 2021-12-27 NOTE — Progress Notes (Unsigned)
Cardiology Office Note   Date:  12/27/2021   ID:  Donna Herrera, DOB 1946/01/09, MRN [id number]  PCP:  [name], DO  Cardiologist:   Dorris Carnes, MD   F/U of CHF     History of Present Illness: Donna Herrera is a 76 y.o. female with a history of systolic CHF   LVEF in 15 to 20% (Cath in 2017 normal)   Repeat in 2019 LVEF 40 to 54  Pt also has a hx of HTN, HL and tob use I last saw the pt in Dec 2021    Since then she was seen by M Supple    Pt says she takes BP at home  Up and down   REcently was 120  A  week go it was high   Has had some dizziness/ weakness over the summer   Denies CP   Breathing is stable   I saw the pt in Sept 2022     No outpatient medications have been marked as taking for the 12/29/21 encounter (Appointment) with Fay Records, MD.     Allergies:   Otho Darner allergy], Morphine and related, and Atorvastatin   Past Medical History:  Diagnosis Date   Adenomatous polyp 03/23/2020   Colonoscopy 2021 notable for adenomatous polyp. Per GI, no further follow up colonoscopies recommended given patient's age.   Allergy    Arthritis    Atherosclerosis of aorta ([place]) 07/15/2021   Candidiasis, mouth 10/19/2017   Chronic combined systolic and diastolic CHF (congestive heart failure) ([place]) 10/21/2015   Echo 3/16: EF 40%, diffuse HK, mild MR, moderate LAE, mild RAE, PASP 42 mmHg  //  Echo 12/16: Mild LVH, EF 35-40%, diffuse HK, Gr 2 DD, MAC, mild MR, mod LAE, normal RVSF, PASP 49 mmHg // Echo 5/19: EF 15-20, diff HK, Gr 2 DD, trivial AI, MAC, trivial MR, mod LAE, normal RVSF, mild TR, PASP 62 // Limited Echo 8/19: mod LVH, EF 40-45, diff HK, Gr 1 DD, MAC, mild LAE    Chronic left shoulder pain 04/10/2013   Chronic lung disease    Chronic pain    Chronic pain syndrome 07/20/2017   Confusion 06/08/2021   Depression 08/25/2016   DJD (degenerative joint disease) 10/12/2012   Multiple joint replacements by Dr. Collier Salina.    Dyshidrotic  eczema 12/31/2014   Edentulous 07/19/2021   Emphysema lung ([place]) 03/02/2021   Long smoking history.  Noted on CT Low Dose Chest: 01/2021: IMPRESSION: Chronic interstitial changes raise the question of pulmonary fibrosis. High-resolution chest CT may prove helpful to further evaluate as clinically warranted.Emphysema (ICD10-J43.9) and Aortic Atherosclerosis (ICD10-170.0)  Recommended pulmonology referral but patient declined. Recommend continued discussion and symptom monitor   Essential hypertension 07/20/2017   GERD (gastroesophageal reflux disease)    Gout    H/O slipped capital femoral epiphysis (SCFE)    Heart failure, left, with LVEF 41-49% ([place]) 10/21/2015   transthoracic echocardiogram 06/06/18: EF appears improved to 40-45% with diffuse hypokinesis  A. Echo 3/16: EF 40%, diffuse HK, mild MR, moderate LAE, mild RAE, PASP 42 mmHg  //  B. Echo 12/16: Mild LVH, EF 35-40%, diffuse HK, grade 2 diastolic dysfunction, MAC, mild MR, moderate LAE, normal RVSF, PASP 49 mmHg   History of total right hip replacement 04/02/2019   Followed closely with orthopedist Dr. Alvan Dame. Patient is s/p total right hip replacement in 1980's with evidence of bone loss and osteopenia making reconstruction unlikely. Plan for conservative treatment at this time with  crutches for ambulation and pain control.   Hyperlipidemia    Hypertension    Hypertensive heart disease with CHF (congestive heart failure) ([place]) 10/12/2012   Loss of taste 02/06/2018   Mass of breast, left 07/21/2014   NICM (nonischemic cardiomyopathy) ([place]) 06/09/2016   A. LHC 7/17: Normal coronary arteries, EF 30-35%, LVEDP 14 mmHg   Pain in gums 04/18/2018   Pre-diabetes    Pulmonary fibrosis ([place]) 03/02/2021   CT Low Dose Chest: 01/2021: IMPRESSION: Chronic interstitial changes raise the question of pulmonary fibrosis. High-resolution chest CT may prove helpful to further evaluate as clinically warranted.Emphysema (ICD10-J43.9) and Aortic Atherosclerosis (ICD10-170.0)   Recommended pulmonology referral but patient declined. Recommend continued discussion and symptom monitoring.   Pulmonary nodule 1 cm or greater in diameter 03/02/2021   CT low dose Chest: 01/2021: 1. Lung-RADS 4A, suspicious. 10 mm posterior left lower lobe pulmonary nodule, possibly scarring. Follow up low-dose chest CT without contrast in 3 months (please use the following order, "CT CHEST LCS NODULE FOLLOW-UP W/O CM") is recommended. Alternatively, PET may be considered when there is a solid component 36m or larger.  Plan: Follow up low dose CT ordered in 3 mon   Rib pain on left side 09/17/2020   Right hip pain 11/02/2012   Tendonitis, Achilles, left 05/24/2017   Tobacco abuse 09/29/2014   Urge incontinence 07/19/2021   Vitamin D deficiency 12/31/2014    Past Surgical History:  Procedure Laterality Date   CARDIAC CATHETERIZATION  2005   normal-[place]   CARDIAC CATHETERIZATION N/A 05/11/2016   Procedure: Right/Left Heart Cath and Coronary Angiography;  Surgeon: Peter M JMartinique MD;  [facility];  Service: Cardiovascular;  Laterality: N/A;   CESAREAN SECTION     COLONOSCOPY  1990's ??   HIP FUSION Left    left    JOINT REPLACEMENT Bilateral 1991, 1997   [place]   ROTATOR CUFF REPAIR     bilateral   TOOTH EXTRACTION N/A 04/03/2020   Procedure: DENTAL RESTORATION/EXTRACTIONS;  Surgeon: JDiona Browner DDS;  Location: [facility]  Service: Oral Surgery;  Laterality: N/A;   TOTAL HIP ARTHROPLASTY  1987   right. Dr. ACollier Salina  TUBAL LIGATION       Social History:  The patient  reports that she quit smoking about 6 years ago. Her smoking use included cigarettes. She has a 27.50 pack-year smoking history. She has never used smokeless tobacco. She reports that she does not currently use alcohol. She reports that she does not use drugs.   Family History:  The patient's family history includes Cancer in her father; Depression in her mother; Diabetes in her mother and sister; Heart  disease in her mother; Hypertension in her mother; Stomach cancer in her brother; Stroke in her mother; Throat cancer in her son.    ROS:  Please see the history of present illness. All other systems are reviewed and  Negative to the above problem except as noted.    PHYSICAL EXAM: VS:  There were no vitals taken for this visit.  GEN: Obese 76yo in no acute distress  HEENT: normal  Neck: no JVD   Cardiac: RRR; no murmurs   No LE  edema  Respiratory:  clear to auscultation bilaterally GI: soft, nontender, nondistended, + BS  MS: no deformity Moving all extremities   Skin: warm and dry, no rash Neuro: Deferred   EKG:  EKG is ordered today.  Sinus bradycardia   55 bpm   Occasional PVC  Prior CV Studies: Echo 06/06/18 Mod LVH, EF 40-45, diff HK, Gr 1 DD, MAC, trivial MR, mild LAE, trivial TR   Echo 03/06/2018 EF 15-20, diffuse HK, grade 2 diastolic dysfunction, trivial AI, MAC, trivial MR, moderate LAE, normal RVSF, mild TR, PASP 62   LHC 05/11/16 1. Normal coronary anatomy 2. Severe LV dysfunction with EF 30-35%. 3. Normal right heart and LV filling pressures. Lipid Panel    Component Value Date/Time   CHOL 148 09/15/2021 1014   TRIG 67 09/15/2021 1014   HDL 61 09/15/2021 1014   CHOLHDL 2.4 09/15/2021 1014   CHOLHDL 3.5 10/04/2015 0141   VLDL 16 10/04/2015 0141   LDLCALC 74 09/15/2021 1014   LDLDIRECT 174 (H) 06/08/2021 1215      Wt Readings from Last 3 Encounters:  10/26/21 184 lb 6.4 oz (83.6 kg)  09/02/21 186 lb (84.4 kg)  07/21/21 183 lb 3.2 oz (83.1 kg)      ASSESSMENT AND PLAN:  1 Hx nonischemic cardiomyopathy  Volume status looks good    Her BP has limited titration of meds   Pt felt weak on Entresto    I would continue on current regimen    Keep tabs on BP   If high can take Herrera additional 1/2 of hydralazine    2  HTN   Keep on current meds   Since BP labile use prn hydralzaine bump 3  HL   Did nto tolerate lipitor   Achy   Stop  Given fact she has  atherosclerosis should be on statin   Will try Crestor   20 mg   F/U lipids and AST in 8 wks   Plan for f/u in 6 months   Current medicines are reviewed at length with the patient today.  The patient does not have concerns regarding medicines.  Signed, Dorris Carnes, MD  12/27/2021 9:06 AM    [address], [place], [place]  [address] Phone: [phone]; Fax: [phone]

## 2021-12-29 ENCOUNTER — Ambulatory Visit: Payer: Medicare Other | Admitting: Internal Medicine

## 2022-01-04 ENCOUNTER — Ambulatory Visit: Payer: Medicare Other | Admitting: Family Medicine

## 2022-01-04 DIAGNOSIS — M19012 Primary osteoarthritis, left shoulder: Secondary | ICD-10-CM | POA: Diagnosis not present

## 2022-01-04 DIAGNOSIS — G8929 Other chronic pain: Secondary | ICD-10-CM | POA: Diagnosis not present

## 2022-01-04 DIAGNOSIS — M19011 Primary osteoarthritis, right shoulder: Secondary | ICD-10-CM | POA: Diagnosis not present

## 2022-01-04 DIAGNOSIS — R262 Difficulty in walking, not elsewhere classified: Secondary | ICD-10-CM | POA: Diagnosis not present

## 2022-01-04 DIAGNOSIS — M10071 Idiopathic gout, right ankle and foot: Secondary | ICD-10-CM | POA: Diagnosis not present

## 2022-01-04 DIAGNOSIS — Z9181 History of falling: Secondary | ICD-10-CM | POA: Diagnosis not present

## 2022-01-04 DIAGNOSIS — G629 Polyneuropathy, unspecified: Secondary | ICD-10-CM | POA: Diagnosis not present

## 2022-01-04 DIAGNOSIS — G3184 Mild cognitive impairment, so stated: Secondary | ICD-10-CM | POA: Diagnosis not present

## 2022-01-05 DIAGNOSIS — M10071 Idiopathic gout, right ankle and foot: Secondary | ICD-10-CM | POA: Diagnosis not present

## 2022-01-05 DIAGNOSIS — R262 Difficulty in walking, not elsewhere classified: Secondary | ICD-10-CM | POA: Diagnosis not present

## 2022-01-05 DIAGNOSIS — M19011 Primary osteoarthritis, right shoulder: Secondary | ICD-10-CM | POA: Diagnosis not present

## 2022-01-05 DIAGNOSIS — Z9181 History of falling: Secondary | ICD-10-CM | POA: Diagnosis not present

## 2022-01-05 DIAGNOSIS — M19012 Primary osteoarthritis, left shoulder: Secondary | ICD-10-CM | POA: Diagnosis not present

## 2022-01-05 DIAGNOSIS — G629 Polyneuropathy, unspecified: Secondary | ICD-10-CM | POA: Diagnosis not present

## 2022-01-05 DIAGNOSIS — G3184 Mild cognitive impairment, so stated: Secondary | ICD-10-CM | POA: Diagnosis not present

## 2022-01-05 DIAGNOSIS — G8929 Other chronic pain: Secondary | ICD-10-CM | POA: Diagnosis not present

## 2022-01-07 ENCOUNTER — Telehealth: Payer: Self-pay

## 2022-01-07 NOTE — Telephone Encounter (Signed)
Will from [facility] calling for OT verbal orders as follows: ? ?2 time(s) weekly for 1 week(s), then ?1 time(s) weekly for 3 week(s) ? ?Returned call to Will, provided Verbal orders on confidential VM per [facility] protocol.  ? ?Talbot Grumbling, RN ? ?

## 2022-01-11 ENCOUNTER — Other Ambulatory Visit: Payer: Self-pay | Admitting: *Deleted

## 2022-01-11 ENCOUNTER — Other Ambulatory Visit: Payer: Self-pay

## 2022-01-11 ENCOUNTER — Ambulatory Visit: Payer: Medicare Other

## 2022-01-11 DIAGNOSIS — M25551 Pain in right hip: Secondary | ICD-10-CM

## 2022-01-11 DIAGNOSIS — Z9181 History of falling: Secondary | ICD-10-CM | POA: Diagnosis not present

## 2022-01-11 DIAGNOSIS — M6281 Muscle weakness (generalized): Secondary | ICD-10-CM | POA: Diagnosis not present

## 2022-01-11 DIAGNOSIS — M169 Osteoarthritis of hip, unspecified: Secondary | ICD-10-CM

## 2022-01-11 DIAGNOSIS — M19011 Primary osteoarthritis, right shoulder: Secondary | ICD-10-CM | POA: Diagnosis not present

## 2022-01-11 DIAGNOSIS — G8929 Other chronic pain: Secondary | ICD-10-CM | POA: Diagnosis not present

## 2022-01-11 DIAGNOSIS — G629 Polyneuropathy, unspecified: Secondary | ICD-10-CM | POA: Diagnosis not present

## 2022-01-11 DIAGNOSIS — G3184 Mild cognitive impairment, so stated: Secondary | ICD-10-CM | POA: Diagnosis not present

## 2022-01-11 DIAGNOSIS — R262 Difficulty in walking, not elsewhere classified: Secondary | ICD-10-CM | POA: Diagnosis not present

## 2022-01-11 DIAGNOSIS — M19012 Primary osteoarthritis, left shoulder: Secondary | ICD-10-CM | POA: Diagnosis not present

## 2022-01-11 DIAGNOSIS — M10071 Idiopathic gout, right ankle and foot: Secondary | ICD-10-CM | POA: Diagnosis not present

## 2022-01-11 MED ORDER — HYDROCODONE-ACETAMINOPHEN 5-325 MG PO TABS: 1.0000 | ORAL_TABLET | Freq: Four times a day (QID) | ORAL | 0 refills | Status: DC | PRN

## 2022-01-12 ENCOUNTER — Other Ambulatory Visit: Payer: Self-pay | Admitting: Family Medicine

## 2022-01-12 ENCOUNTER — Other Ambulatory Visit: Payer: Self-pay | Admitting: Internal Medicine

## 2022-01-12 ENCOUNTER — Other Ambulatory Visit: Payer: Self-pay | Admitting: Physician Assistant

## 2022-01-12 DIAGNOSIS — I10 Essential (primary) hypertension: Secondary | ICD-10-CM

## 2022-01-12 DIAGNOSIS — I5042 Chronic combined systolic (congestive) and diastolic (congestive) heart failure: Secondary | ICD-10-CM

## 2022-01-12 DIAGNOSIS — I428 Other cardiomyopathies: Secondary | ICD-10-CM

## 2022-01-12 NOTE — Chronic Care Management (AMB) (Signed)
?Chronic Care Management  ? ?CCM RN Visit Note ? ?01/12/2022 ?Name: Donna Herrera MRN: [id number] DOB: 1946/10/31 ? ?Subjective: ?Donna Herrera is a 76 y.o. year old female who is a primary care patient of [name], DO. The care management team was consulted for assistance with disease management and care coordination needs.   ? ?Engaged with patient by telephone for follow up visit in response to provider referral for case management and/or care coordination services.  ? ?Consent to Services:  ?The patient was given information about Chronic Care Management services, agreed to services, and gave verbal consent prior to initiation of services.  Please see initial visit note for detailed documentation.  ? ?Patient agreed to services and verbal consent obtained.  ? ? ?Summary:  The patient  continues to experience difficulty with pain all over her body.Marland KitchenMarland KitchenShe feels that her cholesterol medication may be causing her to hurt .See Care Plan below for interventions and patient self-care actives. ? ?Recommendation: The patient may benefit from Take medications as prescribed, Exercise as tolerated, and use your heat and rubs that help to ease your pain. ? ?Follow up Plan: Patient would like continued follow-up.  CCM RNCM will outreach the patient within the next 5 weeks.  Patient will call office if needed prior to next encounter ? ? ?Assessment: Review of patient past medical history, allergies, medications, health status, including review of consultants reports, laboratory and other test data, was performed as part of comprehensive evaluation and provision of chronic care management services.  ? ?SDOH (Social Determinants of Health) assessments and interventions performed:  No ? ?CCM Care Plan ? ? ?Conditions to be addressed/monitored:COPD and Pain ? ?Care Plan : RN Case Manager  ?Updates made by Lazaro Arms, RN since 01/12/2022 12:00 AM  ?  ? ?Problem: COPD and Chronic Pain   ?  ? ?Long-Range  Goal: The patient will monitor and manage her COPD and Chronic pain   ?Start Date: 07/20/2021  ?Expected End Date: 03/30/2022  ?Priority: High  ?Note:   ?Current Barriers:  ?Knowledge Deficits related to plan of care for management of COPD and Chronic Pain  ? ?RNCM Clinical Goal(s):  ?Patient will verbalize understanding of plan for management of COPD and Chronic Pain and work to manage and prevent exacerbation of symptoms  through collaboration with RN Care manager, provider, and care team.  ? ?Interventions: ?1:1 collaboration with primary care provider regarding development and update of comprehensive plan of care as evidenced by provider attestation and co-signature ?Inter-disciplinary care team collaboration (see longitudinal plan of care) ?Evaluation of current treatment plan related to  self management and patient's adherence to plan as established by provider ? ? ?COPD: (Status: Goal on Track (progressing): YES.) Long term ?Advised patient to track and manage COPD triggers;  ?Advised patient to self assesses COPD action plan zone and make appointment with provider if in the yellow zone for 48 hours without improvement; ?Provided education about and advised patient to utilize infection prevention strategies to reduce risk of respiratory infection; ?The patient was provided contact information for question or concerns ?01/11/22: The patient states that stated that she had some issue with her breathing some episodes of shortness of breath and needed her inhaler filled but the inhaler she had was from the year 2021.  The inhaler that I show in the chart was albuterol and last filled on 11/22.  We did a conference call and spoke with the pharmacist Lowella Grip and she stated that there was only one inhaler  on file the albuterol inhaler from 11/22.  I told [name] I would send a message to the office and they said they said they would send  refill in for her. ? ? ? ?Pain:  (Status: Goal on track: NO.) Long term ?Pain  assessment performed- 7/10 ?Reviewed provider established plan for pain management; ?Discussed importance of adherence to all scheduled medical appointments; ?Counseled on the importance of reporting any/all new or changed pain symptoms or management strategies to pain management provider; ?Discussed use of relaxation techniques and/or diversional activities to assist with pain reduction (distraction, imagery, relaxation, massage, acupressure, TENS, heat, and cold application; ?Reviewed with patient prescribed pharmacological and nonpharmacological pain relief strategies; ?01/11/22: Mrs. Donna Herrera is doing physical therapy for her shoulders and arms to help with strength and pain. She said that after her session it hurts some but she feels that it helps.  She still complains of pain in her body and feels that her cholesterol medication is causing it.  I advised her to talk with Dr. Harrington Challenger about her medication.  She has an appointment with Dr Harrington Challenger on 3/21. ? ? ?Patient Goals/Self Care Activities: ?-Patient/Caregiver will self-administer medications as prescribed as evidenced by self-report/primary caregiver report , ?Patient/Caregiver will attend all scheduled provider appointments as evidenced by clinician review of documented attendance to scheduled appointments and patient/caregiver report, ?-Patient/Caregiver will call pharmacy for medication refills as evidenced by patient report and review of pharmacy fill history as appropriate, ? -Patient/Caregiver will call provider office for new concerns or questions as evidenced by review of documented incoming telephone call notes and patient report,  ?-Patient/Caregiver verbalizes understanding of plan,  ? -Patient/Caregiver will focus on medication adherence by taking medications as prescribed ?-Avoid smoke and air pollution ?-Keep your airway clear from mucus build up  ?-Will call pharmacy for medication refills 7 days prior to needed refill date ?-Patient will use exercises  given by physical therapy ? ? ? ?  ? ?Lazaro Arms RN, BSN, [place] ?Care Management Coordinator ?[place]  ?Phone: [phone]  ?  ? ? ? ? ? ? ? ? ?

## 2022-01-12 NOTE — Patient Instructions (Signed)
Visit Information ? ?Donna Herrera  it was nice speaking with you. Please call me directly [phone] if you have questions about the goals we discussed. ? ?Patient Goals/Self Care Activities: ?-Patient/Caregiver will self-administer medications as prescribed as evidenced by self-report/primary caregiver report , ?Patient/Caregiver will attend all scheduled provider appointments as evidenced by clinician review of documented attendance to scheduled appointments and patient/caregiver report, ?-Patient/Caregiver will call pharmacy for medication refills as evidenced by patient report and review of pharmacy fill history as appropriate, ? -Patient/Caregiver will call provider office for new concerns or questions as evidenced by review of documented incoming telephone call notes and patient report,  ?-Patient/Caregiver verbalizes understanding of plan,  ? -Patient/Caregiver will focus on medication adherence by taking medications as prescribed ?-Avoid smoke and air pollution ?-Keep your airway clear from mucus build up  ?-Will call pharmacy for medication refills 7 days prior to needed refill date ?-Patient will use exercises given by physical therapy ? ? ?The patient verbalized understanding of instructions, educational materials, and care plan provided today and agreed to receive a mailed copy of patient instructions, educational materials, and care plan.  ? ?Follow up Plan: Patient would like continued follow-up.  CCM RNCM will outreach the patient within the next 5 weeks.  Patient will call office if needed prior to next encounter ? ?Lazaro Arms, RN ? ?[phone]  ?

## 2022-01-13 DIAGNOSIS — M19011 Primary osteoarthritis, right shoulder: Secondary | ICD-10-CM | POA: Diagnosis not present

## 2022-01-13 DIAGNOSIS — Z9181 History of falling: Secondary | ICD-10-CM | POA: Diagnosis not present

## 2022-01-13 DIAGNOSIS — M10071 Idiopathic gout, right ankle and foot: Secondary | ICD-10-CM | POA: Diagnosis not present

## 2022-01-13 DIAGNOSIS — M6281 Muscle weakness (generalized): Secondary | ICD-10-CM | POA: Diagnosis not present

## 2022-01-13 DIAGNOSIS — G629 Polyneuropathy, unspecified: Secondary | ICD-10-CM | POA: Diagnosis not present

## 2022-01-13 DIAGNOSIS — G8929 Other chronic pain: Secondary | ICD-10-CM | POA: Diagnosis not present

## 2022-01-13 DIAGNOSIS — R262 Difficulty in walking, not elsewhere classified: Secondary | ICD-10-CM | POA: Diagnosis not present

## 2022-01-13 DIAGNOSIS — M19012 Primary osteoarthritis, left shoulder: Secondary | ICD-10-CM | POA: Diagnosis not present

## 2022-01-13 DIAGNOSIS — G3184 Mild cognitive impairment, so stated: Secondary | ICD-10-CM | POA: Diagnosis not present

## 2022-01-13 MED ORDER — ALBUTEROL SULFATE HFA 108 (90 BASE) MCG/ACT IN AERS: 1.0000 | INHALATION_SPRAY | Freq: Four times a day (QID) | RESPIRATORY_TRACT | 0 refills | Status: DC | PRN

## 2022-01-13 NOTE — Telephone Encounter (Signed)
2nd request for inhaler refill.  Donna Herrera,CMA ? ?

## 2022-01-17 NOTE — Progress Notes (Signed)
? ?Cardiology Office Note ? ? ?Date:  01/18/2022  ? ?ID:  Donna Herrera, DOB 07/28/1946, MRN [id number] ? ?PCP:  [name], DO  ?Cardiologist:   Dorris Carnes, MD  ? ?F/U of CHF   ?  ?History of Present Illness: ?Donna Herrera is a 76 y.o. female with a history of systolic CHF   LVEF in 15 to 20% (Cath in 2017 normal)   Repeat in 2019 LVEF 40 to 45  Pt also has a hx of HTN, HL and tob use ?I last saw the pt in Sept 2022    ? ?Sinc seen  has good days and bad days   Got COVID 2 wks ago   REcovering  This is 2nd time      ?Denies CP   Still gets SOB with walking   Using crutches      ?Denies wheezing  Says her mouth is always dry   ? ? ?Current Meds  ?Medication Sig  ? acetaminophen (TYLENOL) 500 MG tablet Take 1,000 mg by mouth daily as needed for mild pain or moderate pain.  ? albuterol (VENTOLIN HFA) 108 (90 Base) MCG/ACT inhaler Inhale 1-2 puffs into the lungs every 6 (six) hours as needed for wheezing.  ? allopurinol (ZYLOPRIM) 100 MG tablet Take 1 tablet (100 mg total) by mouth daily.  ? aspirin 81 MG tablet Take 1 tablet (81 mg total) by mouth daily.  ? atorvastatin (LIPITOR) 40 MG tablet Take 40 mg by mouth daily.  ? baclofen (LIORESAL) 10 MG tablet TAKE 1 TABLET (10 MG TOTAL) BY MOUTH 3 (THREE) TIMES DAILY. WILL REFILL FOR 2 MONTH SUPPLY, PLEASE SCHEDULE APPOINTMENT WITH PCP FOR FOLLOW-UP FOR FUTURE REFILLS. (Patient taking differently: Take 10 mg by mouth 2 (two) times daily. Will refill for 2 month supply, please schedule appointment with PCP for follow-up for future refills.)  ? benzonatate (TESSALON) 100 MG capsule as needed.  ? Blood Glucose Monitoring Suppl (ONETOUCH VERIO) w/Device KIT Please use to check blood sugar once daily. E11.9  ? calcium carbonate (TUMS - DOSED IN MG ELEMENTAL CALCIUM) 500 MG chewable tablet Chew 1 tablet by mouth daily as needed for indigestion or heartburn.  ? carvedilol (COREG) 3.125 MG tablet TAKE 1 TABLET BY MOUTH 2 TIMES DAILY.  ? cetirizine  (ZYRTEC) 10 MG tablet Take 10 mg by mouth daily as needed for allergies.  ? chlorhexidine gluconate, MEDLINE KIT, (PERIDEX) 0.12 % solution Use as directed 15 mLs in the mouth or throat 2 (two) times daily.  ? colchicine 0.6 MG tablet Take 1 tablet (0.6 mg total) by mouth daily. (Patient taking differently: Take 0.6 mg by mouth as needed.)  ? diclofenac Sodium (VOLTAREN) 1 % GEL Apply 4 g topically 4 (four) times daily.  ? famotidine (PEPCID) 20 MG tablet TAKE 1 TABLET (20 MG TOTAL) BY MOUTH DAILY AS NEEDED FOR HEARTBURN OR INDIGESTION.  ? fluticasone (FLONASE) 50 MCG/ACT nasal spray Place 2 sprays into both nostrils daily as needed for allergies or rhinitis.  ? furosemide (LASIX) 20 MG tablet TAKE 20 MG TABLET BY MOUTH ON Monday, WED, FRI  ? gabapentin (NEURONTIN) 300 MG capsule TAKE 1 CAPSULE BY MOUTH EVERYDAY AT BEDTIME  ? hydrALAZINE (APRESOLINE) 50 MG tablet TAKE 1 TABLET BY MOUTH THREE TIMES A DAY  ? HYDROcodone-acetaminophen (NORCO) 5-325 MG tablet Take 1 tablet by mouth every 6 (six) hours as needed for moderate pain.  ? Lancet Devices (ONE TOUCH DELICA LANCING DEV) MISC Please use to check blood  sugar once daily. E11.9  ? Multiple Vitamin (MULTIVITAMIN WITH MINERALS) TABS tablet Take 1 tablet by mouth daily.  ? OneTouch Delica Lancets 97C MISC Please use to check blood sugar once daily. E11.9  ? ONETOUCH VERIO test strip PLEASE USE TO CHECK BLOOD SUGAR ONCE DAILY. E11.9  ? Potassium Chloride ER 20 MEQ TBCR Take 1 tablet by mouth as needed (When taking Lasix).  ? rosuvastatin (CRESTOR) 20 MG tablet Take 1 tablet (20 mg total) by mouth daily.  ? spironolactone (ALDACTONE) 25 MG tablet Take 0.5 tablets (12.5 mg total) by mouth daily.  ? triamcinolone ointment (KENALOG) 0.5 % Apply 1 application topically 2 (two) times daily.  ? ? ? ?Allergies:   Crab [shellfish allergy], Morphine and related, and Atorvastatin  ? ?Past Medical History:  ?Diagnosis Date  ? Adenomatous polyp 03/23/2020  ? Colonoscopy 2021 notable  for adenomatous polyp. Per GI, no further follow up colonoscopies recommended given patient's age.  ? Allergy   ? Arthritis   ? Atherosclerosis of aorta ([place]) 07/15/2021  ? Candidiasis, mouth 10/19/2017  ? Chronic combined systolic and diastolic CHF (congestive heart failure) ([place]) 10/21/2015  ? Echo 3/16: EF 40%, diffuse HK, mild MR, moderate LAE, mild RAE, PASP 42 mmHg  //  Echo 12/16: Mild LVH, EF 35-40%, diffuse HK, Gr 2 DD, MAC, mild MR, mod LAE, normal RVSF, PASP 49 mmHg // Echo 5/19: EF 15-20, diff HK, Gr 2 DD, trivial AI, MAC, trivial MR, mod LAE, normal RVSF, mild TR, PASP 62 // Limited Echo 8/19: mod LVH, EF 40-45, diff HK, Gr 1 DD, MAC, mild LAE   ? Chronic left shoulder pain 04/10/2013  ? Chronic lung disease   ? Chronic pain   ? Chronic pain syndrome 07/20/2017  ? Confusion 06/08/2021  ? Depression 08/25/2016  ? DJD (degenerative joint disease) 10/12/2012  ? Multiple joint replacements by Dr. Collier [place]ina.   ? Dyshidrotic eczema 12/31/2014  ? Edentulous 07/19/2021  ? Emphysema lung ([place]) 03/02/2021  ? Long smoking history.  Noted on CT Low Dose Chest: 01/2021: IMPRESSION: Chronic interstitial changes raise the question of pulmonary fibrosis. High-resolution chest CT may prove helpful to further evaluate as clinically warranted.Emphysema (ICD10-J43.9) and Aortic Atherosclerosis (ICD10-170.0)  Recommended pulmonology referral but patient declined. Recommend continued discussion and symptom monitor  ? Essential hypertension 07/20/2017  ? GERD (gastroesophageal reflux disease)   ? Gout   ? H/O slipped capital femoral epiphysis (SCFE)   ? Heart failure, left, with LVEF 41-49% ([place]) 10/21/2015  ? transthoracic echocardiogram 06/06/18: EF appears improved to 40-45% with diffuse hypokinesis  A. Echo 3/16: EF 40%, diffuse HK, mild MR, moderate LAE, mild RAE, PASP 42 mmHg  //  B. Echo 12/16: Mild LVH, EF 35-40%, diffuse HK, grade 2 diastolic dysfunction, MAC, mild MR, moderate LAE, normal RVSF, PASP 49 mmHg  ? History of total  right hip replacement 04/02/2019  ? Followed closely with orthopedist Dr. Alvan Dame. Patient is s/p total right hip replacement in 1980's with evidence of bone loss and osteopenia making reconstruction unlikely. Plan for conservative treatment at this time with crutches for ambulation and pain control.  ? Hyperlipidemia   ? Hypertension   ? Hypertensive heart disease with CHF (congestive heart failure) ([place]) 10/12/2012  ? Loss of taste 02/06/2018  ? Mass of breast, left 07/21/2014  ? NICM (nonischemic cardiomyopathy) ([place]) 06/09/2016  ? A. LHC 7/17: Normal coronary arteries, EF 30-35%, LVEDP 14 mmHg  ? Pain in gums 04/18/2018  ? Pre-diabetes   ?  Pulmonary fibrosis ([place]) 03/02/2021  ? CT Low Dose Chest: 01/2021: IMPRESSION: Chronic interstitial changes raise the question of pulmonary fibrosis. High-resolution chest CT may prove helpful to further evaluate as clinically warranted.Emphysema (ICD10-J43.9) and Aortic Atherosclerosis (ICD10-170.0)  Recommended pulmonology referral but patient declined. Recommend continued discussion and symptom monitoring.  ? Pulmonary nodule 1 cm or greater in diameter 03/02/2021  ? CT low dose Chest: 01/2021: 1. Lung-RADS 4A, suspicious. 10 mm posterior left lower lobe pulmonary nodule, possibly scarring. Follow up low-dose chest CT without contrast in 3 months (please use the following order, "CT CHEST LCS NODULE FOLLOW-UP W/O CM") is recommended. Alternatively, PET may be considered when there is a solid component 66m or larger.  Plan: Follow up low dose CT ordered in 3 mon  ? Rib pain on left side 09/17/2020  ? Right hip pain 11/02/2012  ? Tendonitis, Achilles, left 05/24/2017  ? Tobacco abuse 09/29/2014  ? Urge incontinence 07/19/2021  ? Vitamin D deficiency 12/31/2014  ? ? ?Past Surgical History:  ?Procedure Laterality Date  ? CARDIAC CATHETERIZATION  2005  ? normal-[place]  ? CARDIAC CATHETERIZATION N/A 05/11/2016  ? Procedure: Right/Left Heart Cath and Coronary Angiography;  Surgeon: Peter M JMartinique MD;   [facility];  Service: Cardiovascular;  Laterality: N/A;  ? CESAREAN SECTION    ? COLONOSCOPY  1990's ??  ? HIP FUSION Left   ? left   ? JOINT REPLACEMENT Bilateral 1991, 1997  ? [place]

## 2022-01-18 ENCOUNTER — Other Ambulatory Visit: Payer: Self-pay

## 2022-01-18 ENCOUNTER — Ambulatory Visit (INDEPENDENT_AMBULATORY_CARE_PROVIDER_SITE_OTHER): Payer: Medicare Other | Admitting: Internal Medicine

## 2022-01-18 ENCOUNTER — Encounter: Payer: Self-pay | Admitting: Internal Medicine

## 2022-01-18 VITALS — BP 109/70 | HR 60 | Ht 64.0 in | Wt 177.0 lb

## 2022-01-18 DIAGNOSIS — I5043 Acute on chronic combined systolic (congestive) and diastolic (congestive) heart failure: Secondary | ICD-10-CM

## 2022-01-18 DIAGNOSIS — Z79899 Other long term (current) drug therapy: Secondary | ICD-10-CM

## 2022-01-18 DIAGNOSIS — I10 Essential (primary) hypertension: Secondary | ICD-10-CM | POA: Diagnosis not present

## 2022-01-18 DIAGNOSIS — I5042 Chronic combined systolic (congestive) and diastolic (congestive) heart failure: Secondary | ICD-10-CM | POA: Diagnosis not present

## 2022-01-18 NOTE — Patient Instructions (Signed)
Medication Instructions:  ?Your physician recommends that you continue on your current medications as directed. Please refer to the Current Medication list given to you today. ? ?*If you need a refill on your cardiac medications before your next appointment, please call your pharmacy* ? ? ?Lab Work: ?CBC, BMET, TSH, HGBA1C, VIT D  ? ?If you have labs (blood work) drawn today and your tests are completely normal, you will receive your results only by: ?MyChart Message (if you have MyChart) OR ?A paper copy in the mail ?If you have any lab test that is abnormal or we need to change your treatment, we will call you to review the results. ? ? ?Testing/Procedures: ?NONE ? ? ?Follow-Up: ?At [facility], you and your health needs are our priority.  As part of our continuing mission to provide you with exceptional heart care, we have created designated Provider Care Teams.  These Care Teams include your primary Cardiologist (physician) and Advanced Practice Providers (APPs -  Physician Assistants and Nurse Practitioners) who all work together to provide you with the care you need, when you need it. ? ?We recommend signing up for the patient portal called "MyChart".  Sign up information is provided on this After Visit Summary.  MyChart is used to connect with patients for Virtual Visits (Telemedicine).  Patients are able to view lab/test results, encounter notes, upcoming appointments, etc.  Non-urgent messages can be sent to your provider as well.   ?To learn more about what you can do with MyChart, go to [email or web address].   ? ?Your next appointment:   ?7 month(s) ? ?The format for your next appointment:   ?In Person ? ?Provider:   ?Dorris Carnes, MD   ? ? ?Other Instructions ?  ?

## 2022-01-19 ENCOUNTER — Telehealth: Payer: Self-pay

## 2022-01-19 LAB — CBC
Hematocrit: 40.1 % (ref 34.0–46.6)
Hemoglobin: 12.8 g/dL (ref 11.1–15.9)
MCH: 27.9 pg (ref 26.6–33.0)
MCHC: 31.9 g/dL (ref 31.5–35.7)
MCV: 87 fL (ref 79–97)
Platelets: 207 10*3/uL (ref 150–450)
RBC: 4.59 x10E6/uL (ref 3.77–5.28)
RDW: 13.2 % (ref 11.7–15.4)
WBC: 5.3 10*3/uL (ref 3.4–10.8)

## 2022-01-19 LAB — BASIC METABOLIC PANEL
BUN/Creatinine Ratio: 20 (ref 12–28)
BUN: 17 mg/dL (ref 8–27)
CO2: 22 mmol/L (ref 20–29)
Calcium: 9.8 mg/dL (ref 8.7–10.3)
Chloride: 100 mmol/L (ref 96–106)
Creatinine, Ser: 0.87 mg/dL (ref 0.57–1.00)
Glucose: 96 mg/dL (ref 70–99)
Potassium: 4.4 mmol/L (ref 3.5–5.2)
Sodium: 136 mmol/L (ref 134–144)
eGFR: 69 mL/min/{1.73_m2} (ref >59)

## 2022-01-19 LAB — HEMOGLOBIN A1C
Est. average glucose Bld gHb Est-mCnc: 134 mg/dL
Hgb A1c MFr Bld: 6.3 % — ABNORMAL HIGH (ref 4.8–5.6)

## 2022-01-19 LAB — TSH: TSH: 3.09 u[IU]/mL (ref 0.450–4.500)

## 2022-01-19 LAB — VITAMIN D 25 HYDROXY (VIT D DEFICIENCY, FRACTURES): Vit D, 25-Hydroxy: 20 ng/mL — ABNORMAL LOW (ref 30.0–100.0)

## 2022-01-19 MED ORDER — VITAMIN D3 125 MCG (5000 UT) PO CAPS: 5000.0000 [IU] | ORAL_CAPSULE | Freq: Every day | ORAL | 3 refills | Status: DC

## 2022-01-19 NOTE — Telephone Encounter (Signed)
-----   Message from Dorris Carnes V, MD sent at 01/19/2022  2:01 PM EDT ----- ?CBC is normal   ?Electrolytes and kidney function are normal ?Thyroid function is normal  ?Hgb A1 C is mildly increased   Cut back on carbs, sweets   ?Vit D is very low   I would recomm 5000 units daily Vit D3     Follow up in 4 months   ?

## 2022-01-19 NOTE — Telephone Encounter (Signed)
Pt advised her lab results and will keep her May 2023 appt for follow up labs.  ?

## 2022-01-21 ENCOUNTER — Telehealth: Payer: Self-pay

## 2022-01-21 DIAGNOSIS — M6281 Muscle weakness (generalized): Secondary | ICD-10-CM | POA: Diagnosis not present

## 2022-01-21 DIAGNOSIS — M19012 Primary osteoarthritis, left shoulder: Secondary | ICD-10-CM | POA: Diagnosis not present

## 2022-01-21 DIAGNOSIS — G8929 Other chronic pain: Secondary | ICD-10-CM | POA: Diagnosis not present

## 2022-01-21 DIAGNOSIS — R262 Difficulty in walking, not elsewhere classified: Secondary | ICD-10-CM | POA: Diagnosis not present

## 2022-01-21 DIAGNOSIS — G629 Polyneuropathy, unspecified: Secondary | ICD-10-CM | POA: Diagnosis not present

## 2022-01-21 DIAGNOSIS — G3184 Mild cognitive impairment, so stated: Secondary | ICD-10-CM | POA: Diagnosis not present

## 2022-01-21 DIAGNOSIS — Z9181 History of falling: Secondary | ICD-10-CM | POA: Diagnosis not present

## 2022-01-21 DIAGNOSIS — M10071 Idiopathic gout, right ankle and foot: Secondary | ICD-10-CM | POA: Diagnosis not present

## 2022-01-21 DIAGNOSIS — M19011 Primary osteoarthritis, right shoulder: Secondary | ICD-10-CM | POA: Diagnosis not present

## 2022-01-21 NOTE — Telephone Encounter (Signed)
Donna Herrera OT calls nurse line to report (2) separate drug to drug interactions.  ? ?Albuterol and Cavedilol have a class 2 interaction. ? ?Potassium and Spironolactone have a class 2 interaction.  ? ?Donna forward to PCP to make sure she is aware. ?

## 2022-01-25 DIAGNOSIS — G3184 Mild cognitive impairment, so stated: Secondary | ICD-10-CM | POA: Diagnosis not present

## 2022-01-25 DIAGNOSIS — M6281 Muscle weakness (generalized): Secondary | ICD-10-CM | POA: Diagnosis not present

## 2022-01-25 DIAGNOSIS — M10071 Idiopathic gout, right ankle and foot: Secondary | ICD-10-CM | POA: Diagnosis not present

## 2022-01-25 DIAGNOSIS — G8929 Other chronic pain: Secondary | ICD-10-CM | POA: Diagnosis not present

## 2022-01-25 DIAGNOSIS — G629 Polyneuropathy, unspecified: Secondary | ICD-10-CM | POA: Diagnosis not present

## 2022-01-25 DIAGNOSIS — Z9181 History of falling: Secondary | ICD-10-CM | POA: Diagnosis not present

## 2022-01-25 DIAGNOSIS — M19012 Primary osteoarthritis, left shoulder: Secondary | ICD-10-CM | POA: Diagnosis not present

## 2022-01-25 DIAGNOSIS — R262 Difficulty in walking, not elsewhere classified: Secondary | ICD-10-CM | POA: Diagnosis not present

## 2022-01-25 DIAGNOSIS — M19011 Primary osteoarthritis, right shoulder: Secondary | ICD-10-CM | POA: Diagnosis not present

## 2022-02-02 ENCOUNTER — Ambulatory Visit: Payer: Medicare Other | Admitting: Family Medicine

## 2022-02-09 DIAGNOSIS — R262 Difficulty in walking, not elsewhere classified: Secondary | ICD-10-CM | POA: Diagnosis not present

## 2022-02-09 DIAGNOSIS — G3184 Mild cognitive impairment, so stated: Secondary | ICD-10-CM | POA: Diagnosis not present

## 2022-02-09 DIAGNOSIS — G629 Polyneuropathy, unspecified: Secondary | ICD-10-CM | POA: Diagnosis not present

## 2022-02-09 DIAGNOSIS — M6281 Muscle weakness (generalized): Secondary | ICD-10-CM | POA: Diagnosis not present

## 2022-02-09 DIAGNOSIS — M19012 Primary osteoarthritis, left shoulder: Secondary | ICD-10-CM | POA: Diagnosis not present

## 2022-02-09 DIAGNOSIS — M19011 Primary osteoarthritis, right shoulder: Secondary | ICD-10-CM | POA: Diagnosis not present

## 2022-02-09 DIAGNOSIS — M10071 Idiopathic gout, right ankle and foot: Secondary | ICD-10-CM | POA: Diagnosis not present

## 2022-02-09 DIAGNOSIS — G8929 Other chronic pain: Secondary | ICD-10-CM | POA: Diagnosis not present

## 2022-02-09 DIAGNOSIS — Z9181 History of falling: Secondary | ICD-10-CM | POA: Diagnosis not present

## 2022-02-10 ENCOUNTER — Encounter: Payer: Self-pay | Admitting: Family Medicine

## 2022-02-10 ENCOUNTER — Ambulatory Visit (INDEPENDENT_AMBULATORY_CARE_PROVIDER_SITE_OTHER): Payer: Medicare Other | Admitting: Family Medicine

## 2022-02-10 VITALS — BP 136/76 | HR 65 | Ht 64.0 in | Wt 174.6 lb

## 2022-02-10 DIAGNOSIS — R059 Cough, unspecified: Secondary | ICD-10-CM | POA: Diagnosis not present

## 2022-02-10 DIAGNOSIS — M169 Osteoarthritis of hip, unspecified: Secondary | ICD-10-CM | POA: Diagnosis not present

## 2022-02-10 DIAGNOSIS — R5383 Other fatigue: Secondary | ICD-10-CM | POA: Diagnosis not present

## 2022-02-10 DIAGNOSIS — M25551 Pain in right hip: Secondary | ICD-10-CM

## 2022-02-10 MED ORDER — HYDROCODONE-ACETAMINOPHEN 5-325 MG PO TABS: 1.0000 | ORAL_TABLET | Freq: Four times a day (QID) | ORAL | 0 refills | Status: DC | PRN

## 2022-02-10 MED ORDER — DULOXETINE HCL 30 MG PO CPEP: 30.0000 mg | ORAL_CAPSULE | Freq: Every day | ORAL | 0 refills | Status: DC

## 2022-02-10 MED ORDER — FLUTICASONE PROPIONATE 50 MCG/ACT NA SUSP: 2.0000 | Freq: Every day | NASAL | 6 refills | Status: DC

## 2022-02-10 NOTE — Patient Instructions (Signed)
It was great seeing you today! ? ?You came in for increased pain and decreased energy which is likely in part due to your arthritis and neuropathy. I am adding Cymbalta which will help with mood and pain.  We will start with a low-dose of 30 mg a day, and once I follow-up with you in about 2 to 3 weeks we can increase it if you are tolerating it okay. ? ?I have also prescribed Flonase to use 2 sprays in each nostril daily to help with her allergies, and also potentially with the coughing.  We can check at follow-up about the need of starting you on a daily inhaler. ? ?Please check-out at the front desk before leaving the clinic. I'd like to see you back in 2-3 weeks, but if you need to be seen earlier than that for any new issues we're happy to fit you in, just give [name] a call! ? ?Visit Reminders: ?- Stop by the pharmacy to pick up your prescriptions  ?- Continue to work on your healthy eating habits and incorporating exercise into your daily life.  ? ? ?Feel free to call with any questions or concerns at any time, at [phone]. ?  ?Take care,  ?Dr. [name] ?[place]  ?

## 2022-02-10 NOTE — Progress Notes (Signed)
? ? ?  SUBJECTIVE:  ? ?CHIEF COMPLAINT / HPI:  ? ?Patient presents for check up.  ? ?Feels like she doesn't have energy, just wants to lay down. Does endorse getting good sleep. Has been eating and drinking ok. States she drinks a lot of water and feels dry all the time. States she typically will garden but doesn't have the energy todo that. Physical therapy came to the house yesterday and she said that will be the last time because it wasn't helpful.  ? ?She also endorses bilateral foot pain that hurts on the medial aspect of her feet. Pain worse with walking. ? ?Had COVID about a month and a half ago and still with continued coughing. Also endorses allergy symptoms, not taking anything for it.  ? ?Requesting home health aid to help with bathing and other tasks.  ? ?Still endorses neuroatphy does not feel the Gabapentin helps. Is not interested in increasing the dose but is open to taking something else.  ? ? ?OBJECTIVE:  ? ?BP 136/76   Pulse 65   Wt 174 lb 9.6 oz (79.2 kg)   SpO2 100%   BMI 29.97 kg/m?   ? ?General: pleasant, NAD ?CV: RRR no murmurs  ?Resp: CTAB normal WOB  ?GI: soft, non distended  ?Derm: warm, dry. Minimal LE edema  ? ?ASSESSMENT/PLAN:  ? ?No problem-specific Assessment & Plan notes found for this encounter. ?  ?Low energy ?Patient feels chronically that she is tired, just wants to lay in bed. She endorses getting good sleep at night. She was recently seen by her cardiologist on 3/21 for CHF follow up and had labs drawn at that time. CBC normal ruling out anemia as a cause. TSH normal ruling out thyroid etiology. Electrolytes and kidney function normal. She endorses getting good sleep at night. Her last echo was almost 4 years ago with EF of 40-45% she would potentially benefit from repeat echo. Unclear cause of her low energy, but workup has been reassuring. Likely multifactorial due to her chronic pain from arthritis and neuropathy, medication side effects, and mood. Opted to start Cymbalta  to help with mood and her neuropathy that she feels is not improved with Gabapentin 300 at night. She declines wanting to increase Gabapentin dose. Will start on low dose '30mg'$  and follow up in 2 weeks and can increase dose if tolerating it well.  ? ?Cough ?Patient endorses cough since having COVID a month and a half ago. Also states she has seasonal allergies, currently is not taking anything for it. Prescribed Flonase daily. Will discuss more at follow up, patient may also benefit from pulmonary function testing and maintenance inhaler ? ?Misc ?Patient requesting referral for home health aid to help with ADLs. Unsure if insurance will cover as she currently is able to perform them. Will reach out to chronic care management who currently works with patient for assistance  ? ? ?[name], DO ?[place]   ?

## 2022-02-14 ENCOUNTER — Ambulatory Visit: Payer: Medicare Other

## 2022-02-14 NOTE — Chronic Care Management (AMB) (Signed)
?Chronic Care Management  ? ?CCM RN Visit Note ? ?02/14/2022 ?Name: Donna Herrera MRN: [id number] DOB: 1946-07-30 ? ?Subjective: ?Donna Herrera is a 76 y.o. year old female who is a primary care patient of [name], DO. The care management team was consulted for assistance with disease management and care coordination needs.   ? ?Engaged with patient by telephone for follow up visit in response to provider referral for case management and/or care coordination services.  ? ?Consent to Services:  ?The patient was given information about Chronic Care Management services, agreed to services, and gave verbal consent prior to initiation of services.  Please see initial visit note for detailed documentation.  ? ?Patient agreed to services and verbal consent obtained.  ? ? ?Summary: Patient is making progress with chronic condition , but currently is experiencing difficulty with fatigue . See Care Plan below for interventions and patient self-care actives. ? ?Recommendation: The patient may benefit from taking medications as prescribed, exercising as tolerated, and The patient agrees. ? ?Follow up Plan: Patient would like continued follow-up.  CCM RNCM will outreach the patient within the next 5 weeks.  Patient will call office if needed prior to next encounter ? ? ?Assessment: Review of patient past medical history, allergies, medications, health status, including review of consultants reports, laboratory and other test data, was performed as part of comprehensive evaluation and provision of chronic care management services.  ? ?SDOH (Social Determinants of Health) assessments and interventions performed: No  ? ?CCM Care Plan ? ? ? ? ?Conditions to be addressed/monitored:COPD and Pain ? ?Care Plan : RN Case Manager  ?Updates made by Donna Arms, RN since 02/14/2022 12:00 AM  ?  ? ?Problem: COPD and Chronic Pain   ?  ? ?Long-Range Goal: The patient will monitor and manage her COPD and Chronic  pain   ?Start Date: 07/20/2021  ?Expected End Date: 03/30/2022  ?Priority: High  ?Note:   ?Current Barriers:  ?Knowledge Deficits related to plan of care for management of COPD and Chronic Pain  ? ?RNCM Clinical Goal(s):  ?Patient will verbalize understanding of plan for management of COPD and Chronic Pain and work to manage and prevent exacerbation of symptoms  through collaboration with RN Care manager, provider, and care team.  ? ?Interventions: ?1:1 collaboration with primary care provider regarding development and update of comprehensive plan of care as evidenced by provider attestation and co-signature ?Inter-disciplinary care team collaboration (see longitudinal plan of care) ?Evaluation of current treatment plan related to  self management and patient's adherence to plan as established by provider ? ? ?COPD: (Status: Goal on Track (progressing): YES.) Long term ?Advised patient to track and manage COPD triggers;  ?Advised patient to self assesses COPD action plan zone and make appointment with provider if in the yellow zone for 48 hours without improvement; ?Provided education about and advised patient to utilize infection prevention strategies to reduce risk of respiratory infection; ?The patient was provided contact information for question or concerns ? ?Pain:  (Status: Goal on Track (progressing): YES.) Long term ?Pain assessment performed- 010 ?Reviewed provider established plan for pain management; ?Discussed importance of adherence to all scheduled medical appointments; ?Counseled on the importance of reporting any/all new or changed pain symptoms or management strategies to pain management provider; ?Discussed use of relaxation techniques and/or diversional activities to assist with pain reduction (distraction, imagery, relaxation, massage, acupressure, TENS, heat, and cold application; ?Reviewed with patient prescribed pharmacological and nonpharmacological pain relief strategies; ?02/14/22: The  patient  said she was not feeling any pain right now in her back or hip.  She finished physical therapy for her shoulder, which helped some but not a lot.  She received her inhaler, which helped her breathing, and she has not had to use it much at all.  She is still feeling sluggish, but not sure why.  She has a follow-up appointment with her PCP on 03/02/22. ? ?Patient Goals/Self Care Activities: ?-Patient/Caregiver will self-administer medications as prescribed as evidenced by self-report/primary caregiver report , ?Patient/Caregiver will attend all scheduled provider appointments as evidenced by clinician review of documented attendance to scheduled appointments and patient/caregiver report, ?-Patient/Caregiver will call pharmacy for medication refills as evidenced by patient report and review of pharmacy fill history as appropriate, ? -Patient/Caregiver will call provider office for new concerns or questions as evidenced by review of documented incoming telephone call notes and patient report,  ?-Patient/Caregiver verbalizes understanding of plan,  ? -Patient/Caregiver will focus on medication adherence by taking medications as prescribed ?-Avoid smoke and air pollution ?-Keep your airway clear from mucus build up  ?-Will call pharmacy for medication refills 7 days prior to needed refill date ?-Patient will use exercises given by physical therapy ? ? ? ?  ? ?Donna Arms RN, BSN, [place] ?Care Management Coordinator ?[place]  ?Phone: [phone]  ?  ? ? ? ? ? ? ? ? ?

## 2022-02-14 NOTE — Patient Instructions (Signed)
Visit Information ? ?[name]  it was nice speaking with you. Please call me directly [phone] if you have questions about the goals we discussed. ? ?  ?Patient Goals/Self Care Activities: ?-Patient/Caregiver will self-administer medications as prescribed as evidenced by self-report/primary caregiver report , ?Patient/Caregiver will attend all scheduled provider appointments as evidenced by clinician review of documented attendance to scheduled appointments and patient/caregiver report, ?-Patient/Caregiver will call pharmacy for medication refills as evidenced by patient report and review of pharmacy fill history as appropriate, ? -Patient/Caregiver will call provider office for new concerns or questions as evidenced by review of documented incoming telephone call notes and patient report,  ?-Patient/Caregiver verbalizes understanding of plan,  ? -Patient/Caregiver will focus on medication adherence by taking medications as prescribed ?-Avoid smoke and air pollution ?-Keep your airway clear from mucus build up  ?-Will call pharmacy for medication refills 7 days prior to needed refill date ?-Patient will use exercises given by physical therapy ?  ? ? ?The patient verbalized understanding of instructions, educational materials, and care plan provided today and agreed to receive a mailed copy of patient instructions, educational materials, and care plan.  ? ?Follow up Plan: Patient would like continued follow-up.  CCM RNCM will outreach the patient within the next 5 weeks.  Patient will call office if needed prior to next encounter ? ?Lazaro Arms, RN ? ?[phone]  ?

## 2022-02-19 ENCOUNTER — Other Ambulatory Visit: Payer: Self-pay | Admitting: Family Medicine

## 2022-03-02 ENCOUNTER — Encounter: Payer: Self-pay | Admitting: Family Medicine

## 2022-03-02 ENCOUNTER — Ambulatory Visit (INDEPENDENT_AMBULATORY_CARE_PROVIDER_SITE_OTHER): Payer: Medicare Other | Admitting: Family Medicine

## 2022-03-02 ENCOUNTER — Other Ambulatory Visit: Payer: Self-pay

## 2022-03-02 VITALS — BP 113/69 | HR 72 | Wt 176.8 lb

## 2022-03-02 DIAGNOSIS — M79671 Pain in right foot: Secondary | ICD-10-CM

## 2022-03-02 DIAGNOSIS — R5383 Other fatigue: Secondary | ICD-10-CM

## 2022-03-02 DIAGNOSIS — M158 Other polyosteoarthritis: Secondary | ICD-10-CM

## 2022-03-02 DIAGNOSIS — M79672 Pain in left foot: Secondary | ICD-10-CM | POA: Diagnosis not present

## 2022-03-02 NOTE — Patient Instructions (Addendum)
It was great seeing you today! ? ?We talked about your joint pain, and you can use the Tylenol arthritis daily and twice a day if needed to help with your pain.  Use the hydrocodone if the Tylenol is not helpful.  It will also be very important to continue your home exercises daily to help with improving her mobility.  As we discussed you can also consider steroid injections in the future for your shoulder pain as a temporary pain relief. ? ?We also discussed taking the rosuvastatin every other day to also help reduce your joint pain. ? ?AS far as increasing energy throughout the day it can be helpful to stick to a certain bedtime, and avoiding bright screens within an hour before you go to sleep.  During the day you can open up the curtains and the light will help increase your energy as well.  Getting moving during the day with your exercises and walking around will be helpful as well. ? ?Using your walker instead of the crutches could help with some of that nerve pain in your arms as well. ? ?I will contact the chronic care team about getting you a home aide if eligible for one.  ? ?I have also placed a referral for podiatry to help getting you diabetic shoes.  They will call you to schedule an appointment in the next couple of weeks, but if you do not hear back please call to the clinic. ? ?Feel free to call with any questions or concerns at any time, at [phone]. ?  ?Take care,  ?Dr. [name] ?[place] ?

## 2022-03-02 NOTE — Progress Notes (Signed)
? ? ?SUBJECTIVE:  ? ?CHIEF COMPLAINT / HPI:  ? ?[name] is a 76 yo who presents to [facility] clinic with her grand daughter.  ? ?Patient lives with her daughter and daughter's husband. She manages medication on her own though daughter says she probably needs to start helping her. She brought all of her medications to the visit today and we were able to go through each of them one by one.  ? ?She endorses feeling low energy, and does want to feel better and feel more motivated to do things she used to do like gardening, playing with her grandchildren and great-grandchildren.  States she used to cook every day, and just does not feel like cooking anymore.  She does state that she watches TV until really late, and then during the day does have her blackout curtains.  She denies moving around much because she states that she just wants to stay in bed.  This states that she did have bad depression during COVID, but feels she is getting out of it. She was started on Cymbalta at last visit, and does feel it may be helping with her mood a bit.  She stopped taking the gabapentin because it was making her drowsy. ? ?She endorses chronic right hip pain which she has had replaced.  Also with chronic knee pain and has also had 2 knee replacements.  She also endorses left shoulder pain, bilateral foot pain, and bilateral hand numbness and tingling.  She was seen by orthopedic who stated that they would not operate on her at this point.  She has had steroid injections in the past, and states she does not want anymore.  She states she is done with physical therapy and would rather not do any more of it.  Does have home exercises at home that she is not doing on a daily basis, but states she will do more of them.  Granddaughter states that family can help do exercises with her.  For her pain she is taking Norco about every other day and Tylenol every other day.  ? ?Patient ambulates with crutches, and recommended using a  walker which can also help with her nerve pain in her upper extremities. ? ?Takes baclofen for cramping once a day depending on when she has cramping. Usually at night  ? ? ?OBJECTIVE:  ? ?BP 113/69   Pulse 72   Wt 176 lb 12.8 oz (80.2 kg)   SpO2 99%   BMI 30.35 kg/m?   ? ?Physical exam ?General: well appearing, NAD ?Cardiovascular: RRR, no murmurs ?Lungs: CTAB. Normal WOB ?Abdomen: soft, non-distended, non-tender ?Skin: warm, dry. No edema ? ?ASSESSMENT/PLAN:  ? ?Osteoarthritis ?Patient with chronic right hip pain, bilateral knee pain, bilateral foot pain, left shoulder pain likely due to arthritis.  Has been seen by Ortho who told her that she is not a candidate for surgery.  Discussed continuing medical management.  She has not been using Tylenol much stating maybe 1 pill every other day.  Discussed maximizing Tylenol to help with pain relief, and that she can use Tylenol arthritis a couple times a day if she needs it.  And then take the hydrocodone for breakthrough pain if not relieved with the Tylenol. Also recommended taking rosuvastatin every other day instead of daily as she feels this might be contributing to some of her pain. Discussed Importance of exercising at home since she is done with her physical therapy due to insurance coverage granddaughter stated that family will  help with her exercises as well. ? ?Low energy ?Patient endorses low energy. Likely related to staying in bed watching tv with black out curtains throughout the day as well as mood. Started on Cymbalta a couple of weeks ago which she does feel is helping some with mood. Tolerating well without side effects. Blood work has been unremarkable. Recommended sticking to a certain bedtime, and avoiding bright screens within an hour before  sleep.  During the day opening the curtains to get light in to help regulate sleep wake cycle. Recommended getting moving during the day with exercises and walking around. ?  ?Misc  ?Patient ambulates  with crutches, and recommended using a walker which can also help with her nerve pain in her upper extremities. Does have a standing walker at home and does not need another one orderd  ?- can discuss [place] [facility] at follow up  ?- podiatry referral for diabetic shoes  ? ? ?Donna Key, DO ?[place]   ?

## 2022-03-05 DIAGNOSIS — R5383 Other fatigue: Secondary | ICD-10-CM | POA: Insufficient documentation

## 2022-03-05 NOTE — Assessment & Plan Note (Addendum)
Patient endorses low energy. Likely related to staying in bed watching tv with black out curtains throughout the day as well as mood. Started on Cymbalta a couple of weeks ago which she does feel is helping some with mood. Tolerating well without side effects. Blood work has been unremarkable. Recommended sticking to a certain bedtime, and avoiding bright screens within an hour before  sleep.  During the day opening the curtains to get light in to help regulate sleep wake cycle. Recommended getting moving during the day with exercises and walking around. ?

## 2022-03-05 NOTE — Assessment & Plan Note (Signed)
Patient with chronic right hip pain, bilateral knee pain, bilateral foot pain, left shoulder pain likely due to arthritis.  Has been seen by Ortho who told her that she is not a candidate for surgery.  Discussed continuing medical management.  She has not been using Tylenol much stating maybe 1 pill every other day.  Discussed maximizing Tylenol to help with pain relief, and that she can use Tylenol arthritis a couple times a day if she needs it.  And then take the hydrocodone for breakthrough pain if not relieved with the Tylenol. Also recommended taking rosuvastatin every other day instead of daily as she feels this might be contributing to some of her pain. Discussed Importance of exercising at home since she is done with her physical therapy due to insurance coverage granddaughter stated that family will help with her exercises as well. ?

## 2022-03-07 ENCOUNTER — Ambulatory Visit: Payer: Medicare Other

## 2022-03-07 ENCOUNTER — Other Ambulatory Visit: Payer: Self-pay | Admitting: Family Medicine

## 2022-03-07 NOTE — Chronic Care Management (AMB) (Signed)
?Chronic Care Management  ? ?CCM RN Visit Note ? ?03/07/2022 ?Name: Donna Herrera MRN: [id number] DOB: May 01, 1946 ? ?Subjective: ?Donna Herrera is a 76 y.o. year old female who is a primary care patient of [name], DO. The care management team was consulted for assistance with disease management and care coordination needs.   ? ?Engaged with patient by telephone for follow up visit in response to provider referral for case management and/or care coordination services.  ? ?Consent to Services:  ?The patient was given information about Chronic Care Management services, agreed to services, and gave verbal consent prior to initiation of services.  Please see initial visit note for detailed documentation.  ? ?Patient agreed to services and verbal consent obtained.  ? ? ?Summary: I informed her that her primary care physician had submitted the referral for her diabetic shoes, but unfortunately, she is not eligible for a [employer] worker without Medicaid insurance unless she is willing to pay for it.  ?. See Care Plan below for interventions and patient self-care actives. ? ?Recommendation: The patient may benefit from taking medications as prescribed, monitoring food intake, and The patient agrees. ? ?Follow up Plan: Patient would like continued follow-up.  CCM RNCM will outreach the patient within the next 5 weeks  Patient will call office if needed prior to next encounter ? ? ?Assessment: Review of patient past medical history, allergies, medications, health status, including review of consultants reports, laboratory and other test data, was performed as part of comprehensive evaluation and provision of chronic care management services.  ? ?SDOH (Social Determinants of Health) assessments and interventions performed:  No ? ? ? ? ?Conditions to be addressed/monitored:COPD and Pain ? ?Care Plan : RN Case Manager  ?Updates made by Lazaro Arms, RN since 03/07/2022 12:00 AM  ?  ? ?Problem: COPD and  Chronic Pain   ?  ? ?Long-Range Goal: The patient will monitor and manage her COPD and Chronic pain   ?Start Date: 07/20/2021  ?Expected End Date: 03/30/2022  ?Priority: High  ?Note:   ?Current Barriers:  ?Knowledge Deficits related to plan of care for management of COPD and Chronic Pain  ? ?RNCM Clinical Goal(s):  ?Patient will verbalize understanding of plan for management of COPD and Chronic Pain and work to manage and prevent exacerbation of symptoms  through collaboration with RN Care manager, provider, and care team.  ? ?Interventions: ?1:1 collaboration with primary care provider regarding development and update of comprehensive plan of care as evidenced by provider attestation and co-signature ?Inter-disciplinary care team collaboration (see longitudinal plan of care) ?Evaluation of current treatment plan related to  self management and patient's adherence to plan as established by provider ? ? ?COPD: (Status: Goal on Track (progressing): YES.) Long term ?Advised patient to track and manage COPD triggers;  ?Advised patient to self assesses COPD action plan zone and make appointment with provider if in the yellow zone for 48 hours without improvement; ?Provided education about and advised patient to utilize infection prevention strategies to reduce risk of respiratory infection; ?The patient was provided contact information for question or concerns ? ?Pain:  (Status: Goal on Track (progressing): YES.) Long term ?Pain assessment performed- 010 ?Reviewed provider established plan for pain management; ?Discussed importance of adherence to all scheduled medical appointments; ?Counseled on the importance of reporting any/all new or changed pain symptoms or management strategies to pain management provider; ?Discussed use of relaxation techniques and/or diversional activities to assist with pain reduction (distraction, imagery, relaxation, massage, acupressure,  TENS, heat, and cold application; ?Reviewed with patient  prescribed pharmacological and nonpharmacological pain relief strategies; ?5/8//23: I spoke with Donna Herrera; she is doing fine without complaints of pain or breathing difficulties this week. Although physical therapy for her shoulder has ended, she still experiences some pain, but she is following the exercises given her. She didn't have to use her inhaler for the whole week. ? ? ? ?Patient Goals/Self Care Activities: ?-Patient/Caregiver will self-administer medications as prescribed as evidenced by self-report/primary caregiver report , ?Patient/Caregiver will attend all scheduled provider appointments as evidenced by clinician review of documented attendance to scheduled appointments and patient/caregiver report, ?-Patient/Caregiver will call pharmacy for medication refills as evidenced by patient report and review of pharmacy fill history as appropriate, ? -Patient/Caregiver will call provider office for new concerns or questions as evidenced by review of documented incoming telephone call notes and patient report,  ?-Patient/Caregiver verbalizes understanding of plan,  ? -Patient/Caregiver will focus on medication adherence by taking medications as prescribed ?-Avoid smoke and air pollution ?-Keep your airway clear from mucus build up  ?-Will call pharmacy for medication refills 7 days prior to needed refill date ?-Patient will use exercises given by physical therapy ? ? ? ?  ? ?Lazaro Arms RN, BSN, [place] ?Care Management Coordinator ?[place]  ?Phone: [phone]  ?  ? ? ? ? ? ? ? ? ?

## 2022-03-07 NOTE — Patient Instructions (Signed)
Visit Information ? ?[name]  it was nice speaking with you. Please call me directly [phone] if you have questions about the goals we discussed. ? ?Patient Goals/Self Care Activities: ?-Patient/Caregiver will self-administer medications as prescribed as evidenced by self-report/primary caregiver report , ?Patient/Caregiver will attend all scheduled provider appointments as evidenced by clinician review of documented attendance to scheduled appointments and patient/caregiver report, ?-Patient/Caregiver will call pharmacy for medication refills as evidenced by patient report and review of pharmacy fill history as appropriate, ? -Patient/Caregiver will call provider office for new concerns or questions as evidenced by review of documented incoming telephone call notes and patient report,  ?-Patient/Caregiver verbalizes understanding of plan,  ? -Patient/Caregiver will focus on medication adherence by taking medications as prescribed ?-Avoid smoke and air pollution ?-Keep your airway clear from mucus build up  ?-Will call pharmacy for medication refills 7 days prior to needed refill date ?-Patient will use exercises given by physical therapy ?  ?  ? ? ?The patient verbalized understanding of instructions, educational materials, and care plan provided today and agreed to receive a mailed copy of patient instructions, educational materials, and care plan.  ? ?Follow up Plan: Patient would like continued follow-up.  CCM RNCM will outreach the patient within the next 5 weeks  Patient will call office if needed prior to next encounter ? ?Lazaro Arms, RN ? ?[phone]  ?

## 2022-03-15 ENCOUNTER — Other Ambulatory Visit: Payer: Self-pay

## 2022-03-15 DIAGNOSIS — M169 Osteoarthritis of hip, unspecified: Secondary | ICD-10-CM

## 2022-03-16 MED ORDER — HYDROCODONE-ACETAMINOPHEN 5-325 MG PO TABS: 1.0000 | ORAL_TABLET | Freq: Four times a day (QID) | ORAL | 0 refills | Status: DC | PRN

## 2022-03-29 NOTE — Progress Notes (Unsigned)
Cardiology Office Note   Date:  03/31/2022   ID:  Donna Herrera, DOB [birth year], MRN [id number]  PCP:  [name], DO  Cardiologist:   Dorris Carnes, MD   F/U of CHF     History of Present Illness: Donna Herrera is a 76 y.o. female with a history of systolic CHF   LVEF in 15 to 20% (Cath in 2017 normal)   Repeat in 2019 LVEF 40 to 49  Pt also has a hx of HTN, HL and tob use  I saw the pt in March 2023 Since seen has been started on Celexa   Thinks it is helping some  Pt says she will get an occasional cramp in chest     Comes and goes   Not associated iwht activity   Not ppleuritic  Episodes last seconds   She does get around with crutches  Denies injury     The pt says she stays up until about 5 AM most times   Watches TV   Works on computer   Sleeps until 2 or 3  PM  Diet:   Pt eats a lot of gummy bears throughout day    Current Meds  Medication Sig   acetaminophen (TYLENOL) 500 MG tablet Take 1,000 mg by mouth daily as needed for mild pain or moderate pain.   albuterol (VENTOLIN HFA) 108 (90 Base) MCG/ACT inhaler Inhale 1-2 puffs into the lungs every 6 (six) hours as needed for wheezing.   allopurinol (ZYLOPRIM) 100 MG tablet Take 1 tablet (100 mg total) by mouth daily.   aspirin 81 MG tablet Take 1 tablet (81 mg total) by mouth daily.   atorvastatin (LIPITOR) 40 MG tablet Take 40 mg by mouth daily.   baclofen (LIORESAL) 10 MG tablet TAKE 1 TABLET (10 MG TOTAL) BY MOUTH 3 (THREE) TIMES DAILY. WILL REFILL FOR 2 MONTH SUPPLY, PLEASE SCHEDULE APPOINTMENT WITH PCP FOR FOLLOW-UP FOR FUTURE REFILLS.   benzonatate (TESSALON) 100 MG capsule as needed.   Blood Glucose Monitoring Suppl (ONETOUCH VERIO) w/Device KIT Please use to check blood sugar once daily. E11.9   calcium carbonate (TUMS - DOSED IN MG ELEMENTAL CALCIUM) 500 MG chewable tablet Chew 1 tablet by mouth daily as needed for indigestion or heartburn.   carvedilol (COREG) 3.125 MG tablet TAKE 1 TABLET  BY MOUTH 2 TIMES DAILY.   cetirizine (ZYRTEC) 10 MG tablet Take 10 mg by mouth daily as needed for allergies.   chlorhexidine gluconate, MEDLINE KIT, (PERIDEX) 0.12 % solution Use as directed 15 mLs in the mouth or throat 2 (two) times daily.   Cholecalciferol (VITAMIN D3) 125 MCG (5000 UT) CAPS Take 1 capsule (5,000 Units total) by mouth daily.   colchicine 0.6 MG tablet Take 1 tablet (0.6 mg total) by mouth daily.   diclofenac Sodium (VOLTAREN) 1 % GEL Apply 4 g topically 4 (four) times daily.   DULoxetine (CYMBALTA) 30 MG capsule TAKE 1 CAPSULE(30 MG) BY MOUTH DAILY   famotidine (PEPCID) 20 MG tablet TAKE 1 TABLET (20 MG TOTAL) BY MOUTH DAILY AS NEEDED FOR HEARTBURN OR INDIGESTION.   fluticasone (FLONASE) 50 MCG/ACT nasal spray Place 2 sprays into both nostrils daily as needed for allergies or rhinitis.   fluticasone (FLONASE) 50 MCG/ACT nasal spray Place 2 sprays into both nostrils daily.   furosemide (LASIX) 20 MG tablet TAKE 20 MG TABLET BY MOUTH ON Monday, WED, FRI   gabapentin (NEURONTIN) 300 MG capsule TAKE 1 CAPSULE BY MOUTH EVERYDAY AT  BEDTIME   hydrALAZINE (APRESOLINE) 50 MG tablet TAKE 1 TABLET BY MOUTH THREE TIMES A DAY   HYDROcodone-acetaminophen (NORCO) 5-325 MG tablet Take 1 tablet by mouth every 6 (six) hours as needed for moderate pain.   Lancet Devices (ONE TOUCH DELICA LANCING DEV) MISC Please use to check blood sugar once daily. E11.9   Multiple Vitamin (MULTIVITAMIN WITH MINERALS) TABS tablet Take 1 tablet by mouth daily.   OneTouch Delica Lancets 24M MISC Please use to check blood sugar once daily. E11.9   ONETOUCH VERIO test strip PLEASE USE TO CHECK BLOOD SUGAR ONCE DAILY. E11.9   Potassium Chloride ER 20 MEQ TBCR Take 1 tablet by mouth as needed (When taking Lasix).   rosuvastatin (CRESTOR) 20 MG tablet Take 1 tablet (20 mg total) by mouth daily.   spironolactone (ALDACTONE) 25 MG tablet Take 0.5 tablets (12.5 mg total) by mouth daily.   triamcinolone ointment  (KENALOG) 0.5 % Apply 1 application topically 2 (two) times daily.     Allergies:   Crab [shellfish allergy], Morphine and related, and Atorvastatin   Past Medical History:  Diagnosis Date   Adenomatous polyp 03/23/2020   Colonoscopy 2021 notable for adenomatous polyp. Per GI, no further follow up colonoscopies recommended given patient's age.   Allergy    Arthritis    Atherosclerosis of aorta ([place]) 07/15/2021   Candidiasis, mouth 10/19/2017   Chronic combined systolic and diastolic CHF (congestive heart failure) ([place]) 10/21/2015   Echo 3/16: EF 40%, diffuse HK, mild MR, moderate LAE, mild RAE, PASP 42 mmHg  //  Echo 12/16: Mild LVH, EF 35-40%, diffuse HK, Gr 2 DD, MAC, mild MR, mod LAE, normal RVSF, PASP 49 mmHg // Echo 5/19: EF 15-20, diff HK, Gr 2 DD, trivial AI, MAC, trivial MR, mod LAE, normal RVSF, mild TR, PASP 62 // Limited Echo 8/19: mod LVH, EF 40-45, diff HK, Gr 1 DD, MAC, mild LAE    Chronic left shoulder pain 04/10/2013   Chronic lung disease    Chronic pain    Chronic pain syndrome 07/20/2017   Confusion 06/08/2021   Depression 08/25/2016   DJD (degenerative joint disease) 10/12/2012   Multiple joint replacements by Dr. Collier Salina.    Dyshidrotic eczema 12/31/2014   Edentulous 07/19/2021   Emphysema lung ([place]) 03/02/2021   Long smoking history.  Noted on CT Low Dose Chest: 01/2021: IMPRESSION: Chronic interstitial changes raise the question of pulmonary fibrosis. High-resolution chest CT may prove helpful to further evaluate as clinically warranted.Emphysema (ICD10-J43.9) and Aortic Atherosclerosis (ICD10-170.0)  Recommended pulmonology referral but patient declined. Recommend continued discussion and symptom monitor   Essential hypertension 07/20/2017   GERD (gastroesophageal reflux disease)    Gout    H/O slipped capital femoral epiphysis (SCFE)    Heart failure, left, with LVEF 41-49% ([place]) 10/21/2015   transthoracic echocardiogram 06/06/18: EF appears improved to 40-45% with  diffuse hypokinesis  A. Echo 3/16: EF 40%, diffuse HK, mild MR, moderate LAE, mild RAE, PASP 42 mmHg  //  B. Echo 12/16: Mild LVH, EF 35-40%, diffuse HK, grade 2 diastolic dysfunction, MAC, mild MR, moderate LAE, normal RVSF, PASP 49 mmHg   History of total right hip replacement 04/02/2019   Followed closely with orthopedist Dr. Alvan Dame. Patient is s/p total right hip replacement in 1980's with evidence of bone loss and osteopenia making reconstruction unlikely. Plan for conservative treatment at this time with crutches for ambulation and pain control.   Hyperlipidemia    Hypertension    Hypertensive heart  disease with CHF (congestive heart failure) ([place]) 10/12/2012   Loss of taste 02/06/2018   Mass of breast, left 07/21/2014   NICM (nonischemic cardiomyopathy) ([place]) 06/09/2016   A. LHC 7/17: Normal coronary arteries, EF 30-35%, LVEDP 14 mmHg   Pain in gums 04/18/2018   Pre-diabetes    Pulmonary fibrosis ([place]) 03/02/2021   CT Low Dose Chest: 01/2021: IMPRESSION: Chronic interstitial changes raise the question of pulmonary fibrosis. High-resolution chest CT may prove helpful to further evaluate as clinically warranted.Emphysema (ICD10-J43.9) and Aortic Atherosclerosis (ICD10-170.0)  Recommended pulmonology referral but patient declined. Recommend continued discussion and symptom monitoring.   Pulmonary nodule 1 cm or greater in diameter 03/02/2021   CT low dose Chest: 01/2021: 1. Lung-RADS 4A, suspicious. 10 mm posterior left lower lobe pulmonary nodule, possibly scarring. Follow up low-dose chest CT without contrast in 3 months (please use the following order, "CT CHEST LCS NODULE FOLLOW-UP W/O CM") is recommended. Alternatively, PET may be considered when there is a solid component 48m or larger.  Plan: Follow up low dose CT ordered in 3 mon   Rib pain on left side 09/17/2020   Right hip pain 11/02/2012   Tendonitis, Achilles, left 05/24/2017   Tobacco abuse 09/29/2014   Urge incontinence 07/19/2021   Vitamin D  deficiency 12/31/2014    Past Surgical History:  Procedure Laterality Date   CARDIAC CATHETERIZATION  2005   normal-[place]   CARDIAC CATHETERIZATION N/A 05/11/2016   Procedure: Right/Left Heart Cath and Coronary Angiography;  Surgeon: Peter M JMartinique MD;  [facility];  Service: Cardiovascular;  Laterality: N/A;   CESAREAN SECTION     COLONOSCOPY  1990's ??   HIP FUSION Left    left    JOINT REPLACEMENT Bilateral 1991, 1997   [place]   ROTATOR CUFF REPAIR     bilateral   TOOTH EXTRACTION N/A 04/03/2020   Procedure: DENTAL RESTORATION/EXTRACTIONS;  Surgeon: JDiona Browner DDS;  [facility]  Service: Oral Surgery;  Laterality: N/A;   TOTAL HIP ARTHROPLASTY  1987   right. Dr. ACollier Salina  TUBAL LIGATION       Social History:  The patient  reports that she quit smoking about 6 years ago. Her smoking use included cigarettes. She has a 27.50 pack-year smoking history. She has never used smokeless tobacco. She reports that she does not currently use alcohol. She reports that she does not use drugs.   Family History:  The patient's family history includes Cancer in her father; Depression in her mother; Diabetes in her mother and sister; Heart disease in her mother; Hypertension in her mother; Stomach cancer in her brother; Stroke in her mother; Throat cancer in her son.    ROS:  Please see the history of present illness. All other systems are reviewed and  Negative to the above problem except as noted.    PHYSICAL EXAM: VS:  BP (!) 142/84   Pulse 63   Ht _0  (1.626 m)   Wt 173 lb 3.2 oz (78.6 kg)   SpO2 95%   BMI 29.73 kg/m   GEN: Overweight  76yo in no acute distress  HEENT: normal  Neck: no JVD   Cardiac: RRR; no murmurs  Triv  LE  edema  Respiratory:  clear to auscultation bilaterally GI: soft, nontender, nondistended, + BS  MS: no deformity Moving all extremities   Skin: warm and dry, no rash Neuro: Deferred   EKG:  EKG is not ordered today.  Prior CV Studies: Echo 06/06/18 Mod LVH, EF 40-45, diff HK, Gr 1 DD, MAC, trivial MR, mild LAE, trivial TR   Echo 03/06/2018 EF 15-20, diffuse HK, grade 2 diastolic dysfunction, trivial AI, MAC, trivial MR, moderate LAE, normal RVSF, mild TR, PASP 62   LHC 05/11/16 1. Normal coronary anatomy 2. Severe LV dysfunction with EF 30-35%. 3. Normal right heart and LV filling pressures. Lipid Panel    Component Value Date/Time   CHOL 148 09/15/2021 1014   TRIG 67 09/15/2021 1014   HDL 61 09/15/2021 1014   CHOLHDL 2.4 09/15/2021 1014   CHOLHDL 3.5 10/04/2015 0141   VLDL 16 10/04/2015 0141   LDLCALC 74 09/15/2021 1014   LDLDIRECT 174 (H) 06/08/2021 1215      Wt Readings from Last 3 Encounters:  03/30/22 173 lb 3.2 oz (78.6 kg)  03/02/22 176 lb 12.8 oz (80.2 kg)  02/10/22 174 lb 9.6 oz (79.2 kg)      ASSESSMENT AND PLAN:  1 Hx nonischemic cardiomyopathy  LVEF mod depressed on last echo  in 2019   TOday volume status is OK    Did not tolerate Entresto, made her feel weak.    Will check labs   her BP is higher today than usual (? Due to lack of sleep)    Keep on current meds for now. She is on carvedilol and spironolactone.    Review labs  Poss challenge with low dose ARB   Review labs   Consider Jardiance     2  HTN   Keep on current meds   BP is elevated this AM     3  HL   Did nto tolerate lipitor   Achy   Lipids are great on current dose of Crestor  Cotninue   4  DM   CHeck A1C   Discussed diet  Needs to severely limit candy and added sugar     5  Sleep   Getting at least 6 hours it appears but sched is way off.  Try ot cycle back so am appts don't mess sleep up  Plan for f/u in Fall  Will be in touch with test resuts  Current medicines are reviewed at length with the patient today.  The patient does not have concerns regarding medicines.  Signed, Dorris Carnes, MD  03/31/2022 10:31 AM    [address], [place], [place]  [address] Phone:  [phone]; Fax: [phone]

## 2022-03-30 ENCOUNTER — Encounter: Payer: Self-pay | Admitting: Internal Medicine

## 2022-03-30 ENCOUNTER — Ambulatory Visit: Payer: Medicare Other | Admitting: Internal Medicine

## 2022-03-30 VITALS — BP 142/84 | HR 63 | Ht 64.0 in | Wt 173.2 lb

## 2022-03-30 DIAGNOSIS — I10 Essential (primary) hypertension: Secondary | ICD-10-CM

## 2022-03-30 DIAGNOSIS — Z79899 Other long term (current) drug therapy: Secondary | ICD-10-CM | POA: Diagnosis not present

## 2022-03-30 LAB — BASIC METABOLIC PANEL
BUN/Creatinine Ratio: 18 (ref 12–28)
BUN: 18 mg/dL (ref 8–27)
CO2: 24 mmol/L (ref 20–29)
Calcium: 9.7 mg/dL (ref 8.7–10.3)
Chloride: 102 mmol/L (ref 96–106)
Creatinine, Ser: 1.02 mg/dL — ABNORMAL HIGH (ref 0.57–1.00)
Glucose: 101 mg/dL — ABNORMAL HIGH (ref 70–99)
Potassium: 4.8 mmol/L (ref 3.5–5.2)
Sodium: 139 mmol/L (ref 134–144)
eGFR: 57 mL/min/{1.73_m2} — ABNORMAL LOW (ref >59)

## 2022-03-30 LAB — HEMOGLOBIN A1C
Est. average glucose Bld gHb Est-mCnc: 123 mg/dL
Hgb A1c MFr Bld: 5.9 % — ABNORMAL HIGH (ref 4.8–5.6)

## 2022-03-30 LAB — MAGNESIUM: Magnesium: 2.3 mg/dL (ref 1.6–2.3)

## 2022-03-30 NOTE — Patient Instructions (Addendum)
Medication Instructions:   *If you need a refill on your cardiac medications before your next appointment, please call your pharmacy*   Lab Work: [employer], [email or web address] If you have labs (blood work) drawn today and your tests are completely normal, you will receive your results only by: [place] (if you have MyChart) OR A paper copy in the mail If you have any lab test that is abnormal or we need to change your treatment, we will call you to review the results.   Testing/Procedures: none   Follow-Up: At [facility], you and your health needs are our priority.  As part of our continuing mission to provide you with exceptional heart care, we have created designated Provider Care Teams.  These Care Teams include your primary Cardiologist (physician) and Advanced Practice Providers (APPs -  Physician Assistants and Nurse Practitioners) who all work together to provide you with the care you need, when you need it.  We recommend signing up for the patient portal called "MyChart".  Sign up information is provided on this After Visit Summary.  MyChart is used to connect with patients for Virtual Visits (Telemedicine).  Patients are able to view lab/test results, encounter notes, upcoming appointments, etc.  Non-urgent messages can be sent to your provider as well.   To learn more about what you can do with MyChart, go to [email or web address].    Your next appointment:   6 month(s)  The format for your next appointment:   In Person  Provider:   Dorris Carnes, MD     Other Instructions   Important Information About Sugar

## 2022-04-01 ENCOUNTER — Other Ambulatory Visit: Payer: Self-pay | Admitting: Family Medicine

## 2022-04-05 ENCOUNTER — Telehealth: Payer: Self-pay

## 2022-04-05 NOTE — Telephone Encounter (Signed)
I spoke with the pt and she reports that she could not afford the [facility] when she was on it... she declined trying again and declined for now seeing if we can help her through pt assistance program and says she is fine taking the Hydralazine TID and not switching but she will think about it and let us know.

## 2022-04-05 NOTE — Telephone Encounter (Signed)
-----   Message from Nason, MD sent at 04/04/2022  4:34 PM EDT ----- Electrolyte and kidney function are OK Hgb A1C is 5.9  Minimally elevated Magnesium is normal She was on Entresto in the past    Stopped after 2019   I don't see why     It is the better drug than Hydralazine    Please have her review

## 2022-04-06 ENCOUNTER — Other Ambulatory Visit: Payer: Self-pay | Admitting: Family Medicine

## 2022-04-25 ENCOUNTER — Other Ambulatory Visit: Payer: Self-pay | Admitting: Family Medicine

## 2022-04-25 ENCOUNTER — Other Ambulatory Visit: Payer: Self-pay

## 2022-04-25 DIAGNOSIS — M169 Osteoarthritis of hip, unspecified: Secondary | ICD-10-CM

## 2022-04-25 MED ORDER — HYDROCODONE-ACETAMINOPHEN 5-325 MG PO TABS: 1.0000 | ORAL_TABLET | Freq: Four times a day (QID) | ORAL | 0 refills | Status: DC | PRN

## 2022-05-05 ENCOUNTER — Ambulatory Visit: Payer: Medicare Other

## 2022-05-05 NOTE — Patient Instructions (Signed)
Visit Information  Donna Herrera  it was nice speaking with you. Please call me directly [phone] if you have questions about the goals we discussed.  Patient Goals/Self Care Activities: -Patient/Caregiver will self-administer medications as prescribed as evidenced by self-report/primary caregiver report , Patient/Caregiver will attend all scheduled provider appointments as evidenced by clinician review of documented attendance to scheduled appointments and patient/caregiver report, -Patient/Caregiver will call pharmacy for medication refills as evidenced by patient report and review of pharmacy fill history as appropriate,  -Patient/Caregiver will call provider office for new concerns or questions as evidenced by review of documented incoming telephone call notes and patient report,  -Patient/Caregiver verbalizes understanding of plan,   -Patient/Caregiver will focus on medication adherence by taking medications as prescribed -Avoid smoke and air pollution -Keep your airway clear from mucus build up  -Will call pharmacy for medication refills 7 days prior to needed refill date -Patient will use exercises given by physical therapy       The patient verbalized understanding of instructions, educational materials, and care plan provided today and agreed to receive a mailed copy of patient instructions, educational materials, and care plan.   Follow up Plan: Patient would like continued follow-up.  CM RNCM will outreach the patient within the next 8 weeks.  Patient will call office if needed prior to next encounter  Lazaro Arms, RN  [phone]

## 2022-05-05 NOTE — Chronic Care Management (AMB) (Signed)
Chronic Care Management   CCM RN Visit Note  05/05/2022 Name: Donna Herrera MRN: [id number] DOB: 1946/06/17  Subjective: Donna Herrera is a 76 y.o. year old female who is a primary care patient of Donna Key, DO. The care management team was consulted for assistance with disease management and care coordination needs.    Engaged with patient by telephone for follow up visit in response to provider referral for case management and/or care coordination services.   Consent to Services:  The patient was given information about Chronic Care Management services, agreed to services, and gave verbal consent prior to initiation of services.  Please see initial visit note for detailed documentation.   Patient agreed to services and verbal consent obtained.    Summary:  The patient continues to maintain positive progress with care plan goals. See Care Plan below for interventions and patient self-care actives.  Recommendation: The patient may benefit from taking medications as prescribed, exercising as tolerated, and The patient agrees.  Follow up Plan: Patient would like continued follow-up.  CM RNCM will outreach the patient within the next 8 weeks  Patient will call office if needed prior to next encounter   Assessment: Review of patient past medical history, allergies, medications, health status, including review of consultants reports, laboratory and other test data, was performed as part of comprehensive evaluation and provision of chronic care management services.   SDOH (Social Determinants of Health) assessments and interventions performed:  No  CCM Care Plan  Conditions to be addressed/monitored:COPD and Pain  Care Plan : RN Case Manager  Updates made by Donna Arms, RN since 05/05/2022 12:00 AM     Problem: COPD and Chronic Pain      Long-Range Goal: The patient will monitor and manage her COPD and Chronic pain   Start Date: 07/20/2021  Expected End  Date: 07/29/2022  Priority: High  Note:   Current Barriers:  Knowledge Deficits related to plan of care for management of COPD and Chronic Pain   RNCM Clinical Goal(s):  Patient will verbalize understanding of plan for management of COPD and Chronic Pain and work to manage and prevent exacerbation of symptoms  through collaboration with Consulting civil engineer, provider, and care team.   Interventions: 1:1 collaboration with primary care provider regarding development and update of comprehensive plan of care as evidenced by provider attestation and co-signature Inter-disciplinary care team collaboration (see longitudinal plan of care) Evaluation of current treatment plan related to  self management and patient's adherence to plan as established by provider   COPD: (Status: Goal on Track (progressing): YES.) Long term Advised patient to track and manage COPD triggers;  Advised patient to self assesses COPD action plan zone and make appointment with provider if in the yellow zone for 48 hours without improvement; Provided education about and advised patient to utilize infection prevention strategies to reduce risk of respiratory infection; The patient was provided contact information for question or concerns  Pain:  (Status: Goal on Track (progressing): YES.) Long term Pain assessment performed- 010 Reviewed provider established plan for pain management; Discussed importance of adherence to all scheduled medical appointments; Counseled on the importance of reporting any/all new or changed pain symptoms or management strategies to pain management provider; Discussed use of relaxation techniques and/or diversional activities to assist with pain reduction (distraction, imagery, relaxation, massage, acupressure, TENS, heat, and cold application; Reviewed with patient prescribed pharmacological and nonpharmacological pain relief strategies; 04/05/22: Today, Donna Herrera reported feeling well and having no  problems with her breathing. However, she mentioned experiencing some discomfort in her shoulders, hips, and knees. She is following her medication regimen and engaging in exercises to alleviate her pains. A follow-up evaluation is scheduled for September to determine if ongoing phone calls are necessary.   Patient Goals/Self Care Activities: -Patient/Caregiver will self-administer medications as prescribed as evidenced by self-report/primary caregiver report , Patient/Caregiver will attend all scheduled provider appointments as evidenced by clinician review of documented attendance to scheduled appointments and patient/caregiver report, -Patient/Caregiver will call pharmacy for medication refills as evidenced by patient report and review of pharmacy fill history as appropriate,  -Patient/Caregiver will call provider office for new concerns or questions as evidenced by review of documented incoming telephone call notes and patient report,  -Patient/Caregiver verbalizes understanding of plan,   -Patient/Caregiver will focus on medication adherence by taking medications as prescribed -Avoid smoke and air pollution -Keep your airway clear from mucus build up  -Will call pharmacy for medication refills 7 days prior to needed refill date -Patient will use exercises given by physical therapy       Donna Arms RN, BSN, [place] Management Coordinator [facility]  Phone: [phone]

## 2022-05-09 ENCOUNTER — Telehealth: Payer: Self-pay

## 2022-05-09 NOTE — Progress Notes (Unsigned)
    SUBJECTIVE:   CHIEF COMPLAINT / HPI:  No chief complaint on file.   On allopurinol 100 mg daily.  PERTINENT  PMH / PSH: ***  Patient Care Team: [name], DO as PCP - General (Family Medicine) Fay Records, MD as PCP - Cardiology (Cardiology) Lazaro Arms, RN as [place] Management   OBJECTIVE:   There were no vitals taken for this visit.  Physical Exam      02/10/2022    3:04 PM  Depression screen PHQ 2/9  Decreased Interest 0  Down, Depressed, Hopeless 0  PHQ - 2 Score 0  Altered sleeping 0  Tired, decreased energy 2  Change in appetite 0  Feeling bad or failure about yourself  0  Trouble concentrating 0  Moving slowly or fidgety/restless 0  Suicidal thoughts 0  PHQ-9 Score 2     {Show previous vital signs (optional):23777}  {Labs  Heme  Chem  Endocrine  Serology  Results Review (optional):23779}  ASSESSMENT/PLAN:   No problem-specific Assessment & Plan notes found for this encounter.    No follow-ups on file.   Zola Button, MD [place]

## 2022-05-09 NOTE — Telephone Encounter (Signed)
Patient returned call to nurse line. Scheduled for tomorrow morning with Dr. Nancy Fetter.   Talbot Grumbling, RN

## 2022-05-09 NOTE — Telephone Encounter (Signed)
Patient calls nurse line requesting to speak with provider regarding gout flare.   Reports that she is having a lot of pain left foot and toe. Onset since last night. Reports that she has been taking allopurinol daily and that she took a dose of colchicine today. Patient reports also taking hydrocodone-acetaminophen today. Pain continues to be unmanaged.    Reports that area is warm to the touch. States that pain is "in the joint."   Patient reports that she does not have a way to come in for an appointment. Advised patient to call insurance company to see if they could provide transportation. Offered to schedule patient appointment for the end of the week. Patient declined, stating that she will call back if she is able to get a ride.   Will forward to PCP for any further home management.   Talbot Grumbling, RN

## 2022-05-09 NOTE — Patient Instructions (Incomplete)
It was nice seeing you today!  Take two tablets of colchicine when you get home, then take another dose 1 hour later. Then, take colchicine once a day starting tomorrow until your gout flare-up is over.  Follow-up with Dr. Arby Barrette in a few weeks when you are feeling better. Ask about rechecking your uric acid levels and possibly increasing allopurinol dose.  Stay well, Zola Button, MD [place] [phone]  --  Make sure to check out at the front desk before you leave today.  Please arrive at least 15 minutes prior to your scheduled appointments.  If you had blood work today, I will send you a MyChart message or a letter if results are normal. Otherwise, I will give you a call.  If you had a referral placed, they will call you to set up an appointment. Please give [name] a call if you don't hear back in the next 2 weeks.  If you need additional refills before your next appointment, please call your pharmacy first.

## 2022-05-10 ENCOUNTER — Encounter: Payer: Self-pay | Admitting: Family Medicine

## 2022-05-10 ENCOUNTER — Other Ambulatory Visit: Payer: Self-pay | Admitting: Family Medicine

## 2022-05-10 ENCOUNTER — Ambulatory Visit: Payer: Medicare Other | Admitting: Family Medicine

## 2022-05-10 DIAGNOSIS — M1A09X0 Idiopathic chronic gout, multiple sites, without tophus (tophi): Secondary | ICD-10-CM | POA: Diagnosis not present

## 2022-05-10 DIAGNOSIS — M169 Osteoarthritis of hip, unspecified: Secondary | ICD-10-CM

## 2022-05-10 MED ORDER — ALLOPURINOL 100 MG PO TABS: 100.0000 mg | ORAL_TABLET | Freq: Every day | ORAL | 2 refills | Status: DC

## 2022-05-10 MED ORDER — HYDROCODONE-ACETAMINOPHEN 5-325 MG PO TABS: 1.0000 | ORAL_TABLET | Freq: Four times a day (QID) | ORAL | 0 refills | Status: DC | PRN

## 2022-05-10 NOTE — Progress Notes (Signed)
Patient was seen today in clinic and requests early refill of her Norco given her acute gout flare. PDMP reviewed. Will do a one time early refill on medication.

## 2022-05-10 NOTE — Assessment & Plan Note (Addendum)
History and exam consistent with gout flare of bilateral great toes despite being on urate-lowering agent. Low suspicion for septic arthritis. Diuretic agents would increase risk of gout. - colchicine 1.2 mg, then 0.6 mg 1 hour later; then colchicine 0.6 mg daily starting tomorrow until flare-up resolves - continue allopurinol - avoid NSAIDs given HFmrEF - message sent to PCP regarding request for refill on hydrocodone-acetaminophen - recommend checking uric acid level at follow-up when acute flare-up resolved and titration of allopurinol if indicated

## 2022-05-18 ENCOUNTER — Other Ambulatory Visit: Payer: Self-pay | Admitting: Family Medicine

## 2022-05-18 DIAGNOSIS — M1A09X0 Idiopathic chronic gout, multiple sites, without tophus (tophi): Secondary | ICD-10-CM

## 2022-05-28 ENCOUNTER — Emergency Department (HOSPITAL_COMMUNITY)
Admission: EM | Admit: 2022-05-28 | Discharge: 2022-05-28 | Disposition: A | Discharge status: Home / Self Care | Payer: Medicare Other | Attending: Emergency Medicine | Admitting: Emergency Medicine

## 2022-05-28 ENCOUNTER — Encounter (HOSPITAL_COMMUNITY): Payer: Self-pay | Admitting: Emergency Medicine

## 2022-05-28 DIAGNOSIS — Z7951 Long term (current) use of inhaled steroids: Secondary | ICD-10-CM | POA: Insufficient documentation

## 2022-05-28 DIAGNOSIS — M62830 Muscle spasm of back: Secondary | ICD-10-CM | POA: Diagnosis not present

## 2022-05-28 DIAGNOSIS — I5042 Chronic combined systolic (congestive) and diastolic (congestive) heart failure: Secondary | ICD-10-CM | POA: Diagnosis not present

## 2022-05-28 DIAGNOSIS — R509 Fever, unspecified: Secondary | ICD-10-CM | POA: Diagnosis not present

## 2022-05-28 DIAGNOSIS — Z87891 Personal history of nicotine dependence: Secondary | ICD-10-CM | POA: Insufficient documentation

## 2022-05-28 DIAGNOSIS — R252 Cramp and spasm: Secondary | ICD-10-CM | POA: Insufficient documentation

## 2022-05-28 DIAGNOSIS — Z79899 Other long term (current) drug therapy: Secondary | ICD-10-CM | POA: Diagnosis not present

## 2022-05-28 DIAGNOSIS — Z7982 Long term (current) use of aspirin: Secondary | ICD-10-CM | POA: Insufficient documentation

## 2022-05-28 DIAGNOSIS — Z96641 Presence of right artificial hip joint: Secondary | ICD-10-CM | POA: Diagnosis not present

## 2022-05-28 DIAGNOSIS — I110 Hypertensive heart disease with heart failure: Secondary | ICD-10-CM | POA: Diagnosis not present

## 2022-05-28 DIAGNOSIS — Z743 Need for continuous supervision: Secondary | ICD-10-CM | POA: Diagnosis not present

## 2022-05-28 DIAGNOSIS — R0789 Other chest pain: Secondary | ICD-10-CM | POA: Diagnosis not present

## 2022-05-28 LAB — COMPREHENSIVE METABOLIC PANEL
ALT: 13 U/L (ref 0–44)
AST: 20 U/L (ref 15–41)
Albumin: 4.4 g/dL (ref 3.5–5.0)
Alkaline Phosphatase: 86 U/L (ref 38–126)
Anion gap: 9 (ref 5–15)
BUN: 26 mg/dL — ABNORMAL HIGH (ref 8–23)
CO2: 26 mmol/L (ref 22–32)
Calcium: 9.8 mg/dL (ref 8.9–10.3)
Chloride: 105 mmol/L (ref 98–111)
Creatinine, Ser: 1.17 mg/dL — ABNORMAL HIGH (ref 0.44–1.00)
GFR, Estimated: 48 mL/min — ABNORMAL LOW (ref >60)
Glucose, Bld: 96 mg/dL (ref 70–99)
Potassium: 4.8 mmol/L (ref 3.5–5.1)
Sodium: 140 mmol/L (ref 135–145)
Total Bilirubin: 0.4 mg/dL (ref 0.3–1.2)
Total Protein: 8.9 g/dL — ABNORMAL HIGH (ref 6.5–8.1)

## 2022-05-28 LAB — CBC WITH DIFFERENTIAL/PLATELET
Abs Immature Granulocytes: 0.01 10*3/uL (ref 0.00–0.07)
Basophils Absolute: 0 10*3/uL (ref 0.0–0.1)
Basophils Relative: 1 %
Eosinophils Absolute: 0.2 10*3/uL (ref 0.0–0.5)
Eosinophils Relative: 3 %
HCT: 41.1 % (ref 36.0–46.0)
Hemoglobin: 12.7 g/dL (ref 12.0–15.0)
Immature Granulocytes: 0 %
Lymphocytes Relative: 30 %
Lymphs Abs: 1.8 10*3/uL (ref 0.7–4.0)
MCH: 29.1 pg (ref 26.0–34.0)
MCHC: 30.9 g/dL (ref 30.0–36.0)
MCV: 94.1 fL (ref 80.0–100.0)
Monocytes Absolute: 0.6 10*3/uL (ref 0.1–1.0)
Monocytes Relative: 9 %
Neutro Abs: 3.5 10*3/uL (ref 1.7–7.7)
Neutrophils Relative %: 57 %
Platelets: 234 10*3/uL (ref 150–400)
RBC: 4.37 MIL/uL (ref 3.87–5.11)
RDW: 14.4 % (ref 11.5–15.5)
WBC: 6 10*3/uL (ref 4.0–10.5)
nRBC: 0 % (ref 0.0–0.2)

## 2022-05-28 LAB — CK: Total CK: 239 U/L — ABNORMAL HIGH (ref 38–234)

## 2022-05-28 LAB — TROPONIN I (HIGH SENSITIVITY): Troponin I (High Sensitivity): 7 ng/L (ref <18)

## 2022-05-28 LAB — MAGNESIUM: Magnesium: 2.4 mg/dL (ref 1.7–2.4)

## 2022-05-28 MED ORDER — FENTANYL CITRATE PF 50 MCG/ML IJ SOSY
50.0000 ug | PREFILLED_SYRINGE | Freq: Once | INTRAMUSCULAR | Status: AC
  Administered 2022-05-28: 50 ug via INTRAVENOUS
  Filled 2022-05-28: qty 1

## 2022-05-28 NOTE — ED Triage Notes (Signed)
Pt BIB [facility] from home. She called out abdominal, back and upper extremity cramping. Upon arrival she states that she feels much better. A&Ox4.

## 2022-05-28 NOTE — ED Provider Notes (Signed)
[facility] DEPT Provider Note  CSN: [id number] Arrival date & time: 05/28/22 0137  Chief Complaint(s) Cramping  HPI Donna Herrera is a 76 y.o. female with extensive past medical history listed below who presents to the emergency department with recurrent muscle cramping in her back, chest, arms.  This has been a chronic issue.  Over the last 3 days she has had more cramping than usual.  She does report lying in the bed most of the time for the past 3 days due to low energy.  She denies any recent fevers or infections.  No no chest pain or shortness of breath.  No nausea or vomiting.  No fall or trauma.  No other physical complaints.  HPI  Past Medical History Past Medical History:  Diagnosis Date   Adenomatous polyp 03/23/2020   Colonoscopy 2021 notable for adenomatous polyp. Per GI, no further follow up colonoscopies recommended given patient's age.   Allergy    Arthritis    Atherosclerosis of aorta ([place]) 07/15/2021   Candidiasis, mouth 10/19/2017   Chronic combined systolic and diastolic CHF (congestive heart failure) ([place]) 10/21/2015   Echo 3/16: EF 40%, diffuse HK, mild MR, moderate LAE, mild RAE, PASP 42 mmHg  //  Echo 12/16: Mild LVH, EF 35-40%, diffuse HK, Gr 2 DD, MAC, mild MR, mod LAE, normal RVSF, PASP 49 mmHg // Echo 5/19: EF 15-20, diff HK, Gr 2 DD, trivial AI, MAC, trivial MR, mod LAE, normal RVSF, mild TR, PASP 62 // Limited Echo 8/19: mod LVH, EF 40-45, diff HK, Gr 1 DD, MAC, mild LAE    Chronic left shoulder pain 04/10/2013   Chronic lung disease    Chronic pain    Chronic pain syndrome 07/20/2017   Confusion 06/08/2021   Depression 08/25/2016   DJD (degenerative joint disease) 10/12/2012   Multiple joint replacements by Dr. Collier Salina.    Dyshidrotic eczema 12/31/2014   Edentulous 07/19/2021   Emphysema lung ([place]) 03/02/2021   Long smoking history.  Noted on CT Low Dose Chest: 01/2021: IMPRESSION: Chronic interstitial changes raise the  question of pulmonary fibrosis. High-resolution chest CT may prove helpful to further evaluate as clinically warranted.Emphysema (ICD10-J43.9) and Aortic Atherosclerosis (ICD10-170.0)  Recommended pulmonology referral but patient declined. Recommend continued discussion and symptom monitor   Essential hypertension 07/20/2017   GERD (gastroesophageal reflux disease)    Gout    H/O slipped capital femoral epiphysis (SCFE)    Heart failure, left, with LVEF 41-49% ([place]) 10/21/2015   transthoracic echocardiogram 06/06/18: EF appears improved to 40-45% with diffuse hypokinesis  A. Echo 3/16: EF 40%, diffuse HK, mild MR, moderate LAE, mild RAE, PASP 42 mmHg  //  B. Echo 12/16: Mild LVH, EF 35-40%, diffuse HK, grade 2 diastolic dysfunction, MAC, mild MR, moderate LAE, normal RVSF, PASP 49 mmHg   History of total right hip replacement 04/02/2019   Followed closely with orthopedist Dr. Alvan Dame. Patient is s/p total right hip replacement in 1980's with evidence of bone loss and osteopenia making reconstruction unlikely. Plan for conservative treatment at this time with crutches for ambulation and pain control.   Hyperlipidemia    Hypertension    Hypertensive heart disease with CHF (congestive heart failure) ([place]) 10/12/2012   Loss of taste 02/06/2018   Mass of breast, left 07/21/2014   NICM (nonischemic cardiomyopathy) ([place]) 06/09/2016   A. LHC 7/17: Normal coronary arteries, EF 30-35%, LVEDP 14 mmHg   Pain in gums 04/18/2018   Pre-diabetes    Pulmonary fibrosis ([facility])  03/02/2021   CT Low Dose Chest: 01/2021: IMPRESSION: Chronic interstitial changes raise the question of pulmonary fibrosis. High-resolution chest CT may prove helpful to further evaluate as clinically warranted.Emphysema (ICD10-J43.9) and Aortic Atherosclerosis (ICD10-170.0)  Recommended pulmonology referral but patient declined. Recommend continued discussion and symptom monitoring.   Pulmonary nodule 1 cm or greater in diameter 03/02/2021   CT low dose  Chest: 01/2021: 1. Lung-RADS 4A, suspicious. 10 mm posterior left lower lobe pulmonary nodule, possibly scarring. Follow up low-dose chest CT without contrast in 3 months (please use the following order, "CT CHEST LCS NODULE FOLLOW-UP W/O CM") is recommended. Alternatively, PET may be considered when there is a solid component 66m or larger.  Plan: Follow up low dose CT ordered in 3 mon   Rib pain on left side 09/17/2020   Right hip pain 11/02/2012   Tendonitis, Achilles, left 05/24/2017   Tobacco abuse 09/29/2014   Urge incontinence 07/19/2021   Vitamin D deficiency 12/31/2014   Patient Active Problem List   Diagnosis Date Noted   Low energy 03/05/2022   Osteoarthritis 10/26/2021   Positive depression screening 07/19/2021   Edentulous 07/19/2021   Urge incontinence 07/19/2021   Osteoarthritis of shoulders, bilateral 07/19/2021   mild cognitive impairment 07/19/2021   At high risk for falls 07/16/2021   Difficulty rising from chair 07/16/2021   A[place]rosclerosis of aorta ([place] 07/15/2021   Dizziness 06/08/2021   Hallux valgus (acquired), left foot 06/08/2021   Numbness and tingling of both feet 06/08/2021   Emphysema lung ([place] 03/02/2021   Pulmonary fibrosis ([place] 03/02/2021   Pulmonary nodule 1 cm or greater in diameter 03/02/2021   Degenerative disc disease, cervical 11/26/2020   Rotator cuff arthropathy of both shoulders 11/26/2020   History of tobacco abuse 03/26/2020   Prediabetes 03/26/2020   Pain in both feet 10/28/2019   Essential hypertension 07/20/2017   NICM (nonischemic cardiomyopathy) ([place] 06/09/2016   HFmrEF (Heart Failure with mildly reduced EF) 10/21/2015   Vitamin D deficiency 12/31/2014   Hyperlipidemia 09/16/2013   Hip pain, right 11/02/2012   DJD (degenerative joint disease) 10/12/2012   Encounter for chronic pain management 10/12/2012   Gout 10/12/2012   GERD (gastroesophageal reflux disease) 10/12/2012   Home Medication(s) Prior to Admission medications    Medication Sig Start Date End Date Taking? Authorizing Provider  acetaminophen (TYLENOL) 500 MG tablet Take 1,000 mg by mouth daily as needed for mild pain or moderate pain.    [provider]  albuterol (VENTOLIN HFA) 108 (90 Base) MCG/ACT inhaler INHALE 1 TO 2 PUFFS BY MOUTH EVERY 6 HOURS AS NEEDED FOR WHEEZE 04/26/22   PArby Barrette VWeldon Picking DO  allopurinol (ZYLOPRIM) 100 MG tablet Take 1 tablet (100 mg total) by mouth daily. 05/10/22   SZola Button MD  aspirin 81 MG tablet Take 1 tablet (81 mg total) by mouth daily. 04/16/15   MLeeanne Rio MD  atorvastatin (LIPITOR) 40 MG tablet Take 40 mg by mouth daily. 09/07/21   [provider]  baclofen (LIORESAL) 10 MG tablet TAKE 1 TABLET (10 MG TOTAL) BY MOUTH 3 (THREE) TIMES DAILY. WILL REFILL FOR 2 MONTH SUPPLY, PLEASE SCHEDULE APPOINTMENT WITH PCP FOR FOLLOW-UP FOR FUTURE REFILLS. 04/04/22   PShary Key DO  benzonatate (TESSALON) 100 MG capsule as needed. 12/27/21   [provider]  Blood Glucose Monitoring Suppl (ONETOUCH VERIO) w/Device KIT Please use to check blood sugar once daily. E11.9 06/12/20   Mullis, Kiersten P, DO  calcium carbonate (TUMS - DOSED  IN MG ELEMENTAL CALCIUM) 500 MG chewable tablet Chew 1 tablet by mouth daily as needed for indigestion or heartburn.    [provider]  carvedilol (COREG) 3.125 MG tablet TAKE 1 TABLET BY MOUTH 2 TIMES DAILY. 01/15/22   [name], DO  cetirizine (ZYRTEC) 10 MG tablet Take 10 mg by mouth daily as needed for allergies.    [provider]  chlorhexidine gluconate, MEDLINE KIT, (PERIDEX) 0.12 % solution Use as directed 15 mLs in the mouth or throat 2 (two) times daily. 04/08/21   Mullis, Kiersten P, DO  Cholecalciferol (VITAMIN D3) 125 MCG (5000 UT) CAPS Take 1 capsule (5,000 Units total) by mouth daily. 01/19/22   Fay Records, MD  colchicine 0.6 MG tablet TAKE 1 TABLET BY MOUTH EVERY DAY 05/18/22   [name], DO  diclofenac Sodium  (VOLTAREN) 1 % GEL Apply 4 g topically 4 (four) times daily. 06/18/21   [name], DO  DULoxetine (CYMBALTA) 30 MG capsule TAKE 1 CAPSULE(30 MG) BY MOUTH DAILY 04/06/22   Arby Barrette, [place] J, DO  famotidine (PEPCID) 20 MG tablet TAKE 1 TABLET (20 MG TOTAL) BY MOUTH DAILY AS NEEDED FOR HEARTBURN OR INDIGESTION. 03/07/22   Arby Barrette, Victoria J, DO  fluticasone (FLONASE) 50 MCG/ACT nasal spray Place 2 sprays into both nostrils daily as needed for allergies or rhinitis.    [provider]  fluticasone (FLONASE) 50 MCG/ACT nasal spray Place 2 sprays into both nostrils daily. 02/10/22   [name], DO  furosemide (LASIX) 20 MG tablet TAKE 20 MG TABLET BY MOUTH ON Monday, WED, FRI 01/13/22   Fay Records, MD  gabapentin (NEURONTIN) 300 MG capsule TAKE 1 CAPSULE BY MOUTH EVERYDAY AT BEDTIME 04/04/22   Paige, [place] J, DO  hydrALAZINE (APRESOLINE) 50 MG tablet TAKE 1 TABLET BY MOUTH THREE TIMES A DAY 01/13/22   Fay Records, MD  HYDROcodone-acetaminophen (NORCO) 5-325 MG tablet Take 1 tablet by mouth every 6 (six) hours as needed for moderate pain. 05/10/22   [name], DO  Lancet Devices (ONE TOUCH DELICA LANCING DEV) MISC Please use to check blood sugar once daily. E11.9 06/12/20   Mullis, Kiersten P, DO  Multiple Vitamin (MULTIVITAMIN WITH MINERALS) TABS tablet Take 1 tablet by mouth daily.    [provider]  OneTouch Delica Lancets 59D MISC Please use to check blood sugar once daily. E11.9 06/12/20   Mullis, Kiersten P, DO  ONETOUCH VERIO test strip PLEASE USE TO CHECK BLOOD SUGAR ONCE DAILY. E11.9 06/17/21   Maness, Arnette Norris, MD  Potassium Chloride ER 20 MEQ TBCR TAKE 1 TABLET BY MOUTH AS NEEDED (WHEN TAKING LASIX). 04/26/22   [name], DO  rosuvastatin (CRESTOR) 20 MG tablet Take 1 tablet (20 mg total) by mouth daily. 07/21/21   Fay Records, MD  spironolactone (ALDACTONE) 25 MG tablet Take 0.5 tablets (12.5 mg total) by mouth daily. 06/08/21   Ezequiel Essex, MD   triamcinolone ointment (KENALOG) 0.5 % Apply 1 application topically 2 (two) times daily.    [provider]  Allergies Crab [shellfish allergy], Morphine and related, and Atorvastatin  Review of Systems Review of Systems As noted in HPI  Physical Exam Vital Signs  I have reviewed the triage vital signs BP 137/62   Pulse 61   Temp 98.1 F (36.7 C) (Oral)   Resp 19   Ht 5' 4" (1.626 m)   Wt 81.6 kg   SpO2 98%   BMI 30.90 kg/m   Physical Exam Vitals reviewed.  Constitutional:      General: She is not in acute distress.    Appearance: She is well-developed. She is not diaphoretic.  HENT:     Head: Normocephalic and atraumatic.     Right Ear: External ear normal.     Left Ear: External ear normal.     Nose: Nose normal.  Eyes:     General: No scleral icterus.    Conjunctiva/sclera: Conjunctivae normal.  Neck:     Trachea: Phonation normal.  Cardiovascular:     Rate and Rhythm: Normal rate and regular rhythm.  Pulmonary:     Effort: Pulmonary effort is normal. No respiratory distress.     Breath sounds: No stridor.  Abdominal:     General: There is no distension.  Musculoskeletal:        General: Normal range of motion.     Cervical back: Normal range of motion. No spasms, tenderness or bony tenderness.     Thoracic back: Spasms and tenderness present. No bony tenderness.       Back:     Comments: Movement causes her to cramp in back and arms  Neurological:     Mental Status: She is alert and oriented to person, place, and time.  Psychiatric:        Behavior: Behavior normal.     ED Results and Treatments Labs (all labs ordered are listed, but only abnormal results are displayed) Labs Reviewed  COMPREHENSIVE METABOLIC PANEL - Abnormal; Notable for the following components:      Result Value   BUN 26 (*)    Creatinine,  Ser 1.17 (*)    Total Protein 8.9 (*)    GFR, Estimated 48 (*)    All other components within normal limits  CK - Abnormal; Notable for the following components:   Total CK 239 (*)    All other components within normal limits  CBC WITH DIFFERENTIAL/PLATELET  MAGNESIUM  TROPONIN I (HIGH SENSITIVITY)                                                                                                                         EKG  EKG Interpretation  Date/Time:  Saturday May 28 2022 02:34:31 EDT Ventricular Rate:  66 PR Interval:  162 QRS Duration: 101 QT Interval:  438 QTC Calculation: 459 R Axis:   -27 Text Interpretation: Sinus rhythm Ventricular premature complex Borderline left axis deviation Low voltage, precordial leads Abnormal R-wave progression, late transition No significant change was found Confirmed by Addison Lank (  [id number]) on 05/28/2022 2:59:36 AM       Radiology No results found.  Pertinent labs & imaging results that were available during my care of the patient were reviewed by me and considered in my medical decision making (see MDM for details).  Medications Ordered in ED Medications  fentaNYL (SUBLIMAZE) injection 50 mcg (50 mcg Intravenous Given 05/28/22 0247)                                                                                                                                     Procedures Procedures  (including critical care time)  Medical Decision Making / ED Course    Complexity of Problem:  Patient's presenting problem/concern, DDX, and MDM listed below: Muscle cramping We will obtain screening labs to assess for any electrolyte derangements, evidence of dehydration given her past medical history and use of diuretics. Doubt CV etiology, but given co-morbidities, will get EKG and trop    Complexity of Data:   Cardiac Monitoring: The patient was maintained on a cardiac monitor.   I personally viewed and interpreted the cardiac monitored  which showed an underlying rhythm of NSR EKG without acute ischemic changes  Laboratory Tests ordered listed below with my independent interpretation: CBC without leukocytosis or anemia Metabolic panel without significant electrolyte derangement. Mild renal insufficiency close to baseline. CK close to baseline Trop negative    ED Course:    Assessment, Add'l Intervention, and Reassessment: Muscle cramps Improved with low dose fentanyl. Recommended increased mobility at home w/o overexertion  PCP follow up   Final Clinical Impression(s) / ED Diagnoses Final diagnoses:  Muscle cramps   The patient appears reasonably screened and/or stabilized for discharge and I doubt any other medical condition or other [facility] requiring further screening, evaluation, or treatment in the ED at this time. I have discussed the findings, Dx and Tx plan with the patient/family who expressed understanding and agree(s) with the plan. Discharge instructions discussed at length. The patient/family was given strict return precautions who verbalized understanding of the instructions. No further questions at time of discharge.  Disposition: Discharge  Condition: Good  ED Discharge Orders     None         Follow Up: Primary care provider  Call  to schedule an appointment for close follow up            This chart was dictated using voice recognition software.  Despite best efforts to proofread,  errors can occur which can change the documentation meaning.    Fatima Blank, MD 05/28/22 [phone]

## 2022-06-01 ENCOUNTER — Other Ambulatory Visit: Payer: Self-pay | Admitting: Internal Medicine

## 2022-06-01 DIAGNOSIS — I10 Essential (primary) hypertension: Secondary | ICD-10-CM

## 2022-06-05 ENCOUNTER — Other Ambulatory Visit: Payer: Self-pay | Admitting: Family Medicine

## 2022-06-24 ENCOUNTER — Ambulatory Visit: Payer: Medicare Other

## 2022-06-25 NOTE — Chronic Care Management (AMB) (Signed)
RNCM Care Management Note 06/24/22 Name: Donna Herrera MRN: [id number] DOB: Feb 02, 1946   Donna Herrera is a 76 y.o. year old female who sees [name], DO for primary care. RNCM was consulted  to assistance patient with  Care Coordination.      Engaged with patient by telephone for follow up visit in response to provider referral for Care Coordination.     Summary: Patient is currently experiencing symptoms of  pain and swelling in her right hand and joints which seems to be exacerbated by gout.. See Care Plan below for interventions and patient self-care actives.  Recommendation: The patient may benefit from taking medications as prescribed and The patient agrees.  Follow up Plan: Patient would like continued follow-up.  CM RNCM will outreach the patient within the next 4 weeks.  Patient will call office if needed prior to next encounter   SDOH (Social Determinants of Health) screening and interventions performed today: no     RN Plan of Care for Conditions/Un Met Needs Addressed and Monitored  Patient Care Plan: RN Case Manager     Problem Identified: COPD and Chronic Pain Resolved 06/24/2022     Long-Range Goal: The patient will monitor and manage her COPD and Chronic pain Completed 06/24/2022  Start Date: 07/20/2021  Expected End Date: 07/29/2022  Priority: High  Note:   Current Barriers:  Knowledge Deficits related to plan of care for management of COPD and Chronic Pain   RNCM Clinical Goal(s):  Patient will verbalize understanding of plan for management of COPD and Chronic Pain and work to manage and prevent exacerbation of symptoms  through collaboration with Consulting civil engineer, provider, and care team.   Interventions: 1:1 collaboration with primary care provider regarding development and update of comprehensive plan of care as evidenced by provider attestation and co-signature Inter-disciplinary care team collaboration (see longitudinal plan  of care) Evaluation of current treatment plan related to  self management and patient's adherence to plan as established by provider   COPD: (Status: Goal on Track (progressing): YES.) Long term Advised patient to track and manage COPD triggers;  Advised patient to self assesses COPD action plan zone and make appointment with provider if in the yellow zone for 48 hours without improvement; Provided education about and advised patient to utilize infection prevention strategies to reduce risk of respiratory infection; The patient was provided contact information for question or concerns  Pain:  (Status: Goal on Track (progressing): YES.) Long term Pain assessment performed- 010 Reviewed provider established plan for pain management; Discussed importance of adherence to all scheduled medical appointments; Counseled on the importance of reporting any/all new or changed pain symptoms or management strategies to pain management provider; Discussed use of relaxation techniques and/or diversional activities to assist with pain reduction (distraction, imagery, relaxation, massage, acupressure, TENS, heat, and cold application; Reviewed with patient prescribed pharmacological and nonpharmacological pain relief strategies; 06/24/22: Donna Herrera reported no breathing issues, but joint and finger pain in her right hand. I asked about gout but she hadn't considered it. She stated that she will take some of heer mediction to see if it helps. She has a follow-up with Dr. Arby Barrette on 07/19/22 at 9:10 am to discuss.   Patient Goals/Self Care Activities: -Patient/Caregiver will self-administer medications as prescribed as evidenced by self-report/primary caregiver report , Patient/Caregiver will attend all scheduled provider appointments as evidenced by clinician review of documented attendance to scheduled appointments and patient/caregiver report, -Patient/Caregiver will call pharmacy for medication refills as  evidenced by patient report and review of pharmacy fill history as appropriate,  -Patient/Caregiver will call provider office for new concerns or questions as evidenced by review of documented incoming telephone call notes and patient report,  -Patient/Caregiver verbalizes understanding of plan,   -Patient/Caregiver will focus on medication adherence by taking medications as prescribed -Avoid smoke and air pollution -Keep your airway clear from mucus build up  -Will call pharmacy for medication refills 7 days prior to needed refill date -Patient will use exercises given by physical therapy  Resolving due to duplicate goal          Lazaro Arms RN, BSN, [place] Management Coordinator [facility]   Phone: [phone]

## 2022-06-25 NOTE — Patient Instructions (Signed)
Visit Information  [name]  it was nice speaking with you. Please call me directly [phone] if you have questions about the goals we discussed.  Patient Goals/Self Care Activities: -Patient/Caregiver will self-administer medications as prescribed as evidenced by self-report/primary caregiver report , Patient/Caregiver will attend all scheduled provider appointments as evidenced by clinician review of documented attendance to scheduled appointments and patient/caregiver report, -Patient/Caregiver will call pharmacy for medication refills as evidenced by patient report and review of pharmacy fill history as appropriate,  -Patient/Caregiver will call provider office for new concerns or questions as evidenced by review of documented incoming telephone call notes and patient report,  -Patient/Caregiver verbalizes understanding of plan,   -Patient/Caregiver will focus on medication adherence by taking medications as prescribed -Avoid smoke and air pollution -Keep your airway clear from mucus build up  -Will call pharmacy for medication refills 7 days prior to needed refill date -Patient will use exercises given by physical therapy   The patient verbalized understanding of instructions, educational materials, and care plan provided today .  Follow up Plan: Patient would like continued follow-up.  CM RNCM will outreach the patient within the next 4 weeks.  Patient will call office if needed prior to next encounter  Lazaro Arms, RN  [phone]

## 2022-06-30 ENCOUNTER — Other Ambulatory Visit: Payer: Self-pay

## 2022-06-30 DIAGNOSIS — M169 Osteoarthritis of hip, unspecified: Secondary | ICD-10-CM

## 2022-07-01 MED ORDER — HYDROCODONE-ACETAMINOPHEN 5-325 MG PO TABS: 1.0000 | ORAL_TABLET | Freq: Four times a day (QID) | ORAL | 0 refills | Status: DC | PRN

## 2022-07-06 ENCOUNTER — Telehealth: Payer: Medicare Other

## 2022-07-19 ENCOUNTER — Other Ambulatory Visit (HOSPITAL_COMMUNITY): Payer: Self-pay

## 2022-07-19 ENCOUNTER — Ambulatory Visit (INDEPENDENT_AMBULATORY_CARE_PROVIDER_SITE_OTHER): Payer: Medicare Other | Admitting: Family Medicine

## 2022-07-19 VITALS — BP 113/72 | HR 67 | Ht 64.0 in | Wt 176.6 lb

## 2022-07-19 DIAGNOSIS — M25551 Pain in right hip: Secondary | ICD-10-CM

## 2022-07-19 DIAGNOSIS — M169 Osteoarthritis of hip, unspecified: Secondary | ICD-10-CM

## 2022-07-19 DIAGNOSIS — Z23 Encounter for immunization: Secondary | ICD-10-CM | POA: Diagnosis not present

## 2022-07-19 MED ORDER — LIDOCAINE 5 % EX PTCH
1.0000 | MEDICATED_PATCH | CUTANEOUS | 0 refills | Status: DC
Start: 2022-07-19 — End: 2023-12-02

## 2022-07-19 MED ORDER — HYDROCODONE-ACETAMINOPHEN 5-325 MG PO TABS: 1.0000 | ORAL_TABLET | Freq: Four times a day (QID) | ORAL | 0 refills | Status: DC | PRN

## 2022-07-19 NOTE — Progress Notes (Signed)
    SUBJECTIVE:   CHIEF COMPLAINT / HPI:   Ms. Cillo is a 76 yo who presents for follow up.  She was seen in the ED on 7/29 for muscle cramps which she has had chronically, but had worsened over the past few days before coming to the ED.  Labs significant for slightly increased creatinine at 1.17 baseline is around 0.8.  CK and magnesium were normal. Today she reports she started feeling the cramping again last week, then took potassium and it eased off. She states she does feel she is dehydrated and her mouth has been dry lately.   She also endorses having pretty significant low back pain and right hip pain. Has artificial right hip which has been bothering her. She states the orthopedic surgeon told her that there is no procedure she would be able to do given that she is too high risk.  Tried physical therapy in the past and does not want to do that at this time.  Is currently managing her pain with Tylenol which she states she is popping like candy as well as her Norco.  She asks for an increase in her Norco.  PERTINENT  PMH / PSH: Reviewed   OBJECTIVE:   BP 113/72   Pulse 67   Ht '5\' 4"'$  (1.626 m)   Wt 176 lb 9.6 oz (80.1 kg)   SpO2 100%   BMI 30.31 kg/m    Physical exam General: well appearing, NAD Cardiovascular: RRR, no murmurs Lungs: CTAB. Normal WOB Abdomen: soft, non-distended, non-tender Skin: warm, dry. No edema MSK: R sided lumbar region tender to light palpation. No bruising or physical deformity    ASSESSMENT/PLAN:   Hip pain, right Patient with significant chronic lumbar and right hip pain. History of right hip replacement. Currently too high risk for any further operations so managing her pain conservatively. She has done physical therapy in the past and declines PT today. Recommend she continue to do stretches and can alternate between ice and heat. Prescribed a lidocaine patch to apply to the lower back. Refilled Norco and can increase from 15 pills to 20  pills at next refill. Discussed return precautions.    Health maintenance Flu vaccine given   [place]

## 2022-07-19 NOTE — Patient Instructions (Addendum)
It was great seeing you today!  You came in for muscle cramps and I am glad they have eased off.  For your pain I have prescribed a lidocaine patch and also refilled your Norco   Visit Reminders: - Stop by the pharmacy to pick up your prescriptions  - Continue to work on your healthy eating habits and incorporating exercise into your daily life.   Feel free to call with any questions or concerns at any time, at [phone].   Take care,  Dr. [name] [facility]

## 2022-07-20 ENCOUNTER — Ambulatory Visit: Payer: Self-pay

## 2022-07-20 NOTE — Patient Outreach (Signed)
  Care Coordination   Follow Up Visit Note   07/20/2022 Name: Donna Herrera MRN: [id number] DOB: 19-Jul-1946  Donna Herrera is a 76 y.o. year old female who sees [name], DO for primary care. I spoke with  [name] by phone today.  What matters to the patients health and wellness today?  I can't afford my medication    Goals Addressed             This Visit's Progress    I want to be able to manage my symptoms       Care Coordination Interventions: Active listening / Reflection utilized  Problem [place] strategies reviewed Discussed the importance of adequate rest and management of fatigue with COPD Medications reviewed Discussed use of relaxation techniques and/or diversional activities to assist with pain reduction (distraction, imagery, relaxation, massage, acupressure, TENS, heat, and cold application Reviewed with patient prescribed pharmacological and nonpharmacological pain relief strategies During my conversation with the patient, she mentioned that she was experiencing joint and hand pain that felt like arthritis. The doctor prescribed Lidocaine patches 5% to help with the pain. However, the patient is unable to afford them as the pharmacy quoted a price of $80. To assist her with the cost, I contacted Rosendo Gros in the pharmacy in the clinic. Additionally, the patient reported that she has not experienced any breathing problems and is taking her medications as prescribed.         SDOH assessments and interventions completed:  No     Care Coordination Interventions Activated:  Yes  Care Coordination Interventions:  Yes, provided   Follow up plan: Follow up call scheduled for 10/20 245 pm    Encounter Outcome:  Pt. Visit Completed   .Lazaro Arms RN, BSN, [facility]   Phone: [phone]

## 2022-07-20 NOTE — Patient Instructions (Signed)
Visit Information  Thank you for taking time to visit with me today. Please don't hesitate to contact me if I can be of assistance to you.   Following are the goals we discussed today:   Goals Addressed             This Visit's Progress    I want to be able to manage my symptoms       Care Coordination Interventions: Active listening / Reflection utilized  Problem [place] strategies reviewed Discussed the importance of adequate rest and management of fatigue with COPD Medications reviewed Discussed use of relaxation techniques and/or diversional activities to assist with pain reduction (distraction, imagery, relaxation, massage, acupressure, TENS, heat, and cold application Reviewed with patient prescribed pharmacological and nonpharmacological pain relief strategies During my conversation with the patient, she mentioned that she was experiencing joint and hand pain that felt like arthritis. The doctor prescribed Lidocaine patches 5% to help with the pain. However, the patient is unable to afford them as the pharmacy quoted a price of $80. To assist her with the cost, I contacted Rosendo Gros in the pharmacy in the clinic. Additionally, the patient reported that she has not experienced any breathing problems and is taking her medications as prescribed.         Our next appointment is by telephone on 10/20  at 245 pm  Please call the care guide team at [phone] if you need to cancel or reschedule your appointment.   If you are experiencing a Mental Health or [place] or need someone to talk to, please call 1-800-273-TALK (toll free, 24 hour hotline)  The patient verbalized understanding of instructions, educational materials, and care plan provided today.  Lazaro Arms RN, BSN, [place] [facility]   Phone: [phone]

## 2022-07-21 NOTE — Assessment & Plan Note (Signed)
Patient with significant chronic lumbar and right hip pain. History of right hip replacement. Currently too high risk for any further operations so managing her pain conservatively. She has done physical therapy in the past and declines PT today. Recommend she continue to do stretches and can alternate between ice and heat. Prescribed a lidocaine patch to apply to the lower back. Refilled Norco and can increase from 15 pills to 20 pills at next refill. Discussed return precautions.

## 2022-07-22 ENCOUNTER — Telehealth: Payer: Self-pay

## 2022-07-22 NOTE — Telephone Encounter (Signed)
Prior Auth for patients medication LIDOCAINE PATCHES approved by [facility] MEDICARE PART D from 07/22/22 to 10/30/22.   Key: [id number]

## 2022-07-22 NOTE — Telephone Encounter (Signed)
A Prior Authorization was initiated for this patients LIDOCAINE 5% PATCHES through CoverMyMeds.   Key: [id number]

## 2022-08-10 ENCOUNTER — Other Ambulatory Visit: Payer: Self-pay | Admitting: Family Medicine

## 2022-08-10 DIAGNOSIS — I5042 Chronic combined systolic (congestive) and diastolic (congestive) heart failure: Secondary | ICD-10-CM

## 2022-08-10 DIAGNOSIS — I428 Other cardiomyopathies: Secondary | ICD-10-CM

## 2022-08-15 ENCOUNTER — Other Ambulatory Visit: Payer: Self-pay | Admitting: *Deleted

## 2022-08-15 MED ORDER — GABAPENTIN 300 MG PO CAPS: ORAL_CAPSULE | ORAL | 0 refills | Status: DC

## 2022-08-16 ENCOUNTER — Ambulatory Visit: Payer: Self-pay

## 2022-08-16 NOTE — Patient Instructions (Signed)
Visit Information  Thank you for taking time to visit with me today. Please don't hesitate to contact me if I can be of assistance to you.   Following are the goals we discussed today:   Goals Addressed             This Visit's Progress    COMPLETED: I want to be able to manage my symptoms       Care Coordination Interventions: Active listening / Reflection utilized  Problem [place] strategies reviewed Discussed the importance of adequate rest and management of fatigue with COPD Medications reviewed Discussed use of relaxation techniques and/or diversional activities to assist with pain reduction (distraction, imagery, relaxation, massage, acupressure, TENS, heat, and cold application Reviewed with patient prescribed pharmacological and nonpharmacological pain relief strategies Today, I had a conversation with Mrs. Elon Spanner and she seemed to be doing fair. Although she still experiences some aches in her hands, she believes it's due to arthritis. To alleviate the pain, she will use rubs, but nothing more. On a positive note, her breathing is doing well. I also reminded her that the season is changing and there have been cases of COVID, so it's important to remember the protocols when going out. We concluded that this will be my last call with her. She has a good understanding of what she needs to do for her health and has a supportive family system.     If you are experiencing a Mental Health or [place] or need someone to talk to, please call 1-800-273-TALK (toll free, 24 hour hotline)  The patient verbalized understanding of instructions, educational materials, and care plan provided today and agreed to receive a mailed copy of patient instructions, educational materials, and care plan.   No follow up Required:  Lazaro Arms RN, BSN, [place] [facility]   Phone: [phone]

## 2022-08-16 NOTE — Patient Outreach (Signed)
  Care Coordination   Follow Up Visit Note   08/16/2022 Name: Veronnica Hennings MRN: [id number] DOB: 28-Jun-1946  Almas Rake is a 76 y.o. year old female who sees [name], DO for primary care. I spoke with  [name] by phone today.  What matters to the patients health and wellness today?  I still have some arthritis    Goals Addressed             This Visit's Progress    COMPLETED: I want to be able to manage my symptoms       Care Coordination Interventions: Active listening / Reflection utilized  Problem [place] strategies reviewed Discussed the importance of adequate rest and management of fatigue with COPD Medications reviewed Discussed use of relaxation techniques and/or diversional activities to assist with pain reduction (distraction, imagery, relaxation, massage, acupressure, TENS, heat, and cold application Reviewed with patient prescribed pharmacological and nonpharmacological pain relief strategies Today, I had a conversation with Mrs. Elon Spanner and she seemed to be doing fair. Although she still experiences some aches in her hands, she believes it's due to arthritis. To alleviate the pain, she will use rubs, but nothing more. On a positive note, her breathing is doing well. I also reminded her that the season is changing and there have been cases of COVID, so it's important to remember the protocols when going out. We concluded that this will be my last call with her. She has a good understanding of what she needs to do for her health and has a supportive family system.        SDOH assessments and interventions completed:  No     Care Coordination Interventions Activated:  Yes  Care Coordination Interventions:  Yes, provided   Follow up plan: No further intervention required.   Encounter Outcome:  Pt. Visit Completed   Lazaro Arms RN, BSN, [facility] [place] [facility]   Phone: [phone]

## 2022-08-31 ENCOUNTER — Other Ambulatory Visit: Payer: Self-pay

## 2022-08-31 DIAGNOSIS — M169 Osteoarthritis of hip, unspecified: Secondary | ICD-10-CM

## 2022-08-31 MED ORDER — HYDROCODONE-ACETAMINOPHEN 5-325 MG PO TABS: 1.0000 | ORAL_TABLET | Freq: Four times a day (QID) | ORAL | 0 refills | Status: DC | PRN

## 2022-09-02 NOTE — Progress Notes (Unsigned)
Cardiology Office Note   Date:  09/06/2022   ID:  Donna Herrera, DOB [birth year], MRN [id number]  PCP:  [name], DO  Cardiologist:   Dorris Carnes, MD   F/U of CHF     History of Present Illness: Donna Herrera is a 76 y.o. female with a history of systolic CHF   LVEF in 15 to 20% (Cath in 2017 normal)   Repeat in 2019 LVEF 40 to 12  Pt also has a hx of HTN, HL and tob use    I saw the pt in clinic in May 2023  Since she she says her breathing is OK  No CP  Goes to bed late   Wakes up late   Admites to eating a lot of sugary things     Current Meds  Medication Sig   acetaminophen (TYLENOL) 500 MG tablet Take 1,000 mg by mouth daily as needed for mild pain or moderate pain.   albuterol (VENTOLIN HFA) 108 (90 Base) MCG/ACT inhaler INHALE 1 TO 2 PUFFS BY MOUTH EVERY 6 HOURS AS NEEDED FOR WHEEZE   allopurinol (ZYLOPRIM) 100 MG tablet Take 1 tablet (100 mg total) by mouth daily.   aspirin 81 MG tablet Take 1 tablet (81 mg total) by mouth daily.   atorvastatin (LIPITOR) 40 MG tablet Take 40 mg by mouth daily.   baclofen (LIORESAL) 10 MG tablet TAKE 1 TABLET (10 MG TOTAL) BY MOUTH 3 (THREE) TIMES DAILY. WILL REFILL FOR 2 MONTH SUPPLY, PLEASE SCHEDULE APPOINTMENT WITH PCP FOR FOLLOW-UP FOR FUTURE REFILLS.   Blood Glucose Monitoring Suppl (ONETOUCH VERIO) w/Device KIT Please use to check blood sugar once daily. E11.9   carvedilol (COREG) 3.125 MG tablet TAKE 1 TABLET BY MOUTH TWICE A DAY   cetirizine (ZYRTEC) 10 MG tablet Take 10 mg by mouth daily as needed for allergies.   Cholecalciferol (VITAMIN D3) 125 MCG (5000 UT) CAPS Take 1 capsule (5,000 Units total) by mouth daily.   colchicine 0.6 MG tablet TAKE 1 TABLET BY MOUTH EVERY DAY   diclofenac Sodium (VOLTAREN) 1 % GEL Apply 4 g topically 4 (four) times daily.   DULoxetine (CYMBALTA) 30 MG capsule TAKE 1 CAPSULE(30 MG) BY MOUTH DAILY   famotidine (PEPCID) 20 MG tablet TAKE 1 TABLET (20 MG TOTAL) BY MOUTH  DAILY AS NEEDED FOR HEARTBURN OR INDIGESTION.   furosemide (LASIX) 20 MG tablet TAKE 20 MG TABLET BY MOUTH ON Monday, WED, FRI   gabapentin (NEURONTIN) 300 MG capsule TAKE 1 CAPSULE BY MOUTH EVERYDAY AT BEDTIME   hydrALAZINE (APRESOLINE) 50 MG tablet TAKE 1 TABLET BY MOUTH THREE TIMES A DAY   HYDROcodone-acetaminophen (NORCO) 5-325 MG tablet Take 1 tablet by mouth every 6 (six) hours as needed for moderate pain.   Lancet Devices (ONE TOUCH DELICA LANCING DEV) MISC Please use to check blood sugar once daily. E11.9   lidocaine (LIDODERM) 5 % Place 1 patch onto the skin daily. Remove & Discard patch within 12 hours or as directed by MD   Multiple Vitamin (MULTIVITAMIN WITH MINERALS) TABS tablet Take 1 tablet by mouth daily.   OneTouch Delica Lancets 41Y MISC Please use to check blood sugar once daily. E11.9   ONETOUCH VERIO test strip PLEASE USE TO CHECK BLOOD SUGAR ONCE DAILY. E11.9   Potassium Chloride ER 20 MEQ TBCR TAKE 1 TABLET BY MOUTH AS NEEDED (WHEN TAKING LASIX).   rosuvastatin (CRESTOR) 20 MG tablet TAKE 1 TABLET BY MOUTH EVERY DAY   spironolactone (ALDACTONE) 25  MG tablet TAKE 1/2 TABLET BY MOUTH EVERY DAY   [DISCONTINUED] benzonatate (TESSALON) 100 MG capsule as needed.   [DISCONTINUED] calcium carbonate (TUMS - DOSED IN MG ELEMENTAL CALCIUM) 500 MG chewable tablet Chew 1 tablet by mouth daily as needed for indigestion or heartburn.   [DISCONTINUED] chlorhexidine gluconate, MEDLINE KIT, (PERIDEX) 0.12 % solution Use as directed 15 mLs in the mouth or throat 2 (two) times daily.   [DISCONTINUED] fluticasone (FLONASE) 50 MCG/ACT nasal spray Place 2 sprays into both nostrils daily as needed for allergies or rhinitis.   [DISCONTINUED] fluticasone (FLONASE) 50 MCG/ACT nasal spray Place 2 sprays into both nostrils daily.   [DISCONTINUED] triamcinolone ointment (KENALOG) 0.5 % Apply 1 application topically 2 (two) times daily.     Allergies:   Crab [shellfish allergy], Morphine and related,  and Atorvastatin   Past Medical History:  Diagnosis Date   Adenomatous polyp 03/23/2020   Colonoscopy 2021 notable for adenomatous polyp. Per GI, no further follow up colonoscopies recommended given patient's age.   Allergy    Arthritis    Atherosclerosis of aorta ([place]) 07/15/2021   Candidiasis, mouth 10/19/2017   Chronic combined systolic and diastolic CHF (congestive heart failure) ([place]) 10/21/2015   Echo 3/16: EF 40%, diffuse HK, mild MR, moderate LAE, mild RAE, PASP 42 mmHg  //  Echo 12/16: Mild LVH, EF 35-40%, diffuse HK, Gr 2 DD, MAC, mild MR, mod LAE, normal RVSF, PASP 49 mmHg // Echo 5/19: EF 15-20, diff HK, Gr 2 DD, trivial AI, MAC, trivial MR, mod LAE, normal RVSF, mild TR, PASP 62 // Limited Echo 8/19: mod LVH, EF 40-45, diff HK, Gr 1 DD, MAC, mild LAE    Chronic left shoulder pain 04/10/2013   Chronic lung disease    Chronic pain    Chronic pain syndrome 07/20/2017   Confusion 06/08/2021   Depression 08/25/2016   DJD (degenerative joint disease) 10/12/2012   Multiple joint replacements by Dr. Collier Salina.    Dyshidrotic eczema 12/31/2014   Edentulous 07/19/2021   Emphysema lung ([place]) 03/02/2021   Long smoking history.  Noted on CT Low Dose Chest: 01/2021: IMPRESSION: Chronic interstitial changes raise the question of pulmonary fibrosis. High-resolution chest CT may prove helpful to further evaluate as clinically warranted.Emphysema (ICD10-J43.9) and Aortic Atherosclerosis (ICD10-170.0)  Recommended pulmonology referral but patient declined. Recommend continued discussion and symptom monitor   Essential hypertension 07/20/2017   GERD (gastroesophageal reflux disease)    Gout    H/O slipped capital femoral epiphysis (SCFE)    Heart failure, left, with LVEF 41-49% ([place]) 10/21/2015   transthoracic echocardiogram 06/06/18: EF appears improved to 40-45% with diffuse hypokinesis  A. Echo 3/16: EF 40%, diffuse HK, mild MR, moderate LAE, mild RAE, PASP 42 mmHg  //  B. Echo 12/16: Mild LVH, EF 35-40%,  diffuse HK, grade 2 diastolic dysfunction, MAC, mild MR, moderate LAE, normal RVSF, PASP 49 mmHg   History of total right hip replacement 04/02/2019   Followed closely with orthopedist Dr. Alvan Dame. Patient is s/p total right hip replacement in 1980's with evidence of bone loss and osteopenia making reconstruction unlikely. Plan for conservative treatment at this time with crutches for ambulation and pain control.   Hyperlipidemia    Hypertension    Hypertensive heart disease with CHF (congestive heart failure) ([place]) 10/12/2012   Loss of taste 02/06/2018   Mass of breast, left 07/21/2014   NICM (nonischemic cardiomyopathy) ([place]) 06/09/2016   A. LHC 7/17: Normal coronary arteries, EF 30-35%, LVEDP 14 mmHg  Pain in gums 04/18/2018   Pre-diabetes    Pulmonary fibrosis ([place]) 03/02/2021   CT Low Dose Chest: 01/2021: IMPRESSION: Chronic interstitial changes raise the question of pulmonary fibrosis. High-resolution chest CT may prove helpful to further evaluate as clinically warranted.Emphysema (ICD10-J43.9) and Aortic Atherosclerosis (ICD10-170.0)  Recommended pulmonology referral but patient declined. Recommend continued discussion and symptom monitoring.   Pulmonary nodule 1 cm or greater in diameter 03/02/2021   CT low dose Chest: 01/2021: 1. Lung-RADS 4A, suspicious. 10 mm posterior left lower lobe pulmonary nodule, possibly scarring. Follow up low-dose chest CT without contrast in 3 months (please use the following order, "CT CHEST LCS NODULE FOLLOW-UP W/O CM") is recommended. Alternatively, PET may be considered when there is a solid component 33m or larger.  Plan: Follow up low dose CT ordered in 3 mon   Rib pain on left side 09/17/2020   Right hip pain 11/02/2012   Tendonitis, Achilles, left 05/24/2017   Tobacco abuse 09/29/2014   Urge incontinence 07/19/2021   Vitamin D deficiency 12/31/2014    Past Surgical History:  Procedure Laterality Date   CARDIAC CATHETERIZATION  2005   normal-[place]   CARDIAC  CATHETERIZATION N/A 05/11/2016   Procedure: Right/Left Heart Cath and Coronary Angiography;  Surgeon: Peter M JMartinique MD;  [facility];  Service: Cardiovascular;  Laterality: N/A;   CESAREAN SECTION     COLONOSCOPY  1990's ??   HIP FUSION Left    left    JOINT REPLACEMENT Bilateral 1991, 1997   [place]   ROTATOR CUFF REPAIR     bilateral   TOOTH EXTRACTION N/A 04/03/2020   Procedure: DENTAL RESTORATION/EXTRACTIONS;  Surgeon: JDiona Browner DDS;  [facility]  Service: Oral Surgery;  Laterality: N/A;   TOTAL HIP ARTHROPLASTY  1987   right. Dr. ACollier Salina  TUBAL LIGATION       Social History:  The patient  reports that she quit smoking about 6 years ago. Her smoking use included cigarettes. She has a 27.50 pack-year smoking history. She has never used smokeless tobacco. She reports that she does not currently use alcohol. She reports that she does not use drugs.   Family History:  The patient's family history includes Cancer in her father; Depression in her mother; Diabetes in her mother and sister; Heart disease in her mother; Hypertension in her mother; Stomach cancer in her brother; Stroke in her mother; Throat cancer in her son.    ROS:  Please see the history of present illness. All other systems are reviewed and  Negative to the above problem except as noted.    PHYSICAL EXAM: VS:  BP 110/80   Pulse 66   Ht _0  (1.676 m)   Wt 167 lb 9.6 oz (76 kg)   SpO2 97%   BMI 27.05 kg/m   GEN: Overweight  76yo in no acute distress  HEENT: normal  Neck: no JVD   Cardiac: RRR; no murmurs  Tr  LE  edema  Respiratory:  clear to auscultation bilaterally GI: soft, nontender, nondistended, + BS  MS: no deformity Moving all extremities   Skin: warm and dry, no rash Neuro: Deferred   EKG:  EKG is not ordered today.    Prior CV Studies: Echo 06/06/18 Mod LVH, EF 40-45, diff HK, Gr 1 DD, MAC, trivial MR, mild LAE, trivial TR   Echo 03/06/2018 EF 15-20, diffuse HK,  grade 2 diastolic dysfunction, trivial AI, MAC, trivial MR, moderate LAE, normal RVSF, mild  TR, PASP 62   LHC 05/11/16 1. Normal coronary anatomy 2. Severe LV dysfunction with EF 30-35%. 3. Normal right heart and LV filling pressures. Lipid Panel    Component Value Date/Time   CHOL 148 09/15/2021 1014   TRIG 67 09/15/2021 1014   HDL 61 09/15/2021 1014   CHOLHDL 2.4 09/15/2021 1014   CHOLHDL 3.5 10/04/2015 0141   VLDL 16 10/04/2015 0141   LDLCALC 74 09/15/2021 1014   LDLDIRECT 174 (H) 06/08/2021 1215      Wt Readings from Last 3 Encounters:  09/06/22 167 lb 9.6 oz (76 kg)  07/19/22 176 lb 9.6 oz (80.1 kg)  05/28/22 180 lb (81.6 kg)      ASSESSMENT AND PLAN:  1 Hx nonischemic cardiomyopathy  LVEF mod depressed on last echo  in 2019  (LVEF 40 to 45%)   Volume status is OK She did not tolerate Entresto in the past   Weak  Now on carvedilol, spironolactone and hydralazine     Will review labs   consider Jardiance        2  HTN   Keep on current meds   BP is controlled    3  HL   Continue Crestor   4  DM   CHeck A1C   Discussed diet  N  5  Sleep   Reviewed sleep strategies  Turn off screens    Plan for f/u next summer   Current medicines are reviewed at length with the patient today.  The patient does not have concerns regarding medicines.  Signed, Dorris Carnes, MD  09/06/2022 7:27 PM    [address], [place], [place]  [address] Phone: [phone]; Fax: [phone]

## 2022-09-06 ENCOUNTER — Ambulatory Visit: Payer: Medicare Other | Attending: Internal Medicine | Admitting: Internal Medicine

## 2022-09-06 ENCOUNTER — Encounter: Payer: Self-pay | Admitting: Internal Medicine

## 2022-09-06 VITALS — BP 110/80 | HR 66 | Ht 66.0 in | Wt 167.6 lb

## 2022-09-06 DIAGNOSIS — I10 Essential (primary) hypertension: Secondary | ICD-10-CM

## 2022-09-06 DIAGNOSIS — I5042 Chronic combined systolic (congestive) and diastolic (congestive) heart failure: Secondary | ICD-10-CM

## 2022-09-06 DIAGNOSIS — Z79899 Other long term (current) drug therapy: Secondary | ICD-10-CM | POA: Diagnosis not present

## 2022-09-06 NOTE — Patient Instructions (Signed)
Medication Instructions:   *If you need a refill on your cardiac medications before your next appointment, please call your pharmacy*   Lab Work: NMR, HGBA1C, BMET If you have labs (blood work) drawn today and your tests are completely normal, you will receive your results only by: [place] (if you have MyChart) OR A paper copy in the mail If you have any lab test that is abnormal or we need to change your treatment, we will call you to review the results.   Testing/Procedures:    Follow-Up: At [facility], you and your health needs are our priority.  As part of our continuing mission to provide you with exceptional heart care, we have created designated Provider Care Teams.  These Care Teams include your primary Cardiologist (physician) and Advanced Practice Providers (APPs -  Physician Assistants and Nurse Practitioners) who all work together to provide you with the care you need, when you need it.  We recommend signing up for the patient portal called "MyChart".  Sign up information is provided on this After Visit Summary.  MyChart is used to connect with patients for Virtual Visits (Telemedicine).  Patients are able to view lab/test results, encounter notes, upcoming appointments, etc.  Non-urgent messages can be sent to your provider as well.   To learn more about what you can do with MyChart, go to [email or web address].    Your next appointment:   6 month(s)  The format for your next appointment:   In Person  Provider:   Dorris Carnes, MD     Other Instructions   Important Information About Sugar

## 2022-09-07 LAB — NMR, LIPOPROFILE
Cholesterol, Total: 201 mg/dL — ABNORMAL HIGH (ref 100–199)
HDL Particle Number: 42 umol/L (ref >=30.5)
HDL-C: 77 mg/dL (ref >39)
LDL Particle Number: 1357 nmol/L — ABNORMAL HIGH (ref <1000)
LDL Size: 21.1 nm (ref >20.5)
LDL-C (NIH Calc): 106 mg/dL — ABNORMAL HIGH (ref 0–99)
LP-IR Score: 33 (ref <=45)
Small LDL Particle Number: 523 nmol/L (ref <=527)
Triglycerides: 101 mg/dL (ref 0–149)

## 2022-09-07 LAB — BASIC METABOLIC PANEL
BUN/Creatinine Ratio: 21 (ref 12–28)
BUN: 21 mg/dL (ref 8–27)
CO2: 20 mmol/L (ref 20–29)
Calcium: 9.9 mg/dL (ref 8.7–10.3)
Chloride: 101 mmol/L (ref 96–106)
Creatinine, Ser: 1.02 mg/dL — ABNORMAL HIGH (ref 0.57–1.00)
Glucose: 108 mg/dL — ABNORMAL HIGH (ref 70–99)
Potassium: 4.7 mmol/L (ref 3.5–5.2)
Sodium: 139 mmol/L (ref 134–144)
eGFR: 57 mL/min/{1.73_m2} — ABNORMAL LOW (ref >59)

## 2022-09-07 LAB — HEMOGLOBIN A1C
Est. average glucose Bld gHb Est-mCnc: 131 mg/dL
Hgb A1c MFr Bld: 6.2 % — ABNORMAL HIGH (ref 4.8–5.6)

## 2022-09-12 ENCOUNTER — Other Ambulatory Visit: Payer: Self-pay | Admitting: Family Medicine

## 2022-10-03 ENCOUNTER — Telehealth: Payer: Self-pay

## 2022-10-03 DIAGNOSIS — I700 Atherosclerosis of aorta: Secondary | ICD-10-CM

## 2022-10-03 DIAGNOSIS — M169 Osteoarthritis of hip, unspecified: Secondary | ICD-10-CM

## 2022-10-03 DIAGNOSIS — Z79899 Other long term (current) drug therapy: Secondary | ICD-10-CM

## 2022-10-03 MED ORDER — ROSUVASTATIN CALCIUM 20 MG PO TABS: 20.0000 mg | ORAL_TABLET | ORAL | 3 refills | Status: DC

## 2022-10-03 NOTE — Telephone Encounter (Signed)
Patient calls nurse line regarding cough, congestion and headache.   Productive cough with greenish/yellow mucus, chest sore from coughing. Symptoms have been going on for the last four days. She also reports sore throat, felt like she had a fever. Has not checked temperature with thermometer. No known exposures to COVID or the flu.   Denies body aches. She is asking if Dr. Arby Barrette can send in medication for symptoms. Advised of OTC medications and supportive measures. Informed patient that she would need to schedule appointment for additional prescriptions for cough.   Patient is requesting prescription for pain medication as well.   She will call back tomorrow if she is able to get a ride to schedule an appointment.   Forwarding to PCP.   Talbot Grumbling, RN

## 2022-10-03 NOTE — Telephone Encounter (Signed)
Would recomm adding Zetia   Zetia leads to decreased cholesterol absorption Follow up lipomed panel and liver panel in 8 wks

## 2022-10-03 NOTE — Telephone Encounter (Signed)
I spoke with the pt after several attempts and she confirms that she is taking Rosuvastatin 20 mg three times a week only due to h/o muscle cramps with statin meds. She is not also taking the Atorvastatin. (Med list updated)   I will forward to Dr Harrington Challenger for review.

## 2022-10-03 NOTE — Telephone Encounter (Signed)
-----   Message from Fay Records, MD sent at 09/12/2022 10:17 AM EST ----- Electrolytes are OK     Cr is better at 1.02 Hgb A1C is higher   6.2   Cut back on carbs, cut out sugar  Lipis are fair   LDL 102   She has no CAD by cath in 2017   Trying to prevent disease, that is why she is on statin.     Which statin is she taking now   ? Lipitor vis Crestor

## 2022-10-05 MED ORDER — EZETIMIBE 10 MG PO TABS: 10.0000 mg | ORAL_TABLET | Freq: Every day | ORAL | 3 refills | Status: DC

## 2022-10-05 NOTE — Telephone Encounter (Signed)
Spoke with the patient and she is willing to start zetia 10 mg daily. Prescription has been sent in. Lab work has been scheduled.

## 2022-10-07 ENCOUNTER — Encounter: Payer: Self-pay | Admitting: Family Medicine

## 2022-10-07 ENCOUNTER — Ambulatory Visit (INDEPENDENT_AMBULATORY_CARE_PROVIDER_SITE_OTHER): Payer: Medicare Other | Admitting: Family Medicine

## 2022-10-07 VITALS — BP 152/79 | HR 71 | Ht 66.0 in | Wt 172.4 lb

## 2022-10-07 DIAGNOSIS — J069 Acute upper respiratory infection, unspecified: Secondary | ICD-10-CM | POA: Diagnosis not present

## 2022-10-07 MED ORDER — BENZONATATE 100 MG PO CAPS: 100.0000 mg | ORAL_CAPSULE | Freq: Two times a day (BID) | ORAL | 0 refills | Status: DC | PRN

## 2022-10-07 NOTE — Assessment & Plan Note (Addendum)
2 wk hx of wet cough productive for brown mucus (different than baseline COPD cough), congestion, runny nose, headache, body aches. Mild SOB once, but improved with albutertol. No recorded fevers. No sick contact or travel. Afebrile, euvolemic on exam. Differential includes viral URI, COPD exacc/PNA, CHF exacc. Viral URI is most likely and pt's prolonged symptoms may be due to her underlying COPD. COPD and PNA are less likely given normal lung exam. CHF is less likely given euvolemic exam and stable weight. - Cont Tylenol for fever, pain - Start Tessalon perls for cough - f/u flu/covid swab

## 2022-10-07 NOTE — Telephone Encounter (Signed)
Patient returns call to nurse line. She was seen in the office by Dr. Markus Jarvis, however, did not receive refill on pain medication.   Will forward request to PCP.   Talbot Grumbling, RN

## 2022-10-07 NOTE — Progress Notes (Signed)
    SUBJECTIVE:   CHIEF COMPLAINT / HPI:   PL is a 76 year old F here for cough.  Starting 2 wks ago, she started having wet cough, brown mucus (this is different than her baseline cough), congestion, runny nose, sneezing. No sick contact or travel. No N/V, diarrhea. Mild SOB a few nights ago, improved with albuterol. She is bothered by the cough, which causes chest ache, headache, and her chronic msk pain to act up.   Lives with daughter and son in law. Daughter also having cough last week.   PERTINENT  PMH / PSH: COPD  OBJECTIVE:   BP (!) 152/79 Comment: sitting on the bed  Pulse 71   Ht '5\' 6"'$  (1.676 m)   Wt 172 lb 6 oz (78.2 kg)   SpO2 98%   BMI 27.82 kg/m   Gen: uncomfortable appearing, alert, sitting on exam table. NAD. HEENT: NCAT. MMM.  Oropharynx clear.  Neck supple and no LAD. Resp: CTAB, no crackles or wheezing.  Normal work of breathing on room air. CV: RRR Ext: No pitting edema  ASSESSMENT/PLAN:   Viral URI with cough 2 wk hx of wet cough productive for brown mucus (different than baseline COPD cough), congestion, runny nose, headache, body aches. Mild SOB once, but improved with albutertol. No recorded fevers. No sick contact or travel. Afebrile, euvolemic on exam. Differential includes viral URI, COPD exacc/PNA, CHF exacc. Viral URI is most likely and pt's prolonged symptoms may be due to her underlying COPD. COPD and PNA are less likely given normal lung exam. CHF is less likely given euvolemic exam and stable weight. - Cont Tylenol for fever, pain - Start Tessalon perls for cough - f/u flu/covid swab   Arlyce Dice, MD [place]

## 2022-10-07 NOTE — Patient Instructions (Addendum)
Good to see you today - Thank you for coming in  Things we discussed today:  1) Your symptoms are due to a viral upper respiratory infection. These resolve on their own, but the cough can linger 6-8 weeks after the infection is dead and gone. - Continue taking Tylenol '1000mg'$  (2 tablets) every 6 hours for 7 days - Take Tessalon Perls as needed for cough. We do not want to completely  get rid of cough because it helps clear junk out of your lungs.

## 2022-10-10 ENCOUNTER — Other Ambulatory Visit: Payer: Self-pay | Admitting: Family Medicine

## 2022-10-10 LAB — COVID-19, FLU A+B NAA
Influenza A, NAA: NOT DETECTED
Influenza B, NAA: NOT DETECTED
SARS-CoV-2, NAA: NOT DETECTED

## 2022-10-10 MED ORDER — HYDROCODONE-ACETAMINOPHEN 5-325 MG PO TABS: 1.0000 | ORAL_TABLET | Freq: Four times a day (QID) | ORAL | 0 refills | Status: DC | PRN

## 2022-10-12 ENCOUNTER — Other Ambulatory Visit: Payer: Self-pay | Admitting: Family Medicine

## 2022-10-21 ENCOUNTER — Telehealth: Payer: Self-pay

## 2022-10-21 NOTE — Telephone Encounter (Signed)
Patient calls nurse line regarding red spot on left big toe. Patient states that this has been going on since last Thursday. She says that the red area started bleeding the other day. Denies pus like fluid, malodorous drainage, fever or chills. She reports pain at site. She has been cleaning area with hydrogen peroxide and applying neosporin and gauze.   Advised patient to stop the use of peroxide and neosporin and keep area clean with vaseline and dressing. She can have area open to air in the evening.   Offered to schedule patient appointment, however, she only wants to see Dr. Arby Barrette. Scheduled with PCP on 11/03/22.  Provided with return precautions.   Talbot Grumbling, RN

## 2022-11-03 ENCOUNTER — Ambulatory Visit: Payer: Medicare Other | Admitting: Family Medicine

## 2022-11-07 ENCOUNTER — Other Ambulatory Visit: Payer: Self-pay

## 2022-11-07 DIAGNOSIS — M169 Osteoarthritis of hip, unspecified: Secondary | ICD-10-CM

## 2022-11-08 MED ORDER — HYDROCODONE-ACETAMINOPHEN 5-325 MG PO TABS: 1.0000 | ORAL_TABLET | Freq: Four times a day (QID) | ORAL | 0 refills | Status: DC | PRN

## 2022-11-11 ENCOUNTER — Other Ambulatory Visit: Payer: Self-pay | Admitting: Family Medicine

## 2022-11-25 ENCOUNTER — Other Ambulatory Visit: Payer: Self-pay | Admitting: Family Medicine

## 2022-11-25 DIAGNOSIS — M169 Osteoarthritis of hip, unspecified: Secondary | ICD-10-CM

## 2022-11-26 DIAGNOSIS — J441 Chronic obstructive pulmonary disease with (acute) exacerbation: Secondary | ICD-10-CM | POA: Diagnosis not present

## 2022-11-26 DIAGNOSIS — R051 Acute cough: Secondary | ICD-10-CM | POA: Diagnosis not present

## 2022-11-26 DIAGNOSIS — J069 Acute upper respiratory infection, unspecified: Secondary | ICD-10-CM | POA: Diagnosis not present

## 2022-11-26 DIAGNOSIS — R0602 Shortness of breath: Secondary | ICD-10-CM | POA: Diagnosis not present

## 2022-11-26 MED ORDER — HYDROCODONE-ACETAMINOPHEN 5-325 MG PO TABS
1.0000 | ORAL_TABLET | Freq: Four times a day (QID) | ORAL | 0 refills | Status: DC | PRN
Start: 2022-11-26 — End: 2022-12-06

## 2022-11-28 DIAGNOSIS — M25551 Pain in right hip: Secondary | ICD-10-CM | POA: Diagnosis not present

## 2022-12-04 ENCOUNTER — Other Ambulatory Visit: Payer: Self-pay | Admitting: Family Medicine

## 2022-12-05 ENCOUNTER — Other Ambulatory Visit: Payer: Medicare Other

## 2022-12-06 ENCOUNTER — Ambulatory Visit: Payer: Medicare Other | Admitting: Family Medicine

## 2022-12-06 ENCOUNTER — Other Ambulatory Visit: Payer: Self-pay

## 2022-12-06 DIAGNOSIS — M169 Osteoarthritis of hip, unspecified: Secondary | ICD-10-CM

## 2022-12-06 NOTE — Telephone Encounter (Signed)
Granddaughter calls nurse line requesting a refill on Hydrocodone.   She reports she had to cancel the apt today and the apt with Cardiologist yesterday due to uncontrolled pain.   Advised to make a FU apt with PCP as soon as possible.   Will forward to PCP for refill.

## 2022-12-07 MED ORDER — HYDROCODONE-ACETAMINOPHEN 5-325 MG PO TABS: 1.0000 | ORAL_TABLET | Freq: Four times a day (QID) | ORAL | 0 refills | Status: DC | PRN

## 2022-12-12 ENCOUNTER — Other Ambulatory Visit: Payer: Medicare Other

## 2022-12-13 ENCOUNTER — Ambulatory Visit: Payer: Medicare Other | Attending: Internal Medicine

## 2022-12-13 DIAGNOSIS — Z79899 Other long term (current) drug therapy: Secondary | ICD-10-CM

## 2022-12-13 DIAGNOSIS — I700 Atherosclerosis of aorta: Secondary | ICD-10-CM

## 2022-12-14 LAB — HEPATIC FUNCTION PANEL
ALT: 11 IU/L (ref 0–32)
AST: 17 IU/L (ref 0–40)
Albumin: 4.4 g/dL (ref 3.8–4.8)
Alkaline Phosphatase: 106 IU/L (ref 44–121)
Bilirubin Total: 0.4 mg/dL (ref 0.0–1.2)
Bilirubin, Direct: 0.12 mg/dL (ref 0.00–0.40)
Total Protein: 7.3 g/dL (ref 6.0–8.5)

## 2022-12-14 LAB — NMR, LIPOPROFILE
Cholesterol, Total: 241 mg/dL — ABNORMAL HIGH (ref 100–199)
HDL Particle Number: 39.8 umol/L (ref >=30.5)
HDL-C: 80 mg/dL (ref >39)
LDL Particle Number: 1471 nmol/L — ABNORMAL HIGH (ref <1000)
LDL Size: 21.4 nm (ref >20.5)
LDL-C (NIH Calc): 139 mg/dL — ABNORMAL HIGH (ref 0–99)
LP-IR Score: 33 (ref <=45)
Small LDL Particle Number: 424 nmol/L (ref <=527)
Triglycerides: 129 mg/dL (ref 0–149)

## 2022-12-19 ENCOUNTER — Telehealth: Payer: Self-pay

## 2022-12-19 DIAGNOSIS — E7849 Other hyperlipidemia: Secondary | ICD-10-CM

## 2022-12-19 DIAGNOSIS — Z79899 Other long term (current) drug therapy: Secondary | ICD-10-CM

## 2022-12-19 NOTE — Telephone Encounter (Signed)
-----   Message from Fay Records, MD sent at 12/15/2022 10:18 PM EST ----- LIPids are worse than last check inNov 2023    Please confirm what he is taking

## 2022-12-19 NOTE — Telephone Encounter (Signed)
Spoke with the pt re: her lab results and she reports that she has been taking the Zetia but has forgot to take the Rosuvastatin 20 mg for several weeks if not longer... I asked her to restart taking them.. she says she has plenty at home no need to send in to her pharmacy.. I will forward to Dr Harrington Challenger for review.

## 2022-12-23 DIAGNOSIS — T84019A Broken internal joint prosthesis, unspecified site, initial encounter: Secondary | ICD-10-CM | POA: Diagnosis not present

## 2022-12-23 DIAGNOSIS — T84090D Other mechanical complication of internal right hip prosthesis, subsequent encounter: Secondary | ICD-10-CM | POA: Diagnosis not present

## 2022-12-23 DIAGNOSIS — Z981 Arthrodesis status: Secondary | ICD-10-CM | POA: Insufficient documentation

## 2022-12-23 DIAGNOSIS — Z8781 Personal history of (healed) traumatic fracture: Secondary | ICD-10-CM | POA: Diagnosis not present

## 2022-12-23 DIAGNOSIS — M25551 Pain in right hip: Secondary | ICD-10-CM | POA: Diagnosis not present

## 2022-12-23 HISTORY — DX: Broken internal joint prosthesis, unspecified site, initial encounter: T84.019A

## 2022-12-23 HISTORY — DX: Arthrodesis status: Z98.1

## 2022-12-29 NOTE — Addendum Note (Signed)
Addended by: Stephani Police on: 12/29/2022 10:41 AM   Modules accepted: Orders

## 2022-12-29 NOTE — Telephone Encounter (Signed)
Pt to have labs 02/22/23.

## 2022-12-30 ENCOUNTER — Other Ambulatory Visit: Payer: Self-pay | Admitting: Family Medicine

## 2023-01-04 ENCOUNTER — Encounter: Payer: Self-pay | Admitting: Family Medicine

## 2023-01-04 ENCOUNTER — Ambulatory Visit (INDEPENDENT_AMBULATORY_CARE_PROVIDER_SITE_OTHER): Payer: Medicare Other | Admitting: Family Medicine

## 2023-01-04 VITALS — BP 126/75 | HR 73 | Temp 98.4°F | Ht 66.0 in | Wt 175.0 lb

## 2023-01-04 DIAGNOSIS — M25551 Pain in right hip: Secondary | ICD-10-CM

## 2023-01-04 DIAGNOSIS — M169 Osteoarthritis of hip, unspecified: Secondary | ICD-10-CM

## 2023-01-04 MED ORDER — HYDROCODONE-ACETAMINOPHEN 5-325 MG PO TABS
1.0000 | ORAL_TABLET | Freq: Four times a day (QID) | ORAL | 0 refills | Status: DC | PRN
Start: 2023-01-04 — End: 2023-04-25

## 2023-01-04 NOTE — Patient Instructions (Addendum)
It was great seeing you today!  Im sorry you have been experiencing so much pain! I have increased the amount of your pain medication and also sent a referral to the pain management doctor. They should call you in the next couple of weeks but if you dont hear anything just call me at the clinic so I can check on the referral.  I will reach out to our chronic care management team who will call you about the process of getting an aide  You will also get a call about your Medicare annual wellness exam  Feel free to call with any questions or concerns at any time, at [phone].   Take care,  Dr. [name] [facility]

## 2023-01-04 NOTE — Progress Notes (Unsigned)
    SUBJECTIVE:   CHIEF COMPLAINT / HPI:    Patient presents for continued hip pain. Was seen by Ortho recently who performed x ray and she states bone was too worn down and she is too high risk for procedure. Working on getting her an Music therapist hydrocodone-acetaminophen 5-3 25, 15 tablets a month Cymbalta 30 mg daily, gabapentin 300 mg bedtime, baclofen 10 mg 3 times daily. Has also tried voltaren gel and lidocaine patch without much relief. Requests a referral to pain clinic  Requests a home health aid but was told she needed medicaid   PERTINENT  PMH / PSH: Reviewed   OBJECTIVE:   BP 126/75   Pulse 73   Temp 98.4 F (36.9 C)   Ht '5\' 6"'$  (1.676 m)   Wt 175 lb (79.4 kg)   SpO2 98%   BMI 28.25 kg/m    Physical exam General: pleasant, in some pain though NAD Cardiovascular: RRR, no murmurs Lungs: CTAB. Normal WOB Abdomen: soft, non-distended, non-tender Skin: warm, dry. No edema  ASSESSMENT/PLAN:   Hip pain, right Patient with significant chronic lumbar and right hip pain. History of right hip replacement. Was seen recently by Ortho but she is too high risk for any further operations. Currently on hydrocodone-acetaminophen 5-3 25, 15 tablets a month Cymbalta 30 mg daily, gabapentin 300 mg bedtime, baclofen 10 mg 3 times daily. She has tried lidocaine patch and Voltaren gel as well without relief. It was discussed at last follow-up to increase to 20 tablets and we will do that today as well as send a referral to a pain specialist per her request. She also requests an aid for assistance with ADLs at home.  - referral to pain management specialist - already followed by CCM- will ask about assistance with getting her an aid      [name], [place]

## 2023-01-05 NOTE — Assessment & Plan Note (Signed)
Patient with significant chronic lumbar and right hip pain. History of right hip replacement. Was seen recently by Ortho but she is too high risk for any further operations. Currently on hydrocodone-acetaminophen 5-3 25, 15 tablets a month Cymbalta 30 mg daily, gabapentin 300 mg bedtime, baclofen 10 mg 3 times daily. She has tried lidocaine patch and Voltaren gel as well without relief. It was discussed at last follow-up to increase to 20 tablets and we will do that today as well as send a referral to a pain specialist per her request. She also requests an aid for assistance with ADLs at home.  - referral to pain management specialist - already followed by CCM- will ask about assistance with getting her an aid

## 2023-01-12 ENCOUNTER — Telehealth: Payer: Self-pay

## 2023-01-12 NOTE — Telephone Encounter (Signed)
Patient calls nurse line in regards to [facility] paperwork.   She reports she spoke with them and they will be sending over paperwork for PCP to fill out.   I do not see anything in PCP box.   Will forward to PCP.

## 2023-01-16 DIAGNOSIS — E559 Vitamin D deficiency, unspecified: Secondary | ICD-10-CM | POA: Diagnosis not present

## 2023-01-16 DIAGNOSIS — M25551 Pain in right hip: Secondary | ICD-10-CM | POA: Diagnosis not present

## 2023-01-16 DIAGNOSIS — M545 Low back pain, unspecified: Secondary | ICD-10-CM | POA: Diagnosis not present

## 2023-01-16 DIAGNOSIS — Z79899 Other long term (current) drug therapy: Secondary | ICD-10-CM | POA: Diagnosis not present

## 2023-01-16 DIAGNOSIS — Z9181 History of falling: Secondary | ICD-10-CM | POA: Diagnosis not present

## 2023-01-16 DIAGNOSIS — R5383 Other fatigue: Secondary | ICD-10-CM | POA: Diagnosis not present

## 2023-01-16 DIAGNOSIS — Z131 Encounter for screening for diabetes mellitus: Secondary | ICD-10-CM | POA: Diagnosis not present

## 2023-01-16 DIAGNOSIS — R030 Elevated blood-pressure reading, without diagnosis of hypertension: Secondary | ICD-10-CM | POA: Diagnosis not present

## 2023-01-16 DIAGNOSIS — Z0000 Encounter for general adult medical examination without abnormal findings: Secondary | ICD-10-CM | POA: Diagnosis not present

## 2023-01-16 DIAGNOSIS — Z1159 Encounter for screening for other viral diseases: Secondary | ICD-10-CM | POA: Diagnosis not present

## 2023-01-16 DIAGNOSIS — M129 Arthropathy, unspecified: Secondary | ICD-10-CM | POA: Diagnosis not present

## 2023-01-16 DIAGNOSIS — Z780 Asymptomatic menopausal state: Secondary | ICD-10-CM | POA: Diagnosis not present

## 2023-01-19 DIAGNOSIS — Z79899 Other long term (current) drug therapy: Secondary | ICD-10-CM | POA: Diagnosis not present

## 2023-01-20 DIAGNOSIS — M47896 Other spondylosis, lumbar region: Secondary | ICD-10-CM | POA: Diagnosis not present

## 2023-01-20 DIAGNOSIS — Z79899 Other long term (current) drug therapy: Secondary | ICD-10-CM | POA: Diagnosis not present

## 2023-01-20 DIAGNOSIS — E559 Vitamin D deficiency, unspecified: Secondary | ICD-10-CM | POA: Diagnosis not present

## 2023-01-20 DIAGNOSIS — Z9181 History of falling: Secondary | ICD-10-CM | POA: Diagnosis not present

## 2023-01-20 DIAGNOSIS — R7303 Prediabetes: Secondary | ICD-10-CM | POA: Diagnosis not present

## 2023-01-20 DIAGNOSIS — M25551 Pain in right hip: Secondary | ICD-10-CM | POA: Diagnosis not present

## 2023-01-24 ENCOUNTER — Encounter: Payer: Self-pay | Admitting: Family Medicine

## 2023-01-24 ENCOUNTER — Ambulatory Visit (HOSPITAL_COMMUNITY)
Admission: RE | Admit: 2023-01-24 | Discharge: 2023-01-24 | Disposition: A | Discharge status: Home / Self Care | Payer: Medicare Other | Source: Ambulatory Visit | Attending: Family Medicine | Admitting: Family Medicine

## 2023-01-24 ENCOUNTER — Ambulatory Visit (INDEPENDENT_AMBULATORY_CARE_PROVIDER_SITE_OTHER): Payer: Medicare Other | Admitting: Family Medicine

## 2023-01-24 VITALS — BP 100/50 | HR 64 | Ht 66.0 in | Wt 174.0 lb

## 2023-01-24 DIAGNOSIS — R079 Chest pain, unspecified: Secondary | ICD-10-CM | POA: Insufficient documentation

## 2023-01-24 DIAGNOSIS — R262 Difficulty in walking, not elsewhere classified: Secondary | ICD-10-CM | POA: Diagnosis not present

## 2023-01-24 DIAGNOSIS — R0789 Other chest pain: Secondary | ICD-10-CM | POA: Diagnosis not present

## 2023-01-24 DIAGNOSIS — Z79899 Other long term (current) drug therapy: Secondary | ICD-10-CM | POA: Diagnosis not present

## 2023-01-24 NOTE — Progress Notes (Unsigned)
SUBJECTIVE:   CHIEF COMPLAINT / HPI:   Patient presents for wheelchair evaluation.   On arrival she endorses some chest discomfort. Endorses chest cramping this morning which she states she has felt intermittently for years. Non radiating. Feels like it squeezes and lasts for seconds. Does not occur every day. Did take medication today. Follows with cardiology.   Has started following with pain clinic since our last visit. They told her that her sugars were elevated and recommended starting Metformin  Switched pain medication to Oxycontin  Dr. Redmond Pulling with Ortho recommended [facility] for pain.   Lives with daughter and son. Takes care of herself. Gives her own medication, takes care of finances with the help of her grand daughter  Geralynn Ochs most days a week Helps care for her children.    Complaint: The major reason for this visit was to conduct a mobility examination. The patient requires a power mobility device now because: can only ambulate short distances due to pain and weakness Height and weight for this patient are located in the vitals portion of the chart My evaluation of this patient is detailed in the following checklist. This is put in check list format for ease of access to information and standardization. This note pertains only to this patient. 1. This patient meets the following criteria for a power mobility device based on presence of:   a). History and location of pressure sores: no  b )  Ability to shift weight is: minimal   2. MRADLs (Mobility related Activities of Daily Living including specifically things such as inability to stand/walk/ impairing their ability to get to bathroom/ toilet/bathe, get to kitchen to prepare meals / eat, get to bedroom to groom / dress) The MR ADLs  Impaired or impacted in the home by the patient's diagnoses/conditions listed above include : pain limiting ability to ambulate safely. Does perform ADLs  The power mobility device is  necessary to assist this patient with the following MRADLsL ambulating far distances 3. The ASSISTIVE device needed is a power mobilty device. This patient cannot use a cane, awalker, a manual wheelchair or a scooter because: high risk for falls given pain affecting ambulation with far distances. :  4. Without the requested power mobility device  a.) This patient will not be able to accomplish (specific MRADL) at all    b.) this patient will not be able to accomplish the MRADL listed in a reasonable time frame  c.)This patient is placed at a reasonably determined heightened risk of morbidity or mortality secondary to attempts to perform the MRADLs specified without this power mobility device device: 5. . This patient's mobility needs can currently be met by a cane, a walker, a manual wheelchair or a scooter due to pain from arthritis limiting mobility:  6.This patient can safely operate the power mobility device in their home. 7. This patient is willing and motivated to use this requested power mobility device in their home. 8. EXAMINATION: Strength: RUE 5/5 LUE 5/5 RLE 5/5 LLE 5/5 PAIN 10/10 ROM: limited  GAIT: ambulating using a crutch    OBJECTIVE:   BP (!) 100/50   Pulse 64   Ht 5\' 6"  (1.676 m)   Wt 174 lb (78.9 kg)   SpO2 96%   BMI 28.08 kg/m    Physical exam General: well appearing, NAD Cardiovascular: RRR, no murmurs Lungs: CTAB. Normal WOB Abdomen: soft, non-distended Skin: warm, dry. No edema  ASSESSMENT/PLAN:   Chest discomfort Patient presented with some  chest cramping on arrival. EKG performed which should normal sinus rhythm and no concern for ischemic changes. Pain improved when I examined her and states this has been occurring intermittently for a long time. Given history of CHF and last echo 3 years ago, did order for another echo. Follows with  cardiology regularly and recommend discussing this with them as well. Strict ED precautions discussed for continued chest  pain. Discussed signs of MI and stroke to patient and grand daughter   Impaired ambulation Patient requests a wheelchair at the recommendation of her orthopedic doctor.  This is due to severe pain interfering with ability to ambulate safely.  On exam patient has normal upper and lower extremity strength, with reduced range of motion secondary to pain.  Currently ambulates with a crutch. Will send in paperwork to [facility].     [place]

## 2023-01-24 NOTE — Patient Instructions (Addendum)
It was great seeing you today!  Today we had a visit for a wheelchair evaluation.   We also discussed things to look out for such as signs of heart attack or stroke.  I have ordered an echo which is an ultrasound of the heart to evaluate your heart pumping.  We will call you with the time of that appointment.   You should be hearing back from [facility] about next steps.   Please check-out at the front desk before leaving the clinic. I'd like to see you back in about a month, but if you need to be seen earlier than that for any new issues we're happy to fit you in, just give [name] a call!   Feel free to call with any questions or concerns at any time, at [phone].   Take care,  Dr. [name] [facility]

## 2023-01-26 DIAGNOSIS — R262 Difficulty in walking, not elsewhere classified: Secondary | ICD-10-CM | POA: Insufficient documentation

## 2023-01-26 DIAGNOSIS — R0789 Other chest pain: Secondary | ICD-10-CM | POA: Insufficient documentation

## 2023-01-26 NOTE — Assessment & Plan Note (Signed)
Patient requests a wheelchair at the recommendation of her orthopedic doctor.  This is due to severe pain interfering with ability to ambulate safely.  On exam patient has normal upper and lower extremity strength, with reduced range of motion secondary to pain.  Currently ambulates with a crutch. Will send in paperwork to [facility].

## 2023-01-26 NOTE — Assessment & Plan Note (Signed)
Patient presented with some chest cramping on arrival. EKG performed which should normal sinus rhythm and no concern for ischemic changes. Pain improved when I examined her and states this has been occurring intermittently for a long time. Given history of CHF and last echo 3 years ago, did order for another echo. Follows with  cardiology regularly and recommend discussing this with them as well. Strict ED precautions discussed for continued chest pain. Discussed signs of MI and stroke to patient and grand daughter

## 2023-02-08 ENCOUNTER — Other Ambulatory Visit: Payer: Self-pay | Admitting: Family Medicine

## 2023-02-08 DIAGNOSIS — I428 Other cardiomyopathies: Secondary | ICD-10-CM

## 2023-02-08 DIAGNOSIS — I5042 Chronic combined systolic (congestive) and diastolic (congestive) heart failure: Secondary | ICD-10-CM

## 2023-02-13 ENCOUNTER — Telehealth: Payer: Self-pay

## 2023-02-13 NOTE — Telephone Encounter (Signed)
Patient calls nurse line regarding request for home health services. She would like a home health aide to assist in ADLs if possible.   She states that she discussed this with Dr. Idalia Needle at last visit but has not received an update.   Will forward request to PCP.   Veronda Prude, RN

## 2023-02-14 ENCOUNTER — Ambulatory Visit (HOSPITAL_COMMUNITY)
Admission: RE | Admit: 2023-02-14 | Discharge: 2023-02-14 | Disposition: A | Discharge status: Home / Self Care | Payer: Medicare Other | Source: Ambulatory Visit | Attending: Family Medicine | Admitting: Family Medicine

## 2023-02-14 ENCOUNTER — Other Ambulatory Visit: Payer: Self-pay | Admitting: Family Medicine

## 2023-02-14 DIAGNOSIS — K219 Gastro-esophageal reflux disease without esophagitis: Secondary | ICD-10-CM | POA: Insufficient documentation

## 2023-02-14 DIAGNOSIS — R079 Chest pain, unspecified: Secondary | ICD-10-CM

## 2023-02-14 DIAGNOSIS — I429 Cardiomyopathy, unspecified: Secondary | ICD-10-CM | POA: Diagnosis not present

## 2023-02-14 DIAGNOSIS — M158 Other polyosteoarthritis: Secondary | ICD-10-CM

## 2023-02-14 DIAGNOSIS — I509 Heart failure, unspecified: Secondary | ICD-10-CM | POA: Insufficient documentation

## 2023-02-14 DIAGNOSIS — E785 Hyperlipidemia, unspecified: Secondary | ICD-10-CM | POA: Insufficient documentation

## 2023-02-14 DIAGNOSIS — F17200 Nicotine dependence, unspecified, uncomplicated: Secondary | ICD-10-CM | POA: Diagnosis not present

## 2023-02-14 DIAGNOSIS — I110 Hypertensive heart disease with heart failure: Secondary | ICD-10-CM | POA: Diagnosis not present

## 2023-02-14 LAB — ECHOCARDIOGRAM COMPLETE
Area-P 1/2: 2.29 cm2
Calc EF: 55.4 %
S' Lateral: 4 cm
Single Plane A2C EF: 50.4 %
Single Plane A4C EF: 59.3 %

## 2023-02-14 NOTE — Progress Notes (Signed)
Echocardiogram 2D Echocardiogram has been performed.  Donna Herrera 02/14/2023, 2:50 PM

## 2023-02-14 NOTE — Telephone Encounter (Signed)
Patient returns call to nurse line regarding status of receiving home health aide.   Please advise.  Veronda Prude, RN

## 2023-02-15 NOTE — Telephone Encounter (Signed)
Called patient to provide update. Patient appreciative.   Veronda Prude, RN

## 2023-02-21 ENCOUNTER — Other Ambulatory Visit: Payer: Self-pay

## 2023-02-21 ENCOUNTER — Ambulatory Visit (INDEPENDENT_AMBULATORY_CARE_PROVIDER_SITE_OTHER): Payer: Medicare Other | Admitting: Family Medicine

## 2023-02-21 ENCOUNTER — Encounter: Payer: Self-pay | Admitting: Family Medicine

## 2023-02-21 VITALS — BP 108/62 | HR 73 | Ht 66.0 in | Wt 170.4 lb

## 2023-02-21 DIAGNOSIS — E785 Hyperlipidemia, unspecified: Secondary | ICD-10-CM

## 2023-02-21 DIAGNOSIS — I501 Left ventricular failure: Secondary | ICD-10-CM

## 2023-02-21 DIAGNOSIS — R262 Difficulty in walking, not elsewhere classified: Secondary | ICD-10-CM

## 2023-02-21 MED ORDER — HYDROCORTISONE 0.5 % EX OINT: 1.0000 | TOPICAL_OINTMENT | Freq: Two times a day (BID) | CUTANEOUS | 0 refills | Status: DC

## 2023-02-21 NOTE — Progress Notes (Unsigned)
    SUBJECTIVE:   CHIEF COMPLAINT / HPI:   Patient presents for follow up  Asking about status of home health aid and wheelchair order. Grand daughter who is present is also interested in being her health aid if possible. Would like more information.   Requesting to do Cardiology labs- was scheduled to see them tomorrow just for labs and wont be able to make visit.  Denies any acute concerns    PERTINENT  PMH / PSH: Reviewed   OBJECTIVE:   BP 108/62   Pulse 73   Ht  (1.676 m)   Wt 170 lb 6.4 oz (77.3 kg)   SpO2 100%   BMI 27.50 kg/m   Physical exam General: well appearing, NAD Cardiovascular: RRR, no murmurs Lungs: CTAB. Normal WOB Abdomen: soft, non-distended, non-tender Skin: warm, dry. No edema  ASSESSMENT/PLAN:   Impaired ambulation Patient presents for follow up on referrals. Refaxed paperwork to [facility] and sent notes from last visit when we did wheelchair eval. Referral in place for home health aid. Placed referral to community care coordination for further assistance with getting an aid and to see if patient is eligible for for CAPS program so grand daughter can become her aid.    Misc  - obtains labs for cardiologist since she can't make that appt: Hepatic function panel, NMR lipoprotein  Cora Collum, DO [facility] Center

## 2023-02-21 NOTE — Patient Instructions (Signed)
It was great seeing you today!  I have ordered the labs requested from your Cardiologist  I will check on the wheelchair order. The referral is in for the home aid. We placed another referral to contact you about your grand daughter becoming a home health aid. They will get back to you in the next few weeks about that.   Feel free to call with any questions or concerns at any time, at [phone].   Take care,  Dr. Cora Collum [facility] Center

## 2023-02-22 ENCOUNTER — Ambulatory Visit: Payer: Medicare Other | Attending: Family Medicine

## 2023-02-22 ENCOUNTER — Other Ambulatory Visit: Payer: Self-pay | Admitting: Family Medicine

## 2023-02-22 ENCOUNTER — Telehealth: Payer: Self-pay | Admitting: *Deleted

## 2023-02-22 LAB — NMR, LIPOPROFILE
Cholesterol, Total: 154 mg/dL (ref 100–199)
HDL Particle Number: 39 umol/L (ref >=30.5)
HDL-C: 76 mg/dL (ref >39)
LDL Particle Number: 600 nmol/L (ref <1000)
LDL Size: 20.4 nm — ABNORMAL LOW (ref >20.5)
LDL-C (NIH Calc): 62 mg/dL (ref 0–99)
LP-IR Score: 31 (ref <=45)
Small LDL Particle Number: 321 nmol/L (ref <=527)
Triglycerides: 86 mg/dL (ref 0–149)

## 2023-02-22 LAB — HEPATIC FUNCTION PANEL
ALT: 12 IU/L (ref 0–32)
AST: 23 IU/L (ref 0–40)
Albumin: 4.5 g/dL (ref 3.8–4.8)
Alkaline Phosphatase: 115 IU/L (ref 44–121)
Bilirubin Total: 0.4 mg/dL (ref 0.0–1.2)
Bilirubin, Direct: <0.1 mg/dL (ref 0.00–0.40)
Total Protein: 7.9 g/dL (ref 6.0–8.5)

## 2023-02-22 NOTE — Progress Notes (Signed)
  Care Coordination   Note   02/22/2023 Name: Natane Heward MRN: [id number] DOB: [birth year]  Rechelle Niebla is a 77 y.o. year old female who sees Cora Collum, DO for primary care. I reached out to [name] by phone today to offer care coordination services.  Ms. Lippold was given information about Care Coordination services today including:   The Care Coordination services include support from the care team which includes your Nurse Coordinator, Clinical Social Worker, or Pharmacist.  The Care Coordination team is here to help remove barriers to the health concerns and goals most important to you. Care Coordination services are voluntary, and the patient may decline or stop services at any time by request to their care team member.   Care Coordination Consent Status: Patient agreed to services and verbal consent obtained.   Follow up plan:  Telephone appointment with care coordination team member scheduled for:  02/27/23  Encounter Outcome:  Pt. Scheduled  [facility] Coordination Care Guide  Direct Dial: [phone]

## 2023-02-23 NOTE — Assessment & Plan Note (Signed)
Patient presents for follow up on referrals. Refaxed paperwork to [facility] and sent notes from last visit when we did wheelchair eval. Referral in place for home health aid. Placed referral to community care coordination for further assistance with getting an aid and to see if patient is eligible for for CAPS program so grand daughter can become her aid.

## 2023-02-27 ENCOUNTER — Other Ambulatory Visit: Payer: Self-pay | Admitting: Family Medicine

## 2023-02-27 ENCOUNTER — Ambulatory Visit: Payer: Self-pay | Admitting: *Deleted

## 2023-02-27 NOTE — Patient Instructions (Signed)
Visit Information  Thank you for taking time to visit with me today. Please don't hesitate to contact me if I can be of assistance to you.   Following are the goals we discussed today:   Goals Addressed             This Visit's Progress    Want to get some help at home       Activities and task to complete in order to accomplish goals.   Call or go to Department of Social Services to apply/inquire about Medicaid Will mail you information on DSS walk-in setting as well to inquire/apply for Medicaid          Our next appointment is by telephone on 03/10/23   Please call the care guide team at [phone] if you need to cancel or reschedule your appointment.   If you are experiencing a Mental Health or Behavioral Health Crisis or need someone to talk to, please call the Suicide and Crisis Lifeline: 988 call 911   The patient verbalized understanding of instructions, educational materials, and care plan provided today and DECLINED offer to receive copy of patient instructions, educational materials, and care plan.   Telephone follow up appointment with care management team member scheduled for:03/10/23  Reece Levy, MSW, LCSW Clinical Social Worker [employer] [phone]

## 2023-02-27 NOTE — Patient Outreach (Addendum)
  Care Coordination   Initial Visit Note   02/27/2023 Name: Donna Herrera MRN: [id number] DOB: 06-02-1946  Donna Herrera is a 77 y.o. year old female who sees Donna Collum, DO for primary care. I spoke with  Donna Herrera by phone today.  What matters to the patients health and wellness today?  Pt wants to get help at home- CSW discussed private pay option as well as possible Medicaid programs if eligible.    Goals Addressed             This Visit's Progress    Want to get some help at home       Activities and task to complete in order to accomplish goals.   Call or go to Department of Social Services to apply/inquire about Medicaid Will mail you information on DSS walk-in setting as well to inquire/apply for Medicaid          SDOH assessments and interventions completed:  Yes  SDOH Interventions Today    Flowsheet Row Most Recent Value  SDOH Interventions   Food Insecurity Interventions Intervention Not Indicated  Housing Interventions Intervention Not Indicated  Transportation Interventions Intervention Not Indicated        Care Coordination Interventions:  Yes, provided  Interventions Today    Flowsheet Row Most Recent Value  Chronic Disease   Chronic disease during today's visit Hypertension (HTN)  General Interventions   General Interventions Discussed/Reviewed --  [Pt will need to apply for Medicaid- will mail info to her]  Advanced Directive Interventions   Advanced Directives Discussed/Reviewed Advanced Directives Discussed  [Pt reports her granddaughter, [name], is her HCPOA]       Follow up plan: Follow up call scheduled for 03/10/23    Encounter Outcome:  Pt. Visit Completed

## 2023-03-10 ENCOUNTER — Ambulatory Visit: Payer: Self-pay | Admitting: *Deleted

## 2023-03-10 NOTE — Patient Instructions (Signed)
Visit Information  Thank you for taking time to visit with me today. Please don't hesitate to contact me if I can be of assistance to you.   Following are the goals we discussed today:   Goals Addressed             This Visit's Progress    Want to get some help at home       Activities and task to complete in order to accomplish goals.   Apply for Medicaid if you have over $9000 in medical  bills-  go to Department of Social Services to apply/inquire about Medicaid Will mail you information on DSS aide program for Non- Medicaid recipients (wait list and not sure if eligible but can inquire) Follow up with [facility] for [facility] for your family member  Review Advance Directive packet received in mail and complete with Notary         Our next appointment is by telephone on 04/05/23    Please call the care guide team at [phone] if you need to cancel or reschedule your appointment.   If you are experiencing a Mental Health or Behavioral Health Crisis or need someone to talk to, please call the Suicide and Crisis Lifeline: 988 call 911   The patient verbalized understanding of instructions, educational materials, and care plan provided today and DECLINED offer to receive copy of patient instructions, educational materials, and care plan.   Telephone follow up appointment with care management team member scheduled for:04/05/23  Reece Levy, MSW, LCSW Clinical Social Worker [employer] [phone]

## 2023-03-10 NOTE — Patient Outreach (Signed)
  Care Coordination   Follow Up Visit Note   03/10/2023 Name: Donna Herrera MRN: [id number] DOB: 1946/02/01  Donna Herrera is a 77 y.o. year old female who sees Cora Collum, DO for primary care. I spoke with  [name] by phone today.  What matters to the patients health and wellness today?  Talked to DSS and not eligible for Medicaid (can apply if has $9,000 in medical bills)     Goals Addressed             This Visit's Progress    Want to get some help at home       Activities and task to complete in order to accomplish goals.   Apply for Medicaid if you have over $9000 in medical  bills-  go to Department of Social Services to apply/inquire about Medicaid Will mail you information on DSS aide program for Non- Medicaid recipients (wait list and not sure if eligible but can inquire) Follow up with [facility] for [facility] for your family member  Review Advance Directive packet received in mail and complete with Notary         SDOH assessments and interventions completed:  Yes     Care Coordination Interventions:  Yes, provided  Interventions Today    Flowsheet Row Most Recent Value  Chronic Disease   Chronic disease during today's visit Hypertension (HTN)  General Interventions   General Interventions Discussed/Reviewed [employer]  [Not eligible for Medicaid per pt's call to DSS.]  Advanced Directive Interventions   Advanced Directives Discussed/Reviewed Provided resource for acquiring and filling out documents, Advanced Directives Discussed       Follow up plan: Follow up call scheduled for 04/05/23    Encounter Outcome:  Pt. Visit Completed

## 2023-03-14 ENCOUNTER — Other Ambulatory Visit: Payer: Self-pay | Admitting: Family Medicine

## 2023-04-04 ENCOUNTER — Other Ambulatory Visit: Payer: Self-pay | Admitting: Family Medicine

## 2023-04-04 ENCOUNTER — Ambulatory Visit (INDEPENDENT_AMBULATORY_CARE_PROVIDER_SITE_OTHER): Payer: Medicare Other | Admitting: Family Medicine

## 2023-04-04 ENCOUNTER — Encounter: Payer: Self-pay | Admitting: Family Medicine

## 2023-04-04 VITALS — BP 108/60 | HR 75 | Ht 66.0 in | Wt 167.6 lb

## 2023-04-04 DIAGNOSIS — M10071 Idiopathic gout, right ankle and foot: Secondary | ICD-10-CM | POA: Diagnosis not present

## 2023-04-04 DIAGNOSIS — L602 Onychogryphosis: Secondary | ICD-10-CM | POA: Insufficient documentation

## 2023-04-04 DIAGNOSIS — B351 Tinea unguium: Secondary | ICD-10-CM

## 2023-04-04 DIAGNOSIS — Z122 Encounter for screening for malignant neoplasm of respiratory organs: Secondary | ICD-10-CM | POA: Diagnosis not present

## 2023-04-04 DIAGNOSIS — R7303 Prediabetes: Secondary | ICD-10-CM | POA: Diagnosis not present

## 2023-04-04 LAB — POCT GLYCOSYLATED HEMOGLOBIN (HGB A1C): HbA1c, POC (controlled diabetic range): 6.3 % (ref 0.0–7.0)

## 2023-04-04 NOTE — Assessment & Plan Note (Signed)
Likely caused from fungal infection. Thickened and long toe nails likely contributing to foot pain. Placed referral for podiatry as she would benefit from having them cut down.

## 2023-04-04 NOTE — Assessment & Plan Note (Signed)
A1c 6.3 and has been stable for years. Discussed cutting down on her sweets and sodas. Using shared decision making opted not to start Metformin but if it worsens can consider in the future.

## 2023-04-04 NOTE — Patient Instructions (Signed)
It was great seeing you today!  For your gout flare continue to take your Colchicine for the next few days.   I placed a referral to the foot doctors, they will call you to schedule that appointment to get your toe nails clipped  I have ordered your chest CT for lung cancer screen. Someone will call you to let you know when you can get that done and where to go.   You can take a women's one a day multivitamin daily   Lets see you back in about 3 months for your next check up/meet your new PCP, but if you need to be seen earlier than that for any new issues we're happy to fit you in, just give [name] a call!  Feel free to call with any questions or concerns at any time, at [phone].   Take care,  Dr. Cora Collum [facility]

## 2023-04-04 NOTE — Assessment & Plan Note (Signed)
Exam consistent with gout flare as she has TTP of first R toe. Not concerning for septic arthritis. Currently on Allopurinol and has started Colchicine. Recommended continuing 0.6mg  daily for the next several days. Can check Uric acid levels at next follow up in case Allopurinol needs to be dose adjusted.

## 2023-04-04 NOTE — Progress Notes (Signed)
    SUBJECTIVE:   CHIEF COMPLAINT / HPI:   Patient presents for follow up  States gout has flared up. R big toe has been hurting for the past few days. Takes Allopurinol and for the past couple days has been taking her Colchicine. Denies needing a refill. Would also like to wait on the blood draw since she just had to do it yesterday.   Saw pain doctor yesterday who asked her to discuss with her doctor about starting Metformin for prediabetes   Asks about taking B12 for energy   Health maintenance Due for Lung cancer screen, Shingrix  Started smoking at age 77 to about 17   PERTINENT  PMH / PSH: Reviewed   OBJECTIVE:   BP 108/60   Pulse 75   Ht 5\' 6"  (1.676 m)   Wt 167 lb 9.6 oz (76 kg)   SpO2 97%   BMI 27.05 kg/m    Physical exam General: well appearing, NAD Cardiovascular: RRR, no murmurs Lungs: CTAB. Normal WOB Abdomen: soft, non-distended, non-tender Skin: warm, dry. No edema Extremities: Patient with hyperpigmented thickened toenails. TTP at base of 1st R toe. Non erythematous or edematous   ASSESSMENT/PLAN:   Gout Exam consistent with gout flare as she has TTP of first R toe. Not concerning for septic arthritis. Currently on Allopurinol and has started Colchicine. Recommended continuing 0.6mg  daily for the next several days. Can check Uric acid levels at next follow up in case Allopurinol needs to be dose adjusted.   Prediabetes A1c 6.3 and has been stable for years. Discussed cutting down on her sweets and sodas. Using shared decision making opted not to start Metformin but if it worsens can consider in the future.   Hypertrophic toenail Likely caused from fungal infection. Thickened and long toe nails likely contributing to foot pain. Placed referral for podiatry as she would benefit from having them cut down.   Health maintenance  Lung cancer screen ordered  Discussed taking MVI   Cora Collum, DO [facility]

## 2023-04-05 ENCOUNTER — Ambulatory Visit: Payer: Self-pay | Admitting: *Deleted

## 2023-04-05 NOTE — Patient Instructions (Signed)
Visit Information  Thank you for taking time to visit with me today. Please don't hesitate to contact me if I can be of assistance to you.   Following are the goals we discussed today:   Goals Addressed             This Visit's Progress    COMPLETED: Want to get some help at home       Activities and task to complete in order to accomplish goals.   Glad you were able to get approved for Medicaid (Women's program). This does not cover CAP or PCS aide services unfortunately Apply for full Medicaid if you have over $9000 in medical  bills-  go to Department of Social Services to apply/inquire about Medicaid Follow up with [facility] for [facility] for your family member  Review Advance Directive packet received in mail and complete with Notary  Review material with family. Reach out if questions or needs arise.        Please call if further needs arise.  If you are experiencing a Mental Health or Behavioral Health Crisis or need someone to talk to, please call the Suicide and Crisis Lifeline: 988 call 911   The patient verbalized understanding of instructions, educational materials, and care plan provided today and DECLINED offer to receive copy of patient instructions, educational materials, and care plan.   No further follow up required:    Reece Levy, MSW, LCSW Clinical Social Worker [facility] [phone]

## 2023-04-05 NOTE — Patient Outreach (Signed)
  Care Coordination   Follow Up Visit Note   04/05/2023 Name: Donna Herrera MRN: [id number] DOB: 1946-01-11  Donna Herrera is a 77 y.o. year old female who sees Cora Collum, DO for primary care. I spoke with  Donna Herrera by phone today.  What matters to the patients health and wellness today?  Dealing with gout flare up today.    Goals Addressed             This Visit's Progress    Want to get some help at home       Activities and task to complete in order to accomplish goals.   Glad you were able to get Medicaid (Family program)  Apply for full Medicaid if you have over $9000 in medical  bills-  go to Department of Social Services to apply/inquire about Medicaid Look at the material mailed to you on the DSS aide program for Non- Medicaid recipients (wait list and not sure if eligible but can inquire) Follow up with [facility] for [facility] for your family member  Review Advance Directive packet received in mail and complete with Notary         SDOH assessments and interventions completed:  Yes     Care Coordination Interventions:  Yes, provided  Interventions Today    Flowsheet Row Most Recent Value  Chronic Disease   Chronic disease during today's visit Other  [gout flare today]  General Interventions   General Interventions Discussed/Reviewed [employer] mailed to pt with her by phone today. she indicates she was approved for "Medicaid for pregnant women" which is a partial program- does nto cover aides]  Advanced Directive Interventions   Advanced Directives Discussed/Reviewed Provided resource for acquiring and filling out documents, Advanced Directives Discussed, Advanced Directives Reviewed  [reviewed packet by phone with pt- suggested she show it to her granddaughter whom she indicates helps her things]       Follow up plan: No further intervention required.   Encounter Outcome:   Pt. Visit Completed

## 2023-04-12 ENCOUNTER — Other Ambulatory Visit: Payer: Self-pay | Admitting: Family Medicine

## 2023-04-18 ENCOUNTER — Other Ambulatory Visit: Payer: Self-pay | Admitting: Family Medicine

## 2023-04-25 ENCOUNTER — Ambulatory Visit: Payer: Medicare Other | Admitting: Podiatry

## 2023-04-25 ENCOUNTER — Encounter: Payer: Self-pay | Admitting: Podiatry

## 2023-04-25 DIAGNOSIS — I739 Peripheral vascular disease, unspecified: Secondary | ICD-10-CM | POA: Diagnosis not present

## 2023-04-25 DIAGNOSIS — L84 Corns and callosities: Secondary | ICD-10-CM

## 2023-04-25 DIAGNOSIS — B351 Tinea unguium: Secondary | ICD-10-CM | POA: Diagnosis not present

## 2023-04-25 NOTE — Progress Notes (Signed)
Subjective:  Patient ID: Donna Herrera, female    DOB: May 27, 1946,  MRN: [id number]  Donna Herrera presents to clinic today for:  Chief Complaint  Patient presents with   Debridement    Trim toenails - concerned they are "dark and twisted", sometimes her feet are numb, not diabetic  . Patient notes nails are thick and elongated, causing pain in shoe gear when ambulating.  Notes that the nails are long and starting to turn sideways.  She states that they have never done this before.  However the patient also notes that she has had difficulty trimming her own nails and it has been many months that they have not been trimmed.  She is not on a regular schedule with [name] for routine footcare.  Also notes painful calluses under the left foot.  Patient notes that she is prediabetic and is watching her diet while her PCP is checking her blood work regularly  PCP is Idalia Needle, [place] J, DO  --- Last seen 04/04/2023  Allergies  Allergen Reactions   Crab [Shellfish Allergy]     Itching to lips   Morphine And Codeine Itching and Swelling   Atorvastatin Other (See Comments)    achiness    Review of Systems: Negative except as noted in the HPI.  Objective:  There were no vitals filed for this visit.  Donna Herrera is a pleasant 77 y.o. female in NAD. AAO x 3.  Vascular Examination: Patient has palpable DP pulse, absent PT pulse bilateral.  Delayed capillary refill bilateral toes.  Sparse digital hair bilateral.  Proximal to distal cooling WNL bilateral.  Mild decrease in skin turgor with mild atrophic skin changes  Dermatological Examination: Interspaces are clear with no open lesions noted bilateral.  Nails are 3-62mm thick, with yellowish/brown discoloration, subungual debris and distal onycholysis x10.  There is pain with compression of nails x10.  There are hyperkeratotic lesions noted left submet 1 and left submet 4.  No ulceration is noted  Neurological  Examination: Protective sensation intact b/l LE. Vibratory sensation intact b/l LE.  Musculoskeletal Examination: Muscle strength 5/5 to all LE muscle groups b/l.      Latest Ref Rng & Units 04/04/2023   10:43 AM 09/06/2022   10:18 AM  Hemoglobin A1C  Hemoglobin-A1c 0.0 - 7.0 % 6.3  6.2    Patient qualifies for at-risk foot care because of mild PVD, pain in nails.  Assessment/Plan: 1. Dermatophytosis of nail   2. PVD (peripheral vascular disease) (HCC)   3. Callus of foot     Mycotic nails x10 were sharply debrided with sterile nail nippers and power debriding burr to decrease bulk and length.  Informed the patient that once the nails start getting quite long, they will hit the end of the shoe or the end of a compression sock and start turning sideways to continue growing in the path of least resistance.  Also she has been supplementing with calcium and this is often resulted in increased nail growth rate.  Hyperkeratotic lesions x 2 were shaved with #312 blade.   Return in about 3 months (around 07/26/2023) for RFC.   Clerance Lav, DPM, FACFAS [facility]     [address], [place] [address]  Office [phone]  Fax [phone]

## 2023-04-26 ENCOUNTER — Ambulatory Visit: Payer: Medicare Other | Admitting: Internal Medicine

## 2023-04-27 NOTE — Patient Instructions (Addendum)
Donna Herrera , Thank you for taking time to come for your Medicare Wellness Visit. I appreciate your ongoing commitment to your health goals. Please review the following plan we discussed and let me know if I can assist you in the future.   These are the goals we discussed:  Goals      Remain active and independent        This is a list of the screening recommended for you and due dates:  Health Maintenance  Topic Date Due   Zoster (Shingles) Vaccine (1 of 2) Never done   Screening for Lung Cancer  02/25/2022   COVID-19 Vaccine (5 - 2023-24 season) 07/01/2022   Flu Shot  06/01/2023   Medicare Annual Wellness Visit  04/27/2024   DTaP/Tdap/Td vaccine (2 - Td or Tdap) 12/14/2026   Pneumonia Vaccine  Completed   DEXA scan (bone density measurement)  Completed   Hepatitis C Screening  Completed   HPV Vaccine  Aged Out   Colon Cancer Screening  Discontinued   Cologuard (Stool DNA test)  Discontinued    Advanced directives: Information on Advanced Care Planning can be found at [facility] Advance Health Care Directives Advance Health Care Directives ([email or web address] Please bring a copy of your health care power of attorney and living will to the office to be added to your chart at your convenience.  Conditions/risks identified: Aim for 30 minutes of exercise or brisk walking, 6-8 glasses of water, and 5 servings of fruits and vegetables each day.  Next appointment: Follow up in one year for your annual wellness visit    Preventive Care 65 Years and Older, Female Preventive care refers to lifestyle choices and visits with your health care provider that can promote health and wellness. What does preventive care include? A yearly physical exam. This is also called an annual well check. Dental exams once or twice a year. Routine eye exams. Ask your health care provider how often you should have your eyes checked. Personal lifestyle choices, including: Daily care  of your teeth and gums. Regular physical activity. Eating a healthy diet. Avoiding tobacco and drug use. Limiting alcohol use. Practicing safe sex. Taking low-dose aspirin every day. Taking vitamin and mineral supplements as recommended by your health care provider. What happens during an annual well check? The services and screenings done by your health care provider during your annual well check will depend on your age, overall health, lifestyle risk factors, and family history of disease. Counseling  Your health care provider may ask you questions about your: Alcohol use. Tobacco use. Drug use. Emotional well-being. Home and relationship well-being. Sexual activity. Eating habits. History of falls. Memory and ability to understand (cognition). Work and work Astronomer. Reproductive health. Screening  You may have the following tests or measurements: Height, weight, and BMI. Blood pressure. Lipid and cholesterol levels. These may be checked every 5 years, or more frequently if you are over 18 years old. Skin check. Lung cancer screening. You may have this screening every year starting at age 4 if you have a 30-pack-year history of smoking and currently smoke or have quit within the past 15 years. Fecal occult blood test (FOBT) of the stool. You may have this test every year starting at age 25. Flexible sigmoidoscopy or colonoscopy. You may have a sigmoidoscopy every 5 years or a colonoscopy every 10 years starting at age 44. Hepatitis C blood test. Hepatitis B blood test. Sexually transmitted disease (STD) testing. Diabetes screening.  This is done by checking your blood sugar (glucose) after you have not eaten for a while (fasting). You may have this done every 1-3 years. Bone density scan. This is done to screen for osteoporosis. You may have this done starting at age 69. Mammogram. This may be done every 1-2 years. Talk to your health care provider about how often you  should have regular mammograms. Talk with your health care provider about your test results, treatment options, and if necessary, the need for more tests. Vaccines  Your health care provider may recommend certain vaccines, such as: Influenza vaccine. This is recommended every year. Tetanus, diphtheria, and acellular pertussis (Tdap, Td) vaccine. You may need a Td booster every 10 years. Zoster vaccine. You may need this after age 48. Pneumococcal 13-valent conjugate (PCV13) vaccine. One dose is recommended after age 45. Pneumococcal polysaccharide (PPSV23) vaccine. One dose is recommended after age 42. Talk to your health care provider about which screenings and vaccines you need and how often you need them. This information is not intended to replace advice given to you by your health care provider. Make sure you discuss any questions you have with your health care provider. Document Released: 11/13/2015 Document Revised: 07/06/2016 Document Reviewed: 08/18/2015 Elsevier Interactive Patient Education  2017 [employer].  Fall Prevention in the Home Falls can cause injuries. They can happen to people of all ages. There are many things you can do to make your home safe and to help prevent falls. What can I do on the outside of my home? Regularly fix the edges of walkways and driveways and fix any cracks. Remove anything that might make you trip as you walk through a door, such as a raised step or threshold. Trim any bushes or trees on the path to your home. Use bright outdoor lighting. Clear any walking paths of anything that might make someone trip, such as rocks or tools. Regularly check to see if handrails are loose or broken. Make sure that both sides of any steps have handrails. Any raised decks and porches should have guardrails on the edges. Have any leaves, snow, or ice cleared regularly. Use sand or salt on walking paths during winter. Clean up any spills in your garage right away.  This includes oil or grease spills. What can I do in the bathroom? Use night lights. Install grab bars by the toilet and in the tub and shower. Do not use towel bars as grab bars. Use non-skid mats or decals in the tub or shower. If you need to sit down in the shower, use a plastic, non-slip stool. Keep the floor dry. Clean up any water that spills on the floor as soon as it happens. Remove soap buildup in the tub or shower regularly. Attach bath mats securely with double-sided non-slip rug tape. Do not have throw rugs and other things on the floor that can make you trip. What can I do in the bedroom? Use night lights. Make sure that you have a light by your bed that is easy to reach. Do not use any sheets or blankets that are too big for your bed. They should not hang down onto the floor. Have a firm chair that has side arms. You can use this for support while you get dressed. Do not have throw rugs and other things on the floor that can make you trip. What can I do in the kitchen? Clean up any spills right away. Avoid walking on wet floors. Keep items  that you use a lot in easy-to-reach places. If you need to reach something above you, use a strong step stool that has a grab bar. Keep electrical cords out of the way. Do not use floor polish or wax that makes floors slippery. If you must use wax, use non-skid floor wax. Do not have throw rugs and other things on the floor that can make you trip. What can I do with my stairs? Do not leave any items on the stairs. Make sure that there are handrails on both sides of the stairs and use them. Fix handrails that are broken or loose. Make sure that handrails are as long as the stairways. Check any carpeting to make sure that it is firmly attached to the stairs. Fix any carpet that is loose or worn. Avoid having throw rugs at the top or bottom of the stairs. If you do have throw rugs, attach them to the floor with carpet tape. Make sure that  you have a light switch at the top of the stairs and the bottom of the stairs. If you do not have them, ask someone to add them for you. What else can I do to help prevent falls? Wear shoes that: Do not have high heels. Have rubber bottoms. Are comfortable and fit you well. Are closed at the toe. Do not wear sandals. If you use a stepladder: Make sure that it is fully opened. Do not climb a closed stepladder. Make sure that both sides of the stepladder are locked into place. Ask someone to hold it for you, if possible. Clearly mark and make sure that you can see: Any grab bars or handrails. First and last steps. Where the edge of each step is. Use tools that help you move around (mobility aids) if they are needed. These include: Canes. Walkers. Scooters. Crutches. Turn on the lights when you go into a dark area. Replace any light bulbs as soon as they burn out. Set up your furniture so you have a clear path. Avoid moving your furniture around. If any of your floors are uneven, fix them. If there are any pets around you, be aware of where they are. Review your medicines with your doctor. Some medicines can make you feel dizzy. This can increase your chance of falling. Ask your doctor what other things that you can do to help prevent falls. This information is not intended to replace advice given to you by your health care provider. Make sure you discuss any questions you have with your health care provider. Document Released: 08/13/2009 Document Revised: 03/24/2016 Document Reviewed: 11/21/2014 Elsevier Interactive Patient Education  2017 [employer].

## 2023-04-27 NOTE — Progress Notes (Signed)
Subjective:   Donna Herrera is a 77 y.o. female who presents for an Initial Medicare Annual Wellness Visit.  Visit Complete: Virtual  I connected with  Donna Herrera on 04/28/23 by a audio enabled telemedicine application and verified that I am speaking with the correct person using two identifiers.  Patient Location: Home  Provider Location: Home Office  I discussed the limitations of evaluation and management by telemedicine. The patient expressed understanding and agreed to proceed.  Review of Systems     Cardiac Risk Factors include: advanced age (>37men, >54 women);dyslipidemia;hypertension;sedentary lifestyle;smoking/ tobacco exposure     Objective:    Today's Vitals   04/28/23 0845  Weight: 167 lb (75.8 kg)  Height: 5\' 6"  (1.676 m)   Body mass index is 26.95 kg/m.     04/28/2023    9:24 AM 01/24/2023    3:57 PM 01/04/2023   11:19 AM 10/07/2022   10:53 AM 07/19/2022    8:51 AM 05/28/2022    1:46 AM 02/10/2022    3:02 PM  Advanced Directives  Does Patient Have a Medical Advance Directive? No No No No Yes No No  Type of Advance Directive     Living will    Does patient want to make changes to medical advance directive?     No - Patient declined    Would patient like information on creating a medical advance directive? Yes (MAU/Ambulatory/Procedural Areas - Information given) No - Patient declined  No - Patient declined  No - Patient declined     Current Medications (verified) Outpatient Encounter Medications as of 04/28/2023  Medication Sig   acetaminophen (TYLENOL) 500 MG tablet Take 1,000 mg by mouth daily as needed for mild pain or moderate pain.   albuterol (VENTOLIN HFA) 108 (90 Base) MCG/ACT inhaler INHALE 1 TO 2 PUFFS BY MOUTH EVERY 6 HOURS AS NEEDED FOR WHEEZE   allopurinol (ZYLOPRIM) 100 MG tablet Take 1 tablet (100 mg total) by mouth daily.   aspirin 81 MG tablet Take 1 tablet (81 mg total) by mouth daily.   baclofen (LIORESAL) 10 MG  tablet TAKE 1 TABLET 3 TIMES DAILY. WILL REFILL FOR 2 MONTH SUPPLY, PLEASE SCHEDULE APPOINTMENT   benzonatate (TESSALON) 100 MG capsule Take 1 capsule (100 mg total) by mouth 2 (two) times daily as needed for cough.   Blood Glucose Monitoring Suppl (ONETOUCH VERIO) w/Device KIT Please use to check blood sugar once daily. E11.9   carvedilol (COREG) 3.125 MG tablet TAKE 1 TABLET BY MOUTH TWICE A DAY   cetirizine (ZYRTEC) 10 MG tablet Take 10 mg by mouth daily as needed for allergies.   Cholecalciferol (VITAMIN D3) 125 MCG (5000 UT) CAPS Take 1 capsule (5,000 Units total) by mouth daily.   colchicine 0.6 MG tablet TAKE 1 TABLET BY MOUTH EVERY DAY   diclofenac Sodium (VOLTAREN) 1 % GEL Apply 4 g topically 4 (four) times daily.   DULoxetine (CYMBALTA) 30 MG capsule TAKE 1 CAPSULE(30 MG) BY MOUTH DAILY   ezetimibe (ZETIA) 10 MG tablet Take 1 tablet (10 mg total) by mouth daily.   famotidine (PEPCID) 20 MG tablet TAKE 1 TABLET (20 MG TOTAL) BY MOUTH DAILY AS NEEDED FOR HEARTBURN OR INDIGESTION.   furosemide (LASIX) 20 MG tablet TAKE 20 MG TABLET BY MOUTH ON Monday, WED, FRI   gabapentin (NEURONTIN) 300 MG capsule TAKE 1 CAPSULE BY MOUTH EVERYDAY AT BEDTIME   hydrALAZINE (APRESOLINE) 50 MG tablet TAKE 1 TABLET BY MOUTH THREE TIMES A DAY  hydrocortisone ointment 0.5 % Apply 1 Application topically 2 (two) times daily.   Lancet Devices (ONE TOUCH DELICA LANCING DEV) MISC Please use to check blood sugar once daily. E11.9   lidocaine (LIDODERM) 5 % Place 1 patch onto the skin daily. Remove & Discard patch within 12 hours or as directed by MD   Multiple Vitamin (MULTIVITAMIN WITH MINERALS) TABS tablet Take 1 tablet by mouth daily.   naloxone (NARCAN) nasal spray 4 mg/0.1 mL SMARTSIG:Both Nares   OneTouch Delica Lancets 33G MISC Please use to check blood sugar once daily. E11.9   ONETOUCH VERIO test strip PLEASE USE TO CHECK BLOOD SUGAR ONCE DAILY. E11.9   oxyCODONE (OXY IR/ROXICODONE) 5 MG immediate release  tablet Take 5 mg by mouth every 6 (six) hours as needed.   Potassium Chloride ER 20 MEQ TBCR TAKE 1 TABLET BY MOUTH AS NEEDED WHEN TAKING LASIX   rosuvastatin (CRESTOR) 20 MG tablet Take 1 tablet (20 mg total) by mouth 3 (three) times a week.   spironolactone (ALDACTONE) 25 MG tablet TAKE 1/2 TABLET BY MOUTH EVERY DAY   No facility-administered encounter medications on file as of 04/28/2023.    Allergies (verified) Crab [shellfish allergy], Morphine and codeine, and Atorvastatin   History: Past Medical History:  Diagnosis Date   Adenomatous polyp 03/23/2020   Colonoscopy 2021 notable for adenomatous polyp. Per GI, no further follow up colonoscopies recommended given patient's age.   Allergy    Arthritis    Atherosclerosis of aorta (HCC) 07/15/2021   Candidiasis, mouth 10/19/2017   Chronic combined systolic and diastolic CHF (congestive heart failure) (HCC) 10/21/2015   Echo 3/16: EF 40%, diffuse HK, mild MR, moderate LAE, mild RAE, PASP 42 mmHg  //  Echo 12/16: Mild LVH, EF 35-40%, diffuse HK, Gr 2 DD, MAC, mild MR, mod LAE, normal RVSF, PASP 49 mmHg // Echo 5/19: EF 15-20, diff HK, Gr 2 DD, trivial AI, MAC, trivial MR, mod LAE, normal RVSF, mild TR, PASP 62 // Limited Echo 8/19: mod LVH, EF 40-45, diff HK, Gr 1 DD, MAC, mild LAE    Chronic left shoulder pain 04/10/2013   Chronic lung disease    Chronic pain    Chronic pain syndrome 07/20/2017   Confusion 06/08/2021   Depression 08/25/2016   DJD (degenerative joint disease) 10/12/2012   Multiple joint replacements by Dr. Leslee Home.    Dyshidrotic eczema 12/31/2014   Edentulous 07/19/2021   Emphysema lung (HCC) 03/02/2021   Long smoking history.  Noted on CT Low Dose Chest: 01/2021: IMPRESSION: Chronic interstitial changes raise the question of pulmonary fibrosis. High-resolution chest CT may prove helpful to further evaluate as clinically warranted.Emphysema (ICD10-J43.9) and Aortic Atherosclerosis (ICD10-170.0)  Recommended pulmonology referral  but patient declined. Recommend continued discussion and symptom monitor   Essential hypertension 07/20/2017   GERD (gastroesophageal reflux disease)    Gout    H/O slipped capital femoral epiphysis (SCFE)    Heart failure, left, with LVEF 41-49% (HCC) 10/21/2015   transthoracic echocardiogram 06/06/18: EF appears improved to 40-45% with diffuse hypokinesis  A. Echo 3/16: EF 40%, diffuse HK, mild MR, moderate LAE, mild RAE, PASP 42 mmHg  //  B. Echo 12/16: Mild LVH, EF 35-40%, diffuse HK, grade 2 diastolic dysfunction, MAC, mild MR, moderate LAE, normal RVSF, PASP 49 mmHg   History of total right hip replacement 04/02/2019   Followed closely with orthopedist Dr. Charlann Boxer. Patient is s/p total right hip replacement in 1980's with evidence of bone loss and osteopenia making  reconstruction unlikely. Plan for conservative treatment at this time with crutches for ambulation and pain control.   Hyperlipidemia    Hypertension    Hypertensive heart disease with CHF (congestive heart failure) (HCC) 10/12/2012   Loss of taste 02/06/2018   Mass of breast, left 07/21/2014   NICM (nonischemic cardiomyopathy) (HCC) 06/09/2016   A. LHC 7/17: Normal coronary arteries, EF 30-35%, LVEDP 14 mmHg   Pain in gums 04/18/2018   Pre-diabetes    Pulmonary fibrosis (HCC) 03/02/2021   CT Low Dose Chest: 01/2021: IMPRESSION: Chronic interstitial changes raise the question of pulmonary fibrosis. High-resolution chest CT may prove helpful to further evaluate as clinically warranted.Emphysema (ICD10-J43.9) and Aortic Atherosclerosis (ICD10-170.0)  Recommended pulmonology referral but patient declined. Recommend continued discussion and symptom monitoring.   Pulmonary nodule 1 cm or greater in diameter 03/02/2021   CT low dose Chest: 01/2021: 1. Lung-RADS 4A, suspicious. 10 mm posterior left lower lobe pulmonary nodule, possibly scarring. Follow up low-dose chest CT without contrast in 3 months (please use the following order, "CT CHEST LCS NODULE  FOLLOW-UP W/O CM") is recommended. Alternatively, PET may be considered when there is a solid component 8mm or larger.  Plan: Follow up low dose CT ordered in 3 mon   Rib pain on left side 09/17/2020   Right hip pain 11/02/2012   Tendonitis, Achilles, left 05/24/2017   Tobacco abuse 09/29/2014   Urge incontinence 07/19/2021   Vitamin D deficiency 12/31/2014   Past Surgical History:  Procedure Laterality Date   CARDIAC CATHETERIZATION  2005   normal-Hochrein   CARDIAC CATHETERIZATION N/A 05/11/2016   Procedure: Right/Left Heart Cath and Coronary Angiography;  Surgeon: Peter M Swaziland, MD;  Location: [facility] INVASIVE CV LAB;  Service: Cardiovascular;  Laterality: N/A;   CESAREAN SECTION     COLONOSCOPY  1990's ??   HIP FUSION Left    left    JOINT REPLACEMENT Bilateral 1991, 1997   [place]   ROTATOR CUFF REPAIR     bilateral   TOOTH EXTRACTION N/A 04/03/2020   Procedure: DENTAL RESTORATION/EXTRACTIONS;  Surgeon: Ocie Doyne, DDS;  Location: [facility] OR;  Service: Oral Surgery;  Laterality: N/A;   TOTAL HIP ARTHROPLASTY  1987   right. Dr. Leslee Home   TUBAL LIGATION     Family History  Problem Relation Age of Onset   Depression Mother    Diabetes Mother    Hypertension Mother    Stroke Mother    Heart disease Mother    Cancer Father        unknown primary   Diabetes Sister    Stomach cancer Brother    Throat cancer Son    Breast cancer Neg Hx    Esophageal cancer Neg Hx    Colon cancer Neg Hx    Colon polyps Neg Hx    Social History   Socioeconomic History   Marital status: Married    Spouse name: Not on file   Number of children: Not on file   Years of education: Not on file   Highest education level: Not on file  Occupational History   Not on file  Tobacco Use   Smoking status: Former    Packs/day: 0.50    Years: 55.00    Additional pack years: 0.00    Total pack years: 27.50    Types: Cigarettes    Quit date: 2017    Years since quitting: 7.4   Smokeless tobacco: Never   Vaping Use   Vaping Use: Never used  Substance and Sexual Activity   Alcohol use: Not Currently    Comment: on holidays   Drug use: No   Sexual activity: Not on file  Other Topics Concern   Not on file  Social History Narrative   Formerly daycare provider. Lives with husband, daughter Lajoyce Corners and grandkids.   Social Determinants of Health   Financial Resource Strain: Low Risk  (04/28/2023)   Overall Financial Resource Strain (CARDIA)    Difficulty of Paying Living Expenses: Not hard at all  Food Insecurity: No Food Insecurity (04/28/2023)   Hunger Vital Sign    Worried About Running Out of Food in the Last Year: Never true    Ran Out of Food in the Last Year: Never true  Transportation Needs: No Transportation Needs (04/28/2023)   PRAPARE - Administrator, Civil Service (Medical): No    Lack of Transportation (Non-Medical): No  Physical Activity: Insufficiently Active (04/28/2023)   Exercise Vital Sign    Days of Exercise per Week: 3 days    Minutes of Exercise per Session: 30 min  Stress: No Stress Concern Present (04/28/2023)   [employer] of Occupational Health - Occupational Stress Questionnaire    Feeling of Stress : Only a little  Social Connections: Moderately Isolated (04/28/2023)   Social Connection and Isolation Panel [NHANES]    Frequency of Communication with Friends and Family: More than three times a week    Frequency of Social Gatherings with Friends and Family: Three times a week    Attends Religious Services: More than 4 times per year    Active Member of Clubs or Organizations: No    Attends Banker Meetings: Never    Marital Status: Widowed    Tobacco Counseling Counseling given: Not Answered   Clinical Intake:  Pre-visit preparation completed: Yes  Pain : No/denies pain     Diabetes: No  How often do you need to have someone help you when you read instructions, pamphlets, or other written materials from your doctor or  pharmacy?: 1 - Never  Interpreter Needed?: No  Information entered by :: Kandis Fantasia LPN   Activities of Daily Living    04/28/2023    9:24 AM  In your present state of health, do you have any difficulty performing the following activities:  Hearing? 0  Vision? 0  Difficulty concentrating or making decisions? 0  Walking or climbing stairs? 0  Dressing or bathing? 1  Doing errands, shopping? 1  Preparing Food and eating ? N  Using the Toilet? N  In the past six months, have you accidently leaked urine? N  Do you have problems with loss of bowel control? N  Managing your Medications? N  Managing your Finances? N  Housekeeping or managing your Housekeeping? Y    Patient Care Team: Cora Collum, DO as PCP - General (Family Medicine) Pricilla Riffle, MD as PCP - Cardiology (Cardiology)  Indicate any recent Medical Services you may have received from other than [facility] providers in the past year (date may be approximate).     Assessment:   This is a routine wellness examination for Donna Herrera.  Hearing/Vision screen Hearing Screening - Comments:: Denies hearing difficulties   Vision Screening - Comments:: No vision problems; will schedule routine eye exam soon    Dietary issues and exercise activities discussed:     Goals Addressed             This Visit's Progress    Remain  active and independent         Depression Screen    04/28/2023    9:22 AM 04/04/2023   10:38 AM 01/24/2023    3:56 PM 01/04/2023   11:19 AM 10/07/2022   10:54 AM 07/19/2022    8:52 AM 05/10/2022   10:39 AM  PHQ 2/9 Scores  PHQ - 2 Score 0 0 1 3 0 1 2  PHQ- 9 Score 0 0 1 5 0 4 4    Fall Risk    04/28/2023    9:24 AM 04/04/2023   10:36 AM 01/24/2023    3:57 PM 10/07/2022   10:54 AM 07/19/2022    8:53 AM  Fall Risk   Falls in the past year? 0 0 0 0 0  Number falls in past yr: 0  0 0 0  Injury with Fall? 0  0 0 0  Risk for fall due to : No Fall Risks      Follow up Falls prevention  discussed;Education provided;Falls evaluation completed        MEDICARE RISK AT HOME:  Medicare Risk at Home - 04/28/23 0924     Any stairs in or around the home? No    If so, are there any without handrails? No    Home free of loose throw rugs in walkways, pet beds, electrical cords, etc? Yes    Adequate lighting in your home to reduce risk of falls? Yes    Life alert? No    Use of a cane, walker or w/c? No    Grab bars in the bathroom? Yes    Shower chair or bench in shower? No    Elevated toilet seat or a handicapped toilet? No             TIMED UP AND GO:  Was the test performed? No    Cognitive Function:      07/15/2021    4:17 PM  Montreal Cognitive Assessment   Visuospatial/ Executive (0/5) 3  Naming (0/3) 3  Attention: Read list of digits (0/2) 1  Attention: Read list of letters (0/1) 1  Attention: Serial 7 subtraction starting at 100 (0/3) 1  Language: Repeat phrase (0/2) 1  Language : Fluency (0/1) 0  Abstraction (0/2) 1  Delayed Recall (0/5) 0  Orientation (0/6) 5  Total 16      04/28/2023    9:24 AM  6CIT Screen  What Year? 0 points  What month? 0 points  What time? 0 points  Count back from 20 0 points  Months in reverse 0 points  Repeat phrase 2 points  Total Score 2 points    Immunizations Immunization History  Administered Date(s) Administered   Fluad Quad(high Dose 65+) 09/16/2020, 07/15/2021, 07/19/2022   Influenza,inj,Quad PF,6+ Mos 07/31/2013, 09/24/2014, 09/15/2015, 09/28/2016, 09/11/2017, 08/14/2018, 08/07/2019   PFIZER Comirnaty(Gray Top)Covid-19 Tri-Sucrose Vaccine 03/18/2021   PFIZER(Purple Top)SARS-COV-2 Vaccination 12/12/2019, 01/06/2020, 09/16/2020   Pneumococcal Conjugate-13 09/24/2014   Pneumococcal Polysaccharide-23 12/14/2016   Tdap 12/14/2016    TDAP status: Up to date  Pneumococcal vaccine status: Up to date  Covid-19 vaccine status: Information provided on how to obtain vaccines.   Qualifies for Shingles  Vaccine? Yes   Zostavax completed No   Shingrix Completed?: No.    Education has been provided regarding the importance of this vaccine. Patient has been advised to call insurance company to determine out of pocket expense if they have not yet received this vaccine. Advised may also receive vaccine at  local pharmacy or Health Dept. Verbalized acceptance and understanding.  Screening Tests Health Maintenance  Topic Date Due   Zoster Vaccines- Shingrix (1 of 2) Never done   Lung Cancer Screening  02/25/2022   COVID-19 Vaccine (5 - 2023-24 season) 07/01/2022   INFLUENZA VACCINE  06/01/2023   Medicare Annual Wellness (AWV)  04/27/2024   DTaP/Tdap/Td (2 - Td or Tdap) 12/14/2026   Pneumonia Vaccine 1+ Years old  Completed   DEXA SCAN  Completed   Hepatitis C Screening  Completed   HPV VACCINES  Aged Out   Colonoscopy  Discontinued   Fecal DNA (Cologuard)  Discontinued    Health Maintenance  Health Maintenance Due  Topic Date Due   Zoster Vaccines- Shingrix (1 of 2) Never done   Lung Cancer Screening  02/25/2022   COVID-19 Vaccine (5 - 2023-24 season) 07/01/2022    Colorectal cancer screening: No longer required.   Mammogram status: No longer required due to age and preference.  Bone Density status: Completed 02/16/09. Results reflect: Bone density results: NORMAL. Repeat every - years.  Lung Cancer Screening: (Low Dose CT Chest recommended if Age 25-80 years, 20 pack-year currently smoking OR have quit w/in 15years.) does qualify.   Lung Cancer Screening Referral: scheduled for 05/08/23  Additional Screening:  Hepatitis C Screening: does qualify; Completed 10/18/17  Vision Screening: Recommended annual ophthalmology exams for early detection of glaucoma and other disorders of the eye. Is the patient up to date with their annual eye exam?  No  Who is the provider or what is the name of the office in which the patient attends annual eye exams? none If pt is not established with a  provider, would they like to be referred to a provider to establish care? No .   Dental Screening: Recommended annual dental exams for proper oral hygiene  Community Resource Referral / Chronic Care Management: CRR required this visit?  No   CCM required this visit?  No     Plan:     I have personally reviewed and noted the following in the patient's chart:   Medical and social history Use of alcohol, tobacco or illicit drugs  Current medications and supplements including opioid prescriptions. Patient is currently taking opioid prescriptions. Information provided to patient regarding non-opioid alternatives. Patient advised to discuss non-opioid treatment plan with their provider. Functional ability and status Nutritional status Physical activity Advanced directives List of other physicians Hospitalizations, surgeries, and ER visits in previous 12 months Vitals Screenings to include cognitive, depression, and falls Referrals and appointments  In addition, I have reviewed and discussed with patient certain preventive protocols, quality metrics, and best practice recommendations. A written personalized care plan for preventive services as well as general preventive health recommendations were provided to patient.     Kandis Fantasia [place], [place]   06/10/9146   After Visit Summary: (Mail) Due to this being a telephonic visit, the after visit summary with patients personalized plan was offered to patient via mail   Nurse Notes: Patient continues with flare in gout, taking medications as prescribed. Is asking for a referral for home health PT,OT and aide.

## 2023-04-28 ENCOUNTER — Ambulatory Visit (INDEPENDENT_AMBULATORY_CARE_PROVIDER_SITE_OTHER): Payer: Medicare Other

## 2023-04-28 VITALS — Ht 66.0 in | Wt 167.0 lb

## 2023-04-28 DIAGNOSIS — Z0000 Encounter for general adult medical examination without abnormal findings: Secondary | ICD-10-CM | POA: Diagnosis not present

## 2023-05-08 ENCOUNTER — Ambulatory Visit
Admission: RE | Admit: 2023-05-08 | Discharge: 2023-05-08 | Disposition: A | Discharge status: Home / Self Care | Payer: Medicare Other | Source: Ambulatory Visit | Attending: Family Medicine | Admitting: Family Medicine

## 2023-05-08 DIAGNOSIS — Z122 Encounter for screening for malignant neoplasm of respiratory organs: Secondary | ICD-10-CM

## 2023-05-11 ENCOUNTER — Encounter: Payer: Self-pay | Admitting: Family Medicine

## 2023-05-16 ENCOUNTER — Other Ambulatory Visit: Payer: Self-pay

## 2023-05-16 MED ORDER — ALBUTEROL SULFATE HFA 108 (90 BASE) MCG/ACT IN AERS: 1.0000 | INHALATION_SPRAY | Freq: Four times a day (QID) | RESPIRATORY_TRACT | 3 refills | Status: DC | PRN

## 2023-05-17 ENCOUNTER — Other Ambulatory Visit: Payer: Self-pay

## 2023-05-17 MED ORDER — DULOXETINE HCL 30 MG PO CPEP: 30.0000 mg | ORAL_CAPSULE | Freq: Every day | ORAL | 0 refills | Status: DC

## 2023-05-22 ENCOUNTER — Other Ambulatory Visit: Payer: Self-pay | Admitting: Family Medicine

## 2023-05-22 ENCOUNTER — Other Ambulatory Visit: Payer: Self-pay

## 2023-05-22 MED ORDER — GABAPENTIN 300 MG PO CAPS: ORAL_CAPSULE | ORAL | 0 refills | Status: DC

## 2023-06-16 ENCOUNTER — Other Ambulatory Visit: Payer: Self-pay | Admitting: Family Medicine

## 2023-06-20 ENCOUNTER — Telehealth: Payer: Self-pay

## 2023-06-20 NOTE — Telephone Encounter (Signed)
Patient LVM on nurse line requesting name of PCP.   Attempted to return call to patient. She did not answer, no VM available.   If patient calls back, please advise her of name of new PCP.   Veronda Prude, RN

## 2023-06-25 ENCOUNTER — Other Ambulatory Visit: Payer: Self-pay | Admitting: Internal Medicine

## 2023-06-25 ENCOUNTER — Other Ambulatory Visit: Payer: Self-pay | Admitting: Family Medicine

## 2023-06-25 DIAGNOSIS — I10 Essential (primary) hypertension: Secondary | ICD-10-CM

## 2023-06-26 ENCOUNTER — Encounter: Payer: Self-pay | Admitting: Family Medicine

## 2023-06-26 ENCOUNTER — Other Ambulatory Visit: Payer: Self-pay | Admitting: *Deleted

## 2023-06-26 DIAGNOSIS — I428 Other cardiomyopathies: Secondary | ICD-10-CM

## 2023-06-26 DIAGNOSIS — I5042 Chronic combined systolic (congestive) and diastolic (congestive) heart failure: Secondary | ICD-10-CM

## 2023-06-26 MED ORDER — SPIRONOLACTONE 25 MG PO TABS
12.5000 mg | ORAL_TABLET | Freq: Every day | ORAL | 0 refills | Status: DC
Start: 2023-06-26 — End: 2023-09-15

## 2023-06-26 MED ORDER — CARVEDILOL 3.125 MG PO TABS
3.1250 mg | ORAL_TABLET | Freq: Two times a day (BID) | ORAL | 0 refills | Status: DC
Start: 2023-06-26 — End: 2023-09-15

## 2023-06-26 MED ORDER — POTASSIUM CHLORIDE ER 20 MEQ PO TBCR: 20.0000 meq | EXTENDED_RELEASE_TABLET | Freq: Every day | ORAL | 0 refills | Status: DC

## 2023-06-26 NOTE — Progress Notes (Unsigned)
  Cardiology Office Note:    Date:  06/26/2023  ID:  Donna Herrera, DOB April 20, 1946, MRN [id number] PCP: Penne Lash, MD  [facility] Providers Cardiologist:  Dietrich Pates, MD { Click to update primary MD,subspecialty MD or APP then REFRESH:1}    {Click to Open Review  :1}   Patient Profile:      Chronic systolic CHF Cath 04/2016: normal cors Echo 5/19: EF 15-20 Echo 8/19: EF 40-45 TTE 02/14/23: EF 50-55, no RWMA Gr 1 DD, NL RVSF Non-Ischemic CM Tobacco use Hypertension  Hyperlipidemia Slipped Capital Femoral Epiphysis syndrome S/p multiple hip procedures       {  FU for CHF Last seen PR 08/2022 TTE in 01/2023 w low NL EF  Labs 09/06/22: K 4.7, SCr 1.02, Hgb 12.7 02/21/23: ALT 12, LDL 62    :1}     Discussed the use of AI scribe software for clinical note transcription with the patient, who gave verbal consent to proceed.  History of Present Illness          ROS: ***    Studies Reviewed:       *** Risk Assessment/Calculations:   {Does this patient have ATRIAL FIBRILLATION?:[phone]} No BP recorded.  {Refresh Note OR Click here to enter BP  :1}***       Physical Exam:   VS:  There were no vitals taken for this visit.   Wt Readings from Last 3 Encounters:  04/28/23 167 lb (75.8 kg)  04/04/23 167 lb 9.6 oz (76 kg)  02/21/23 170 lb 6.4 oz (77.3 kg)    Physical Exam***     Assessment and Plan:  No problem-specific Assessment & Plan notes found for this encounter. Assessment and Plan            {      :1}    {Are you ordering a CV Procedure (e.g. stress test, cath, DCCV, TEE, etc)?   Press F2        :[phone]}  Dispo:  No follow-ups on file.  Signed, Tereso Newcomer, PA-C

## 2023-06-26 NOTE — Progress Notes (Unsigned)
Cardiology Office Note:  .   Date:  06/27/2023  ID:  Donna Herrera, DOB 01/19/1946, MRN [id number] PCP: Penne Lash, MD  [facility] Providers Cardiologist:  Dietrich Pates, MD    History of Present Illness: .   Donna Herrera is a 77 y.o. female with past medical history of nonischemic cardiomyopathy, HFimpEF, HTN, HLD, tobacco use and DM.   First evaluated in 2017 for shortness of breath, noted to have echo in 2016 with LVEF of 35 to 40%. She underwent cardiac catheterization in July 2017 that demonstrated no CAD.  Echocardiogram in May 2019 demonstrated worsening LV function with an EF of 15-20%, repeat echo showed LVEF 40 to 45%. Most recent echo in 01/2023 indicated EF 50 to 55%.   Last seen by Dr. Tenny Craw in 08/2022, had remained stable from a cardiac perspective. She was started on Zetia for hyperlipidemia.   On chart review patient had visit with PCP in 12/2022 where she endorsed a period chest discomfort described as a cramping pain. EKG at that time showed SR with PVCs and PACs, LAD, minimal criteria for LVH, nonspecific t wave abnormality, no significant changes from prior EKGs. Echocardiogram was ordered which showed LVEF of 50 to 55% with no RWMA, GIDD, RV with normal function and size.   Today she presents for follow up. She reports she is doing well. Reports possible chronic cough that is intermittent. Recently has been ongoing for several weeks, non productive. Feels that when she has coughing "fits" she becomes slightly short of breath. Denies fever, or sick contacts. She denies chest pain, lower extremity edema, orthopnea or pnd.   ROS: Today she denies chest pain, lower extremity edema, palpitations, melena, hematuria, hemoptysis, diaphoresis, weakness, presyncope, syncope, orthopnea, and PND.   Studies Reviewed: .       Cardiac Studies & Procedures   CARDIAC CATHETERIZATION  CARDIAC CATHETERIZATION 05/11/2016  Narrative  There is severe left  ventricular systolic dysfunction.  1. Normal coronary anatomy 2. Severe LV dysfunction with EF 30-35%. 3. Normal right heart and LV filling pressures.  Plan: continue medical therapy.  Findings Coronary Findings Diagnostic  Dominance: Right  Left Main Vessel was injected. Vessel is normal in caliber. Vessel is angiographically normal.  Left Anterior Descending Vessel was injected. Vessel is normal in caliber. Vessel is angiographically normal.  Ramus Intermedius Vessel was injected. Vessel is normal in caliber. Vessel is angiographically normal.  Left Circumflex Vessel was injected. Vessel is normal in caliber. Vessel is angiographically normal.  Right Coronary Artery Vessel was injected. Vessel is normal in caliber. Vessel is angiographically normal.  Intervention  No interventions have been documented.     ECHOCARDIOGRAM  ECHOCARDIOGRAM COMPLETE 02/14/2023  Narrative ECHOCARDIOGRAM REPORT    Patient Name:   Donna Herrera Date of Exam: 02/14/2023 Medical Rec #:  [id number]        Height:       66.0 in Accession #:    [id number]       Weight:       174.0 lb Date of Birth:  26-Jan-1946       BSA:          1.885 m Patient Age:    76 years         BP:           100/50 mmHg Patient Gender: F                HR:  68 bpm. Exam Location:  Outpatient  Procedure: 2D Echo, Cardiac Doppler and Color Doppler  Indications:    R07.9* Chest pain, unspecified  History:        Patient has prior history of Echocardiogram examinations, most recent 06/06/2018. Cardiomyopathy and CHF, Signs/Symptoms:Chest Pain; Risk Factors:Current Smoker, Hypertension, Dyslipidemia and GERD.  Sonographer:    Eulah Pont RDCS Referring Phys: [id number] Donna Herrera  IMPRESSIONS   1. Left ventricular ejection fraction, by estimation, is 50 to 55%. The left ventricle has low normal function. The left ventricle has no regional wall motion abnormalities. Left ventricular diastolic  parameters are consistent with Grade I diastolic dysfunction (impaired relaxation). 2. Right ventricular systolic function is normal. The right ventricular size is normal. 3. The mitral valve is normal in structure. No evidence of mitral valve regurgitation. No evidence of mitral stenosis. 4. The aortic valve is tricuspid. Aortic valve regurgitation is not visualized. Aortic valve sclerosis is present, with no evidence of aortic valve stenosis.  FINDINGS Left Ventricle: Left ventricular ejection fraction, by estimation, is 50 to 55%. The left ventricle has low normal function. The left ventricle has no regional wall motion abnormalities. The left ventricular internal cavity size was normal in size. There is no left ventricular hypertrophy. Left ventricular diastolic parameters are consistent with Grade I diastolic dysfunction (impaired relaxation).  Right Ventricle: The right ventricular size is normal. Right ventricular systolic function is normal.  Left Atrium: Left atrial size was normal in size.  Right Atrium: Right atrial size was normal in size.  Pericardium: There is no evidence of pericardial effusion.  Mitral Valve: The mitral valve is normal in structure. No evidence of mitral valve regurgitation. No evidence of mitral valve stenosis.  Tricuspid Valve: The tricuspid valve is normal in structure. Tricuspid valve regurgitation is trivial. No evidence of tricuspid stenosis.  Aortic Valve: The aortic valve is tricuspid. Aortic valve regurgitation is not visualized. Aortic valve sclerosis is present, with no evidence of aortic valve stenosis.  Pulmonic Valve: The pulmonic valve was not well visualized. Pulmonic valve regurgitation is not visualized. No evidence of pulmonic stenosis.  Aorta: The aortic root is normal in size and structure.  Venous: The inferior vena cava was not well visualized.  IAS/Shunts: The interatrial septum was not well visualized.   LEFT VENTRICLE PLAX  2D LVIDd:         5.30 cm      Diastology LVIDs:         4.00 cm      LV e' medial:    4.33 cm/s LV PW:         0.90 cm      LV E/e' medial:  11.9 LV IVS:        0.90 cm      LV e' lateral:   5.91 cm/s LVOT diam:     2.00 cm      LV E/e' lateral: 8.7 LV SV:         49 LV SV Index:   26 LVOT Area:     3.14 cm  LV Volumes (MOD) LV vol d, MOD A2C: 107.0 ml LV vol d, MOD A4C: 76.5 ml LV vol s, MOD A2C: 53.1 ml LV vol s, MOD A4C: 31.1 ml LV SV MOD A2C:     53.9 ml LV SV MOD A4C:     76.5 ml LV SV MOD BP:      50.7 ml  RIGHT VENTRICLE RV S prime:  12.60 cm/s TAPSE (M-mode): 1.6 cm  LEFT ATRIUM             Index        RIGHT ATRIUM           Index LA diam:        3.10 cm 1.64 cm/m   RA Area:     10.50 cm LA Vol (A2C):   43.1 ml 22.87 ml/m  RA Volume:   18.60 ml  9.87 ml/m LA Vol (A4C):   43.4 ml 23.03 ml/m LA Biplane Vol: 44.3 ml 23.50 ml/m AORTIC VALVE LVOT Vmax:   66.90 cm/s LVOT Vmean:  45.200 cm/s LVOT VTI:    0.155 m  AORTA Ao Root diam: 3.00 cm Ao Asc diam:  3.50 cm  MITRAL VALVE MV Area (PHT): 2.29 cm    SHUNTS MV Decel Time: 331 msec    Systemic VTI:  0.16 m MV E velocity: 51.50 cm/s  Systemic Diam: 2.00 cm MV A velocity: 81.60 cm/s MV E/A ratio:  0.63  Olga Millers MD Electronically signed by Olga Millers MD Signature Date/Time: 02/14/2023/3:19:48 PM    Final                     Physical Exam:   VS:  BP 134/76   Pulse 72   Ht 5\' 6"  (1.676 m)   Wt 171 lb 12.8 oz (77.9 kg)   SpO2 98%   BMI 27.73 kg/m    Wt Readings from Last 3 Encounters:  06/27/23 171 lb 12.8 oz (77.9 kg)  04/28/23 167 lb (75.8 kg)  04/04/23 167 lb 9.6 oz (76 kg)    GEN: Well nourished, well developed in no acute distress NECK: No JVD; No carotid bruits CARDIAC: RRR, no murmurs, rubs, gallops RESPIRATORY:  Clear to auscultation without rales, wheezing or rhonchi  ABDOMEN: Soft, non-tender, non-distended EXTREMITIES:  No edema; No deformity     ASSESSMENT AND  PLAN: .    HFimpEF/NICM: First evaluated in 2017 for shortness of breath, noted to have echo in 2016 with LVEF of 35 to 40%. She underwent cardiac catheterization in July 2017 that demonstrated no CAD. Echocardiogram 01/2023 showed LVEF of 50 to 55% with no RWMA, GIDD, RV with normal function and size. Hx of intolerance to ACE/ARB for cough. Today notes possible chronic cough, denies lower extremity edema, PND or orthopnea. She appears euvolemic and well compensated on exam. Will obtain chest Xray for cough, see #2. Continue Coreg, Lasix, hydralazine, spironolactone.    COPD/Cough: CT chest on 05/08/23 showed chronic findings of interstitial lung disease that was similar to prior studies, mild diffuse bronchial wall thickening with very mild centrilobular and paraseptal emphysema suggestive of COPD. She has not followed with Pulmonary. Today she reports cough that is ongoing and aggravating, denies sick contacts. Appears euvolemic and well compensated on exam, oxygen saturation 98%. Will check X-ray today as well as CBC, BMET and BNP. Will have her complete close follow up with new PCP.   HTN: Initial blood pressure today 160/72, on recheck was 134/76. She noted being intermittently off balance, she was unsure if related to position changes. She did have a slight decreased in blood pressure from 146/76 when laying to 130/73 sitting and standing at 127/71, overall negative for ortho stasis. She denied feeling dizzy or lightheaded with position changes. Given slight fluctuations in blood pressure will defer changing medications at this time. Continue Carvedilol, furosemide, hydralazine and spironolactone.   HLD: Last lipid  profile on 02/22/23 showed total cholesterol of 154, HDL 76, triglycerides 86 and LDL 62. Continue rosuvastatin.   DM: Last A1C 6.3 on 04/04/23. Monitored and managed by PCP.   History of tobacco use: Reports that she stopped smoking in 2017.         Dispo: Follow up with Dr. Tenny Craw or Tereso Newcomer, PA in three months.   Signed, Rip Harbour, NP

## 2023-06-26 NOTE — Telephone Encounter (Signed)
Spoke with patient's granddaughter Teacher, early years/pre Ene) due to patient not answering, granddaughter stated she will inform her grandma to make an appointment after she needs another refill. Penni Bombard CMA

## 2023-06-27 ENCOUNTER — Ambulatory Visit: Payer: Medicare Other | Attending: Internal Medicine | Admitting: Cardiology

## 2023-06-27 ENCOUNTER — Encounter: Payer: Self-pay | Admitting: Cardiology

## 2023-06-27 ENCOUNTER — Ambulatory Visit: Payer: Medicare Other

## 2023-06-27 VITALS — BP 134/76 | HR 72 | Ht 66.0 in | Wt 171.8 lb

## 2023-06-27 DIAGNOSIS — J449 Chronic obstructive pulmonary disease, unspecified: Secondary | ICD-10-CM

## 2023-06-27 DIAGNOSIS — R7303 Prediabetes: Secondary | ICD-10-CM | POA: Diagnosis not present

## 2023-06-27 DIAGNOSIS — R059 Cough, unspecified: Secondary | ICD-10-CM

## 2023-06-27 DIAGNOSIS — I5032 Chronic diastolic (congestive) heart failure: Secondary | ICD-10-CM

## 2023-06-27 DIAGNOSIS — I10 Essential (primary) hypertension: Secondary | ICD-10-CM

## 2023-06-27 DIAGNOSIS — E7849 Other hyperlipidemia: Secondary | ICD-10-CM

## 2023-06-27 DIAGNOSIS — R0602 Shortness of breath: Secondary | ICD-10-CM

## 2023-06-27 NOTE — Patient Instructions (Addendum)
Medication Instructions:  Your physician recommends that you continue on your current medications as directed. Please refer to the Current Medication list given to you today.  *If you need a refill on your cardiac medications before your next appointment, please call your pharmacy*   Lab Work: TODAY:  BMET, CBC, & PRO BNP  If you have labs (blood work) drawn today and your tests are completely normal, you will receive your results only by: MyChart Message (if you have MyChart) OR A paper copy in the mail If you have any lab test that is abnormal or we need to change your treatment, we will call you to review the results.   Testing/Procedures: A chest x-ray takes a picture of the organs and structures inside the chest, including the heart, lungs, and blood vessels. This test can show several things, including, whether the heart is enlarges; whether fluid is building up in the lungs; and whether pacemaker / defibrillator leads are still in place. GO TO [facility]  [address]    TODAY FOR X-RAY  [phone]   Follow-Up: At [facility], you and your health needs are our priority.  As part of our continuing mission to provide you with exceptional heart care, we have created designated Provider Care Teams.  These Care Teams include your primary Cardiologist (physician) and Advanced Practice Providers (APPs -  Physician Assistants and Nurse Practitioners) who all work together to provide you with the care you need, when you need it.  We recommend signing up for the patient portal called "MyChart".  Sign up information is provided on this After Visit Summary.  MyChart is used to connect with patients for Virtual Visits (Telemedicine).  Patients are able to view lab/test results, encounter notes, upcoming appointments, etc.  Non-urgent messages can be sent to your provider as well.   To learn more about what you can do with MyChart, go to [email or web address].     Your next appointment:   3 month(s)  Provider:   Dietrich Pates, MD  or Tereso Newcomer, PA-C         Other Instructions  CALL YOUR PRIMARY CARE DR TODAY, [phone] AND MAKE THE 1ST AVAILABLE APPOINTMENT.

## 2023-06-28 ENCOUNTER — Other Ambulatory Visit: Payer: Self-pay | Admitting: *Deleted

## 2023-06-28 DIAGNOSIS — M1A09X0 Idiopathic chronic gout, multiple sites, without tophus (tophi): Secondary | ICD-10-CM

## 2023-06-28 LAB — BASIC METABOLIC PANEL
BUN/Creatinine Ratio: 16 (ref 12–28)
BUN: 12 mg/dL (ref 8–27)
CO2: 23 mmol/L (ref 20–29)
Calcium: 10 mg/dL (ref 8.7–10.3)
Chloride: 106 mmol/L (ref 96–106)
Creatinine, Ser: 0.73 mg/dL (ref 0.57–1.00)
Glucose: 114 mg/dL — ABNORMAL HIGH (ref 70–99)
Potassium: 4.1 mmol/L (ref 3.5–5.2)
Sodium: 141 mmol/L (ref 134–144)
eGFR: 85 mL/min/{1.73_m2} (ref >59)

## 2023-06-28 LAB — CBC
Hematocrit: 38.9 % (ref 34.0–46.6)
Hemoglobin: 11.8 g/dL (ref 11.1–15.9)
MCH: 27.5 pg (ref 26.6–33.0)
MCHC: 30.3 g/dL — ABNORMAL LOW (ref 31.5–35.7)
MCV: 91 fL (ref 79–97)
Platelets: 218 10*3/uL (ref 150–450)
RBC: 4.29 x10E6/uL (ref 3.77–5.28)
RDW: 12.8 % (ref 11.7–15.4)
WBC: 4.7 10*3/uL (ref 3.4–10.8)

## 2023-06-28 LAB — PRO B NATRIURETIC PEPTIDE: NT-Pro BNP: 371 pg/mL (ref 0–738)

## 2023-06-29 ENCOUNTER — Telehealth: Payer: Self-pay

## 2023-06-29 MED ORDER — COLCHICINE 0.6 MG PO TABS
0.6000 mg | ORAL_TABLET | Freq: Every day | ORAL | 0 refills | Status: DC
Start: 2023-06-29 — End: 2023-09-21

## 2023-06-29 NOTE — Telephone Encounter (Signed)
Patient aware of lab results. She verbalized understanding

## 2023-06-29 NOTE — Telephone Encounter (Signed)
-----   Message from Rip Harbour sent at 06/28/2023  5:41 PM EDT ----- Please let Donna Herrera know that her CBC shows no evidence of anemia or infection, her kidney function and electrolytes are normal. Her BNP shows normal fluid status. Good results!

## 2023-07-31 ENCOUNTER — Ambulatory Visit: Payer: Medicare Other | Admitting: Podiatry

## 2023-08-01 ENCOUNTER — Ambulatory Visit: Payer: Medicare Other | Admitting: Family Medicine

## 2023-08-01 ENCOUNTER — Encounter: Payer: Self-pay | Admitting: Family Medicine

## 2023-08-01 VITALS — BP 125/61 | HR 60 | Ht 66.0 in | Wt 171.1 lb

## 2023-08-01 DIAGNOSIS — I10 Essential (primary) hypertension: Secondary | ICD-10-CM

## 2023-08-01 DIAGNOSIS — M1A09X0 Idiopathic chronic gout, multiple sites, without tophus (tophi): Secondary | ICD-10-CM | POA: Diagnosis not present

## 2023-08-01 DIAGNOSIS — M25551 Pain in right hip: Secondary | ICD-10-CM | POA: Diagnosis not present

## 2023-08-01 DIAGNOSIS — E559 Vitamin D deficiency, unspecified: Secondary | ICD-10-CM | POA: Diagnosis not present

## 2023-08-01 DIAGNOSIS — Z23 Encounter for immunization: Secondary | ICD-10-CM

## 2023-08-01 DIAGNOSIS — R7303 Prediabetes: Secondary | ICD-10-CM

## 2023-08-01 DIAGNOSIS — M19211 Secondary osteoarthritis, right shoulder: Secondary | ICD-10-CM

## 2023-08-01 DIAGNOSIS — R200 Anesthesia of skin: Secondary | ICD-10-CM

## 2023-08-01 DIAGNOSIS — M19212 Secondary osteoarthritis, left shoulder: Secondary | ICD-10-CM

## 2023-08-01 MED ORDER — CVS D3 125 MCG (5000 UT) PO CAPS
5000.0000 [IU] | ORAL_CAPSULE | Freq: Every day | ORAL | 3 refills | Status: DC
Start: 2023-08-01 — End: 2023-12-02

## 2023-08-01 MED ORDER — ALLOPURINOL 100 MG PO TABS
100.0000 mg | ORAL_TABLET | Freq: Every day | ORAL | 2 refills | Status: DC
Start: 2023-08-01 — End: 2024-07-08

## 2023-08-01 MED ORDER — DICLOFENAC SODIUM 1 % EX GEL
2.0000 g | Freq: Four times a day (QID) | CUTANEOUS | Status: DC
Start: 2023-08-01 — End: 2023-11-24

## 2023-08-01 MED ORDER — GABAPENTIN 300 MG PO CAPS: 300.0000 mg | ORAL_CAPSULE | Freq: Every evening | ORAL | 2 refills | Status: DC | PRN

## 2023-08-01 MED ORDER — DULOXETINE HCL 30 MG PO CPEP
30.0000 mg | ORAL_CAPSULE | Freq: Every day | ORAL | 2 refills | Status: DC
Start: 2023-08-01 — End: 2023-12-14

## 2023-08-01 MED ORDER — GABAPENTIN 300 MG PO CAPS
300.0000 mg | ORAL_CAPSULE | Freq: Every evening | ORAL | 2 refills | Status: DC | PRN
Start: 2023-08-01 — End: 2024-08-29

## 2023-08-01 NOTE — Patient Instructions (Addendum)
It was very nice to meet you! I have sent a referral for you to see Physical Medicine and Rehab to help with your shoulder pain and getting around. I also prescribed a cream you can use on the site up to four times a day.  I have also refilled some of your medications. For you facial rash, please use a gentle cleanser such as Cetaphil for irritation and lather with Vaseline or other moisturizing cream.   Unfortunately, without medicaid it is difficult to get home health aide. Please consider reapplying for medicaid.     Thank you for choosing [facility].   Please call [phone] with any questions about today's appointment.  Please arrive at least 15 minutes prior to your scheduled appointments.   If you had blood work today, I will send you a MyChart message or a letter if results are normal. Otherwise, I will give you a call.   If you had a referral placed, they will call you to set up an appointment. Please give [name] a call if you don't hear back in the next 2 weeks.   If you need additional refills before your next appointment, please call your pharmacy first.   [name], MD Family Medicine

## 2023-08-01 NOTE — Assessment & Plan Note (Signed)
Patients recent A1C 6.3. Patient is wondering if any medication is necessary. Reassured patient that her A1C was been stable and wnl.  - ctm

## 2023-08-01 NOTE — Assessment & Plan Note (Signed)
Patient with long history of end stage degenerative changes bilaterally. Not a good candidate for surgery. Patient is currently followed by pain clinic for management. Patient complaining of increased muscle spasms and pain around shoulders - referral to PM&R  - voltaren gel prn

## 2023-08-01 NOTE — Assessment & Plan Note (Signed)
Well managed. Continue on lasix 20 mg daily, spiranolactone 12.5 mg daily, coreg 3.125mg  daily and hydralazine 50mg  TID

## 2023-08-01 NOTE — Progress Notes (Signed)
    SUBJECTIVE:   CHIEF COMPLAINT / HPI:  Patient is here to meet new PCP.  He also reports that she has a facial rash that she has been experiencing which is dry and sometimes itchy.  She reports that she tried alcohol on it and reports that that has not helped.  She reports that she has also tried cream but feels like that makes it skin.  She reports that she had similar dry skin on her hand and was prescribed hydrocortisone for it.  She reports that her pain is being managed by a pain doctor gives her oxycodone.  She reports that that medicine is helpful but she is still experiencing some pain.  She states that her chronic cough has been doing a little bit worse, probably due to weather changes.  She reports that the cough comes and bouts and will clear up and then come back again.  She states that [employer] have been very helpful in suppressing that cough when it does flareup.  She reports that her gout has been well manage, though she has been out of allopurinol.  Otherwise she reports her blood pressure has been doing well and states that she has been following closely with her cardiologist.  She reports that she was told to follow-up on her prediabetes, but it seems her A1c of recent has been within normal limits.  PERTINENT  PMH / PSH:  - CLD - Chronic Pain- Arthritis of multiple joints - emphysema - HTN - HLD - Gout - Anxiety/Depression - Vitamin D deficiency - CHF with preserved EF  OBJECTIVE:   BP 125/61   Pulse 60   Ht 5\' 6"  (1.676 m)   Wt 171 lb 2 oz (77.6 kg)   SpO2 99%   BMI 27.62 kg/m   General: Chronically ill-appearing female, standing with crutches HEENT: No sign of trauma, EOM grossly intact Cardiac: RRR, no m/r/g Respiratory: CTAB, normal WOB, no w/c/r GI: Soft, NTTP, non-distended  Extremities: NTTP, no peripheral edema. Psych: Appropriate mood and affect  ASSESSMENT/PLAN:   Essential hypertension Well managed. Continue on lasix 20 mg daily,  spiranolactone 12.5 mg daily, coreg 3.125mg  daily and hydralazine 50mg  TID  Osteoarthritis of shoulders, bilateral Patient with long history of end stage degenerative changes bilaterally. Not a good candidate for surgery. Patient is currently followed by pain clinic for management. Patient complaining of increased muscle spasms and pain around shoulders - referral to PM&R  - voltaren gel prn   Prediabetes Patients recent A1C 6.3. Patient is wondering if any medication is necessary. Reassured patient that her A1C was been stable and wnl.  - ctm    Facial Rash Patient c/o itchy and dry facial rash that comes and goes consistent with eczema. Patient with hx of eczema on hands, treated with hydrocortisone cream.  - gentle cleanser (cetaphil) to area with proper moisturize barrier following cleanser - will stay away from steroids for face as can cause hypopigmentation    Gout Refilled allopurinol. No current flares.  Health Maintenance Flu shot given  [name], MD [facility]

## 2023-08-02 ENCOUNTER — Other Ambulatory Visit: Payer: Self-pay | Admitting: Family Medicine

## 2023-08-02 ENCOUNTER — Encounter: Payer: Self-pay | Admitting: Family Medicine

## 2023-08-02 DIAGNOSIS — Z87891 Personal history of nicotine dependence: Secondary | ICD-10-CM

## 2023-08-06 ENCOUNTER — Other Ambulatory Visit: Payer: Self-pay | Admitting: Family Medicine

## 2023-08-06 DIAGNOSIS — M25551 Pain in right hip: Secondary | ICD-10-CM

## 2023-09-05 ENCOUNTER — Ambulatory Visit: Payer: Medicare Other | Admitting: Physical Therapy

## 2023-09-15 ENCOUNTER — Other Ambulatory Visit: Payer: Self-pay | Admitting: Family Medicine

## 2023-09-15 DIAGNOSIS — I428 Other cardiomyopathies: Secondary | ICD-10-CM

## 2023-09-15 DIAGNOSIS — I5042 Chronic combined systolic (congestive) and diastolic (congestive) heart failure: Secondary | ICD-10-CM

## 2023-09-19 DIAGNOSIS — J849 Interstitial pulmonary disease, unspecified: Secondary | ICD-10-CM | POA: Insufficient documentation

## 2023-09-19 NOTE — Progress Notes (Unsigned)
Cardiology Office Note:    Date:  09/20/2023  ID:  Willaim Herrera, DOB 1946/05/16, MRN [id number] PCP: Penne Lash, MD  [facility] Providers Cardiologist:  Dietrich Pates, MD       Patient Profile:      HFmrEF (heart failure with mildly reduced ejection fraction) TTE 10/2015: EF 35-40  TTE 06/06/18:  Mod LVH, EF 40-45, diff HK, Gr 1 DD, MAC, trivial MR, mild LAE, trivial TR  TTE 02/14/23: EF 50-55, no RWMA, Gr 1 DD, NL RVSF Non-ischemic cardiomyopathy  Cath 05/11/16: no CAD  Hypertension  Hyperlipidemia  Tobacco use ILD (Interstitial Lung Disease)  Hx of slipped capital femoral epiphysis syndrome  S/p multiple hip surgeries           History of Present Illness:  Discussed the use of AI scribe software for clinical note transcription with the patient, who gave verbal consent to proceed.  Donna Herrera is a 77 y.o. female who returns for follow up of CHF. She was last seen in 06/2023. Of note, she has findings on CT suggestive of ILD (Interstitial Lung Disease). She has declined pulmonology eval. She is here alone. The patient reports experiencing shortness of breath with exertion and orthopnea, requiring a propped-up position for sleep. The patient also reports a non-productive cough. The patient denies any recent weight gain. The patient has been experiencing significant joint pain (bilateral hips, knees, thoracic spine). She thinks some of her pain may be due to her cholesterol medication. The patient has previously had difficulty tolerating atorvastatin and is currently on rosuvastatin and ezetimibe. The patient also reports swelling in the joints, which has been present for about a week.      Review of Systems  Gastrointestinal:  Negative for hematochezia and melena.  Genitourinary:  Negative for hematuria.  See HPI     Studies Reviewed:        Results   LABS - Chart Review Total cholesterol: 154 (02/21/2023) HDL: 76 (02/21/2023) LDL: 62  (02/21/2023) Triglycerides: 86 (02/21/2023) Creatinine: 0.73 (06/27/2023) Potassium: 4.1 (06/27/2023) ALT: 12 (02/21/2023)      Risk Assessment/Calculations:             Physical Exam:   VS:  BP 134/68   Pulse 62   Ht 5\' 5"  (1.651 m)   Wt 172 lb (78 kg)   SpO2 97%   BMI 28.62 kg/m    Wt Readings from Last 3 Encounters:  09/20/23 172 lb (78 kg)  08/01/23 171 lb 2 oz (77.6 kg)  06/27/23 171 lb 12.8 oz (77.9 kg)    Constitutional:      Appearance: Healthy appearance. Not in distress.  Neck:     Vascular: No JVR.  Pulmonary:     Breath sounds: Normal breath sounds. No wheezing. No rales.  Cardiovascular:     Normal rate. Regular rhythm.     Murmurs: There is no murmur.  Edema:    Peripheral edema present.    Ankle: bilateral 1+ edema of the ankle. Abdominal:     Palpations: Abdomen is soft.        Assessment and Plan:   Assessment & Plan HFmrEF (Heart Failure with mildly reduced EF) EF has improved to low normal. (EF 50-55% by echocardiogram April 2024). She has had dyspnea on exertion and orthopnea. Her weights are stable, lungs clear, neck veins flat. She does not seem to be volume overloaded on exam today. Dyspnea may be related to undiagnosed pulmonary fibrosis.  - Obtain BMP and  BNP today - Continue Lasix 20 mg every Monday, Wednesday, Friday - Adjust Lasix if BNP elevated - Continue hydralazine 50 mg three times a day - Continue spironolactone 2.5 mg daily - Continue carvedilol 3.125 mg twice daily Essential hypertension Blood pressure controlled.  Continue carvedilol 3.125 mg twice daily hydralazine 50 mg 3 times a day spironolactone 12.5 mg daily. ILD (interstitial lung disease) (HCC) Evidence of interstitial lung disease on prior CT scan. Reports nonproductive cough and dyspnea. Agrees to pulmonology referral.  - Refer to pulmonology Pure hypercholesterolemia Significant arthralgia potentially related to rosuvastatin. Intolerance to atorvastatin noted in  the past. Plan to stop rosuvastatin and ezetimibe for two weeks, then retry ezetimibe. If intolerance persists, consider referral to lipid clinic for PCSK9 inhibitor therapy.  - Stop rosuvastatin and ezetimibe for two weeks - Retry ezetimibe after two weeks - Refer to lipid clinic for PCSK9 inhibitor therapy if intolerance persists Osteoarthritis, unspecified osteoarthritis type, unspecified site Significant pain in multiple joints, including right hip and left knee. Follow-up with pain physician for better management.          Dispo:  Return in about 6 months (around 03/19/2024) for Routine Follow Up, w/ Dr. Tenny Craw, or Tereso Newcomer, PA-C.  Signed, Tereso Newcomer, PA-C

## 2023-09-20 ENCOUNTER — Ambulatory Visit: Payer: Medicare Other | Attending: Physician Assistant | Admitting: Physician Assistant

## 2023-09-20 ENCOUNTER — Encounter: Payer: Self-pay | Admitting: Physician Assistant

## 2023-09-20 ENCOUNTER — Other Ambulatory Visit: Payer: Self-pay | Admitting: Family Medicine

## 2023-09-20 VITALS — BP 134/68 | HR 62 | Ht 65.0 in | Wt 172.0 lb

## 2023-09-20 DIAGNOSIS — R0602 Shortness of breath: Secondary | ICD-10-CM

## 2023-09-20 DIAGNOSIS — I501 Left ventricular failure: Secondary | ICD-10-CM

## 2023-09-20 DIAGNOSIS — E7800 Pure hypercholesterolemia, unspecified: Secondary | ICD-10-CM

## 2023-09-20 DIAGNOSIS — J849 Interstitial pulmonary disease, unspecified: Secondary | ICD-10-CM | POA: Diagnosis not present

## 2023-09-20 DIAGNOSIS — I10 Essential (primary) hypertension: Secondary | ICD-10-CM

## 2023-09-20 DIAGNOSIS — M1990 Unspecified osteoarthritis, unspecified site: Secondary | ICD-10-CM

## 2023-09-20 DIAGNOSIS — J8410 Pulmonary fibrosis, unspecified: Secondary | ICD-10-CM

## 2023-09-20 DIAGNOSIS — M1A09X0 Idiopathic chronic gout, multiple sites, without tophus (tophi): Secondary | ICD-10-CM

## 2023-09-20 NOTE — Assessment & Plan Note (Signed)
Significant arthralgia potentially related to rosuvastatin. Intolerance to atorvastatin noted in the past. Plan to stop rosuvastatin and ezetimibe for two weeks, then retry ezetimibe. If intolerance persists, consider referral to lipid clinic for PCSK9 inhibitor therapy.  - Stop rosuvastatin and ezetimibe for two weeks - Retry ezetimibe after two weeks - Refer to lipid clinic for PCSK9 inhibitor therapy if intolerance persists

## 2023-09-20 NOTE — Assessment & Plan Note (Signed)
Blood pressure controlled.  Continue carvedilol 3.125 mg twice daily hydralazine 50 mg 3 times a day spironolactone 12.5 mg daily.

## 2023-09-20 NOTE — Assessment & Plan Note (Signed)
Evidence of interstitial lung disease on prior CT scan. Reports nonproductive cough and dyspnea. Agrees to pulmonology referral.  - Refer to pulmonology

## 2023-09-20 NOTE — Patient Instructions (Signed)
Medication Instructions:  Stop: Rosuvastatin (Crestor)  Stop: Ezetimibe (Zetia) Restart: After two weeks, start Ezetimibe (Zetia) ; call office if you have symptoms again. *If you need a refill on your cardiac medications before your next appointment, please call your pharmacy*   Lab Work: BMET and BNP today If you have labs (blood work) drawn today and your tests are completely normal, you will receive your results only by: MyChart Message (if you have MyChart) OR A paper copy in the mail If you have any lab test that is abnormal or we need to change your treatment, we will call you to review the results.   Testing/Procedures: None  Follow-Up: At [facility], you and your health needs are our priority.  As part of our continuing mission to provide you with exceptional heart care, we have created designated Provider Care Teams.  These Care Teams include your primary Cardiologist (physician) and Advanced Practice Providers (APPs -  Physician Assistants and Nurse Practitioners) who all work together to provide you with the care you need, when you need it.  Your next appointment:   6 month(s)  Provider:   Dietrich Pates, MD  or Tereso Newcomer, PA {If Card or EP not listed click to update    Other Instructions Referral made to Pulmonology (see next page/information)

## 2023-09-20 NOTE — Assessment & Plan Note (Signed)
EF has improved to low normal. (EF 50-55% by echocardiogram April 2024). She has had dyspnea on exertion and orthopnea. Her weights are stable, lungs clear, neck veins flat. She does not seem to be volume overloaded on exam today. Dyspnea may be related to undiagnosed pulmonary fibrosis.  - Obtain BMP and BNP today - Continue Lasix 20 mg every Monday, Wednesday, Friday - Adjust Lasix if BNP elevated - Continue hydralazine 50 mg three times a day - Continue spironolactone 2.5 mg daily - Continue carvedilol 3.125 mg twice daily

## 2023-09-20 NOTE — Assessment & Plan Note (Signed)
Significant pain in multiple joints, including right hip and left knee. Follow-up with pain physician for better management.

## 2023-09-21 LAB — BASIC METABOLIC PANEL
BUN/Creatinine Ratio: 15 (ref 12–28)
BUN: 9 mg/dL (ref 8–27)
CO2: 26 mmol/L (ref 20–29)
Calcium: 9.5 mg/dL (ref 8.7–10.3)
Chloride: 104 mmol/L (ref 96–106)
Creatinine, Ser: 0.62 mg/dL (ref 0.57–1.00)
Glucose: 96 mg/dL (ref 70–99)
Potassium: 4.2 mmol/L (ref 3.5–5.2)
Sodium: 144 mmol/L (ref 134–144)
eGFR: 92 mL/min/{1.73_m2} (ref >59)

## 2023-09-21 LAB — BRAIN NATRIURETIC PEPTIDE: BNP: 74.3 pg/mL (ref 0.0–100.0)

## 2023-09-22 ENCOUNTER — Other Ambulatory Visit: Payer: Self-pay | Admitting: Family Medicine

## 2023-09-22 DIAGNOSIS — M1A09X0 Idiopathic chronic gout, multiple sites, without tophus (tophi): Secondary | ICD-10-CM

## 2023-09-24 ENCOUNTER — Other Ambulatory Visit: Payer: Self-pay | Admitting: Internal Medicine

## 2023-09-25 NOTE — Progress Notes (Signed)
Pt has been made aware of normal result and verbalized understanding.  jw

## 2023-09-28 ENCOUNTER — Encounter (HOSPITAL_COMMUNITY): Payer: Self-pay

## 2023-09-28 ENCOUNTER — Emergency Department (HOSPITAL_COMMUNITY)
Admission: EM | Admit: 2023-09-28 | Discharge: 2023-09-28 | Disposition: A | Discharge status: Home / Self Care | Payer: Medicare Other | Attending: Emergency Medicine | Admitting: Emergency Medicine

## 2023-09-28 ENCOUNTER — Emergency Department (HOSPITAL_COMMUNITY): Payer: Medicare Other

## 2023-09-28 ENCOUNTER — Other Ambulatory Visit: Payer: Self-pay

## 2023-09-28 DIAGNOSIS — I110 Hypertensive heart disease with heart failure: Secondary | ICD-10-CM | POA: Insufficient documentation

## 2023-09-28 DIAGNOSIS — M130 Polyarthritis, unspecified: Secondary | ICD-10-CM | POA: Insufficient documentation

## 2023-09-28 DIAGNOSIS — Z79899 Other long term (current) drug therapy: Secondary | ICD-10-CM | POA: Insufficient documentation

## 2023-09-28 DIAGNOSIS — M2550 Pain in unspecified joint: Secondary | ICD-10-CM

## 2023-09-28 DIAGNOSIS — Z7982 Long term (current) use of aspirin: Secondary | ICD-10-CM | POA: Insufficient documentation

## 2023-09-28 DIAGNOSIS — I509 Heart failure, unspecified: Secondary | ICD-10-CM | POA: Insufficient documentation

## 2023-09-28 DIAGNOSIS — Z20822 Contact with and (suspected) exposure to covid-19: Secondary | ICD-10-CM | POA: Diagnosis not present

## 2023-09-28 DIAGNOSIS — M25511 Pain in right shoulder: Secondary | ICD-10-CM | POA: Diagnosis present

## 2023-09-28 DIAGNOSIS — R059 Cough, unspecified: Secondary | ICD-10-CM | POA: Diagnosis not present

## 2023-09-28 LAB — RESP PANEL BY RT-PCR (RSV, FLU A&B, COVID)  RVPGX2
Influenza A by PCR: NEGATIVE
Influenza B by PCR: NEGATIVE
Resp Syncytial Virus by PCR: NEGATIVE
SARS Coronavirus 2 by RT PCR: NEGATIVE

## 2023-09-28 LAB — CBC WITH DIFFERENTIAL/PLATELET
Abs Immature Granulocytes: 0.02 10*3/uL (ref 0.00–0.07)
Basophils Absolute: 0 10*3/uL (ref 0.0–0.1)
Basophils Relative: 0 %
Eosinophils Absolute: 0.1 10*3/uL (ref 0.0–0.5)
Eosinophils Relative: 2 %
HCT: 36.1 % (ref 36.0–46.0)
Hemoglobin: 10.9 g/dL — ABNORMAL LOW (ref 12.0–15.0)
Immature Granulocytes: 0 %
Lymphocytes Relative: 26 %
Lymphs Abs: 1.8 10*3/uL (ref 0.7–4.0)
MCH: 28.2 pg (ref 26.0–34.0)
MCHC: 30.2 g/dL (ref 30.0–36.0)
MCV: 93.3 fL (ref 80.0–100.0)
Monocytes Absolute: 0.7 10*3/uL (ref 0.1–1.0)
Monocytes Relative: 11 %
Neutro Abs: 4.1 10*3/uL (ref 1.7–7.7)
Neutrophils Relative %: 61 %
Platelets: 285 10*3/uL (ref 150–400)
RBC: 3.87 MIL/uL (ref 3.87–5.11)
RDW: 15 % (ref 11.5–15.5)
WBC: 6.8 10*3/uL (ref 4.0–10.5)
nRBC: 0 % (ref 0.0–0.2)

## 2023-09-28 LAB — MAGNESIUM: Magnesium: 2.5 mg/dL — ABNORMAL HIGH (ref 1.7–2.4)

## 2023-09-28 LAB — COMPREHENSIVE METABOLIC PANEL
ALT: 10 U/L (ref 0–44)
AST: 16 U/L (ref 15–41)
Albumin: 3.8 g/dL (ref 3.5–5.0)
Alkaline Phosphatase: 93 U/L (ref 38–126)
Anion gap: 11 (ref 5–15)
BUN: 9 mg/dL (ref 8–23)
CO2: 22 mmol/L (ref 22–32)
Calcium: 8.4 mg/dL — ABNORMAL LOW (ref 8.9–10.3)
Chloride: 102 mmol/L (ref 98–111)
Creatinine, Ser: 0.62 mg/dL (ref 0.44–1.00)
GFR, Estimated: >60 mL/min (ref >60)
Glucose, Bld: 79 mg/dL (ref 70–99)
Potassium: 3.7 mmol/L (ref 3.5–5.1)
Sodium: 135 mmol/L (ref 135–145)
Total Bilirubin: 0.9 mg/dL (ref <1.2)
Total Protein: 7.6 g/dL (ref 6.5–8.1)

## 2023-09-28 LAB — URINALYSIS, ROUTINE W REFLEX MICROSCOPIC
Bilirubin Urine: NEGATIVE
Glucose, UA: NEGATIVE mg/dL
Hgb urine dipstick: NEGATIVE
Ketones, ur: 5 mg/dL — AB
Leukocytes,Ua: NEGATIVE
Nitrite: NEGATIVE
Protein, ur: NEGATIVE mg/dL
Specific Gravity, Urine: 1.008 (ref 1.005–1.030)
pH: 6 (ref 5.0–8.0)

## 2023-09-28 LAB — CK: Total CK: 202 U/L (ref 38–234)

## 2023-09-28 MED ORDER — OXYCODONE HCL 5 MG PO TABS
5.0000 mg | ORAL_TABLET | Freq: Once | ORAL | Status: AC
  Administered 2023-09-28: 5 mg via ORAL
  Filled 2023-09-28: qty 1

## 2023-09-28 MED ORDER — ACETAMINOPHEN 500 MG PO TABS
1000.0000 mg | ORAL_TABLET | Freq: Once | ORAL | Status: AC
  Administered 2023-09-28: 1000 mg via ORAL
  Filled 2023-09-28: qty 2

## 2023-09-28 NOTE — ED Provider Notes (Signed)
[place] EMERGENCY DEPARTMENT AT [facility] Provider Note   CSN: [id number] Arrival date & time: 09/28/23  1657     History  Chief Complaint  Patient presents with   Joint Pain    Donna Herrera is a 77 y.o. female.  HPI Medical history significant for CHF, gout, emphysema, hypertension, chronic pain with advanced arthritis of multiple joints.  Patient is here with family members.  Family members report that the patient is in significantly more pain than baseline.  They reports that any movement of her arms or legs causes her severe pain.  Normally she gets up and walks around with her walker but today has had too much pain and even with assistance was difficult to get up and go to the bathroom.  Patient denies she has had fever vomiting or diarrhea.  She endorses pain in her shoulders arms back and legs.  Not particularly localizing.  None of her joints have gotten swollen or red.  Patient reports symptoms seem to gotten worse after he straight started Fosamax.  She filled out approximately 10 days ago.  Her Cardiologist, Dr. Alben Spittle, recently stopped her Crestor and Zetia as possible sources of worsening arthralgia symptoms.  Reports she has had some cough more recently.  No shortness of breath.    Home Medications Prior to Admission medications   Medication Sig Start Date End Date Taking? Authorizing Provider  acetaminophen (TYLENOL) 500 MG tablet Take 1,000 mg by mouth daily as needed for mild pain or moderate pain.    [provider]  albuterol (VENTOLIN HFA) 108 (90 Base) MCG/ACT inhaler Inhale 1-2 puffs into the lungs every 6 (six) hours as needed for wheezing or shortness of breath. 05/16/23   Baloch, Mahnoor, MD  alendronate (FOSAMAX) 70 MG tablet Take 70 mg by mouth once a week. 09/19/23   [provider]  allopurinol (ZYLOPRIM) 100 MG tablet Take 1 tablet (100 mg total) by mouth daily. 08/01/23   Baloch, Mahnoor, MD  aspirin 81 MG tablet  Take 1 tablet (81 mg total) by mouth daily. 04/16/15   Latrelle Dodrill, MD  baclofen (LIORESAL) 10 MG tablet TAKE 1 TABLET 3 TIMES DAILY. WILL REFILL FOR 2 MONTH SUPPLY, PLEASE SCHEDULE APPOINTMENT 12/07/22   Cora Collum, DO  carvedilol (COREG) 3.125 MG tablet TAKE 1 TABLET BY MOUTH 2 TIMES DAILY. 09/15/23   Baloch, Mahnoor, MD  Cholecalciferol ([facility] D3) 125 MCG (5000 UT) capsule Take 1 capsule (5,000 Units total) by mouth daily. 08/01/23   Baloch, Mahnoor, MD  colchicine 0.6 MG tablet TAKE 1 TABLET BY MOUTH EVERY DAY 09/21/23   Baloch, Mahnoor, MD  DULoxetine (CYMBALTA) 30 MG capsule Take 1 capsule (30 mg total) by mouth daily. 08/01/23   Baloch, Mahnoor, MD  ezetimibe (ZETIA) 10 MG tablet TAKE 1 TABLET BY MOUTH EVERY DAY 09/25/23   Pricilla Riffle, MD  famotidine (PEPCID) 20 MG tablet TAKE 1 TABLET (20 MG TOTAL) BY MOUTH DAILY AS NEEDED FOR HEARTBURN OR INDIGESTION. 03/07/22   Idalia Needle, Lucas Mallow, DO  furosemide (LASIX) 20 MG tablet TAKE 20 MG TABLET BY MOUTH ON Monday, WED, FRI 01/13/22   Pricilla Riffle, MD  gabapentin (NEURONTIN) 300 MG capsule Take 1 capsule (300 mg total) by mouth at bedtime as needed. TAKE 1 CAP BY MOUTH EVERYDAY AT BEDTIME. 08/01/23   Baloch, Mahnoor, MD  hydrALAZINE (APRESOLINE) 50 MG tablet TAKE 1 TABLET BY MOUTH THREE TIMES A DAY 06/27/23   Pricilla Riffle, MD  lidocaine (LIDODERM) 5 % Place 1 patch onto the skin daily. Remove & Discard patch within 12 hours or as directed by MD 07/19/22   Cora Collum, DO  Multiple Vitamin (MULTIVITAMIN WITH MINERALS) TABS tablet Take 1 tablet by mouth daily.    [provider]  naloxone [facility]) nasal spray 4 mg/0.1 mL SMARTSIG:Both Nares 04/03/23   [provider]  oxyCODONE (OXY IR/ROXICODONE) 5 MG immediate release tablet Take 5 mg by mouth every 6 (six) hours as needed for moderate pain or severe pain. 04/03/23   [provider]  Potassium Chloride ER 20 MEQ TBCR Take 1 tablet (20 mEq total) by mouth daily. 06/26/23    Baloch, Mahnoor, MD  spironolactone (ALDACTONE) 25 MG tablet TAKE 1/2 TABLET BY MOUTH EVERY DAY 09/15/23   Baloch, Mahnoor, MD  Vitamin D, Ergocalciferol, (DRISDOL) 1.25 MG (50000 UNIT) CAPS capsule Take 50,000 Units by mouth once a week. 08/01/23   [provider]      Allergies    Parke Simmers allergy], Morphine and codeine, and Atorvastatin    Review of Systems   Review of Systems  Physical Exam Updated Vital Signs BP (!) 136/105   Pulse 66   Temp 99.1 F (37.3 C) (Oral)   Resp 16   Ht 5\' 5"  (1.651 m)   Wt 79.8 kg   SpO2 99%   BMI 29.29 kg/m  Physical Exam Constitutional:      Comments: Patient is alert.  No acute distress.  No respiratory stress at rest.  Mental status clear.  HENT:     Mouth/Throat:     Pharynx: Oropharynx is clear.  Eyes:     Extraocular Movements: Extraocular movements intact.  Cardiovascular:     Rate and Rhythm: Normal rate and regular rhythm.  Pulmonary:     Effort: Pulmonary effort is normal.     Breath sounds: Normal breath sounds.  Abdominal:     Comments: Abdomen soft no guarding.  Patient endorses some pain diffusely.  She endorses some pain slightly increased to the right upper quadrant.  Musculoskeletal:     Comments: Patient has extensive arthritic changes of the wrist.  However there is no erythema warmth or appearance of acute effusions.  Bilateral knees have well-healed scars from replacements.  No significant effusion or erythema.  Overlying skin is normal.  No significant peripheral edema.'s are soft and pliable.  Feet are in good condition without any edema wounds or erythema.     ED Results / Procedures / Treatments   Labs (all labs ordered are listed, but only abnormal results are displayed) Labs Reviewed  COMPREHENSIVE METABOLIC PANEL - Abnormal; Notable for the following components:      Result Value   Calcium 8.4 (*)    All other components within normal limits  MAGNESIUM - Abnormal; Notable for the  following components:   Magnesium 2.5 (*)    All other components within normal limits  CBC WITH DIFFERENTIAL/PLATELET - Abnormal; Notable for the following components:   Hemoglobin 10.9 (*)    All other components within normal limits  URINALYSIS, ROUTINE W REFLEX MICROSCOPIC - Abnormal; Notable for the following components:   Ketones, ur 5 (*)    All other components within normal limits  RESP PANEL BY RT-PCR (RSV, FLU A&B, COVID)  RVPGX2  CK    EKG None  Radiology No results found.  Procedures Procedures    Medications Ordered in ED Medications  oxyCODONE (Oxy IR/ROXICODONE) immediate release tablet 5 mg (  5 mg Oral Given 09/28/23 1733)  acetaminophen (TYLENOL) tablet 1,000 mg (1,000 mg Oral Given 09/28/23 1954)    ED Course/ Medical Decision Making/ A&P                                 Medical Decision Making Amount and/or Complexity of Data Reviewed Labs: ordered. Radiology: ordered.  Risk OTC drugs. Prescription drug management.   EMR reviewed.  Patient does have multiple medical comorbidities including extensive arthralgia and myalgia.  Family members present thought that the patient's pain quality and severity was different.  Patient does endorse that she felt like she would had more pain earlier although now it is seemingly closer to baseline.  Patient is nontoxic.  There is no real localizing.  Will obtain basic lab work to rule out rhabdomyolysis, patient had previously been on hyperlipidemia agent but is been stopped, electrolyte derangement, infectious process with myalgia such as COVID or flu.  Magnesium 2.5, CK2 02 comprehensive metabolic panel normal.  CBC with differential normal.  Urinalysis negative respiratory panel negative.  Diagnostic workup is unremarkable.  I have reassessed the patient after completion of diagnostic evaluation.  She does feel that she is essentially at her baseline for pain level at this time.  Patient feels that she can function  as she does using her walker and can be adequately mobile at home.  At this time she wishes to be discharged and has multiple family at home for assistance.  I had reviewed the patient's evaluation with several family members who are at bedside at varying portions of the patient's evaluation.  At time of discharge I did stand with the patient on her phone as she spoke with her family members.  At completion of evaluation, I did not have a family member present to complete review of diagnostic results but patient communicated directly with her granddaughter who is picking her up at the emergency department.        Final Clinical Impression(s) / ED Diagnoses Final diagnoses:  Polyarthralgia    Rx / DC Orders ED Discharge Orders     None         Arby Barrette, MD 10/09/23 [phone]

## 2023-09-28 NOTE — ED Notes (Signed)
Assisted patient on bedpan.

## 2023-09-28 NOTE — ED Triage Notes (Addendum)
[name] from home. Joint pain since Monday. Has arthritis and osteoporosis. Started new medication recently (Fosamax). Pain started in neck and moved down her body. Patient unable to move around as well around the house.

## 2023-09-28 NOTE — Discharge Instructions (Addendum)
1.  At this time your labs did not show any significant abnormality to explain your increasing joint and muscle pain. 2.  You have recently stopped taking your cholesterol medications rosuvastatin and ezetimibe orders from your heart doctor.  These medications were stopped in case they are causing you muscle and joint pain.  Lab work was done in the emergency department to check on muscle enzymes.  There is no abnormal elevation this time.  Do not resume these medications specifically told to do so by your cardiologist. 3.  You reported an increase in pain after starting Fosamax.  Do not take any more Fosamax till you have reviewed this with your doctor. 4.  If you have cut back on your pain medications because you are running out early, this can also cause more pain.  Your body is used to having pain controlled at a certain level of pain medication and when you take less for a while, you may experience more general pain.  You have to discuss this with your pain management specialist.  You do not want to run out ahead of schedule.  You can go into withdrawal symptoms which will be very uncomfortable with cramping abdominal pain diarrhea and general pain. 5.  Return to the emergency department if you have a fever, other specific symptoms you identify or other concerning changes.

## 2023-09-28 NOTE — ED Notes (Addendum)
Unsuccessful blood work attempts by [employer] and NT. IV team consult in place.

## 2023-09-28 NOTE — ED Notes (Signed)
Called phlebotomy to please come get labs. They said they are on the way.

## 2023-09-28 NOTE — ED Notes (Signed)
X-ray at bedside

## 2023-11-03 ENCOUNTER — Encounter: Payer: Self-pay | Admitting: Family Medicine

## 2023-11-03 ENCOUNTER — Ambulatory Visit: Payer: Medicare Other | Admitting: Family Medicine

## 2023-11-03 VITALS — BP 110/62 | HR 60 | Wt 173.0 lb

## 2023-11-03 DIAGNOSIS — E559 Vitamin D deficiency, unspecified: Secondary | ICD-10-CM | POA: Diagnosis not present

## 2023-11-03 DIAGNOSIS — E785 Hyperlipidemia, unspecified: Secondary | ICD-10-CM | POA: Diagnosis not present

## 2023-11-03 DIAGNOSIS — Z23 Encounter for immunization: Secondary | ICD-10-CM | POA: Diagnosis not present

## 2023-11-03 DIAGNOSIS — R7303 Prediabetes: Secondary | ICD-10-CM | POA: Diagnosis not present

## 2023-11-03 DIAGNOSIS — I501 Left ventricular failure: Secondary | ICD-10-CM

## 2023-11-03 NOTE — Assessment & Plan Note (Addendum)
 Patient with history of heart failure, followed by cardiology.  Is describing some shortness of breath and bilateral lower leg edema for the past few days.  Ambulatory pulse ox greater than 90%. Weight up ~1 lb up from cardiologist office  - Increase Lasix  dosing to every day for the next week - If SOB or swelling not improving, return to clinic for follow-up - ED precautions given

## 2023-11-03 NOTE — Progress Notes (Addendum)
    SUBJECTIVE:   CHIEF COMPLAINT / HPI:   Presents for three month follow up.  She is still having some pain which is caused by the arthritis in her joints.  She is taking oxycodone  as needed but does not take it every 6 hours as prescribed.  Has noticed in the last 2 days some increased shortness of breath and swelling in both of her legs. Denies any chest pain.   PERTINENT  PMH / PSH:  CHF Hypertension COPD Pulmonary fibrosis Osteoarthritis DDD Vitamin D  deficiency Gout Prediabetes   OBJECTIVE:   BP 110/62   Pulse 60   Wt 173 lb (78.5 kg)   SpO2 92% Comment: O2 92% ambulating.  BMI 28.79 kg/m   General: A&O, NAD HEENT: No sign of trauma, EOM grossly intact Cardiac: RRR, no m/r/g Respiratory: CTAB, normal WOB, no w/c/r GI: Soft, NTTP, non-distended  Extremities: NTTP, no peripheral edema. Neuro: Normal gait, moves all four extremities appropriately. Psych: Appropriate mood and affect   ASSESSMENT/PLAN:   HFmrEF (Heart Failure with mildly reduced EF) Patient with history of heart failure, followed by cardiology.  Is describing some shortness of breath and bilateral lower leg edema for the past few days.  Ambulatory pulse ox greater than 90%. Weight up ~1 lb up from cardiologist office  - Increase Lasix  dosing to every day for the next week - If SOB or swelling not improving, return to clinic for follow-up - ED precautions given  Vitamin D  deficiency Vit D level today.  Prediabetes Will order A1C today   Hyperlipidemia Patient had muscle pain with rosuvastatin , was told to stop taking both rosuvastatin  and ezetimibe  by cardiology and restart ezetimibe . Patient has not restarted. Will obtain lipid panel today and adjust/restart as needed.      Gloriann Ogren, MD [facility]

## 2023-11-03 NOTE — Assessment & Plan Note (Signed)
 Patient had muscle pain with rosuvastatin, was told to stop taking both rosuvastatin and ezetimibe by cardiology and restart ezetimibe. Patient has not restarted. Will obtain lipid panel today and adjust/restart as needed.

## 2023-11-03 NOTE — Assessment & Plan Note (Signed)
Vit D level today 

## 2023-11-03 NOTE — Patient Instructions (Addendum)
 It was great to see you today! Thank you for choosing [facility] for your primary care. Donna Herrera was seen for follow up.  Today we addressed: We drew some labs today, looking at your cholesterol and your A1C to check for diabetes. I will call you with these results or MyChart you if they are normal. I believe you might be having a worsening of your heart failure. Take your lasix  daily for this week. If you do not notice an improvement by next week, please return for follow up   If you haven't already, sign up for My Chart to have easy access to your labs results, and communication with your primary care physician.  We are checking some labs today. If they are abnormal, I will call you. If they are normal, I will send you a MyChart message (if it is active) or a letter in the mail. If you do not hear about your labs in the next 2 weeks, please call the office.   You should return to our clinic as needed   I recommend that you always bring your medications to each appointment as this makes it easy to ensure you are on the correct medications and helps us  not miss refills when you need them.  Please arrive 15 minutes before your appointment to ensure smooth check in process.  We appreciate your efforts in making this happen.  Please call the clinic at [phone] if your symptoms worsen or you have any concerns.  Thank you for allowing me to participate in your care, Gloriann Ogren, MD 11/03/2023, 11:27 AM PGY-1, [facility]

## 2023-11-03 NOTE — Assessment & Plan Note (Signed)
 Will order A1C today

## 2023-11-04 LAB — HEMOGLOBIN A1C
Est. average glucose Bld gHb Est-mCnc: 126 mg/dL
Hgb A1c MFr Bld: 6 % — ABNORMAL HIGH (ref 4.8–5.6)

## 2023-11-04 LAB — LIPID PANEL
Chol/HDL Ratio: 3.2 {ratio} (ref 0.0–4.4)
Cholesterol, Total: 220 mg/dL — ABNORMAL HIGH (ref 100–199)
HDL: 68 mg/dL (ref >39)
LDL Chol Calc (NIH): 138 mg/dL — ABNORMAL HIGH (ref 0–99)
Triglycerides: 78 mg/dL (ref 0–149)
VLDL Cholesterol Cal: 14 mg/dL (ref 5–40)

## 2023-11-04 LAB — VITAMIN D 25 HYDROXY (VIT D DEFICIENCY, FRACTURES): Vit D, 25-Hydroxy: 22.2 ng/mL — ABNORMAL LOW (ref 30.0–100.0)

## 2023-11-06 ENCOUNTER — Telehealth: Payer: Self-pay | Admitting: Family Medicine

## 2023-11-06 NOTE — Telephone Encounter (Signed)
 Called patient to discuss lab results.  Vitamin D  level still low, advised patient to ensure she is taking her vitamin C supplementation daily.  Lipid panel indicative of hyperlipidemia.  Patient was instructed to restart ezetimibe  per cardiology but had not done so at visit.  Instructed patient to restart ezetimibe .  Patient expressed understanding.  All questions answered.

## 2023-11-24 ENCOUNTER — Ambulatory Visit (HOSPITAL_COMMUNITY)
Admission: EM | Admit: 2023-11-24 | Discharge: 2023-11-24 | Disposition: A | Discharge status: Home / Self Care | Payer: Medicare Other | Attending: Emergency Medicine | Admitting: Emergency Medicine

## 2023-11-24 ENCOUNTER — Telehealth: Payer: Self-pay

## 2023-11-24 ENCOUNTER — Ambulatory Visit (INDEPENDENT_AMBULATORY_CARE_PROVIDER_SITE_OTHER): Payer: Medicare Other

## 2023-11-24 ENCOUNTER — Encounter (HOSPITAL_COMMUNITY): Payer: Self-pay | Admitting: Emergency Medicine

## 2023-11-24 DIAGNOSIS — J439 Emphysema, unspecified: Secondary | ICD-10-CM | POA: Diagnosis present

## 2023-11-24 DIAGNOSIS — J849 Interstitial pulmonary disease, unspecified: Secondary | ICD-10-CM | POA: Diagnosis present

## 2023-11-24 DIAGNOSIS — J22 Unspecified acute lower respiratory infection: Secondary | ICD-10-CM

## 2023-11-24 DIAGNOSIS — J8410 Pulmonary fibrosis, unspecified: Secondary | ICD-10-CM | POA: Diagnosis present

## 2023-11-24 LAB — POCT INFLUENZA A/B
Influenza A, POC: NEGATIVE
Influenza B, POC: NEGATIVE

## 2023-11-24 LAB — POC RSV: RSV Antigen, POC: NEGATIVE

## 2023-11-24 NOTE — Discharge Instructions (Addendum)
Per the radiology report of your chest x-ray, you continue to have emphysema in your lungs.  You also have signs of a special lung condition called interstitial lung disease.  It is very important that you see a pulmonologist for further evaluation and treatment of both of these conditions.  They are causing you to feel very short of breath and there are good medications to treat this.  At this time, I recommend that you continue using your albuterol inhaler by inhaling 2 puffs every 6 hours.  Please reach out to your primary care provider to let them know that you would like to be seen by a pulmonologist so that appointment can be scheduled for you.  You have refills remaining at the [facility] on [address].  Thank you for visiting [facility] today.

## 2023-11-24 NOTE — Telephone Encounter (Signed)
Patients granddaughter calls nurse line requesting a walk in apt.   She reports patient has been short of breath for the last few days.   She denies any recent illness or chest pains.   She reports she is working hard to breath and reports she can not complete a full sentence.  She reports she is right outside of our office.  Granddaughter advised with symptoms we would want ger to go to the emergency department.   She agreed with plan.

## 2023-11-24 NOTE — ED Triage Notes (Signed)
Patient presents with dyspnea, dizziness, productive cough and c/o mucus in her stool x 5 days.

## 2023-11-24 NOTE — ED Provider Notes (Signed)
[facility]    CSN: [id number] Arrival date & time: 11/24/23  1537    HISTORY   Chief Complaint  Patient presents with   Shortness of Breath   Dizziness   Cough   HPI Donna Herrera is a pleasant, 78 y.o. female who presents to urgent care today. Patient complains of dyspnea, dizziness, cough productive of mucus body aches, headache, chills, fatigue for the past 5 days.  Patient has a history of congestive heart failure, interstitial lung disease and emphysema.  Patient is a former smoker, has a 27-pack-year history of smoking cigarettes.  Patient states she has had mucus in her stool ever since she quit smoking.  Patient denies known sick contacts, nausea, vomiting, diarrhea, sore throat, otalgia.  The history is provided by the patient.   Past Medical History:  Diagnosis Date   Adenomatous polyp 03/23/2020   Colonoscopy 2021 notable for adenomatous polyp. Per GI, no further follow up colonoscopies recommended given patient's age.   Allergy    Arthritis    Atherosclerosis of aorta (HCC) 07/15/2021   Candidiasis, mouth 10/19/2017   Chronic combined systolic and diastolic CHF (congestive heart failure) (HCC) 10/21/2015   Echo 3/16: EF 40%, diffuse HK, mild MR, moderate LAE, mild RAE, PASP 42 mmHg  //  Echo 12/16: Mild LVH, EF 35-40%, diffuse HK, Gr 2 DD, MAC, mild MR, mod LAE, normal RVSF, PASP 49 mmHg // Echo 5/19: EF 15-20, diff HK, Gr 2 DD, trivial AI, MAC, trivial MR, mod LAE, normal RVSF, mild TR, PASP 62 // Limited Echo 8/19: mod LVH, EF 40-45, diff HK, Gr 1 DD, MAC, mild LAE    Chronic left shoulder pain 04/10/2013   Chronic lung disease    Chronic pain    Chronic pain syndrome 07/20/2017   Confusion 06/08/2021   Depression 08/25/2016   DJD (degenerative joint disease) 10/12/2012   Multiple joint replacements by Dr. Leslee Home.    Dyshidrotic eczema 12/31/2014   Edentulous 07/19/2021   Emphysema lung (HCC) 03/02/2021   Long smoking history.  Noted on CT Low  Dose Chest: 01/2021: IMPRESSION: Chronic interstitial changes raise the question of pulmonary fibrosis. High-resolution chest CT may prove helpful to further evaluate as clinically warranted.Emphysema (ICD10-J43.9) and Aortic Atherosclerosis (ICD10-170.0)  Recommended pulmonology referral but patient declined. Recommend continued discussion and symptom monitor   Essential hypertension 07/20/2017   GERD (gastroesophageal reflux disease)    Gout    H/O slipped capital femoral epiphysis (SCFE)    Heart failure, left, with LVEF 41-49% (HCC) 10/21/2015   transthoracic echocardiogram 06/06/18: EF appears improved to 40-45% with diffuse hypokinesis  A. Echo 3/16: EF 40%, diffuse HK, mild MR, moderate LAE, mild RAE, PASP 42 mmHg  //  B. Echo 12/16: Mild LVH, EF 35-40%, diffuse HK, grade 2 diastolic dysfunction, MAC, mild MR, moderate LAE, normal RVSF, PASP 49 mmHg   History of total right hip replacement 04/02/2019   Followed closely with orthopedist Dr. Charlann Boxer. Patient is s/p total right hip replacement in 1980's with evidence of bone loss and osteopenia making reconstruction unlikely. Plan for conservative treatment at this time with crutches for ambulation and pain control.   Hyperlipidemia    Hypertension    Hypertensive heart disease with CHF (congestive heart failure) (HCC) 10/12/2012   Loss of taste 02/06/2018   Mass of breast, left 07/21/2014   NICM (nonischemic cardiomyopathy) (HCC) 06/09/2016   A. LHC 7/17: Normal coronary arteries, EF 30-35%, LVEDP 14 mmHg   Pain in gums 04/18/2018  Pre-diabetes    Pulmonary fibrosis (HCC) 03/02/2021   CT Low Dose Chest: 01/2021: IMPRESSION: Chronic interstitial changes raise the question of pulmonary fibrosis. High-resolution chest CT may prove helpful to further evaluate as clinically warranted.Emphysema (ICD10-J43.9) and Aortic Atherosclerosis (ICD10-170.0)  Recommended pulmonology referral but patient declined. Recommend continued discussion and symptom monitoring.    Pulmonary nodule 1 cm or greater in diameter 03/02/2021   CT low dose Chest: 01/2021: 1. Lung-RADS 4A, suspicious. 10 mm posterior left lower lobe pulmonary nodule, possibly scarring. Follow up low-dose chest CT without contrast in 3 months (please use the following order, "CT CHEST LCS NODULE FOLLOW-UP W/O CM") is recommended. Alternatively, PET may be considered when there is a solid component 8mm or larger.  Plan: Follow up low dose CT ordered in 3 mon   Rib pain on left side 09/17/2020   Right hip pain 11/02/2012   Tendonitis, Achilles, left 05/24/2017   Tobacco abuse 09/29/2014   Urge incontinence 07/19/2021   Vitamin D deficiency 12/31/2014   Patient Active Problem List   Diagnosis Date Noted   ILD (interstitial lung disease) (HCC) 09/19/2023   Hypertrophic toenail 04/04/2023   Hx of surgical fusion joint 12/23/2022   Mechanical failure of prosthetic joint, initial encounter (HCC) 12/23/2022   Osteoarthritis 10/26/2021   Urge incontinence 07/19/2021   Osteoarthritis of shoulders, bilateral 07/19/2021   mild cognitive impairment 07/19/2021   Atherosclerosis of aorta (HCC) 07/15/2021   Hallux valgus (acquired), left foot 06/08/2021   COPD (chronic obstructive pulmonary disease) (HCC) 03/02/2021   Pulmonary fibrosis (HCC) 03/02/2021   Pulmonary nodule 1 cm or greater in diameter 03/02/2021   Degenerative disc disease, cervical 11/26/2020   Rotator cuff arthropathy of both shoulders 11/26/2020   History of tobacco abuse 03/26/2020   Prediabetes 03/26/2020   Essential hypertension 07/20/2017   NICM (nonischemic cardiomyopathy) (HCC) 06/09/2016   HFmrEF (Heart Failure with mildly reduced EF) 10/21/2015   Vitamin D deficiency 12/31/2014   Hyperlipidemia 09/16/2013   Hip pain, right 11/02/2012   DJD (degenerative joint disease) 10/12/2012   Encounter for chronic pain management 10/12/2012   Gout 10/12/2012   GERD (gastroesophageal reflux disease) 10/12/2012   Hypertensive heart disease  with congestive heart failure (HCC) 10/12/2012   Past Surgical History:  Procedure Laterality Date   CARDIAC CATHETERIZATION  2005   normal-Hochrein   CARDIAC CATHETERIZATION N/A 05/11/2016   Procedure: Right/Left Heart Cath and Coronary Angiography;  Surgeon: Peter M Swaziland, MD;  Location: [facility] INVASIVE CV LAB;  Service: Cardiovascular;  Laterality: N/A;   CESAREAN SECTION     COLONOSCOPY  1990's ??   HIP FUSION Left    left    JOINT REPLACEMENT Bilateral 1991, 1997   [place]   ROTATOR CUFF REPAIR     bilateral   TOOTH EXTRACTION N/A 04/03/2020   Procedure: DENTAL RESTORATION/EXTRACTIONS;  Surgeon: Ocie Doyne, DDS;  Location: [facility];  Service: Oral Surgery;  Laterality: N/A;   TOTAL HIP ARTHROPLASTY  1987   right. Dr. Leslee Home   TUBAL LIGATION     OB History   No obstetric history on file.    Home Medications    Prior to Admission medications   Medication Sig Start Date End Date Taking? Authorizing Provider  acetaminophen (TYLENOL) 500 MG tablet Take 1,000 mg by mouth daily as needed for mild pain or moderate pain.    [provider]  albuterol (VENTOLIN HFA) 108 (90 Base) MCG/ACT inhaler Inhale 1-2 puffs into the lungs every 6 (six) hours as  needed for wheezing or shortness of breath. 05/16/23   Baloch, Mahnoor, MD  alendronate (FOSAMAX) 70 MG tablet Take 70 mg by mouth once a week. 09/19/23   [provider]  allopurinol (ZYLOPRIM) 100 MG tablet Take 1 tablet (100 mg total) by mouth daily. 08/01/23   Baloch, Mahnoor, MD  aspirin 81 MG tablet Take 1 tablet (81 mg total) by mouth daily. 04/16/15   Latrelle Dodrill, MD  baclofen (LIORESAL) 10 MG tablet TAKE 1 TABLET 3 TIMES DAILY. WILL REFILL FOR 2 MONTH SUPPLY, PLEASE SCHEDULE APPOINTMENT 12/07/22   Cora Collum, DO  carvedilol (COREG) 3.125 MG tablet TAKE 1 TABLET BY MOUTH 2 TIMES DAILY. 09/15/23   Baloch, Mahnoor, MD  Cholecalciferol ([facility] D3) 125 MCG (5000 UT) capsule Take 1 capsule (5,000 Units total)  by mouth daily. 08/01/23   Baloch, Mahnoor, MD  colchicine 0.6 MG tablet TAKE 1 TABLET BY MOUTH EVERY DAY 09/21/23   Baloch, Mahnoor, MD  DULoxetine (CYMBALTA) 30 MG capsule Take 1 capsule (30 mg total) by mouth daily. 08/01/23   Baloch, Mahnoor, MD  ezetimibe (ZETIA) 10 MG tablet TAKE 1 TABLET BY MOUTH EVERY DAY 09/25/23   Pricilla Riffle, MD  famotidine (PEPCID) 20 MG tablet TAKE 1 TABLET (20 MG TOTAL) BY MOUTH DAILY AS NEEDED FOR HEARTBURN OR INDIGESTION. 03/07/22   Idalia Needle, Lucas Mallow, DO  furosemide (LASIX) 20 MG tablet TAKE 20 MG TABLET BY MOUTH ON Monday, WED, FRI 01/13/22   Pricilla Riffle, MD  gabapentin (NEURONTIN) 300 MG capsule Take 1 capsule (300 mg total) by mouth at bedtime as needed. TAKE 1 CAP BY MOUTH EVERYDAY AT BEDTIME. 08/01/23   Baloch, Mahnoor, MD  hydrALAZINE (APRESOLINE) 50 MG tablet TAKE 1 TABLET BY MOUTH THREE TIMES A DAY 06/27/23   Pricilla Riffle, MD  lidocaine (LIDODERM) 5 % Place 1 patch onto the skin daily. Remove & Discard patch within 12 hours or as directed by MD 07/19/22   Cora Collum, DO  Multiple Vitamin (MULTIVITAMIN WITH MINERALS) TABS tablet Take 1 tablet by mouth daily.    [provider]  naloxone [facility]) nasal spray 4 mg/0.1 mL SMARTSIG:Both Nares 04/03/23   [provider]  oxyCODONE (OXY IR/ROXICODONE) 5 MG immediate release tablet Take 5 mg by mouth every 6 (six) hours as needed for moderate pain or severe pain. 04/03/23   [provider]  Potassium Chloride ER 20 MEQ TBCR Take 1 tablet (20 mEq total) by mouth daily. 06/26/23   Baloch, Mahnoor, MD  spironolactone (ALDACTONE) 25 MG tablet TAKE 1/2 TABLET BY MOUTH EVERY DAY 09/15/23   Baloch, Mahnoor, MD  Vitamin D, Ergocalciferol, (DRISDOL) 1.25 MG (50000 UNIT) CAPS capsule Take 50,000 Units by mouth once a week. 08/01/23   [provider]    Family History Family History  Problem Relation Age of Onset   Depression Mother    Diabetes Mother    Hypertension Mother    Stroke  Mother    Heart disease Mother    Cancer Father        unknown primary   Diabetes Sister    Stomach cancer Brother    Throat cancer Son    Breast cancer Neg Hx    Esophageal cancer Neg Hx    Colon cancer Neg Hx    Colon polyps Neg Hx    Social History Social History   Tobacco Use   Smoking status: Former    Current packs/day: 0.00    Average  packs/day: 0.5 packs/day for 55.0 years (27.5 ttl pk-yrs)    Types: Cigarettes    Start date: 67    Quit date: 2017    Years since quitting: 8.0    Passive exposure: Past   Smokeless tobacco: Never  Vaping Use   Vaping status: Never Used  Substance Use Topics   Alcohol use: Not Currently    Comment: on holidays   Drug use: No   Allergies   Crab [shellfish allergy], Morphine and codeine, and Atorvastatin  Review of Systems Review of Systems Pertinent findings revealed after performing a 14 point review of systems has been noted in the history of present illness.  Physical Exam Vital Signs BP (!) 136/96 (BP Location: Right Arm)   Pulse (!) 58   Temp (!) 97.5 F (36.4 C) (Oral)   Resp 20   SpO2 96%   No data found.  Physical Exam Vitals and nursing note reviewed.  Constitutional:      General: She is awake. She is not in acute distress.    Appearance: Normal appearance. She is well-developed. She is not ill-appearing.  HENT:     Head: Normocephalic and atraumatic.     Salivary Glands: Right salivary gland is not diffusely enlarged or tender. Left salivary gland is not diffusely enlarged or tender.     Right Ear: Hearing, tympanic membrane, ear canal and external ear normal. No drainage. No middle ear effusion. There is no impacted cerumen. Tympanic membrane is not erythematous or bulging.     Left Ear: Hearing, tympanic membrane, ear canal and external ear normal. No drainage.  No middle ear effusion. There is no impacted cerumen. Tympanic membrane is not erythematous or bulging.     Nose: Nose normal. No nasal  deformity, septal deviation, mucosal edema, congestion or rhinorrhea.     Right Turbinates: Enlarged and swollen. Not pale.     Left Turbinates: Enlarged and swollen. Not pale.     Right Sinus: No maxillary sinus tenderness or frontal sinus tenderness.     Left Sinus: No maxillary sinus tenderness or frontal sinus tenderness.     Mouth/Throat:     Lips: Pink. No lesions.     Mouth: Mucous membranes are moist. No oral lesions.     Tongue: No lesions.     Pharynx: Oropharynx is clear. Uvula midline. No pharyngeal swelling, oropharyngeal exudate, posterior oropharyngeal erythema, uvula swelling or postnasal drip.     Tonsils: No tonsillar exudate. 0 on the right. 0 on the left.  Eyes:     General: Lids are normal.        Right eye: No discharge.        Left eye: No discharge.     Extraocular Movements: Extraocular movements intact.     Conjunctiva/sclera: Conjunctivae normal.     Right eye: Right conjunctiva is not injected.     Left eye: Left conjunctiva is not injected.     Pupils: Pupils are equal, round, and reactive to light.  Neck:     Trachea: Trachea and phonation normal.  Cardiovascular:     Rate and Rhythm: Regular rhythm. Bradycardia present.     Pulses: Normal pulses.     Heart sounds: Normal heart sounds. No murmur heard.    No friction rub. No gallop.  Pulmonary:     Effort: Pulmonary effort is normal. No tachypnea, bradypnea, accessory muscle usage, prolonged expiration, respiratory distress or retractions.     Breath sounds: Normal air entry. No stridor, decreased  air movement or transmitted upper airway sounds. Examination of the left-upper field reveals decreased breath sounds. Examination of the left-middle field reveals decreased breath sounds. Decreased breath sounds present. No wheezing, rhonchi or rales.  Chest:     Chest wall: No tenderness.  Musculoskeletal:        General: Normal range of motion.     Cervical back: Full passive range of motion without pain,  normal range of motion and neck supple. Normal range of motion.  Lymphadenopathy:     Cervical: Cervical adenopathy present.     Right cervical: Superficial cervical adenopathy present.     Left cervical: Superficial cervical adenopathy present.  Skin:    General: Skin is warm and dry.     Findings: No erythema or rash.  Neurological:     General: No focal deficit present.     Mental Status: She is alert and oriented to person, place, and time.  Psychiatric:        Mood and Affect: Mood normal.        Behavior: Behavior normal. Behavior is cooperative.     Visual Acuity Right Eye Distance:   Left Eye Distance:   Bilateral Distance:    Right Eye Near:   Left Eye Near:    Bilateral Near:     [facility] Couse / Diagnostics / Procedures:     Radiology DG Chest 2 View Result Date: 11/24/2023 CLINICAL DATA:  Acute on chronic shortness of breath EXAM: CHEST - 2 VIEW COMPARISON:  X-ray 09/28/2023 and older. FINDINGS: Slight elevation of the right hemidiaphragm. No consolidation, pneumothorax or effusion. No edema. Calcified aorta. There is some interstitial changes seen along the lungs which are stable. Degenerative changes seen along the spine. IMPRESSION: Elevated right hemidiaphragm with chronic lung changes. No consolidation. Electronically Signed   By: Karen Kays M.D.   On: 11/24/2023 17:28    Procedures Procedures (including critical care time) EKG  Pending results:  Labs Reviewed  SARS CORONAVIRUS 2 (TAT 6-24 HRS)  POC RSV  POCT INFLUENZA A/B    Medications Ordered in UC: Medications - No data to display  UC Diagnoses / Final Clinical Impressions(s)   I have reviewed the triage vital signs and the nursing notes.  Pertinent labs & imaging results that were available during my care of the patient were reviewed by me and considered in my medical decision making (see chart for details).    Final diagnoses:  ILD (interstitial lung disease) (HCC)  Pulmonary fibrosis (HCC)   Pulmonary emphysema, unspecified emphysema type (HCC)   Rapid influenza and RSV tests are all negative.  COVID-19 test is in process, we will notify her of the result once it is complete.  Due to the duration of her symptoms, patient would no longer benefit from Paxlovid if her COVID-19 test is positive. Patient advised if radiologist interpretation of her x-ray.  Patient advised to continue albuterol and follow-up with her PCP for renewal of her referral to pulmonology which she has previously declined.  Patient advised to continue using albuterol 4 times daily until she is seen by pulmonology.  Please see discharge instructions below for details of plan of care as provided to patient. ED Prescriptions   None    PDMP not reviewed this encounter.  Pending results:  Labs Reviewed  SARS CORONAVIRUS 2 (TAT 6-24 HRS)  POC RSV  POCT INFLUENZA A/B      Discharge Instructions      Per the radiology report of your chest  x-ray, you continue to have emphysema in your lungs.  You also have signs of a special lung condition called interstitial lung disease.  It is very important that you see a pulmonologist for further evaluation and treatment of both of these conditions.  They are causing you to feel very short of breath and there are good medications to treat this.  At this time, I recommend that you continue using your albuterol inhaler by inhaling 2 puffs every 6 hours.  Please reach out to your primary care provider to let them know that you would like to be seen by a pulmonologist so that appointment can be scheduled for you.  [facility] on [address].  Thank you for visiting [facility] today.      Disposition Upon Discharge:  Condition: stable for discharge home  Patient presented with an acute illness with associated systemic symptoms and significant discomfort requiring urgent management. In my opinion, this is a condition  that a prudent lay person (someone who possesses an average knowledge of health and medicine) may potentially expect to result in complications if not addressed urgently such as respiratory distress, impairment of bodily function or dysfunction of bodily organs.   Routine symptom specific, illness specific and/or disease specific instructions were discussed with the patient and/or caregiver at length.   As such, the patient has been evaluated and assessed, work-up was performed and treatment was provided in alignment with urgent care protocols and evidence based medicine.  Patient/parent/caregiver has been advised that the patient may require follow up for further testing and treatment if the symptoms continue in spite of treatment, as clinically indicated and appropriate.  Patient/parent/caregiver has been advised to return to the [facility] or PCP if no better; to PCP or the Emergency Department if new signs and symptoms develop, or if the current signs or symptoms continue to change or worsen for further workup, evaluation and treatment as clinically indicated and appropriate  The patient will follow up with their current PCP if and as advised. If the patient does not currently have a PCP we will assist them in obtaining one.   The patient may need specialty follow up if the symptoms continue, in spite of conservative treatment and management, for further workup, evaluation, consultation and treatment as clinically indicated and appropriate.  Patient/parent/caregiver verbalized understanding and agreement of plan as discussed.  All questions were addressed during visit.  Please see discharge instructions below for further details of plan.  This office note has been dictated using Teaching laboratory technician.  Unfortunately, this method of dictation can sometimes lead to typographical or grammatical errors.  I apologize for your inconvenience in advance if this occurs.  Please do not hesitate to  reach out to me if clarification is needed.      Theadora Rama Scales, PA-C 11/24/23 1742

## 2023-11-25 LAB — SARS CORONAVIRUS 2 (TAT 6-24 HRS): SARS Coronavirus 2: NEGATIVE

## 2023-11-28 ENCOUNTER — Ambulatory Visit: Payer: Medicare Other | Admitting: Family Medicine

## 2023-12-01 ENCOUNTER — Other Ambulatory Visit: Payer: Self-pay

## 2023-12-01 ENCOUNTER — Encounter (HOSPITAL_COMMUNITY): Payer: Self-pay | Admitting: Emergency Medicine

## 2023-12-01 ENCOUNTER — Emergency Department (HOSPITAL_COMMUNITY): Payer: Medicare Other

## 2023-12-01 ENCOUNTER — Ambulatory Visit (INDEPENDENT_AMBULATORY_CARE_PROVIDER_SITE_OTHER): Payer: Medicare Other | Admitting: Family Medicine

## 2023-12-01 ENCOUNTER — Inpatient Hospital Stay (HOSPITAL_COMMUNITY)
Admission: EM | Admit: 2023-12-01 | Discharge: 2023-12-08 | DRG: 308 | Disposition: A | Discharge status: Home Health | Payer: Medicare Other | Attending: Family Medicine | Admitting: Family Medicine

## 2023-12-01 VITALS — BP 110/69 | HR 80 | Ht 65.0 in | Wt 171.8 lb

## 2023-12-01 DIAGNOSIS — Z7982 Long term (current) use of aspirin: Secondary | ICD-10-CM

## 2023-12-01 DIAGNOSIS — R001 Bradycardia, unspecified: Secondary | ICD-10-CM | POA: Diagnosis not present

## 2023-12-01 DIAGNOSIS — M79604 Pain in right leg: Secondary | ICD-10-CM | POA: Insufficient documentation

## 2023-12-01 DIAGNOSIS — I9589 Other hypotension: Secondary | ICD-10-CM | POA: Diagnosis not present

## 2023-12-01 DIAGNOSIS — F32A Depression, unspecified: Secondary | ICD-10-CM | POA: Diagnosis present

## 2023-12-01 DIAGNOSIS — G629 Polyneuropathy, unspecified: Secondary | ICD-10-CM | POA: Diagnosis present

## 2023-12-01 DIAGNOSIS — I5021 Acute systolic (congestive) heart failure: Secondary | ICD-10-CM | POA: Insufficient documentation

## 2023-12-01 DIAGNOSIS — I4821 Permanent atrial fibrillation: Secondary | ICD-10-CM | POA: Diagnosis present

## 2023-12-01 DIAGNOSIS — I5030 Unspecified diastolic (congestive) heart failure: Secondary | ICD-10-CM | POA: Insufficient documentation

## 2023-12-01 DIAGNOSIS — Z96653 Presence of artificial knee joint, bilateral: Secondary | ICD-10-CM | POA: Diagnosis present

## 2023-12-01 DIAGNOSIS — M25551 Pain in right hip: Secondary | ICD-10-CM | POA: Insufficient documentation

## 2023-12-01 DIAGNOSIS — Z800 Family history of malignant neoplasm of digestive organs: Secondary | ICD-10-CM

## 2023-12-01 DIAGNOSIS — I4891 Unspecified atrial fibrillation: Principal | ICD-10-CM

## 2023-12-01 DIAGNOSIS — J849 Interstitial pulmonary disease, unspecified: Secondary | ICD-10-CM | POA: Diagnosis present

## 2023-12-01 DIAGNOSIS — Z8249 Family history of ischemic heart disease and other diseases of the circulatory system: Secondary | ICD-10-CM

## 2023-12-01 DIAGNOSIS — I10 Essential (primary) hypertension: Secondary | ICD-10-CM | POA: Diagnosis present

## 2023-12-01 DIAGNOSIS — Z818 Family history of other mental and behavioral disorders: Secondary | ICD-10-CM

## 2023-12-01 DIAGNOSIS — M19011 Primary osteoarthritis, right shoulder: Secondary | ICD-10-CM | POA: Diagnosis present

## 2023-12-01 DIAGNOSIS — Z96641 Presence of right artificial hip joint: Secondary | ICD-10-CM | POA: Diagnosis present

## 2023-12-01 DIAGNOSIS — M79672 Pain in left foot: Secondary | ICD-10-CM | POA: Diagnosis present

## 2023-12-01 DIAGNOSIS — K219 Gastro-esophageal reflux disease without esophagitis: Secondary | ICD-10-CM | POA: Diagnosis present

## 2023-12-01 DIAGNOSIS — I4819 Other persistent atrial fibrillation: Principal | ICD-10-CM | POA: Diagnosis present

## 2023-12-01 DIAGNOSIS — I700 Atherosclerosis of aorta: Secondary | ICD-10-CM | POA: Diagnosis present

## 2023-12-01 DIAGNOSIS — I081 Rheumatic disorders of both mitral and tricuspid valves: Secondary | ICD-10-CM | POA: Diagnosis present

## 2023-12-01 DIAGNOSIS — Z860101 Personal history of adenomatous and serrated colon polyps: Secondary | ICD-10-CM

## 2023-12-01 DIAGNOSIS — R0602 Shortness of breath: Secondary | ICD-10-CM

## 2023-12-01 DIAGNOSIS — J439 Emphysema, unspecified: Secondary | ICD-10-CM | POA: Diagnosis present

## 2023-12-01 DIAGNOSIS — Z91013 Allergy to seafood: Secondary | ICD-10-CM

## 2023-12-01 DIAGNOSIS — I5023 Acute on chronic systolic (congestive) heart failure: Secondary | ICD-10-CM | POA: Diagnosis present

## 2023-12-01 DIAGNOSIS — I509 Heart failure, unspecified: Secondary | ICD-10-CM

## 2023-12-01 DIAGNOSIS — I5082 Biventricular heart failure: Secondary | ICD-10-CM | POA: Diagnosis present

## 2023-12-01 DIAGNOSIS — E559 Vitamin D deficiency, unspecified: Secondary | ICD-10-CM | POA: Diagnosis present

## 2023-12-01 DIAGNOSIS — F419 Anxiety disorder, unspecified: Secondary | ICD-10-CM | POA: Diagnosis present

## 2023-12-01 DIAGNOSIS — I272 Pulmonary hypertension, unspecified: Secondary | ICD-10-CM | POA: Diagnosis present

## 2023-12-01 DIAGNOSIS — Z87891 Personal history of nicotine dependence: Secondary | ICD-10-CM

## 2023-12-01 DIAGNOSIS — Z888 Allergy status to other drugs, medicaments and biological substances status: Secondary | ICD-10-CM

## 2023-12-01 DIAGNOSIS — D6869 Other thrombophilia: Secondary | ICD-10-CM | POA: Diagnosis present

## 2023-12-01 DIAGNOSIS — M109 Gout, unspecified: Secondary | ICD-10-CM | POA: Diagnosis present

## 2023-12-01 DIAGNOSIS — D649 Anemia, unspecified: Secondary | ICD-10-CM | POA: Insufficient documentation

## 2023-12-01 DIAGNOSIS — N179 Acute kidney failure, unspecified: Secondary | ICD-10-CM | POA: Insufficient documentation

## 2023-12-01 DIAGNOSIS — I4811 Longstanding persistent atrial fibrillation: Secondary | ICD-10-CM | POA: Diagnosis present

## 2023-12-01 DIAGNOSIS — Z885 Allergy status to narcotic agent status: Secondary | ICD-10-CM

## 2023-12-01 DIAGNOSIS — R000 Tachycardia, unspecified: Secondary | ICD-10-CM | POA: Diagnosis present

## 2023-12-01 DIAGNOSIS — I428 Other cardiomyopathies: Secondary | ICD-10-CM | POA: Diagnosis present

## 2023-12-01 DIAGNOSIS — Z833 Family history of diabetes mellitus: Secondary | ICD-10-CM

## 2023-12-01 DIAGNOSIS — E861 Hypovolemia: Secondary | ICD-10-CM

## 2023-12-01 DIAGNOSIS — R7303 Prediabetes: Secondary | ICD-10-CM | POA: Diagnosis present

## 2023-12-01 DIAGNOSIS — E785 Hyperlipidemia, unspecified: Secondary | ICD-10-CM | POA: Diagnosis present

## 2023-12-01 DIAGNOSIS — I110 Hypertensive heart disease with heart failure: Secondary | ICD-10-CM | POA: Diagnosis present

## 2023-12-01 DIAGNOSIS — I4892 Unspecified atrial flutter: Secondary | ICD-10-CM | POA: Diagnosis present

## 2023-12-01 DIAGNOSIS — I493 Ventricular premature depolarization: Secondary | ICD-10-CM | POA: Diagnosis present

## 2023-12-01 DIAGNOSIS — Z808 Family history of malignant neoplasm of other organs or systems: Secondary | ICD-10-CM

## 2023-12-01 DIAGNOSIS — M247 Protrusio acetabuli: Secondary | ICD-10-CM | POA: Diagnosis present

## 2023-12-01 DIAGNOSIS — J8410 Pulmonary fibrosis, unspecified: Secondary | ICD-10-CM | POA: Diagnosis present

## 2023-12-01 DIAGNOSIS — Z7901 Long term (current) use of anticoagulants: Secondary | ICD-10-CM

## 2023-12-01 DIAGNOSIS — M19012 Primary osteoarthritis, left shoulder: Secondary | ICD-10-CM | POA: Diagnosis present

## 2023-12-01 DIAGNOSIS — Z823 Family history of stroke: Secondary | ICD-10-CM

## 2023-12-01 DIAGNOSIS — G894 Chronic pain syndrome: Secondary | ICD-10-CM | POA: Diagnosis present

## 2023-12-01 DIAGNOSIS — M79671 Pain in right foot: Secondary | ICD-10-CM | POA: Diagnosis present

## 2023-12-01 DIAGNOSIS — M79605 Pain in left leg: Secondary | ICD-10-CM | POA: Diagnosis present

## 2023-12-01 DIAGNOSIS — Z79899 Other long term (current) drug therapy: Secondary | ICD-10-CM

## 2023-12-01 LAB — BASIC METABOLIC PANEL
Anion gap: 12 (ref 5–15)
BUN: 13 mg/dL (ref 8–23)
CO2: 22 mmol/L (ref 22–32)
Calcium: 9.1 mg/dL (ref 8.9–10.3)
Chloride: 103 mmol/L (ref 98–111)
Creatinine, Ser: 0.78 mg/dL (ref 0.44–1.00)
GFR, Estimated: >60 mL/min (ref >60)
Glucose, Bld: 87 mg/dL (ref 70–99)
Potassium: 4 mmol/L (ref 3.5–5.1)
Sodium: 137 mmol/L (ref 135–145)

## 2023-12-01 LAB — CBC
HCT: 34.4 % — ABNORMAL LOW (ref 36.0–46.0)
Hemoglobin: 10.5 g/dL — ABNORMAL LOW (ref 12.0–15.0)
MCH: 27.7 pg (ref 26.0–34.0)
MCHC: 30.5 g/dL (ref 30.0–36.0)
MCV: 90.8 fL (ref 80.0–100.0)
Platelets: 265 10*3/uL (ref 150–400)
RBC: 3.79 MIL/uL — ABNORMAL LOW (ref 3.87–5.11)
RDW: 15.7 % — ABNORMAL HIGH (ref 11.5–15.5)
WBC: 4.4 10*3/uL (ref 4.0–10.5)
nRBC: 0 % (ref 0.0–0.2)

## 2023-12-01 LAB — BRAIN NATRIURETIC PEPTIDE: B Natriuretic Peptide: 495.9 pg/mL — ABNORMAL HIGH (ref 0.0–100.0)

## 2023-12-01 LAB — TROPONIN I (HIGH SENSITIVITY)
Troponin I (High Sensitivity): 16 ng/L (ref <18)
Troponin I (High Sensitivity): 17 ng/L (ref <18)

## 2023-12-01 LAB — MAGNESIUM: Magnesium: 2 mg/dL (ref 1.7–2.4)

## 2023-12-01 NOTE — ED Provider Notes (Signed)
[facility] Emergency Department Provider Note MRN:  [id number]  Arrival date & time: 12/01/23     Chief Complaint   Shortness of Breath   History of Present Illness   Donna Herrera is a 78 y.o. year-old female presents to the ED with chief complaint of SOB x 2 weeks.  Has been gradually worsening. Positive nocturnal orthopnea.  Doesn't use a CPAP.  Has had worsening leg swelling.  Dyspnea with exertion. States she has had some dizziness.  Denies fevers or chills.  History provided by patient.   Review of Systems  Pertinent positive and negative review of systems noted in HPI.    Physical Exam   Vitals:   12/01/23 2234 12/01/23 2311  BP: (!) 135/116   Pulse: 68   Resp: 14   Temp:  98.2 F (36.8 C)  SpO2: 100%     CONSTITUTIONAL:  non toxic-appearing, NAD NEURO:  Alert and oriented x 3, CN 3-12 grossly intact EYES:  eyes equal and reactive ENT/NECK:  Supple, no stridor  CARDIO:  normal rate, irregularly irregular rhythm, appears well-perfused  PULM:  No respiratory distress, CTAB GI/GU:  non-distended,  MSK/SPINE:  No gross deformities, no LE edema, moves all extremities  SKIN:  no rash, atraumatic   *Additional and/or pertinent findings included in MDM below  Diagnostic and Interventional Summary    EKG Interpretation Date/Time:  Friday December 01 2023 15:06:16 EST Ventricular Rate:  86 PR Interval:    QRS Duration:  98 QT Interval:  404 QTC Calculation: 483 R Axis:   -14  Text Interpretation: Atrial fibrillation with premature ventricular or aberrantly conducted complexes Cannot rule out Anterior infarct , age undetermined Abnormal ECG When compared with ECG of 24-Jan-2023 15:48,  afib is new Confirmed by Benjiman Core [phone]) on 12/01/2023 10:44:47 PM       Labs Reviewed  CBC - Abnormal; Notable for the following components:      Result Value   RBC 3.79 (*)    Hemoglobin 10.5 (*)    HCT 34.4 (*)    RDW 15.7 (*)     All other components within normal limits  BRAIN NATRIURETIC PEPTIDE - Abnormal; Notable for the following components:   B Natriuretic Peptide 495.9 (*)    All other components within normal limits  BASIC METABOLIC PANEL  MAGNESIUM  TROPONIN I (HIGH SENSITIVITY)  TROPONIN I (HIGH SENSITIVITY)    DG Chest 2 View  Final Result      Medications - No data to display   Procedures  /  Critical Care Procedures  ED Course and Medical Decision Making  I have reviewed the triage vital signs, the nursing notes, and pertinent available records from the EMR.  Social Determinants Affecting Complexity of Care: Patient has no clinically significant social determinants affecting this chief complaint..   ED Course:    Medical Decision Making Patient here with SOB that has been worsening for the past 2 weeks.  Sent in [facility].    EKG notable for a-fib, which appears to be new.  Rates are in the 80s-120s, but mainly staying in the 80s-90s.  This is likely causing her worsening CHF symptoms.  Trops are flat at 16 and 17.  No chest pain.  Doubt ACS.  BNP is up a little, but better than previous.  CXR negative, but she does have bilateral pitting edema.  Amount and/or Complexity of Data Reviewed Labs: ordered. Decision-making details documented in ED Course.  Details: Details above Radiology: ordered and independent interpretation performed.    Details: Details above  Risk Decision regarding hospitalization.         Consultants: I consulted with the [facility], who ar appreciated for coming to admit the patient.   Treatment and Plan: Patient's exam and diagnostic results are concerning for new onset A-fib with CHF exacerbation.  Feel that patient will need admission to the hospital for further treatment and evaluation.  Patient discussed with attending physician, Dr. Rubin Payor, who agrees with plan.  Final Clinical Impressions(s) / ED  Diagnoses     ICD-10-CM   1. New onset a-fib (HCC)  I48.91     2. Acute on chronic congestive heart failure, unspecified heart failure type [facility])  I50.9       ED Discharge Orders     None         Discharge Instructions Discussed with and Provided to Patient:   Discharge Instructions   None      Roxy Horseman, PA-C 12/01/23 2316    Benjiman Core, MD 12/01/23 [phone]

## 2023-12-01 NOTE — Progress Notes (Signed)
    SUBJECTIVE:   CHIEF COMPLAINT / HPI:   Patient is here for follow-up regarding recent urgent care visit.  She went to urgent care on 1/24 for dyspnea, dizziness, mucus production with sick symptoms.  Chest x-ray was done which showed chronic lung changes but no consolidation ordered edema.  Respiratory viral panel was negative.  Given her history of pulmonary fibrosis, urgent care recommended that she follow-up with a pulmonologist for further evaluation and treatment and use her albuterol inhaler 4 times daily until then.  Since then she has been feeling worse. Has had increased shortness of breath, gasping for air at night while laying down, working harder to breath, headache, runny nose, increased mucous production, decreased appetite. She has also noted more swelling in both of her legs. She took lasix yesterday and has been using albuterol around the clock. She thinks both of these help.    PERTINENT  PMH / PSH:  COPD Hypertension HFrEF Hyperlipidemia   OBJECTIVE:   BP 110/69   Pulse 80   Ht 5\' 5"  (1.651 m)   Wt 171 lb 12.8 oz (77.9 kg)   SpO2 95%   BMI 28.59 kg/m   General: A&O, NAD,  HEENT: No sign of trauma, EOM grossly intact Cardiac: RRR, no m/r/g Respiratory: normal WOB on RA, crackles at bilateral lung bases worse on left side GI: Soft, NTTP, non-distended  Extremities: 1+ pitting edema to bilateral lower extremities up to mid shin.  Neuro: slowed gait with assistance from cane, no focal deficits  Psych: Appropriate mood and affect   ASSESSMENT/PLAN:   Assessment & Plan Shortness of breath Given 2 week history of shortness of breath with associated sick symptoms, concern for COPD exacerbation vs CHF exacerbation. Pulse ox 94-99% with ambulation, which is reassuring. However, given orthopnea and worsening condition over past two weeks, recommend patient be evaluated in the ED. Will send patient to the ED for further evaluation for CHF exacerbation vs COPD  excerebration. Discussed plan with patient and sister who will transport patient to [facility] ED.   [name], MD [facility]

## 2023-12-01 NOTE — ED Provider Triage Note (Signed)
Emergency Medicine Provider Triage Evaluation Note  Donna Herrera , a 79 y.o. female  was evaluated in triage.  Pt complains of SOB.  Review of Systems  Positive:  Negative:   Physical Exam  BP (!) 124/93 (BP Location: Right Arm)   Pulse (!) 52   Temp 98.2 F (36.8 C) (Oral)   Resp 17   Ht 5\' 5"  (1.651 m)   Wt 77 kg   SpO2 96%   BMI 28.25 kg/m  Gen:   Awake, no distress   Resp:  Normal effort  MSK:   Moves extremities without difficulty  Other:   Medical Decision Making  Medically screening exam initiated at 3:26 PM.  Appropriate orders placed.  Donna Herrera was informed that the remainder of the evaluation will be completed by another provider, this initial triage assessment does not replace that evaluation, and the importance of remaining in the ED until their evaluation is complete.  Patient with progressive SOB, dizziness, BL leg swelling x2 weeks. PCP concerned for CHF vs COPD exacerbation.   Dorthy Cooler, [place] 12/01/23 [phone]

## 2023-12-01 NOTE — Hospital Course (Signed)
Donna Herrera is a 78 y.o.female with a history of COPD, HFrEF, HTN, HLD who was admitted to the [facility] Medicine Teaching Service at [facility] for new onset A-fib with worsening SOB . Her hospital course is detailed below:  - worsening SOB x2 weeks - found to be in Afib rates in 80-90s, but jumps up to the 120s for a few moments could be contributing to her worsening SOB - 2+ in lower extremities, no diuretics.   Other chronic conditions were medically managed with home medications and formulary alternatives as necessary (***)  PCP Follow-up Recommendations:

## 2023-12-01 NOTE — Patient Instructions (Signed)
It was great to see you today! Thank you for choosing [facility] for your primary care. Donna Herrera was seen for urgent care follow-up for shortness of breath.  Today we addressed: I think you need to be further evaluated in the emergency room. We are sending you over and letting them know you are coming  For your lung condition, we have already reached out to pulmonology and they should be calling you with details about an appointment.  In the meantime continue using your albuterol as needed  If you haven't already, sign up for My Chart to have easy access to your labs results, and communication with your primary care physician.  We are checking some labs today. If they are abnormal, I will call you. If they are normal, I will send you a MyChart message (if it is active) or a letter in the mail. If you do not hear about your labs in the next 2 weeks, please call the office.   You should return to our clinic as needed  I recommend that you always bring your medications to each appointment as this makes it easy to ensure you are on the correct medications and helps [name] not miss refills when you need them.  Please arrive 15 minutes before your appointment to ensure smooth check in process.  We appreciate your efforts in making this happen.  Please call the clinic at [phone] if your symptoms worsen or you have any concerns.  Thank you for allowing me to participate in your care, Donna Morales, MD 12/01/2023, 1:32 PM PGY-1, [facility]

## 2023-12-01 NOTE — H&P (Shared)
Hospital Admission History and Physical Service Pager: [phone]  Patient name: Donna Herrera Medical record number: [id number] Date of Birth: [birth year] Age: 78 y.o. Gender: female  Primary Care Provider: Penne Lash, MD Consultants: None Code Status: Full Code Preferred Emergency Contact:  Contact Information     Name Relation Home Work [place] Granddaughter [phone]  [phone]   [facility] Daughter   [phone]   Chief Complaint: Dyspnea  New Onset AFib  Assessment and Plan: Donna Herrera is a 78 y.o. female with past medical hx of Aortic atherosclerosis, high fall risk, COPD? Cervical DDD, DJD, GERD, CHF, NICM, ambulates with crutches, vit D deficiency, prediabetes, OA, pulmonary fibrosis, history of hip replacement presenting with worsening dyspnea x2 weeks. Differential for presentation of this includes: New onset A-fib: EKG with A-fib. HR flucuating from 50-100s. Upon sitting up in room for exam HR accelerated to the 150s, but quickly resolved. CHF Exacerbation: Last Echo 01/2023 demonstrated LVEF 50-55%, no RWMA, with G1DD (impaired relaxation). Has 2+ BLE edema and took an extra dose of Lasix Thursday d/t worsening swelling, since she normally takes this MWF.  COPD: No formal dx of COPD but has a hx of cough and increased sputum production. She has also been using Albuterol QID since UC visit on 1/24. Monitor cardinal symptoms, may benefit from steroid +/- abx.  PE: Possible with increased WOB/SOB and tachycardia. Will rule out with CTPE. No hx of PE or DVT. Heparin drip until r/o then switch to DOAC.  ACS: No chest pain, mild chest tightness and dyspnea. Trops trended flat at 16 and 17. EKG without concerning ST changes, Afib.  URI: Possible with symptoms of cough and mucus production. RVP negative on 1/24 at [facility], will repeat to rule out URI.  Pneumothorax: CXR reassuring against this  Assessment & Plan A-fib [facility]) Patient  presented with worsening dyspnea over the past two weeks. EKG demonstrated new onset atrial fibrillation likely contributing to her SOB. RVP negative at [facility] on 1/24. CXR negative today for acute pathology. - Admit to FMTS, attending Dr. Manson Passey - Med-Tele, Vital signs per floor - Regular diet  - PT/OT to treat - Cardiac monitoring - Heparin, transition to DOAC as able - AM CBC, BMP, Mag, TSH - RVP to rule out URI - CTPE to rule out PE - Echo - Ambulatory pulse ox in AM - Follow up with cardiology OP vs IP as indicated - Follows with [facility]  CHF (congestive heart failure) (HCC)  NICM BNP 495.9. CTAB with no crackles, no JVD, and 2+ BLE to ankles on exam. Able to complete sentences without distress, saturating in upper 90s on RA, but does have orthopnea. On Coreg 3.125 mg BID, Lasix 20 mg MWF, and Spironolactone 12.5 mg daily at home. Consider diuresis as indicated.  - Daily weights - Strict I/O - Continue Coreg 3.125 mg BID and Lasix 20 mg MWF - Restart Spironolactone as BP tolerates  - Holding home ASA for now  Right hip pain S/p R total hip replacement. Has a hx of SCFE and DJD. Complains of R hip pain to light palpation. "Old fusion of L hip, R hip arthroplasty with acetabular protrusion & superior subluxation & bone resorption around the proximal femur." Follows orthopedics outpatient.  - R hip XR - Pain regimen: Lidocaine patch q24h. Tylenol for mild pain and Oxycodone 2.5-5 mg for moderate/severe pain, K pad as tolerated, consider Voltaren gel  - PT/OT - OOB as tolerated  Bilateral leg pain Likely  secondary to bilateral leg edema, however pain with calf squeeze R>L. Will rule out DVT. Could try ted hose but likely would not tolerate  - B/l DVT US Essential hypertension BP in the 120-140/70-110s. On Coreg 3.125 mg BID, Hydralazine 50 TID, and Spironolactone 12.5 mg. - Continue home Coreg 3.125 mg BID to help with BP and HR control - Restart home meds as indicated by  pressures  Chronic and Stable Problems:  COPD  Pulmonary Fibrosis?:  Never been formally diagnosed with COPD w/ PFT, but has a significant smoking hx. Low dose CT in the past demonstrated possibility of pulmonary fibrosis and emphysema. Uses Albuterol PRN, but recently has been using it around the clock. Reordered albuterol, consider steroid + abx if indicated.  Gout: On colchicine PRN and allopurinol daily. No recent flares. Continue allopurinol. Chronic Pain: Takes Oxycodone 5 mg q6h PRN at home. Added on Oxycodone 2.5-5mg  q6h PRN for moderate/severe pain and Lidocaine patch q24h. Anxiety/Depression: Continue Cymbalta 30 mg daily Vitamin D deficiency: Contine D3 5000u daily HLD: Continue Zetia 10 mg daily.  Lipid panel repeated 4 weeks ago demonstrated LDL 138. GERD: Continue Famotidine 20 mg daily PRN Neuropathy: Continue Gabapentin 300 mg nightly Prediabetes: A1c 4 weeks ago 6.0.   FEN/GI: Regular Diet VTE Prophylaxis: Heparin  Disposition: Med-Tele  History of Present Illness:  Donna Herrera is a 78 y.o. female presenting with 2 weeks of worsening SOB with orthopnea and worsening leg swelling. Endorses dyspnea on exertion. She saw Dr. Georg Ruddle yesterday and was advised to come to the ED for further evaluation for CHF vs COPD exacerbation.   Patient went to an UC on 1/24 with sick symptoms and had a negative RVP. She was advised to use her Albuterol QID and see her pulmonologist d/t hx of pulmonary fibrosis. CXR demonstrated chronic lung changes but no consolidation or edema.   Was seen by her PCP 1/31 for SOB and associated sick symptoms and was advised to be evaluated in the ED for CHF exacerbation vs COPD exacerbation. Normal oxygen saturation.   Endorses increased shortness of breath, chest tightness, mild lightheadedness, headaches, gasping for air at night while laying down, working harder to breath, runny nose, cough, increased mucus production, and decreased appetite. She  has also noted more swelling in both of her legs. She took lasix yesterday and has been using albuterol around the clock. She thinks both of these help.   Endorses R hip pain as well. Has a hx of R hip replacement which she reports some deterioration of.   Denies N/V/D, hematemesis or hematochezia.   In the ED, patient was hypertensive, tachycardic to the low 100s, but maintained good O2 saturations on RA. She continues to have SOB and was found to have new onset A-fib. On exam she has orthopnea and 2+ pitting edema per EDP. Troponins trended flat, BNP mildly elevated 495.9 (lower than her previous BNPs), and CXR negative. BMP is wnl and patient is hemodynamically table.   Review Of Systems: Per HPI with the following additions: as above  Pertinent Past Medical History: COPD? - no PFTs noted for true diagnosis HTN Gout HFrEF Pulmonary fibrosis noted on low dose CT SCFE Remainder reviewed in history tab.   Pertinent Past Surgical History: Cardiac cath B/l knee replacement R hip replacement Rotator cuff repair Remainder reviewed in history tab.   Pertinent Social History: Tobacco use: Former  - 50 year smoking history  Alcohol use: Occasional, no history of DT Other Substance use: None  Lives  with daughter and son-in-law, uses crutches to ambulate  Pertinent Family History: Mother: Depression, T2DM, HTN, Stroke, Heart disease Father: Cancer Sister: T2DM Brother: Stomach cancer Remainder reviewed in history tab.   Important Outpatient Medications: Tylenol 1000 mg q6h PRN Albuterol 1-2 puffs q6h PRN Allopurinol 100 mg daily  ASA 81 mg daily Coreg 3.125 mg BID Vit D3 5000u daily Colchicine 0.6 mg daily Cymbalta 30 mg daily Zetia 10 mg daily - takes every other day  Pepcid 20 mg PRN Lasix 20 mg MWF - Took on Monday, Wednesday, and Thursday  Neurontin 300 mg at bedtime PRN Hydralazine 50 mg TID Lidoderm 5% PRN MVI Naloxone  Oxycodone 5 mg q6h PRN  KCl 20 meq  daily Spironolactone 25 mg daily -  1/2 tab daily Remainder reviewed in medication history.   Objective: BP (!) 140/71   Pulse (!) 54   Temp 98.2 F (36.8 C) (Oral)   Resp (!) 26   Ht 5\' 5"  (1.651 m)   Wt 77 kg   SpO2 95%   BMI 28.25 kg/m  Exam: General: Awake and Alert in NAD HEENT: NCAT. Sclera anicteric. No rhinorrhea. No JVD. Cardiovascular: Irregularly irregular. No M/R/G. Respiratory: CTAB, normal WOB on RA. No wheezing, crackles, rhonchi, or diminished breath sounds. Able to complete full sentences without distress. Abdomen: Soft, non-tender, non-distended. Bowel sounds normoactive Extremities: 2+ BLE edma to ankles with TTP. R hip pain with mild palpation. TTP over R shoulder (chronic).  Skin: Warm and dry. Scars from b/l knee replacements noted.  Neuro: No focal neurological deficits.  Labs:  CBC BMET  Recent Labs  Lab 12/01/23 1514  WBC 4.4  HGB 10.5*  HCT 34.4*  PLT 265   Recent Labs  Lab 12/01/23 1514  NA 137  K 4.0  CL 103  CO2 22  BUN 13  CREATININE 0.78  GLUCOSE 87  CALCIUM 9.1    Trop: 16>17 Mag: 2.0 BNP: 495.9  EKG: Irregularly irregular rhythm. Rate 86. No ST elevation. Mild Qtc prolongation (483)  Imaging Studies Performed: CXR: Cardiomegaly without congestive failure.   Fortunato Curling, DO 12/02/2023, 12:58 AM PGY-1, [facility]  FPTS Intern pager: [phone], text pages welcome Secure chat group [facility] Teaching Service   Upper Level Addendum:  I have seen and evaluated this patient along with Dr. Fatima Blank and reviewed the above note, making necessary revisions as appropriate.  I agree with the medical decision making and physical exam as noted above.  Alfredo Martinez, MD PGY-3 [facility] Residency

## 2023-12-01 NOTE — Assessment & Plan Note (Signed)
BNP 495.9. CTAB with no crackles, no JVD, and 2+ BLE to ankles on exam. Able to complete sentences without distress, saturating in upper 90s on RA, but does have orthopnea. On Coreg 3.125 mg BID, Lasix 20 mg MWF, and Spironolactone 12.5 mg daily at home. Consider diuresis as indicated.  - Daily weights - Strict I/O - Continue Coreg 3.125 mg BID and Lasix 20 mg MWF - Restart Spironolactone as BP tolerates  - Holding home ASA for now

## 2023-12-01 NOTE — ED Triage Notes (Signed)
Pt complains of SOB, dizziness and bilateral leg swelling x 2 weeks. Pt states PCP advised to come to ER today.

## 2023-12-01 NOTE — Assessment & Plan Note (Signed)
Patient presented with worsneing SOB over the past two weeks. RVP negative at [facility] on 1/24. CXR negative today for acute pathology. EKG demonstrated new onset atrial fibrillation likely contributing to her SOB.  - Admit to FMTS, attending Dr. Manson Passey - Med-Tele, Vital signs per floor - Heart Healthy diet  - PT/OT to treat - VTE prophylaxis: Heparin - AM CBC/BMP - Echo - CTPE to rule out PE - Ambulatory pulse ox

## 2023-12-02 ENCOUNTER — Encounter (HOSPITAL_COMMUNITY): Payer: Self-pay | Admitting: Student

## 2023-12-02 ENCOUNTER — Inpatient Hospital Stay (HOSPITAL_COMMUNITY): Payer: Medicare Other

## 2023-12-02 DIAGNOSIS — I5023 Acute on chronic systolic (congestive) heart failure: Secondary | ICD-10-CM | POA: Diagnosis present

## 2023-12-02 DIAGNOSIS — K219 Gastro-esophageal reflux disease without esophagitis: Secondary | ICD-10-CM | POA: Diagnosis present

## 2023-12-02 DIAGNOSIS — M7989 Other specified soft tissue disorders: Secondary | ICD-10-CM

## 2023-12-02 DIAGNOSIS — I509 Heart failure, unspecified: Secondary | ICD-10-CM | POA: Diagnosis not present

## 2023-12-02 DIAGNOSIS — J8410 Pulmonary fibrosis, unspecified: Secondary | ICD-10-CM | POA: Diagnosis present

## 2023-12-02 DIAGNOSIS — I4892 Unspecified atrial flutter: Secondary | ICD-10-CM | POA: Diagnosis present

## 2023-12-02 DIAGNOSIS — E785 Hyperlipidemia, unspecified: Secondary | ICD-10-CM | POA: Diagnosis present

## 2023-12-02 DIAGNOSIS — I361 Nonrheumatic tricuspid (valve) insufficiency: Secondary | ICD-10-CM | POA: Diagnosis not present

## 2023-12-02 DIAGNOSIS — N179 Acute kidney failure, unspecified: Secondary | ICD-10-CM | POA: Diagnosis not present

## 2023-12-02 DIAGNOSIS — D6869 Other thrombophilia: Secondary | ICD-10-CM | POA: Diagnosis present

## 2023-12-02 DIAGNOSIS — M79605 Pain in left leg: Secondary | ICD-10-CM | POA: Insufficient documentation

## 2023-12-02 DIAGNOSIS — I4891 Unspecified atrial fibrillation: Secondary | ICD-10-CM | POA: Diagnosis present

## 2023-12-02 DIAGNOSIS — J449 Chronic obstructive pulmonary disease, unspecified: Secondary | ICD-10-CM | POA: Diagnosis not present

## 2023-12-02 DIAGNOSIS — I4811 Longstanding persistent atrial fibrillation: Secondary | ICD-10-CM | POA: Diagnosis present

## 2023-12-02 DIAGNOSIS — J439 Emphysema, unspecified: Secondary | ICD-10-CM | POA: Diagnosis present

## 2023-12-02 DIAGNOSIS — G629 Polyneuropathy, unspecified: Secondary | ICD-10-CM | POA: Diagnosis present

## 2023-12-02 DIAGNOSIS — Z7901 Long term (current) use of anticoagulants: Secondary | ICD-10-CM | POA: Diagnosis not present

## 2023-12-02 DIAGNOSIS — I700 Atherosclerosis of aorta: Secondary | ICD-10-CM | POA: Diagnosis present

## 2023-12-02 DIAGNOSIS — I959 Hypotension, unspecified: Secondary | ICD-10-CM | POA: Diagnosis not present

## 2023-12-02 DIAGNOSIS — I081 Rheumatic disorders of both mitral and tricuspid valves: Secondary | ICD-10-CM | POA: Diagnosis present

## 2023-12-02 DIAGNOSIS — R0609 Other forms of dyspnea: Secondary | ICD-10-CM

## 2023-12-02 DIAGNOSIS — M25551 Pain in right hip: Secondary | ICD-10-CM | POA: Insufficient documentation

## 2023-12-02 DIAGNOSIS — I340 Nonrheumatic mitral (valve) insufficiency: Secondary | ICD-10-CM | POA: Diagnosis not present

## 2023-12-02 DIAGNOSIS — I5041 Acute combined systolic (congestive) and diastolic (congestive) heart failure: Secondary | ICD-10-CM | POA: Diagnosis not present

## 2023-12-02 DIAGNOSIS — G894 Chronic pain syndrome: Secondary | ICD-10-CM | POA: Diagnosis present

## 2023-12-02 DIAGNOSIS — I4821 Permanent atrial fibrillation: Secondary | ICD-10-CM | POA: Diagnosis present

## 2023-12-02 DIAGNOSIS — I4819 Other persistent atrial fibrillation: Secondary | ICD-10-CM | POA: Diagnosis present

## 2023-12-02 DIAGNOSIS — R7303 Prediabetes: Secondary | ICD-10-CM | POA: Diagnosis present

## 2023-12-02 DIAGNOSIS — I272 Pulmonary hypertension, unspecified: Secondary | ICD-10-CM | POA: Diagnosis present

## 2023-12-02 DIAGNOSIS — D649 Anemia, unspecified: Secondary | ICD-10-CM | POA: Insufficient documentation

## 2023-12-02 DIAGNOSIS — Z79899 Other long term (current) drug therapy: Secondary | ICD-10-CM | POA: Diagnosis not present

## 2023-12-02 DIAGNOSIS — I110 Hypertensive heart disease with heart failure: Secondary | ICD-10-CM | POA: Diagnosis present

## 2023-12-02 DIAGNOSIS — I428 Other cardiomyopathies: Secondary | ICD-10-CM | POA: Diagnosis present

## 2023-12-02 DIAGNOSIS — M109 Gout, unspecified: Secondary | ICD-10-CM | POA: Diagnosis present

## 2023-12-02 DIAGNOSIS — J849 Interstitial pulmonary disease, unspecified: Secondary | ICD-10-CM | POA: Diagnosis present

## 2023-12-02 DIAGNOSIS — M79604 Pain in right leg: Secondary | ICD-10-CM | POA: Insufficient documentation

## 2023-12-02 DIAGNOSIS — I5082 Biventricular heart failure: Secondary | ICD-10-CM | POA: Diagnosis present

## 2023-12-02 DIAGNOSIS — F32A Depression, unspecified: Secondary | ICD-10-CM | POA: Diagnosis present

## 2023-12-02 LAB — ECHOCARDIOGRAM COMPLETE
AR max vel: 1.81 cm2
AV Area VTI: 2.29 cm2
AV Area mean vel: 1.66 cm2
AV Mean grad: 2 mm[Hg]
AV Peak grad: 3 mm[Hg]
Ao pk vel: 0.87 m/s
Area-P 1/2: 3.99 cm2
Height: 65 in
S' Lateral: 4.3 cm
Weight: 2716.07 [oz_av]

## 2023-12-02 LAB — CBC WITH DIFFERENTIAL/PLATELET
Abs Immature Granulocytes: 0.01 10*3/uL (ref 0.00–0.07)
Basophils Absolute: 0 10*3/uL (ref 0.0–0.1)
Basophils Relative: 1 %
Eosinophils Absolute: 0.2 10*3/uL (ref 0.0–0.5)
Eosinophils Relative: 3 %
HCT: 32.9 % — ABNORMAL LOW (ref 36.0–46.0)
Hemoglobin: 9.7 g/dL — ABNORMAL LOW (ref 12.0–15.0)
Immature Granulocytes: 0 %
Lymphocytes Relative: 34 %
Lymphs Abs: 1.7 10*3/uL (ref 0.7–4.0)
MCH: 27.9 pg (ref 26.0–34.0)
MCHC: 29.5 g/dL — ABNORMAL LOW (ref 30.0–36.0)
MCV: 94.5 fL (ref 80.0–100.0)
Monocytes Absolute: 0.6 10*3/uL (ref 0.1–1.0)
Monocytes Relative: 11 %
Neutro Abs: 2.5 10*3/uL (ref 1.7–7.7)
Neutrophils Relative %: 51 %
Platelets: 234 10*3/uL (ref 150–400)
RBC: 3.48 MIL/uL — ABNORMAL LOW (ref 3.87–5.11)
RDW: 15.9 % — ABNORMAL HIGH (ref 11.5–15.5)
WBC: 4.9 10*3/uL (ref 4.0–10.5)
nRBC: 0 % (ref 0.0–0.2)

## 2023-12-02 LAB — BASIC METABOLIC PANEL
Anion gap: 13 (ref 5–15)
Anion gap: 10 (ref 5–15)
BUN: 14 mg/dL (ref 8–23)
BUN: 13 mg/dL (ref 8–23)
CO2: 18 mmol/L — ABNORMAL LOW (ref 22–32)
CO2: 25 mmol/L (ref 22–32)
Calcium: 7.9 mg/dL — ABNORMAL LOW (ref 8.9–10.3)
Calcium: 9.5 mg/dL (ref 8.9–10.3)
Chloride: 107 mmol/L (ref 98–111)
Chloride: 100 mmol/L (ref 98–111)
Creatinine, Ser: 0.78 mg/dL (ref 0.44–1.00)
Creatinine, Ser: 0.86 mg/dL (ref 0.44–1.00)
GFR, Estimated: >60 mL/min (ref >60)
GFR, Estimated: >60 mL/min (ref >60)
Glucose, Bld: 104 mg/dL — ABNORMAL HIGH (ref 70–99)
Glucose, Bld: 120 mg/dL — ABNORMAL HIGH (ref 70–99)
Potassium: 3.3 mmol/L — ABNORMAL LOW (ref 3.5–5.1)
Potassium: 4.8 mmol/L (ref 3.5–5.1)
Sodium: 138 mmol/L (ref 135–145)
Sodium: 135 mmol/L (ref 135–145)

## 2023-12-02 LAB — HEPARIN LEVEL (UNFRACTIONATED): Heparin Unfractionated: 0.71 [IU]/mL — ABNORMAL HIGH (ref 0.30–0.70)

## 2023-12-02 LAB — TSH: TSH: 1.395 u[IU]/mL (ref 0.350–4.500)

## 2023-12-02 LAB — MAGNESIUM: Magnesium: 1.8 mg/dL (ref 1.7–2.4)

## 2023-12-02 MED ORDER — CARVEDILOL 3.125 MG PO TABS
3.1250 mg | ORAL_TABLET | Freq: Two times a day (BID) | ORAL | Status: DC
  Administered 2023-12-02: 3.125 mg via ORAL
  Filled 2023-12-02: qty 1

## 2023-12-02 MED ORDER — VITAMIN D 25 MCG (1000 UNIT) PO TABS
5000.0000 [IU] | ORAL_TABLET | Freq: Every day | ORAL | Status: DC
  Administered 2023-12-02 – 2023-12-08 (×7): 5000 [IU] via ORAL
  Filled 2023-12-02 (×7): qty 5

## 2023-12-02 MED ORDER — APIXABAN 5 MG PO TABS
5.0000 mg | ORAL_TABLET | Freq: Two times a day (BID) | ORAL | Status: DC
  Administered 2023-12-02 – 2023-12-08 (×13): 5 mg via ORAL
  Filled 2023-12-02 (×13): qty 1

## 2023-12-02 MED ORDER — GABAPENTIN 300 MG PO CAPS
300.0000 mg | ORAL_CAPSULE | Freq: Every day | ORAL | Status: DC
  Administered 2023-12-02 – 2023-12-07 (×6): 300 mg via ORAL
  Filled 2023-12-02 (×6): qty 1

## 2023-12-02 MED ORDER — DULOXETINE HCL 30 MG PO CPEP
30.0000 mg | ORAL_CAPSULE | Freq: Every day | ORAL | Status: DC
  Administered 2023-12-02 – 2023-12-08 (×7): 30 mg via ORAL
  Filled 2023-12-02 (×7): qty 1

## 2023-12-02 MED ORDER — HEPARIN (PORCINE) 25000 UT/250ML-% IV SOLN
1000.0000 [IU]/h | INTRAVENOUS | Status: DC
  Administered 2023-12-02: 1100 [IU]/h via INTRAVENOUS
  Filled 2023-12-02: qty 250

## 2023-12-02 MED ORDER — MAGNESIUM SULFATE 2 GM/50ML IV SOLN
2.0000 g | Freq: Once | INTRAVENOUS | Status: AC
  Administered 2023-12-02: 2 g via INTRAVENOUS
  Filled 2023-12-02: qty 50

## 2023-12-02 MED ORDER — METOPROLOL TARTRATE 5 MG/5ML IV SOLN
5.0000 mg | Freq: Once | INTRAVENOUS | Status: AC
  Administered 2023-12-02: 5 mg via INTRAVENOUS
  Filled 2023-12-02: qty 5

## 2023-12-02 MED ORDER — PERFLUTREN LIPID MICROSPHERE
1.0000 mL | INTRAVENOUS | Status: AC | PRN
  Administered 2023-12-02: 2 mL via INTRAVENOUS

## 2023-12-02 MED ORDER — ACETAMINOPHEN 325 MG PO TABS
650.0000 mg | ORAL_TABLET | ORAL | Status: DC | PRN
Start: 2023-12-02 — End: 2023-12-08

## 2023-12-02 MED ORDER — HEPARIN BOLUS VIA INFUSION
4000.0000 [IU] | Freq: Once | INTRAVENOUS | Status: AC
  Administered 2023-12-02: 4000 [IU] via INTRAVENOUS
  Filled 2023-12-02: qty 4000

## 2023-12-02 MED ORDER — SPIRONOLACTONE 12.5 MG HALF TABLET
12.5000 mg | ORAL_TABLET | Freq: Every day | ORAL | Status: DC
  Administered 2023-12-02 – 2023-12-04 (×3): 12.5 mg via ORAL
  Filled 2023-12-02 (×4): qty 1

## 2023-12-02 MED ORDER — FAMOTIDINE 20 MG PO TABS
20.0000 mg | ORAL_TABLET | Freq: Every day | ORAL | Status: DC | PRN
  Administered 2023-12-08: 20 mg via ORAL
  Filled 2023-12-02: qty 1

## 2023-12-02 MED ORDER — CARVEDILOL 6.25 MG PO TABS
6.2500 mg | ORAL_TABLET | Freq: Two times a day (BID) | ORAL | Status: DC
  Administered 2023-12-02 – 2023-12-04 (×4): 6.25 mg via ORAL
  Filled 2023-12-02 (×5): qty 1

## 2023-12-02 MED ORDER — FUROSEMIDE 10 MG/ML IJ SOLN
40.0000 mg | Freq: Once | INTRAMUSCULAR | Status: AC
  Administered 2023-12-02: 40 mg via INTRAVENOUS
  Filled 2023-12-02: qty 4

## 2023-12-02 MED ORDER — ALLOPURINOL 100 MG PO TABS
100.0000 mg | ORAL_TABLET | Freq: Every day | ORAL | Status: DC
  Administered 2023-12-02 – 2023-12-08 (×7): 100 mg via ORAL
  Filled 2023-12-02 (×7): qty 1

## 2023-12-02 MED ORDER — EZETIMIBE 10 MG PO TABS
10.0000 mg | ORAL_TABLET | Freq: Every day | ORAL | Status: DC
  Administered 2023-12-02 – 2023-12-08 (×7): 10 mg via ORAL
  Filled 2023-12-02 (×7): qty 1

## 2023-12-02 MED ORDER — HYDRALAZINE HCL 50 MG PO TABS
50.0000 mg | ORAL_TABLET | Freq: Three times a day (TID) | ORAL | Status: DC
  Administered 2023-12-02 – 2023-12-03 (×6): 50 mg via ORAL
  Filled 2023-12-02 (×4): qty 1
  Filled 2023-12-02: qty 2
  Filled 2023-12-02: qty 1

## 2023-12-02 MED ORDER — SODIUM CHLORIDE 3 % IN NEBU: 4.0000 mL | INHALATION_SOLUTION | Freq: Two times a day (BID) | RESPIRATORY_TRACT | Status: DC | PRN

## 2023-12-02 MED ORDER — OXYCODONE HCL 5 MG PO TABS
2.5000 mg | ORAL_TABLET | Freq: Four times a day (QID) | ORAL | Status: DC | PRN
  Administered 2023-12-02 – 2023-12-08 (×13): 5 mg via ORAL
  Filled 2023-12-02 (×14): qty 1

## 2023-12-02 MED ORDER — POTASSIUM CHLORIDE 20 MEQ PO PACK
60.0000 meq | PACK | Freq: Once | ORAL | Status: AC
  Administered 2023-12-02: 60 meq via ORAL
  Filled 2023-12-02: qty 3

## 2023-12-02 MED ORDER — IOHEXOL 350 MG/ML SOLN
75.0000 mL | Freq: Once | INTRAVENOUS | Status: AC | PRN
  Administered 2023-12-02: 75 mL via INTRAVENOUS

## 2023-12-02 MED ORDER — FUROSEMIDE 20 MG PO TABS: 20.0000 mg | ORAL_TABLET | ORAL | Status: DC

## 2023-12-02 MED ORDER — ALBUTEROL SULFATE (2.5 MG/3ML) 0.083% IN NEBU: 3.0000 mL | INHALATION_SOLUTION | Freq: Four times a day (QID) | RESPIRATORY_TRACT | Status: DC | PRN

## 2023-12-02 MED ORDER — LIDOCAINE 5 % EX PTCH
1.0000 | MEDICATED_PATCH | CUTANEOUS | Status: DC
  Filled 2023-12-02: qty 1

## 2023-12-02 NOTE — Consult Note (Addendum)
Cardiology Consultation   Patient ID: Donna Herrera MRN: [id number]; DOB: 1946-04-26  Admit date: 12/01/2023 Date of Consult: 12/02/2023  PCP:  Donna Lash, MD   [facility] Providers Cardiologist:  Dietrich Pates, MD        Patient Profile:   Donna Herrera is a 78 y.o. female with a hx of hypertension, pulmonary fibrosis who is being seen 12/02/2023 for the evaluation of atrial fibrillation at the request of Donna Herrera.  History of Present Illness:   Donna Herrera presented to the hospital with 2 weeks of shortness of breath and orthopnea with worsening leg swelling.  She has dyspnea mostly with exertion, but at times she is dyspneic at rest.  She has no associated chest pain.  She was initially seen by her PCP and was given albuterol.  She was again seen by her PCP and was sent to the emergency room.  In the emergency room, she was found to have rapid heart rate.  EKG showed atrial fibrillation.  Heart rates in the emergency room ranged from the 80s to the 120s.  She has been started on IV heparin.  She currently feels well at rest.  She has not been up and exerting herself since she has been in the emergency room.   Past Medical History:  Diagnosis Date   Adenomatous polyp 03/23/2020   Colonoscopy 2021 notable for adenomatous polyp. Per GI, no further follow up colonoscopies recommended given patient's age.   Allergy    Arthritis    Atherosclerosis of aorta (HCC) 07/15/2021   Candidiasis, mouth 10/19/2017   Chronic combined systolic and diastolic CHF (congestive heart failure) (HCC) 10/21/2015   Echo 3/16: EF 40%, diffuse HK, mild MR, moderate LAE, mild RAE, PASP 42 mmHg  //  Echo 12/16: Mild LVH, EF 35-40%, diffuse HK, Gr 2 DD, MAC, mild MR, mod LAE, normal RVSF, PASP 49 mmHg // Echo 5/19: EF 15-20, diff HK, Gr 2 DD, trivial AI, MAC, trivial MR, mod LAE, normal RVSF, mild TR, PASP 62 // Limited Echo 8/19: mod LVH, EF 40-45, diff HK, Gr 1 DD, MAC,  mild LAE    Chronic left shoulder pain 04/10/2013   Chronic lung disease    Chronic pain    Chronic pain syndrome 07/20/2017   Confusion 06/08/2021   Depression 08/25/2016   DJD (degenerative joint disease) 10/12/2012   Multiple joint replacements by Dr. Leslee Home.    Dyshidrotic eczema 12/31/2014   Edentulous 07/19/2021   Emphysema lung (HCC) 03/02/2021   Long smoking history.  Noted on CT Low Dose Chest: 01/2021: IMPRESSION: Chronic interstitial changes raise the question of pulmonary fibrosis. High-resolution chest CT may prove helpful to further evaluate as clinically warranted.Emphysema (ICD10-J43.9) and Aortic Atherosclerosis (ICD10-170.0)  Recommended pulmonology referral but patient declined. Recommend continued discussion and symptom monitor   Essential hypertension 07/20/2017   GERD (gastroesophageal reflux disease)    Gout    H/O slipped capital femoral epiphysis (SCFE)    Heart failure, left, with LVEF 41-49% (HCC) 10/21/2015   transthoracic echocardiogram 06/06/18: EF appears improved to 40-45% with diffuse hypokinesis  A. Echo 3/16: EF 40%, diffuse HK, mild MR, moderate LAE, mild RAE, PASP 42 mmHg  //  B. Echo 12/16: Mild LVH, EF 35-40%, diffuse HK, grade 2 diastolic dysfunction, MAC, mild MR, moderate LAE, normal RVSF, PASP 49 mmHg   History of total right hip replacement 04/02/2019   Followed closely with orthopedist Dr. Charlann Boxer. Patient is s/p total right hip replacement in 1980's with  evidence of bone loss and osteopenia making reconstruction unlikely. Plan for conservative treatment at this time with crutches for ambulation and pain control.   Hyperlipidemia    Hypertension    Hypertensive heart disease with CHF (congestive heart failure) (HCC) 10/12/2012   Loss of taste 02/06/2018   Mass of breast, left 07/21/2014   NICM (nonischemic cardiomyopathy) (HCC) 06/09/2016   A. LHC 7/17: Normal coronary arteries, EF 30-35%, LVEDP 14 mmHg   Pain in gums 04/18/2018   Pre-diabetes    Pulmonary  fibrosis (HCC) 03/02/2021   CT Low Dose Chest: 01/2021: IMPRESSION: Chronic interstitial changes raise the question of pulmonary fibrosis. High-resolution chest CT may prove helpful to further evaluate as clinically warranted.Emphysema (ICD10-J43.9) and Aortic Atherosclerosis (ICD10-170.0)  Recommended pulmonology referral but patient declined. Recommend continued discussion and symptom monitoring.   Pulmonary nodule 1 cm or greater in diameter 03/02/2021   CT low dose Chest: 01/2021: 1. Lung-RADS 4A, suspicious. 10 mm posterior left lower lobe pulmonary nodule, possibly scarring. Follow up low-dose chest CT without contrast in 3 months (please use the following order, "CT CHEST LCS NODULE FOLLOW-UP W/O CM") is recommended. Alternatively, PET may be considered when there is a solid component 8mm or larger.  Plan: Follow up low dose CT ordered in 3 mon   Rib pain on left side 09/17/2020   Right hip pain 11/02/2012   Tendonitis, Achilles, left 05/24/2017   Tobacco abuse 09/29/2014   Urge incontinence 07/19/2021   Vitamin D deficiency 12/31/2014    Past Surgical History:  Procedure Laterality Date   CARDIAC CATHETERIZATION  2005   normal-Hochrein   CARDIAC CATHETERIZATION N/A 05/11/2016   Procedure: Right/Left Heart Cath and Coronary Angiography;  Surgeon: Peter M Swaziland, MD;  Location: [facility] INVASIVE CV LAB;  Service: Cardiovascular;  Laterality: N/A;   CESAREAN SECTION     COLONOSCOPY  1990's ??   HIP FUSION Left    left    JOINT REPLACEMENT Bilateral 1991, 1997   [place]   ROTATOR CUFF REPAIR     bilateral   TOOTH EXTRACTION N/A 04/03/2020   Procedure: DENTAL RESTORATION/EXTRACTIONS;  Surgeon: Ocie Doyne, DDS;  Location: [facility] OR;  Service: Oral Surgery;  Laterality: N/A;   TOTAL HIP ARTHROPLASTY  1987   right. Dr. Leslee Home   TUBAL LIGATION       Home Medications:  Prior to Admission medications   Medication Sig Start Date End Date Taking? Authorizing Provider  acetaminophen (TYLENOL) 500 MG  tablet Take 1,000 mg by mouth daily as needed for mild pain or moderate pain.   Yes [provider]  albuterol (VENTOLIN HFA) 108 (90 Base) MCG/ACT inhaler Inhale 1-2 puffs into the lungs every 6 (six) hours as needed for wheezing or shortness of breath. 05/16/23  Yes Baloch, Mahnoor, MD  allopurinol (ZYLOPRIM) 100 MG tablet Take 1 tablet (100 mg total) by mouth daily. 08/01/23  Yes Baloch, Mahnoor, MD  aspirin 81 MG tablet Take 1 tablet (81 mg total) by mouth daily. 04/16/15  Yes Latrelle Dodrill, MD  baclofen (LIORESAL) 10 MG tablet TAKE 1 TABLET 3 TIMES DAILY. Kenza Munar REFILL FOR 2 MONTH SUPPLY, PLEASE SCHEDULE APPOINTMENT Patient taking differently: Take 10 mg by mouth 3 (three) times daily as needed for muscle spasms. 12/07/22  Yes Paige, Turkey J, DO  carvedilol (COREG) 3.125 MG tablet TAKE 1 TABLET BY MOUTH 2 TIMES DAILY. 09/15/23  Yes Baloch, Mahnoor, MD  DULoxetine (CYMBALTA) 30 MG capsule Take 1 capsule (30 mg total) by mouth daily.  Patient taking differently: Take 30 mg by mouth at bedtime. 08/01/23  Yes Baloch, Mahnoor, MD  ezetimibe (ZETIA) 10 MG tablet TAKE 1 TABLET BY MOUTH EVERY DAY Patient taking differently: Take 10 mg by mouth 3 (three) times a week. 09/25/23  Yes Pricilla Riffle, MD  famotidine (PEPCID) 20 MG tablet TAKE 1 TABLET (20 MG TOTAL) BY MOUTH DAILY AS NEEDED FOR HEARTBURN OR INDIGESTION. 03/07/22  Yes Paige, Turkey J, DO  furosemide (LASIX) 20 MG tablet TAKE 20 MG TABLET BY MOUTH ON Monday, WED, FRI 01/13/22  Yes Pricilla Riffle, MD  gabapentin (NEURONTIN) 300 MG capsule Take 1 capsule (300 mg total) by mouth at bedtime as needed. TAKE 1 CAP BY MOUTH EVERYDAY AT BEDTIME. 08/01/23  Yes Baloch, Mahnoor, MD  hydrALAZINE (APRESOLINE) 50 MG tablet TAKE 1 TABLET BY MOUTH THREE TIMES A DAY 06/27/23  Yes Pricilla Riffle, MD  Menthol, Topical Analgesic, (ICY HOT BACK EX) Apply 1 Application topically 2 (two) times daily as needed (lower leg pain, knee pain).   Yes [provider]  Multiple Vitamin (MULTIVITAMIN WITH MINERALS) TABS tablet Take by mouth 2 (two) times a week.   Yes [provider]  naloxone [facility]) nasal spray 4 mg/0.1 mL SMARTSIG:Both Nares 04/03/23  Yes [provider]  oxyCODONE (OXY IR/ROXICODONE) 5 MG immediate release tablet Take 5 mg by mouth every 6 (six) hours as needed for moderate pain or severe pain. 04/03/23  Yes [provider]  spironolactone (ALDACTONE) 25 MG tablet TAKE 1/2 TABLET BY MOUTH EVERY DAY 09/15/23  Yes Baloch, Mahnoor, MD  Vitamin D, Ergocalciferol, (DRISDOL) 1.25 MG (50000 UNIT) CAPS capsule Take 50,000 Units by mouth once a week. Take on Wednesday 08/01/23  Yes [provider]  colchicine 0.6 MG tablet TAKE 1 TABLET BY MOUTH EVERY DAY Patient taking differently: Take 0.6 mg by mouth daily as needed (gout flare up). 09/21/23   Georg Ruddle Mahnoor, MD    Inpatient Medications: Scheduled Meds:  allopurinol  100 mg Oral Daily   carvedilol  3.125 mg Oral BID   cholecalciferol  5,000 Units Oral Daily   DULoxetine  30 mg Oral Daily   ezetimibe  10 mg Oral Daily   [START ON 12/04/2023] furosemide  20 mg Oral Q M,W,F   gabapentin  300 mg Oral QHS   hydrALAZINE  50 mg Oral TID   lidocaine  1 patch Transdermal Q24H   spironolactone  12.5 mg Oral Daily   Continuous Infusions:  heparin 1,000 Units/hr (12/02/23 1338)   PRN Meds: acetaminophen, albuterol, famotidine, oxyCODONE  Allergies:    Allergies  Allergen Reactions   Crab [Shellfish Allergy]     Itching to lips   Morphine And Codeine Itching and Swelling   Atorvastatin Other (See Comments)    achiness   Rosuvastatin Other (See Comments)    Muscle pain and achiness    Social History:   Social History   Socioeconomic History   Marital status: Married    Spouse name: Not on file   Number of children: Not on file   Years of education: Not on file   Highest education level: Not on file  Occupational History   Not on file   Tobacco Use   Smoking status: Former    Current packs/day: 0.00    Average packs/day: 0.5 packs/day for 55.0 years (27.5 ttl pk-yrs)    Types: Cigarettes    Start date: 86    Quit date: 2017    Years  since quitting: 8.0    Passive exposure: Past   Smokeless tobacco: Never  Vaping Use   Vaping status: Never Used  Substance and Sexual Activity   Alcohol use: Not Currently    Comment: on holidays   Drug use: No   Sexual activity: Not on file  Other Topics Concern   Not on file  Social History Narrative   Formerly daycare provider. Lives with husband, daughter Lajoyce Corners and grandkids.   Social Drivers of Corporate investment banker Strain: Low Risk  (04/28/2023)   Overall Financial Resource Strain (CARDIA)    Difficulty of Paying Living Expenses: Not hard at all  Food Insecurity: No Food Insecurity (04/28/2023)   Hunger Vital Sign    Worried About Running Out of Food in the Last Year: Never true    Ran Out of Food in the Last Year: Never true  Transportation Needs: No Transportation Needs (04/28/2023)   PRAPARE - Administrator, Civil Service (Medical): No    Lack of Transportation (Non-Medical): No  Physical Activity: Insufficiently Active (04/28/2023)   Exercise Vital Sign    Days of Exercise per Week: 3 days    Minutes of Exercise per Session: 30 min  Stress: No Stress Concern Present (04/28/2023)   [employer] of Occupational Health - Occupational Stress Questionnaire    Feeling of Stress : Only a little  Social Connections: Moderately Isolated (04/28/2023)   Social Connection and Isolation Panel [NHANES]    Frequency of Communication with Friends and Family: More than three times a week    Frequency of Social Gatherings with Friends and Family: Three times a week    Attends Religious Services: More than 4 times per year    Active Member of Clubs or Organizations: No    Attends Banker Meetings: Never    Marital Status: Widowed  Intimate Partner  Violence: Not At Risk (04/28/2023)   Humiliation, Afraid, Rape, and Kick questionnaire    Fear of Current or Ex-Partner: No    Emotionally Abused: No    Physically Abused: No    Sexually Abused: No    Family History:    Family History  Problem Relation Age of Onset   Depression Mother    Diabetes Mother    Hypertension Mother    Stroke Mother    Heart disease Mother    Cancer Father        unknown primary   Diabetes Sister    Stomach cancer Brother    Throat cancer Son    Breast cancer Neg Hx    Esophageal cancer Neg Hx    Colon cancer Neg Hx    Colon polyps Neg Hx      ROS:  Please see the history of present illness.   All other ROS reviewed and negative.     Physical Exam/Data:   Vitals:   12/02/23 1038 12/02/23 1045 12/02/23 1058 12/02/23 1415  BP: (!) 126/106 (!) 136/90  (!) 138/107  Pulse: 92 79  86  Resp:  (!) 22  19  Temp:   98 F (36.7 C)   TempSrc:   Oral   SpO2:  97%  100%  Weight:      Height:        Intake/Output Summary (Last 24 hours) at 12/02/2023 1432 Last data filed at 12/02/2023 1338 Gross per 24 hour  Intake 160.04 ml  Output --  Net 160.04 ml      12/01/2023  3:12 PM 12/01/2023    1:30 PM 11/03/2023   10:54 AM  Last 3 Weights  Weight (lbs) 169 lb 12.1 oz 171 lb 12.8 oz 173 lb  Weight (kg) 77 kg 77.928 kg 78.472 kg     Body mass index is 28.25 kg/m.  General:  Well nourished, well developed, in no acute distress HEENT: normal Neck: no JVD Vascular: No carotid bruits; Distal pulses 2+ bilaterally Cardiac: Irregular tachycardic, no murmur  Lungs:  clear to auscultation bilaterally, no wheezing, rhonchi or rales  Abd: soft, nontender, no hepatomegaly  Ext: no edema Musculoskeletal:  No deformities, BUE and BLE strength normal and equal Skin: warm and dry  Neuro:  CNs 2-12 intact, no focal abnormalities noted Psych:  Normal affect   EKG:  The EKG was personally reviewed and demonstrates: Atrial fibrillation Telemetry:  Telemetry  was personally reviewed and demonstrates: Atrial fibrillation  Relevant CV Studies: TTE   1. No evidence of LV thrombus. Left ventricular ejection fraction, by  estimation, is 20 to 25%. The left ventricle has severely decreased  function. The left ventricle demonstrates global hypokinesis. The left  ventricular internal cavity size was  moderately dilated. Left ventricular diastolic parameters are  indeterminate. Elevated left ventricular end-diastolic pressure.   2. Right ventricular systolic function is severely reduced. The right  ventricular size is normal. There is mildly elevated pulmonary artery  systolic pressure. The estimated right ventricular systolic pressure is  40.0 mmHg.   3. Left atrial size was moderately dilated.   4. Right atrial size was moderately dilated.   5. The mitral valve is normal in structure. Trivial mitral valve  regurgitation. No evidence of mitral stenosis.   6. The tricuspid valve is abnormal. Tricuspid valve regurgitation is  moderate.   7. The aortic valve was not well visualized. Aortic valve regurgitation  is not visualized. No aortic stenosis is present.   8. Pulmonic valve regurgitation not assessed.   9. The inferior vena cava is normal in size with greater than 50%  respiratory variability, suggesting right atrial pressure of 3 mmHg. .   Laboratory Data:  High Sensitivity Troponin:   Recent Labs  Lab 12/01/23 1529 12/01/23 1729  TROPONINIHS 16 17     Chemistry Recent Labs  Lab 12/01/23 1514 12/01/23 1529 12/02/23 0446  NA 137  --  138  K 4.0  --  3.3*  CL 103  --  107  CO2 22  --  18*  GLUCOSE 87  --  104*  BUN 13  --  14  CREATININE 0.78  --  0.78  CALCIUM 9.1  --  7.9*  MG  --  2.0 1.8  GFRNONAA >60  --  >60  ANIONGAP 12  --  13    No results for input(s): "PROT", "ALBUMIN", "AST", "ALT", "ALKPHOS", "BILITOT" in the last 168 hours. Lipids No results for input(s): "CHOL", "TRIG", "HDL", "LABVLDL", "LDLCALC", "CHOLHDL"  in the last 168 hours.  Hematology Recent Labs  Lab 12/01/23 1514 12/02/23 0446  WBC 4.4 4.9  RBC 3.79* 3.48*  HGB 10.5* 9.7*  HCT 34.4* 32.9*  MCV 90.8 94.5  MCH 27.7 27.9  MCHC 30.5 29.5*  RDW 15.7* 15.9*  PLT 265 234   Thyroid  Recent Labs  Lab 12/02/23 0446  TSH 1.395    BNP Recent Labs  Lab 12/01/23 1515  BNP 495.9*    DDimer No results for input(s): "DDIMER" in the last 168 hours.   Radiology/Studies:  ECHOCARDIOGRAM COMPLETE Result  Date: 12/02/2023    ECHOCARDIOGRAM REPORT   Patient Name:   Donna Herrera Date of Exam: 12/02/2023 Medical Rec #:  [id number]               Height:       65.0 in Accession #:    [id number]              Weight:       169.8 lb Date of Birth:  23-Mar-1946              BSA:          1.845 m Patient Age:    53 years                BP:           114/68 mmHg Patient Gender: F                       HR:           61 bpm. Exam Location:  Inpatient Procedure: 2D Echo, Cardiac Doppler, Color Doppler and Intracardiac            Opacification Agent Indications:    Dyspnea R06.00  History:        Patient has prior history of Echocardiogram examinations, most                 recent 02/14/2023. CHF and Cardiomyopathy; Risk                 Factors:Hypertension.  Sonographer:    Darlys Gales Referring Phys: [phone] NATHAN PICKERING IMPRESSIONS  1. No evidence of LV thrombus. Left ventricular ejection fraction, by estimation, is 20 to 25%. The left ventricle has severely decreased function. The left ventricle demonstrates global hypokinesis. The left ventricular internal cavity size was moderately dilated. Left ventricular diastolic parameters are indeterminate. Elevated left ventricular end-diastolic pressure.  2. Right ventricular systolic function is severely reduced. The right ventricular size is normal. There is mildly elevated pulmonary artery systolic pressure. The estimated right ventricular systolic pressure is 40.0 mmHg.  3. Left atrial size was moderately  dilated.  4. Right atrial size was moderately dilated.  5. The mitral valve is normal in structure. Trivial mitral valve regurgitation. No evidence of mitral stenosis.  6. The tricuspid valve is abnormal. Tricuspid valve regurgitation is moderate.  7. The aortic valve was not well visualized. Aortic valve regurgitation is not visualized. No aortic stenosis is present.  8. Pulmonic valve regurgitation not assessed.  9. The inferior vena cava is normal in size with greater than 50% respiratory variability, suggesting right atrial pressure of 3 mmHg. Comparison(s): Changes from prior study are noted. LVEF worsened from 50-55% to 20-25% now with severe RV dysfunction. FINDINGS  Left Ventricle: No evidence of LV thrombus. Left ventricular ejection fraction, by estimation, is 20 to 25%. The left ventricle has severely decreased function. The left ventricle demonstrates global hypokinesis. The left ventricular internal cavity size was moderately dilated. There is no left ventricular hypertrophy. Left ventricular diastolic parameters are indeterminate. Elevated left ventricular end-diastolic pressure. Right Ventricle: The right ventricular size is normal. No increase in right ventricular wall thickness. Right ventricular systolic function is severely reduced. There is mildly elevated pulmonary artery systolic pressure. The tricuspid regurgitant velocity is 3.04 m/s, and with an assumed right atrial pressure of 3 mmHg, the estimated right ventricular systolic pressure is 40.0 mmHg. Left Atrium: Left atrial size was moderately dilated. Right Atrium:  Right atrial size was moderately dilated. Pericardium: There is no evidence of pericardial effusion. Mitral Valve: The mitral valve is normal in structure. Trivial mitral valve regurgitation. No evidence of mitral valve stenosis. Tricuspid Valve: The tricuspid valve is abnormal. Tricuspid valve regurgitation is moderate . No evidence of tricuspid stenosis. Aortic Valve: The aortic  valve was not well visualized. Aortic valve regurgitation is not visualized. No aortic stenosis is present. Aortic valve mean gradient measures 2.0 mmHg. Aortic valve peak gradient measures 3.0 mmHg. Aortic valve area, by VTI measures 2.29 cm. Pulmonic Valve: The pulmonic valve was not assessed. Pulmonic valve regurgitation not assessed. Aorta: The aortic root is normal in size and structure. Venous: The inferior vena cava is normal in size with greater than 50% respiratory variability, suggesting right atrial pressure of 3 mmHg. IAS/Shunts: The interatrial septum was not well visualized.  LEFT VENTRICLE PLAX 2D LVIDd:         4.60 cm   Diastology LVIDs:         4.30 cm   LV e' medial:    5.87 cm/s LV PW:         0.90 cm   LV E/e' medial:  16.8 LV IVS:        1.00 cm   LV e' lateral:   7.40 cm/s LVOT diam:     1.80 cm   LV E/e' lateral: 13.3 LV SV:         34 LV SV Index:   19 LVOT Area:     2.54 cm  RIGHT VENTRICLE RV S prime:     9.46 cm/s LEFT ATRIUM             Index        RIGHT ATRIUM           Index LA Vol (A2C):   42.3 ml 22.93 ml/m  RA Area:     21.20 cm LA Vol (A4C):   30.5 ml 16.53 ml/m  RA Volume:   60.80 ml  32.95 ml/m LA Biplane Vol: 38.7 ml 20.98 ml/m  AORTIC VALVE AV Area (Vmax):    1.81 cm AV Area (Vmean):   1.66 cm AV Area (VTI):     2.29 cm AV Vmax:           86.50 cm/s AV Vmean:          67.000 cm/s AV VTI:            0.150 m AV Peak Grad:      3.0 mmHg AV Mean Grad:      2.0 mmHg LVOT Vmax:         61.50 cm/s LVOT Vmean:        43.700 cm/s LVOT VTI:          0.135 m LVOT/AV VTI ratio: 0.90  AORTA Ao Root diam: 2.90 cm MITRAL VALVE               TRICUSPID VALVE MV Area (PHT): 3.99 cm    TR Peak grad:   37.0 mmHg MV Decel Time: 190 msec    TR Vmax:        304.00 cm/s MV E velocity: 98.50 cm/s MV A velocity: 25.20 cm/s  SHUNTS MV E/A ratio:  3.91        Systemic VTI:  0.14 m  Systemic Diam: 1.80 cm Vishnu Priya Mallipeddi Electronically signed by Winfield Rast  Mallipeddi Signature Date/Time: 12/02/2023/10:49:36 AM    Final    VAS [name] LOWER EXTREMITY VENOUS (DVT) Result Date: 12/02/2023  Lower Venous DVT Study Patient Name:  Donna Herrera  Date of Exam:   12/02/2023 Medical Rec #: [id number]                Accession #:    [id number] Date of Birth: [birth year]               Patient Gender: F Patient Age:   56 years Exam Location:  [facility] Procedure:      VAS [name] LOWER EXTREMITY VENOUS (DVT) Referring Phys: CARINA BROWN --------------------------------------------------------------------------------  Indications: Swelling.  Risk Factors: Atrial fibrillation, CHF. Limitations: Body habitus, poor ultrasound/tissue interface, patient unable to position legs, edema and inability to tolerate compression maneuvers. Comparison Study: No prior study Performing Technologist: Sherren Kerns RVS  Examination Guidelines: A complete evaluation includes B-mode imaging, spectral Doppler, color Doppler, and power Doppler as needed of all accessible portions of each vessel. Bilateral testing is considered an integral part of a complete examination. Limited examinations for reoccurring indications may be performed as noted. The reflux portion of the exam is performed with the patient in reverse Trendelenburg.  +-------+---------------+---------+-----------+----------------+---------------+ RIGHT  CompressibilityPhasicitySpontaneityProperties      Thrombus Aging  +-------+---------------+---------+-----------+----------------+---------------+ CFV    Full           Yes      No         pulsatile                                                                 waveforms                       +-------+---------------+---------+-----------+----------------+---------------+ SFJ    Full                                                               +-------+---------------+---------+-----------+----------------+---------------+ FV Prox               Yes       No         pulsatile                                                                 waveforms                       +-------+---------------+---------+-----------+----------------+---------------+ FV Mid                Yes      No         pulsatile  waveforms                       +-------+---------------+---------+-----------+----------------+---------------+ POP                   Yes      No         pulsatile                                                                 waveforms                       +-------+---------------+---------+-----------+----------------+---------------+ PTV                                                       Not well                                                                  visualized      +-------+---------------+---------+-----------+----------------+---------------+ PERO                                                      Not well                                                                  visualized      +-------+---------------+---------+-----------+----------------+---------------+   +---------+---------------+---------+-----------+---------------+--------------+ LEFT     CompressibilityPhasicitySpontaneityProperties     Thrombus Aging +---------+---------------+---------+-----------+---------------+--------------+ CFV      Full           Yes                 pulsatile                                                                 waveforms                     +---------+---------------+---------+-----------+---------------+--------------+ SFJ      Full                                                             +---------+---------------+---------+-----------+---------------+--------------+  FV Prox                 Yes      No         pulsatile                                                                  waveforms                     +---------+---------------+---------+-----------+---------------+--------------+ FV Mid                  Yes      No         pulsatile                                                                 waveforms                     +---------+---------------+---------+-----------+---------------+--------------+ FV Distal                                                  Not well                                                                  visualized     +---------+---------------+---------+-----------+---------------+--------------+ PFV                                                        Not well                                                                  visualized     +---------+---------------+---------+-----------+---------------+--------------+ POP                                                        patent by  color          +---------+---------------+---------+-----------+---------------+--------------+ PTV                                                        Not well                                                                  visualized     +---------+---------------+---------+-----------+---------------+--------------+ PERO                                                       Not well                                                                  visualized     +---------+---------------+---------+-----------+---------------+--------------+    Summary: RIGHT: - There is no evidence of deep vein thrombosis in the lower extremity. However, portions of this examination were limited- see technologist comments above.  pulsatile waveforms  LEFT: - There is no evidence of deep vein thrombosis in the lower extremity. However, portions of this examination were limited- see technologist comments above.   Pulsatile waveforms.  *See table(s) above for measurements and observations.    Preliminary    CT Angio Chest Pulmonary Embolism (PE) W or WO Contrast Result Date: 12/02/2023 CLINICAL DATA:  Shortness of breath and dizziness.  PE suspected EXAM: CT ANGIOGRAPHY CHEST WITH CONTRAST TECHNIQUE: Multidetector CT imaging of the chest was performed using the standard protocol during bolus administration of intravenous contrast. Multiplanar CT image reconstructions and MIPs were obtained to evaluate the vascular anatomy. RADIATION DOSE REDUCTION: This exam was performed according to the departmental dose-optimization program which includes automated exposure control, adjustment of the mA and/or kV according to patient size and/or use of iterative reconstruction technique. CONTRAST:  75mL OMNIPAQUE IOHEXOL 350 MG/ML SOLN COMPARISON:  Same day chest radiograph and CT 05/08/2023 FINDINGS: Cardiovascular: Cardiomegaly. No pericardial effusion. Negative for acute pulmonary embolism. Normal caliber thoracic aorta. Aortic atherosclerotic calcification. Mediastinum/Nodes: Bowing of the posterior trachea compatible with expiratory phase imaging. Esophagus is unremarkable. No thoracic adenopathy. Lungs/Pleura: Patchy ground-glass opacities in both lungs. Scattered areas scarring and bronchiolectasis, similar to prior. No pleural effusion or pneumothorax. Upper Abdomen: No acute abnormality. 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 No acute fracture. Elevated right hemidiaphragm. Review of the MIP images confirms the above findings. IMPRESSION: 1. Negative for acute pulmonary embolism. 2. Patchy ground-glass opacities in both lungs. I favor this to represent air trapping on expiratory phase imaging. Atypical infection or edema could appear similarly. 3. Cardiomegaly. 4.  Aortic Atherosclerosis (ICD10-I70.0). 5. 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 Electronically Signed    By: Angelique Holm.D.  On: 12/02/2023 02:41   DG HIP UNILAT WITH PELVIS 2-3 VIEWS RIGHT Result Date: 12/02/2023 CLINICAL DATA:  Right hip pain EXAM: DG HIP (WITH OR WITHOUT PELVIS) 2-3V RIGHT COMPARISON:  11/02/2012 FINDINGS: Postoperative changes with right total hip arthroplasty. Superior subluxation of the femoral head component within the acetabular component, unchanged. Mild acetabula protrusio also unchanged. Bone resorption around the proximal femoral shaft and inter trochanteric region appears unchanged. Cerclage wires are present, some fractured. No change. No acute bony abnormalities are identified. Left hip demonstrates complete fusion. Increased trabecular appearance and mild expansile change in the left hip may indicate Paget's disease or postoperative change. 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 Appearances are unchanged since prior study. Old fusion of the left hip. No acute bony abnormalities. Electronically Signed   By: Burman Nieves M.D.   On: 12/02/2023 00:58   DG Chest 2 View Result Date: 12/01/2023 CLINICAL DATA:  Shortness of breath.  Dizziness. EXAM: CHEST - 2 VIEW COMPARISON:  11/24/2023 FINDINGS: Lateral view degraded by patient arm position. Moderate to marked right hemidiaphragm elevation again identified. Midline trachea. Moderate cardiomegaly. No pleural effusion or pneumothorax. No congestive failure. Left base scarring. IMPRESSION: Cardiomegaly without congestive failure. Electronically Signed   By: Jeronimo Greaves M.D.   On: 12/01/2023 17:27     Assessment and Plan:   Persistent atrial fibrillation: Patient has been short of breath for the last 2 weeks.  She is in atrial fibrillation, at times with rapid rates.  Additionally, her ejection fraction is now severely reduced at 20 to 25%.  This could be related to a tachycardia mediated cardiomyopathy.  Rhythm  control would be most beneficial.  She is on heparin.  Rubbie Goostree transition to Eliquis.  She Mike Hamre need TEE and cardioversion prior to discharge from the hospital.  Kynsli Haapala increase carvedilol to 6.25 mg twice daily. Acute systolic heart failure: Ejection fraction severely reduced.  Right atrial pressure not elevated, but elevated BNP.  She does have some lower extremity edema.  Ryle Buscemi give 1 dose of 40 mg Lasix. Tymar Polyak need Entresto prior to discharge. Secondary hypercoagulable state: Currently on heparin for atrial fibrillation.  Koni Kannan transition to Eliquis.   Risk Assessment/Risk Scores:        New York Heart Association (NYHA) Functional Class NYHA Class III  CHA2DS2-VASc Score = 5   This indicates a 7.2% annual risk of stroke. The patient's score is based upon: CHF History: 1 HTN History: 1 Diabetes History: 0 Stroke History: 0 Vascular Disease History: 0 Age Score: 2 Gender Score: 1         For questions or updates, please contact [facility] Please consult [email or web address] for contact info under    Signed, Nema Oatley Jorja Loa, MD  12/02/2023 2:32 PM

## 2023-12-02 NOTE — Assessment & Plan Note (Deleted)
Chronic, likely secondary to prior hip replacement. - Pain regimen: Lidocaine patch q24h. Tylenol for mild pain and Oxycodone 2.5-5 mg for moderate/severe pain, K pad as tolerated, consider Voltaren gel  - PT/OT - OOB as tolerated

## 2023-12-02 NOTE — Assessment & Plan Note (Signed)
New onset over the past 6 months, no sources of bleeding.  Last colonoscopy in 2021, no further recommended screening per GI. - Iron studies

## 2023-12-02 NOTE — ED Notes (Addendum)
CT made aware that pt now has IV access for CTA. Pt refusing respiratory panel swab at this time.

## 2023-12-02 NOTE — ED Notes (Signed)
ED TO INPATIENT HANDOFF REPORT  ED Nurse Name and Phone #: [phone]  S Name/Age/Gender Donna Herrera 78 y.o. female Room/Bed: 005C/005C  Code Status   Code Status: Full Code  Home/SNF/Other Home Patient oriented to: self, place, time, and situation Is this baseline? Yes   Triage Complete: Triage complete  Chief Complaint A-fib [facility]) [I48.91]  Triage Note Pt complains of SOB, dizziness and bilateral leg swelling x 2 weeks. Pt states PCP advised to come to ER today.    Allergies Allergies  Allergen Reactions   Crab [Shellfish Allergy]     Itching to lips   Morphine And Codeine Itching and Swelling   Atorvastatin Other (See Comments)    achiness   Rosuvastatin Other (See Comments)    Muscle pain and achiness    Level of Care/Admitting Diagnosis ED Disposition     ED Disposition  Admit   Condition  --   Comment  Hospital Area: [facility] [[id number]]  Level of Care: Telemetry Medical [104]  May admit patient to [facility] or [facility] if equivalent level of care is available:: No  Covid Evaluation: Asymptomatic - no recent exposure (last 10 days) testing not required  Diagnosis: A-fib [facility]) [[id number]]  Admitting Physician: Alfredo Martinez [[id number]]  Attending Physician: Westley Chandler [[id number]]  Certification:: I certify this patient will need inpatient services for at least 2 midnights  Expected Medical Readiness: 12/04/2023          B Medical/Surgery History Past Medical History:  Diagnosis Date   Adenomatous polyp 03/23/2020   Colonoscopy 2021 notable for adenomatous polyp. Per GI, no further follow up colonoscopies recommended given patient's age.   Allergy    Arthritis    Atherosclerosis of aorta (HCC) 07/15/2021   Candidiasis, mouth 10/19/2017   Chronic combined systolic and diastolic CHF (congestive heart failure) (HCC) 10/21/2015   Echo 3/16: EF 40%, diffuse HK, mild MR, moderate LAE, mild RAE, PASP 42 mmHg  //  Echo  12/16: Mild LVH, EF 35-40%, diffuse HK, Gr 2 DD, MAC, mild MR, mod LAE, normal RVSF, PASP 49 mmHg // Echo 5/19: EF 15-20, diff HK, Gr 2 DD, trivial AI, MAC, trivial MR, mod LAE, normal RVSF, mild TR, PASP 62 // Limited Echo 8/19: mod LVH, EF 40-45, diff HK, Gr 1 DD, MAC, mild LAE    Chronic left shoulder pain 04/10/2013   Chronic lung disease    Chronic pain    Chronic pain syndrome 07/20/2017   Confusion 06/08/2021   Depression 08/25/2016   DJD (degenerative joint disease) 10/12/2012   Multiple joint replacements by Dr. Leslee Home.    Dyshidrotic eczema 12/31/2014   Edentulous 07/19/2021   Emphysema lung (HCC) 03/02/2021   Long smoking history.  Noted on CT Low Dose Chest: 01/2021: IMPRESSION: Chronic interstitial changes raise the question of pulmonary fibrosis. High-resolution chest CT may prove helpful to further evaluate as clinically warranted.Emphysema (ICD10-J43.9) and Aortic Atherosclerosis (ICD10-170.0)  Recommended pulmonology referral but patient declined. Recommend continued discussion and symptom monitor   Essential hypertension 07/20/2017   GERD (gastroesophageal reflux disease)    Gout    H/O slipped capital femoral epiphysis (SCFE)    Heart failure, left, with LVEF 41-49% (HCC) 10/21/2015   transthoracic echocardiogram 06/06/18: EF appears improved to 40-45% with diffuse hypokinesis  A. Echo 3/16: EF 40%, diffuse HK, mild MR, moderate LAE, mild RAE, PASP 42 mmHg  //  B. Echo 12/16: Mild LVH, EF 35-40%, diffuse HK, grade 2 diastolic dysfunction, MAC, mild  MR, moderate LAE, normal RVSF, PASP 49 mmHg   History of total right hip replacement 04/02/2019   Followed closely with orthopedist Dr. Charlann Boxer. Patient is s/p total right hip replacement in 1980's with evidence of bone loss and osteopenia making reconstruction unlikely. Plan for conservative treatment at this time with crutches for ambulation and pain control.   Hyperlipidemia    Hypertension    Hypertensive heart disease with CHF (congestive  heart failure) (HCC) 10/12/2012   Loss of taste 02/06/2018   Mass of breast, left 07/21/2014   NICM (nonischemic cardiomyopathy) (HCC) 06/09/2016   A. LHC 7/17: Normal coronary arteries, EF 30-35%, LVEDP 14 mmHg   Pain in gums 04/18/2018   Pre-diabetes    Pulmonary fibrosis (HCC) 03/02/2021   CT Low Dose Chest: 01/2021: IMPRESSION: Chronic interstitial changes raise the question of pulmonary fibrosis. High-resolution chest CT may prove helpful to further evaluate as clinically warranted.Emphysema (ICD10-J43.9) and Aortic Atherosclerosis (ICD10-170.0)  Recommended pulmonology referral but patient declined. Recommend continued discussion and symptom monitoring.   Pulmonary nodule 1 cm or greater in diameter 03/02/2021   CT low dose Chest: 01/2021: 1. Lung-RADS 4A, suspicious. 10 mm posterior left lower lobe pulmonary nodule, possibly scarring. Follow up low-dose chest CT without contrast in 3 months (please use the following order, "CT CHEST LCS NODULE FOLLOW-UP W/O CM") is recommended. Alternatively, PET may be considered when there is a solid component 8mm or larger.  Plan: Follow up low dose CT ordered in 3 mon   Rib pain on left side 09/17/2020   Right hip pain 11/02/2012   Tendonitis, Achilles, left 05/24/2017   Tobacco abuse 09/29/2014   Urge incontinence 07/19/2021   Vitamin D deficiency 12/31/2014   Past Surgical History:  Procedure Laterality Date   CARDIAC CATHETERIZATION  2005   normal-Hochrein   CARDIAC CATHETERIZATION N/A 05/11/2016   Procedure: Right/Left Heart Cath and Coronary Angiography;  Surgeon: Peter M Swaziland, MD;  Location: [facility] INVASIVE CV LAB;  Service: Cardiovascular;  Laterality: N/A;   CESAREAN SECTION     COLONOSCOPY  1990's ??   HIP FUSION Left    left    JOINT REPLACEMENT Bilateral 1991, 1997   [place]   ROTATOR CUFF REPAIR     bilateral   TOOTH EXTRACTION N/A 04/03/2020   Procedure: DENTAL RESTORATION/EXTRACTIONS;  Surgeon: Ocie Doyne, DDS;  Location: [facility] OR;  Service:  Oral Surgery;  Laterality: N/A;   TOTAL HIP ARTHROPLASTY  1987   right. Dr. Leslee Home   TUBAL LIGATION       A IV Location/Drains/Wounds Patient Lines/Drains/Airways Status     Active Line/Drains/Airways     Name Placement date Placement time Site Days   Peripheral IV 12/02/23 20 G Anterior;Left Forearm 12/02/23  0207  Forearm  less than 1   Peripheral IV 12/02/23 22 G Posterior;Right Hand 12/02/23  0242  Hand  less than 1            Intake/Output Last 24 hours  Intake/Output Summary (Last 24 hours) at 12/02/2023 1351 Last data filed at 12/02/2023 1338 Gross per 24 hour  Intake 160.04 ml  Output --  Net 160.04 ml    Labs/Imaging Results for orders placed or performed during the hospital encounter of 12/01/23 (from the past 48 hours)  Basic metabolic panel     Status: None   Collection Time: 12/01/23  3:14 PM  Result Value Ref Range   Sodium 137 135 - 145 mmol/L   Potassium 4.0 3.5 - 5.1 mmol/L  Chloride 103 98 - 111 mmol/L   CO2 22 22 - 32 mmol/L   Glucose, Bld 87 70 - 99 mg/dL    Comment: Glucose reference range applies only to samples taken after fasting for at least 8 hours.   BUN 13 8 - 23 mg/dL   Creatinine, Ser 9.62 0.44 - 1.00 mg/dL   Calcium 9.1 8.9 - 95.2 mg/dL   GFR, Estimated >84 >13 mL/min    Comment: (NOTE) Calculated using the CKD-EPI Creatinine Equation (2021)    Anion gap 12 5 - 15    Comment: Performed at [facility] Lab, [address], [place], [place] [address]  CBC     Status: Abnormal   Collection Time: 12/01/23  3:14 PM  Result Value Ref Range   WBC 4.4 4.0 - 10.5 K/uL    Comment: WHITE COUNT CONFIRMED ON SMEAR   RBC 3.79 (L) 3.87 - 5.11 MIL/uL   Hemoglobin 10.5 (L) 12.0 - 15.0 g/dL   HCT 02.7 (L) 25.3 - 66.4 %   MCV 90.8 80.0 - 100.0 fL   MCH 27.7 26.0 - 34.0 pg   MCHC 30.5 30.0 - 36.0 g/dL   RDW 40.3 (H) 47.4 - 25.9 %   Platelets 265 150 - 400 K/uL   nRBC 0.0 0.0 - 0.2 %    Comment: Performed at [facility], [address] [place], [place] [address]  Brain natriuretic peptide     Status: Abnormal   Collection Time: 12/01/23  3:15 PM  Result Value Ref Range   B Natriuretic Peptide 495.9 (H) 0.0 - 100.0 pg/mL    Comment: Performed at [facility], [address], [place], [place] [address]  Troponin I (High Sensitivity)     Status: None   Collection Time: 12/01/23  3:29 PM  Result Value Ref Range   Troponin I (High Sensitivity) 16 <18 ng/L    Comment: (NOTE) Elevated high sensitivity troponin I (hsTnI) values and significant  changes across serial measurements may suggest ACS but many other  chronic and acute conditions are known to elevate hsTnI results.  Refer to the "Links" section for chest pain algorithms and additional  guidance. Performed at [facility], [address], [place], [place] [address]   Magnesium     Status: None   Collection Time: 12/01/23  3:29 PM  Result Value Ref Range   Magnesium 2.0 1.7 - 2.4 mg/dL    Comment: Performed at [facility] Lab, [address] [place], [place] [address]  Troponin I (High Sensitivity)     Status: None   Collection Time: 12/01/23  5:29 PM  Result Value Ref Range   Troponin I (High Sensitivity) 17 <18 ng/L    Comment: (NOTE) Elevated high sensitivity troponin I (hsTnI) values and significant  changes across serial measurements may suggest ACS but many other  chronic and acute conditions are known to elevate hsTnI results.  Refer to the "Links" section for chest pain algorithms and additional  guidance. Performed at [facility], [address] [place], [place] [address]   CBC with Differential     Status: Abnormal   Collection Time: 12/02/23  4:46 AM  Result Value Ref Range   WBC 4.9 4.0 - 10.5 K/uL   RBC 3.48 (L) 3.87 - 5.11 MIL/uL   Hemoglobin 9.7 (L) 12.0 - 15.0 g/dL   HCT 60.1 (L) 09.3 - 23.5 %   MCV 94.5 80.0 - 100.0  fL   MCH 27.9 26.0 - 34.0 pg   MCHC 29.5 (L) 30.0 - 36.0 g/dL   RDW 16.1 (H) 09.6 - 04.5 %    Platelets 234 150 - 400 K/uL   nRBC 0.0 0.0 - 0.2 %   Neutrophils Relative % 51 %   Neutro Abs 2.5 1.7 - 7.7 K/uL   Lymphocytes Relative 34 %   Lymphs Abs 1.7 0.7 - 4.0 K/uL   Monocytes Relative 11 %   Monocytes Absolute 0.6 0.1 - 1.0 K/uL   Eosinophils Relative 3 %   Eosinophils Absolute 0.2 0.0 - 0.5 K/uL   Basophils Relative 1 %   Basophils Absolute 0.0 0.0 - 0.1 K/uL   Immature Granulocytes 0 %   Abs Immature Granulocytes 0.01 0.00 - 0.07 K/uL    Comment: Performed at [facility], [address] [place], [place] [address]  Basic metabolic panel     Status: Abnormal   Collection Time: 12/02/23  4:46 AM  Result Value Ref Range   Sodium 138 135 - 145 mmol/L   Potassium 3.3 (L) 3.5 - 5.1 mmol/L   Chloride 107 98 - 111 mmol/L   CO2 18 (L) 22 - 32 mmol/L   Glucose, Bld 104 (H) 70 - 99 mg/dL    Comment: Glucose reference range applies only to samples taken after fasting for at least 8 hours.   BUN 14 8 - 23 mg/dL   Creatinine, Ser 1.91 0.44 - 1.00 mg/dL   Calcium 7.9 (L) 8.9 - 10.3 mg/dL   GFR, Estimated >47 >82 mL/min    Comment: (NOTE) Calculated using the CKD-EPI Creatinine Equation (2021)    Anion gap 13 5 - 15    Comment: Performed at [facility] Lab, [address] [place], [place] [address]  Magnesium     Status: None   Collection Time: 12/02/23  4:46 AM  Result Value Ref Range   Magnesium 1.8 1.7 - 2.4 mg/dL    Comment: Performed at [facility] Lab, [address], [place], [place] [address]  TSH     Status: None   Collection Time: 12/02/23  4:46 AM  Result Value Ref Range   TSH 1.395 0.350 - 4.500 uIU/mL    Comment: Performed by a 3rd Generation assay with a functional sensitivity of <=0.01 uIU/mL. Performed at [facility] Lab, [address] [place], [place] [address]   Heparin level (unfractionated)     Status: Abnormal   Collection Time: 12/02/23 10:38 AM  Result Value Ref Range   Heparin Unfractionated 0.71 (H) 0.30 - 0.70 IU/mL     Comment: (NOTE) The clinical reportable range upper limit is being lowered to >1.10 to align with the FDA approved guidance for the current laboratory assay.  If heparin results are below expected values, and patient dosage has  been confirmed, suggest follow up testing of antithrombin III levels. Performed at [facility] Lab, [address] [place], [place] [address]    ECHOCARDIOGRAM COMPLETE Result Date: 12/02/2023    ECHOCARDIOGRAM REPORT   Patient Name:   Donna Herrera Date of Exam: 12/02/2023 Medical Rec #:  [id number]               Height:       65.0 in Accession #:    [id number]              Weight:       169.8 lb Date of Birth:  Nov 14, 1945  BSA:          1.845 m Patient Age:    77 years                BP:           114/68 mmHg Patient Gender: F                       HR:           61 bpm. Exam Location:  Inpatient Procedure: 2D Echo, Cardiac Doppler, Color Doppler and Intracardiac            Opacification Agent Indications:    Dyspnea R06.00  History:        Patient has prior history of Echocardiogram examinations, most                 recent 02/14/2023. CHF and Cardiomyopathy; Risk                 Factors:Hypertension.  Sonographer:    Darlys Gales Referring Phys: [phone] NATHAN PICKERING IMPRESSIONS  1. No evidence of LV thrombus. Left ventricular ejection fraction, by estimation, is 20 to 25%. The left ventricle has severely decreased function. The left ventricle demonstrates global hypokinesis. The left ventricular internal cavity size was moderately dilated. Left ventricular diastolic parameters are indeterminate. Elevated left ventricular end-diastolic pressure.  2. Right ventricular systolic function is severely reduced. The right ventricular size is normal. There is mildly elevated pulmonary artery systolic pressure. The estimated right ventricular systolic pressure is 40.0 mmHg.  3. Left atrial size was moderately dilated.  4. Right atrial size was moderately dilated.   5. The mitral valve is normal in structure. Trivial mitral valve regurgitation. No evidence of mitral stenosis.  6. The tricuspid valve is abnormal. Tricuspid valve regurgitation is moderate.  7. The aortic valve was not well visualized. Aortic valve regurgitation is not visualized. No aortic stenosis is present.  8. Pulmonic valve regurgitation not assessed.  9. The inferior vena cava is normal in size with greater than 50% respiratory variability, suggesting right atrial pressure of 3 mmHg. Comparison(s): Changes from prior study are noted. LVEF worsened from 50-55% to 20-25% now with severe RV dysfunction. FINDINGS  Left Ventricle: No evidence of LV thrombus. Left ventricular ejection fraction, by estimation, is 20 to 25%. The left ventricle has severely decreased function. The left ventricle demonstrates global hypokinesis. The left ventricular internal cavity size was moderately dilated. There is no left ventricular hypertrophy. Left ventricular diastolic parameters are indeterminate. Elevated left ventricular end-diastolic pressure. Right Ventricle: The right ventricular size is normal. No increase in right ventricular wall thickness. Right ventricular systolic function is severely reduced. There is mildly elevated pulmonary artery systolic pressure. The tricuspid regurgitant velocity is 3.04 m/s, and with an assumed right atrial pressure of 3 mmHg, the estimated right ventricular systolic pressure is 40.0 mmHg. Left Atrium: Left atrial size was moderately dilated. Right Atrium: Right atrial size was moderately dilated. Pericardium: There is no evidence of pericardial effusion. Mitral Valve: The mitral valve is normal in structure. Trivial mitral valve regurgitation. No evidence of mitral valve stenosis. Tricuspid Valve: The tricuspid valve is abnormal. Tricuspid valve regurgitation is moderate . No evidence of tricuspid stenosis. Aortic Valve: The aortic valve was not well visualized. Aortic valve  regurgitation is not visualized. No aortic stenosis is present. Aortic valve mean gradient measures 2.0 mmHg. Aortic valve peak gradient measures 3.0 mmHg. Aortic valve area, by VTI measures 2.29  cm. Pulmonic Valve: The pulmonic valve was not assessed. Pulmonic valve regurgitation not assessed. Aorta: The aortic root is normal in size and structure. Venous: The inferior vena cava is normal in size with greater than 50% respiratory variability, suggesting right atrial pressure of 3 mmHg. IAS/Shunts: The interatrial septum was not well visualized.  LEFT VENTRICLE PLAX 2D LVIDd:         4.60 cm   Diastology LVIDs:         4.30 cm   LV e' medial:    5.87 cm/s LV PW:         0.90 cm   LV E/e' medial:  16.8 LV IVS:        1.00 cm   LV e' lateral:   7.40 cm/s LVOT diam:     1.80 cm   LV E/e' lateral: 13.3 LV SV:         34 LV SV Index:   19 LVOT Area:     2.54 cm  RIGHT VENTRICLE RV S prime:     9.46 cm/s LEFT ATRIUM             Index        RIGHT ATRIUM           Index LA Vol (A2C):   42.3 ml 22.93 ml/m  RA Area:     21.20 cm LA Vol (A4C):   30.5 ml 16.53 ml/m  RA Volume:   60.80 ml  32.95 ml/m LA Biplane Vol: 38.7 ml 20.98 ml/m  AORTIC VALVE AV Area (Vmax):    1.81 cm AV Area (Vmean):   1.66 cm AV Area (VTI):     2.29 cm AV Vmax:           86.50 cm/s AV Vmean:          67.000 cm/s AV VTI:            0.150 m AV Peak Grad:      3.0 mmHg AV Mean Grad:      2.0 mmHg LVOT Vmax:         61.50 cm/s LVOT Vmean:        43.700 cm/s LVOT VTI:          0.135 m LVOT/AV VTI ratio: 0.90  AORTA Ao Root diam: 2.90 cm MITRAL VALVE               TRICUSPID VALVE MV Area (PHT): 3.99 cm    TR Peak grad:   37.0 mmHg MV Decel Time: 190 msec    TR Vmax:        304.00 cm/s MV E velocity: 98.50 cm/s MV A velocity: 25.20 cm/s  SHUNTS MV E/A ratio:  3.91        Systemic VTI:  0.14 m                            Systemic Diam: 1.80 cm Vishnu Priya Mallipeddi Electronically signed by Winfield Rast Mallipeddi Signature Date/Time:  12/02/2023/10:49:36 AM    Final    VAS [name] LOWER EXTREMITY VENOUS (DVT) Result Date: 12/02/2023  Lower Venous DVT Study Patient Name:  Donna Herrera  Date of Exam:   12/02/2023 Medical Rec #: [id number]                Accession #:    [id number] Date of Birth: 1946/04/02  Patient Gender: F Patient Age:   70 years Exam Location:  [facility] Procedure:      VAS [name] LOWER EXTREMITY VENOUS (DVT) Referring Phys: CARINA BROWN --------------------------------------------------------------------------------  Indications: Swelling.  Risk Factors: Atrial fibrillation, CHF. Limitations: Body habitus, poor ultrasound/tissue interface, patient unable to position legs, edema and inability to tolerate compression maneuvers. Comparison Study: No prior study Performing Technologist: Sherren Kerns RVS  Examination Guidelines: A complete evaluation includes B-mode imaging, spectral Doppler, color Doppler, and power Doppler as needed of all accessible portions of each vessel. Bilateral testing is considered an integral part of a complete examination. Limited examinations for reoccurring indications may be performed as noted. The reflux portion of the exam is performed with the patient in reverse Trendelenburg.  +-------+---------------+---------+-----------+----------------+---------------+ RIGHT  CompressibilityPhasicitySpontaneityProperties      Thrombus Aging  +-------+---------------+---------+-----------+----------------+---------------+ CFV    Full           Yes      No         pulsatile                                                                 waveforms                       +-------+---------------+---------+-----------+----------------+---------------+ SFJ    Full                                                               +-------+---------------+---------+-----------+----------------+---------------+ FV Prox               Yes      No         pulsatile                                                                  waveforms                       +-------+---------------+---------+-----------+----------------+---------------+ FV Mid                Yes      No         pulsatile                                                                 waveforms                       +-------+---------------+---------+-----------+----------------+---------------+ POP                   Yes      No  pulsatile                                                                 waveforms                       +-------+---------------+---------+-----------+----------------+---------------+ PTV                                                       Not well                                                                  visualized      +-------+---------------+---------+-----------+----------------+---------------+ PERO                                                      Not well                                                                  visualized      +-------+---------------+---------+-----------+----------------+---------------+   +---------+---------------+---------+-----------+---------------+--------------+ LEFT     CompressibilityPhasicitySpontaneityProperties     Thrombus Aging +---------+---------------+---------+-----------+---------------+--------------+ CFV      Full           Yes                 pulsatile                                                                 waveforms                     +---------+---------------+---------+-----------+---------------+--------------+ SFJ      Full                                                             +---------+---------------+---------+-----------+---------------+--------------+ FV Prox                 Yes      No         pulsatile  waveforms                      +---------+---------------+---------+-----------+---------------+--------------+ FV Mid                  Yes      No         pulsatile                                                                 waveforms                     +---------+---------------+---------+-----------+---------------+--------------+ FV Distal                                                  Not well                                                                  visualized     +---------+---------------+---------+-----------+---------------+--------------+ PFV                                                        Not well                                                                  visualized     +---------+---------------+---------+-----------+---------------+--------------+ POP                                                        patent by                                                                 color          +---------+---------------+---------+-----------+---------------+--------------+ PTV                                                        Not well  visualized     +---------+---------------+---------+-----------+---------------+--------------+ PERO                                                       Not well                                                                  visualized     +---------+---------------+---------+-----------+---------------+--------------+    Summary: RIGHT: - There is no evidence of deep vein thrombosis in the lower extremity. However, portions of this examination were limited- see technologist comments above.  pulsatile waveforms  LEFT: - There is no evidence of deep vein thrombosis in the lower extremity. However, portions of this examination were limited- see technologist comments above.  Pulsatile waveforms.  *See table(s)  above for measurements and observations.    Preliminary    CT Angio Chest Pulmonary Embolism (PE) W or WO Contrast Result Date: 12/02/2023 CLINICAL DATA:  Shortness of breath and dizziness.  PE suspected EXAM: CT ANGIOGRAPHY CHEST WITH CONTRAST TECHNIQUE: Multidetector CT imaging of the chest was performed using the standard protocol during bolus administration of intravenous contrast. Multiplanar CT image reconstructions and MIPs were obtained to evaluate the vascular anatomy. RADIATION DOSE REDUCTION: This exam was performed according to the departmental dose-optimization program which includes automated exposure control, adjustment of the mA and/or kV according to patient size and/or use of iterative reconstruction technique. CONTRAST:  75mL OMNIPAQUE IOHEXOL 350 MG/ML SOLN COMPARISON:  Same day chest radiograph and CT 05/08/2023 FINDINGS: Cardiovascular: Cardiomegaly. No pericardial effusion. Negative for acute pulmonary embolism. Normal caliber thoracic aorta. Aortic atherosclerotic calcification. Mediastinum/Nodes: Bowing of the posterior trachea compatible with expiratory phase imaging. Esophagus is unremarkable. No thoracic adenopathy. Lungs/Pleura: Patchy ground-glass opacities in both lungs. Scattered areas scarring and bronchiolectasis, similar to prior. No pleural effusion or pneumothorax. Upper Abdomen: No acute abnormality. 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 No acute fracture. Elevated right hemidiaphragm. Review of the MIP images confirms the above findings. IMPRESSION: 1. Negative for acute pulmonary embolism. 2. Patchy ground-glass opacities in both lungs. I favor this to represent air trapping on expiratory phase imaging. Atypical infection or edema could appear similarly. 3. Cardiomegaly. 4.  Aortic Atherosclerosis (ICD10-I70.0). 5. 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 Electronically Signed   By: Minerva Fester M.D.   On:  12/02/2023 02:41   DG HIP UNILAT WITH PELVIS 2-3 VIEWS RIGHT Result Date: 12/02/2023 CLINICAL DATA:  Right hip pain EXAM: DG HIP (WITH OR WITHOUT PELVIS) 2-3V RIGHT COMPARISON:  11/02/2012 FINDINGS: Postoperative changes with right total hip arthroplasty. Superior subluxation of the femoral head component within the acetabular component, unchanged. Mild acetabula protrusio also unchanged. Bone resorption around the proximal femoral shaft and inter trochanteric region appears unchanged. Cerclage wires are present, some fractured. No change. No acute bony abnormalities are identified. Left hip demonstrates complete fusion. Increased trabecular appearance and mild expansile change in the left hip may indicate Paget's disease or postoperative change. 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 Appearances are unchanged since  prior study. Old fusion of the left hip. No acute bony abnormalities. Electronically Signed   By: Burman Nieves M.D.   On: 12/02/2023 00:58   DG Chest 2 View Result Date: 12/01/2023 CLINICAL DATA:  Shortness of breath.  Dizziness. EXAM: CHEST - 2 VIEW COMPARISON:  11/24/2023 FINDINGS: Lateral view degraded by patient arm position. Moderate to marked right hemidiaphragm elevation again identified. Midline trachea. Moderate cardiomegaly. No pleural effusion or pneumothorax. No congestive failure. Left base scarring. IMPRESSION: Cardiomegaly without congestive failure. Electronically Signed   By: Jeronimo Greaves M.D.   On: 12/01/2023 17:27    Pending Labs Unresulted Labs (From admission, onward)     Start     Ordered   12/03/23 0500  Heparin level (unfractionated)  Daily,   R     Placed in "And" Linked Group   12/02/23 0032   12/03/23 0500  Ferritin  Tomorrow morning,   R        12/02/23 1015   12/03/23 0500  Iron and TIBC  Tomorrow morning,   R        12/02/23 1015   12/03/23  0500  CBC  Tomorrow morning,   R        12/02/23 1015   12/03/23 0500  Magnesium  Tomorrow morning,   R        12/02/23 1015   12/03/23 0500  Basic metabolic panel  Tomorrow morning,   R        12/02/23 1015   12/02/23 2000  Heparin level (unfractionated)  Once-Timed,   TIMED        12/02/23 1210   12/02/23 0441  Respiratory (~20 pathogens) panel by PCR  (Respiratory panel by PCR (~20 pathogens, ~24 hr TAT)  w precautions)  Once,   R        12/02/23 0440            Vitals/Pain Today's Vitals   12/02/23 0933 12/02/23 1038 12/02/23 1045 12/02/23 1058  BP:  (!) 126/106 (!) 136/90   Pulse: 91 92 79   Resp: 17  (!) 22   Temp:    98 F (36.7 C)  TempSrc:    Oral  SpO2: 100%  97%   Weight:      Height:      PainSc:        Isolation Precautions Droplet precaution  Medications Medications  acetaminophen (TYLENOL) tablet 650 mg (has no administration in time range)  albuterol (PROVENTIL) (2.5 MG/3ML) 0.083% nebulizer solution 3 mL (has no administration in time range)  allopurinol (ZYLOPRIM) tablet 100 mg (100 mg Oral Given 12/02/23 1039)  carvedilol (COREG) tablet 3.125 mg (3.125 mg Oral Given 12/02/23 1039)  cholecalciferol (VITAMIN D3) 25 MCG (1000 UNIT) tablet 5,000 Units (5,000 Units Oral Given 12/02/23 1038)  DULoxetine (CYMBALTA) DR capsule 30 mg (30 mg Oral Given 12/02/23 1038)  ezetimibe (ZETIA) tablet 10 mg (10 mg Oral Given 12/02/23 1039)  famotidine (PEPCID) tablet 20 mg (has no administration in time range)  furosemide (LASIX) tablet 20 mg (has no administration in time range)  gabapentin (NEURONTIN) capsule 300 mg (has no administration in time range)  lidocaine (LIDODERM) 5 % 1 patch (1 patch Transdermal Not Given 12/02/23 0246)  oxyCODONE (Oxy IR/ROXICODONE) immediate release tablet 2.5-5 mg (5 mg Oral Given 12/02/23 0257)  heparin ADULT infusion 100 units/mL (25000 units/270mL) (1,000 Units/hr Intravenous Infusion Verify 12/02/23 1338)  hydrALAZINE (APRESOLINE) tablet 50 mg  (50 mg Oral Given 12/02/23 1038)  spironolactone (  ALDACTONE) tablet 12.5 mg (12.5 mg Oral Given 12/02/23 1038)  perflutren lipid microspheres (DEFINITY) IV suspension (2 mLs Intravenous Given 12/02/23 1023)  heparin bolus via infusion 4,000 Units (4,000 Units Intravenous Bolus from Bag 12/02/23 0244)  iohexol (OMNIPAQUE) 350 MG/ML injection 75 mL (75 mLs Intravenous Contrast Given 12/02/23 0231)  potassium chloride (KLOR-CON) packet 60 mEq (60 mEq Oral Given 12/02/23 0629)  magnesium sulfate IVPB 2 g 50 mL (0 g Intravenous Stopped 12/02/23 0936)  metoprolol tartrate (LOPRESSOR) injection 5 mg (5 mg Intravenous Given 12/02/23 0831)    Mobility non-ambulatory     Focused Assessments    R Recommendations: See Admitting Provider Note  Report given to:   Additional Notes:

## 2023-12-02 NOTE — Progress Notes (Signed)
ANTICOAGULATION CONSULT NOTE - Follow-up Note  Pharmacy Consult for Heparin Indication: atrial fibrillation  Allergies  Allergen Reactions   Crab [Shellfish Allergy]     Itching to lips   Morphine And Codeine Itching and Swelling   Atorvastatin Other (See Comments)    achiness   Rosuvastatin Other (See Comments)    Muscle pain and achiness    Patient Measurements: Height: 5\' 5"  (165.1 cm) Weight: 77 kg (169 lb 12.1 oz) IBW/kg (Calculated) : 57 Heparin Dosing Weight: 70 kg  Vital Signs: Temp: 98 F (36.7 C) (02/01 1058) Temp Source: Oral (02/01 1058) BP: 136/90 (02/01 1045) Pulse Rate: 79 (02/01 1045)  Labs: Recent Labs    12/01/23 1514 12/01/23 1529 12/01/23 1729 12/02/23 0446  HGB 10.5*  --   --  9.7*  HCT 34.4*  --   --  32.9*  PLT 265  --   --  234  CREATININE 0.78  --   --  0.78  TROPONINIHS  --  16 17  --     Estimated Creatinine Clearance: 59.5 mL/min (by C-G formula based on SCr of 0.78 mg/dL).   Medical History: Past Medical History:  Diagnosis Date   Adenomatous polyp 03/23/2020   Colonoscopy 2021 notable for adenomatous polyp. Per GI, no further follow up colonoscopies recommended given patient's age.   Allergy    Arthritis    Atherosclerosis of aorta (HCC) 07/15/2021   Candidiasis, mouth 10/19/2017   Chronic combined systolic and diastolic CHF (congestive heart failure) (HCC) 10/21/2015   Echo 3/16: EF 40%, diffuse HK, mild MR, moderate LAE, mild RAE, PASP 42 mmHg  //  Echo 12/16: Mild LVH, EF 35-40%, diffuse HK, Gr 2 DD, MAC, mild MR, mod LAE, normal RVSF, PASP 49 mmHg // Echo 5/19: EF 15-20, diff HK, Gr 2 DD, trivial AI, MAC, trivial MR, mod LAE, normal RVSF, mild TR, PASP 62 // Limited Echo 8/19: mod LVH, EF 40-45, diff HK, Gr 1 DD, MAC, mild LAE    Chronic left shoulder pain 04/10/2013   Chronic lung disease    Chronic pain    Chronic pain syndrome 07/20/2017   Confusion 06/08/2021   Depression 08/25/2016   DJD (degenerative joint disease)  10/12/2012   Multiple joint replacements by Dr. Leslee Home.    Dyshidrotic eczema 12/31/2014   Edentulous 07/19/2021   Emphysema lung (HCC) 03/02/2021   Long smoking history.  Noted on CT Low Dose Chest: 01/2021: IMPRESSION: Chronic interstitial changes raise the question of pulmonary fibrosis. High-resolution chest CT may prove helpful to further evaluate as clinically warranted.Emphysema (ICD10-J43.9) and Aortic Atherosclerosis (ICD10-170.0)  Recommended pulmonology referral but patient declined. Recommend continued discussion and symptom monitor   Essential hypertension 07/20/2017   GERD (gastroesophageal reflux disease)    Gout    H/O slipped capital femoral epiphysis (SCFE)    Heart failure, left, with LVEF 41-49% (HCC) 10/21/2015   transthoracic echocardiogram 06/06/18: EF appears improved to 40-45% with diffuse hypokinesis  A. Echo 3/16: EF 40%, diffuse HK, mild MR, moderate LAE, mild RAE, PASP 42 mmHg  //  B. Echo 12/16: Mild LVH, EF 35-40%, diffuse HK, grade 2 diastolic dysfunction, MAC, mild MR, moderate LAE, normal RVSF, PASP 49 mmHg   History of total right hip replacement 04/02/2019   Followed closely with orthopedist Dr. Charlann Boxer. Patient is s/p total right hip replacement in 1980's with evidence of bone loss and osteopenia making reconstruction unlikely. Plan for conservative treatment at this time with crutches for ambulation and pain control.  Hyperlipidemia    Hypertension    Hypertensive heart disease with CHF (congestive heart failure) (HCC) 10/12/2012   Loss of taste 02/06/2018   Mass of breast, left 07/21/2014   NICM (nonischemic cardiomyopathy) (HCC) 06/09/2016   A. LHC 7/17: Normal coronary arteries, EF 30-35%, LVEDP 14 mmHg   Pain in gums 04/18/2018   Pre-diabetes    Pulmonary fibrosis (HCC) 03/02/2021   CT Low Dose Chest: 01/2021: IMPRESSION: Chronic interstitial changes raise the question of pulmonary fibrosis. High-resolution chest CT may prove helpful to further evaluate as clinically  warranted.Emphysema (ICD10-J43.9) and Aortic Atherosclerosis (ICD10-170.0)  Recommended pulmonology referral but patient declined. Recommend continued discussion and symptom monitoring.   Pulmonary nodule 1 cm or greater in diameter 03/02/2021   CT low dose Chest: 01/2021: 1. Lung-RADS 4A, suspicious. 10 mm posterior left lower lobe pulmonary nodule, possibly scarring. Follow up low-dose chest CT without contrast in 3 months (please use the following order, "CT CHEST LCS NODULE FOLLOW-UP W/O CM") is recommended. Alternatively, PET may be considered when there is a solid component 8mm or larger.  Plan: Follow up low dose CT ordered in 3 mon   Rib pain on left side 09/17/2020   Right hip pain 11/02/2012   Tendonitis, Achilles, left 05/24/2017   Tobacco abuse 09/29/2014   Urge incontinence 07/19/2021   Vitamin D deficiency 12/31/2014    Medications:  (Not in a hospital admission)  Scheduled:   allopurinol  100 mg Oral Daily   carvedilol  3.125 mg Oral BID   cholecalciferol  5,000 Units Oral Daily   DULoxetine  30 mg Oral Daily   ezetimibe  10 mg Oral Daily   [START ON 12/04/2023] furosemide  20 mg Oral Q M,W,F   gabapentin  300 mg Oral QHS   hydrALAZINE  50 mg Oral TID   lidocaine  1 patch Transdermal Q24H   spironolactone  12.5 mg Oral Daily   Infusions:   heparin 1,100 Units/hr (12/02/23 0244)   PRN: acetaminophen, albuterol, famotidine, oxyCODONE, perflutren lipid microspheres (DEFINITY) IV suspension  Assessment: 78yo female c/o dyspnea, dizziness, and productive cough, also notes swelling in BLE >> found to be in Afib (no known h/o Afib). Heparin per pharmacy consult placed for atrial fibrillation.  CTA PE - negative [name] - w/o evidence for DVT but exam limited per note  Patient was not on anticoagulation prior to arrival. Started on 1100 units/hr IV heparin following 4000 unit IV heparin bolus. Resulting heparin level is 0.71 which is borderline supra-therapeutic.  No issues with  infusion or bleeding per RN.  Hgb 10.5>9.7; plt 265>234  Goal of Therapy:  Heparin level 0.3-0.7 units/ml Monitor platelets by anticoagulation protocol: Yes   Plan:  Decrease heparin infusion to 1000 units/hr Check anti-Xa level at 2000 and daily while on heparin Continue to monitor H&H and platelets  Delmar Landau, PharmD, BCPS 12/02/2023 11:04 AM ED Clinical Pharmacist -  [phone]

## 2023-12-02 NOTE — Assessment & Plan Note (Deleted)
BP in the 120-140/70-110s. On Coreg 3.125 mg BID, Hydralazine 50 TID, and Spironolactone 12.5 mg. - Continue home Coreg 3.125 mg BID to help with BP and HR control - Restart home meds as indicated by pressures

## 2023-12-02 NOTE — Progress Notes (Incomplete)
     Daily Progress Note Intern Pager: [phone]  Patient name: Donna Herrera Medical record number: [id number] Date of birth: 08-13-1946 Age: 78 y.o. Gender: female  Primary Care Provider: Penne Lash, MD Consultants: Cardiology Code Status: Full code  Pt Overview and Major Events to Date:  1/31: Admitted to [facility] TS  Assessment and Plan:  Donna Herrera is a 78 year old female with PMH of aortic atherosclerosis, COPD, cervical DDD, GERD, CHF, ambulation with colitis, vitamin D deficiency, prediabetes admitted for new onset A-fib.  Assessment & Plan New onset atrial fibrillation (HCC) Currently in A-fib even rate controlled.  Started on  long-term anticoagulation given CHA2DS2-VASc of 5.  Cardiology planning TEE and cardioversion prior to discharge. -Cardiology following, appreciate recs - Heparin, transition to DOAC as able - Replete electrolyte goals: K>4 and Mag>2 (HFpEF) heart failure with preserved ejection fraction (HCC) Appears to be euvolemic, echo shows severely reduced EF of 20-25%.  Given 1 dose of 40 mg Lasix yesterday with good UOP. - Daily weights and strict I/O - Continue home spironolactone, Coreg 3.125 mg BID Bilateral leg pain Chronic, lateral Doppler ultrasound negative for DVT. Normocytic anemia New onset over the past 6 months, iron study pending  Essential hypertension BP in the 120-140/70-110s. On Coreg 3.125 mg BID, Hydralazine 50 TID, and Spironolactone 12.5 mg. - Continue home Coreg 3.125 mg BID to help with BP and HR control - Restart home meds as indicated by pressures    Chronic and Stable Problems: ***   FEN/GI: Regular diet PPx: Heparin Dispo:Home pending clinical improvement .   Subjective:  ***  Objective: Temp:  [97.6 F (36.4 C)-98.5 F (36.9 C)] 98.5 F (36.9 C) (02/01 1926) Pulse Rate:  [52-140] 100 (02/01 1926) Resp:  [13-26] 17 (02/01 1926) BP: (114-154)/(68-118) 132/97 (02/01 1926) SpO2:  [95 %-100  %] 97 % (02/01 1926) Weight:  [75.8 kg] 75.8 kg (02/01 1558) Physical Exam: General: *** Cardiovascular: *** Respiratory: *** Abdomen: *** Extremities: ***  Laboratory: Most recent CBC Lab Results  Component Value Date   WBC 4.9 12/02/2023   HGB 9.7 (L) 12/02/2023   HCT 32.9 (L) 12/02/2023   MCV 94.5 12/02/2023   PLT 234 12/02/2023   Most recent BMP    Latest Ref Rng & Units 12/02/2023    6:14 PM  BMP  Glucose 70 - 99 mg/dL 147   BUN 8 - 23 mg/dL 13   Creatinine 8.29 - 1.00 mg/dL 5.62   Sodium 130 - 865 mmol/L 135   Potassium 3.5 - 5.1 mmol/L 4.8   Chloride 98 - 111 mmol/L 100   CO2 22 - 32 mmol/L 25   Calcium 8.9 - 10.3 mg/dL 9.5     Other pertinent labs ***   Imaging/Diagnostic Tests: Radiologist Impression: *** My interpretation: Jerre Simon, MD 12/02/2023, 10:53 PM  PGY-***, [facility] FPTS Intern pager: [phone], text pages welcome Secure chat group [facility] Teaching Service

## 2023-12-02 NOTE — Progress Notes (Signed)
VASCULAR LAB    Bilateral lower extremity venous duplex has been performed.  See CV proc for preliminary results.   Deshon Hsiao, RVT 12/02/2023, 10:30 AM

## 2023-12-02 NOTE — Assessment & Plan Note (Addendum)
Chronic, likely in the setting of dependent edema.  Low concern for DVT at this time but will follow-up imaging. - B/l DVT US

## 2023-12-02 NOTE — Assessment & Plan Note (Signed)
Appears to be euvolemic, echo shows severely reduced EF of 20-25%.  Given 1 dose of 40 mg Lasix yesterday with good UOP. - Daily weights and strict I/O - Continue home spironolactone, Coreg 3.125 mg BID

## 2023-12-02 NOTE — Assessment & Plan Note (Addendum)
Likely secondary to bilateral leg edema, however pain with calf squeeze R>L. Will rule out DVT. Could try ted hose but likely would not tolerate  - B/l DVT US

## 2023-12-02 NOTE — Assessment & Plan Note (Addendum)
Appears to be euvolemic, low concern for exacerbation at this time. - Daily weights and strict I/O - Continue home spironolactone, Coreg 3.125 mg BID, Lasix 20 mg MWF

## 2023-12-02 NOTE — Assessment & Plan Note (Addendum)
Patient presented with worsening dyspnea over the past two weeks. EKG demonstrated new onset atrial fibrillation likely contributing to her SOB. RVP negative at [facility] on 1/24. CXR negative today for acute pathology. - Admit to FMTS, attending Dr. Manson Passey - Med-Tele, Vital signs per floor - Regular diet  - PT/OT to treat - Cardiac monitoring - Heparin, transition to DOAC as able - AM CBC, BMP, Mag, TSH - RVP to rule out URI - CTPE to rule out PE - Echo - Ambulatory pulse ox in AM - Follow up with cardiology OP vs IP as indicated - Follows with [facility]

## 2023-12-02 NOTE — Evaluation (Signed)
Physical Therapy Evaluation Patient Details Name: Donna Herrera MRN: [id number] DOB: [birth year] Today's Date: 12/02/2023  History of Present Illness  Pt is a 78 y.o. female who presented 12/01/23 with worsening dyspnea x2 weeks and R hip pain. Imaging negative for PE. EKG demonstrated new onset atrial fibrillation. Imaging also showed previous R hip arthroplasty with acetabular protrusion and superior subluxation as well as bone resorption around the proximal femur. PMH: atherosclerosis of aorta, CHF, depression, DJD, dyshidrotic eczema, HTN, GERD, gout, HLD, HTN, pulmonary fibrosis, pre-diabetes, L breast mass, pulmonary nodule   Clinical Impression  Pt presents with condition above and deficits mentioned below, see PT Problem List. PTA, she was mod I using her crutches (been using crutches since she was 78 y.o. due to "bone disease") vs a rollator for functional mobility. She needs intermittent assistance to lift her legs with transitioning sit to supine. She lives with her daughter and son-in-law in a 1-level house with a ramped entrance option. Both her daughter and son-in-law are limited physically in the amount of assistance they can provide due to their own health issues. Her granddaughter intermittently comes over to assist. Currently, the pt is needing minA for supine to sit, modA for sit to supine, CGA for safety with transfers from elevated surfaces (does not transfer from low surfaces at baseline), and CGA to ambulate with her crutches. She demonstrates deficits in bil lower extremity strength and ROM (L more limited in ROM than R), some of which is chronic. She could benefit from follow-up with HHPT to address her deficits to improve her joint stability and ideally her pain and stability with mobility. Will continue to follow acutely.      If plan is discharge home, recommend the following: A little help with bathing/dressing/bathroom;Assistance with cooking/housework;Assist for  transportation;Help with stairs or ramp for entrance   Can travel by private vehicle        Equipment Recommendations None recommended by PT  Recommendations for Other Services       Functional Status Assessment Patient has had a recent decline in their functional status and demonstrates the ability to make significant improvements in function in a reasonable and predictable amount of time.     Precautions / Restrictions Precautions Precautions: Fall Restrictions Weight Bearing Restrictions Per Provider Order: No Other Position/Activity Restrictions: Dr. Ardyth Harps cleared pt for OOB prior to bil lower extremity ultrasounds 12/02/23      Mobility  Bed Mobility Overal bed mobility: Needs Assistance Bed Mobility: Supine to Sit, Sit to Supine     Supine to sit: Min assist Sit to supine: Mod assist   General bed mobility comments: HHA for truncal elevation and mod A to bring BLE back into bed with pt demonstrating limited initiation of BLE off floor.    Transfers Overall transfer level: Needs assistance Equipment used: Crutches Transfers: Sit to/from Stand Sit to Stand: Contact guard assist, From elevated surface           General transfer comment: CGA up from elevated stretcher bed. pt asking for bed to be raised despite bed already high. Noting decreased hip/knee flexion on L to allow her to rise from lower surfaces (defer to PT evaluation for ROM). Pt reports she does not ever get up from lower surfaces.    Ambulation/Gait Ambulation/Gait assistance: Contact guard assist Gait Distance (Feet): 60 Feet Assistive device: Crutches Gait Pattern/deviations: Step-to pattern, Decreased stride length, Decreased dorsiflexion - right, Decreased dorsiflexion - left, Trunk flexed Gait velocity: reduced Gait velocity interpretation: <1.8  ft/sec, indicate of risk for recurrent falls   General Gait Details: Pt ambulates slowly with her crutches, intermittently moving her crutches  simulatenously and other times moving 1 at a time then stepping each foot 1 at a time before moving crutches again. No LOB, CGA for safety  Stairs            Wheelchair Mobility     Tilt Bed    Modified Rankin (Stroke Patients Only)       Balance Overall balance assessment: Needs assistance Sitting-balance support: No upper extremity supported, Feet supported Sitting balance-Leahy Scale: Good     Standing balance support: Bilateral upper extremity supported, No upper extremity supported, During functional activity Standing balance-Leahy Scale: Fair Standing balance comment: statically standing at sink without UE support, no LOB; reliant on crutches to ambulate                             Pertinent Vitals/Pain Pain Assessment Pain Assessment: 0-10 Pain Score: 6  Pain Location: R hip Pain Descriptors / Indicators: Aching Pain Intervention(s): Limited activity within patient's tolerance, Monitored during session, Repositioned    Home Living Family/patient expects to be discharged to:: Private residence Living Arrangements: Children (daughter and son-in-law (both handicapped though)) Available Help at Discharge: Family (daughter and son-in-law both handicapped and limited in support they can provide pt) Type of Home: House Home Access: Stairs to enter;Ramped entrance Entrance Stairs-Rails: Right Entrance Stairs-Number of Steps: 4   Home Layout: One level Home Equipment: Grab bars - tub/shower;Grab bars - toilet;Crutches;Rollator (4 wheels);Cane - single point;BSC/3in1;Hospital bed Additional Comments: wears glasses all the time    Prior Function Prior Level of Function : Needs assist       Physical Assist : ADLs (physical);Mobility (physical) Mobility (physical): Bed mobility ADLs (physical): Dressing;IADLs Mobility Comments: Uses crutches (since she was 78 y.o. due to bone disease per pt) vs rollator mod I; assist for legs back into bed ADLs  Comments: Pt's daughter and son-in-law are both handicapped and pt does the cooking. They have someone come to clean the house 1x every other week. Bathes and dresses herself except granddaughter helps with shoes/socks. Granddaughter or sister-in-law drives her to appointments. Gets groceries delivered. Senior care also brings her food     Extremity/Trunk Assessment   Upper Extremity Assessment Upper Extremity Assessment: Right hand dominant;Defer to OT evaluation RUE Deficits / Details: Pt reports bil rotator cuff tears. Able to lift RUE functionally to ~160 degrees shoulder flexion. LUE Deficits / Details: Pt reports bilat rotator cuff tears. Pt more impaired on L with ongoing shoulder pain and able to flex shoulder to ~90 degrees with notable compensatory abduction as well.    Lower Extremity Assessment Lower Extremity Assessment: Generalized weakness;RLE deficits/detail;LLE deficits/detail RLE Deficits / Details: reports R hip pain, limiting strength and ROM; grossly 4 to 4+ strength LLE Deficits / Details: reports hx of L leg shorter than R; decreased ROM noted in hip and knee compared to R leg; grossly 4 to 4+ strength    Cervical / Trunk Assessment Cervical / Trunk Assessment: Normal  Communication   Communication Communication: Hearing impairment  Cognition Arousal: Alert Behavior During Therapy: WFL for tasks assessed/performed Overall Cognitive Status: Within Functional Limits for tasks assessed  General Comments General comments (skin integrity, edema, etc.): afib reading on monitor throughout with HR 80s-140    Exercises     Assessment/Plan    PT Assessment Patient needs continued PT services  PT Problem List Decreased strength;Decreased range of motion;Decreased activity tolerance;Decreased balance;Decreased mobility;Cardiopulmonary status limiting activity;Pain       PT Treatment Interventions Gait  training;DME instruction;Stair training;Functional mobility training;Therapeutic activities;Therapeutic exercise;Balance training;Neuromuscular re-education;Patient/family education    PT Goals (Current goals can be found in the Care Plan section)  Acute Rehab PT Goals Patient Stated Goal: to reduce pain PT Goal Formulation: With patient Time For Goal Achievement: 12/16/23 Potential to Achieve Goals: Good    Frequency Min 1X/week     Co-evaluation PT/OT/SLP Co-Evaluation/Treatment: Yes Reason for Co-Treatment: For patient/therapist safety;To address functional/ADL transfers;Other (comment) (pt initially reluctant to mobilize and did not believe pt would agree to 2 separate sessions today) PT goals addressed during session: Mobility/safety with mobility;Balance;Proper use of DME         AM-PAC PT "6 Clicks" Mobility  Outcome Measure Help needed turning from your back to your side while in a flat bed without using bedrails?: A Little Help needed moving from lying on your back to sitting on the side of a flat bed without using bedrails?: A Little Help needed moving to and from a bed to a chair (including a wheelchair)?: A Little Help needed standing up from a chair using your arms (e.g., wheelchair or bedside chair)?: A Little Help needed to walk in hospital room?: A Little Help needed climbing 3-5 steps with a railing? : A Lot 6 Click Score: 17    End of Session Equipment Utilized During Treatment: Gait belt Activity Tolerance: Patient tolerated treatment well Patient left: in bed;with call bell/phone within reach   PT Visit Diagnosis: Unsteadiness on feet (R26.81);Other abnormalities of gait and mobility (R26.89);Muscle weakness (generalized) (M62.81);Difficulty in walking, not elsewhere classified (R26.2);Pain Pain - Right/Left: Right Pain - part of body: Hip    Time: 2841-3244 PT Time Calculation (min) (ACUTE ONLY): 31 min   Charges:   PT Evaluation $PT Eval Moderate  Complexity: 1 Mod   PT General Charges $$ ACUTE PT VISIT: 1 Visit         Virgil Benedict, PT, DPT Acute Rehabilitation Services  Office: [phone]   Bettina Gavia 12/02/2023, 5:16 PM

## 2023-12-02 NOTE — Assessment & Plan Note (Signed)
New onset over the past 6 months, iron study pending

## 2023-12-02 NOTE — Progress Notes (Signed)
PHARMACY - ANTICOAGULATION CONSULT NOTE  Pharmacy Consult for heparin Indication:  new-onset Afib and r/o PE  Allergies  Allergen Reactions   Crab [Shellfish Allergy]     Itching to lips   Morphine And Codeine Itching and Swelling   Atorvastatin Other (See Comments)    achiness    Patient Measurements: Height: 5\' 5"  (165.1 cm) Weight: 77 kg (169 lb 12.1 oz) IBW/kg (Calculated) : 57 Heparin Dosing Weight: 70kg  Vital Signs: Temp: 98.2 F (36.8 C) (01/31 2311) Temp Source: Oral (01/31 2311) BP: 140/71 (01/31 2330) Pulse Rate: 54 (01/31 2330)  Labs: Recent Labs    12/01/23 1514 12/01/23 1529 12/01/23 1729  HGB 10.5*  --   --   HCT 34.4*  --   --   PLT 265  --   --   CREATININE 0.78  --   --   TROPONINIHS  --  16 17    Estimated Creatinine Clearance: 59.5 mL/min (by C-G formula based on SCr of 0.78 mg/dL).   Medical History: Past Medical History:  Diagnosis Date   Adenomatous polyp 03/23/2020   Colonoscopy 2021 notable for adenomatous polyp. Per GI, no further follow up colonoscopies recommended given patient's age.   Allergy    Arthritis    Atherosclerosis of aorta (HCC) 07/15/2021   Candidiasis, mouth 10/19/2017   Chronic combined systolic and diastolic CHF (congestive heart failure) (HCC) 10/21/2015   Echo 3/16: EF 40%, diffuse HK, mild MR, moderate LAE, mild RAE, PASP 42 mmHg  //  Echo 12/16: Mild LVH, EF 35-40%, diffuse HK, Gr 2 DD, MAC, mild MR, mod LAE, normal RVSF, PASP 49 mmHg // Echo 5/19: EF 15-20, diff HK, Gr 2 DD, trivial AI, MAC, trivial MR, mod LAE, normal RVSF, mild TR, PASP 62 // Limited Echo 8/19: mod LVH, EF 40-45, diff HK, Gr 1 DD, MAC, mild LAE    Chronic left shoulder pain 04/10/2013   Chronic lung disease    Chronic pain    Chronic pain syndrome 07/20/2017   Confusion 06/08/2021   Depression 08/25/2016   DJD (degenerative joint disease) 10/12/2012   Multiple joint replacements by Dr. Leslee Home.    Dyshidrotic eczema 12/31/2014   Edentulous  07/19/2021   Emphysema lung (HCC) 03/02/2021   Long smoking history.  Noted on CT Low Dose Chest: 01/2021: IMPRESSION: Chronic interstitial changes raise the question of pulmonary fibrosis. High-resolution chest CT may prove helpful to further evaluate as clinically warranted.Emphysema (ICD10-J43.9) and Aortic Atherosclerosis (ICD10-170.0)  Recommended pulmonology referral but patient declined. Recommend continued discussion and symptom monitor   Essential hypertension 07/20/2017   GERD (gastroesophageal reflux disease)    Gout    H/O slipped capital femoral epiphysis (SCFE)    Heart failure, left, with LVEF 41-49% (HCC) 10/21/2015   transthoracic echocardiogram 06/06/18: EF appears improved to 40-45% with diffuse hypokinesis  A. Echo 3/16: EF 40%, diffuse HK, mild MR, moderate LAE, mild RAE, PASP 42 mmHg  //  B. Echo 12/16: Mild LVH, EF 35-40%, diffuse HK, grade 2 diastolic dysfunction, MAC, mild MR, moderate LAE, normal RVSF, PASP 49 mmHg   History of total right hip replacement 04/02/2019   Followed closely with orthopedist Dr. Charlann Boxer. Patient is s/p total right hip replacement in 1980's with evidence of bone loss and osteopenia making reconstruction unlikely. Plan for conservative treatment at this time with crutches for ambulation and pain control.   Hyperlipidemia    Hypertension    Hypertensive heart disease with CHF (congestive heart failure) (HCC) 10/12/2012  Loss of taste 02/06/2018   Mass of breast, left 07/21/2014   NICM (nonischemic cardiomyopathy) (HCC) 06/09/2016   A. LHC 7/17: Normal coronary arteries, EF 30-35%, LVEDP 14 mmHg   Pain in gums 04/18/2018   Pre-diabetes    Pulmonary fibrosis (HCC) 03/02/2021   CT Low Dose Chest: 01/2021: IMPRESSION: Chronic interstitial changes raise the question of pulmonary fibrosis. High-resolution chest CT may prove helpful to further evaluate as clinically warranted.Emphysema (ICD10-J43.9) and Aortic Atherosclerosis (ICD10-170.0)  Recommended pulmonology  referral but patient declined. Recommend continued discussion and symptom monitoring.   Pulmonary nodule 1 cm or greater in diameter 03/02/2021   CT low dose Chest: 01/2021: 1. Lung-RADS 4A, suspicious. 10 mm posterior left lower lobe pulmonary nodule, possibly scarring. Follow up low-dose chest CT without contrast in 3 months (please use the following order, "CT CHEST LCS NODULE FOLLOW-UP W/O CM") is recommended. Alternatively, PET may be considered when there is a solid component 8mm or larger.  Plan: Follow up low dose CT ordered in 3 mon   Rib pain on left side 09/17/2020   Right hip pain 11/02/2012   Tendonitis, Achilles, left 05/24/2017   Tobacco abuse 09/29/2014   Urge incontinence 07/19/2021   Vitamin D deficiency 12/31/2014    Assessment: 78yo female c/o dyspnea, dizziness, and productive cough, also notes swelling in BLE >> found to be in Afib (no known h/o Afib), also concern for PE and awaiting CT >> to begin heparin.  Goal of Therapy:  Heparin level 0.3-0.7 units/ml Monitor platelets by anticoagulation protocol: Yes   Plan:  Heparin 4000 units IV bolus followed by infusion at 1100 units/hr. Monitor heparin levels and CBC; f/u CT results.  Vernard Gambles, PharmD, BCPS  12/02/2023,12:27 AM

## 2023-12-02 NOTE — Progress Notes (Signed)
Daily Progress Note Intern Pager: [phone]  Patient name: Donna Herrera Medical record number: [id number] Date of birth: 01/15/1946 Age: 78 y.o. Gender: female  Primary Care Provider: Penne Lash, MD Consultants: None Code Status: Full code  Pt Overview and Major Events to Date:  1/31: Admitted to [facility]  Assessment and Plan: Donna Herrera is a 78 y.o. female with past medical hx of Aortic atherosclerosis, high fall risk, COPD? Cervical DDD, DJD, GERD, CHF, NICM, ambulates with crutches, vit D deficiency, prediabetes, OA, pulmonary fibrosis, history of hip replacement presenting with new onset A-fib.  Continues to be in A-fib, intermittent episodes of RVR.  Given she has likely been in A-fib for some time given ongoing dyspnea, may not be a candidate for cardioversion.  Cardiology consulted and appreciate further recommendations. Assessment & Plan New onset atrial fibrillation (HCC) Remains in Afib, mostly rate controlled with episodes of RVR.  Consult cardiology for discussion about cardioversion although patient has likely been in A-fib for some time.  Will need long-term anticoagulation given CHA2DS2-VASc of 5 and discussed ongoing need of ASA with cardiology. - Consult Cardiology - Heparin, transition to DOAC as able - Echo - Replete electrolyte goals: K>4 and Mag>2 (HFpEF) heart failure with preserved ejection fraction (HCC) Appears to be euvolemic, low concern for exacerbation at this time. - Daily weights and strict I/O - Continue home spironolactone, Coreg 3.125 mg BID, Lasix 20 mg MWF  Bilateral leg pain Chronic, likely in the setting of dependent edema.  Low concern for DVT at this time but will follow-up imaging. - B/l DVT US Normocytic anemia New onset over the past 6 months, no sources of bleeding.  Last colonoscopy in 2021, no further recommended screening per GI. - Iron studies  Chronic and Stable Problems:  COPD: Albuterol prn  Gout:  Continue home allopurinol. Chronic Right Hip Pain: Takes Oxycodone 5 mg q6h PRN at home. Added on Oxycodone 2.5-5mg  q6h PRN for moderate/severe pain and Lidocaine patch q24h. Anxiety/Depression: Continue Cymbalta 30 mg daily Vitamin D deficiency: Contine D3 5000u daily HLD: Continue Zetia 10 mg daily.  Lipid panel repeated 4 weeks ago demonstrated LDL 138. GERD: Continue Famotidine 20 mg daily PRN Neuropathy: Continue Gabapentin 300 mg nightly Prediabetes: A1c 4 weeks ago 6.0.  HTN: Mildly hypertensive, will continue home Coreg, hydralazine and spironolactone   FEN/GI: Regular Diet VTE Prophylaxis: Heparin Dispo: Home pending cardiology recommendations  Subjective:  Assessed at bedside, granddaughter present.  Denies chest pain and worsening dyspnea.  Has had ongoing dyspnea likely over the past couple of weeks.  Reports lower extremity edema is chronic and fluctuates with use of compression stockings.  Objective: Temp:  [98 F (36.7 C)-98.2 F (36.8 C)] 98 F (36.7 C) (02/01 0345) Pulse Rate:  [52-121] 101 (02/01 0630) Resp:  [13-26] 23 (02/01 0630) BP: (110-154)/(69-118) 121/82 (02/01 0630) SpO2:  [95 %-100 %] 100 % (02/01 0630) Weight:  [77 kg-77.9 kg] 77 kg (01/31 1512) Physical Exam: General: Sitting up in bed, alert, NAD Cardiovascular: Irregularly irregular rhythm.  Mostly regularly with episodes of tachycardia to 140s.  No murmur. Respiratory: CTAB.  Normal work of breathing on room air Abdomen: Soft, nontender, nondistended. Extremities: 1-2+ pitting edema bilaterally.  TTP over right hip/groin.  Laboratory: Most recent CBC Lab Results  Component Value Date   WBC 4.9 12/02/2023   HGB 9.7 (L) 12/02/2023   HCT 32.9 (L) 12/02/2023   MCV 94.5 12/02/2023   PLT 234 12/02/2023   Most recent BMP  Latest Ref Rng & Units 12/02/2023    4:46 AM  BMP  Glucose 70 - 99 mg/dL 161   BUN 8 - 23 mg/dL 14   Creatinine 0.96 - 1.00 mg/dL 0.45   Sodium 409 - 811 mmol/L 138    Potassium 3.5 - 5.1 mmol/L 3.3   Chloride 98 - 111 mmol/L 107   CO2 22 - 32 mmol/L 18   Calcium 8.9 - 10.3 mg/dL 7.9     Other pertinent labs:  BNP: 495 Troponin: 16 > 17 TSH: 1.395 Quad screen: Negative  Imaging/Diagnostic Tests: CT Angio Chest Pulmonary Embolism (PE) W or WO Contrast Result Date: 12/02/2023 IMPRESSION: 1. Negative for acute pulmonary embolism. 2. Patchy ground-glass opacities in both lungs. I favor this to represent air trapping on expiratory phase imaging. Atypical infection or edema could appear similarly. 3. Cardiomegaly. 4.  Aortic Atherosclerosis (ICD10-I70.0). 5. 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   DG HIP UNILAT WITH PELVIS 2-3 VIEWS RIGHT Result Date: 12/02/2023 IMPRESSION: Previous right hip arthroplasty with acetabular protrusio and superior subluxation as well as bone resorption around the proximal femur. Appearances are unchanged since prior study. Old fusion of the left hip. No acute bony abnormalities.   DG Chest 2 View Result Date: 12/01/2023 IMPRESSION: Cardiomegaly without congestive failure.     Elberta Fortis, MD 12/02/2023, 7:39 AM  PGY-2, [facility] FPTS Intern pager: [phone], text pages welcome Secure chat group [facility] Teaching Service

## 2023-12-02 NOTE — Assessment & Plan Note (Addendum)
Remains in Afib, mostly rate controlled with episodes of RVR.  Consult cardiology for discussion about cardioversion although patient has likely been in A-fib for some time.  Will need long-term anticoagulation given CHA2DS2-VASc of 5 and discussed ongoing need of ASA with cardiology. - Consult Cardiology - Heparin, transition to DOAC as able - Echo - Replete electrolyte goals: K>4 and Mag>2

## 2023-12-02 NOTE — Assessment & Plan Note (Signed)
Chronic, lateral Doppler ultrasound negative for DVT.

## 2023-12-02 NOTE — Assessment & Plan Note (Addendum)
S/p R total hip replacement. Has a hx of SCFE and DJD. Complains of R hip pain to light palpation. "Old fusion of L hip, R hip arthroplasty with acetabular protrusion & superior subluxation & bone resorption around the proximal femur." Follows orthopedics outpatient.  - R hip XR - Pain regimen: Lidocaine patch q24h. Tylenol for mild pain and Oxycodone 2.5-5 mg for moderate/severe pain, K pad as tolerated, consider Voltaren gel  - PT/OT - OOB as tolerated

## 2023-12-02 NOTE — Assessment & Plan Note (Signed)
Currently in A-fib even rate controlled.  Started on  long-term anticoagulation given CHA2DS2-VASc of 5.  Cardiology planning TEE and cardioversion prior to discharge. -Cardiology following, appreciate recs - Heparin, transition to DOAC as able - Replete electrolyte goals: K>4 and Mag>2

## 2023-12-02 NOTE — Plan of Care (Signed)
Spoke with cardiologist on-call, Dr. Elberta Fortis, about new onset A-fib possibility for cardioversion.  They will consult on her case.  Elberta Fortis, MD 12/02/2023, 1:19 PM PGY-2, [facility] Family Medicine Service pager [phone]

## 2023-12-02 NOTE — ED Notes (Addendum)
Pt's in AFIB and HR is up to 140-150. MD made aware via secure chat. Metoprolol 5mg  given.

## 2023-12-02 NOTE — Assessment & Plan Note (Addendum)
BP in the 120-140/70-110s. On Coreg 3.125 mg BID, Hydralazine 50 TID, and Spironolactone 12.5 mg. - Continue home Coreg 3.125 mg BID to help with BP and HR control - Restart home meds as indicated by pressures

## 2023-12-02 NOTE — Assessment & Plan Note (Signed)
>>  ASSESSMENT AND PLAN FOR LONGSTANDING PERSISTENT ATRIAL FIBRILLATION (HCC) WRITTEN ON 12/02/2023  2:12 PM BY HERNANDEZ, KATIE, MD  Remains in Afib, mostly rate controlled with episodes of RVR.  Consult cardiology for discussion about cardioversion although patient has likely been in A-fib for some time.  Will need long-term anticoagulation given CHA2DS2-VASc of 5 and discussed ongoing need of ASA with cardiology. - Consult Cardiology - Heparin , transition to DOAC as able - Echo - Replete electrolyte goals: K>4 and Mag>2

## 2023-12-02 NOTE — Evaluation (Signed)
Occupational Therapy Evaluation Patient Details Name: Donna Herrera MRN: [id number] DOB: 09-Nov-1945 Today's Date: 12/02/2023   History of Present Illness Pt is a 78 y.o. female who presented 12/01/23 with worsening dyspnea x2 weeks and R hip pain. Imaging negative for PE. EKG demonstrated new onset atrial fibrillation. Imaging also showed previous R hip arthroplasty with acetabular protrusion and superior subluxation as well as bone resorption around the proximal femur. PMH: atherosclerosis of aorta, CHF, depression, DJD, dyshidrotic eczema, HTN, GERD, gout, HLD, HTN, pulmonary fibrosis, pre-diabetes, L breast mass, pulmonary nodule   Clinical Impression   PTA, pt's daughter and son in law lived with her; they used to assist her but both are unable to at this time secondary to their own health state. Pt usually does the cooking at her house; cleaning services come every other week. Grand daughter assists with LB ADL and transportation. Upon eval, pt reports she is close to baseline function; observed with weakness, decreased strength, balance, safety, shoulder ROM and strength that she reports affects her ability to groom. Pt agreeable HHOT at discharge to optimize safety and independence with ADL as well as reduce caregiver burden on grand daughter.       If plan is discharge home, recommend the following: A little help with walking and/or transfers;A little help with bathing/dressing/bathroom;Assistance with cooking/housework;Assist for transportation;Help with stairs or ramp for entrance    Functional Status Assessment  Patient has had a recent decline in their functional status and demonstrates the ability to make significant improvements in function in a reasonable and predictable amount of time.  Equipment Recommendations  None recommended by OT (pt to benefit from education regarding AE available)    Recommendations for Other Services       Precautions / Restrictions  Precautions Precautions: Fall Restrictions Weight Bearing Restrictions Per Provider Order: No      Mobility Bed Mobility Overal bed mobility: Needs Assistance Bed Mobility: Supine to Sit, Sit to Supine     Supine to sit: Min assist Sit to supine: Mod assist   General bed mobility comments: HHA for truncal elevation and mod A to bring BLE back into bed with pt demonstrating limited initiation of BLE off floor.    Transfers Overall transfer level: Needs assistance Equipment used: Crutches, None Transfers: Sit to/from Stand Sit to Stand: Contact guard assist           General transfer comment: CGA up from elevated stretcher bed. pt asking for bed to be raised despite bed already high. Noting decreased hip/knee flexion on L to allow her to rise from lower surfaces (defer to PT evaluation for ROM). Pt reports she does not ever get up from lower surfaces.      Balance Overall balance assessment: Needs assistance Sitting-balance support: No upper extremity supported, Feet supported Sitting balance-Leahy Scale: Good     Standing balance support: Bilateral upper extremity supported, During functional activity, No upper extremity supported Standing balance-Leahy Scale: Fair Standing balance comment: statically standing at sink                           ADL either performed or assessed with clinical judgement   ADL Overall ADL's : Needs assistance/impaired Eating/Feeding: Set up;Sitting   Grooming: Contact guard assist;Standing   Upper Body Bathing: Set up;Sitting   Lower Body Bathing: Maximal assistance;Sit to/from stand   Upper Body Dressing : Set up;Sitting   Lower Body Dressing: Maximal assistance;Sit to/from stand   Toilet  Transfer: Contact guard assist;Ambulation (cruteches)           Functional mobility during ADLs: Contact guard assist (crutches)       Vision Ability to See in Adequate Light: 0 Adequate Patient Visual Report: No change from  baseline Vision Assessment?: No apparent visual deficits     Perception Perception: Not tested       Praxis Praxis: Not tested       Pertinent Vitals/Pain Pain Assessment Pain Assessment: 0-10 Pain Score: 6  Pain Location: R hip Pain Descriptors / Indicators: Aching Pain Intervention(s): Limited activity within patient's tolerance, Monitored during session     Extremity/Trunk Assessment Upper Extremity Assessment Upper Extremity Assessment: Right hand dominant;RUE deficits/detail;LUE deficits/detail RUE Deficits / Details: Pt reports bil rotator cuff tears. Able to lift RUE functionally to ~160 degrees shoulder flexion. LUE Deficits / Details: Pt reports bilat rotator cuff tears. Pt more impaired on L with ongoing shoulder pain and able to flex shoulder to ~90 degrees with notable compensatory abduction as well.   Lower Extremity Assessment Lower Extremity Assessment: Defer to PT evaluation       Communication Communication Communication: Hearing impairment   Cognition Arousal: Alert Behavior During Therapy: WFL for tasks assessed/performed Overall Cognitive Status: Within Functional Limits for tasks assessed                                       General Comments  afib reading on monitor throughout with HR 80s-140    Exercises     Shoulder Instructions      Home Living Family/patient expects to be discharged to:: Private residence Living Arrangements: Children (daughter and son-in-law (both handicapped though)) Available Help at Discharge: Family (daughter and son-in-law both handicapped and limited in support they can provide pt) Type of Home: House Home Access: Stairs to enter;Ramped entrance Entrance Stairs-Number of Steps: 4 Entrance Stairs-Rails: Right Home Layout: One level     Bathroom Shower/Tub: Producer, television/film/video: Handicapped height     Home Equipment: Grab bars - tub/shower;Grab bars - toilet;Crutches;Rollator (4  wheels);Cane - single point;BSC/3in1;Other (comment) (adjustable bed)   Additional Comments: wears glasses all the time      Prior Functioning/Environment Prior Level of Function : Needs assist       Physical Assist : ADLs (physical);Mobility (physical) Mobility (physical): Bed mobility ADLs (physical): Dressing;IADLs Mobility Comments: Uses crutches vs rollator mod I; assist for legs back into bed ADLs Comments: Pt's daughter and son-in-law are both handicapped and pt does the cooking. They have someone come to clean the house 1x every other week. Bathes and dresses herself except granddaughter helps with shoes/socks. Granddaughter or sister-in-law drives her to appointments. Gets groceries delivered. [facility] also brings her food        OT Problem List: Decreased strength;Impaired balance (sitting and/or standing);Decreased range of motion;Decreased activity tolerance;Decreased knowledge of use of DME or AE      OT Treatment/Interventions: Self-care/ADL training;Therapeutic exercise;Manual therapy;Patient/family education;Balance training;Therapeutic activities    OT Goals(Current goals can be found in the care plan section) Acute Rehab OT Goals Patient Stated Goal: go home OT Goal Formulation: With patient Time For Goal Achievement: 12/16/23 Potential to Achieve Goals: Good ADL Goals Pt Will Perform Grooming: with modified independence;standing;sitting Pt Will Perform Lower Body Dressing: with supervision;with adaptive equipment;sit to/from stand Pt Will Transfer to Toilet: with modified independence;ambulating  OT Frequency: Min 1X/week  Co-evaluation   Reason for Co-Treatment: For patient/therapist safety;To address functional/ADL transfers;Other (comment) (pt initially reluctant to mobilize and did not believe pt would agree to 2 separate sessions today)          AM-PAC OT "6 Clicks" Daily Activity     Outcome Measure Help from another person eating meals?:  None Help from another person taking care of personal grooming?: A Little Help from another person toileting, which includes using toliet, bedpan, or urinal?: A Little Help from another person bathing (including washing, rinsing, drying)?: A Lot Help from another person to put on and taking off regular upper body clothing?: A Little Help from another person to put on and taking off regular lower body clothing?: A Lot 6 Click Score: 17   End of Session Equipment Utilized During Treatment: Gait belt;Other (comment) (crutches) Nurse Communication: Mobility status  Activity Tolerance: Patient tolerated treatment well Patient left: in bed;with call bell/phone within reach  OT Visit Diagnosis: Unsteadiness on feet (R26.81);Muscle weakness (generalized) (M62.81);Other abnormalities of gait and mobility (R26.89);Pain Pain - Right/Left: Left Pain - part of body: Hip                Time: 1420-1446 OT Time Calculation (min): 26 min Charges:  OT General Charges $OT Visit: 1 Visit OT Evaluation $OT Eval Moderate Complexity: 1 Mod  Tyler Deis, OTR/L [facility] Acute Rehabilitation Office: [phone]   Myrla Halsted 12/02/2023, 5:10 PM

## 2023-12-02 NOTE — Assessment & Plan Note (Signed)
BP in the 120-140/70-110s. On Coreg 3.125 mg BID, Hydralazine 50 TID, and Spironolactone 12.5 mg. - Continue home Coreg 3.125 mg BID to help with BP and HR control - Restart home meds as indicated by pressures

## 2023-12-02 NOTE — Progress Notes (Addendum)
Urinary hat placed in toilet at this time. Patient refused purewick, wanted to walk to bathroom with crutches. HR up in the 150s with moving. Patient informed provider ordered lab work to see what current potassium level is now. Patient assisted safely back to bed. Bed alarm on, call button in reach.

## 2023-12-02 NOTE — Assessment & Plan Note (Signed)
>>  ASSESSMENT AND PLAN FOR LONGSTANDING PERSISTENT ATRIAL FIBRILLATION (HCC) WRITTEN ON 12/02/2023  9:50 AM BY BROWN, CARINA M, MD  Patient presented with worsening dyspnea over the past two weeks. EKG demonstrated new onset atrial fibrillation likely contributing to her SOB. RVP negative at [facility] on 1/24. CXR negative today for acute pathology. - Admit to FMTS, attending Dr. Bevin Bucks - Med-Tele, Vital signs per floor - Regular diet  - PT/OT to treat - Cardiac monitoring - Heparin , transition to DOAC as able - AM CBC, BMP, Mag, TSH - RVP to rule out URI - CTPE to rule out PE - Echo - Ambulatory pulse ox in AM - Follow up with cardiology OP vs IP as indicated - Follows with [facility]

## 2023-12-02 NOTE — Progress Notes (Addendum)
Patient arrived to floor from ED. HR bouncing in the 120s-140s- afib on monitor.  Internal medicine resident paged. No prns or drip ordered. Denies flutter in chest.

## 2023-12-03 DIAGNOSIS — I5023 Acute on chronic systolic (congestive) heart failure: Secondary | ICD-10-CM | POA: Diagnosis not present

## 2023-12-03 DIAGNOSIS — I4891 Unspecified atrial fibrillation: Secondary | ICD-10-CM | POA: Diagnosis not present

## 2023-12-03 LAB — BASIC METABOLIC PANEL
Anion gap: 8 (ref 5–15)
BUN: 12 mg/dL (ref 8–23)
CO2: 28 mmol/L (ref 22–32)
Calcium: 9.1 mg/dL (ref 8.9–10.3)
Chloride: 101 mmol/L (ref 98–111)
Creatinine, Ser: 0.85 mg/dL (ref 0.44–1.00)
GFR, Estimated: >60 mL/min (ref >60)
Glucose, Bld: 88 mg/dL (ref 70–99)
Potassium: 4.2 mmol/L (ref 3.5–5.1)
Sodium: 137 mmol/L (ref 135–145)

## 2023-12-03 LAB — RESPIRATORY PANEL BY PCR

## 2023-12-03 LAB — CBC
HCT: 35.2 % — ABNORMAL LOW (ref 36.0–46.0)
Hemoglobin: 10.7 g/dL — ABNORMAL LOW (ref 12.0–15.0)
MCH: 27.6 pg (ref 26.0–34.0)
MCHC: 30.4 g/dL (ref 30.0–36.0)
MCV: 91 fL (ref 80.0–100.0)
Platelets: 253 10*3/uL (ref 150–400)
RBC: 3.87 MIL/uL (ref 3.87–5.11)
RDW: 15.8 % — ABNORMAL HIGH (ref 11.5–15.5)
WBC: 4.3 10*3/uL (ref 4.0–10.5)
nRBC: 0 % (ref 0.0–0.2)

## 2023-12-03 LAB — MAGNESIUM: Magnesium: 2.4 mg/dL (ref 1.7–2.4)

## 2023-12-03 LAB — FERRITIN: Ferritin: 149 ng/mL (ref 11–307)

## 2023-12-03 LAB — IRON AND TIBC
Iron: 42 ug/dL (ref 28–170)
Saturation Ratios: 15 % (ref 10.4–31.8)
TIBC: 290 ug/dL (ref 250–450)
UIBC: 248 ug/dL

## 2023-12-03 MED ORDER — FERROUS SULFATE 325 (65 FE) MG PO TABS
325.0000 mg | ORAL_TABLET | ORAL | Status: DC
Start: 2023-12-03 — End: 2023-12-08
  Administered 2023-12-03 – 2023-12-07 (×3): 325 mg via ORAL
  Filled 2023-12-03 (×4): qty 1

## 2023-12-03 NOTE — Assessment & Plan Note (Signed)
BP in the 120-140/70-110s. On Coreg BID, Hydralazine 50 TID, and Spironolactone 12.5 mg. -Coreg increased as noted.  Continue hydralazine spironolactone

## 2023-12-03 NOTE — Plan of Care (Signed)

## 2023-12-03 NOTE — Assessment & Plan Note (Signed)
Chronic, likely in the setting of dependent edema. DVT U/S negative for acute DVT. - continue to monitor

## 2023-12-03 NOTE — Progress Notes (Signed)
Rounding Note    Patient Name: Donna Herrera Date of Encounter: 12/03/2023  [facility] Cardiologist: Dietrich Pates, MD   Subjective   Feeling well without acute complaint.  Improved from yesterday.  Inpatient Medications    Scheduled Meds:  allopurinol  100 mg Oral Daily   apixaban  5 mg Oral BID   carvedilol  6.25 mg Oral BID   cholecalciferol  5,000 Units Oral Daily   DULoxetine  30 mg Oral Daily   ezetimibe  10 mg Oral Daily   ferrous sulfate  325 mg Oral QODAY   gabapentin  300 mg Oral QHS   hydrALAZINE  50 mg Oral TID   lidocaine  1 patch Transdermal Q24H   spironolactone  12.5 mg Oral Daily   Continuous Infusions:  PRN Meds: acetaminophen, albuterol, famotidine, oxyCODONE   Vital Signs    Vitals:   12/02/23 2347 12/03/23 0427 12/03/23 0754 12/03/23 0857  BP: 101/65 117/71 (!) 115/98 (!) 115/98  Pulse:   62 97  Resp: 18 18 18    Temp: 98.2 F (36.8 C) 98.2 F (36.8 C) 98 F (36.7 C)   TempSrc: Oral Oral Oral   SpO2: 97% 92% 94%   Weight:  73.4 kg    Height:        Intake/Output Summary (Last 24 hours) at 12/03/2023 1059 Last data filed at 12/03/2023 0900 Gross per 24 hour  Intake 400.04 ml  Output 2500 ml  Net -2099.96 ml      12/03/2023    4:27 AM 12/02/2023    3:58 PM 12/01/2023    3:12 PM  Last 3 Weights  Weight (lbs) 161 lb 13.1 oz 167 lb 1.7 oz 169 lb 12.1 oz  Weight (kg) 73.4 kg 75.8 kg 77 kg      Telemetry    Atrial fibrillation- Personally Reviewed  ECG    None new- Personally Reviewed  Physical Exam   GEN: No acute distress.   Neck: No JVD Cardiac: Irregular, no murmurs, rubs, or gallops.  Respiratory: Clear to auscultation bilaterally. GI: Soft, nontender, non-distended  MS: No edema; No deformity. Neuro:  Nonfocal  Psych: Normal affect   Labs    High Sensitivity Troponin:   Recent Labs  Lab 12/01/23 1529 12/01/23 1729  TROPONINIHS 16 17     Chemistry Recent Labs  Lab 12/01/23 1529  12/02/23 0446 12/02/23 1814 12/03/23 0250  NA  --  138 135 137  K  --  3.3* 4.8 4.2  CL  --  107 100 101  CO2  --  18* 25 28  GLUCOSE  --  104* 120* 88  BUN  --  14 13 12   CREATININE  --  0.78 0.86 0.85  CALCIUM  --  7.9* 9.5 9.1  MG 2.0 1.8  --  2.4  GFRNONAA  --  >60 >60 >60  ANIONGAP  --  13 10 8     Lipids No results for input(s): "CHOL", "TRIG", "HDL", "LABVLDL", "LDLCALC", "CHOLHDL" in the last 168 hours.  Hematology Recent Labs  Lab 12/01/23 1514 12/02/23 0446 12/03/23 0250  WBC 4.4 4.9 4.3  RBC 3.79* 3.48* 3.87  HGB 10.5* 9.7* 10.7*  HCT 34.4* 32.9* 35.2*  MCV 90.8 94.5 91.0  MCH 27.7 27.9 27.6  MCHC 30.5 29.5* 30.4  RDW 15.7* 15.9* 15.8*  PLT 265 234 253   Thyroid  Recent Labs  Lab 12/02/23 0446  TSH 1.395    BNP Recent Labs  Lab 12/01/23 1515  BNP 495.9*    DDimer No results for input(s): "DDIMER" in the last 168 hours.   Radiology    VAS [name] LOWER EXTREMITY VENOUS (DVT) Result Date: 12/02/2023  Lower Venous DVT Study Patient Name:  TRUDEE CHIRINO  Date of Exam:   12/02/2023 Medical Rec #: [id number]                Accession #:    [id number] Date of Birth: 30-Jun-1946               Patient Gender: F Patient Age:   31 years Exam Location:  [facility] Procedure:      VAS [name] LOWER EXTREMITY VENOUS (DVT) Referring Phys: CARINA BROWN --------------------------------------------------------------------------------  Indications: Swelling.  Risk Factors: Atrial fibrillation, CHF. Limitations: Body habitus, poor ultrasound/tissue interface, patient unable to position legs, edema and inability to tolerate compression maneuvers. Comparison Study: No prior study Performing Technologist: Sherren Kerns RVS  Examination Guidelines: A complete evaluation includes B-mode imaging, spectral Doppler, color Doppler, and power Doppler as needed of all accessible portions of each vessel. Bilateral testing is considered an integral part of a complete examination.  Limited examinations for reoccurring indications may be performed as noted. The reflux portion of the exam is performed with the patient in reverse Trendelenburg.  +-------+---------------+---------+-----------+----------------+---------------+ RIGHT  CompressibilityPhasicitySpontaneityProperties      Thrombus Aging  +-------+---------------+---------+-----------+----------------+---------------+ CFV    Full           Yes      No         pulsatile                                                                 waveforms                       +-------+---------------+---------+-----------+----------------+---------------+ SFJ    Full                                                               +-------+---------------+---------+-----------+----------------+---------------+ FV Prox               Yes      No         pulsatile                                                                 waveforms                       +-------+---------------+---------+-----------+----------------+---------------+ FV Mid                Yes      No         pulsatile  waveforms                       +-------+---------------+---------+-----------+----------------+---------------+ POP                   Yes      No         pulsatile                                                                 waveforms                       +-------+---------------+---------+-----------+----------------+---------------+ PTV                                                       Not well                                                                  visualized      +-------+---------------+---------+-----------+----------------+---------------+ PERO                                                      Not well                                                                  visualized       +-------+---------------+---------+-----------+----------------+---------------+   +---------+---------------+---------+-----------+---------------+--------------+ LEFT     CompressibilityPhasicitySpontaneityProperties     Thrombus Aging +---------+---------------+---------+-----------+---------------+--------------+ CFV      Full           Yes                 pulsatile                                                                 waveforms                     +---------+---------------+---------+-----------+---------------+--------------+ SFJ      Full                                                             +---------+---------------+---------+-----------+---------------+--------------+  FV Prox                 Yes      No         pulsatile                                                                 waveforms                     +---------+---------------+---------+-----------+---------------+--------------+ FV Mid                  Yes      No         pulsatile                                                                 waveforms                     +---------+---------------+---------+-----------+---------------+--------------+ FV Distal                                                  Not well                                                                  visualized     +---------+---------------+---------+-----------+---------------+--------------+ PFV                                                        Not well                                                                  visualized     +---------+---------------+---------+-----------+---------------+--------------+ POP                                                        patent by  color          +---------+---------------+---------+-----------+---------------+--------------+  PTV                                                        Not well                                                                  visualized     +---------+---------------+---------+-----------+---------------+--------------+ PERO                                                       Not well                                                                  visualized     +---------+---------------+---------+-----------+---------------+--------------+     Summary: RIGHT: - There is no evidence of deep vein thrombosis in the lower extremity. However, portions of this examination were limited- see technologist comments above.  pulsatile waveforms  LEFT: - There is no evidence of deep vein thrombosis in the lower extremity. However, portions of this examination were limited- see technologist comments above.  Pulsatile waveforms.  *See table(s) above for measurements and observations. Electronically signed by Lemar Livings MD on 12/02/2023 at 4:05:54 PM.    Final    ECHOCARDIOGRAM COMPLETE Result Date: 12/02/2023    ECHOCARDIOGRAM REPORT   Patient Name:   CHERIS TWETEN Date of Exam: 12/02/2023 Medical Rec #:  [id number]               Height:       65.0 in Accession #:    [id number]              Weight:       169.8 lb Date of Birth:  03-18-1946              BSA:          1.845 m Patient Age:    46 years                BP:           114/68 mmHg Patient Gender: F                       HR:           61 bpm. Exam Location:  Inpatient Procedure: 2D Echo, Cardiac Doppler, Color Doppler and Intracardiac            Opacification Agent Indications:    Dyspnea R06.00  History:        Patient has prior history of Echocardiogram examinations, most  recent 02/14/2023. CHF and Cardiomyopathy; Risk                 Factors:Hypertension.  Sonographer:    Darlys Gales Referring Phys: [phone] NATHAN PICKERING IMPRESSIONS  1. No evidence of LV thrombus. Left ventricular ejection fraction, by  estimation, is 20 to 25%. The left ventricle has severely decreased function. The left ventricle demonstrates global hypokinesis. The left ventricular internal cavity size was moderately dilated. Left ventricular diastolic parameters are indeterminate. Elevated left ventricular end-diastolic pressure.  2. Right ventricular systolic function is severely reduced. The right ventricular size is normal. There is mildly elevated pulmonary artery systolic pressure. The estimated right ventricular systolic pressure is 40.0 mmHg.  3. Left atrial size was moderately dilated.  4. Right atrial size was moderately dilated.  5. The mitral valve is normal in structure. Trivial mitral valve regurgitation. No evidence of mitral stenosis.  6. The tricuspid valve is abnormal. Tricuspid valve regurgitation is moderate.  7. The aortic valve was not well visualized. Aortic valve regurgitation is not visualized. No aortic stenosis is present.  8. Pulmonic valve regurgitation not assessed.  9. The inferior vena cava is normal in size with greater than 50% respiratory variability, suggesting right atrial pressure of 3 mmHg. Comparison(s): Changes from prior study are noted. LVEF worsened from 50-55% to 20-25% now with severe RV dysfunction. FINDINGS  Left Ventricle: No evidence of LV thrombus. Left ventricular ejection fraction, by estimation, is 20 to 25%. The left ventricle has severely decreased function. The left ventricle demonstrates global hypokinesis. The left ventricular internal cavity size was moderately dilated. There is no left ventricular hypertrophy. Left ventricular diastolic parameters are indeterminate. Elevated left ventricular end-diastolic pressure. Right Ventricle: The right ventricular size is normal. No increase in right ventricular wall thickness. Right ventricular systolic function is severely reduced. There is mildly elevated pulmonary artery systolic pressure. The tricuspid regurgitant velocity is 3.04 m/s, and  with an assumed right atrial pressure of 3 mmHg, the estimated right ventricular systolic pressure is 40.0 mmHg. Left Atrium: Left atrial size was moderately dilated. Right Atrium: Right atrial size was moderately dilated. Pericardium: There is no evidence of pericardial effusion. Mitral Valve: The mitral valve is normal in structure. Trivial mitral valve regurgitation. No evidence of mitral valve stenosis. Tricuspid Valve: The tricuspid valve is abnormal. Tricuspid valve regurgitation is moderate . No evidence of tricuspid stenosis. Aortic Valve: The aortic valve was not well visualized. Aortic valve regurgitation is not visualized. No aortic stenosis is present. Aortic valve mean gradient measures 2.0 mmHg. Aortic valve peak gradient measures 3.0 mmHg. Aortic valve area, by VTI measures 2.29 cm. Pulmonic Valve: The pulmonic valve was not assessed. Pulmonic valve regurgitation not assessed. Aorta: The aortic root is normal in size and structure. Venous: The inferior vena cava is normal in size with greater than 50% respiratory variability, suggesting right atrial pressure of 3 mmHg. IAS/Shunts: The interatrial septum was not well visualized.  LEFT VENTRICLE PLAX 2D LVIDd:         4.60 cm   Diastology LVIDs:         4.30 cm   LV e' medial:    5.87 cm/s LV PW:         0.90 cm   LV E/e' medial:  16.8 LV IVS:        1.00 cm   LV e' lateral:   7.40 cm/s LVOT diam:     1.80 cm   LV E/e' lateral: 13.3 LV SV:  34 LV SV Index:   19 LVOT Area:     2.54 cm  RIGHT VENTRICLE RV S prime:     9.46 cm/s LEFT ATRIUM             Index        RIGHT ATRIUM           Index LA Vol (A2C):   42.3 ml 22.93 ml/m  RA Area:     21.20 cm LA Vol (A4C):   30.5 ml 16.53 ml/m  RA Volume:   60.80 ml  32.95 ml/m LA Biplane Vol: 38.7 ml 20.98 ml/m  AORTIC VALVE AV Area (Vmax):    1.81 cm AV Area (Vmean):   1.66 cm AV Area (VTI):     2.29 cm AV Vmax:           86.50 cm/s AV Vmean:          67.000 cm/s AV VTI:            0.150 m AV  Peak Grad:      3.0 mmHg AV Mean Grad:      2.0 mmHg LVOT Vmax:         61.50 cm/s LVOT Vmean:        43.700 cm/s LVOT VTI:          0.135 m LVOT/AV VTI ratio: 0.90  AORTA Ao Root diam: 2.90 cm MITRAL VALVE               TRICUSPID VALVE MV Area (PHT): 3.99 cm    TR Peak grad:   37.0 mmHg MV Decel Time: 190 msec    TR Vmax:        304.00 cm/s MV E velocity: 98.50 cm/s MV A velocity: 25.20 cm/s  SHUNTS MV E/A ratio:  3.91        Systemic VTI:  0.14 m                            Systemic Diam: 1.80 cm Vishnu Priya Mallipeddi Electronically signed by Winfield Rast Mallipeddi Signature Date/Time: 12/02/2023/10:49:36 AM    Final    CT Angio Chest Pulmonary Embolism (PE) W or WO Contrast Result Date: 12/02/2023 CLINICAL DATA:  Shortness of breath and dizziness.  PE suspected EXAM: CT ANGIOGRAPHY CHEST WITH CONTRAST TECHNIQUE: Multidetector CT imaging of the chest was performed using the standard protocol during bolus administration of intravenous contrast. Multiplanar CT image reconstructions and MIPs were obtained to evaluate the vascular anatomy. RADIATION DOSE REDUCTION: This exam was performed according to the departmental dose-optimization program which includes automated exposure control, adjustment of the mA and/or kV according to patient size and/or use of iterative reconstruction technique. CONTRAST:  75mL OMNIPAQUE IOHEXOL 350 MG/ML SOLN COMPARISON:  Same day chest radiograph and CT 05/08/2023 FINDINGS: Cardiovascular: Cardiomegaly. No pericardial effusion. Negative for acute pulmonary embolism. Normal caliber thoracic aorta. Aortic atherosclerotic calcification. Mediastinum/Nodes: Bowing of the posterior trachea compatible with expiratory phase imaging. Esophagus is unremarkable. No thoracic adenopathy. Lungs/Pleura: Patchy ground-glass opacities in both lungs. Scattered areas scarring and bronchiolectasis, similar to prior. No pleural effusion or pneumothorax. Upper Abdomen: No acute abnormality.  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 No acute fracture. Elevated right hemidiaphragm. Review of the MIP images confirms the above findings. IMPRESSION: 1. Negative for acute pulmonary embolism. 2. Patchy ground-glass opacities in both lungs. I favor this to represent air trapping on expiratory phase imaging. Atypical infection  or edema could appear similarly. 3. Cardiomegaly. 4.  Aortic Atherosclerosis (ICD10-I70.0). 5. 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 Electronically Signed   By: Minerva Fester M.D.   On: 12/02/2023 02:41   DG HIP UNILAT WITH PELVIS 2-3 VIEWS RIGHT Result Date: 12/02/2023 CLINICAL DATA:  Right hip pain EXAM: DG HIP (WITH OR WITHOUT PELVIS) 2-3V RIGHT COMPARISON:  11/02/2012 FINDINGS: Postoperative changes with right total hip arthroplasty. Superior subluxation of the femoral head component within the acetabular component, unchanged. Mild acetabula protrusio also unchanged. Bone resorption around the proximal femoral shaft and inter trochanteric region appears unchanged. Cerclage wires are present, some fractured. No change. No acute bony abnormalities are identified. Left hip demonstrates complete fusion. Increased trabecular appearance and mild expansile change in the left hip may indicate Paget's disease or postoperative change. 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 Appearances are unchanged since prior study. Old fusion of the left hip. No acute bony abnormalities. Electronically Signed   By: Burman Nieves M.D.   On: 12/02/2023 00:58   DG Chest 2 View Result Date: 12/01/2023 CLINICAL DATA:  Shortness of breath.  Dizziness. EXAM: CHEST - 2 VIEW COMPARISON:  11/24/2023 FINDINGS: Lateral view degraded by patient arm position. Moderate to marked right hemidiaphragm elevation again identified. Midline trachea.  Moderate cardiomegaly. No pleural effusion or pneumothorax. No congestive failure. Left base scarring. IMPRESSION: Cardiomegaly without congestive failure. Electronically Signed   By: Jeronimo Greaves M.D.   On: 12/01/2023 17:27    Cardiac Studies   TTE   1. No evidence of LV thrombus. Left ventricular ejection fraction, by  estimation, is 20 to 25%. The left ventricle has severely decreased  function. The left ventricle demonstrates global hypokinesis. The left  ventricular internal cavity size was  moderately dilated. Left ventricular diastolic parameters are  indeterminate. Elevated left ventricular end-diastolic pressure.   2. Right ventricular systolic function is severely reduced. The right  ventricular size is normal. There is mildly elevated pulmonary artery  systolic pressure. The estimated right ventricular systolic pressure is  40.0 mmHg.   3. Left atrial size was moderately dilated.   4. Right atrial size was moderately dilated.   5. The mitral valve is normal in structure. Trivial mitral valve  regurgitation. No evidence of mitral stenosis.   6. The tricuspid valve is abnormal. Tricuspid valve regurgitation is  moderate.   7. The aortic valve was not well visualized. Aortic valve regurgitation  is not visualized. No aortic stenosis is present.   8. Pulmonic valve regurgitation not assessed.   9. The inferior vena cava is normal in size with greater than 50%  respiratory variability, suggesting right atrial pressure of 3 mmHg.   Patient Profile     78 y.o. female presented to the hospital with shortness of breath, found to be in atrial fibrillation with acute systolic heart failure  Assessment & Plan    1.  Persistent atrial fibrillation: Has been short of breath for 2 weeks.  Ejection fraction is reduced.  Currently on Eliquis.  Rate control with carvedilol.  With severely reduced ejection fraction, she would benefit from TEE and cardioversion prior to discharge.  I have  discussed this with her family and they Amaiyah Nordhoff let us know if they want a proceed later today.  2.  Acute systolic heart failure: Ejection fraction severely reduced.  Right atrial pressure not elevated but elevated BNP.  Net -2100 cc.  Volume status stable     For questions or updates, please contact [facility] Please consult [email or web address] for contact info under        Signed, Grason Brailsford Jorja Loa, MD  12/03/2023, 10:59 AM

## 2023-12-03 NOTE — Assessment & Plan Note (Addendum)
New onset over the past 6 months, no sources of bleeding. Hgb stable.  Last colonoscopy in 2021, no further recommended screening per GI. Iron studies with slightly low sat ratio. -start PO iron every other day -Monitor CBC

## 2023-12-03 NOTE — Assessment & Plan Note (Addendum)
Echo showed severely reduced EF 20 to 25%.  Possible tachycardia mediated cardiomyopathy per EP.  Given 1 dose of 40 mg IV Lasix 2/1; euvolemic on exam today.  Will need Entresto prior to discharge; likely taper off hydralazine after starting this.  CTA PE negative for PE. - Daily weights and strict I/O -Monitor volume status, diurese as appropriate - Continue home spironolactone, Coreg BID

## 2023-12-03 NOTE — Progress Notes (Signed)
Daily Progress Note Intern Pager: [phone]  Patient name: Donna Herrera Medical record number: [id number] Date of birth: Jul 17, 1946 Age: 78 y.o. Gender: female  Primary Care Provider: Penne Lash, MD Consultants: Electrophysiology Code Status: Full code  Pt Overview and Major Events to Date:  1/31 admitted  Assessment and Plan:  Donna Herrera is a 78 y.o. female presenting with new onset A-fib.  Transitioned to DOAC, will need TEE/cardioversion prior to discharge.  EP following.  Pertinent PMH/PSH includes aortic atherosclerosis, high fall risk, possible COPD, cervical DDD, DJD, GERD, CHF, NICM, ambulates with crutches, vit D deficiency, prediabetes, OA, pulmonary fibrosis, history of hip replacement .  Assessment & Plan New onset atrial fibrillation (HCC) Remains in Afib, mostly rate controlled with episodes of RVR.  Transitioned from heparin to Eliquis.  Also on Coreg 6.25 mg twice daily. -Appreciate EP recs -Plan for TEE/cardioversion prior to discharge -Continue Eliquis -Continue Coreg - Replete electrolyte goals: K>4 and Mag>2 (HFpEF) heart failure with preserved ejection fraction (HCC) Echo showed severely reduced EF 20 to 25%.  Possible tachycardia mediated cardiomyopathy per EP.  Given 1 dose of 40 mg IV Lasix 2/1; euvolemic on exam today.  Will need Entresto prior to discharge; likely taper off hydralazine after starting this.  CTA PE negative for PE. - Daily weights and strict I/O -Monitor volume status, diurese as appropriate - Continue home spironolactone, Coreg BID Bilateral leg pain Chronic, likely in the setting of dependent edema. DVT U/S negative for acute DVT. - continue to monitor Normocytic anemia New onset over the past 6 months, no sources of bleeding. Hgb stable.  Last colonoscopy in 2021, no further recommended screening per GI. Iron studies with slightly low sat ratio. -start PO iron every other day -Monitor CBC Essential  hypertension BP in the 120-140/70-110s. On Coreg BID, Hydralazine 50 TID, and Spironolactone 12.5 mg. -Coreg increased as noted.  Continue hydralazine spironolactone  Right hip pain S/p R total hip replacement. Has a hx of SCFE and DJD. Complains of R hip pain to light palpation. "Old fusion of L hip, R hip arthroplasty with acetabular protrusion & superior subluxation & bone resorption around the proximal femur." Follows orthopedics outpatient.  - R hip XR with no acute changes - Pain regimen: Lidocaine patch q24h. Tylenol for mild pain and Oxycodone 2.5-5 mg for moderate/severe pain, K pad as tolerated, consider Voltaren gel  - PT/OT - OOB as tolerated   Chronic and Stable Issues: COPD: Albuterol prn  Gout: Continue home allopurinol. Chronic Right Hip Pain: Takes Oxycodone 5 mg q6h PRN at home. Added on Oxycodone 2.5-5mg  q6h PRN for moderate/severe pain and Lidocaine patch q24h. Anxiety/Depression: Continue Cymbalta 30 mg daily Vitamin D deficiency: Contine D3 5000u daily HLD: Continue Zetia 10 mg daily.  Lipid panel repeated 4 weeks ago demonstrated LDL 138. GERD: Continue Famotidine 20 mg daily PRN Neuropathy: Continue Gabapentin 300 mg nightly Prediabetes: A1c 4 weeks ago 6.0.  HTN: Mildly hypertensive, on Coreg, hydralazine and spironolactone  FEN/GI: Heart healthy diet, n.p.o. at midnight 2/3 for possible TEE/cardioversion PPx: Eliquis Dispo:Home pending continued workup and management  Subjective:  NAEON, denies concerns this morning.  Denies dyspnea, leg swelling.  Objective: Temp:  [97.6 F (36.4 C)-98.5 F (36.9 C)] 98 F (36.7 C) (02/02 0754) Pulse Rate:  [62-140] 62 (02/02 0754) Resp:  [13-22] 18 (02/02 0754) BP: (101-145)/(65-107) 115/98 (02/02 0754) SpO2:  [92 %-100 %] 94 % (02/02 0754) Weight:  [73.4 kg-75.8 kg] 73.4 kg (02/02 0427) Physical Exam:  General: NAD Cardiovascular: Irregularly irregular rhythm, normal rate, no murmurs Respiratory: CTAB normal WOB  on RA Abdomen: Soft NTND Extremities: No significant edema  Laboratory: Most recent CBC Lab Results  Component Value Date   WBC 4.3 12/03/2023   HGB 10.7 (L) 12/03/2023   HCT 35.2 (L) 12/03/2023   MCV 91.0 12/03/2023   PLT 253 12/03/2023   Most recent BMP    Latest Ref Rng & Units 12/03/2023    2:50 AM  BMP  Glucose 70 - 99 mg/dL 88   BUN 8 - 23 mg/dL 12   Creatinine 1.61 - 1.00 mg/dL 0.96   Sodium 045 - 409 mmol/L 137   Potassium 3.5 - 5.1 mmol/L 4.2   Chloride 98 - 111 mmol/L 101   CO2 22 - 32 mmol/L 28   Calcium 8.9 - 10.3 mg/dL 9.1     Iron 42, TIBC 811, sat ratio 15, UIBC 248, ferritin 149  Imaging/Diagnostic Tests:  DG hip right IMPRESSION: Previous right hip arthroplasty with acetabular protrusio and superior subluxation as well as bone resorption around the proximal femur. Appearances are unchanged since prior study. Old fusion of the left hip. No acute bony abnormalities.     Electronically Signed   By: Burman Nieves M.D.   On: 12/02/2023 00:58  CTA PE  IMPRESSION: 1. Negative for acute pulmonary embolism. 2. Patchy ground-glass opacities in both lungs. I favor this to represent air trapping on expiratory phase imaging. Atypical infection or edema could appear similarly. 3. Cardiomegaly. 4.  Aortic Atherosclerosis (ICD10-I70.0). 5. 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     Electronically Signed   By: Minerva Fester M.D.   On: 12/02/2023 02:41    Vonna Drafts, MD 12/03/2023, 8:08 AM  PGY-2, [facility] FPTS Intern pager: [phone], text pages welcome Secure chat group [facility] Teaching Service

## 2023-12-03 NOTE — Assessment & Plan Note (Signed)
Remains in Afib, mostly rate controlled with episodes of RVR.  Transitioned from heparin to Eliquis.  Also on Coreg 6.25 mg twice daily. -Appreciate EP recs -Plan for TEE/cardioversion prior to discharge -Continue Eliquis -Continue Coreg - Replete electrolyte goals: K>4 and Mag>2

## 2023-12-03 NOTE — Assessment & Plan Note (Signed)
S/p R total hip replacement. Has a hx of SCFE and DJD. Complains of R hip pain to light palpation. "Old fusion of L hip, R hip arthroplasty with acetabular protrusion & superior subluxation & bone resorption around the proximal femur." Follows orthopedics outpatient.  - R hip XR with no acute changes - Pain regimen: Lidocaine patch q24h. Tylenol for mild pain and Oxycodone 2.5-5 mg for moderate/severe pain, K pad as tolerated, consider Voltaren gel  - PT/OT - OOB as tolerated

## 2023-12-03 NOTE — Assessment & Plan Note (Signed)
>>  ASSESSMENT AND PLAN FOR LONGSTANDING PERSISTENT ATRIAL FIBRILLATION (HCC) WRITTEN ON 12/03/2023  8:27 AM BY MAHMOOD, ATIF, MD  Remains in Afib, mostly rate controlled with episodes of RVR.  Transitioned from heparin  to Eliquis .  Also on Coreg  6.25 mg twice daily. -Appreciate EP recs -Plan for TEE/cardioversion prior to discharge -Continue Eliquis  -Continue Coreg  - Replete electrolyte goals: K>4 and Mag>2

## 2023-12-04 ENCOUNTER — Inpatient Hospital Stay (HOSPITAL_COMMUNITY): Payer: Medicare Other

## 2023-12-04 ENCOUNTER — Telehealth (HOSPITAL_COMMUNITY): Payer: Self-pay

## 2023-12-04 ENCOUNTER — Telehealth (HOSPITAL_COMMUNITY): Payer: Self-pay | Admitting: Pharmacy Technician

## 2023-12-04 ENCOUNTER — Encounter (HOSPITAL_COMMUNITY)
Admission: EM | Disposition: A | Discharge status: Home Health | Payer: Self-pay | Source: Home / Self Care | Attending: Family Medicine

## 2023-12-04 ENCOUNTER — Inpatient Hospital Stay (HOSPITAL_COMMUNITY): Payer: Medicare Other | Admitting: Anesthesiology

## 2023-12-04 ENCOUNTER — Encounter (HOSPITAL_COMMUNITY): Payer: Self-pay | Admitting: Student

## 2023-12-04 ENCOUNTER — Other Ambulatory Visit (HOSPITAL_COMMUNITY): Payer: Self-pay

## 2023-12-04 DIAGNOSIS — I361 Nonrheumatic tricuspid (valve) insufficiency: Secondary | ICD-10-CM

## 2023-12-04 DIAGNOSIS — I110 Hypertensive heart disease with heart failure: Secondary | ICD-10-CM

## 2023-12-04 DIAGNOSIS — J449 Chronic obstructive pulmonary disease, unspecified: Secondary | ICD-10-CM

## 2023-12-04 DIAGNOSIS — I340 Nonrheumatic mitral (valve) insufficiency: Secondary | ICD-10-CM | POA: Diagnosis not present

## 2023-12-04 DIAGNOSIS — N179 Acute kidney failure, unspecified: Secondary | ICD-10-CM | POA: Insufficient documentation

## 2023-12-04 DIAGNOSIS — I509 Heart failure, unspecified: Secondary | ICD-10-CM

## 2023-12-04 DIAGNOSIS — I4891 Unspecified atrial fibrillation: Secondary | ICD-10-CM | POA: Diagnosis not present

## 2023-12-04 DIAGNOSIS — I4819 Other persistent atrial fibrillation: Secondary | ICD-10-CM

## 2023-12-04 DIAGNOSIS — I5023 Acute on chronic systolic (congestive) heart failure: Secondary | ICD-10-CM | POA: Diagnosis not present

## 2023-12-04 DIAGNOSIS — I5041 Acute combined systolic (congestive) and diastolic (congestive) heart failure: Secondary | ICD-10-CM

## 2023-12-04 HISTORY — PX: CARDIOVERSION: EP1203

## 2023-12-04 HISTORY — PX: TRANSESOPHAGEAL ECHOCARDIOGRAM (CATH LAB): EP1270

## 2023-12-04 LAB — BASIC METABOLIC PANEL
Anion gap: 9 (ref 5–15)
BUN: 15 mg/dL (ref 8–23)
CO2: 25 mmol/L (ref 22–32)
Calcium: 8.7 mg/dL — ABNORMAL LOW (ref 8.9–10.3)
Chloride: 101 mmol/L (ref 98–111)
Creatinine, Ser: 1.1 mg/dL — ABNORMAL HIGH (ref 0.44–1.00)
GFR, Estimated: 51 mL/min — ABNORMAL LOW (ref >60)
Glucose, Bld: 95 mg/dL (ref 70–99)
Potassium: 4.4 mmol/L (ref 3.5–5.1)
Sodium: 135 mmol/L (ref 135–145)

## 2023-12-04 LAB — CBC
HCT: 33.1 % — ABNORMAL LOW (ref 36.0–46.0)
Hemoglobin: 10.3 g/dL — ABNORMAL LOW (ref 12.0–15.0)
MCH: 27.6 pg (ref 26.0–34.0)
MCHC: 31.1 g/dL (ref 30.0–36.0)
MCV: 88.7 fL (ref 80.0–100.0)
Platelets: 256 10*3/uL (ref 150–400)
RBC: 3.73 MIL/uL — ABNORMAL LOW (ref 3.87–5.11)
RDW: 15.6 % — ABNORMAL HIGH (ref 11.5–15.5)
WBC: 5.7 10*3/uL (ref 4.0–10.5)
nRBC: 0 % (ref 0.0–0.2)

## 2023-12-04 LAB — ECHO TEE: Est EF: <20

## 2023-12-04 SURGERY — TRANSESOPHAGEAL ECHOCARDIOGRAM (TEE) (CATHLAB)
Anesthesia: General

## 2023-12-04 MED ORDER — PROPOFOL 500 MG/50ML IV EMUL
INTRAVENOUS | Status: DC | PRN
  Administered 2023-12-04: 75 ug/kg/min via INTRAVENOUS

## 2023-12-04 MED ORDER — METOPROLOL TARTRATE 5 MG/5ML IV SOLN
2.5000 mg | INTRAVENOUS | Status: DC | PRN
  Administered 2023-12-05: 2.5 mg via INTRAVENOUS
  Filled 2023-12-04 (×2): qty 5

## 2023-12-04 MED ORDER — PHENYLEPHRINE 80 MCG/ML (10ML) SYRINGE FOR IV PUSH (FOR BLOOD PRESSURE SUPPORT)
PREFILLED_SYRINGE | INTRAVENOUS | Status: DC | PRN
  Administered 2023-12-04 (×3): 80 ug via INTRAVENOUS

## 2023-12-04 MED ORDER — PROPOFOL 10 MG/ML IV BOLUS
INTRAVENOUS | Status: DC | PRN
  Administered 2023-12-04: 30 mg via INTRAVENOUS
  Administered 2023-12-04: 20 mg via INTRAVENOUS
  Administered 2023-12-04: 10 mg via INTRAVENOUS

## 2023-12-04 MED ORDER — PHENOL 1.4 % MT LIQD
2.0000 | OROMUCOSAL | Status: DC | PRN
  Administered 2023-12-04: 2 via OROMUCOSAL
  Filled 2023-12-04: qty 177

## 2023-12-04 MED ORDER — SACUBITRIL-VALSARTAN 24-26 MG PO TABS
1.0000 | ORAL_TABLET | Freq: Two times a day (BID) | ORAL | Status: DC
  Administered 2023-12-04: 1 via ORAL
  Filled 2023-12-04 (×2): qty 1

## 2023-12-04 MED ORDER — SODIUM CHLORIDE 0.9 % IV SOLN
INTRAVENOUS | Status: DC
Start: 2023-12-04 — End: 2023-12-04

## 2023-12-04 SURGICAL SUPPLY — 1 items: PAD DEFIB RADIO PHYSIO CONN (PAD) ×1 IMPLANT

## 2023-12-04 NOTE — Assessment & Plan Note (Addendum)
S/p R total hip replacement. Has a hx of SCFE and DJD. Complains of R hip pain to light palpation. Imaging revealed old fusion of L hip, R hip arthroplasty with acetabular protrusion & superior subluxation & bone resorption around the proximal femur. Follows orthopedics outpatient. R hip XR with no acute changes. - Pain regimen: Lidocaine patch q24h. Tylenol for mild pain and Oxycodone 2.5-5 mg for moderate/severe pain, K pad as tolerated, consider Voltaren gel  - PT/OT - OOB as tolerated

## 2023-12-04 NOTE — Assessment & Plan Note (Signed)
>>  ASSESSMENT AND PLAN FOR LONGSTANDING PERSISTENT ATRIAL FIBRILLATION (HCC) WRITTEN ON 12/04/2023  1:54 PM BY GOMES, ADRIANA, DO  Remains in Afib, mostly rate controlled with episodes of RVR.  Transitioned from heparin  to Eliquis .  Continue Coreg  6.25 mg twice daily. TEE/cardioversion today and she was successfully converted to NSR. TEE demonstrated EF <20%, LV with severely decreased function, LV global hypokinesis, RA severely dilated, moderate MVR, mild-mod TVR, trivial AVR, and mild G2 layered plaque involving the descending aorta. - EP consulted, appreciate recommendations - Continue Eliquis  - Continue Coreg  - Replete electrolyte goals: K>4 and Mag>2

## 2023-12-04 NOTE — Consult Note (Signed)
 ELECTROPHYSIOLOGY CONSULT NOTE    Patient ID: Vianey Caniglia MRN: [id number], DOB/AGE: 1945-12-04 78 y.o.  Admit date: 12/01/2023 Date of Consult: 12/05/2023  Primary Physician: Lonnie Earnest, MD Primary Cardiologist: Vina Gull, MD  Electrophysiologist: New   Referring Provider: Dr. Loni   Patient Profile: Arma Reining is a 78 y.o. female with a history of HTN and pulmonary fibrosis who is being seen today for the evaluation of atrial fibrillation at the request of Dr. Loni.  HPI:  Alaine Loughney is a 78 y.o. female with medical history as above who presented to the hospital with 2 weeks of worsening SOB and edema. Found to be in new AF ranging from 80-120s.  Echo showed biventricular CHF. Started on IV heparin . Underwent TEE DCC 2/3 but continued to have paroxysms of AF with RVR. EP asked to see for consideration of AAD given pulmonary fibrosis history.   Currently, she is asymptomatic at rest. Had mild palpitations when out of rhythm this am. Repeats history as above.  No longer smoking. Has not needed oxygen, and has not seen Pulmonary. Has findings consistent with pulmonary fibrosis on normal CT, but has not had official diagnosis.  Denies chest pain, syncope, or edema currently.   Labs Potassium4.5 (02/04 9692) Magnesium   2.2 (02/04 0307) Creatinine, ser  0.77 (02/04 0307) PLT  256 (02/03 0254) HGB  10.3* (02/03 0254) WBC 5.7 (02/03 0254)  .    Allergies, Medical, Surgical, Social, and Family Histories have been reviewed and are referenced here-in when relevant for medical decision making.    Physical Exam: Vitals:   12/04/23 2354 12/05/23 0325 12/05/23 0605 12/05/23 0715  BP: 118/77 126/67 102/67 98/63  Pulse: 60 63 (!) 132 (!) 136  Resp: 18 17 16 17   Temp: 98.9 F (37.2 C) 98.7 F (37.1 C) 98.4 F (36.9 C) 98.7 F (37.1 C)  TempSrc: Oral Oral Oral Oral  SpO2: 96% 94% 95% 94%  Weight:  72.5 kg    Height:        GEN- NAD,  A&O x 3, normal affect HEENT: Normocephalic, atraumatic Lungs- CTAB, Normal effort.  Heart- Regular rate and rhythm, No M/G/R.  GI- Soft, NT, ND.  Extremities- No clubbing, cyanosis, or edema   Radiology/Studies:  Echo 12/02/2023 LVEF 20-25%, Severely reduced RV function, Mod LAE (42.3 A2C, 30.5 A4C), Mod RAE, trivial MR, Mod TR  TEE 12/04/2023 LVEF <20%, severe RV dysfunction, severe LAE, Severe RAE, Mod MR, mild Mor TR, trivial AI.    EKG:2/3 post [facility] showed sinus brady at 58 bpm with QTc ~430-440 (personally reviewed)  TELEMETRY: NSR 60s currently, was in AF in the 120-130 range earlier today. (personally reviewed)  Assessment/Plan:  Persistent AF with RVR Suspect exacerbated her HF AAD is limited in setting of LV dysfunction Hesitant to use amiodarone with ? Pulmonary fibrosis. Has never required O2 CrCl is variable around 60, so would start on 250 mcg of Tikosyn  and not higher.  Pt is agreeable for Tikosyn  load. Potentially would be home Friday if QT remains stable.   Acute on Chronic systolic CHF NICM  EF <20% this admission, chronically down, likely further in setting of AF RVR Normal cors by [facility] 04/2016 Limits AAD use  Pulmonary Fibrosis H/o Tobacco abuse Emphysema CT 01/2021 showed chronic interstitial changes raising the question of pulmonary fibrosis* Recommend pulmonary follow up Consider High res CT to further quantify  For questions or updates, please contact [facility] Please consult [email or web address] for contact info under Cardiology/STEMI.  Signed,  Ozell Prentice Passey, PA-C  12/05/2023 9:32 AM

## 2023-12-04 NOTE — Assessment & Plan Note (Signed)
Chronic, likely in the setting of dependent edema. DVT U/S negative for acute DVT. - Continue to monitor

## 2023-12-04 NOTE — Anesthesia Postprocedure Evaluation (Signed)
Anesthesia Post Note  Patient: Brandilynn Taormina  Procedure(s) Performed: TRANSESOPHAGEAL ECHOCARDIOGRAM CARDIOVERSION     Patient location during evaluation: PACU Anesthesia Type: General Level of consciousness: awake and alert, oriented and patient cooperative Pain management: pain level controlled Vital Signs Assessment: post-procedure vital signs reviewed and stable Respiratory status: spontaneous breathing, nonlabored ventilation and respiratory function stable Cardiovascular status: blood pressure returned to baseline and stable Postop Assessment: no apparent nausea or vomiting Anesthetic complications: no   No notable events documented.  Last Vitals:  Vitals:   12/04/23 1122 12/04/23 1244  BP: (!) 128/58 122/86  Pulse: 64 62  Resp: 17 16  Temp: 36.7 C   SpO2: 98% 95%    Last Pain:  Vitals:   12/04/23 1244  TempSrc:   PainSc: 8                  Lannie Fields

## 2023-12-04 NOTE — H&P (View-Only) (Signed)
Patient Name: Donna Herrera Date of Encounter: 12/04/2023 [facility] Cardiologist: Dietrich Pates, MD   Interval Summary  .    Feeling well.  Anxious about having her procedure.  Vital Signs .    Vitals:   12/04/23 0046 12/04/23 0400 12/04/23 0410 12/04/23 0722  BP: 106/63  (!) 109/55 112/85  Pulse: 92  82 98  Resp: 19 12 17 17   Temp: 98.7 F (37.1 C)  99.1 F (37.3 C) 98.4 F (36.9 C)  TempSrc: Oral  Oral Oral  SpO2: 95%  97% 95%  Weight:   73.5 kg   Height:        Intake/Output Summary (Last 24 hours) at 12/04/2023 0832 Last data filed at 12/04/2023 0419 Gross per 24 hour  Intake 720 ml  Output 750 ml  Net -30 ml      12/04/2023    4:10 AM 12/03/2023    4:27 AM 12/02/2023    3:58 PM  Last 3 Weights  Weight (lbs) 162 lb 0.6 oz 161 lb 13.1 oz 167 lb 1.7 oz  Weight (kg) 73.5 kg 73.4 kg 75.8 kg      Telemetry/ECG    Atrial fibrillation.  PVCs.  Rate mostly less than 100 bpm.- Personally Reviewed  Echo 12/02/23:  1. No evidence of LV thrombus. Left ventricular ejection fraction, by  estimation, is 20 to 25%. The left ventricle has severely decreased  function. The left ventricle demonstrates global hypokinesis. The left  ventricular internal cavity size was  moderately dilated. Left ventricular diastolic parameters are  indeterminate. Elevated left ventricular end-diastolic pressure.   2. Right ventricular systolic function is severely reduced. The right  ventricular size is normal. There is mildly elevated pulmonary artery  systolic pressure. The estimated right ventricular systolic pressure is  40.0 mmHg.   3. Left atrial size was moderately dilated.   4. Right atrial size was moderately dilated.   5. The mitral valve is normal in structure. Trivial mitral valve  regurgitation. No evidence of mitral stenosis.   6. The tricuspid valve is abnormal. Tricuspid valve regurgitation is  moderate.   7. The aortic valve was not well visualized. Aortic  valve regurgitation  is not visualized. No aortic stenosis is present.   8. Pulmonic valve regurgitation not assessed.   9. The inferior vena cava is normal in size with greater than 50%  respiratory variability, suggesting right atrial pressure of 3 mmHg.     Physical Exam .    VS:  BP 112/85 (BP Location: Right Arm)   Pulse 98   Temp 98.4 F (36.9 C) (Oral)   Resp 17   Ht 5\' 6"  (1.676 m)   Wt 73.5 kg   SpO2 95%   BMI 26.15 kg/m  , BMI Body mass index is 26.15 kg/m. GENERAL:  Well appearing HEENT: Pupils equal round and reactive, fundi not visualized, oral mucosa unremarkable NECK:  No jugular venous distention, waveform within normal limits, carotid upstroke brisk and symmetric, no bruits, no thyromegaly LUNGS:  Clear to auscultation bilaterally HEART: Irregularly irregular.  PMI not displaced or sustained,S1 and S2 within normal limits, no S3, no S4, no clicks, no rubs, no murmurs ABD:  Flat, positive bowel sounds normal in frequency in pitch, no bruits, no rebound, no guarding, no midline pulsatile mass, no hepatomegaly, no splenomegaly EXT:  2 plus pulses throughout, no edema, no cyanosis no clubbing SKIN:  No rashes no nodules NEURO:  Cranial nerves II through XII grossly intact, motor  grossly intact throughout [facility]:  Cognitively intact, oriented to person place and time   Assessment & Plan .     29F admitted with new onset acute systolic heart failure and new atrial fibrillation.  # Acute systolic and diastolic HF: LVEF this admission 20-25%.  This is new.  She now appears to be euvolemic.  Given her reduced systolic function and new onset atrial fibrillation, planning for TEE/DCCV today.  She reports 1 UTI in the past.  She is on carvedilol, hydralazine and spironolactone.  Will plan to stop the hydralazine and start Entresto instead now that we are not actively diuresing her with IV diuretics.  Will also start SGLT2 inhibitor.  She will need an echocardiogram in 3  months.  If systolic function remains reduced to recommend ischemic evaluation.  # Persistent atrial fibrillation:  Continue Eliquis and carvedilol.  Going for TEE/DCCV today.   # Hyperlipidemia: Statin intolerant.  Continue Zetia.   For questions or updates, please contact [facility] Please consult [email or web address] for contact info under        Signed, Chilton Si, MD

## 2023-12-04 NOTE — Plan of Care (Signed)
Secure chat by RN regarding patient conversion back into A-fib with RVR to heart rate in 140s.  Paged cardiology NP on-call, Brion Aliment, who will likely add antiarrhythmic.  Elberta Fortis, MD 12/04/2023, 6:27 PM PGY-2, [facility] Family Medicine Service pager [phone]

## 2023-12-04 NOTE — Assessment & Plan Note (Addendum)
BP in the 120-140/70-110s. On Coreg BID, Hydralazine 50 TID, and Spironolactone 12.5 mg. Cardiology discontinued Hydralazine and replaced with Entresto. Currently BP in the 120s/80s. - Monitor VS

## 2023-12-04 NOTE — Progress Notes (Signed)
Patient from home, had TEE and Cardioversion done today. HH PT/OT recommendations noted. Patient has used [facility] in the past, referral sent to [place] with [facility]. Currently maxed out with patient insurance coverage, but will try to see if she can accept patient for [facility] PT/OT.

## 2023-12-04 NOTE — Progress Notes (Signed)
   Heart Failure Stewardship Pharmacist Progress Note   PCP: Penne Lash, MD PCP-Cardiologist: Dietrich Pates, MD    HPI:  77 yo F with PMH of CHF, NICM, aortic atherosclerosis, COPD, GERD, prediabetes, and pulmonary fibrosis.   Presented to the ED on 1/31 with shortness of breath, orthopnea/PND, dizziness, and LE edema for 2 weeks. In the ED, was hypertensive, tachycardic, and noted to have 2+ pitting edema. BNP 495 and troponins flat. CXR with cardiomegaly without congestive failure. CTA negative for PE. Given 1 dose of IV lasix 40 mg. Was also noted to be in afib with episodes of RVR. Cardiology consulted. ECHO 2/1 showed LVEF reduced to 20-25% (was 50-55% 01/2023), global hypokinesis, RV severely reduced, mildly elevated PA pressure. S/p successful TEE/DCCV on 2/3. LVEF 19%.   Denies shortness of breath. Reports throat irritation after TEE this morning. LE has improved - trace edema on exam. HR 50s in the room. Reviewed deductible and copay information with the patient - she reports she is on limited income and would have difficulty affording this. Obtained household income information to determine eligibility for [employer] grant. She states her kids usually travel to the pharmacy to pick up her medications for her.   Current HF Medications: Beta Blocker: carvedilol 6.25 mg BID ACE/ARB/ARNI: Entresto 24/26 mg BID MRA: spironolactone 12.5 mg daily  Prior to admission HF Medications: Diuretic: furosemide 20 mg daily MWF Beta blocker: carvedilol 3.125 mg BID MRA: spironolactone 12.5 mg daily  Pertinent Lab Values: Serum creatinine 1.10, BUN 15, Potassium 4.4, Sodium 135, BNP 495.9, Magnesium 2.4, A1c 6.0   Vital Signs: Weight: 162 lbs (admission weight: 169 lbs) Blood pressure: 110/50-80s  Heart rate: 70-90s  I/O: net even yesterday; net -2.4L since admission  Medication Assistance / Insurance Benefits Check: Does the patient have prescription insurance?  Yes Type of insurance  plan: UHC Medicare  Does the patient qualify for medication assistance through manufacturers or grants?   Yes Eligible grants and/or patient assistance programs: may be eligible for healthwell grant Medication assistance applications in progress: healthwell grant  Medication assistance applications approved: none Approved medication assistance renewals will be completed by: pending  Outpatient Pharmacy:  Prior to admission [facility] Is the patient willing to use [facility] TOC pharmacy at discharge? Yes Is the patient willing to transition their outpatient pharmacy to utilize a [facility] outpatient pharmacy?   No    Assessment: 1. Acute on chronic systolic CHF (LVEF 20-25%), due to NICM. NYHA class II symptoms. - Does not appear volume overloaded on exam. Strict I/Os and daily weights.  - Continue carvedilol 6.25 mg BID, may need to decrease post DCCV if HR remains low - Agree with starting Entresto 24/26 mg BID and stopping hydralazine - Continue spironolactone 12.5 mg daily - Consider starting Farxiga 10 mg daily   Plan: 1) Medication changes recommended at this time: - Start Farxiga 10 mg daily - May need to reduce carvedilol  2) Patient assistance: - Has $266 deductible left on her insurance plan - Entresto copay after deductible met $47 - Farxiga/Jardiance copay after deductible met $47 - Patient reports she is unable to afford her medications. Will look into eligibility for [employer] grant.   3)  Education  - Initial education complete - Full education to be completed prior to discharge  Sharen Hones, PharmD, BCPS Heart Failure Stewardship Pharmacist Phone [phone]

## 2023-12-04 NOTE — Discharge Instructions (Signed)

## 2023-12-04 NOTE — Assessment & Plan Note (Addendum)
Remains in Afib, mostly rate controlled with episodes of RVR.  Transitioned from heparin to Eliquis.  Continue Coreg 6.25 mg twice daily. TEE/cardioversion today and she was successfully converted to NSR. TEE demonstrated EF <20%, LV with severely decreased function, LV global hypokinesis, RA severely dilated, moderate MVR, mild-mod TVR, trivial AVR, and mild G2 layered plaque involving the descending aorta. - EP consulted, appreciate recommendations - Continue Eliquis - Continue Coreg - Replete electrolyte goals: K>4 and Mag>2

## 2023-12-04 NOTE — Interval H&P Note (Signed)
History and Physical Interval Note:  12/04/2023 9:52 AM  Donna Herrera  has presented today for surgery, with the diagnosis of afib.  The various methods of treatment have been discussed with the patient and family. After consideration of risks, benefits and other options for treatment, the patient has consented to  Procedure(s): TRANSESOPHAGEAL ECHOCARDIOGRAM (N/A) CARDIOVERSION (N/A) as a surgical intervention.  The patient's history has been reviewed, patient examined, no change in status, stable for surgery.  I have reviewed the patient's chart and labs.  Questions were answered to the patient's satisfaction.    NPO for TEE/DCCV.   [name], MD, [facility]  [facility]  [address], [address] [place], [place] [address] [phone]  9:53 AM

## 2023-12-04 NOTE — Anesthesia Preprocedure Evaluation (Addendum)
Anesthesia Evaluation  Patient identified by MRN, date of birth, ID band Patient awake    Reviewed: Allergy & Precautions, NPO status , Patient's Chart, lab work & pertinent test results, reviewed documented beta blocker date and time   Airway Mallampati: I  TM Distance: >3 FB Neck ROM: Full    Dental  (+) Edentulous Upper, Edentulous Lower   Pulmonary COPD, former smoker Quit smoking 2017, 28 pack year history  Pulmonary fibrosis    Pulmonary exam normal breath sounds clear to auscultation       Cardiovascular hypertension, Pt. on home beta blockers and Pt. on medications pulmonary hypertension (mild pHTN)+CHF (LVEF 20-25%)  Normal cardiovascular exam+ dysrhythmias Atrial Fibrillation  Rhythm:Regular Rate:Normal  Echo 12/02/23  1. No evidence of LV thrombus. Left ventricular ejection fraction, by  estimation, is 20 to 25%. The left ventricle has severely decreased  function. The left ventricle demonstrates global hypokinesis. The left  ventricular internal cavity size was moderately dilated. Left ventricular diastolic parameters are indeterminate. Elevated left ventricular end-diastolic pressure.   2. Right ventricular systolic function is severely reduced. The right  ventricular size is normal. There is mildly elevated pulmonary artery  systolic pressure. The estimated right ventricular systolic pressure is  40.0 mmHg.   3. Left atrial size was moderately dilated.   4. Right atrial size was moderately dilated.   5. The mitral valve is normal in structure. Trivial mitral valve  regurgitation. No evidence of mitral stenosis.   6. The tricuspid valve is abnormal. Tricuspid valve regurgitation is  moderate.   7. The aortic valve was not well visualized. Aortic valve regurgitation  is not visualized. No aortic stenosis is present.   8. Pulmonic valve regurgitation not assessed.   9. The inferior vena cava is normal in size with  greater than 50%  respiratory variability, suggesting right atrial pressure of 3 mmHg.     Neuro/Psych  PSYCHIATRIC DISORDERS  Depression    negative neurological ROS     GI/Hepatic Neg liver ROS,GERD  Controlled,,  Endo/Other  negative endocrine ROS    Renal/GU Renal InsufficiencyRenal diseaseCR 1.1  negative genitourinary   Musculoskeletal  (+) Arthritis , Osteoarthritis,    Abdominal   Peds  Hematology negative hematology ROS (+)   Anesthesia Other Findings   Reproductive/Obstetrics negative OB ROS                             Anesthesia Physical Anesthesia Plan  ASA: 4  Anesthesia Plan: General   Post-op Pain Management:    Induction:   PONV Risk Score and Plan: 2 and Propofol infusion and TIVA  Airway Management Planned: Natural Airway and Simple Face Mask  Additional Equipment: None  Intra-op Plan:   Post-operative Plan:   Informed Consent: I have reviewed the patients History and Physical, chart, labs and discussed the procedure including the risks, benefits and alternatives for the proposed anesthesia with the patient or authorized representative who has indicated his/her understanding and acceptance.       Plan Discussed with: CRNA  Anesthesia Plan Comments:         Anesthesia Quick Evaluation

## 2023-12-04 NOTE — Progress Notes (Signed)
Notified MD of rhythm change from sinus brady to A fib per central telemetry.  Patient resting in bed, eating dinner, no complaints of pain.

## 2023-12-04 NOTE — Assessment & Plan Note (Addendum)
CTPE negative for PE. Echo showed severely reduced EF <20%. Possible tachycardia mediated cardiomyopathy per EP.  Given 1 dose of 40 mg IV Lasix 2/1; euvolemic on exam today.  Cardiology started Entresto 24-26 mg BID this PM and discontinued Hydralazine. Continue Coreg 6.25 mg BID and Spironolactone 12.5 daily. - Daily weights  - Strict I/O - Monitor volume status, diurese as appropriate - Optimize GDMT as able, BP permitting - AM BMP and Mag

## 2023-12-04 NOTE — Assessment & Plan Note (Addendum)
New onset over the past 6 months, no sources of bleeding. Hgb stable.  Last colonoscopy in 2021, no further recommended screening per GI. Iron studies with slightly low sat ratio. Hgb stable this AM at 10.3.  - Ferrous sulfate 325 mg every other day

## 2023-12-04 NOTE — Progress Notes (Signed)
Daily Progress Note Intern Pager: [phone]  Patient name: Donna Herrera Medical record number: [id number] Date of birth: 1946-07-11 Age: 78 y.o. Gender: female  Primary Care Provider: Penne Lash, MD Consultants: EP Code Status: Full Code  Pt Overview and Major Events to Date:  1/31: Admitted  Assessment and Plan: Jamesia Linnen is 78 yo female w/ past medical history of aortic atherosclerosis, cervical DDD, DJD, GERD, CHF, Vit D deficiency, prediabetes, OA, pulmonary fibrosis, high risk fall presenting with new onset A-fib. Transitioned to DOAC yesterday, will get TEE/cardioversion prior to discharge.  Assessment & Plan New onset a-fib (HCC) Remains in Afib, mostly rate controlled with episodes of RVR.  Transitioned from heparin to Eliquis.  Continue Coreg 6.25 mg twice daily. TEE/cardioversion today and she was successfully converted to NSR. TEE demonstrated EF <20%, LV with severely decreased function, LV global hypokinesis, RA severely dilated, moderate MVR, mild-mod TVR, trivial AVR, and mild G2 layered plaque involving the descending aorta. - EP consulted, appreciate recommendations - Continue Eliquis - Continue Coreg - Replete electrolyte goals: K>4 and Mag>2 (HFpEF) heart failure with preserved ejection fraction (HCC) CTPE negative for PE. Echo showed severely reduced EF <20%. Possible tachycardia mediated cardiomyopathy per EP.  Given 1 dose of 40 mg IV Lasix 2/1; euvolemic on exam today.  Cardiology started Entresto 24-26 mg BID this PM and discontinued Hydralazine. Continue Coreg 6.25 mg BID and Spironolactone 12.5 daily. - Daily weights  - Strict I/O - Monitor volume status, diurese as appropriate - Optimize GDMT as able, BP permitting - AM BMP and Mag Bilateral leg pain Chronic, likely in the setting of dependent edema. DVT U/S negative for acute DVT. - Continue to monitor Normocytic anemia New onset over the past 6 months, no sources of  bleeding. Hgb stable.  Last colonoscopy in 2021, no further recommended screening per GI. Iron studies with slightly low sat ratio. Hgb stable this AM at 10.3.  - Ferrous sulfate 325 mg every other day Essential hypertension BP in the 120-140/70-110s. On Coreg BID, Hydralazine 50 TID, and Spironolactone 12.5 mg. Cardiology discontinued Hydralazine and replaced with Entresto. Currently BP in the 120s/80s. - Monitor VS Right hip pain S/p R total hip replacement. Has a hx of SCFE and DJD. Complains of R hip pain to light palpation. Imaging revealed old fusion of L hip, R hip arthroplasty with acetabular protrusion & superior subluxation & bone resorption around the proximal femur. Follows orthopedics outpatient. R hip XR with no acute changes. - Pain regimen: Lidocaine patch q24h. Tylenol for mild pain and Oxycodone 2.5-5 mg for moderate/severe pain, K pad as tolerated, consider Voltaren gel  - PT/OT - OOB as tolerated  AKI (acute kidney injury) (HCC) Cr bumped from 0.8 > 1.10. Monitor with adding on Entresto.  - Encourage PO ad lib - AM BMP  Chronic and Stable Problems:  COPD: Albuterol prn  Gout: Continue home allopurinol. Chronic Right Hip Pain: Takes Oxycodone 5 mg q6h PRN at home. Added on Oxycodone 2.5-5mg  q6h PRN for moderate/severe pain and Lidocaine patch q24h. Anxiety/Depression: Continue Cymbalta 30 mg daily Vitamin D deficiency: Contine D3 5000u daily HLD: Continue Zetia 10 mg daily.  Lipid panel repeated 4 weeks ago demonstrated LDL 138. GERD: Continue Famotidine 20 mg daily PRN Neuropathy: Continue Gabapentin 300 mg nightly Prediabetes: A1c 4 weeks ago 6.0.  HTN: Mildly hypertensive, on Coreg and spironolactone. D/c Hydral and started Entresto.  FEN/GI: Heart healthy PPx: Eliquis Dispo: Home pending workup and management  Subjective:  Patient is anxious this morning about getting her TEE/cardioversion. She otherwise has no major complaints this morning.    Objective: Temp:  [97.6 F (36.4 C)-99.1 F (37.3 C)] 98 F (36.7 C) (02/03 1122) Pulse Rate:  [51-98] 62 (02/03 1244) Resp:  [12-22] 16 (02/03 1244) BP: (95-128)/(53-92) 122/86 (02/03 1244) SpO2:  [91 %-98 %] 95 % (02/03 1244) Weight:  [73.5 kg] 73.5 kg (02/03 0410) Physical Exam: General: Resting comfortably in bed in NAD.  Cardiovascular: Irregularly irregular. No M/R/G  Respiratory: CTAB. Normal WOB on RA. Abdomen: Soft, non-tender, non-distended Extremities: No BLE edema.   Laboratory: Most recent CBC Lab Results  Component Value Date   WBC 5.7 12/04/2023   HGB 10.3 (L) 12/04/2023   HCT 33.1 (L) 12/04/2023   MCV 88.7 12/04/2023   PLT 256 12/04/2023   Most recent BMP    Latest Ref Rng & Units 12/04/2023    2:54 AM  BMP  Glucose 70 - 99 mg/dL 95   BUN 8 - 23 mg/dL 15   Creatinine 9.60 - 1.00 mg/dL 4.54   Sodium 098 - 119 mmol/L 135   Potassium 3.5 - 5.1 mmol/L 4.4   Chloride 98 - 111 mmol/L 101   CO2 22 - 32 mmol/L 25   Calcium 8.9 - 10.3 mg/dL 8.7    Imaging/Diagnostic Tests: No new imaging.  Fortunato Curling, DO 12/04/2023, 1:53 PM  PGY-1, [facility] FPTS Intern pager: [phone], text pages welcome Secure chat group [facility] Teaching Service

## 2023-12-04 NOTE — Transfer of Care (Signed)
Immediate Anesthesia Transfer of Care Note  Patient: Donna Herrera  Procedure(s) Performed: TRANSESOPHAGEAL ECHOCARDIOGRAM CARDIOVERSION  Patient Location: Cath Lab  Anesthesia Type:MAC  Level of Consciousness: drowsy and patient cooperative  Airway & Oxygen Therapy: Patient Spontanous Breathing and Patient connected to nasal cannula oxygen  Post-op Assessment: Report given to RN, Post -op Vital signs reviewed and stable, and Patient moving all extremities X 4  Post vital signs: Reviewed and stable  Last Vitals:  Vitals Value Taken Time  BP 95/53 12/04/23 1045  Temp    Pulse 55 12/04/23 1046  Resp 21 12/04/23 1046  SpO2 94 % 12/04/23 1046  Vitals shown include unfiled device data.  Last Pain:  Vitals:   12/04/23 0940  TempSrc:   PainSc: 0-No pain         Complications: No notable events documented.

## 2023-12-04 NOTE — Progress Notes (Signed)
    Subjective    Asked to see pt do to recurrent Afib.  Pt already back in sinus on my arrival.  She's had at least one brief and two long paroxysms of Afib since her cardioversion earlier today.  She noted a mild globus sensation but was otw asymptomatic.  Objective    Vitals:   12/04/23 1244 12/04/23 1628  BP: 122/86 102/85  Pulse: 62 77  Resp: 16 18  Temp:  98.5 F (36.9 C)  SpO2: 95% 94%   Pleasant, NAD, AAOx3.  Lungs CTA. Cor RRR, no murmurs. Abd soft, nt/nd/bs+x4. Ext no edema.  Lab Results  Component Value Date   WBC 5.7 12/04/2023   HGB 10.3 (L) 12/04/2023   HCT 33.1 (L) 12/04/2023   MCV 88.7 12/04/2023   PLT 256 12/04/2023    Lab Results  Component Value Date   CREATININE 1.10 (H) 12/04/2023   BUN 15 12/04/2023   NA 135 12/04/2023   K 4.4 12/04/2023   CL 101 12/04/2023   CO2 25 12/04/2023   Recent Labs  Lab 12/01/23 1529 12/01/23 1729  TROPONINIHS 16 17     BNP    Component Value Date/Time   BNP 495.9 (H) 12/01/2023 1515   BNP 355.0 (H) 01/14/2015 1217    ProBNP    Component Value Date/Time   PROBNP 371 06/27/2023 0931     Assessment & Plan    1.  PAF:  s/p TEE/DCCV earlier today w/ runs of PAF since.  She had a prolonged episode this afternoon/evening, which broke spontaneously.  She is relatively asymptomatic.  Chart/meds/tele reviewed.  She tends to run slow when in sinus, w/ freq dips into the 50's.  In that setting, addition of digoxin or further titration of ? blocker therapy, not ideal.  With LV dysfxn, she is likely to have recurrent Afib, and AAD therapy is likely appropriate.  Unfortunately, she is a poor candidate for amio due to ILD on chest CT and sotalol/multaq/1a AAD in setting of structural heart dzs/CHF.  QT is nl @ 430.  Mg/K have been nl.  Creat 1.1 this AM, but has otw  been nl.  Tikosyn may be an option. Will ask EP to see in AM to eval further (Discussed w/ Dr. Jacques Navy this evening).  Nicolasa Ducking, NP 12/04/2023, 7:10 PM

## 2023-12-04 NOTE — Telephone Encounter (Addendum)
Patient Product/process development scientist completed.    The patient is insured through [facility]. Patient has Medicare and is not eligible for a copay card, but may be able to apply for patient assistance or Medicare RX Payment Plan (Patient Must reach out to their plan, if eligible for payment plan), if available.    Ran test claim for Eliquis 5 mg and the current 30 day co-pay is $313.76 due to a deductible.  Ran test claim for Entresto 24-26 mg and the current 30 day co-pay is $313.76 due to a deductible.  Ran test claim for Xarelto 15 mg and the current 30 day co-pay is $313.76 due to a deductible.  This test claim was processed through [facility]- copay amounts may vary at other pharmacies due to pharmacy/plan contracts, or as the patient moves through the different stages of their insurance plan.     Roland Earl, CPHT Pharmacy Technician III Certified Patient Advocate [facility] Patient Advocate Team Direct Number: [phone]  Fax: [phone]

## 2023-12-04 NOTE — Assessment & Plan Note (Addendum)
Cr bumped from 0.8 > 1.10. Monitor with adding on Entresto.  - Encourage PO ad lib - AM BMP

## 2023-12-04 NOTE — Telephone Encounter (Signed)
Patient Advocate Encounter  Test billing for Farxiga/Jardiance returned the following:  $266.76 left on deductible  30 days $313.76  90 days $407.76   After deductible is met, copays show as $47 for 30 days, $141 for 90 days   Burnell Blanks, CPhT Rx Patient Advocate Phone: [phone]

## 2023-12-04 NOTE — Plan of Care (Signed)

## 2023-12-04 NOTE — Progress Notes (Signed)
Patient Name: Donna Herrera Date of Encounter: 12/04/2023 [facility] Cardiologist: Dietrich Pates, MD   Interval Summary  .    Feeling well.  Anxious about having her procedure.  Vital Signs .    Vitals:   12/04/23 0046 12/04/23 0400 12/04/23 0410 12/04/23 0722  BP: 106/63  (!) 109/55 112/85  Pulse: 92  82 98  Resp: 19 12 17 17   Temp: 98.7 F (37.1 C)  99.1 F (37.3 C) 98.4 F (36.9 C)  TempSrc: Oral  Oral Oral  SpO2: 95%  97% 95%  Weight:   73.5 kg   Height:        Intake/Output Summary (Last 24 hours) at 12/04/2023 0832 Last data filed at 12/04/2023 0419 Gross per 24 hour  Intake 720 ml  Output 750 ml  Net -30 ml      12/04/2023    4:10 AM 12/03/2023    4:27 AM 12/02/2023    3:58 PM  Last 3 Weights  Weight (lbs) 162 lb 0.6 oz 161 lb 13.1 oz 167 lb 1.7 oz  Weight (kg) 73.5 kg 73.4 kg 75.8 kg      Telemetry/ECG    Atrial fibrillation.  PVCs.  Rate mostly less than 100 bpm.- Personally Reviewed  Echo 12/02/23:  1. No evidence of LV thrombus. Left ventricular ejection fraction, by  estimation, is 20 to 25%. The left ventricle has severely decreased  function. The left ventricle demonstrates global hypokinesis. The left  ventricular internal cavity size was  moderately dilated. Left ventricular diastolic parameters are  indeterminate. Elevated left ventricular end-diastolic pressure.   2. Right ventricular systolic function is severely reduced. The right  ventricular size is normal. There is mildly elevated pulmonary artery  systolic pressure. The estimated right ventricular systolic pressure is  40.0 mmHg.   3. Left atrial size was moderately dilated.   4. Right atrial size was moderately dilated.   5. The mitral valve is normal in structure. Trivial mitral valve  regurgitation. No evidence of mitral stenosis.   6. The tricuspid valve is abnormal. Tricuspid valve regurgitation is  moderate.   7. The aortic valve was not well visualized. Aortic  valve regurgitation  is not visualized. No aortic stenosis is present.   8. Pulmonic valve regurgitation not assessed.   9. The inferior vena cava is normal in size with greater than 50%  respiratory variability, suggesting right atrial pressure of 3 mmHg.     Physical Exam .    VS:  BP 112/85 (BP Location: Right Arm)   Pulse 98   Temp 98.4 F (36.9 C) (Oral)   Resp 17   Ht 5\' 6"  (1.676 m)   Wt 73.5 kg   SpO2 95%   BMI 26.15 kg/m  , BMI Body mass index is 26.15 kg/m. GENERAL:  Well appearing HEENT: Pupils equal round and reactive, fundi not visualized, oral mucosa unremarkable NECK:  No jugular venous distention, waveform within normal limits, carotid upstroke brisk and symmetric, no bruits, no thyromegaly LUNGS:  Clear to auscultation bilaterally HEART: Irregularly irregular.  PMI not displaced or sustained,S1 and S2 within normal limits, no S3, no S4, no clicks, no rubs, no murmurs ABD:  Flat, positive bowel sounds normal in frequency in pitch, no bruits, no rebound, no guarding, no midline pulsatile mass, no hepatomegaly, no splenomegaly EXT:  2 plus pulses throughout, no edema, no cyanosis no clubbing SKIN:  No rashes no nodules NEURO:  Cranial nerves II through XII grossly intact, motor  grossly intact throughout [facility]:  Cognitively intact, oriented to person place and time   Assessment & Plan .     29F admitted with new onset acute systolic heart failure and new atrial fibrillation.  # Acute systolic and diastolic HF: LVEF this admission 20-25%.  This is new.  She now appears to be euvolemic.  Given her reduced systolic function and new onset atrial fibrillation, planning for TEE/DCCV today.  She reports 1 UTI in the past.  She is on carvedilol, hydralazine and spironolactone.  Will plan to stop the hydralazine and start Entresto instead now that we are not actively diuresing her with IV diuretics.  Will also start SGLT2 inhibitor.  She will need an echocardiogram in 3  months.  If systolic function remains reduced to recommend ischemic evaluation.  # Persistent atrial fibrillation:  Continue Eliquis and carvedilol.  Going for TEE/DCCV today.   # Hyperlipidemia: Statin intolerant.  Continue Zetia.   For questions or updates, please contact [facility] Please consult [email or web address] for contact info under        Signed, Chilton Si, MD

## 2023-12-04 NOTE — CV Procedure (Signed)
   TRANSESOPHAGEAL ECHOCARDIOGRAM GUIDED DIRECT CURRENT CARDIOVERSION  NAME:  Donna Herrera    MRN: [id number] DOB:  08-24-1946    ADMIT DATE: 12/01/2023  INDICATIONS: Symptomatic atrial flutter  PROCEDURE:   Informed consent was obtained prior to the procedure. The risks, benefits and alternatives for the procedure were discussed and the patient comprehended these risks.  Risks include, but are not limited to, cough, sore throat, vomiting, nausea, somnolence, esophageal and stomach trauma or perforation, bleeding, low blood pressure, aspiration, pneumonia, infection, trauma to the teeth and death.    After a procedural time-out, the oropharynx was anesthetized and the patient was sedated by the anesthesia service. The transesophageal probe was inserted in the esophagus and stomach without difficulty and multiple views were obtained. Anesthesia was monitored by Dr. Salvadore Farber.   COMPLICATIONS:    Complications: No complications Patient tolerated procedure well.  KEY FINDINGS:  No LAA thrombus.  No PFO by color Severely reduced LV function, EF 19% by 3D.  Moderate to severe MR Full Report to follow.   CARDIOVERSION:     Indications:  Symptomatic Atrial Fibrillation  Procedure Details:  Once the TEE was complete, the patient had the defibrillator pads placed in the anterior and posterior position. Once an appropriate level of sedation was confirmed, the patient was cardioverted x 1 with 200J of biphasic synchronized energy.  The patient converted to NSR.  There were no apparent complications.  The patient had normal neuro status and respiratory status post procedure with vitals stable as recorded elsewhere.  Adequate airway was maintained throughout and vital signs monitored per protocol.  [name], MD [facility]  [address] [place], [place] [address] [phone]  10:41 AM

## 2023-12-05 ENCOUNTER — Telehealth (HOSPITAL_COMMUNITY): Payer: Self-pay

## 2023-12-05 ENCOUNTER — Encounter (HOSPITAL_COMMUNITY): Payer: Self-pay | Admitting: Cardiovascular Disease

## 2023-12-05 ENCOUNTER — Other Ambulatory Visit (HOSPITAL_COMMUNITY): Payer: Self-pay

## 2023-12-05 ENCOUNTER — Telehealth (HOSPITAL_COMMUNITY): Payer: Self-pay | Admitting: Pharmacy Technician

## 2023-12-05 DIAGNOSIS — I509 Heart failure, unspecified: Secondary | ICD-10-CM

## 2023-12-05 DIAGNOSIS — E861 Hypovolemia: Secondary | ICD-10-CM

## 2023-12-05 DIAGNOSIS — I959 Hypotension, unspecified: Secondary | ICD-10-CM

## 2023-12-05 DIAGNOSIS — I5041 Acute combined systolic (congestive) and diastolic (congestive) heart failure: Secondary | ICD-10-CM | POA: Diagnosis not present

## 2023-12-05 DIAGNOSIS — I4891 Unspecified atrial fibrillation: Secondary | ICD-10-CM | POA: Diagnosis not present

## 2023-12-05 LAB — BASIC METABOLIC PANEL
Anion gap: 10 (ref 5–15)
Anion gap: 9 (ref 5–15)
Anion gap: 8 (ref 5–15)
Anion gap: 10 (ref 5–15)
Anion gap: 10 (ref 5–15)
BUN: 8 mg/dL (ref 8–23)
BUN: 10 mg/dL (ref 8–23)
BUN: 10 mg/dL (ref 8–23)
BUN: 9 mg/dL (ref 8–23)
BUN: 11 mg/dL (ref 8–23)
CO2: 27 mmol/L (ref 22–32)
CO2: 25 mmol/L (ref 22–32)
CO2: 25 mmol/L (ref 22–32)
CO2: 24 mmol/L (ref 22–32)
CO2: 24 mmol/L (ref 22–32)
Calcium: 9 mg/dL (ref 8.9–10.3)
Calcium: 8.5 mg/dL — ABNORMAL LOW (ref 8.9–10.3)
Calcium: 8.7 mg/dL — ABNORMAL LOW (ref 8.9–10.3)
Calcium: 9 mg/dL (ref 8.9–10.3)
Calcium: 9 mg/dL (ref 8.9–10.3)
Chloride: 100 mmol/L (ref 98–111)
Chloride: 100 mmol/L (ref 98–111)
Chloride: 101 mmol/L (ref 98–111)
Chloride: 100 mmol/L (ref 98–111)
Chloride: 100 mmol/L (ref 98–111)
Creatinine, Ser: 0.77 mg/dL (ref 0.44–1.00)
Creatinine, Ser: 0.99 mg/dL (ref 0.44–1.00)
Creatinine, Ser: 0.81 mg/dL (ref 0.44–1.00)
Creatinine, Ser: 0.8 mg/dL (ref 0.44–1.00)
Creatinine, Ser: 0.85 mg/dL (ref 0.44–1.00)
GFR, Estimated: >60 mL/min (ref >60)
GFR, Estimated: 58 mL/min — ABNORMAL LOW (ref >60)
GFR, Estimated: >60 mL/min (ref >60)
GFR, Estimated: >60 mL/min (ref >60)
GFR, Estimated: >60 mL/min (ref >60)
Glucose, Bld: 93 mg/dL (ref 70–99)
Glucose, Bld: 190 mg/dL — ABNORMAL HIGH (ref 70–99)
Glucose, Bld: 90 mg/dL (ref 70–99)
Glucose, Bld: 100 mg/dL — ABNORMAL HIGH (ref 70–99)
Glucose, Bld: 149 mg/dL — ABNORMAL HIGH (ref 70–99)
Potassium: 4.5 mmol/L (ref 3.5–5.1)
Potassium: 3.7 mmol/L (ref 3.5–5.1)
Potassium: 6 mmol/L — ABNORMAL HIGH (ref 3.5–5.1)
Potassium: 5.5 mmol/L — ABNORMAL HIGH (ref 3.5–5.1)
Potassium: 5.8 mmol/L — ABNORMAL HIGH (ref 3.5–5.1)
Sodium: 137 mmol/L (ref 135–145)
Sodium: 134 mmol/L — ABNORMAL LOW (ref 135–145)
Sodium: 134 mmol/L — ABNORMAL LOW (ref 135–145)
Sodium: 134 mmol/L — ABNORMAL LOW (ref 135–145)
Sodium: 134 mmol/L — ABNORMAL LOW (ref 135–145)

## 2023-12-05 LAB — MAGNESIUM
Magnesium: 2.2 mg/dL (ref 1.7–2.4)
Magnesium: 2.2 mg/dL (ref 1.7–2.4)

## 2023-12-05 LAB — CBC
HCT: 35.4 % — ABNORMAL LOW (ref 36.0–46.0)
Hemoglobin: 11 g/dL — ABNORMAL LOW (ref 12.0–15.0)
MCH: 27.8 pg (ref 26.0–34.0)
MCHC: 31.1 g/dL (ref 30.0–36.0)
MCV: 89.6 fL (ref 80.0–100.0)
Platelets: 263 10*3/uL (ref 150–400)
RBC: 3.95 MIL/uL (ref 3.87–5.11)
RDW: 15.5 % (ref 11.5–15.5)
WBC: 6.3 10*3/uL (ref 4.0–10.5)
nRBC: 0 % (ref 0.0–0.2)

## 2023-12-05 MED ORDER — SODIUM CHLORIDE 0.9 % IV SOLN: 250.0000 mL | INTRAVENOUS | Status: DC | PRN

## 2023-12-05 MED ORDER — LOSARTAN POTASSIUM 25 MG PO TABS: 25.0000 mg | ORAL_TABLET | Freq: Every day | ORAL | Status: DC

## 2023-12-05 MED ORDER — SODIUM CHLORIDE 0.9% FLUSH: 3.0000 mL | INTRAVENOUS | Status: DC | PRN

## 2023-12-05 MED ORDER — SODIUM ZIRCONIUM CYCLOSILICATE 5 G PO PACK
5.0000 g | PACK | Freq: Once | ORAL | Status: AC
  Administered 2023-12-05: 5 g via ORAL
  Filled 2023-12-05: qty 1

## 2023-12-05 MED ORDER — SODIUM ZIRCONIUM CYCLOSILICATE 5 G PO PACK
5.0000 g | PACK | Freq: Once | ORAL | Status: DC
  Filled 2023-12-05: qty 1

## 2023-12-05 MED ORDER — CARVEDILOL 3.125 MG PO TABS: 3.1250 mg | ORAL_TABLET | Freq: Two times a day (BID) | ORAL | Status: DC

## 2023-12-05 MED ORDER — SODIUM CHLORIDE 0.9 % IV BOLUS
500.0000 mL | Freq: Once | INTRAVENOUS | Status: AC
  Administered 2023-12-05: 500 mL via INTRAVENOUS

## 2023-12-05 MED ORDER — POTASSIUM CHLORIDE 20 MEQ PO PACK
60.0000 meq | PACK | Freq: Once | ORAL | Status: AC
  Administered 2023-12-05: 60 meq via ORAL
  Filled 2023-12-05: qty 3

## 2023-12-05 MED ORDER — SODIUM CHLORIDE 0.9% FLUSH
3.0000 mL | Freq: Two times a day (BID) | INTRAVENOUS | Status: DC
  Administered 2023-12-05 – 2023-12-08 (×6): 3 mL via INTRAVENOUS

## 2023-12-05 MED ORDER — DOFETILIDE 250 MCG PO CAPS
250.0000 ug | ORAL_CAPSULE | Freq: Two times a day (BID) | ORAL | Status: DC
  Administered 2023-12-05: 250 ug via ORAL
  Filled 2023-12-05: qty 1

## 2023-12-05 NOTE — Telephone Encounter (Signed)
Pharmacy Patient Advocate Encounter  Insurance verification completed.    The patient is insured through [facility] Patient has Medicare and is not eligible for a copay card, but may be able to apply for patient assistance or Medicare RX Payment Plan (Patient Must reach out to their plan, if eligible for payment plan), if available.    Ran test claim for Tikosyn and the current 30 day co-pay is $47.66   This test claim was processed through [employer]- copay amounts may vary at other pharmacies due to [employer], or as the patient moves through the different stages of their insurance plan.

## 2023-12-05 NOTE — Assessment & Plan Note (Signed)
>>  ASSESSMENT AND PLAN FOR LONGSTANDING PERSISTENT ATRIAL FIBRILLATION (HCC) WRITTEN ON 12/05/2023  3:35 PM BY BROWN, CARINA M, MD  S/p TEE/Cardioversion which was successful, but then she reverted to A-fib spontaneously, mostly rate controlled with episodes of RVR.  Transitioned from heparin  to Eliquis .  TEE demonstrated EF <20%, LV with severely decreased function, LV global hypokinesis, RA severely dilated, moderate MVR, mild-mod TVR, trivial AVR, and mild G2 layered plaque involving the descending aorta.  Seems to sporadically go in and out of A-fib even after cardioversion, holding Coreg  at this time.  Cardiology started Tikosyn  250 mcg BID since she isn't a good candidate for amio with her ILD.   - Cardiology and EP consulted, appreciate recommendations - Holding Coreg  in setting of hypotension and low MAPs, restart as able - Tikosyn  250 mcg BID starting this PM - Replete electrolyte goals: K>4 and Mag>2 - Continue Eliquis  for now - Pending family discussion with pharmacy regarding AC and ability to afford Eliquis  vs Xarelto, if unable to cover cost will consider Lovenox 1 mg/kg BID this PM and then transition to warfarin per pharmacy

## 2023-12-05 NOTE — Progress Notes (Signed)
 Pharmacy: Dofetilide  (Tikosyn ) - Initial Consult Assessment and Electrolyte Replacement  Pharmacy consulted to assist in monitoring and replacing electrolytes in this 78 y.o. female admitted on 12/01/2023 undergoing dofetilide  initiation. First dofetilide  dose: 250mcg.  Assessment:  Patient Exclusion Criteria: If any screening criteria checked as Yes, then  patient  should NOT receive dofetilide  until criteria item is corrected.  If "Yes" please indicate correction plan.  YES  NO Patient  Exclusion Criteria Correction Plan   []   [x]   Baseline QTc interval is greater than or equal to 440 msec. IF above YES box checked dofetilide  contraindicated unless patient has ICD; then may proceed if QTc 500-550 msec or with known ventricular conduction abnormalities may proceed with QTc 550-600 msec. QTc = 0.39 Qtc post cardioversion 430-440 ms    []   [x]   Patient is known or suspected to have a digoxin level greater than 2 ng/ml: No results found for: DIGOXIN     []   [x]   Creatinine clearance less than 20 ml/min (calculated using Cockcroft-Gault, actual body weight and serum creatinine): Estimated Creatinine Clearance: 59.1 mL/min (by C-G formula based on SCr of 0.8 mg/dL).     []   [x]  Patient has received drugs known to prolong the QT intervals within the last 48 hours (phenothiazines, tricyclics or tetracyclic antidepressants, erythromycin, H-1 antihistamines, cisapride, fluoroquinolones, azithromycin , ondansetron ).   Updated information on QT prolonging agents is available to be searched on the following database:QT prolonging agents     []   [x]  Patient received a dose of a thiazide diuretic in the last 48 hours [including hydrochlorothiazide  (Oretic ) alone or in any combination including triamterene (Dyazide, Maxzide)].    []   [x]  Patient received a medication known to increase dofetilide  plasma concentrations prior to initial dofetilide  dose:  Trimethoprim  (Primsol, Proloprim ) in  the last 36 hours Verapamil  (Calan , Verelan ) in the last 36 hours or a sustained release dose in the last 72 hours Megestrol (Megace) in the last 5 days  Cimetidine (Tagamet) in the last 6 hours Ketoconazole (Nizoral) in the last 24 hours Itraconazole (Sporanox) in the last 48 hours  Prochlorperazine (Compazine) in the last 36 hours     []   [x]   Patient is known to have a history of torsades de pointes; congenital or acquired long QT syndromes.    []   [x]   Patient has received a Class 1 antiarrhythmic with less than 2 half-lives since last dose. (Disopyramide, Quinidine, Procainamide, Lidocaine , Mexiletine, Flecainide, Propafenone)    []   [x]   Patient has received amiodarone therapy in the past 3 months or amiodarone level is greater than 0.3 ng/ml.    Labs:    Component Value Date/Time   K 5.5 (H) 12/05/2023 1903   MG 2.2 12/05/2023 1056     Plan: Select One Calculated CrCl  Dose q12h  []  > 60 ml/min 500 mcg  [x]  40-60 ml/min 250 mcg  []  20-40 ml/min 125 mcg   [x]   Physician selected initial dose within range recommended for patients level of renal function - will monitor for response.  []   Physician selected initial dose outside of range recommended for patients level of renal function - will discuss if the dose should be altered at this time.   Patient has been appropriately anticoagulated with apixaban  5 mg BID.  Dosing based on initial SCr is appropriate.  Potassium: K 3.5-3.7:  Hold Tikosyn  initiation and give KCl 60 mEq po x1 and repeat BMET 2hr after dose - repeat appropriate dose if K < 4  Repeat K 6 > 5.5, expect K to trend down further.  OK to proceed with Tikosyn .  Magnesium : Mg >2: Appropriate to initiate Tikosyn , no replacement needed    Terrelle Ruffolo D. Lendell, PharmD, BCPS, BCCCP 12/05/2023, 8:45 PM

## 2023-12-05 NOTE — Assessment & Plan Note (Addendum)
 S/p R total hip replacement. Has a hx of SCFE and DJD. Complains of R hip pain to light palpation. Imaging revealed old fusion of L hip, R hip arthroplasty with acetabular protrusion & superior subluxation & bone resorption around the proximal femur. Follows orthopedics outpatient. R hip XR with no acute changes. - Pain regimen: Lidocaine patch q24h. Tylenol for mild pain and Oxycodone 2.5-5 mg for moderate/severe pain, K pad as tolerated, consider Voltaren gel  - PT/OT - OOB as tolerated

## 2023-12-05 NOTE — Assessment & Plan Note (Deleted)
Chronic, likely in the setting of dependent edema. DVT U/S negative for acute DVT. - Continue to monitor

## 2023-12-05 NOTE — Assessment & Plan Note (Addendum)
 S/p TEE/Cardioversion which was successful, but then she reverted to A-fib spontaneously, mostly rate controlled with episodes of RVR.  Transitioned from heparin  to Eliquis .  TEE demonstrated EF <20%, LV with severely decreased function, LV global hypokinesis, RA severely dilated, moderate MVR, mild-mod TVR, trivial AVR, and mild G2 layered plaque involving the descending aorta.  Seems to sporadically go in and out of A-fib even after cardioversion, holding Coreg  at this time.  Cardiology started Tikosyn  250 mcg BID since she isn't a good candidate for amio with her ILD.   - Cardiology and EP consulted, appreciate recommendations - Holding Coreg  in setting of hypotension and low MAPs, restart as able - Tikosyn  250 mcg BID starting this PM - Replete electrolyte goals: K>4 and Mag>2 - Continue Eliquis  for now - Pending family discussion with pharmacy regarding AC and ability to afford Eliquis  vs Xarelto, if unable to cover cost will consider Lovenox 1 mg/kg BID this PM and then transition to warfarin per pharmacy

## 2023-12-05 NOTE — Plan of Care (Signed)
 FMTS Interim Progress Note  Called cardiology on call to discuss patients elevated potassium and risk of giving lokelma  and dropping potassium too low. Cardiologist stated okay to give lokelma  given K ranging from 5.5-6 and patient is still okay to receive tikosyn  tonight. Discussed with nursing. Will plan to give 5 mg lokelma  and start tikosyn  as ordered.  Donna Brandilynn Taormina, MD 12/05/2023, 9:09 PM PGY-1, [facility] Family Medicine Service pager [phone]

## 2023-12-05 NOTE — Assessment & Plan Note (Addendum)
BP in the 90-100s/40-70s. On Coreg BID, Entresto BID, and Spironolactone daily, will hold for now. Soft BPs this AM with MAP of 63 once after ambulating.  500 mL NS bolus given.  - Monitor VS - Restart medications as able, BP permitting

## 2023-12-05 NOTE — Assessment & Plan Note (Addendum)
 CTPE negative for PE. TEE demonstrated EF <20%, LV with severely decreased function, LV global hypokinesis, RA severely dilated, moderate MVR, mild-mod TVR, trivial AVR, and mild G2 layered plaque involving the descending aorta.  Possible tachycardia mediated cardiomyopathy per EP.  Given 1 dose of 40 mg IV Lasix  2/1; euvolemic on exam today.  Cardiology would like to start Entresto  24-26 mg BID, Farxiga  10 mg daily and continue Coreg  6.25 mg BID, but discontinued Hydralazine .  [employer] grant approved and will cover the cost of Entresto  and Farxiga .  BP soft this AM, currently holding medications and 500 mL NS bolus given.  - Holding Entresto  24-26 mg BID, Coreg  6.25 mg BID, Spironolactone  12.5 daily - Cardiology recommended starting Farxiga  10 mg daily prior to discharge - Restart GDMT as able, BP permitting - Daily weights  - Strict I/O - Monitor volume status, diurese as appropriate - AM BMP and Mag - Repeat echo in 3 months, if systolic fxn remains reduced may require ischemic eval; will address in DC summary

## 2023-12-05 NOTE — Assessment & Plan Note (Addendum)
New onset over the past 6 months, no sources of bleeding. Hgb stable 10.3.  Last colonoscopy in 2021, no further recommended screening per GI. Iron studies with slightly low sat ratio. - Ferrous sulfate 325 mg every other day

## 2023-12-05 NOTE — Assessment & Plan Note (Addendum)
Cr bumped from 0.8 > 1.10 yesterday, but improved to 0.77 today. - Encourage PO ad lib - AM BMP

## 2023-12-05 NOTE — Progress Notes (Addendum)
 Daily Progress Note Intern Pager: [phone]  Patient name: Donna Herrera Medical record number: [id number] Date of birth: 03-15-1946 Age: 78 y.o. Gender: female  Primary Care Provider: Lonnie Earnest, MD Consultants: Cardiology, EP Code Status: Full Code  Pt Overview and Major Events to Date:  1/31: Admitted 2/3: TEE/Cardioversion > converted to NSR > spontaneous A-fib > spontaneous NSR  Assessment and Plan: Donna Herrera is 78 yo female w/ past medical history of aortic atherosclerosis, cervical DDD, DJD, GERD, CHF, Vit D deficiency, prediabetes, OA, pulmonary fibrosis, high risk fall presenting with new onset A-fib.  Transitioned to Elqiuis, however there is an ongoing discussion at this time to determine the most cost-effective/affordable AC option for her and her family.  S/p TEE/cardioversion which was successful, but then late yesterday evening patient went back into A-fib but remained asymptomatic, but is in RVR this AM.  Spontaneously converted back to NSR while ambulating, became hypotensive, and MAPs in the 50s.  Pharmacy to discuss [facility] options with family regarding cost of medications.  [employer] grant approved and will cover the cost of her Entresto  and Farxiga .  Assessment & Plan New onset a-fib [facility]) S/p TEE/Cardioversion which was successful, but then she reverted to A-fib spontaneously, mostly rate controlled with episodes of RVR.  Transitioned from heparin  to Eliquis .  TEE demonstrated EF <20%, LV with severely decreased function, LV global hypokinesis, RA severely dilated, moderate MVR, mild-mod TVR, trivial AVR, and mild G2 layered plaque involving the descending aorta.  Seems to sporadically go in and out of A-fib even after cardioversion, holding Coreg  at this time.  Cardiology started Tikosyn  250 mcg BID since she isn't a good candidate for amio with her ILD.   - Cardiology and EP consulted, appreciate recommendations - Holding Coreg  in setting of  hypotension and low MAPs, restart as able - Tikosyn  250 mcg BID starting this PM - Replete electrolyte goals: K>4 and Mag>2 - Continue Eliquis  for now - Pending family discussion with pharmacy regarding AC and ability to afford Eliquis  vs Xarelto, if unable to cover cost will consider Lovenox 1 mg/kg BID this PM and then transition to warfarin per pharmacy Acute systolic heart failure (HCC) CTPE negative for PE. TEE demonstrated EF <20%, LV with severely decreased function, LV global hypokinesis, RA severely dilated, moderate MVR, mild-mod TVR, trivial AVR, and mild G2 layered plaque involving the descending aorta.  Possible tachycardia mediated cardiomyopathy per EP.  Given 1 dose of 40 mg IV Lasix  2/1; euvolemic on exam today.  Cardiology would like to start Entresto  24-26 mg BID, Farxiga  10 mg daily and continue Coreg  6.25 mg BID, but discontinued Hydralazine .  [employer] grant approved and will cover the cost of Entresto  and Farxiga .  BP soft this AM, currently holding medications and 500 mL NS bolus given.  - Holding Entresto  24-26 mg BID, Coreg  6.25 mg BID, Spironolactone  12.5 daily - Cardiology recommended starting Farxiga  10 mg daily prior to discharge - Restart GDMT as able, BP permitting - Daily weights  - Strict I/O - Monitor volume status, diurese as appropriate - AM BMP and Mag - Repeat echo in 3 months, if systolic fxn remains reduced may require ischemic eval; will address in DC summary Normocytic anemia New onset over the past 6 months, no sources of bleeding. Hgb stable 10.3.  Last colonoscopy in 2021, no further recommended screening per GI. Iron studies with slightly low sat ratio. - Ferrous sulfate  325 mg every other day Essential hypertension BP in the 90-100s/40-70s. On Coreg  BID,  Entresto  BID, and Spironolactone  daily, will hold for now. Soft BPs this AM with MAP of 63 once after ambulating.  500 mL NS bolus given.  - Monitor VS - Restart medications as able, BP  permitting Right hip pain S/p R total hip replacement. Has a hx of SCFE and DJD. Complains of R hip pain to light palpation. Imaging revealed old fusion of L hip, R hip arthroplasty with acetabular protrusion & superior subluxation & bone resorption around the proximal femur. Follows orthopedics outpatient. R hip XR with no acute changes. - Pain regimen: Lidocaine  patch q24h. Tylenol  for mild pain and Oxycodone  2.5-5 mg for moderate/severe pain, K pad as tolerated, consider Voltaren  gel  - PT/OT - OOB as tolerated  AKI (acute kidney injury) (HCC) Cr bumped from 0.8 > 1.10 yesterday, but improved to 0.77 today. - Encourage PO ad lib - AM BMP  Chronic and Stable Problems:  COPD: Albuterol  prn  Gout: Continue home allopurinol . Chronic Right Hip Pain: Takes Oxycodone  5 mg q6h PRN at home. Added on Oxycodone  2.5-5mg  q6h PRN for moderate/severe pain and Lidocaine  patch q24h. Anxiety/Depression: Continue Cymbalta  30 mg daily Vitamin D  deficiency: Contine D3 5000u daily HLD: Continue Zetia  10 mg daily.  Lipid panel repeated 4 weeks ago demonstrated LDL 138. GERD: Continue Famotidine  20 mg daily PRN Neuropathy: Continue Gabapentin  300 mg nightly Prediabetes: A1c 4 weeks ago 6.0.  Bilateral leg pain: Chronic, likely in the setting of dependent edema. DVT U/S negative for acute DVT.  FEN/GI: Heart healthy  PPx: Eliquis   Dispo:Home pending AC discussion/stability  Subjective:  Patient is doing alright this morning other than expressing some dyspnea while ambulating likely due to her converting back to NSR during this time. She otherwise doesn't complain of dyspnea or chest pain at rest.   Has discussed medication cost assistance with her family who she reports is amenable to helping, but would like more information.   Objective: Temp:  [97.8 F (36.6 C)-98.9 F (37.2 C)] 98.2 F (36.8 C) (02/04 1154) Pulse Rate:  [60-136] 66 (02/04 1025) Resp:  [16-19] 18 (02/04 1154) BP: (71-126)/(42-85)  102/73 (02/04 1154) SpO2:  [94 %-96 %] 96 % (02/04 1025) Weight:  [72.5 kg] 72.5 kg (02/04 0325) Physical Exam: General: Resting comfortably in bed in NAD.  Cardiovascular: RRR. No M/R/G  Respiratory: CTAB. Normal WOB on RA. Abdomen: Soft, non-tender, non-distended. Normoactive bowel sounds.  Extremities: No BLE edema. No BLE pain upon palpation.  Laboratory: Most recent CBC Lab Results  Component Value Date   WBC 6.3 12/05/2023   HGB 11.0 (L) 12/05/2023   HCT 35.4 (L) 12/05/2023   MCV 89.6 12/05/2023   PLT 263 12/05/2023   Most recent BMP    Latest Ref Rng & Units 12/05/2023   10:56 AM  BMP  Glucose 70 - 99 mg/dL 809   BUN 8 - 23 mg/dL 10   Creatinine 9.55 - 1.00 mg/dL 9.00   Sodium 864 - 854 mmol/L 134   Potassium 3.5 - 5.1 mmol/L 3.7   Chloride 98 - 111 mmol/L 100   CO2 22 - 32 mmol/L 25   Calcium  8.9 - 10.3 mg/dL 8.5    Mag: 2.2  Imaging/Diagnostic Tests: No new imaging.   Janna Ferrier, DO 12/05/2023, 1:06 PM  PGY-1, [facility] Family Medicine FPTS Intern pager: [phone], text pages welcome Secure chat group [facility] Teaching Service

## 2023-12-05 NOTE — Plan of Care (Signed)

## 2023-12-05 NOTE — Progress Notes (Incomplete)
Pharmacy: Dofetilide (Tikosyn) - Initial Consult Assessment and Electrolyte Replacement  Pharmacy consulted to assist in monitoring and replacing electrolytes in this 78 y.o. female admitted on 12/01/2023 undergoing dofetilide initiation. First dofetilide dose: ***  Assessment:  Patient Exclusion Criteria: If any screening criteria checked as "Yes", then  patient  should NOT receive dofetilide until criteria item is corrected.  If "Yes" please indicate correction plan.  YES  NO Patient  Exclusion Criteria Correction Plan   []   [x]   Baseline QTc interval is greater than or equal to 440 msec. IF above YES box checked dofetilide contraindicated unless patient has ICD; then may proceed if QTc 500-550 msec or with known ventricular conduction abnormalities may proceed with QTc 550-600 msec. QTc = 0.47 Qtc post cardioversion 430-440 ms    []   [x]   Patient is known or suspected to have a digoxin level greater than 2 ng/ml: No results found for: "DIGOXIN"     []   [x]   Creatinine clearance less than 20 ml/min (calculated using Cockcroft-Gault, actual body weight and serum creatinine): Estimated Creatinine Clearance: 47.8 mL/min (by C-G formula based on SCr of 0.99 mg/dL).     []   [x]  Patient has received drugs known to prolong the QT intervals within the last 48 hours (phenothiazines, tricyclics or tetracyclic antidepressants, erythromycin, H-1 antihistamines, cisapride, fluoroquinolones, azithromycin, ondansetron).   Updated information on QT prolonging agents is available to be searched on the following database:QT prolonging agents     []   [x]  Patient received a dose of a thiazide diuretic in the last 48 hours [including hydrochlorothiazide (Oretic) alone or in any combination including triamterene (Dyazide, Maxzide)].    []   [x]  Patient received a medication known to increase dofetilide plasma concentrations prior to initial dofetilide dose:  Trimethoprim (Primsol, Proloprim) in  the last 36 hours Verapamil (Calan, Verelan) in the last 36 hours or a sustained release dose in the last 72 hours Megestrol (Megace) in the last 5 days  Cimetidine (Tagamet) in the last 6 hours Ketoconazole (Nizoral) in the last 24 hours Itraconazole (Sporanox) in the last 48 hours  Prochlorperazine (Compazine) in the last 36 hours     []   [x]   Patient is known to have a history of torsades de pointes; congenital or acquired long QT syndromes.    []   [x]   Patient has received a Class 1 antiarrhythmic with less than 2 half-lives since last dose. (Disopyramide, Quinidine, Procainamide, Lidocaine, Mexiletine, Flecainide, Propafenone)    []   [x]   Patient has received amiodarone therapy in the past 3 months or amiodarone level is greater than 0.3 ng/ml.    Labs:    Component Value Date/Time   K 3.7 12/05/2023 1056   MG 2.2 12/05/2023 1056     Plan: Select One Calculated CrCl  Dose q12h  []  > 60 ml/min 500 mcg  [x]  40-60 ml/min 250 mcg  []  20-40 ml/min 125 mcg   [x]   Physician selected initial dose within range recommended for patients level of renal function - will monitor for response.  []   Physician selected initial dose outside of range recommended for patients level of renal function - will discuss if the dose should be altered at this time.   Patient has been appropriately anticoagulated with apixaban 5 mg BID.  Potassium: K 3.5-3.7:  Hold Tikosyn initiation and give KCl 60 mEq po x1 and repeat BMET 2hr after dose - repeat appropriate dose if K < 4    Magnesium: Mg >2: Appropriate to initiate  Tikosyn, no replacement needed     Thank you for allowing pharmacy to participate in this patient's care   Jani Gravel, PharmD Clinical Pharmacist  12/05/2023 12:55 PM

## 2023-12-05 NOTE — Progress Notes (Signed)
 PT Cancellation Note  Patient Details Name: Donna Herrera MRN: [id number] DOB: [birth year]   Cancelled Treatment:    Reason Eval/Treat Not Completed: Patient declined, no reason specified;Fatigue/lethargy limiting ability to participate. Pt hypotensive in supine and reporting generalized pain. Educated her about the benefits engaging with PT can offer, discussed what a bed level session would feature, pt verbalized understanding, and still declined. Will follow-up as schedule allows.   Randall SAUNDERS, PT, DPT Acute Rehabilitation Services Office: [phone] Secure Chat Preferred  Delon CHRISTELLA Callander 12/05/2023, 2:42 PM

## 2023-12-05 NOTE — Progress Notes (Signed)
 Heart Failure Nurse Navigator Progress Note  PCP: Lonnie Mahnoor, MD PCP-Cardiologist: Okey Admission Diagnosis: New onset Atrial fibrillation, Acute on chronic congestive heart failure.  Admitted from: Home  Presentation:   Donna Herrera presented with shortness of breath, dizziness, 2+ swelling in legs x 2 weeks. BP 135/116, BNP 495.9, EKG notable for A-fib, rates 80-120's, CXR negative . Echo on 2/1 showed LVEF 20-25%,   Pateint was educated on the sign and symptoms of heart failure, daily weights, reports that her daughter will buy her a scale, education on diet/ fluid restrictions, reports to drinking a lot of water and some Mt. Dew soda, spoke about taking all medications as prescribed and attending all medical appointments. Patient verbalized her understanding of all education. A HF TOC appointment was scheduled for 12/15/2023 @ 10 am.   ECHO/ LVEF: 20-25%  Clinical Course:  Past Medical History:  Diagnosis Date   Adenomatous polyp 03/23/2020   Colonoscopy 2021 notable for adenomatous polyp. Per GI, no further follow up colonoscopies recommended given patient's age.   Allergy    Arthritis    Atherosclerosis of aorta (HCC) 07/15/2021   Candidiasis, mouth 10/19/2017   Chronic combined systolic and diastolic CHF (congestive heart failure) (HCC) 10/21/2015   Echo 3/16: EF 40%, diffuse HK, mild MR, moderate LAE, mild RAE, PASP 42 mmHg  //  Echo 12/16: Mild LVH, EF 35-40%, diffuse HK, Gr 2 DD, MAC, mild MR, mod LAE, normal RVSF, PASP 49 mmHg // Echo 5/19: EF 15-20, diff HK, Gr 2 DD, trivial AI, MAC, trivial MR, mod LAE, normal RVSF, mild TR, PASP 62 // Limited Echo 8/19: mod LVH, EF 40-45, diff HK, Gr 1 DD, MAC, mild LAE    Chronic left shoulder pain 04/10/2013   Chronic lung disease    Chronic pain    Chronic pain syndrome 07/20/2017   Confusion 06/08/2021   Depression 08/25/2016   DJD (degenerative joint disease) 10/12/2012   Multiple joint replacements by Dr. Carlos.     Dyshidrotic eczema 12/31/2014   Edentulous 07/19/2021   Emphysema lung (HCC) 03/02/2021   Long smoking history.  Noted on CT Low Dose Chest: 01/2021: IMPRESSION: Chronic interstitial changes raise the question of pulmonary fibrosis. High-resolution chest CT may prove helpful to further evaluate as clinically warranted.Emphysema (ICD10-J43.9) and Aortic Atherosclerosis (ICD10-170.0)  Recommended pulmonology referral but patient declined. Recommend continued discussion and symptom monitor   Essential hypertension 07/20/2017   GERD (gastroesophageal reflux disease)    Gout    H/O slipped capital femoral epiphysis (SCFE)    Heart failure, left, with LVEF 41-49% (HCC) 10/21/2015   transthoracic echocardiogram 06/06/18: EF appears improved to 40-45% with diffuse hypokinesis  A. Echo 3/16: EF 40%, diffuse HK, mild MR, moderate LAE, mild RAE, PASP 42 mmHg  //  B. Echo 12/16: Mild LVH, EF 35-40%, diffuse HK, grade 2 diastolic dysfunction, MAC, mild MR, moderate LAE, normal RVSF, PASP 49 mmHg   History of total right hip replacement 04/02/2019   Followed closely with orthopedist Dr. Ernie. Patient is s/p total right hip replacement in 1980's with evidence of bone loss and osteopenia making reconstruction unlikely. Plan for conservative treatment at this time with crutches for ambulation and pain control.   Hyperlipidemia    Hypertension    Hypertensive heart disease with CHF (congestive heart failure) (HCC) 10/12/2012   Loss of taste 02/06/2018   Mass of breast, left 07/21/2014   NICM (nonischemic cardiomyopathy) (HCC) 06/09/2016   A. LHC 7/17: Normal coronary arteries, EF 30-35%, LVEDP  14 mmHg   Pain in gums 04/18/2018   Pre-diabetes    Pulmonary fibrosis (HCC) 03/02/2021   CT Low Dose Chest: 01/2021: IMPRESSION: Chronic interstitial changes raise the question of pulmonary fibrosis. High-resolution chest CT may prove helpful to further evaluate as clinically warranted.Emphysema (ICD10-J43.9) and Aortic Atherosclerosis  (ICD10-170.0)  Recommended pulmonology referral but patient declined. Recommend continued discussion and symptom monitoring.   Pulmonary nodule 1 cm or greater in diameter 03/02/2021   CT low dose Chest: 01/2021: 1. Lung-RADS 4A, suspicious. 10 mm posterior left lower lobe pulmonary nodule, possibly scarring. Follow up low-dose chest CT without contrast in 3 months (please use the following order, CT CHEST LCS NODULE FOLLOW-UP W/O CM) is recommended. Alternatively, PET may be considered when there is a solid component 8mm or larger.  Plan: Follow up low dose CT ordered in 3 mon   Rib pain on left side 09/17/2020   Right hip pain 11/02/2012   Tendonitis, Achilles, left 05/24/2017   Tobacco abuse 09/29/2014   Urge incontinence 07/19/2021   Vitamin D  deficiency 12/31/2014     Social History   Socioeconomic History   Marital status: Widowed    Spouse name: Not on file   Number of children: 3   Years of education: Not on file   Highest education level: High school graduate  Occupational History   Occupation: retired  Tobacco Use   Smoking status: Former    Current packs/day: 0.00    Average packs/day: 0.5 packs/day for 55.0 years (27.5 ttl pk-yrs)    Types: Cigarettes    Start date: 57    Quit date: 2017    Years since quitting: 8.0    Passive exposure: Past   Smokeless tobacco: Never  Vaping Use   Vaping status: Never Used  Substance and Sexual Activity   Alcohol use: Not Currently   Drug use: No   Sexual activity: Not on file  Other Topics Concern   Not on file  Social History Narrative   Formerly daycare provider. Lives with husband, daughter Porter and grandkids.   Social Drivers of Corporate Investment Banker Strain: Low Risk  (12/05/2023)   Overall Financial Resource Strain (CARDIA)    Difficulty of Paying Living Expenses: Not hard at all  Food Insecurity: No Food Insecurity (12/03/2023)   Hunger Vital Sign    Worried About Running Out of Food in the Last Year: Never true     Ran Out of Food in the Last Year: Never true  Transportation Needs: No Transportation Needs (12/05/2023)   PRAPARE - Administrator, Civil Service (Medical): No    Lack of Transportation (Non-Medical): No  Physical Activity: Insufficiently Active (04/28/2023)   Exercise Vital Sign    Days of Exercise per Week: 3 days    Minutes of Exercise per Session: 30 min  Stress: No Stress Concern Present (04/28/2023)   [employer] of Occupational Health - Occupational Stress Questionnaire    Feeling of Stress : Only a little  Social Connections: Moderately Isolated (12/03/2023)   Social Connection and Isolation Panel [NHANES]    Frequency of Communication with Friends and Family: More than three times a week    Frequency of Social Gatherings with Friends and Family: Three times a week    Attends Religious Services: More than 4 times per year    Active Member of Clubs or Organizations: No    Attends Banker Meetings: Never    Marital Status: Widowed  Education Assessment and Provision:  Detailed education and instructions provided on heart failure disease management including the following:  Signs and symptoms of Heart Failure When to call the physician Importance of daily weights Low sodium diet Fluid restriction Medication management Anticipated future follow-up appointments  Patient education given on each of the above topics.  Patient acknowledges understanding via teach back method and acceptance of all instructions.  Education Materials:  Living Better With Heart Failure Booklet, HF zone tool, & Daily Weight Tracker Tool.  Patient has scale at home: No, have her daughter buy one.  Patient has pill box at home: Yes    High Risk Criteria for Readmission and/or Poor Patient Outcomes: Heart failure hospital admissions (last 6 months): 1  No Show rate: 3% Difficult social situation: Live with her daughter and son in law Demonstrates medication adherence:  yes Primary Language: English Literacy level: Reading, writing, and comprehension  Barriers of Care:   Diet/ fluid restrictions ( soda/ water) Daily weights  Considerations/Referrals:   Referral made to Heart Failure Pharmacist Stewardship: yes Referral made to Heart Failure CSW/NCM TOC: No Referral made to Heart & Vascular TOC clinic: Yes, 12/15/2023 @ 10 am  Items for Follow-up on DC/TOC: Continued HF education Diet/ fluid restrictions ( soda, Mt. Dew) daily weights   Stephane Haddock, BSN, RN Heart Failure Print Production Planner Chat Only

## 2023-12-05 NOTE — Progress Notes (Signed)
 Heart Failure Stewardship Pharmacist Progress Note   PCP: Lonnie Earnest, MD PCP-Cardiologist: Vina Gull, MD    HPI:  78 yo F with PMH of CHF, NICM, aortic atherosclerosis, COPD, GERD, prediabetes, and pulmonary fibrosis.   Presented to the ED on 1/31 with shortness of breath, orthopnea/PND, dizziness, and LE edema for 2 weeks. In the ED, was hypertensive, tachycardic, and noted to have 2+ pitting edema. BNP 495 and troponins flat. CXR with cardiomegaly without congestive failure. CTA negative for PE. Given 1 dose of IV lasix  40 mg. Was also noted to be in afib with episodes of RVR. Cardiology consulted. ECHO 2/1 showed LVEF reduced to 20-25% (was 50-55% 01/2023), global hypokinesis, RV severely reduced, mildly elevated PA pressure.   S/p TEE/DCCV on 2/3. LVEF 19%. Unfortunately, has been in and out of afib since. EP now consulted for further options.   Denies shortness of breath. LE has improved - no edema on exam. Denies lightheadedness or dizziness. Reviewed deductible and copay information with the patient - she reports she is on limited income and would have difficulty affording this. [employer] grant approved for Entresto  and Farxiga . Provided information to the patient and patient advocate will contact the pharmacy with the information as well. She states her kids usually travel to the pharmacy to pick up her medications for her.   Current HF Medications: Beta Blocker: carvedilol  6.25 mg BID ACE/ARB/ARNI: Entresto  24/26 mg BID MRA: spironolactone  12.5 mg daily  Prior to admission HF Medications: Diuretic: furosemide  20 mg daily MWF Beta blocker: carvedilol  3.125 mg BID MRA: spironolactone  12.5 mg daily  Pertinent Lab Values: Serum creatinine 0.77, BUN 8, Potassium 4.5, Sodium 137, BNP 495.9, Magnesium  2.2, A1c 6.0   Vital Signs: Weight: 159 lbs (admission weight: 169 lbs) Blood pressure: 100-120/60s  Heart rate: 60-130s - in and out of afib I/O: net -0.8L yesterday; net  -2.8L since admission  Medication Assistance / Insurance Benefits Check: Does the patient have prescription insurance?  Yes Type of insurance plan: UHC Medicare  Does the patient qualify for medication assistance through manufacturers or grants?   Yes Eligible grants and/or patient assistance programs: may be eligible for healthwell grant Medication assistance applications in progress: healthwell grant  Medication assistance applications approved: none Approved medication assistance renewals will be completed by: pending  Outpatient Pharmacy:  Prior to admission [facility] Is the patient willing to use [facility] TOC pharmacy at discharge? Yes Is the patient willing to transition their outpatient pharmacy to utilize a [facility] outpatient pharmacy?   No    Assessment: 1. Acute on chronic systolic CHF (LVEF 20-25%), due to NICM. NYHA class II symptoms. - Does not appear volume overloaded on exam. Strict I/Os and daily weights.  - Continue carvedilol  6.25 mg BID. Back in and out of afib. When in sinus, rates in the 50-60s. EP consulted.  - Continue Entresto  24/26 mg BID - Continue spironolactone  12.5 mg daily - Consider starting Farxiga  10 mg daily   Plan: 1) Medication changes recommended at this time: - Start Farxiga  10 mg daily  2) Patient assistance: - Has $266 deductible left on her insurance plan - Entresto  copay after deductible met $47 Farxiga /Jardiance copay after deductible met $47 - Patient reports she is unable to afford her medications. [employer] grant approved and will cover the cost of her Entresto  and Farxiga . Information provided to the patient and will be given to the pharmacy when scripts are received.   3)  Education  - Initial  education complete - Full education to be completed prior to discharge  Duwaine Plant, PharmD, BCPS Heart Failure Stewardship Pharmacist Phone [phone]

## 2023-12-05 NOTE — Progress Notes (Signed)
 Patient Name: Donna Herrera Date of Encounter: 12/05/2023 [facility] Cardiologist: Vina Gull, MD   Interval Summary  .    Feeling OK.  Didn't notice when she was in atrial fibrillation.  Denies lightheadedness or dizziness.   Vital Signs .    Vitals:   12/04/23 2354 12/05/23 0325 12/05/23 0605 12/05/23 0715  BP: 118/77 126/67 102/67 98/63  Pulse: 60 63 (!) 132 (!) 136  Resp: 18 17 16 17   Temp: 98.9 F (37.2 C) 98.7 F (37.1 C) 98.4 F (36.9 C) 98.7 F (37.1 C)  TempSrc: Oral Oral Oral Oral  SpO2: 96% 94% 95% 94%  Weight:  72.5 kg    Height:        Intake/Output Summary (Last 24 hours) at 12/05/2023 0933 Last data filed at 12/05/2023 0800 Gross per 24 hour  Intake 1060 ml  Output 1500 ml  Net -440 ml      12/05/2023    3:25 AM 12/04/2023    4:10 AM 12/03/2023    4:27 AM  Last 3 Weights  Weight (lbs) 159 lb 13.3 oz 162 lb 0.6 oz 161 lb 13.1 oz  Weight (kg) 72.5 kg 73.5 kg 73.4 kg      Telemetry/ECG    Atrial fibrillation and sinus rhythm/sinus bradycardia with PACs.   Personally Reviewed  Echo 12/02/23:  1. No evidence of LV thrombus. Left ventricular ejection fraction, by  estimation, is 20 to 25%. The left ventricle has severely decreased  function. The left ventricle demonstrates global hypokinesis. The left  ventricular internal cavity size was  moderately dilated. Left ventricular diastolic parameters are  indeterminate. Elevated left ventricular end-diastolic pressure.   2. Right ventricular systolic function is severely reduced. The right  ventricular size is normal. There is mildly elevated pulmonary artery  systolic pressure. The estimated right ventricular systolic pressure is  40.0 mmHg.   3. Left atrial size was moderately dilated.   4. Right atrial size was moderately dilated.   5. The mitral valve is normal in structure. Trivial mitral valve  regurgitation. No evidence of mitral stenosis.   6. The tricuspid valve is abnormal.  Tricuspid valve regurgitation is  moderate.   7. The aortic valve was not well visualized. Aortic valve regurgitation  is not visualized. No aortic stenosis is present.   8. Pulmonic valve regurgitation not assessed.   9. The inferior vena cava is normal in size with greater than 50%  respiratory variability, suggesting right atrial pressure of 3 mmHg.     Physical Exam .    VS:  BP 98/63 (BP Location: Right Arm)   Pulse (!) 136   Temp 98.7 F (37.1 C) (Oral)   Resp 17   Ht 5' 6 (1.676 m)   Wt 72.5 kg   SpO2 94%   BMI 25.80 kg/m  , BMI Body mass index is 25.8 kg/m. GENERAL:  Well appearing HEENT: Pupils equal round and reactive, fundi not visualized, oral mucosa unremarkable NECK:  No jugular venous distention, waveform within normal limits, carotid upstroke brisk and symmetric, no bruits, no thyromegaly LUNGS:  Clear to auscultation bilaterally HEART: RRR.  PMI not displaced or sustained,S1 and S2 within normal limits, no S3, no S4, no clicks, no rubs, no murmurs ABD:  Flat, positive bowel sounds normal in frequency in pitch, no bruits, no rebound, no guarding, no midline pulsatile mass, no hepatomegaly, no splenomegaly EXT:  2 plus pulses throughout, no edema, no cyanosis no clubbing SKIN:  No rashes  no nodules NEURO:  Cranial nerves II through XII grossly intact, motor grossly intact throughout PSYCH:  Cognitively intact, oriented to person place and time   Assessment & Plan .     37F admitted with new onset acute systolic heart failure and new atrial fibrillation.  # Acute systolic and diastolic HF: # Hypotension: LVEF this admission 20-25%.  This is new.  She now appears to be euvolemic.  Given her reduced systolic function and new onset atrial fibrillation she underwent TEE/DCCV.   Initially successful but transitioned back to sinus rhythm.  This AM she is hypotensive.  Last night we switched hydralazine  to Entresto .  Holding carvedilol  2/2 hypotension and bradycardia  in sinus rhythm.  Holding all antihypertensives 2/2 hypotension.  Giving 500 mL NS bolus.   Will also start SGLT2 inhibitor prior to discharge.   She will need an echocardiogram in 3 months.  If systolic function remains reduced to recommend ischemic evaluation.  # Persistent atrial fibrillation:  Continue Eliquis  and carvedilol .  Recurrent atrial fibrillation after DCCV yesterday.  Holding carvedilol  as above.  Given her underlying ILD, she isn't a good candidate for amiodarone.  Will ask EP to see her and consider dofetilide .  Continue Eliquis .   # Hyperlipidemia: Statin intolerant.  Continue Zetia .   For questions or updates, please contact [facility] Please consult [email or web address] for contact info under        Signed, Annabella Scarce, MD

## 2023-12-05 NOTE — Telephone Encounter (Signed)
 Heart Failure Patient Advocate Encounter  The patient was approved for a [employer] grant that will help cover the cost of Carvedilol , Entresto , Farxiga /Jardiance, Spironolactone . Total amount awarded, $10,000.  Effective: 11/05/2023 - 11/03/2024.  BIN [id number] PCN [id number] Group [id number] ID [id number]  Pharmacy provided with approval and processing information. Patient informed via inpatient stay.  Rachel DEL, CPhT Rx Patient Advocate Phone: [phone]

## 2023-12-05 NOTE — Plan of Care (Signed)
 FMTS Interim Progress Note  S: Went to evaluate patient for tachycardia w/ Dr. Rollene. Pt resting comfortably in bed playing solitaire on her iPad.  She denies any chest pain, shortness of breath at this time.  She reports feeling fine.  O: BP 102/67 (BP Location: Right Arm)   Pulse (!) 132   Temp 98.4 F (36.9 C) (Oral)   Resp 16   Ht 5' 6 (1.676 m)   Wt 72.5 kg   SpO2 95%   BMI 25.80 kg/m   Gen: Nontoxic-appearing woman laying comfortably in bed.  Speaking comfortably in full sentences on room air.  NAD. CV: Rapid rate. Radial pulse 1+. Extremitiy warm and well perfused.  Resp: Normal WOB on RA  A/P:  Afib RVR Consulted Cardiology PA Trudy, Cardiology/EP team will be by soon to evaluate pt. Did not recommend adding an agent at this time due to her being asymptomatic and having episodes of bradycardia when in sinus. Otherwise vitals remain stable and maintaining MAPs - f/u Cards and EP recs   Donna Hamlet, MD 12/05/2023, 6:20 AM PGY-2, [facility] Service pager [phone]

## 2023-12-05 NOTE — Plan of Care (Signed)
 FMTS Brief Progress Note  S: Patient seen at bedside with Dr. Zheng for night rounds. Patient reports doing well, no current complaints.    O: BP (!) 107/54 (BP Location: Right Arm)   Pulse 62   Temp 98.6 F (37 C) (Oral)   Resp 18   Ht 5' 6 (1.676 m)   Wt 72.5 kg   SpO2 97%   BMI 25.80 kg/m   General: A&O, NAD HEENT: No sign of trauma, EOM grossly intact Cardiac: normal S1 and S2, no m/r/g Respiratory: NWOB on RA    A/P: A fib with RVR Patient is currently in NSR with rates in 60s. No complaints at this time. - continue plan per day team - Orders reviewed. Labs for AM ordered, which was adjusted as needed.  - If condition changes, plan includes reevaluation and adjustment as needed.   Lonnie Earnest, MD 12/05/2023, 8:23 PM PGY-1, [facility] Night Resident  Please page [phone] with questions.

## 2023-12-06 ENCOUNTER — Other Ambulatory Visit (HOSPITAL_COMMUNITY): Payer: Self-pay

## 2023-12-06 ENCOUNTER — Inpatient Hospital Stay (HOSPITAL_COMMUNITY): Payer: Medicare Other

## 2023-12-06 DIAGNOSIS — I509 Heart failure, unspecified: Secondary | ICD-10-CM | POA: Diagnosis not present

## 2023-12-06 DIAGNOSIS — I959 Hypotension, unspecified: Secondary | ICD-10-CM | POA: Diagnosis not present

## 2023-12-06 DIAGNOSIS — I4891 Unspecified atrial fibrillation: Secondary | ICD-10-CM | POA: Diagnosis not present

## 2023-12-06 DIAGNOSIS — I5041 Acute combined systolic (congestive) and diastolic (congestive) heart failure: Secondary | ICD-10-CM | POA: Diagnosis not present

## 2023-12-06 LAB — MAGNESIUM: Magnesium: 2.2 mg/dL (ref 1.7–2.4)

## 2023-12-06 LAB — BASIC METABOLIC PANEL
Anion gap: 10 (ref 5–15)
Anion gap: 7 (ref 5–15)
BUN: 9 mg/dL (ref 8–23)
BUN: 9 mg/dL (ref 8–23)
CO2: 25 mmol/L (ref 22–32)
CO2: 26 mmol/L (ref 22–32)
Calcium: 8.8 mg/dL — ABNORMAL LOW (ref 8.9–10.3)
Calcium: 8.9 mg/dL (ref 8.9–10.3)
Chloride: 101 mmol/L (ref 98–111)
Chloride: 99 mmol/L (ref 98–111)
Creatinine, Ser: 0.82 mg/dL (ref 0.44–1.00)
Creatinine, Ser: 0.84 mg/dL (ref 0.44–1.00)
GFR, Estimated: >60 mL/min (ref >60)
GFR, Estimated: >60 mL/min (ref >60)
Glucose, Bld: 124 mg/dL — ABNORMAL HIGH (ref 70–99)
Glucose, Bld: 94 mg/dL (ref 70–99)
Potassium: 4.7 mmol/L (ref 3.5–5.1)
Potassium: 4.9 mmol/L (ref 3.5–5.1)
Sodium: 134 mmol/L — ABNORMAL LOW (ref 135–145)
Sodium: 134 mmol/L — ABNORMAL LOW (ref 135–145)

## 2023-12-06 LAB — CBC
HCT: 36.5 % (ref 36.0–46.0)
Hemoglobin: 11.5 g/dL — ABNORMAL LOW (ref 12.0–15.0)
MCH: 28 pg (ref 26.0–34.0)
MCHC: 31.5 g/dL (ref 30.0–36.0)
MCV: 89 fL (ref 80.0–100.0)
Platelets: 263 10*3/uL (ref 150–400)
RBC: 4.1 MIL/uL (ref 3.87–5.11)
RDW: 15.5 % (ref 11.5–15.5)
WBC: 5.3 10*3/uL (ref 4.0–10.5)
nRBC: 0 % (ref 0.0–0.2)

## 2023-12-06 LAB — URIC ACID: Uric Acid, Serum: 5 mg/dL (ref 2.5–7.1)

## 2023-12-06 MED ORDER — DOFETILIDE 125 MCG PO CAPS
125.0000 ug | ORAL_CAPSULE | Freq: Two times a day (BID) | ORAL | Status: DC
  Administered 2023-12-06: 125 ug via ORAL
  Filled 2023-12-06 (×2): qty 1

## 2023-12-06 MED ORDER — DOFETILIDE 250 MCG PO CAPS
250.0000 ug | ORAL_CAPSULE | Freq: Two times a day (BID) | ORAL | Status: DC
  Administered 2023-12-06: 250 ug via ORAL
  Filled 2023-12-06: qty 1

## 2023-12-06 MED ORDER — DAPAGLIFLOZIN PROPANEDIOL 10 MG PO TABS
10.0000 mg | ORAL_TABLET | Freq: Every day | ORAL | Status: DC
  Administered 2023-12-06 – 2023-12-08 (×3): 10 mg via ORAL
  Filled 2023-12-06 (×3): qty 1

## 2023-12-06 MED ORDER — COLCHICINE 0.6 MG PO TABS
0.6000 mg | ORAL_TABLET | Freq: Every day | ORAL | Status: DC
  Administered 2023-12-06 – 2023-12-08 (×3): 0.6 mg via ORAL
  Filled 2023-12-06 (×3): qty 1

## 2023-12-06 MED ORDER — DOFETILIDE 250 MCG PO CAPS: 250.0000 ug | ORAL_CAPSULE | Freq: Two times a day (BID) | ORAL | Status: DC

## 2023-12-06 NOTE — Plan of Care (Signed)
 FMTS Interim Progress Note  A/P: Spoke to On-call Cardiology fellow regarding pt's prolonged QT after tikosyn . QT 2 hours after tikosyn  given went from 442 to 510. He recommends holding AM dose of tikosyn  and checking an EKG prior. If QT remains prolonged, then we should contact EP to see if tikosyn  should be held or dose adjusted.    Elicia Hamlet, MD 12/06/2023, 4:23 AM PGY-2, [facility] Family Medicine Service pager [phone]

## 2023-12-06 NOTE — Progress Notes (Addendum)
 Morning EKG reviewed     Shows remains in NSR with borderline QTc at 480-500 ms when measured manually and U wave is disregarded.   Discussed with Dr. Cindie   DECREASE Tikosyn  to 125 mcg BID and give dose tonight as scheduled.   Potassium4.7 (02/05 0659) Magnesium   2.2 (02/05 0000) Creatinine, ser  0.82 (02/05 0659)  If QT remains prolonged may need to consider amiodarone trial, as would not have other AAD options given CHF.   Donna Prentice Passey, PA-C  12/06/2023 12:10 PM

## 2023-12-06 NOTE — Progress Notes (Addendum)
 Daily Progress Note Intern Pager: [phone]  Patient name: Donna Herrera Medical record number: [id number] Date of birth: Sep 11, 1946 Age: 78 y.o. Gender: female  Primary Care Provider: Lonnie Earnest, MD Consultants: Cardiology, EP  Code Status: Full Code  Pt Overview and Major Events to Date:  1/31: Admitted 2/3: TEE/Cardioversion > converted to NSR > PAF > spontaneous NSR  Tikosyn   Assessment and Plan: Donna Herrera is 78 yo female w/ past medical history of aortic atherosclerosis, cervical DDD, DJD, GERD, CHF, Vit D deficiency, prediabetes, OA, pulmonary fibrosis, high risk fall presenting with new onset A-fib.  Transitioned to Elqiuis, however there is an ongoing discussion at this time to determine the most cost-effective/affordable AC option for her and her family.  S/p TEE/cardioversion which was successful.  Has had multiple episodes of paroxysmal A-fib.  Spontaneously converted back to NSR while ambulating yesterday and has stayed in NSR since.   Overnight her K (6.0) was elevated and discussed with cardiology fellow. She received Lokelma  5 mg to help reduce her K and night team was advised to continue the Tikosyn . However, her QTc was prolonged to 504 after receiving it, then improved to 487 2 hours after, but remains elevated. Discussed with EP this morning regarding morning Tikosyn  dose and was advised to continue as scheduled BID.   Pharmacy to discuss [facility] options with family regarding cost of medications.  [employer] grant approved and will cover the cost of her Entresto  and Farxiga .  Assessment & Plan New onset a-fib [facility]) S/p TEE/Cardioversion which was successful, but has had PAF, mostly rate controlled with episodes of RVR.  Transitioned from heparin  to Eliquis .  TEE demonstrated EF <20%, LV with severely decreased function, LV global hypokinesis, RA severely dilated, moderate MVR, mild-mod TVR, trivial AVR, and mild G2 layered plaque involving the  descending aorta.  Coreg  is currently still being held with her HR being in the low 60s or less.  Cardiology started Tikosyn  250 mcg BID since she isn't a good candidate for amio with her ILD. - Cardiology and EP consulted, appreciate recommendations - Holding Coreg  in setting of hypotension, MAPs have stabilized, restart as able - Tikosyn  decreased to 125 mcg BID, after receiving 250 mg AM dose - Replete electrolyte goals: K>4 and Mag>2 - Continue Eliquis  for now - Pending family discussion with pharmacy regarding AC and ability to afford Eliquis  vs Xarelto, if unable to cover cost will consider Lovenox 1 mg/kg BID this PM and then transition to warfarin per pharmacy. Seems as if family is amenable to helping with costs Acute systolic heart failure (HCC) CTPE negative for PE. TEE demonstrated EF <20%, LV with severely decreased function, LV global hypokinesis, RA severely dilated, moderate MVR, mild-mod TVR, trivial AVR, and mild G2 layered plaque involving the descending aorta.  Possible tachycardia mediated cardiomyopathy per EP.  Given 1 dose of 40 mg IV Lasix  2/1; euvolemic on exam today.  Cardiology would like to start Entresto  24-26 mg BID, Farxiga  10 mg daily and continue Coreg  6.25 mg BID, but discontinued Hydralazine .  [employer] grant approved and will cover the cost of Entresto  and Farxiga .  BP stabilized this AM. - Holding Entresto  24-26 mg BID, Coreg  6.25 mg BID, Spironolactone  12.5 daily - Cardiology started Farxiga  10 mg daily - Restart GDMT as able, BP permitting - Daily weights  - Strict I/O - Monitor volume status, diurese as appropriate - AM BMP and Mag - Repeat echo in 3 months, if systolic fxn remains reduced may require  ischemic eval; will address in DC summary Normocytic anemia New onset over the past 6 months, no sources of bleeding. Hgb stable 10.3.  Last colonoscopy in 2021, no further recommended screening per GI.  Iron studies with slightly low sat ratio. - Ferrous  sulfate 325 mg every other day Essential hypertension BP in the 100-120s/50-80s. On Coreg  BID, Entresto  BID, and Spironolactone  daily, will hold for now. BPs and MAPs stabilized this AM. - Monitor VS - Restart medications as able, BP permitting Right hip pain S/p R total hip replacement. Has a hx of SCFE and DJD. Complains of R hip pain to light palpation. Imaging revealed old fusion of L hip, R hip arthroplasty with acetabular protrusion & superior subluxation & bone resorption around the proximal femur. Follows orthopedics outpatient. R hip XR with no acute changes. - Pain regimen: Lidocaine  patch q24h. Tylenol  for mild pain and Oxycodone  2.5-5 mg for moderate/severe pain, K pad as tolerated, consider Voltaren  gel  - PT/OT - OOB as tolerated  AKI (acute kidney injury) (HCC) Cr bumped from 0.8 > 1.10. Has improved since, Cr 0.82 today.  - Encourage PO ad lib - AM BMP Bilateral leg pain Chronic, likely in the setting of dependent edema. DVT U/S negative for acute DVT. Bilateral foot pain R>L. TTP palpation and with walking. Concern for gout with hx, however uric acid 5.0. - L/R foot XR - Colchicine  0.6 mg daily - Continue to monitor  Chronic and Stable Problems:  COPD: Albuterol  prn  Gout: Continue home allopurinol . Chronic Right Hip Pain: Takes Oxycodone  5 mg q6h PRN at home. Added on Oxycodone  2.5-5mg  q6h PRN for moderate/severe pain and Lidocaine  patch q24h. Anxiety/Depression: Continue Cymbalta  30 mg daily Vitamin D  deficiency: Contine D3 5000u daily HLD: Continue Zetia  10 mg daily.  Lipid panel repeated 4 weeks ago demonstrated LDL 138. GERD: Continue Famotidine  20 mg daily PRN Neuropathy: Continue Gabapentin  300 mg nightly Prediabetes: A1c 4 weeks ago 6.0.  Bilateral leg pain: Chronic, likely in the setting of dependent edema. DVT U/S negative for acute DVT.   FEN/GI: Heart healthy  PPx: Eliquis   Dispo:Home pending AC discussion/stability  Subjective:  Patient is doing well  this morning. Denies any chest pain or SOB. She reports discussing medication cost assistance with her family who are amenable to helping.   Objective: Temp:  [97.8 F (36.6 C)-98.6 F (37 C)] 97.8 F (36.6 C) (02/05 0712) Pulse Rate:  [61-62] 62 (02/05 0712) Resp:  [15-18] 15 (02/05 0712) BP: (105-143)/(54-80) 122/60 (02/05 0712) SpO2:  [94 %-97 %] 94 % (02/05 0712) Weight:  [73.9 kg] 73.9 kg (02/05 0615) Physical Exam: General: Resting comfortably in bed in NAD.  Cardiovascular: RRR. No M/R/G  Respiratory: CTAB. Normal WOB on RA. Abdomen: Soft, non-tender, non-distended. Normoactive bowel sounds.  Extremities: No BLE edema.   Laboratory: Most recent CBC Lab Results  Component Value Date   WBC 5.3 12/06/2023   HGB 11.5 (L) 12/06/2023   HCT 36.5 12/06/2023   MCV 89.0 12/06/2023   PLT 263 12/06/2023   Most recent BMP    Latest Ref Rng & Units 12/06/2023    6:59 AM  BMP  Glucose 70 - 99 mg/dL 94   BUN 8 - 23 mg/dL 9   Creatinine 9.55 - 8.99 mg/dL 9.17   Sodium 864 - 854 mmol/L 134   Potassium 3.5 - 5.1 mmol/L 4.7   Chloride 98 - 111 mmol/L 99   CO2 22 - 32 mmol/L 25   Calcium  8.9 -  10.3 mg/dL 8.9    Imaging/Diagnostic Tests: No new imaging.  Janna Ferrier, DO 12/06/2023, 1:39 PM  PGY-1, [facility] Family Medicine FPTS Intern pager: [phone], text pages welcome Secure chat group [facility] Teaching Service

## 2023-12-06 NOTE — TOC Progression Note (Signed)
 Transition of Care [facility]) - Progression Note    Patient Details  Name: Donna Herrera MRN: [id number] Date of Birth: 1946-03-25  Transition of Care [facility]) CM/SW Contact  Arlana JINNY Nicholaus ISRAEL Phone Number: [phone] 12/06/2023, 4:15 PM  Clinical Narrative:   HF CSW was contacted by [facility] to assist pt with completing   4:02 PM- CSW called pts daughter, Porter. Pts daughter stated that she does not drive and that it was not much she could do. Porter provided CSW with her sister, Montie Boers [phone].   4:05 PM- CSW called pts daughter Montie Boers. Who stated that she is not the pts daughters, she is her god daughter. Montie stated that she may be able to visit pt tomorrow and assist with completing forms after 4pm.   HF CSW will work with the HF NCM to get the forms completed on 2/6.   TOC will continue following.       Expected Discharge Plan and Services                                               Social Determinants of Health (SDOH) Interventions SDOH Screenings   Food Insecurity: No Food Insecurity (12/03/2023)  Housing: Low Risk  (12/05/2023)  Transportation Needs: No Transportation Needs (12/05/2023)  Utilities: Not At Risk (12/03/2023)  Alcohol Screen: Low Risk  (12/05/2023)  Depression (PHQ2-9): High Risk (12/01/2023)  Financial Resource Strain: Low Risk  (12/05/2023)  Physical Activity: Insufficiently Active (04/28/2023)  Social Connections: Moderately Isolated (12/03/2023)  Stress: No Stress Concern Present (04/28/2023)  Tobacco Use: Medium Risk (12/04/2023)    Readmission Risk Interventions     No data to display

## 2023-12-06 NOTE — Progress Notes (Signed)
 Qtc prior to tikosyn  administration reported by CCMD as 492. Provided as ordered at 2100 (see MAR).

## 2023-12-06 NOTE — Assessment & Plan Note (Signed)
 Chronic, likely in the setting of dependent edema. DVT U/S negative for acute DVT. Bilateral foot pain R>L. TTP palpation and with walking. Concern for gout with hx, however uric acid 5.0. - L/R foot XR - Colchicine  0.6 mg daily - Continue to monitor

## 2023-12-06 NOTE — Assessment & Plan Note (Addendum)
 New onset over the past 6 months, no sources of bleeding. Hgb stable 10.3.  Last colonoscopy in 2021, no further recommended screening per GI. Iron studies with slightly low sat ratio. - Ferrous sulfate 325 mg every other day

## 2023-12-06 NOTE — Progress Notes (Signed)
 Patient Name: Donna Herrera Date of Encounter: 12/06/2023 [facility] Cardiologist: Vina Gull, MD   Interval Summary  .    Feeling OK.  Didn't notice when she was in atrial fibrillation.  Notes pain in her feet when walking similar to prior episodes of gout.   Vital Signs .    Vitals:   12/06/23 0032 12/06/23 0356 12/06/23 0615 12/06/23 0712  BP: 105/60 (!) 143/80  122/60  Pulse:  61  62  Resp: 18 17  15   Temp: 97.8 F (36.6 C) 97.9 F (36.6 C)  97.8 F (36.6 C)  TempSrc: Oral Oral  Oral  SpO2: 94% 97%  94%  Weight:   73.9 kg   Height:        Intake/Output Summary (Last 24 hours) at 12/06/2023 0953 Last data filed at 12/06/2023 0945 Gross per 24 hour  Intake 1174 ml  Output 2700 ml  Net -1526 ml      12/06/2023    6:15 AM 12/05/2023    3:25 AM 12/04/2023    4:10 AM  Last 3 Weights  Weight (lbs) 162 lb 14.7 oz 159 lb 13.3 oz 162 lb 0.6 oz  Weight (kg) 73.9 kg 72.5 kg 73.5 kg      Telemetry/ECG    Sinus rhythm/sinus arrhythmia.  Dropped PACs.   Personally Reviewed  Echo 12/02/23:  1. No evidence of LV thrombus. Left ventricular ejection fraction, by  estimation, is 20 to 25%. The left ventricle has severely decreased  function. The left ventricle demonstrates global hypokinesis. The left  ventricular internal cavity size was  moderately dilated. Left ventricular diastolic parameters are  indeterminate. Elevated left ventricular end-diastolic pressure.   2. Right ventricular systolic function is severely reduced. The right  ventricular size is normal. There is mildly elevated pulmonary artery  systolic pressure. The estimated right ventricular systolic pressure is  40.0 mmHg.   3. Left atrial size was moderately dilated.   4. Right atrial size was moderately dilated.   5. The mitral valve is normal in structure. Trivial mitral valve  regurgitation. No evidence of mitral stenosis.   6. The tricuspid valve is abnormal. Tricuspid valve regurgitation  is  moderate.   7. The aortic valve was not well visualized. Aortic valve regurgitation  is not visualized. No aortic stenosis is present.   8. Pulmonic valve regurgitation not assessed.   9. The inferior vena cava is normal in size with greater than 50%  respiratory variability, suggesting right atrial pressure of 3 mmHg.     Physical Exam .    VS:  BP 122/60 (BP Location: Right Arm)   Pulse 62   Temp 97.8 F (36.6 C) (Oral)   Resp 15   Ht 5' 6 (1.676 m)   Wt 73.9 kg   SpO2 94%   BMI 26.30 kg/m  , BMI Body mass index is 26.3 kg/m. GENERAL:  Well appearing HEENT: Pupils equal round and reactive, fundi not visualized, oral mucosa unremarkable NECK:  No jugular venous distention, waveform within normal limits, carotid upstroke brisk and symmetric, no bruits, no thyromegaly LUNGS:  Clear to auscultation bilaterally HEART: RRR.  PMI not displaced or sustained,S1 and S2 within normal limits, no S3, no S4, no clicks, no rubs, no murmurs ABD:  Flat, positive bowel sounds normal in frequency in pitch, no bruits, no rebound, no guarding, no midline pulsatile mass, no hepatomegaly, no splenomegaly EXT:  2 plus pulses throughout, no edema, no cyanosis no clubbing SKIN:  No rashes no nodules NEURO:  Cranial nerves II through XII grossly intact, motor grossly intact throughout PSYCH:  Cognitively intact, oriented to person place and time   Assessment & Plan .     21F admitted with new onset acute systolic heart failure and new atrial fibrillation.  # Acute systolic and diastolic HF: # Hypotension: LVEF this admission 20-25%.  This is new.  She now appears to be euvolemic.  Given her reduced systolic function and new onset atrial fibrillation she underwent TEE/DCCV.   Initially successful but transitioned back to sinus rhythm.  She became hypotensive after starting Entresto .  Now off antihypertensives.  HR too low for beta blockers.  Will also start SGLT2 inhibitor prior to discharge.    She will need an echocardiogram in 3 months.  If systolic function remains reduced to recommend ischemic evaluation.  # Persistent atrial fibrillation:  Continue Eliquis .  Recurrent atrial fibrillation after DCCV .  Holding carvedilol  as above.  Given her underlying ILD, she isn't a good candidate for amiodarone.  She has been seen by EP and starting dofetilide .  # Hyperlipidemia: Statin intolerant.  Continue Zetia .   # ?Gout: Check uric acid level.  For questions or updates, please contact [facility] Please consult [email or web address] for contact info under        Signed, Annabella Scarce, MD

## 2023-12-06 NOTE — Plan of Care (Signed)
   Problem: Education: Goal: Knowledge of General Education information will improve Description: Including pain rating scale, medication(s)/side effects and non-pharmacologic comfort measures Outcome: Progressing   Problem: Clinical Measurements: Goal: Respiratory complications will improve Outcome: Progressing   Problem: Pain Managment: Goal: General experience of comfort will improve and/or be controlled Outcome: Progressing

## 2023-12-06 NOTE — TOC Initial Note (Addendum)
 Transition of Care [facility]) - Initial/Assessment Note    Patient Details  Name: Donna Herrera MRN: [id number] Date of Birth: Mar 20, 1946  Transition of Care [facility]) CM/SW Contact:    Waddell Barnie Rama, RN Phone Number: 12/06/2023, 4:30 PM  Clinical Narrative:                 From home with daughter and SIL, has PCP and insurance on file, states has no HH services in place at this time has crutches at bedside.  States family member will transport them home at [employer] and family is support system, [facility] [address]  Pta self ambulatory with crutches. NCM offered choice for HH , she does not have a preference.  NCM made referral to [facility] with [facility] for HHPT, HHOT, he is able to take referral.  Soc will begin 24 to 48 hrs post dc. Copay for tikosyn  is 47.00.    Expected Discharge Plan: Home w Home Health Services Barriers to Discharge: Continued Medical Work up   Patient Goals and CMS Choice Patient states their goals for this hospitalization and ongoing recovery are:: return home CMS Medicare.gov Compare Post Acute Care list provided to:: Patient Choice offered to / list presented to : Patient      Expected Discharge Plan and Services In-house Referral: NA Discharge Planning Services: CM Consult Post Acute Care Choice: Home Health Living arrangements for the past 2 months: Single Family Home                 DME Arranged: N/A DME Agency: NA       HH Arranged: PT, OT HH Agency: [facility] Date [facility] Agency Contacted: 12/06/23 Time HH Agency Contacted: 1629 Representative spoke with at [facility] Agency: Darleene  Prior Living Arrangements/Services Living arrangements for the past 2 months: Single Family Home Lives with:: Adult Children (daughter and SIL) Patient language and need for interpreter reviewed:: Yes Do you feel safe going back to the place where you live?: Yes      Need for Family Participation in Patient Care: Yes  (Comment) Care giver support system in place?: Yes (comment) Current home services: DME (crutches) Criminal Activity/Legal Involvement Pertinent to Current Situation/Hospitalization: No - Comment as needed  Activities of Daily Living      Permission Sought/Granted Permission sought to share information with : Case Manager Permission granted to share information with : Yes, Verbal Permission Granted              Emotional Assessment Appearance:: Appears stated age Attitude/Demeanor/Rapport: Engaged Affect (typically observed): Appropriate Orientation: : Oriented to Self, Oriented to Place, Oriented to Situation, Oriented to  Time Alcohol / Substance Use: Not Applicable Psych Involvement: No (comment)  Admission diagnosis:  A-fib (HCC) [I48.91] New onset atrial fibrillation (HCC) [I48.91] New onset a-fib (HCC) [I48.91] Acute on chronic congestive heart failure, unspecified heart failure type (HCC) [I50.9] Patient Active Problem List   Diagnosis Date Noted   Hypotension due to hypovolemia 12/05/2023   AKI (acute kidney injury) (HCC) 12/04/2023   New onset a-fib (HCC) 12/02/2023   Right hip pain 12/02/2023   Bilateral leg pain 12/02/2023   Normocytic anemia 12/02/2023   Acute systolic heart failure (HCC) 12/01/2023   ILD (interstitial lung disease) (HCC) 09/19/2023   Hypertrophic toenail 04/04/2023   Hx of surgical fusion joint 12/23/2022   Mechanical failure of prosthetic joint, initial encounter (HCC) 12/23/2022   Osteoarthritis 10/26/2021   Urge incontinence 07/19/2021   Osteoarthritis  of shoulders, bilateral 07/19/2021   mild cognitive impairment 07/19/2021   Atherosclerosis of aorta (HCC) 07/15/2021   Hallux valgus (acquired), left foot 06/08/2021   COPD (chronic obstructive pulmonary disease) (HCC) 03/02/2021   Pulmonary fibrosis (HCC) 03/02/2021   Degenerative disc disease, cervical 11/26/2020   Rotator cuff arthropathy of both shoulders 11/26/2020   History of  tobacco abuse 03/26/2020   Prediabetes 03/26/2020   Essential hypertension 07/20/2017   NICM (nonischemic cardiomyopathy) (HCC) 06/09/2016   HFmrEF (Heart Failure with mildly reduced EF) 10/21/2015   Acute on chronic congestive heart failure (HCC)    Vitamin D  deficiency 12/31/2014   Hyperlipidemia 09/16/2013   DJD (degenerative joint disease) 10/12/2012   Encounter for chronic pain management 10/12/2012   Gout 10/12/2012   GERD (gastroesophageal reflux disease) 10/12/2012   Hypertensive heart disease with congestive heart failure (HCC) 10/12/2012   PCP:  Lonnie Earnest, MD Pharmacy:   [phone] [address] [place] - [address] [place] [address] Phone: [phone] Fax: [phone]  [facility] [address] [place] [address] Phone: [phone] Fax: [phone]     Social Drivers of Health (SDOH) Social History: SDOH Screenings   Food Insecurity: No Food Insecurity (12/03/2023)  Housing: Low Risk  (12/05/2023)  Transportation Needs: No Transportation Needs (12/05/2023)  Utilities: Not At Risk (12/03/2023)  Alcohol Screen: Low Risk  (12/05/2023)  Depression (PHQ2-9): High Risk (12/01/2023)  Financial Resource Strain: Low Risk  (12/05/2023)  Physical Activity: Insufficiently Active (04/28/2023)  Social Connections: Moderately Isolated (12/03/2023)  Stress: No Stress Concern Present (04/28/2023)  Tobacco Use: Medium Risk (12/04/2023)   SDOH Interventions: Housing Interventions: Intervention Not Indicated Alcohol Usage Interventions: Intervention Not Indicated (Score <7) Financial Strain Interventions: Intervention Not Indicated   Readmission Risk Interventions     No data to display

## 2023-12-06 NOTE — Progress Notes (Addendum)
 Physical Therapy Treatment Patient Details Name: Donna Herrera MRN: [id number] DOB: 1946-06-04 Today's Date: 12/06/2023   History of Present Illness Pt is a 78 y.o. female who presented 12/01/23 with worsening dyspnea x2 weeks and R hip pain. Imaging negative for PE. EKG demonstrated new onset atrial fibrillation. Imaging also showed previous R hip arthroplasty with acetabular protrusion and superior subluxation as well as bone resorption around the proximal femur. S/p successful cardioversion 2/3. PMH: atherosclerosis of aorta, CHF, depression, DJD, dyshidrotic eczema, HTN, GERD, gout, HLD, HTN, pulmonary fibrosis, pre-diabetes, L breast mass, pulmonary nodule    PT Comments  The pt continues to require light minA for bed mobility and for the EOB to be elevated greatly to transfer sit <> stand due to chronic lower extremity pain and ROM deficits. She continues to be able to ambulate smoothly without LOB while utilizing her crutches, but she appears to rely heavily on leaning on the crutches through her shoulders, placing her at risk for shoulder injuries. Educated pt on proper use of crutches by pushing more through her hands on the grips rather than the shoulders, min success in correcting noted. Will continue to follow acutely.  Of note, pt is now reporting bil feet pain, across dorsal distal aspect at her L foot and along her lateral aspect of her R foot. She said the pain began to worsen x2 days ago, but started last Friday. There was some redness noted at her R foot today. She reports a hx of gout, and does agree this may feel like gout. She says she takes allopurinol  daily and colchicine  during gout flare ups. Notified MD of this info.     If plan is discharge home, recommend the following: A little help with bathing/dressing/bathroom;Assistance with cooking/housework;Assist for transportation;Help with stairs or ramp for entrance   Can travel by private vehicle        Equipment  Recommendations  None recommended by PT    Recommendations for Other Services       Precautions / Restrictions Precautions Precautions: Fall Restrictions Weight Bearing Restrictions Per Provider Order: No     Mobility  Bed Mobility Overal bed mobility: Needs Assistance Bed Mobility: Supine to Sit, Sit to Supine     Supine to sit: Min assist, HOB elevated, Used rails Sit to supine: Min assist, HOB elevated, Used rails   General bed mobility comments: Pt required increased time for all bed mobility, requesting R HHA to pull her trunk up to sit L EOB and needing minA to lift her legs back onto bed with return to supine    Transfers Overall transfer level: Needs assistance Equipment used: Crutches Transfers: Sit to/from Stand Sit to Stand: Contact guard assist, From elevated surface           General transfer comment: CGA for safety with sit <> stand from elevated EOB (pt does not sit in low surfaces at home).    Ambulation/Gait Ambulation/Gait assistance: Contact guard assist Gait Distance (Feet): 260 Feet Assistive device: Crutches Gait Pattern/deviations: Step-to pattern, Decreased stride length, Decreased dorsiflexion - right, Decreased dorsiflexion - left, Trunk flexed Gait velocity: reduced Gait velocity interpretation: <1.31 ft/sec, indicative of household ambulator   General Gait Details: Pt ambulates slowly with her crutches, intermittently moving her crutches simulatenously and other times moving 1 at a time then stepping each foot 1 at a time before moving crutches again. No LOB, CGA for safety. Educated pt on pushing through her hands on the crutches grip more than leaning forward  through her shoulders on the crutches to reduce risk of shoulder injury and to improve upright posture, min success noted.   Stairs             Wheelchair Mobility     Tilt Bed    Modified Rankin (Stroke Patients Only)       Balance Overall balance assessment:  Needs assistance Sitting-balance support: No upper extremity supported, Feet supported Sitting balance-Leahy Scale: Good     Standing balance support: Bilateral upper extremity supported, No upper extremity supported, During functional activity Standing balance-Leahy Scale: Fair Standing balance comment: statically standing at sink without UE support, no LOB; reliant on crutches to ambulate                            Cognition Arousal: Alert Behavior During Therapy: WFL for tasks assessed/performed Overall Cognitive Status: Within Functional Limits for tasks assessed                                          Exercises      General Comments        Pertinent Vitals/Pain Pain Assessment Pain Assessment: Faces Faces Pain Scale: Hurts little more Pain Location: R hip, L dorsal distal foot, R lateral foot Pain Descriptors / Indicators: Aching, Sharp Pain Intervention(s): Limited activity within patient's tolerance, Monitored during session, Repositioned, Other (comment) (notified MD of pain at feet and redness noted with pt reporting hx of gout and compliance with meds to manage gout at home)    Home Living                          Prior Function            PT Goals (current goals can now be found in the care plan section) Acute Rehab PT Goals Patient Stated Goal: to reduce pain PT Goal Formulation: With patient Time For Goal Achievement: 12/16/23 Potential to Achieve Goals: Good Progress towards PT goals: Progressing toward goals    Frequency    Min 1X/week      PT Plan      Co-evaluation              AM-PAC PT 6 Clicks Mobility   Outcome Measure  Help needed turning from your back to your side while in a flat bed without using bedrails?: A Little Help needed moving from lying on your back to sitting on the side of a flat bed without using bedrails?: A Little Help needed moving to and from a bed to a chair  (including a wheelchair)?: A Little Help needed standing up from a chair using your arms (e.g., wheelchair or bedside chair)?: A Little Help needed to walk in hospital room?: A Little Help needed climbing 3-5 steps with a railing? : A Lot 6 Click Score: 17    End of Session Equipment Utilized During Treatment: Gait belt Activity Tolerance: Patient tolerated treatment well Patient left: in bed;with call bell/phone within reach;with bed alarm set   PT Visit Diagnosis: Unsteadiness on feet (R26.81);Other abnormalities of gait and mobility (R26.89);Muscle weakness (generalized) (M62.81);Difficulty in walking, not elsewhere classified (R26.2);Pain Pain - Right/Left: Right Pain - part of body: Hip     Time: 8961-8887 PT Time Calculation (min) (ACUTE ONLY): 34 min  Charges:    $Gait Training:  8-22 mins $Therapeutic Activity: 8-22 mins PT General Charges $$ ACUTE PT VISIT: 1 Visit                     Theo Ferretti, PT, DPT Acute Rehabilitation Services  Office: [phone]    Theo CHRISTELLA Ferretti 12/06/2023, 2:23 PM

## 2023-12-06 NOTE — Assessment & Plan Note (Addendum)
 Cr bumped from 0.8 > 1.10. Has improved since, Cr 0.82 today.  - Encourage PO ad lib - AM BMP

## 2023-12-06 NOTE — Progress Notes (Signed)
   Heart Failure Stewardship Pharmacist Progress Note   PCP: Lonnie Earnest, MD PCP-Cardiologist: Vina Gull, MD    HPI:  78 yo F with PMH of CHF, NICM, aortic atherosclerosis, COPD, GERD, prediabetes, and pulmonary fibrosis.   Presented to the ED on 1/31 with shortness of breath, orthopnea/PND, dizziness, and LE edema for 2 weeks. In the ED, was hypertensive, tachycardic, and noted to have 2+ pitting edema. BNP 495 and troponins flat. CXR with cardiomegaly without congestive failure. CTA negative for PE. Given 1 dose of IV lasix  40 mg. Was also noted to be in afib with episodes of RVR. Cardiology consulted. ECHO 2/1 showed LVEF reduced to 20-25% (was 50-55% 01/2023), global hypokinesis, RV severely reduced, mildly elevated PA pressure.   S/p TEE/DCCV on 2/3. LVEF 19%. Unfortunately, has been in and out of afib since. EP now consulted for further options.   Denies shortness of breath. No LEE. Denies lightheadedness or dizziness. States that she has new pain in her feet and legs - thinks this is gout related. States she informed the doctor this morning.   Current HF Medications: None  Prior to admission HF Medications: Diuretic: furosemide  20 mg daily MWF Beta blocker: carvedilol  3.125 mg BID MRA: spironolactone  12.5 mg daily  Pertinent Lab Values: Serum creatinine 0.82, BUN 9, Potassium 4.7, Sodium 134, BNP 495.9, Magnesium  2.2, A1c 6.0   Vital Signs: Weight: 162 lbs (admission weight: 169 lbs) Blood pressure: 100-140/60s  Heart rate: 50-60s I/O: net -0.7L yesterday; net -3.8L since admission  Medication Assistance / Insurance Benefits Check: Does the patient have prescription insurance?  Yes Type of insurance plan: UHC Medicare  Does the patient qualify for medication assistance through manufacturers or grants?   Yes Eligible grants and/or patient assistance programs: may be eligible for [employer] grant Medication assistance applications in progress: [employer]well grant   Medication assistance applications approved: none Approved medication assistance renewals will be completed by: pending  Outpatient Pharmacy:  Prior to admission outpatient [facility] Is the patient willing to use [facility] pharmacy at discharge? Yes Is the patient willing to transition their outpatient pharmacy to utilize a [facility] outpatient pharmacy?   No    Assessment: 1. Acute on chronic systolic CHF (LVEF 20-25%), due to NICM. NYHA class II symptoms. - Does not appear volume overloaded on exam. Strict I/Os and daily weights.  - Holding carvedilol , Entresto , and spironolactone  with hypotensive episodes on 2/4 and with persistent bradycardia - Can consider starting Farxiga  10 mg daily today since this will have less effect on BP and help optimize GDMT in the setting of HFrEF.  Plan: 1) Medication changes recommended at this time: - Start Farxiga  10 mg daily  2) Patient assistance: - Has $266 deductible left on her insurance plan - Entresto  copay after deductible met $47 Farxiga Cindi copay after deductible met $47 - Patient reports she is unable to afford her medications. [employer] grant approved and will cover the cost of her Entresto  and Farxiga . Information provided to the patient and will be given to the pharmacy when scripts are received. Her kids usually pick up her meds from the pharmacy.  3)  Education  - Initial education complete - Full education to be completed prior to discharge  Duwaine Plant, PharmD, BCPS Heart Failure Stewardship Pharmacist Phone [phone]

## 2023-12-06 NOTE — Assessment & Plan Note (Signed)
 BP in the 100-120s/50-80s. On Coreg  BID, Entresto  BID, and Spironolactone  daily, will hold for now. BPs and MAPs stabilized this AM. - Monitor VS - Restart medications as able, BP permitting

## 2023-12-06 NOTE — Assessment & Plan Note (Addendum)
 CTPE negative for PE. TEE demonstrated EF <20%, LV with severely decreased function, LV global hypokinesis, RA severely dilated, moderate MVR, mild-mod TVR, trivial AVR, and mild G2 layered plaque involving the descending aorta.  Possible tachycardia mediated cardiomyopathy per EP.  Given 1 dose of 40 mg IV Lasix  2/1; euvolemic on exam today.  Cardiology would like to start Entresto  24-26 mg BID, Farxiga  10 mg daily and continue Coreg  6.25 mg BID, but discontinued Hydralazine .  [employer] grant approved and will cover the cost of Entresto  and Farxiga .  BP stabilized this AM. - Holding Entresto  24-26 mg BID, Coreg  6.25 mg BID, Spironolactone  12.5 daily - Cardiology started Farxiga  10 mg daily - Restart GDMT as able, BP permitting - Daily weights  - Strict I/O - Monitor volume status, diurese as appropriate - AM BMP and Mag - Repeat echo in 3 months, if systolic fxn remains reduced may require ischemic eval; will address in DC summary

## 2023-12-06 NOTE — Assessment & Plan Note (Addendum)
 S/p TEE/Cardioversion which was successful, but has had PAF, mostly rate controlled with episodes of RVR.  Transitioned from heparin  to Eliquis .  TEE demonstrated EF <20%, LV with severely decreased function, LV global hypokinesis, RA severely dilated, moderate MVR, mild-mod TVR, trivial AVR, and mild G2 layered plaque involving the descending aorta.  Coreg  is currently still being held with her HR being in the low 60s or less.  Cardiology started Tikosyn  250 mcg BID since she isn't a good candidate for amio with her ILD. - Cardiology and EP consulted, appreciate recommendations - Holding Coreg  in setting of hypotension, MAPs have stabilized, restart as able - Tikosyn  decreased to 125 mcg BID, after receiving 250 mg AM dose - Replete electrolyte goals: K>4 and Mag>2 - Continue Eliquis  for now - Pending family discussion with pharmacy regarding AC and ability to afford Eliquis  vs Xarelto, if unable to cover cost will consider Lovenox 1 mg/kg BID this PM and then transition to warfarin per pharmacy. Seems as if family is amenable to helping with costs

## 2023-12-06 NOTE — Assessment & Plan Note (Deleted)
 Pain in R>L foot. TTP palpation and with walking. Concern for gout with hx, however uric acid 5.0. - R foot XR - Colchicine  0.6 mg daily

## 2023-12-06 NOTE — Progress Notes (Signed)
 Pharmacy: Dofetilide  (Tikosyn ) - Follow Up Assessment and Electrolyte Replacement  Pharmacy consulted to assist in monitoring and replacing electrolytes in this 78 y.o. female admitted on 12/01/2023 undergoing dofetilide  initiation. First dofetilide  dose: 250 mcg BID.  Per EP note, when Qtc manually calculated, it is stable at 460-480 ms.   Labs:    Component Value Date/Time   K 4.7 12/06/2023 0659   MG 2.2 12/06/2023 0000     Plan: Potassium: K >/= 4: No additional supplementation needed  Magnesium : Mg > 2: No additional supplementation needed   Thank you for allowing pharmacy to participate in this patient's care   Massie Fila, PharmD Clinical Pharmacist  12/06/2023 8:04 AM

## 2023-12-06 NOTE — Progress Notes (Addendum)
 Electrophysiology Rounding Note  Patient Name: Donna Herrera Date of Encounter: 12/06/2023  Primary Cardiologist: Vina Gull, MD  Electrophysiologist: None    Subjective   Pt remains in NSR on Tikosyn  250 mcg BID   QTc from EKG last pm shows stable QTc at 460-480 when measured manually and corrected for U wave  The patient is doing well today.  At this time, the patient denies chest pain, shortness of breath, or any new concerns.  Inpatient Medications    Scheduled Meds:  allopurinol   100 mg Oral Daily   apixaban   5 mg Oral BID   cholecalciferol   5,000 Units Oral Daily   dofetilide   250 mcg Oral BID   DULoxetine   30 mg Oral Daily   ezetimibe   10 mg Oral Daily   ferrous sulfate   325 mg Oral QODAY   gabapentin   300 mg Oral QHS   lidocaine   1 patch Transdermal Q24H   sodium chloride  flush  3 mL Intravenous Q12H   Continuous Infusions:  PRN Meds: acetaminophen , albuterol , famotidine , oxyCODONE , phenol, sodium chloride  flush   Vital Signs    Vitals:   12/06/23 0032 12/06/23 0356 12/06/23 0615 12/06/23 0712  BP: 105/60 (!) 143/80  122/60  Pulse:  61  62  Resp: 18 17  15   Temp: 97.8 F (36.6 C) 97.9 F (36.6 C)  97.8 F (36.6 C)  TempSrc: Oral Oral  Oral  SpO2: 94% 97%  94%  Weight:   73.9 kg   Height:        Intake/Output Summary (Last 24 hours) at 12/06/2023 0728 Last data filed at 12/06/2023 0034 Gross per 24 hour  Intake 1334 ml  Output 2000 ml  Net -666 ml   Filed Weights   12/04/23 0410 12/05/23 0325 12/06/23 0615  Weight: 73.5 kg 72.5 kg 73.9 kg    Physical Exam    GEN- NAD, A&O x 3. Normal affect.  Lungs- CTAB, Normal effort.  Heart- Regular rate and rhythm. No M/G/R GI- Soft, NT, ND Extremities- No clubbing, cyanosis, or edema Skin- no rash or lesion  Labs    CBC Recent Labs    12/05/23 1051 12/06/23 0000  WBC 6.3 5.3  HGB 11.0* 11.5*  HCT 35.4* 36.5  MCV 89.6 89.0  PLT 263 263   Basic Metabolic Panel Recent Labs     12/05/23 1056 12/05/23 1706 12/05/23 2023 12/06/23 0000  NA 134*   < > 134* 134*  K 3.7   < > 5.8* 4.9  CL 100   < > 100 101  CO2 25   < > 24 26  GLUCOSE 190*   < > 149* 124*  BUN 10   < > 11 9  CREATININE 0.99   < > 0.85 0.84  CALCIUM  8.5*   < > 9.0 8.8*  MG 2.2  --   --  2.2   < > = values in this interval not displayed.    Telemetry    Sinusbrady/NSR 50-60s, occasionally having blocked PACs vs dropped beats (personally reviewed)  Patient Profile     Donna Herrera is a 78 y.o. female with a past medical history significant for persistent atrial fibrillation.  They were admitted for tikosyn  load.   Assessment & Plan    Persistent atrial fibrillation Pt remains in NSR on Tikosyn  250 mcg BID  Continue Eliquis  5 mg BID Creatinine, ser  0.84 (02/05 0000) Magnesium   2.2 (02/05 0000) Potassium4.9 (02/05 0000) No electrolyte supplementation needed  Acute  on Chronic systolic CHF Non ischemic by history EF <20% this admission likely in setting of tachy.  GDMT per primary.    For questions or updates, please contact [facility] Please consult [email or web address] for contact info under Cardiology/STEMI.  Signed, Ozell Prentice Passey, PA-C  12/06/2023, 7:28 AM

## 2023-12-06 NOTE — Assessment & Plan Note (Signed)
>>  ASSESSMENT AND PLAN FOR LONGSTANDING PERSISTENT ATRIAL FIBRILLATION (HCC) WRITTEN ON 12/06/2023  1:39 PM BY GOMES, ADRIANA, DO  S/p TEE/Cardioversion which was successful, but has had PAF, mostly rate controlled with episodes of RVR.  Transitioned from heparin  to Eliquis .  TEE demonstrated EF <20%, LV with severely decreased function, LV global hypokinesis, RA severely dilated, moderate MVR, mild-mod TVR, trivial AVR, and mild G2 layered plaque involving the descending aorta.  Coreg  is currently still being held with her HR being in the low 60s or less.  Cardiology started Tikosyn  250 mcg BID since she isn't a good candidate for amio with her ILD. - Cardiology and EP consulted, appreciate recommendations - Holding Coreg  in setting of hypotension, MAPs have stabilized, restart as able - Tikosyn  decreased to 125 mcg BID, after receiving 250 mg AM dose - Replete electrolyte goals: K>4 and Mag>2 - Continue Eliquis  for now - Pending family discussion with pharmacy regarding AC and ability to afford Eliquis  vs Xarelto, if unable to cover cost will consider Lovenox 1 mg/kg BID this PM and then transition to warfarin per pharmacy. Seems as if family is amenable to helping with costs

## 2023-12-06 NOTE — Assessment & Plan Note (Addendum)
 S/p R total hip replacement. Has a hx of SCFE and DJD. Complains of R hip pain to light palpation. Imaging revealed old fusion of L hip, R hip arthroplasty with acetabular protrusion & superior subluxation & bone resorption around the proximal femur. Follows orthopedics outpatient. R hip XR with no acute changes. - Pain regimen: Lidocaine patch q24h. Tylenol for mild pain and Oxycodone 2.5-5 mg for moderate/severe pain, K pad as tolerated, consider Voltaren gel  - PT/OT - OOB as tolerated

## 2023-12-07 ENCOUNTER — Other Ambulatory Visit (HOSPITAL_COMMUNITY): Payer: Self-pay

## 2023-12-07 DIAGNOSIS — I4891 Unspecified atrial fibrillation: Secondary | ICD-10-CM | POA: Diagnosis not present

## 2023-12-07 DIAGNOSIS — I959 Hypotension, unspecified: Secondary | ICD-10-CM | POA: Diagnosis not present

## 2023-12-07 DIAGNOSIS — I5041 Acute combined systolic (congestive) and diastolic (congestive) heart failure: Secondary | ICD-10-CM | POA: Diagnosis not present

## 2023-12-07 LAB — BASIC METABOLIC PANEL
Anion gap: 11 (ref 5–15)
BUN: 13 mg/dL (ref 8–23)
CO2: 26 mmol/L (ref 22–32)
Calcium: 9.3 mg/dL (ref 8.9–10.3)
Chloride: 99 mmol/L (ref 98–111)
Creatinine, Ser: 0.95 mg/dL (ref 0.44–1.00)
GFR, Estimated: >60 mL/min (ref >60)
Glucose, Bld: 109 mg/dL — ABNORMAL HIGH (ref 70–99)
Potassium: 4.4 mmol/L (ref 3.5–5.1)
Sodium: 136 mmol/L (ref 135–145)

## 2023-12-07 LAB — CBC
HCT: 38.7 % (ref 36.0–46.0)
Hemoglobin: 12.1 g/dL (ref 12.0–15.0)
MCH: 27.8 pg (ref 26.0–34.0)
MCHC: 31.3 g/dL (ref 30.0–36.0)
MCV: 88.8 fL (ref 80.0–100.0)
Platelets: 287 10*3/uL (ref 150–400)
RBC: 4.36 MIL/uL (ref 3.87–5.11)
RDW: 15.3 % (ref 11.5–15.5)
WBC: 6.4 10*3/uL (ref 4.0–10.5)
nRBC: 0 % (ref 0.0–0.2)

## 2023-12-07 LAB — MAGNESIUM: Magnesium: 2.3 mg/dL (ref 1.7–2.4)

## 2023-12-07 MED ORDER — DOFETILIDE 125 MCG PO CAPS
125.0000 ug | ORAL_CAPSULE | Freq: Two times a day (BID) | ORAL | Status: DC
  Filled 2023-12-07: qty 1

## 2023-12-07 MED ORDER — DOFETILIDE 125 MCG PO CAPS
125.0000 ug | ORAL_CAPSULE | Freq: Two times a day (BID) | ORAL | Status: DC
  Administered 2023-12-07 – 2023-12-08 (×3): 125 ug via ORAL
  Filled 2023-12-07 (×5): qty 1

## 2023-12-07 MED ORDER — LOSARTAN POTASSIUM 25 MG PO TABS
12.5000 mg | ORAL_TABLET | Freq: Every day | ORAL | Status: DC
  Administered 2023-12-07 – 2023-12-08 (×2): 12.5 mg via ORAL
  Filled 2023-12-07 (×2): qty 1

## 2023-12-07 NOTE — Assessment & Plan Note (Addendum)
 S/p R total hip replacement. Has a hx of SCFE and DJD. Complains of R hip pain to light palpation. Imaging revealed old fusion of L hip, R hip arthroplasty with acetabular protrusion & superior subluxation & bone resorption around the proximal femur. Follows orthopedics outpatient. R hip XR with no acute changes. - Pain regimen: Lidocaine patch q24h. Tylenol for mild pain and Oxycodone 2.5-5 mg for moderate/severe pain, K pad as tolerated, consider Voltaren gel  - PT/OT - OOB as tolerated

## 2023-12-07 NOTE — Progress Notes (Signed)
 Daily Progress Note Intern Pager: [phone]  Patient name: Donna Herrera Medical record number: [id number] Date of birth: 09-09-1946 Age: 78 y.o. Gender: female  Primary Care Provider: Lonnie Earnest, MD Consultants: Cardiology, EP  Code Status: Full Code   Pt Overview and Major Events to Date:  1/31: Admitted 2/3: TEE/Cardioversion > converted to NSR > PAF > spontaneous NSR  Tikosyn   Assessment and Plan: Donna Herrera is 78 yo female w/ past medical history of aortic atherosclerosis, cervical DDD, DJD, GERD, CHF, Vit D deficiency, prediabetes, OA, pulmonary fibrosis, high risk fall presenting with new onset A-fib.  Transitioned to Elqiuis, however there is an ongoing discussion at this time to determine the most cost-effective/affordable AC option for her and her family.  S/p TEE/cardioversion which was successful.  Has had multiple episodes of paroxysmal A-fib.  Spontaneously converted back to NSR while ambulating yesterday and has stayed in NSR since.    QTc remains prolonged from 524>536 with Tikosyn  lowered to 125 mcg BID yesterday evening. EP recommends staying the course and continuing Tikosyn  as scheduled since corrected Qtc was slightly prolonged 470-490.   Pharmacy to discuss [facility] options with family regarding cost of medications.  [employer] grant approved and will cover the cost of her Entresto  and Farxiga .   Assessment & Plan New onset a-fib [facility]) S/p TEE/Cardioversion which was successful, but has had PAF, mostly rate controlled with episodes of RVR.  Transitioned from heparin  to Eliquis .  Coreg  is currently still being held with her HR being in the low 60s or less.  Cardiology started Tikosyn  since she isn't a good candidate for amio with her ILD. - Cardiology and EP consulted, appreciate recommendations - Holding Coreg  in setting of hypotension, MAPs have stabilized, restart as able - Continue Tikosyn  125 mcg BID - Replete electrolyte goals: K>4 and  Mag>2 - Continue Eliquis  - Family is amenable to helping cover medication costs Acute systolic heart failure (HCC) CTPE negative for PE. TEE demonstrated EF <20%, LV with severely decreased function, LV global hypokinesis, RA severely dilated, moderate MVR, mild-mod TVR, trivial AVR, and mild G2 layered plaque involving the descending aorta.  Possible tachycardia mediated cardiomyopathy per EP.  Given 1 dose of 40 mg IV Lasix  2/1; euvolemic on exam today.  Cardiology would like to start Entresto  24-26 mg BID, Farxiga  10 mg daily and continue Coreg  6.25 mg BID, but discontinued Hydralazine .  [employer] grant approved and will cover the cost of Entresto  and Farxiga .  BP stabilized this AM. - Holding Entresto  24-26 mg BID, Coreg  6.25 mg BID, Spironolactone  12.5 daily - Cardiology started Losartan  12.5 mg daily and continued Farxiga  10 mg daily - Restart GDMT as able, BP permitting - Daily weights  - Strict I/O - Monitor volume status, diurese as appropriate - AM BMP and Mag - Repeat echo in 3 months, if systolic fxn remains reduced may require ischemic eval; will address in DC summary Normocytic anemia New onset over the past 6 months, no sources of bleeding. Hgb stable 12.1.  Last colonoscopy in 2021, no further recommended screening per GI.  Iron studies with slightly low sat ratio. - Ferrous sulfate  325 mg every other day Essential hypertension BP in the 120-140s/50-80s. On Coreg  BID, Entresto  BID, and Spironolactone  daily, will hold for now. BPs and MAPs stabilized this AM. - Monitor VS - Cardiology started Losartan  12.5 mg today - Restart medications as able, BP permitting Right hip pain S/p R total hip replacement. Has a hx of SCFE and DJD.  Complains of R hip pain to light palpation. Imaging revealed old fusion of L hip, R hip arthroplasty with acetabular protrusion & superior subluxation & bone resorption around the proximal femur. Follows orthopedics outpatient. R hip XR with no acute  changes. - Pain regimen: Lidocaine  patch q24h. Tylenol  for mild pain and Oxycodone  2.5-5 mg for moderate/severe pain, K pad as tolerated, consider Voltaren  gel  - PT/OT - OOB as tolerated  AKI (acute kidney injury) (HCC) Cr bumped from 0.8 > 1.10. Has improved since, Cr 0.95 today.  - Encourage PO ad lib - AM BMP Bilateral leg pain Chronic, likely in the setting of dependent edema. DVT U/S negative for acute DVT. Bilateral foot pain R>L. TTP palpation and with walking. Concern for gout with hx, however uric acid 5.0. L/R foot XR demonstrated severe OA. - Continue Colchicine  0.6 mg daily - Continue to monitor  Chronic and Stable Problems:  COPD: Albuterol  prn  Gout: Continue home allopurinol . Chronic Right Hip Pain: Takes Oxycodone  5 mg q6h PRN at home. Added on Oxycodone  2.5-5mg  q6h PRN for moderate/severe pain and Lidocaine  patch q24h. Anxiety/Depression: Continue Cymbalta  30 mg daily Vitamin D  deficiency: Contine D3 5000u daily HLD: Continue Zetia  10 mg daily.  Lipid panel repeated 4 weeks ago demonstrated LDL 138. GERD: Continue Famotidine  20 mg daily PRN Neuropathy: Continue Gabapentin  300 mg nightly Prediabetes: A1c 4 weeks ago 6.0.  Bilateral leg pain: Chronic, likely in the setting of dependent edema. DVT U/S negative for acute DVT.  FEN/GI: Heart healthy  PPx: Eliquis   Dispo: Home pending stability on Tikosyn   Subjective:  Patient is doing better this morning however her arthritic pain has been bothering her. Received PRN oxy. Her foot pain has improved today with getting colchicine .   Objective: Temp:  [97.6 F (36.4 C)-98.1 F (36.7 C)] 98.1 F (36.7 C) (02/06 1150) Pulse Rate:  [61-68] 61 (02/06 1150) Resp:  [15-18] 18 (02/06 1150) BP: (120-141)/(59-86) 122/67 (02/06 1150) SpO2:  [93 %-97 %] 96 % (02/06 1150) Weight:  [71.6 kg] 71.6 kg (02/06 0030) Physical Exam: General: Resting comfortably in bed in NAD.  Cardiovascular: RRR. No M/R/G  Respiratory: CTAB.  Normal WOB on RA. Abdomen: Soft, non-tender, non-distended. Normoactive bowel sounds.  Extremities: R hip tender chronically. Shin/ankle pain improved. No BLE edema.    Laboratory: Most recent CBC Lab Results  Component Value Date   WBC 6.4 12/07/2023   HGB 12.1 12/07/2023   HCT 38.7 12/07/2023   MCV 88.8 12/07/2023   PLT 287 12/07/2023   Most recent BMP    Latest Ref Rng & Units 12/07/2023    2:56 AM  BMP  Glucose 70 - 99 mg/dL 890   BUN 8 - 23 mg/dL 13   Creatinine 9.55 - 1.00 mg/dL 9.04   Sodium 864 - 854 mmol/L 136   Potassium 3.5 - 5.1 mmol/L 4.4   Chloride 98 - 111 mmol/L 99   CO2 22 - 32 mmol/L 26   Calcium  8.9 - 10.3 mg/dL 9.3    Mag: 2.3  Imaging/Diagnostic Tests: EKG: NSR. Rate 65. L axis. Qtc prolonged to 536.  Janna Ferrier, DO 12/07/2023, 4:29 PM  PGY-1, [facility] Family Medicine FPTS Intern pager: [phone], text pages welcome Secure chat group [facility] Teaching Service

## 2023-12-07 NOTE — Assessment & Plan Note (Addendum)
 S/p TEE/Cardioversion which was successful, but has had PAF, mostly rate controlled with episodes of RVR.  Transitioned from heparin  to Eliquis .  Coreg  is currently still being held with her HR being in the low 60s or less.  Cardiology started Tikosyn  since she isn't a good candidate for amio with her ILD. - Cardiology and EP consulted, appreciate recommendations - Holding Coreg  in setting of hypotension, MAPs have stabilized, restart as able - Continue Tikosyn  125 mcg BID - Replete electrolyte goals: K>4 and Mag>2 - Continue Eliquis  - Family is amenable to helping cover medication costs

## 2023-12-07 NOTE — Progress Notes (Addendum)
 Electrophysiology Rounding Note  Patient Name: Donna Herrera Date of Encounter: 12/07/2023  Primary Cardiologist: Vina Gull, MD  Electrophysiologist: None    Subjective   Pt remains in NSR on Tikosyn  125 mcg BID   QTc from EKG last pm shows borderline QTc at ~470-480 when measured manually  The patient is doing well today.  Wants to get up and move more. At this time, the patient denies chest pain, shortness of breath, or any new concerns.  Inpatient Medications    Scheduled Meds:  allopurinol   100 mg Oral Daily   apixaban   5 mg Oral BID   cholecalciferol   5,000 Units Oral Daily   colchicine   0.6 mg Oral Daily   dapagliflozin  propanediol  10 mg Oral Daily   dofetilide   125 mcg Oral BID   DULoxetine   30 mg Oral Daily   ezetimibe   10 mg Oral Daily   ferrous sulfate   325 mg Oral QODAY   gabapentin   300 mg Oral QHS   lidocaine   1 patch Transdermal Q24H   sodium chloride  flush  3 mL Intravenous Q12H   Continuous Infusions:  PRN Meds: acetaminophen , albuterol , famotidine , oxyCODONE , phenol, sodium chloride  flush   Vital Signs    Vitals:   12/06/23 2022 12/07/23 0030 12/07/23 0403 12/07/23 0757  BP: 120/86 136/74 (!) 141/59 122/75  Pulse: 62 68 66 62  Resp: 16 16 15 18   Temp: 98 F (36.7 C) 97.6 F (36.4 C) 98 F (36.7 C) 98.1 F (36.7 C)  TempSrc: Oral Oral Oral Oral  SpO2: 93% 96% 97% 96%  Weight:  71.6 kg    Height:        Intake/Output Summary (Last 24 hours) at 12/07/2023 0841 Last data filed at 12/07/2023 0032 Gross per 24 hour  Intake 683 ml  Output 3150 ml  Net -2467 ml   Filed Weights   12/05/23 0325 12/06/23 0615 12/07/23 0030  Weight: 72.5 kg 73.9 kg 71.6 kg    Physical Exam    GEN- NAD, A&O x 3. Normal affect.  Lungs- CTAB, Normal effort.  Heart- Regular rate and rhythm. No M/G/R GI- Soft, NT, ND Extremities- No clubbing, cyanosis, or edema Skin- no rash or lesion  Labs    CBC Recent Labs    12/06/23 0000 12/07/23 0256   WBC 5.3 6.4  HGB 11.5* 12.1  HCT 36.5 38.7  MCV 89.0 88.8  PLT 263 287   Basic Metabolic Panel Recent Labs    97/94/74 0000 12/06/23 0659 12/07/23 0256  NA 134* 134* 136  K 4.9 4.7 4.4  CL 101 99 99  CO2 26 25 26   GLUCOSE 124* 94 109*  BUN 9 9 13   CREATININE 0.84 0.82 0.95  CALCIUM  8.8* 8.9 9.3  MG 2.2  --  2.3    Telemetry    NSR 60-70s (personally reviewed)  Patient Profile     Donna Herrera is a 78 y.o. female with a past medical history significant for persistent atrial fibrillation.  They were admitted for tikosyn  load.   Assessment & Plan    Persistent atrial fibrillation Pt remains in NSR on Tikosyn  125 mcg BID  Continue Eliquis  Creatinine, ser  0.95 (02/06 0256) Magnesium   2.3 (02/06 0256) Potassium4.4 (02/06 0256) No electrolyte supplementation needed  QT has remained borderline, but is OK when measured manually. Follow with am dose.   Dr. Cindie has seen   For questions or updates, please contact [facility] Please consult [email or web address] for contact info under  Cardiology/STEMI.  Signed, Ozell Prentice Passey, PA-C  12/07/2023, 8:41 AM

## 2023-12-07 NOTE — Progress Notes (Signed)
 Morning EKG reviewed     Shows remains in NSR with borderline QTc at 470-490 ms.  Continue Tikosyn  125 mcg BID.  Please consult with EP team prior to holding or adjusting dose timing.   Potassium4.4 (02/06 0256) Magnesium   2.3 (02/06 0256) Creatinine, ser  0.95 (02/06 0256)  Plan for home Friday if QTc remains stable   Ozell Prentice Passey, [place]  12/07/2023 12:53 PM

## 2023-12-07 NOTE — Progress Notes (Signed)
 Pharmacy: Dofetilide  (Tikosyn ) - Follow Up Assessment and Electrolyte Replacement  Pharmacy consulted to assist in monitoring and replacing electrolytes in this 78 y.o. female admitted on 12/01/2023 undergoing dofetilide  initiation. First dofetilide  dose: 250 mcg BID>> 125 mcg BID.   Per discussion with EP, OK to continue Tikosyn  this AM at a reduced dose. Will reevaluate after AM dose.   Labs:    Component Value Date/Time   K 4.4 12/07/2023 0256   MG 2.3 12/07/2023 0256     Plan: Potassium: K >/= 4: No additional supplementation needed  Magnesium : Mg > 2: No additional supplementation needed   Thank you for allowing pharmacy to participate in this patient's care   Massie Fila, PharmD Clinical Pharmacist  12/07/2023 8:30 AM

## 2023-12-07 NOTE — TOC Progression Note (Addendum)
 Transition of Care [facility]) - Progression Note    Patient Details  Name: Donna Herrera MRN: [id number] Date of Birth: [birth year]  [facility] CM/SW Contact  Waddell Barnie Rama, RN Phone Number: 12/07/2023, 4:18 PM  Clinical Narrative:    Plan for poss dc tomorrow,  she is set up with [facility] for HHPT, HHOT.  Will get #30 tikosyn  from [facility],  he copay is 47.00 from  pharmacy benefit check,  NCM notified grand daughter of possible dc tomorrow.     Expected Discharge Plan: Home w Home Health Services Barriers to Discharge: Continued Medical Work up  Expected Discharge Plan and Services In-house Referral: NA Discharge Planning Services: CM Consult Post Acute Care Choice: Home Health Living arrangements for the past 2 months: Single Family Home                 DME Arranged: N/A DME Agency: NA       HH Arranged: PT, OT HH Agency: [facility] Date [facility] Agency Contacted: 12/06/23 Time HH Agency Contacted: 1629 Representative spoke with at [facility] Agency: Darleene   Social Determinants of Health (SDOH) Interventions SDOH Screenings   Food Insecurity: No Food Insecurity (12/03/2023)  Housing: Low Risk  (12/05/2023)  Transportation Needs: No Transportation Needs (12/05/2023)  Utilities: Not At Risk (12/03/2023)  Alcohol Screen: Low Risk  (12/05/2023)  Depression (PHQ2-9): High Risk (12/01/2023)  Financial Resource Strain: Low Risk  (12/05/2023)  Physical Activity: Insufficiently Active (04/28/2023)  Social Connections: Moderately Isolated (12/03/2023)  Stress: No Stress Concern Present (04/28/2023)  Tobacco Use: Medium Risk (12/04/2023)    Readmission Risk Interventions     No data to display

## 2023-12-07 NOTE — Assessment & Plan Note (Addendum)
 Chronic, likely in the setting of dependent edema. DVT U/S negative for acute DVT. Bilateral foot pain R>L. TTP palpation and with walking. Concern for gout with hx, however uric acid 5.0. L/R foot XR demonstrated severe OA. - Continue Colchicine  0.6 mg daily - Continue to monitor

## 2023-12-07 NOTE — Progress Notes (Signed)
 Occupational Therapy Treatment Patient Details Name: Donna Herrera MRN: [id number] DOB: Apr 18, 1946 Today's Date: 12/07/2023   History of present illness Pt is a 78 y.o. female who presented 12/01/23 with worsening dyspnea x2 weeks and R hip pain. Imaging negative for PE. EKG demonstrated new onset atrial fibrillation. Imaging also showed previous R hip arthroplasty with acetabular protrusion and superior subluxation as well as bone resorption around the proximal femur. S/p successful cardioversion 2/3. PMH: atherosclerosis of aorta, CHF, depression, DJD, dyshidrotic eczema, HTN, GERD, gout, HLD, HTN, pulmonary fibrosis, pre-diabetes, L breast mass, pulmonary nodule   OT comments  OT session focused on training in techniques for increased safety and independence with bed mobility in preparation for tasks with pt requiring Min assist and increased time, and on B UE therapeutic exercises (see details below) while sitting EOB to increase strength, activity tolerance, and dynamic sitting balance for carryover to functional tasks. Pt participated well but was limited by fatigue and lethargy this session. Pt making progress toward goals. Pt's VSS on RA throughout session. OT to continue to follow acutely to address deficits outlined below and increase safety and independence with functional tasks. Post acute discharge, pt will benefit from continued skilled OT services in the home to maximize rehab potential.       If plan is discharge home, recommend the following:  A little help with walking and/or transfers;A little help with bathing/dressing/bathroom;Assistance with cooking/housework;Assist for transportation;Help with stairs or ramp for entrance   Equipment Recommendations  None recommended by OT;Other (comment) (pt to benefit from education regarding AE available)    Recommendations for Other Services      Precautions / Restrictions Precautions Precautions: Fall Restrictions Weight  Bearing Restrictions Per Provider Order: No       Mobility Bed Mobility Overal bed mobility: Needs Assistance Bed Mobility: Supine to Sit     Supine to sit: Min assist, HOB elevated, Used rails     General bed mobility comments: Pt required increased time and with pt requesting R HHA to pull her trunk up to sit at EOB    Transfers Overall transfer level: Needs assistance                 General transfer comment: Funcitonal transfers/OOB activity deferred this session for pt/therapist safety due to pt fatigue and frequent lethargy during session.     Balance Overall balance assessment: Needs assistance Sitting-balance support: No upper extremity supported, Feet supported Sitting balance-Leahy Scale: Good                                     ADL either performed or assessed with clinical judgement   ADL Overall ADL's : Needs assistance/impaired                                       General ADL Comments: Pt declined addressing ADLs this session secondary to fatigue and performing ADLs with NT earlier this day.    Extremity/Trunk Assessment Upper Extremity Assessment Upper Extremity Assessment: Right hand dominant;RUE deficits/detail;LUE deficits/detail;Generalized weakness RUE Deficits / Details: Pt reports hx of  bil rotator cuff tears. Able to lift RUE functionally to ~160 degrees shoulder flexion. Generalized weakness. LUE Deficits / Details: Pt reports hx of bilat rotator cuff tears. Pt more impaired on L with ongoing shoulder pain and able to flex  shoulder to ~90 degrees with notable compensatory abduction as well. Generalized weakness.   Lower Extremity Assessment Lower Extremity Assessment: Defer to PT evaluation        Vision       Perception     Praxis      Cognition Arousal: Lethargic, Alert Behavior During Therapy: WFL for tasks assessed/performed, Flat affect Overall Cognitive Status: Within Functional Limits for  tasks assessed                                 General Comments: Pt asleep upon arrival and with increased alertness once sitting EOB but remaining largely lethargic throughout session with verbal cues and activity required for continuing alertness.        Exercises Exercises: General Upper Extremity, Hand exercises General Exercises - Upper Extremity Shoulder Horizontal ABduction: AROM, Strengthening, Both, 10 reps, Seated (sitting EOB; increased activity tolerance; increased dynamic sitting balance) Shoulder Horizontal ADduction: AROM, Strengthening, Both, 10 reps, Seated (sitting EOB; increased activity tolerance; increased dynamic sitting balance) Elbow Flexion: AROM, Strengthening, Both, 10 reps, Seated (sitting EOB; increased activity tolerance; increased dynamic sitting balance) Elbow Extension: AROM, Strengthening, Both, 10 reps, Seated (sitting EOB; increased activity tolerance; increased dynamic sitting balance) Wrist Flexion: AROM, Strengthening, Both, 10 reps, Seated (sitting EOB; increased activity tolerance; increased dynamic sitting balance) Wrist Extension: AROM, Strengthening, Both, 10 reps, Seated (sitting EOB; increased activity tolerance; increased dynamic sitting balance) Hand Exercises Forearm Supination: AROM, Strengthening, Both, 10 reps, Seated (sitting EOB; increased activity tolerance; increased dynamic sitting balance) Forearm Pronation: AROM, Strengthening, Both, 10 reps, Seated (sitting EOB; increased activity tolerance; increased dynamic sitting balance) Opposition: AROM, Strengthening, Both, 10 reps, Seated (sitting EOB; increased activity tolerance; increased dynamic sitting balance)    Shoulder Instructions       General Comments HR in the 60s to low 70s and O2 >/94% on RA throughout session. Pt daughter arriving toward end of session and NT entering room at end of session.    Pertinent Vitals/ Pain       Pain Assessment Pain Assessment:  Faces Faces Pain Scale: Hurts little more Pain Location: R hip, L dorsal distal foot, R lateral foot; B shoulders with AROM Pain Descriptors / Indicators: Aching, Sharp, Grimacing Pain Intervention(s): Limited activity within patient's tolerance, Monitored during session, Repositioned  Home Living                                          Prior Functioning/Environment              Frequency  Min 1X/week        Progress Toward Goals  OT Goals(current goals can now be found in the care plan section)  Progress towards OT goals: Progressing toward goals  Acute Rehab OT Goals Patient Stated Goal: to return home  Plan      Co-evaluation                 AM-PAC OT 6 Clicks Daily Activity     Outcome Measure   Help from another person eating meals?: None Help from another person taking care of personal grooming?: A Little Help from another person toileting, which includes using toliet, bedpan, or urinal?: A Little Help from another person bathing (including washing, rinsing, drying)?: A Lot Help from another person to put on and  taking off regular upper body clothing?: A Little Help from another person to put on and taking off regular lower body clothing?: A Lot 6 Click Score: 17    End of Session    OT Visit Diagnosis: Muscle weakness (generalized) (M62.81);Other (comment) (decreased activity tolerance)   Activity Tolerance Patient tolerated treatment well;Patient limited by fatigue;Patient limited by lethargy   Patient Left in bed;with call bell/phone within reach;with nursing/sitter in room;with family/visitor present;with bed alarm set (sitting EOB with daughter and NT present)   Nurse Communication Mobility status        Time: 8385-8362 OT Time Calculation (min): 23 min  Charges: OT General Charges $OT Visit: 1 Visit OT Treatments $Therapeutic Activity: 8-22 mins $Therapeutic Exercise: 8-22 mins  Margarie Rockey HERO., OTR/L,  MA Acute Rehab [phone]   Margarie FORBES Horns 12/07/2023, 5:30 PM

## 2023-12-07 NOTE — Assessment & Plan Note (Signed)
 Cr bumped from 0.8 > 1.10. Has improved since, Cr 0.95 today.  - Encourage PO ad lib - AM BMP

## 2023-12-07 NOTE — Assessment & Plan Note (Addendum)
 New onset over the past 6 months, no sources of bleeding.  Hgb stable 12.1.  Last colonoscopy in 2021, no further recommended screening per GI.  Iron studies with slightly low sat ratio. - Ferrous sulfate  325 mg every other day

## 2023-12-07 NOTE — Assessment & Plan Note (Addendum)
 BP in the 120-140s/50-80s. On Coreg  BID, Entresto  BID, and Spironolactone  daily, will hold for now. BPs and MAPs stabilized this AM. - Monitor VS - Cardiology started Losartan  12.5 mg today - Restart medications as able, BP permitting

## 2023-12-07 NOTE — Progress Notes (Signed)
 Patient Name: Donna Herrera Date of Encounter: 12/07/2023 [facility] Cardiologist: Vina Gull, MD   Interval Summary  .    Feeling well.  Eager to go home.   Vital Signs .    Vitals:   12/06/23 2022 12/07/23 0030 12/07/23 0403 12/07/23 0757  BP: 120/86 136/74 (!) 141/59 122/75  Pulse: 62 68 66 62  Resp: 16 16 15 18   Temp: 98 F (36.7 C) 97.6 F (36.4 C) 98 F (36.7 C) 98.1 F (36.7 C)  TempSrc: Oral Oral Oral Oral  SpO2: 93% 96% 97% 96%  Weight:  71.6 kg    Height:        Intake/Output Summary (Last 24 hours) at 12/07/2023 1116 Last data filed at 12/07/2023 0924 Gross per 24 hour  Intake 483 ml  Output 2950 ml  Net -2467 ml      12/07/2023   12:30 AM 12/06/2023    6:15 AM 12/05/2023    3:25 AM  Last 3 Weights  Weight (lbs) 157 lb 13.6 oz 162 lb 14.7 oz 159 lb 13.3 oz  Weight (kg) 71.6 kg 73.9 kg 72.5 kg      Telemetry/ECG    Sinus rhythm/sinus bradycardia. Personally Reviewed  Echo 12/02/23:  1. No evidence of LV thrombus. Left ventricular ejection fraction, by  estimation, is 20 to 25%. The left ventricle has severely decreased  function. The left ventricle demonstrates global hypokinesis. The left  ventricular internal cavity size was  moderately dilated. Left ventricular diastolic parameters are  indeterminate. Elevated left ventricular end-diastolic pressure.   2. Right ventricular systolic function is severely reduced. The right  ventricular size is normal. There is mildly elevated pulmonary artery  systolic pressure. The estimated right ventricular systolic pressure is  40.0 mmHg.   3. Left atrial size was moderately dilated.   4. Right atrial size was moderately dilated.   5. The mitral valve is normal in structure. Trivial mitral valve  regurgitation. No evidence of mitral stenosis.   6. The tricuspid valve is abnormal. Tricuspid valve regurgitation is  moderate.   7. The aortic valve was not well visualized. Aortic valve  regurgitation  is not visualized. No aortic stenosis is present.   8. Pulmonic valve regurgitation not assessed.   9. The inferior vena cava is normal in size with greater than 50%  respiratory variability, suggesting right atrial pressure of 3 mmHg.     Physical Exam .    VS:  BP 122/75 (BP Location: Right Arm)   Pulse 62   Temp 98.1 F (36.7 C) (Oral)   Resp 18   Ht 5' 6 (1.676 m)   Wt 71.6 kg   SpO2 96%   BMI 25.48 kg/m  , BMI Body mass index is 25.48 kg/m. GENERAL:  Well appearing HEENT: Pupils equal round and reactive, fundi not visualized, oral mucosa unremarkable NECK:  No jugular venous distention, waveform within normal limits, carotid upstroke brisk and symmetric, no bruits, no thyromegaly LUNGS:  Clear to auscultation bilaterally HEART: RRR.  PMI not displaced or sustained,S1 and S2 within normal limits, no S3, no S4, no clicks, no rubs, no murmurs ABD:  Flat, positive bowel sounds normal in frequency in pitch, no bruits, no rebound, no guarding, no midline pulsatile mass, no hepatomegaly, no splenomegaly EXT:  2 plus pulses throughout, no edema, no cyanosis no clubbing SKIN:  No rashes no nodules NEURO:  Cranial nerves II through XII grossly intact, motor grossly intact throughout PSYCH:  Cognitively intact,  oriented to person place and time   Assessment & Plan .     59F admitted with new onset acute systolic heart failure and new atrial fibrillation.  # Acute systolic and diastolic HF: # Hypotension: LVEF this admission 20-25%.  This is new.  She now appears to be euvolemic.  Given her reduced systolic function and new onset atrial fibrillation she underwent TEE/DCCV.   Initially successful but transitioned back to sinus rhythm.  She became hypotensive after starting Entresto .  Will start losartan  12.5mg  daily.  HR too low for beta blockers.  Now on Farxiga .  She will need an echocardiogram in 3 months.  If systolic function remains reduced to recommend ischemic  evaluation.  # Persistent atrial fibrillation:  Continue Eliquis .  Recurrent atrial fibrillation after DCCV .  Holding carvedilol  as above.  Given her underlying ILD, she isn't a good candidate for amiodarone.  She has been seen by EP and is now on dofetilide .  # Hyperlipidemia: Statin intolerant.  Continue Zetia .    For questions or updates, please contact [facility] Please consult [email or web address] for contact info under        Signed, Annabella Scarce, MD

## 2023-12-07 NOTE — Assessment & Plan Note (Addendum)
 CTPE negative for PE. TEE demonstrated EF <20%, LV with severely decreased function, LV global hypokinesis, RA severely dilated, moderate MVR, mild-mod TVR, trivial AVR, and mild G2 layered plaque involving the descending aorta.  Possible tachycardia mediated cardiomyopathy per EP.  Given 1 dose of 40 mg IV Lasix  2/1; euvolemic on exam today.  Cardiology would like to start Entresto  24-26 mg BID, Farxiga  10 mg daily and continue Coreg  6.25 mg BID, but discontinued Hydralazine .  [employer] grant approved and will cover the cost of Entresto  and Farxiga .  BP stabilized this AM. - Holding Entresto  24-26 mg BID, Coreg  6.25 mg BID, Spironolactone  12.5 daily - Cardiology started Losartan  12.5 mg daily and continued Farxiga  10 mg daily - Restart GDMT as able, BP permitting - Daily weights  - Strict I/O - Monitor volume status, diurese as appropriate - AM BMP and Mag - Repeat echo in 3 months, if systolic fxn remains reduced may require ischemic eval; will address in DC summary

## 2023-12-07 NOTE — Assessment & Plan Note (Signed)
>>  ASSESSMENT AND PLAN FOR LONGSTANDING PERSISTENT ATRIAL FIBRILLATION (HCC) WRITTEN ON 12/07/2023  4:30 PM BY GOMES, ADRIANA, DO  S/p TEE/Cardioversion which was successful, but has had PAF, mostly rate controlled with episodes of RVR.  Transitioned from heparin  to Eliquis .  Coreg  is currently still being held with her HR being in the low 60s or less.  Cardiology started Tikosyn  since she isn't a good candidate for amio with her ILD. - Cardiology and EP consulted, appreciate recommendations - Holding Coreg  in setting of hypotension, MAPs have stabilized, restart as able - Continue Tikosyn  125 mcg BID - Replete electrolyte goals: K>4 and Mag>2 - Continue Eliquis  - Family is amenable to helping cover medication costs

## 2023-12-07 NOTE — Progress Notes (Signed)
   Heart Failure Stewardship Pharmacist Progress Note   PCP: Lonnie Earnest, MD PCP-Cardiologist: Vina Gull, MD    HPI:  78 yo F with PMH of CHF, NICM, aortic atherosclerosis, COPD, GERD, prediabetes, and pulmonary fibrosis.   Presented to the ED on 1/31 with shortness of breath, orthopnea/PND, dizziness, and LE edema for 2 weeks. In the ED, was hypertensive, tachycardic, and noted to have 2+ pitting edema. BNP 495 and troponins flat. CXR with cardiomegaly without congestive failure. CTA negative for PE. Given 1 dose of IV lasix  40 mg. Was also noted to be in afib with episodes of RVR. Cardiology consulted. ECHO 2/1 showed LVEF reduced to 20-25% (was 50-55% 01/2023), global hypokinesis, RV severely reduced, mildly elevated PA pressure.   S/p TEE/DCCV on 2/3. LVEF 19%. Unfortunately, has been in and out of afib since. EP now consulted for further options.   Denies shortness of breath. No LEE. Denies lightheadedness or dizziness. Wants to be discharged.   Current HF Medications: SGLT2i: Farxiga  10 mg daily  Prior to admission HF Medications: Diuretic: furosemide  20 mg daily MWF Beta blocker: carvedilol  3.125 mg BID MRA: spironolactone  12.5 mg daily  Pertinent Lab Values: Serum creatinine 0.95, BUN 13, Potassium 4.4, Sodium 136, BNP 495.9, Magnesium  2.3, A1c 6.0   Vital Signs: Weight: 157 lbs (admission weight: 169 lbs) Blood pressure: 120-140/70s  Heart rate: 50-60s I/O: net -2.5L yesterday; net -6.3L since admission  Medication Assistance / Insurance Benefits Check: Does the patient have prescription insurance?  Yes Type of insurance plan: UHC Medicare  Does the patient qualify for medication assistance through manufacturers or grants?   Yes Eligible grants and/or patient assistance programs: may be eligible for [employer] grant Medication assistance applications in progress: [employer]well grant  Medication assistance applications approved: [employer]well grant  Approved medication  assistance renewals will be completed by: [facility]  Outpatient Pharmacy:  Prior to admission outpatient [facility] Is the patient willing to use [facility] TOC pharmacy at discharge? Yes Is the patient willing to transition their outpatient pharmacy to utilize a [facility] outpatient pharmacy?   No    Assessment: 1. Acute on chronic systolic CHF (LVEF 20-25%), due to NICM. NYHA class II symptoms. - Does not appear volume overloaded on exam. Strict I/Os and daily weights.  - Holding carvedilol , Entresto , and spironolactone  with hypotensive episodes on 2/4 and with persistent bradycardia. BP improved. Consider restarting spironolactone  12.5 mg daily. - Continue Farxiga  10 mg daily  Plan: 1) Medication changes recommended at this time: - Start spironolactone  12.5 mg daily  2) Patient assistance: - Has $266 deductible left on her insurance plan - Entresto  copay after deductible met $47 Farxiga /Jardiance copay after deductible met $47 - Patient reports she is unable to afford her medications. [employer] grant approved and will cover the cost of her Entresto  and Farxiga . Information provided to the patient and will be given to the pharmacy when scripts are received. Her kids usually pick up her meds from the pharmacy.  3)  Education  - Initial education complete - Full education to be completed prior to discharge  Duwaine Plant, PharmD, BCPS Heart Failure Stewardship Pharmacist Phone [phone]

## 2023-12-08 ENCOUNTER — Other Ambulatory Visit (HOSPITAL_COMMUNITY): Payer: Self-pay

## 2023-12-08 ENCOUNTER — Other Ambulatory Visit: Payer: Self-pay

## 2023-12-08 DIAGNOSIS — I4891 Unspecified atrial fibrillation: Secondary | ICD-10-CM | POA: Diagnosis not present

## 2023-12-08 DIAGNOSIS — I5041 Acute combined systolic (congestive) and diastolic (congestive) heart failure: Secondary | ICD-10-CM | POA: Diagnosis not present

## 2023-12-08 DIAGNOSIS — I959 Hypotension, unspecified: Secondary | ICD-10-CM | POA: Diagnosis not present

## 2023-12-08 DIAGNOSIS — I509 Heart failure, unspecified: Secondary | ICD-10-CM | POA: Diagnosis not present

## 2023-12-08 LAB — BASIC METABOLIC PANEL
Anion gap: 10 (ref 5–15)
BUN: 15 mg/dL (ref 8–23)
CO2: 25 mmol/L (ref 22–32)
Calcium: 9 mg/dL (ref 8.9–10.3)
Chloride: 98 mmol/L (ref 98–111)
Creatinine, Ser: 0.92 mg/dL (ref 0.44–1.00)
GFR, Estimated: >60 mL/min (ref >60)
Glucose, Bld: 80 mg/dL (ref 70–99)
Potassium: 4.2 mmol/L (ref 3.5–5.1)
Sodium: 133 mmol/L — ABNORMAL LOW (ref 135–145)

## 2023-12-08 LAB — MAGNESIUM: Magnesium: 2.2 mg/dL (ref 1.7–2.4)

## 2023-12-08 MED ORDER — FERROUS SULFATE 325 (65 FE) MG PO TABS
325.0000 mg | ORAL_TABLET | ORAL | 0 refills | Status: DC
  Filled 2023-12-08: qty 30, 60d supply, fill #0

## 2023-12-08 MED ORDER — FUROSEMIDE 20 MG PO TABS
ORAL_TABLET | ORAL | 1 refills | Status: DC
  Filled 2023-12-08: qty 45, 90d supply, fill #0

## 2023-12-08 MED ORDER — DAPAGLIFLOZIN PROPANEDIOL 10 MG PO TABS
10.0000 mg | ORAL_TABLET | Freq: Every day | ORAL | 0 refills | Status: DC
  Filled 2023-12-08: qty 30, 30d supply, fill #0

## 2023-12-08 MED ORDER — LOSARTAN POTASSIUM 25 MG PO TABS
12.5000 mg | ORAL_TABLET | Freq: Every day | ORAL | 0 refills | Status: DC
  Filled 2023-12-08: qty 30, 60d supply, fill #0

## 2023-12-08 MED ORDER — APIXABAN 5 MG PO TABS
5.0000 mg | ORAL_TABLET | Freq: Two times a day (BID) | ORAL | 0 refills | Status: DC
  Filled 2023-12-08: qty 60, 30d supply, fill #0

## 2023-12-08 MED ORDER — DOFETILIDE 125 MCG PO CAPS
125.0000 ug | ORAL_CAPSULE | Freq: Two times a day (BID) | ORAL | 0 refills | Status: DC
  Filled 2023-12-08: qty 60, 30d supply, fill #0

## 2023-12-08 NOTE — TOC Transition Note (Addendum)
 Transition of Care [facility]) - Discharge Note   Patient Details  Name: Donna Herrera MRN: [id number] Date of Birth: [birth year]  Transition of Care [facility]) CM/SW Contact:  Waddell Barnie Rama, RN Phone Number: 12/08/2023, 11:01 AM   Clinical Narrative:    For dc today, she has transport, NCM contacted grand daughter, she states she will need to call this NCM back because she was on her way to work and she will need to call her aunt to see if she can transport patient home.  TOC to fill meds.  Needs EKG prior to dc per staff RN.  Transport will be here in an hour.   Final next level of care: Home w Home Health Services Barriers to Discharge: Continued Medical Work up   Patient Goals and CMS Choice Patient states their goals for this hospitalization and ongoing recovery are:: return home CMS Medicare.gov Compare Post Acute Care list provided to:: Patient Choice offered to / list presented to : Patient      Discharge Placement                       Discharge Plan and Services Additional resources added to the After Visit Summary for   In-house Referral: NA Discharge Planning Services: CM Consult Post Acute Care Choice: Home Health          DME Arranged: N/A DME Agency: NA       HH Arranged: PT, OT HH Agency: [facility] Date [facility] Agency Contacted: 12/06/23 Time HH Agency Contacted: 1629 Representative spoke with at [facility] Agency: Darleene  Social Drivers of Health (SDOH) Interventions SDOH Screenings   Food Insecurity: No Food Insecurity (12/03/2023)  Housing: Low Risk  (12/05/2023)  Transportation Needs: No Transportation Needs (12/05/2023)  Utilities: Not At Risk (12/03/2023)  Alcohol Screen: Low Risk  (12/05/2023)  Depression (PHQ2-9): High Risk (12/01/2023)  Financial Resource Strain: Low Risk  (12/05/2023)  Physical Activity: Insufficiently Active (04/28/2023)  Social Connections: Moderately Isolated (12/03/2023)  Stress: No Stress Concern Present (04/28/2023)   Tobacco Use: Medium Risk (12/04/2023)     Readmission Risk Interventions     No data to display

## 2023-12-08 NOTE — Assessment & Plan Note (Addendum)
 CTPE negative for PE. TEE demonstrated EF <20%, LV with severely decreased function, LV global hypokinesis, RA severely dilated, moderate MVR, mild-mod TVR, trivial AVR, and mild G2 layered plaque involving the descending aorta.  Possible tachycardia mediated cardiomyopathy per EP.  Given 1 dose of 40 mg IV Lasix  2/1; euvolemic on exam today.  Cardiology would like to start GDMT as able: Entresto  24-26 mg BID, Farxiga  10 mg daily and continue Coreg  6.25 mg BID.  Due to low BPs, Entresto , Coreg , and Hydralazine  was discontinued.  Cardiology recommended starting Losartan  and Farxiga  at this time, additional GDMT as able. [employer] grant approved and will cover the cost of Entresto  and Farxiga .  BP stabilized this AM. - Holding Entresto  24-26 mg BID, Coreg  6.25 mg BID, Spironolactone  12.5 daily - Continue Losartan  12.5 mg daily and continued Farxiga  10 mg daily - Restart GDMT as able, BP permitting - Daily weights  - Strict I/O - Monitor volume status, diurese as appropriate - Repeat Echo in 3 months, if systolic fxn remains reduced may require ischemic eval; will address in DC summary

## 2023-12-08 NOTE — Progress Notes (Signed)
   Rounding Note    Patient Name: Donna Herrera Date of Encounter: 12/08/2023  [facility] Cardiologist: Vina Gull, MD   Subjective   No acute events overnight.  Eating breakfast this morning.  Feels well.  Vital Signs    Vitals:   12/07/23 1150 12/07/23 2036 12/08/23 0057 12/08/23 0504  BP: 122/67 117/68 (!) 143/87 (!) 124/98  Pulse: 61 66 60 66  Resp: 18 18  17   Temp: 98.1 F (36.7 C) 97.7 F (36.5 C) 97.8 F (36.6 C) 97.8 F (36.6 C)  TempSrc: Oral Oral Oral Oral  SpO2: 96% 99% 98% 94%  Weight:    74.8 kg  Height:        Intake/Output Summary (Last 24 hours) at 12/08/2023 9187 Last data filed at 12/08/2023 0507 Gross per 24 hour  Intake --  Output 2100 ml  Net -2100 ml      12/08/2023    5:04 AM 12/07/2023   12:30 AM 12/06/2023    6:15 AM  Last 3 Weights  Weight (lbs) 164 lb 14.5 oz 157 lb 13.6 oz 162 lb 14.7 oz  Weight (kg) 74.8 kg 71.6 kg 73.9 kg       ECG    Sinus.  QTc manually measured at 480 to 490 ms.- Personally Reviewed  Physical Exam    GEN: No acute distress.   Cardiac: RRR, no murmurs, rubs, or gallops.  Respiratory: Clear to auscultation bilaterally. Psych: Normal affect   Assessment & Plan    #Persistent atrial fibrillation #High risk drug monitoring-Tikosyn  Maintaining sinus rhythm QTc remains acceptable Receives loading dose 6 of 6 this morning.  If EKG post dose #6 shows stable QTc, would be acceptable for discharge from an EP perspective.     Ole T. Cindie, MD, [facility], [facility] Cardiac Electrophysiology

## 2023-12-08 NOTE — Discharge Summary (Addendum)
 Family Medicine Teaching [facility] Discharge Summary  Patient name: Donna Herrera Medical record number: [id number] Date of birth: [birth year] Age: 78 y.o. Gender: female Date of Admission: 12/01/2023  Date of Discharge: 12/08/23  Admitting Physician: Laurier Hugger, MD  Primary Care Provider: Lonnie Earnest, MD Consultants: Cardiology, EP  Indication for Hospitalization: New onset A-fib w/ RVR  Discharge Diagnoses/Problem List:  Principal Problem for Admission: New onset A-fib w/ RVR Other Problems addressed during stay:  Principal Problem:   New onset a-fib (HCC) Active Problems:   Acute on chronic congestive heart failure (HCC)   Essential hypertension   Acute systolic heart failure (HCC)   Right hip pain   Bilateral leg pain   Normocytic anemia   AKI (acute kidney injury) (HCC)   Hypotension due to hypovolemia  Brief Hospital Course:  Donna Herrera is a 78 y.o.female with a history of Aortic atherosclerosis, high fall risk, COPD? Cervical DDD, DJD, GERD, CHF, NICM, ambulates with crutches, vit D deficiency, prediabetes, OA, pulmonary fibrosis, history of hip replacement who was admitted to the [facility] Medicine Teaching Service at [facility] for new onset A-fib with worsening SOB . Her hospital course is detailed below:  New onset A-fib with RVR New onset A-fib with RVR noted on arrival to ED with dyspnea. Patient was started on heparin  and transitioned to a Eliquis  prior to discharge. CTAPE negative for PE, patchy groundglass opacities in both lungs likely air trapping, cardiomegaly, aortic atherosclerosis advanced arthritis in both shoulders with moderate effusions. DVT U/S negative. TSH 1.395.  Restarted home Coreg  however due to episodes of bradycardia discontinued during hospitalization.  Cardioversion was initally successful, but then she reverted back to A-fib with good rate control with episodes of occasional RVR.  EP consulted and recommended starting  Tikosyn  to help control rhythm.  Acute systolic heart failure Echo demonstrated EF 20-25%, global hypokinesis, trivial MVR, and IVC variability >50%.  TEE demonstrated EF <20%, LV with severely decreased function, LV global hypokinesis, RA severely dilated, moderate MVR, mild-mod TVR, trivial AVR, and mild G2 layered plaque involving the descending aorta.  GDMT added as able due to soft blood pressures.  Patient was started on losartan  12.5 mg daily and Farxiga  10 mg daily.  Additional GDMT BP permitting.  HTN Discontinued hydralazine  and started Entresto  per cardiology, however due to soft BPs this was held.  Cardiology restarted losartan  25 mg.  H/o Right Hip Replacement  Right Hip Pain  XR right hip showed old fusion of L hip, R hip arthroplasty with acetabular protrusion, superior subluxation, and bone resorption around the proximal femur.  PT/OT recommendations Home health PT/OT.  Pain regimen on discharge continue home Oxycodone  5 mg PRN, Duloxetine  30 mg daily, and Gabapentin  300 mg nightly.   Other chronic conditions were medically managed with home medications and formulary alternatives as necessary: Gout: Continued allopurinol  and added on colchicine  during hospitalization due to pain.  Chronic Pain: Added on Oxycodone  2.5-5mg  q6h PRN for moderate/severe pain and Lidocaine  patch q24h. Anxiety/Depression: Continued Cymbalta . Vitamin D  deficiency: Contine D3 5000u daily HLD: Continue Zetia  10 mg daily GERD: Continue Famotidine  20 mg daily PRN Neuropathy: Continue Gabapentin  300 mg nightly  PCP Follow-up Recommendations: Ensure patient has discontinued ASA 81 mg daily. Ensure follow-up with EP/cardiology for A-fib and CHF. Add GDMT as able, BP permitting.  Follow up with pulmonology - consider high res CT to further quantify pulmonary fibrosis and PFT for COPD diagnosis Echo in 3 months. If systolic function remains reduced to recommend ischemic  evaluation.  Could consider restarting  Coreg  3.125 mg if her HR are stable, with close follow up. In the setting of Low EF recommended should not restart or increase dose per pharmacy.  Disposition: Home  Discharge Condition: Stable  Discharge Exam:  Vitals:   12/08/23 0504 12/08/23 0754  BP: (!) 124/98 115/87  Pulse: 66 72  Resp: 17 16  Temp: 97.8 F (36.6 C) 97.8 F (36.6 C)  SpO2: 94% 94%   General: Resting comfortably in bed in NAD.  Cardiovascular: RRR. No M/R/G  Respiratory: CTAB. Normal WOB on RA. Abdomen: Soft, non-tender, non-distended. Normoactive bowel sounds.  Extremities: R hip tender chronically. Shin/ankle pain improved. No BLE edema.   Significant Procedures: Echo, TEE/Cardioversion  Significant Labs and Imaging:  Recent Labs  Lab 12/07/23 0256  WBC 6.4  HGB 12.1  HCT 38.7  PLT 287   Recent Labs  Lab 12/07/23 0256 12/08/23 0233  NA 136 133*  K 4.4 4.2  CL 99 98  CO2 26 25  GLUCOSE 109* 80  BUN 13 15  CREATININE 0.95 0.92  CALCIUM  9.3 9.0  MG 2.3 2.2    CXR: Cardiomegaly without congestive failure.  R hip XR: Previous right hip arthroplasty with acetabular protrusio and superior subluxation as well as bone resorption around the proximal femur. Appearances are unchanged since prior study. Old fusion of the left hip. No acute bony abnormalities.  CTPE:  1. Negative for acute pulmonary embolism. 2. Patchy ground-glass opacities in both lungs. I favor this to represent air trapping on expiratory phase imaging. Atypical infection or edema could appear similarly. 3. Cardiomegaly. 4.  Aortic Atherosclerosis (ICD10-I70.0). 5. 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  Echo: LVEF 20-25%, LV global hypokinesis, trivial MVR, and IVC variability >50%.   TEE: LVEF <20%, LV with 3D volume is 19%. LV global hypokinesis. Moderate MVR. G2 layered plaque involving the descending aorta.   L foot XR 1. Moderate to high-grade hallux valgus, mildly worsened from prior. 2.  Moderate to severe great toe metatarsophalangeal and midfoot osteoarthritis.  R foot XR 1. Moderate hallux valgus, mildly worsened from prior. 2. Worsened severe great toe metatarsophalangeal joint osteoarthritis. 3. Moderate to severe third interphalangeal joint osteoarthritis. 4. Moderate to severe second and third tarsometatarsal osteoarthritis.  Results/Tests Pending at Time of Discharge: none  Discharge Medications:  Allergies as of 12/08/2023       Reactions   Crab [shellfish Allergy]    Itching to lips   Morphine And Codeine Itching, Swelling   Atorvastatin  Other (See Comments)   achiness   Rosuvastatin  Other (See Comments)   Muscle pain and achiness        Medication List     PAUSE taking these medications    carvedilol  3.125 MG tablet Wait to take this until your doctor or other care provider tells you to start again. Commonly known as: COREG  TAKE 1 TABLET BY MOUTH 2 TIMES DAILY.       STOP taking these medications    aspirin  81 MG tablet   hydrALAZINE  50 MG tablet Commonly known as: APRESOLINE    spironolactone  25 MG tablet Commonly known as: ALDACTONE        TAKE these medications    acetaminophen  500 MG tablet Commonly known as: TYLENOL  Take 1,000 mg by mouth daily as needed for mild pain or moderate pain.   albuterol  108 (90 Base) MCG/ACT inhaler Commonly known as: VENTOLIN  HFA Inhale 1-2 puffs into the lungs every 6 (six) hours as needed for  wheezing or shortness of breath.   allopurinol  100 MG tablet Commonly known as: ZYLOPRIM  Take 1 tablet (100 mg total) by mouth daily.   apixaban  5 MG Tabs tablet Commonly known as: ELIQUIS  Take 1 tablet (5 mg total) by mouth 2 (two) times daily.   baclofen  10 MG tablet Commonly known as: LIORESAL  TAKE 1 TABLET 3 TIMES DAILY. WILL REFILL FOR 2 MONTH SUPPLY, PLEASE SCHEDULE APPOINTMENT What changed: See the new instructions.   colchicine  0.6 MG tablet TAKE 1 TABLET BY MOUTH EVERY DAY    dapagliflozin  propanediol 10 MG Tabs tablet Commonly known as: FARXIGA  Take 1 tablet (10 mg total) by mouth daily. Start taking on: December 09, 2023   dofetilide  125 MCG capsule Commonly known as: TIKOSYN  Take 1 capsule (125 mcg total) by mouth 2 (two) times daily.   DULoxetine  30 MG capsule Commonly known as: CYMBALTA  Take 1 capsule (30 mg total) by mouth daily. What changed: when to take this   ezetimibe  10 MG tablet Commonly known as: ZETIA  TAKE 1 TABLET BY MOUTH EVERY DAY What changed: when to take this   famotidine  20 MG tablet Commonly known as: PEPCID  TAKE 1 TABLET (20 MG TOTAL) BY MOUTH DAILY AS NEEDED FOR HEARTBURN OR INDIGESTION.   ferrous sulfate  325 (65 FE) MG tablet Take 1 tablet (325 mg total) by mouth every other day. Start taking on: December 09, 2023   furosemide  20 MG tablet Commonly known as: LASIX  TAKE 20 MG TABLET AS NEEDED TWICE PER WEEK FOR INCREASED SWELLING OR WEIGHT GAIN >5 POUNDS IN A WEEK What changed: additional instructions   gabapentin  300 MG capsule Commonly known as: NEURONTIN  Take 1 capsule (300 mg total) by mouth at bedtime as needed. TAKE 1 CAP BY MOUTH EVERYDAY AT BEDTIME.   ICY HOT BACK EX Apply 1 Application topically 2 (two) times daily as needed (lower leg pain, knee pain).   losartan  25 MG tablet Commonly known as: COZAAR  Take 0.5 tablets (12.5 mg total) by mouth daily. Start taking on: December 09, 2023   multivitamin with minerals Tabs tablet Take by mouth 2 (two) times a week.   naloxone 4 MG/0.1ML Liqd nasal spray kit Commonly known as: NARCAN SMARTSIG:Both Nares   oxyCODONE  5 MG immediate release tablet Commonly known as: Oxy IR/ROXICODONE  Take 5 mg by mouth every 6 (six) hours as needed for moderate pain or severe pain.   Vitamin D  (Ergocalciferol ) 1.25 MG (50000 UNIT) Caps capsule Commonly known as: DRISDOL  Take 50,000 Units by mouth once a week. Take on Wednesday        Discharge Instructions: Please  refer to Patient Instructions section of EMR for full details.  Patient was counseled important signs and symptoms that should prompt return to medical care, changes in medications, dietary instructions, activity restrictions, and follow up appointments.   Follow-Up Appointments:  Follow-up Information     [facility]. Go in 10 day(s).   Specialty: Cardiology Why: Hospital follow up 12/15/2023 @ 10 am PLEASE bring a current medication ;list to appointment Free valet parking , Entrance C, off [employer] information: [address] [place]  [phone]        Care, [facility] Follow up.   Specialty: Home Health Services Why: Agency willl call you to set up apt times Contact information: [address] [place] [address] [phone]         [facility] -  A Dept Of [place] Follow up on 12/12/2023.   Specialty: Family Medicine Why: at 9:10 AM Contact information: [address] [place]  [phone] Additional information: [address]  [place], [place] [address]                Janna Ferrier, DO 12/08/2023, 1:05 PM PGY-1, [facility]

## 2023-12-08 NOTE — Progress Notes (Signed)
 Daily Progress Note Intern Pager: [phone]  Patient name: Donna Herrera Medical record number: [id number] Date of birth: June 24, 1946 Age: 78 y.o. Gender: female  Primary Care Provider: Lonnie Earnest, MD Consultants: Cardiology, EP  Code Status: Full Code   Pt Overview and Major Events to Date:  1/31: Admitted 2/3: TEE/Cardioversion > converted to NSR > PAF > spontaneous NSR  Tikosyn   Assessment and Plan: Donna Herrera is 78 yo female w/ past medical history of aortic atherosclerosis, cervical DDD, DJD, GERD, CHF, Vit D deficiency, prediabetes, OA, pulmonary fibrosis, high risk fall presenting with new onset A-fib.  Transitioned to Elqiuis, however there is an ongoing discussion at this time to determine the most cost-effective/affordable AC option for her and her family.  S/p TEE/cardioversion which was successful.  Has had multiple episodes of paroxysmal A-fib.  Spontaneously converted back to NSR while ambulating and has stayed in NSR since.    QTc remains prolonged to 509 this AM with Tikosyn  lowered to 125 mcg BID yesterday evening.  EP recommends staying the course and continuing Tikosyn  as scheduled since manual Qtc was slightly prolonged 480-490.   TOC to provide medications for one month prior to discharge at a cost-effective price for the family who is amenable to helping pay for HF and A-fib medications.  [employer] grant approved and will cover the cost of her Entresto  and Farxiga .   Assessment & Plan New onset a-fib [facility]) S/p TEE/Cardioversion which was successful, but has had PAF, mostly rate controlled with episodes of RVR.  Transitioned from heparin  to Eliquis .  Coreg  is currently still being held with her HR being in the low 60s or less.  Cardiology started Tikosyn  since she isn't a good candidate for amio with her ILD. - Cardiology and EP consulted, provided recommendations and believes patient is ready for discharge - Holding Coreg  in setting of  hypotension and low EF, MAPs have stabilized, restart as able - Continue Tikosyn  125 mcg BID, loading dose completed - Replete electrolyte goals: K>4 and Mag>2 - Continue Eliquis  5 mg BID - Family is amenable to helping cover medication costs Acute systolic heart failure (HCC) CTPE negative for PE. TEE demonstrated EF <20%, LV with severely decreased function, LV global hypokinesis, RA severely dilated, moderate MVR, mild-mod TVR, trivial AVR, and mild G2 layered plaque involving the descending aorta.  Possible tachycardia mediated cardiomyopathy per EP.  Given 1 dose of 40 mg IV Lasix  2/1; euvolemic on exam today.  Cardiology would like to start GDMT as able: Entresto  24-26 mg BID, Farxiga  10 mg daily and continue Coreg  6.25 mg BID.  Due to low BPs, Entresto , Coreg , and Hydralazine  was discontinued.  Cardiology recommended starting Losartan  and Farxiga  at this time, additional GDMT as able. [employer] grant approved and will cover the cost of Entresto  and Farxiga .  BP stabilized this AM. - Holding Entresto  24-26 mg BID, Coreg  6.25 mg BID, Spironolactone  12.5 daily - Continue Losartan  12.5 mg daily and continued Farxiga  10 mg daily - Restart GDMT as able, BP permitting - Daily weights  - Strict I/O - Monitor volume status, diurese as appropriate - Repeat Echo in 3 months, if systolic fxn remains reduced may require ischemic eval; will address in DC summary Normocytic anemia New onset over the past 6 months, no sources of bleeding.  Hgb stable 12.1.  Last colonoscopy in 2021, no further recommended screening per GI.  Iron studies with slightly low sat ratio. - Ferrous sulfate  325 mg every other day Essential hypertension BP  in the 120-140s/50-80s. On Coreg  BID, Entresto  BID, and Spironolactone  daily, will hold for now. BPs and MAPs stabilized this AM. - Monitor VS - Cardiology started Losartan  12.5 mg today - Restart medications as able, BP permitting Right hip pain S/p R total hip replacement.  Has a hx of SCFE and DJD. Complains of R hip pain to light palpation. Imaging revealed old fusion of L hip, R hip arthroplasty with acetabular protrusion & superior subluxation & bone resorption around the proximal femur. Follows orthopedics outpatient. R hip XR with no acute changes. - Pain regimen: Lidocaine  patch q24h. Tylenol  for mild pain and Oxycodone  2.5-5 mg for moderate/severe pain, K pad as tolerated, consider Voltaren  gel  - PT/OT - OOB as tolerated  AKI (acute kidney injury) (HCC) Cr bumped from 0.8 > 1.10. Has improved since, Cr 0.95 today.  - Encourage PO ad lib Bilateral leg pain Chronic, likely in the setting of dependent edema. DVT U/S negative for acute DVT.  Bilateral foot pain R>L.  TTP palpation and with walking.  Concern for gout with hx, however uric acid 5.0. L/R foot XR demonstrated severe OA. - Continue Colchicine  0.6 mg daily - Continue to monitor  Chronic and Stable Problems:  COPD: Albuterol  prn  Gout: Continue home allopurinol . Chronic Right Hip Pain: Takes Oxycodone  5 mg q6h PRN at home. Added on Oxycodone  2.5-5mg  q6h PRN for moderate/severe pain and Lidocaine  patch q24h. Anxiety/Depression: Continue Cymbalta  30 mg daily Vitamin D  deficiency: Contine D3 5000u daily HLD: Continue Zetia  10 mg daily.  Lipid panel repeated 4 weeks ago demonstrated LDL 138. GERD: Continue Famotidine  20 mg daily PRN Neuropathy: Continue Gabapentin  300 mg nightly Prediabetes: A1c 4 weeks ago 6.0.  Bilateral leg pain: Chronic, likely in the setting of dependent edema. DVT U/S negative for acute DVT.  FEN/GI: Heart healthy  PPx: Eliquis   Dispo: Home pending stability on Tikosyn   Subjective:  Patient is doing well this morning with no concerns. We discussed her medications further and all questions were answered.   Objective: Temp:  [97.7 F (36.5 C)-97.8 F (36.6 C)] 97.8 F (36.6 C) (02/07 0754) Pulse Rate:  [60-72] 72 (02/07 0754) Resp:  [16-18] 16 (02/07 0754) BP:  (115-143)/(68-98) 115/87 (02/07 0754) SpO2:  [94 %-99 %] 94 % (02/07 0754) Weight:  [74.8 kg] 74.8 kg (02/07 0504) Physical Exam: General: Resting comfortably in bed in NAD.  Cardiovascular: RRR. No M/R/G  Respiratory: CTAB. Normal WOB on RA. Abdomen: Soft, non-tender, non-distended. Normoactive bowel sounds.  Extremities: R hip tender chronically. Shin/ankle pain improved. No BLE edema.   Laboratory: Most recent CBC Lab Results  Component Value Date   WBC 6.4 12/07/2023   HGB 12.1 12/07/2023   HCT 38.7 12/07/2023   MCV 88.8 12/07/2023   PLT 287 12/07/2023   Most recent BMP    Latest Ref Rng & Units 12/08/2023    2:33 AM  BMP  Glucose 70 - 99 mg/dL 80   BUN 8 - 23 mg/dL 15   Creatinine 9.55 - 1.00 mg/dL 9.07   Sodium 864 - 854 mmol/L 133   Potassium 3.5 - 5.1 mmol/L 4.2   Chloride 98 - 111 mmol/L 98   CO2 22 - 32 mmol/L 25   Calcium  8.9 - 10.3 mg/dL 9.0    Mag: 2.2  Imaging/Diagnostic Tests: No new imaging.  Janna Ferrier, DO 12/08/2023, 12:46 PM  PGY-1, [facility] Family Medicine FPTS Intern pager: [phone], text pages welcome Secure chat group [facility] Teaching Service

## 2023-12-08 NOTE — Assessment & Plan Note (Addendum)
 BP in the 120-140s/50-80s. On Coreg  BID, Entresto  BID, and Spironolactone  daily, will hold for now. BPs and MAPs stabilized this AM. - Monitor VS - Cardiology started Losartan  12.5 mg today - Restart medications as able, BP permitting

## 2023-12-08 NOTE — Progress Notes (Signed)
 PT Cancellation Note  Patient Details Name: Donna Herrera MRN: [id number] DOB: 10-07-1946   Cancelled Treatment:    Reason Eval/Treat Not Completed: (P) Patient declined, no reason specified;Pain limiting ability to participate. Attempted session 2x this morning with pt declining the first time due to abdominal pain and the pt declining the second time any need for acute PT or education before planned d/c later today. If pt ends up staying in the hospital, will plan to follow-up another day.   Theo Ferretti, PT, DPT Acute Rehabilitation Services  Office: [phone]    Theo CHRISTELLA Ferretti 12/08/2023, 12:33 PM

## 2023-12-08 NOTE — Progress Notes (Signed)
 Heart Failure Stewardship Pharmacist Progress Note   PCP: Lonnie Earnest, MD PCP-Cardiologist: Vina Gull, MD    HPI:  78 yo F with PMH of CHF, NICM, aortic atherosclerosis, COPD, GERD, prediabetes, and pulmonary fibrosis.   Presented to the ED on 1/31 with shortness of breath, orthopnea/PND, dizziness, and LE edema for 2 weeks. In the ED, was hypertensive, tachycardic, and noted to have 2+ pitting edema. BNP 495 and troponins flat. CXR with cardiomegaly without congestive failure. CTA negative for PE. Given 1 dose of IV lasix  40 mg. Was also noted to be in afib with episodes of RVR. Cardiology consulted. ECHO 2/1 showed LVEF reduced to 20-25% (was 50-55% 01/2023), global hypokinesis, RV severely reduced, mildly elevated PA pressure.   S/p TEE/DCCV on 2/3. LVEF 19%. Unfortunately, has been in and out of afib since. EP now consulted for further options. Started on Tikosyn .   Denies shortness of breath. No LEE. Denies lightheadedness or dizziness. Wants to be discharged.   Current HF Medications: ACE/ARB/ARNI: losartan  12.5 mg daily SGLT2i: Farxiga  10 mg daily  Prior to admission HF Medications: Diuretic: furosemide  20 mg daily MWF Beta blocker: carvedilol  3.125 mg BID MRA: spironolactone  12.5 mg daily  Pertinent Lab Values: Serum creatinine 0.92, BUN 15, Potassium 4.2, Sodium 133, BNP 495.9, Magnesium  2.2, A1c 6.0   Vital Signs: Weight: 157 lbs (admission weight: 169 lbs) Blood pressure: 120-140/80s  Heart rate: 50-70s I/O: net -2.1L yesterday; net -8.4L since admission  Medication Assistance / Insurance Benefits Check: Does the patient have prescription insurance?  Yes Type of insurance plan: UHC Medicare  Does the patient qualify for medication assistance through manufacturers or grants?   Yes Eligible grants and/or patient assistance programs: may be eligible for [employer] grant Medication assistance applications in progress: [employer]well grant  Medication assistance  applications approved: [employer]well grant  Approved medication assistance renewals will be completed by: [facility]  Outpatient Pharmacy:  Prior to admission outpatient [facility] Is the patient willing to use [facility] TOC pharmacy at discharge? Yes Is the patient willing to transition their outpatient pharmacy to utilize a [facility] outpatient pharmacy?   No    Assessment: 1. Acute on chronic systolic CHF (LVEF 20-25%), due to NICM. NYHA class II symptoms. - Does not appear volume overloaded on exam. Strict I/Os and daily weights.  - Holding carvedilol , Entresto , and spironolactone  with hypotensive episodes on 2/4 and with persistent bradycardia. BP improved. Consider restarting spironolactone  12.5 mg daily. - Continue losartan  12.5 mg daily - Continue Farxiga  10 mg daily  Plan: 1) Medication changes recommended at this time: - Start spironolactone  12.5 mg daily if BP stable on next check  2) Patient assistance: - Has $266 deductible left on her insurance plan - Entresto  copay after deductible met $47 Farxiga Cindi copay after deductible met $47 - Patient reports she is unable to afford her medications. [employer] grant approved and will cover the cost of her Entresto  and Farxiga . Information provided to the patient and will be given to the pharmacy when scripts are received. Her kids usually pick up her meds from the pharmacy.  3)  Education  - Patient has been educated on current HF medications and potential additions to HF medication regimen - Patient verbalizes understanding that over the next few months, these medication doses may change and more medications may be added to optimize HF regimen - Patient has been educated on basic disease state pathophysiology and goals of therapy   Duwaine Plant, PharmD, BCPS Heart Failure Stewardship Pharmacist  Phone [phone]

## 2023-12-08 NOTE — Assessment & Plan Note (Addendum)
 S/p TEE/Cardioversion which was successful, but has had PAF, mostly rate controlled with episodes of RVR.  Transitioned from heparin  to Eliquis .  Coreg  is currently still being held with her HR being in the low 60s or less.  Cardiology started Tikosyn  since she isn't a good candidate for amio with her ILD. - Cardiology and EP consulted, provided recommendations and believes patient is ready for discharge - Holding Coreg  in setting of hypotension and low EF, MAPs have stabilized, restart as able - Continue Tikosyn  125 mcg BID, loading dose completed - Replete electrolyte goals: K>4 and Mag>2 - Continue Eliquis  5 mg BID - Family is amenable to helping cover medication costs

## 2023-12-08 NOTE — Assessment & Plan Note (Addendum)
 S/p R total hip replacement. Has a hx of SCFE and DJD. Complains of R hip pain to light palpation. Imaging revealed old fusion of L hip, R hip arthroplasty with acetabular protrusion & superior subluxation & bone resorption around the proximal femur. Follows orthopedics outpatient. R hip XR with no acute changes. - Pain regimen: Lidocaine patch q24h. Tylenol for mild pain and Oxycodone 2.5-5 mg for moderate/severe pain, K pad as tolerated, consider Voltaren gel  - PT/OT - OOB as tolerated

## 2023-12-08 NOTE — Assessment & Plan Note (Signed)
>>  ASSESSMENT AND PLAN FOR LONGSTANDING PERSISTENT ATRIAL FIBRILLATION (HCC) WRITTEN ON 12/08/2023 12:47 PM BY GOMES, ADRIANA, DO  S/p TEE/Cardioversion which was successful, but has had PAF, mostly rate controlled with episodes of RVR.  Transitioned from heparin  to Eliquis .  Coreg  is currently still being held with her HR being in the low 60s or less.  Cardiology started Tikosyn  since she isn't a good candidate for amio with her ILD. - Cardiology and EP consulted, provided recommendations and believes patient is ready for discharge - Holding Coreg  in setting of hypotension and low EF, MAPs have stabilized, restart as able - Continue Tikosyn  125 mcg BID, loading dose completed - Replete electrolyte goals: K>4 and Mag>2 - Continue Eliquis  5 mg BID - Family is amenable to helping cover medication costs

## 2023-12-08 NOTE — Assessment & Plan Note (Addendum)
 Cr bumped from 0.8 > 1.10. Has improved since, Cr 0.95 today.  - Encourage PO ad lib

## 2023-12-08 NOTE — Assessment & Plan Note (Addendum)
 New onset over the past 6 months, no sources of bleeding.  Hgb stable 12.1.  Last colonoscopy in 2021, no further recommended screening per GI.  Iron studies with slightly low sat ratio. - Ferrous sulfate  325 mg every other day

## 2023-12-08 NOTE — Progress Notes (Signed)
 Patient Name: Anaia Frith Date of Encounter: 12/08/2023 [facility] Cardiologist: Vina Gull, MD   Interval Summary  .    Feeling well.  Eager to go home.   Vital Signs .    Vitals:   12/07/23 2036 12/08/23 0057 12/08/23 0504 12/08/23 0754  BP: 117/68 (!) 143/87 (!) 124/98 115/87  Pulse: 66 60 66 72  Resp: 18  17 16   Temp: 97.7 F (36.5 C) 97.8 F (36.6 C) 97.8 F (36.6 C) 97.8 F (36.6 C)  TempSrc: Oral Oral Oral Oral  SpO2: 99% 98% 94% 94%  Weight:   74.8 kg   Height:        Intake/Output Summary (Last 24 hours) at 12/08/2023 0943 Last data filed at 12/08/2023 0507 Gross per 24 hour  Intake --  Output 1600 ml  Net -1600 ml      12/08/2023    5:04 AM 12/07/2023   12:30 AM 12/06/2023    6:15 AM  Last 3 Weights  Weight (lbs) 164 lb 14.5 oz 157 lb 13.6 oz 162 lb 14.7 oz  Weight (kg) 74.8 kg 71.6 kg 73.9 kg      Telemetry/ECG    Sinus rhythm/sinus bradycardia. Personally Reviewed  Echo 12/02/23:  1. No evidence of LV thrombus. Left ventricular ejection fraction, by  estimation, is 20 to 25%. The left ventricle has severely decreased  function. The left ventricle demonstrates global hypokinesis. The left  ventricular internal cavity size was  moderately dilated. Left ventricular diastolic parameters are  indeterminate. Elevated left ventricular end-diastolic pressure.   2. Right ventricular systolic function is severely reduced. The right  ventricular size is normal. There is mildly elevated pulmonary artery  systolic pressure. The estimated right ventricular systolic pressure is  40.0 mmHg.   3. Left atrial size was moderately dilated.   4. Right atrial size was moderately dilated.   5. The mitral valve is normal in structure. Trivial mitral valve  regurgitation. No evidence of mitral stenosis.   6. The tricuspid valve is abnormal. Tricuspid valve regurgitation is  moderate.   7. The aortic valve was not well visualized. Aortic valve  regurgitation  is not visualized. No aortic stenosis is present.   8. Pulmonic valve regurgitation not assessed.   9. The inferior vena cava is normal in size with greater than 50%  respiratory variability, suggesting right atrial pressure of 3 mmHg.     Physical Exam .    VS:  BP 115/87 (BP Location: Right Arm)   Pulse 72   Temp 97.8 F (36.6 C) (Oral)   Resp 16   Ht 5' 6 (1.676 m)   Wt 74.8 kg   SpO2 94%   BMI 26.62 kg/m  , BMI Body mass index is 26.62 kg/m. GENERAL:  Well appearing HEENT: Pupils equal round and reactive, fundi not visualized, oral mucosa unremarkable NECK:  No jugular venous distention, waveform within normal limits, carotid upstroke brisk and symmetric, no bruits, no thyromegaly LUNGS:  Clear to auscultation bilaterally HEART: RRR.  PMI not displaced or sustained,S1 and S2 within normal limits, no S3, no S4, no clicks, no rubs, no murmurs ABD:  Flat, positive bowel sounds normal in frequency in pitch, no bruits, no rebound, no guarding, no midline pulsatile mass, no hepatomegaly, no splenomegaly EXT:  2 plus pulses throughout, no edema, no cyanosis no clubbing SKIN:  No rashes no nodules NEURO:  Cranial nerves II through XII grossly intact, motor grossly intact throughout PSYCH:  Cognitively intact,  oriented to person place and time   Assessment & Plan .     73F admitted with new onset acute systolic heart failure and new atrial fibrillation.  # Acute systolic and diastolic HF: # Hypotension: LVEF this admission 20-25%.  This is new.  She now appears to be euvolemic.  Given her reduced systolic function and new onset atrial fibrillation she underwent TEE/DCCV.   Initially successful but transitioned back to sinus rhythm.  She became hypotensive after starting Entresto .  She is tolerating losartan  12.5mg  daily.  HR too low for beta blockers.  Now on Farxiga .  She will need an echocardiogram in 3 months.  If systolic function remains reduced to recommend  ischemic evaluation and possible ICD.  # Persistent atrial fibrillation:  Continue Eliquis .  Recurrent atrial fibrillation after DCCV.  Holding carvedilol  as above.  Given her underlying ILD, she isn't a good candidate for amiodarone.  She has been seen by EP and is now on dofetilide .  # Hyperlipidemia: Statin intolerant.  Continue Zetia .   Dispo:  Stable for discharge from a cardiology standpoint once cleared by EP.    For questions or updates, please contact [facility] Please consult [email or web address] for contact info under        Signed, Annabella Scarce, MD

## 2023-12-08 NOTE — Progress Notes (Addendum)
 Patient already discharged prior to my review of her final EKG Post dose EKG reviewed (not in Epic) SR 62bpm, QTc looks good D/c med list reviewed Rx for dofetilide  was sent to [facility] AFib clinic f/u was in place and on her AVS  Mertha Abrahams, PA-C

## 2023-12-08 NOTE — Assessment & Plan Note (Addendum)
 Chronic, likely in the setting of dependent edema. DVT U/S negative for acute DVT. Bilateral foot pain R>L. TTP palpation and with walking. Concern for gout with hx, however uric acid 5.0. L/R foot XR demonstrated severe OA. - Continue Colchicine  0.6 mg daily - Continue to monitor

## 2023-12-11 ENCOUNTER — Telehealth: Payer: Self-pay

## 2023-12-11 NOTE — Transitions of Care (Post Inpatient/ED Visit) (Signed)
   12/11/2023  Name: Donna Herrera MRN: [id number] DOB: Apr 21, 1946  Today's TOC FU Call Status: Today's TOC FU Call Status:: Unsuccessful Call (1st Attempt) Unsuccessful Call (1st Attempt) Date: 12/11/23  Attempted to reach the patient regarding the most recent Inpatient/ED visit.Patient was called in an Outreach attempt to offer VBCI  30-day TOC program.Pt is eligible for program due to potential risk for readmission and/or high utilization. Unfortunately, I was not able to speak with the patient in regards to recent hospital discharge   Follow Up Plan: Additional outreach attempts will be made to reach the patient to complete the Transitions of Care (Post Inpatient/ED visit) call.   Irineo Manns RN, BSN, CCM [place]  [facility] Manager Population Health Direct Dial: [phone]  Fax: [phone]

## 2023-12-12 ENCOUNTER — Other Ambulatory Visit: Payer: Self-pay | Admitting: Family Medicine

## 2023-12-12 ENCOUNTER — Ambulatory Visit: Payer: Medicare Other

## 2023-12-12 DIAGNOSIS — M25551 Pain in right hip: Secondary | ICD-10-CM

## 2023-12-12 NOTE — Progress Notes (Deleted)
    SUBJECTIVE:   CHIEF COMPLAINT / HPI:   Hospital Follow Up:  - Admitted to [facility] 12/01/23 - 12/08/23 - New onset A-Fib with RVR initially requiring Coreg, d/c due to bradycardia with unsuccessful cardioversion attempt and subsequently starting Tikosyn with EP  - On Eliquis  - Cardiology appt 12/15/23 - Reports today ***  PCP Follow-up Recommendations: Ensure patient has discontinued ASA 81 mg daily. Ensure follow-up with EP/cardiology for A-fib and CHF. Add GDMT as able, BP permitting.  Follow up with pulmonology - consider high res CT to further quantify pulmonary fibrosis and PFT for COPD diagnosis Echo in 3 months. If systolic function remains reduced to recommend ischemic evaluation.  Could consider restarting Coreg 3.125 mg if her HR are stable, with close follow up. In the setting of Low EF recommended should not restart or increase dose per pharmacy.  Per Message from Inpatient Physician, Dr. Fatima Blank:  She will require some further medication assistance for her HF and A-fib medications. TOC provided her with a month's supply of all her medications at a low price. Her family is amenable to helping pay for medications.   Imaging:  Echo demonstrated EF 20-25%, global hypokinesis, trivial MVR, and IVC variability >50%. TEE demonstrated EF <20%, LV with severely decreased function, LV global hypokinesis, RA severely dilated, moderate MVR, mild-mod TVR, trivial AVR, and mild G2 layered plaque involving the descending aorta.   PERTINENT  PMH / PSH:  Acute on Chronic Heart Failure  Atherosclerosis  HTN Afib NICM ILD  COPD  Pulmonary fibrosis  GERD Cervical DDD  OA   OBJECTIVE:   There were no vitals taken for this visit.  ***  ASSESSMENT/PLAN:   Assessment & Plan      Alfredo Martinez, MD [facility]

## 2023-12-12 NOTE — Patient Instructions (Incomplete)
It was great to see you today! Thank you for choosing [facility] for your primary care.  Today we addressed: Please continue with visit at cardiologist   If you haven't already, sign up for My Chart to have easy access to your labs results, and communication with your primary care physician. I recommend that you always bring your medications to each appointment as this makes it easy to ensure you are on the correct medications and helps [name] not miss refills when you need them. Call the clinic at [phone] if your symptoms worsen or you have any concerns.  Please arrive 15 minutes before your appointment to ensure smooth check in process.  We appreciate your efforts in making this happen.  Thank you for allowing me to participate in your care, Alfredo Martinez, MD 12/12/2023, 8:35 AM PGY-3, [facility]

## 2023-12-14 NOTE — Progress Notes (Signed)
HEART & VASCULAR TRANSITION OF CARE CONSULT NOTE     Referring Physician: Penne Lash, MD @PCPCARD @   Chief Complaint:   HPI: Referred to clinic by *** for heart failure consultation.   Donna Herrera is a 78 y.o. female   Cardiac Testing    Past Medical History:  Diagnosis Date   Adenomatous polyp 03/23/2020   Colonoscopy 2021 notable for adenomatous polyp. Per GI, no further follow up colonoscopies recommended given patient's age.   Allergy    Arthritis    Atherosclerosis of aorta (HCC) 07/15/2021   Candidiasis, mouth 10/19/2017   Chronic combined systolic and diastolic CHF (congestive heart failure) (HCC) 10/21/2015   Echo 3/16: EF 40%, diffuse HK, mild MR, moderate LAE, mild RAE, PASP 42 mmHg  //  Echo 12/16: Mild LVH, EF 35-40%, diffuse HK, Gr 2 DD, MAC, mild MR, mod LAE, normal RVSF, PASP 49 mmHg // Echo 5/19: EF 15-20, diff HK, Gr 2 DD, trivial AI, MAC, trivial MR, mod LAE, normal RVSF, mild TR, PASP 62 // Limited Echo 8/19: mod LVH, EF 40-45, diff HK, Gr 1 DD, MAC, mild LAE    Chronic left shoulder pain 04/10/2013   Chronic lung disease    Chronic pain    Chronic pain syndrome 07/20/2017   Confusion 06/08/2021   Depression 08/25/2016   DJD (degenerative joint disease) 10/12/2012   Multiple joint replacements by Dr. Leslee Home.    Dyshidrotic eczema 12/31/2014   Edentulous 07/19/2021   Emphysema lung (HCC) 03/02/2021   Long smoking history.  Noted on CT Low Dose Chest: 01/2021: IMPRESSION: Chronic interstitial changes raise the question of pulmonary fibrosis. High-resolution chest CT may prove helpful to further evaluate as clinically warranted.Emphysema (ICD10-J43.9) and Aortic Atherosclerosis (ICD10-170.0)  Recommended pulmonology referral but patient declined. Recommend continued discussion and symptom monitor   Essential hypertension 07/20/2017   GERD (gastroesophageal reflux disease)    Gout    H/O slipped capital femoral epiphysis (SCFE)    Heart  failure, left, with LVEF 41-49% (HCC) 10/21/2015   transthoracic echocardiogram 06/06/18: EF appears improved to 40-45% with diffuse hypokinesis  A. Echo 3/16: EF 40%, diffuse HK, mild MR, moderate LAE, mild RAE, PASP 42 mmHg  //  B. Echo 12/16: Mild LVH, EF 35-40%, diffuse HK, grade 2 diastolic dysfunction, MAC, mild MR, moderate LAE, normal RVSF, PASP 49 mmHg   History of total right hip replacement 04/02/2019   Followed closely with orthopedist Dr. Charlann Boxer. Patient is s/p total right hip replacement in 1980's with evidence of bone loss and osteopenia making reconstruction unlikely. Plan for conservative treatment at this time with crutches for ambulation and pain control.   Hyperlipidemia    Hypertension    Hypertensive heart disease with CHF (congestive heart failure) (HCC) 10/12/2012   Loss of taste 02/06/2018   Mass of breast, left 07/21/2014   NICM (nonischemic cardiomyopathy) (HCC) 06/09/2016   A. LHC 7/17: Normal coronary arteries, EF 30-35%, LVEDP 14 mmHg   Pain in gums 04/18/2018   Pre-diabetes    Pulmonary fibrosis (HCC) 03/02/2021   CT Low Dose Chest: 01/2021: IMPRESSION: Chronic interstitial changes raise the question of pulmonary fibrosis. High-resolution chest CT may prove helpful to further evaluate as clinically warranted.Emphysema (ICD10-J43.9) and Aortic Atherosclerosis (ICD10-170.0)  Recommended pulmonology referral but patient declined. Recommend continued discussion and symptom monitoring.   Pulmonary nodule 1 cm or greater in diameter 03/02/2021   CT low dose Chest: 01/2021: 1. Lung-RADS 4A, suspicious. 10 mm posterior left lower lobe pulmonary  nodule, possibly scarring. Follow up low-dose chest CT without contrast in 3 months (please use the following order, "CT CHEST LCS NODULE FOLLOW-UP W/O CM") is recommended. Alternatively, PET may be considered when there is a solid component 8mm or larger.  Plan: Follow up low dose CT ordered in 3 mon   Rib pain on left side 09/17/2020   Right hip pain  11/02/2012   Tendonitis, Achilles, left 05/24/2017   Tobacco abuse 09/29/2014   Urge incontinence 07/19/2021   Vitamin D deficiency 12/31/2014    Current Outpatient Medications  Medication Sig Dispense Refill   acetaminophen (TYLENOL) 500 MG tablet Take 1,000 mg by mouth daily as needed for mild pain or moderate pain.     albuterol (VENTOLIN HFA) 108 (90 Base) MCG/ACT inhaler Inhale 1-2 puffs into the lungs every 6 (six) hours as needed for wheezing or shortness of breath. 8.5 each 3   allopurinol (ZYLOPRIM) 100 MG tablet Take 1 tablet (100 mg total) by mouth daily. 90 tablet 2   apixaban (ELIQUIS) 5 MG TABS tablet Take 1 tablet (5 mg total) by mouth 2 (two) times daily. 60 tablet 0   baclofen (LIORESAL) 10 MG tablet TAKE 1 TABLET 3 TIMES DAILY. WILL REFILL FOR 2 MONTH SUPPLY, PLEASE SCHEDULE APPOINTMENT (Patient taking differently: Take 10 mg by mouth 3 (three) times daily as needed for muscle spasms.) 270 tablet 1   [Paused] carvedilol (COREG) 3.125 MG tablet TAKE 1 TABLET BY MOUTH 2 TIMES DAILY. 180 tablet 1   colchicine 0.6 MG tablet TAKE 1 TABLET BY MOUTH EVERY DAY (Patient taking differently: Take 0.6 mg by mouth daily as needed (gout flare up).) 90 tablet 0   dapagliflozin propanediol (FARXIGA) 10 MG TABS tablet Take 1 tablet (10 mg total) by mouth daily. 30 tablet 0   dofetilide (TIKOSYN) 125 MCG capsule Take 1 capsule (125 mcg total) by mouth 2 (two) times daily. 60 capsule 0   DULoxetine (CYMBALTA) 30 MG capsule TAKE 1 CAPSULE(30 MG) BY MOUTH DAILY 30 capsule 2   ezetimibe (ZETIA) 10 MG tablet TAKE 1 TABLET BY MOUTH EVERY DAY (Patient taking differently: Take 10 mg by mouth 3 (three) times a week.) 90 tablet 3   famotidine (PEPCID) 20 MG tablet TAKE 1 TABLET (20 MG TOTAL) BY MOUTH DAILY AS NEEDED FOR HEARTBURN OR INDIGESTION. 90 tablet 1   ferrous sulfate 325 (65 FE) MG tablet Take 1 tablet (325 mg total) by mouth every other day. 30 tablet 0   furosemide (LASIX) 20 MG tablet TAKE 20 MG  TABLET AS NEEDED TWICE PER WEEK FOR INCREASED SWELLING OR WEIGHT GAIN >5 POUNDS IN A WEEK 45 tablet 1   gabapentin (NEURONTIN) 300 MG capsule Take 1 capsule (300 mg total) by mouth at bedtime as needed. TAKE 1 CAP BY MOUTH EVERYDAY AT BEDTIME. 30 capsule 2   losartan (COZAAR) 25 MG tablet Take 0.5 tablets (12.5 mg total) by mouth daily. 30 tablet 0   Menthol, Topical Analgesic, (ICY HOT BACK EX) Apply 1 Application topically 2 (two) times daily as needed (lower leg pain, knee pain).     Multiple Vitamin (MULTIVITAMIN WITH MINERALS) TABS tablet Take by mouth 2 (two) times a week.     naloxone (NARCAN) nasal spray 4 mg/0.1 mL SMARTSIG:Both Nares     oxyCODONE (OXY IR/ROXICODONE) 5 MG immediate release tablet Take 5 mg by mouth every 6 (six) hours as needed for moderate pain or severe pain.     Vitamin D, Ergocalciferol, (DRISDOL) 1.25 MG (  50000 UNIT) CAPS capsule Take 50,000 Units by mouth once a week. Take on Wednesday     No current facility-administered medications for this visit.    Allergies  Allergen Reactions   Crab [Shellfish Allergy]     Itching to lips   Morphine And Codeine Itching and Swelling   Atorvastatin Other (See Comments)    achiness   Rosuvastatin Other (See Comments)    Muscle pain and achiness      Social History   Socioeconomic History   Marital status: Widowed    Spouse name: Not on file   Number of children: 3   Years of education: Not on file   Highest education level: High school graduate  Occupational History   Occupation: retired  Tobacco Use   Smoking status: Former    Current packs/day: 0.00    Average packs/day: 0.5 packs/day for 55.0 years (27.5 ttl pk-yrs)    Types: Cigarettes    Start date: 45    Quit date: 2017    Years since quitting: 8.1    Passive exposure: Past   Smokeless tobacco: Never  Vaping Use   Vaping status: Never Used  Substance and Sexual Activity   Alcohol use: Not Currently   Drug use: No   Sexual activity: Not on  file  Other Topics Concern   Not on file  Social History Narrative   Formerly daycare provider. Lives with husband, daughter Lajoyce Corners and grandkids.   Social Drivers of Corporate investment banker Strain: Low Risk  (12/05/2023)   Overall Financial Resource Strain (CARDIA)    Difficulty of Paying Living Expenses: Not hard at all  Food Insecurity: No Food Insecurity (12/03/2023)   Hunger Vital Sign    Worried About Running Out of Food in the Last Year: Never true    Ran Out of Food in the Last Year: Never true  Transportation Needs: No Transportation Needs (12/05/2023)   PRAPARE - Administrator, Civil Service (Medical): No    Lack of Transportation (Non-Medical): No  Physical Activity: Insufficiently Active (04/28/2023)   Exercise Vital Sign    Days of Exercise per Week: 3 days    Minutes of Exercise per Session: 30 min  Stress: No Stress Concern Present (04/28/2023)   [employer] of Occupational Health - Occupational Stress Questionnaire    Feeling of Stress : Only a little  Social Connections: Moderately Isolated (12/03/2023)   Social Connection and Isolation Panel [NHANES]    Frequency of Communication with Friends and Family: More than three times a week    Frequency of Social Gatherings with Friends and Family: Three times a week    Attends Religious Services: More than 4 times per year    Active Member of Clubs or Organizations: No    Attends Banker Meetings: Never    Marital Status: Widowed  Intimate Partner Violence: Not At Risk (12/03/2023)   Humiliation, Afraid, Rape, and Kick questionnaire    Fear of Current or Ex-Partner: No    Emotionally Abused: No    Physically Abused: No    Sexually Abused: No      Family History  Problem Relation Age of Onset   Depression Mother    Diabetes Mother    Hypertension Mother    Stroke Mother    Heart disease Mother    Cancer Father        unknown primary   Diabetes Sister    Stomach cancer Brother  Throat cancer Son    Breast cancer Neg Hx    Esophageal cancer Neg Hx    Colon cancer Neg Hx    Colon polyps Neg Hx     There were no vitals filed for this visit.  PHYSICAL EXAM: General:  Well appearing. No respiratory difficulty HEENT: normal Neck: supple. no JVD. Carotids 2+ bilat; no bruits. No lymphadenopathy or thryomegaly appreciated. Cor: PMI nondisplaced. Regular rate & rhythm. No rubs, gallops or murmurs. Lungs: clear Abdomen: soft, nontender, nondistended. No hepatosplenomegaly. No bruits or masses. Good bowel sounds. Extremities: no cyanosis, clubbing, rash, edema Neuro: alert & oriented x 3, cranial nerves grossly intact. moves all 4 extremities w/o difficulty. Affect pleasant.  ECG:   ASSESSMENT & PLAN: Assessment & Plan  NYHA *** GDMT  Diuretic- BB- Ace/ARB/ARNI MRA SGLT2i    Referred to HFSW (PCP, Medications, Transportation, ETOH Abuse, Drug Abuse, Insurance, Financial ): Yes or No Refer to Pharmacy: Yes or No Refer to Home Health: Yes on No Refer to [facility] Clinic: Yes or no  Refer to General Cardiology: Yes or No  Follow up

## 2023-12-15 ENCOUNTER — Encounter (HOSPITAL_COMMUNITY): Payer: Self-pay

## 2023-12-15 ENCOUNTER — Ambulatory Visit (HOSPITAL_COMMUNITY)
Admission: RE | Admit: 2023-12-15 | Discharge: 2023-12-15 | Disposition: A | Discharge status: Home / Self Care | Payer: Medicare Other | Source: Ambulatory Visit | Attending: Cardiology | Admitting: Cardiology

## 2023-12-15 ENCOUNTER — Ambulatory Visit (HOSPITAL_COMMUNITY): Payer: Medicare Other | Admitting: Physician Assistant

## 2023-12-15 VITALS — BP 136/96 | HR 56 | Wt 163.6 lb

## 2023-12-15 DIAGNOSIS — I2724 Chronic thromboembolic pulmonary hypertension: Secondary | ICD-10-CM | POA: Insufficient documentation

## 2023-12-15 DIAGNOSIS — E785 Hyperlipidemia, unspecified: Secondary | ICD-10-CM | POA: Insufficient documentation

## 2023-12-15 DIAGNOSIS — Z8249 Family history of ischemic heart disease and other diseases of the circulatory system: Secondary | ICD-10-CM | POA: Insufficient documentation

## 2023-12-15 DIAGNOSIS — J849 Interstitial pulmonary disease, unspecified: Secondary | ICD-10-CM | POA: Insufficient documentation

## 2023-12-15 DIAGNOSIS — Z79899 Other long term (current) drug therapy: Secondary | ICD-10-CM | POA: Insufficient documentation

## 2023-12-15 DIAGNOSIS — I491 Atrial premature depolarization: Secondary | ICD-10-CM | POA: Diagnosis not present

## 2023-12-15 DIAGNOSIS — I5022 Chronic systolic (congestive) heart failure: Secondary | ICD-10-CM

## 2023-12-15 DIAGNOSIS — I110 Hypertensive heart disease with heart failure: Secondary | ICD-10-CM | POA: Diagnosis present

## 2023-12-15 DIAGNOSIS — Z7901 Long term (current) use of anticoagulants: Secondary | ICD-10-CM | POA: Diagnosis not present

## 2023-12-15 DIAGNOSIS — I428 Other cardiomyopathies: Secondary | ICD-10-CM | POA: Insufficient documentation

## 2023-12-15 DIAGNOSIS — I4891 Unspecified atrial fibrillation: Secondary | ICD-10-CM | POA: Diagnosis not present

## 2023-12-15 LAB — CBC
HCT: 40.2 % (ref 36.0–46.0)
Hemoglobin: 12.4 g/dL (ref 12.0–15.0)
MCH: 27.6 pg (ref 26.0–34.0)
MCHC: 30.8 g/dL (ref 30.0–36.0)
MCV: 89.5 fL (ref 80.0–100.0)
Platelets: 305 10*3/uL (ref 150–400)
RBC: 4.49 MIL/uL (ref 3.87–5.11)
RDW: 15 % (ref 11.5–15.5)
WBC: 4 10*3/uL (ref 4.0–10.5)
nRBC: 0 % (ref 0.0–0.2)

## 2023-12-15 LAB — BASIC METABOLIC PANEL
Anion gap: 13 (ref 5–15)
BUN: 12 mg/dL (ref 8–23)
CO2: 22 mmol/L (ref 22–32)
Calcium: 9.3 mg/dL (ref 8.9–10.3)
Chloride: 105 mmol/L (ref 98–111)
Creatinine, Ser: 0.92 mg/dL (ref 0.44–1.00)
GFR, Estimated: >60 mL/min (ref >60)
Glucose, Bld: 88 mg/dL (ref 70–99)
Potassium: 4 mmol/L (ref 3.5–5.1)
Sodium: 140 mmol/L (ref 135–145)

## 2023-12-15 LAB — MAGNESIUM: Magnesium: 2.3 mg/dL (ref 1.7–2.4)

## 2023-12-15 MED ORDER — LOSARTAN POTASSIUM 25 MG PO TABS: 25.0000 mg | ORAL_TABLET | Freq: Every day | ORAL | 1 refills | Status: DC

## 2023-12-15 NOTE — Patient Instructions (Signed)
Medication Changes:  INCREASE LOSARTAN TO 25MG  TAKE (1) TABLET ONCE DAILY   Lab Work:  Labs done today, your results will be available in MyChart, we will contact you for abnormal readings.  Follow-Up in: KEEP FOLLOW UPS AS SCHEDULED   MESSAGE SENT TO [facility] TO SCHEDULE APPOINTMENT WITH DR. ROSS   THE NUMBER TO HEART CARE TO SCHEDULE IS [phone]  At the [facility], you and your health needs are our priority. We have a designated team specialized in the treatment of Heart Failure. This Care Team includes your primary Heart Failure Specialized Cardiologist (physician), Advanced Practice Providers (APPs- Physician Assistants and Nurse Practitioners), and Pharmacist who all work together to provide you with the care you need, when you need it.   You may see any of the following providers on your designated Care Team at your next follow up:  Dr. Arvilla Meres Dr. Marca Ancona Dr. Dorthula Nettles Dr. Theresia Bough Tonye Becket, NP Robbie Lis, [place] [facility], [place] Brynda Peon, NP [place] Lee, NP Karle Plumber, PharmD   Please be sure to bring in all your medications bottles to every appointment.   Need to Contact us:  If you have any questions or concerns before your next appointment please send [name] a message through [place] or call our office at [phone].    TO LEAVE A MESSAGE FOR THE NURSE SELECT OPTION 2, PLEASE LEAVE A MESSAGE INCLUDING: YOUR NAME DATE OF BIRTH CALL BACK NUMBER REASON FOR CALL**this is important as we prioritize the call backs  YOU WILL RECEIVE A CALL BACK THE SAME DAY AS LONG AS YOU CALL BEFORE 4:00 PM

## 2023-12-18 ENCOUNTER — Telehealth: Payer: Self-pay

## 2023-12-18 NOTE — Transitions of Care (Post Inpatient/ED Visit) (Signed)
12/18/2023  Name: Donna Herrera MRN: [id number] DOB: 1946-06-21  Today's TOC FU Call Status: Today's TOC FU Call Status:: Successful TOC FU Call Completed TOC FU Call Complete Date: 12/18/23 Patient's Name and Date of Birth confirmed.  Transition Care Management Follow-up Telephone Call Type of Discharge: Inpatient Admission Primary Inpatient Discharge Diagnosis:: New onset A-fib w/ RVR How have you been since you were released from the hospital?: Better Any questions or concerns?: No  Items Reviewed: Did you receive and understand the discharge instructions provided?: Yes Medications obtained,verified, and reconciled?: No (Patient states daugther and granddaughter help with meds and they are at work) Any new allergies since your discharge?: No Dietary orders reviewed?: Yes Type of Diet Ordered:: low sodium heart healthy Do you have support at home?: Yes Name of Support/Comfort Primary Source: Dtr and her husband live with her  Medications Reviewed Today: Medications Reviewed Today     Reviewed by Jessy Oto, RN (Registered Nurse) on 12/18/23 at 1650  Med List Status: <None>   Medication Order Taking? Sig Documenting Provider Last Dose Status Informant  acetaminophen (TYLENOL) 500 MG tablet [id number] No Take 1,000 mg by mouth daily as needed for mild pain or moderate pain. [provider] Taking Active Self, Pharmacy Records  albuterol (VENTOLIN HFA) 108 (90 Base) MCG/ACT inhaler [id number] No Inhale 1-2 puffs into the lungs every 6 (six) hours as needed for wheezing or shortness of breath. Georg Ruddle Mahnoor, MD Taking Active Self, Pharmacy Records  allopurinol (ZYLOPRIM) 100 MG tablet [id number] No Take 1 tablet (100 mg total) by mouth daily. Georg Ruddle Mahnoor, MD Taking Active Self, Pharmacy Records  apixaban (ELIQUIS) 5 MG TABS tablet [id number] No Take 1 tablet (5 mg total) by mouth 2 (two) times daily. Elberta Fortis, MD Taking Active   baclofen  (LIORESAL) 10 MG tablet [id number] No TAKE 1 TABLET 3 TIMES DAILY. WILL REFILL FOR 2 MONTH SUPPLY, PLEASE SCHEDULE APPOINTMENT  Patient not taking: Reported on 12/15/2023   Cora Collum, DO Not Taking Active Self, Pharmacy Records  carvedilol (COREG) 3.125 MG tablet [id number] No TAKE 1 TABLET BY MOUTH 2 TIMES DAILY. Georg Ruddle Mahnoor, MD 12/01/2023 Morning Active Self, Pharmacy Records  colchicine 0.6 MG tablet [id number] No TAKE 1 TABLET BY MOUTH EVERY DAY  Patient taking differently: Take 0.6 mg by mouth daily as needed (gout flare up).   Baloch, Mahnoor, MD Taking Active Self, Pharmacy Records  dapagliflozin propanediol (FARXIGA) 10 MG TABS tablet [id number] No Take 1 tablet (10 mg total) by mouth daily. Elberta Fortis, MD Taking Active   dofetilide [facility]) 125 MCG capsule [id number] No Take 1 capsule (125 mcg total) by mouth 2 (two) times daily. Elberta Fortis, MD Taking Active   DULoxetine (CYMBALTA) 30 MG capsule [id number] No TAKE 1 CAPSULE(30 MG) BY MOUTH DAILY Baloch, Mahnoor, MD Taking Active   ezetimibe (ZETIA) 10 MG tablet [id number] No TAKE 1 TABLET BY MOUTH EVERY DAY Pricilla Riffle, MD Taking Active Self, Pharmacy Records  famotidine (PEPCID) 20 MG tablet [id number] No TAKE 1 TABLET (20 MG TOTAL) BY MOUTH DAILY AS NEEDED FOR HEARTBURN OR INDIGESTION. Cora Collum, DO Taking Active Self, Pharmacy Records  ferrous sulfate 325 (65 FE) MG tablet [id number] No Take 1 tablet (325 mg total) by mouth every other day.  Patient not taking: Reported on 12/15/2023   Elberta Fortis, MD Not Taking Active   furosemide (LASIX) 20 MG tablet [id number] No TAKE 20 MG TABLET AS NEEDED TWICE PER WEEK FOR INCREASED SWELLING OR WEIGHT GAIN >  5 POUNDS IN A Liz Beach, MD Taking Active            Med Note Rosalio Macadamia, Kennard B   Fri Dec 15, 2023 10:08 AM) Taking 3 times weekly MWF  gabapentin (NEURONTIN) 300 MG capsule [id number] No Take 1 capsule (300 mg total) by mouth at bedtime as needed. TAKE 1  CAP BY MOUTH EVERYDAY AT BEDTIME. Georg Ruddle Mahnoor, MD Taking Active Self, Pharmacy Records  losartan (COZAAR) 25 MG tablet [id number]  Take 1 tablet (25 mg total) by mouth daily. Robbie Lis M, PA-C  Active   Menthol, Topical Analgesic, (ICY HOT BACK EX) [id number] No Apply 1 Application topically 2 (two) times daily as needed (lower leg pain, knee pain). [provider] Taking Active Self, Pharmacy Records  Multiple Vitamin (MULTIVITAMIN WITH MINERALS) TABS tablet [id number] No Take by mouth 2 (two) times a week.  Patient not taking: Reported on 12/15/2023   [provider] Not Taking Active Self, Pharmacy Records  naloxone [facility]) nasal spray 4 mg/0.1 mL [id number] No SMARTSIG:Both Nares [provider] Taking Active Self, Pharmacy Records           Med Note (LEE, NICOLE   Sat Dec 02, 2023  1:47 AM) Has on hand at home  oxyCODONE (OXY IR/ROXICODONE) 5 MG immediate release tablet [id number] No Take 5 mg by mouth every 6 (six) hours as needed for moderate pain or severe pain. [provider] Taking Active Self, Pharmacy Records  Vitamin D, Ergocalciferol, (DRISDOL) 1.25 MG (50000 UNIT) CAPS capsule [id number] No Take 50,000 Units by mouth once a week. Take on Wednesday [provider] Taking Active Self, Pharmacy Records            Home Care and Equipment/Supplies: Were Home Health Services Ordered?: Yes (Patient states she has not heard from [facility] and agreed to CM making a conference call - called [phone] and spoke with Irving Burton who states they have an order for PT and OT and scheduled with patient for Monday 12/25/23) Name of Home Health Agency:: [facility] Has Agency set up a time to come to your home?: Yes First Home Health Visit Date: 12/25/23 Any new equipment or medical supplies ordered?: No (patient states she has been using crutches since age 48 for a bone disease)  Functional Questionnaire: Do you need assistance with  bathing/showering or dressing?: No (patient states she has shower chair and grab bars but is nervous and CM educated on having PT/OT eval for needs with bath and order any needed equipment) Do you need assistance with meal preparation?: No (Gets MOW once a day - oldest daughter and granddaughter provide meals) Do you need assistance with eating?: No Do you have difficulty maintaining continence: Yes (patient wears Depends for occassional incontinence - denies any skin breakdown) Do you need assistance with getting out of bed/getting out of a chair/moving?: No Do you have difficulty managing or taking your medications?: Yes (daughter and grandaughter help)  Follow up appointments reviewed: PCP Follow-up appointment confirmed?: No (patient states she will call PCP office about hospital f/u and states she's been seen by cardio since d/c) MD Provider Line Number:[phone] Given: No Specialist Hospital Follow-up appointment confirmed?: Yes Date of Specialist follow-up appointment?: 12/15/23 Follow-Up Specialty Provider:: [facility] Do you need transportation to your follow-up appointment?: No Do you understand care options if your condition(s) worsen?: Yes-patient verbalized understanding  SDOH Interventions Today    Flowsheet Row Most Recent Value  SDOH Interventions  Food Insecurity Interventions Intervention Not Indicated  Housing Interventions Intervention Not Indicated  Transportation Interventions Intervention Not Indicated  Utilities Interventions Intervention Not Indicated       Goals Addressed               This Visit's Progress     Patient will report no readmissions in the next 30 days (pt-stated)        Current Barriers:  Provider appointments . Patient was unaware of need to schedule hospital follow up with PCP. States she already saw cardio  Home Health services Patient was aware Home Health was ordered but was not aware of who to call for  schedule Patient with new diagnosis of Atrial Fibrillation with medication changes. Patient states daughter and granddaughter assist but were not present for review.   Patient reports understanding diagnosis of CHF but reports no scale in home.  Record reflects weight of 163lbs 9.6oz from 2/14 cardio visit.    RNCM Clinical Goal(s):  Patient will gain better understanding of Afib including recognizing signs/symptoms, understanding medications, risks  Patient will better management CHF and understand rationale for daily weights and calling RN/MD with weight gain   Interventions: Evaluation of current treatment plan related to  self management and patient's adherence to plan as established by provider  Transitions of Care:  New goal. RN will follow up with patient weekly and as needed to assess overall status and address any new issues or concerns as they rise RN contacted [facility] services to discuss start date for patient with PT and OT.  Scheduled 12/25/23 per patient request RN will continue to provide education on Atrial Fibrillation disease process  RN will attempt to speak with daughter or granddaughter and educate that patient should take all medications exactly as prescribed in future call RN will continue to assess for worsening symptoms related heart failure and assess that patient has purchased a scale for the home  AFIB Interventions: (Status:  New goal.) Short Term Goal   Counseled on increased risk of stroke due to Afib and benefits of anticoagulation for stroke prevention -Reviewed importance of adherence to anticoagulant exactly as prescribed -Counseled on bleeding risk associated with Eliquis and importance of self-monitoring for signs/symptoms of bleeding -Counseled on seeking medical attention after a head injury or if there is blood in the urine/stool  Patient Goals/Self-Care Activities: Patient will participate in Transition of Care Program/Attend [facility] scheduled  calls Notify RN Care Manager of TOC call rescheduling needs Take all medications as prescribed Attend all scheduled provider appointments Patient will take medicine as prescribed Contact RN/MD with any chest pain, shortness of breath, palpitations Call and schedule hospital follow up with PCP Follow up closely with cardiology related to Tikosyn Monitor BP and HR daily or as directed by cardiology Monitor for signs/symptoms of bleeding related to Eliquis Obtain scale and weigh daily in the morning after emptying bladder and report 3lb weight gain over night or 5lb weight gain in a week or with swelling, shortness of breath Monitor Bowel movements and be aware that Ferrous sulfate can cause stool to get dark - Call MD with any concerns  Follow Up Plan:  Telephone follow up appointment with care management team member scheduled for:  12/26/23 at 2pm  The patient has been provided with contact information for the care management team and has been advised to call with any health related questions or concerns.          Hilbert Odor RN, CCM [facility]  VBCI-Population Health RN Care Manager [phone]

## 2023-12-19 ENCOUNTER — Telehealth: Payer: Self-pay

## 2023-12-19 NOTE — Patient Outreach (Signed)
  Care Coordination   Follow Up Visit Note   12/19/2023 Name: Alfreida Steffenhagen MRN: [id number] DOB: [birth year]  Hayzlee Mcsorley is a 78 y.o. year old female who sees Baloch, Mahnoor, MD for primary care. Voicemail was received from [place] at [facility] stating they do not have an order for Home health and requested a call back. RNCM called back to number provided at [phone] and spoke with [place]. RNCM advised Eileen Stanford that records show that Call was made to [facility] 12/06/23 at 1629 and Kandee Keen accepted the case. Jenna advised RNCM she will call [facility] and try to get an order because it wasn't included with the documents provided by [facility]. RNCM asked Eileen Stanford to call back if she is unable to get the order.    Care Coordination Interventions:  Yes, provided   Follow up plan: Follow up call scheduled for 12/26/23  as previously scheduled  Encounter Outcome:  Patient Visit Completed    Hilbert Odor RN, CCM [place]  VBCI-Population Health RN Care Manager [phone]

## 2023-12-20 ENCOUNTER — Ambulatory Visit (HOSPITAL_COMMUNITY): Payer: Medicare Other | Admitting: Physician Assistant

## 2023-12-25 ENCOUNTER — Ambulatory Visit (HOSPITAL_COMMUNITY)
Admission: RE | Admit: 2023-12-25 | Discharge: 2023-12-25 | Disposition: A | Discharge status: Home / Self Care | Payer: Medicare Other | Source: Ambulatory Visit | Attending: Physician Assistant | Admitting: Physician Assistant

## 2023-12-25 VITALS — BP 182/94 | HR 61 | Ht 66.0 in | Wt 160.0 lb

## 2023-12-25 DIAGNOSIS — Z7901 Long term (current) use of anticoagulants: Secondary | ICD-10-CM | POA: Diagnosis not present

## 2023-12-25 DIAGNOSIS — I4819 Other persistent atrial fibrillation: Secondary | ICD-10-CM | POA: Diagnosis not present

## 2023-12-25 DIAGNOSIS — J8410 Pulmonary fibrosis, unspecified: Secondary | ICD-10-CM | POA: Diagnosis present

## 2023-12-25 DIAGNOSIS — Z79899 Other long term (current) drug therapy: Secondary | ICD-10-CM | POA: Insufficient documentation

## 2023-12-25 DIAGNOSIS — E785 Hyperlipidemia, unspecified: Secondary | ICD-10-CM | POA: Diagnosis not present

## 2023-12-25 DIAGNOSIS — I5022 Chronic systolic (congestive) heart failure: Secondary | ICD-10-CM | POA: Diagnosis present

## 2023-12-25 DIAGNOSIS — I110 Hypertensive heart disease with heart failure: Secondary | ICD-10-CM | POA: Diagnosis present

## 2023-12-25 DIAGNOSIS — I10 Essential (primary) hypertension: Secondary | ICD-10-CM | POA: Diagnosis not present

## 2023-12-25 DIAGNOSIS — D6869 Other thrombophilia: Secondary | ICD-10-CM | POA: Diagnosis not present

## 2023-12-25 LAB — BASIC METABOLIC PANEL
Anion gap: 12 (ref 5–15)
BUN: 10 mg/dL (ref 8–23)
CO2: 24 mmol/L (ref 22–32)
Calcium: 9.4 mg/dL (ref 8.9–10.3)
Chloride: 104 mmol/L (ref 98–111)
Creatinine, Ser: 0.79 mg/dL (ref 0.44–1.00)
GFR, Estimated: >60 mL/min (ref >60)
Glucose, Bld: 88 mg/dL (ref 70–99)
Potassium: 4 mmol/L (ref 3.5–5.1)
Sodium: 140 mmol/L (ref 135–145)

## 2023-12-25 LAB — MAGNESIUM: Magnesium: 2.2 mg/dL (ref 1.7–2.4)

## 2023-12-25 MED ORDER — DOFETILIDE 125 MCG PO CAPS: 125.0000 ug | ORAL_CAPSULE | Freq: Two times a day (BID) | ORAL | 1 refills | Status: DC

## 2023-12-25 NOTE — Progress Notes (Signed)
 Primary Care Physician: Penne Lash, MD Primary Cardiologist: Dietrich Pates, MD Electrophysiologist: Lanier Prude, MD  Referring Physician: Dr Melonie Florida is a 78 y.o. female with a history of HTN, pulmonary fibrosis, HFrEF, HLD, atrial fibrillation who presents for follow up in the [facility]. Patient presented to the hospital with 2 weeks of worsening SOB and edema. Found to be in new AF ranging from 80-120s.  Echo showed biventricular CHF. Started on IV heparin. Underwent TEE [facility] 2/3 but continued to have paroxysms of AF with RVR. EP asked to see for consideration of AAD. She was loaded on dofetilide. Patient is on Eliquis for stroke prevention.   Patient presents today for follow up for atrial fibrillation and dofetilide monitoring. She remains in SR today. She has noticed intermittent headaches since starting dofetilide. However, her primary concerns are about her chronic orthopedic issues.   Today, she denies symptoms of palpitations, chest pain, shortness of breath, orthopnea, PND, lower extremity edema, dizziness, presyncope, syncope, snoring, daytime somnolence, bleeding, or neurologic sequela. The patient is tolerating medications without difficulties and is otherwise without complaint today.    Atrial Fibrillation Risk Factors:  she does not have symptoms or diagnosis of sleep apnea. she does not have a history of rheumatic fever.   Atrial Fibrillation Management history:  Previous antiarrhythmic drugs: dofetilide  Previous cardioversions: 12/04/23 Previous ablations: none Anticoagulation history: Eliquis  ROS- All systems are reviewed and negative except as per the HPI above.  Past Medical History:  Diagnosis Date   Adenomatous polyp 03/23/2020   Colonoscopy 2021 notable for adenomatous polyp. Per GI, no further follow up colonoscopies recommended given patient's age.   Allergy    Arthritis    Atherosclerosis of  aorta (HCC) 07/15/2021   Candidiasis, mouth 10/19/2017   Chronic combined systolic and diastolic CHF (congestive heart failure) (HCC) 10/21/2015   Echo 3/16: EF 40%, diffuse HK, mild MR, moderate LAE, mild RAE, PASP 42 mmHg  //  Echo 12/16: Mild LVH, EF 35-40%, diffuse HK, Gr 2 DD, MAC, mild MR, mod LAE, normal RVSF, PASP 49 mmHg // Echo 5/19: EF 15-20, diff HK, Gr 2 DD, trivial AI, MAC, trivial MR, mod LAE, normal RVSF, mild TR, PASP 62 // Limited Echo 8/19: mod LVH, EF 40-45, diff HK, Gr 1 DD, MAC, mild LAE    Chronic left shoulder pain 04/10/2013   Chronic lung disease    Chronic pain    Chronic pain syndrome 07/20/2017   Confusion 06/08/2021   Depression 08/25/2016   DJD (degenerative joint disease) 10/12/2012   Multiple joint replacements by Dr. Leslee Home.    Dyshidrotic eczema 12/31/2014   Edentulous 07/19/2021   Emphysema lung (HCC) 03/02/2021   Long smoking history.  Noted on CT Low Dose Chest: 01/2021: IMPRESSION: Chronic interstitial changes raise the question of pulmonary fibrosis. High-resolution chest CT may prove helpful to further evaluate as clinically warranted.Emphysema (ICD10-J43.9) and Aortic Atherosclerosis (ICD10-170.0)  Recommended pulmonology referral but patient declined. Recommend continued discussion and symptom monitor   Essential hypertension 07/20/2017   GERD (gastroesophageal reflux disease)    Gout    H/O slipped capital femoral epiphysis (SCFE)    Heart failure, left, with LVEF 41-49% (HCC) 10/21/2015   transthoracic echocardiogram 06/06/18: EF appears improved to 40-45% with diffuse hypokinesis  A. Echo 3/16: EF 40%, diffuse HK, mild MR, moderate LAE, mild RAE, PASP 42 mmHg  //  B. Echo 12/16: Mild LVH, EF 35-40%, diffuse HK, grade 2  diastolic dysfunction, MAC, mild MR, moderate LAE, normal RVSF, PASP 49 mmHg   History of total right hip replacement 04/02/2019   Followed closely with orthopedist Dr. Charlann Boxer. Patient is s/p total right hip replacement in 1980's with evidence of  bone loss and osteopenia making reconstruction unlikely. Plan for conservative treatment at this time with crutches for ambulation and pain control.   Hyperlipidemia    Hypertension    Hypertensive heart disease with CHF (congestive heart failure) (HCC) 10/12/2012   Loss of taste 02/06/2018   Mass of breast, left 07/21/2014   NICM (nonischemic cardiomyopathy) (HCC) 06/09/2016   A. LHC 7/17: Normal coronary arteries, EF 30-35%, LVEDP 14 mmHg   Pain in gums 04/18/2018   Pre-diabetes    Pulmonary fibrosis (HCC) 03/02/2021   CT Low Dose Chest: 01/2021: IMPRESSION: Chronic interstitial changes raise the question of pulmonary fibrosis. High-resolution chest CT may prove helpful to further evaluate as clinically warranted.Emphysema (ICD10-J43.9) and Aortic Atherosclerosis (ICD10-170.0)  Recommended pulmonology referral but patient declined. Recommend continued discussion and symptom monitoring.   Pulmonary nodule 1 cm or greater in diameter 03/02/2021   CT low dose Chest: 01/2021: 1. Lung-RADS 4A, suspicious. 10 mm posterior left lower lobe pulmonary nodule, possibly scarring. Follow up low-dose chest CT without contrast in 3 months (please use the following order, "CT CHEST LCS NODULE FOLLOW-UP W/O CM") is recommended. Alternatively, PET may be considered when there is a solid component 8mm or larger.  Plan: Follow up low dose CT ordered in 3 mon   Rib pain on left side 09/17/2020   Right hip pain 11/02/2012   Tendonitis, Achilles, left 05/24/2017   Tobacco abuse 09/29/2014   Urge incontinence 07/19/2021   Vitamin D deficiency 12/31/2014    Current Outpatient Medications  Medication Sig Dispense Refill   acetaminophen (TYLENOL) 500 MG tablet Take 1,000 mg by mouth daily as needed for mild pain or moderate pain.     albuterol (VENTOLIN HFA) 108 (90 Base) MCG/ACT inhaler Inhale 1-2 puffs into the lungs every 6 (six) hours as needed for wheezing or shortness of breath. 8.5 each 3   allopurinol (ZYLOPRIM) 100 MG  tablet Take 1 tablet (100 mg total) by mouth daily. 90 tablet 2   apixaban (ELIQUIS) 5 MG TABS tablet Take 1 tablet (5 mg total) by mouth 2 (two) times daily. 60 tablet 0   baclofen (LIORESAL) 10 MG tablet TAKE 1 TABLET 3 TIMES DAILY. WILL REFILL FOR 2 MONTH SUPPLY, PLEASE SCHEDULE APPOINTMENT (Patient not taking: Reported on 12/15/2023) 270 tablet 1   [Paused] carvedilol (COREG) 3.125 MG tablet TAKE 1 TABLET BY MOUTH 2 TIMES DAILY. 180 tablet 1   colchicine 0.6 MG tablet TAKE 1 TABLET BY MOUTH EVERY DAY (Patient taking differently: Take 0.6 mg by mouth daily as needed (gout flare up).) 90 tablet 0   dapagliflozin propanediol (FARXIGA) 10 MG TABS tablet Take 1 tablet (10 mg total) by mouth daily. 30 tablet 0   dofetilide (TIKOSYN) 125 MCG capsule Take 1 capsule (125 mcg total) by mouth 2 (two) times daily. 60 capsule 0   DULoxetine (CYMBALTA) 30 MG capsule TAKE 1 CAPSULE(30 MG) BY MOUTH DAILY 30 capsule 2   ezetimibe (ZETIA) 10 MG tablet TAKE 1 TABLET BY MOUTH EVERY DAY 90 tablet 3   famotidine (PEPCID) 20 MG tablet TAKE 1 TABLET (20 MG TOTAL) BY MOUTH DAILY AS NEEDED FOR HEARTBURN OR INDIGESTION. 90 tablet 1   ferrous sulfate 325 (65 FE) MG tablet Take 1 tablet (325 mg  total) by mouth every other day. (Patient not taking: Reported on 12/15/2023) 30 tablet 0   furosemide (LASIX) 20 MG tablet TAKE 20 MG TABLET AS NEEDED TWICE PER WEEK FOR INCREASED SWELLING OR WEIGHT GAIN >5 POUNDS IN A WEEK 45 tablet 1   gabapentin (NEURONTIN) 300 MG capsule Take 1 capsule (300 mg total) by mouth at bedtime as needed. TAKE 1 CAP BY MOUTH EVERYDAY AT BEDTIME. 30 capsule 2   losartan (COZAAR) 25 MG tablet Take 1 tablet (25 mg total) by mouth daily. 30 tablet 1   Menthol, Topical Analgesic, (ICY HOT BACK EX) Apply 1 Application topically 2 (two) times daily as needed (lower leg pain, knee pain).     Multiple Vitamin (MULTIVITAMIN WITH MINERALS) TABS tablet Take by mouth 2 (two) times a week. (Patient not taking: Reported  on 12/15/2023)     naloxone [facility]) nasal spray 4 mg/0.1 mL SMARTSIG:Both Nares     oxyCODONE (OXY IR/ROXICODONE) 5 MG immediate release tablet Take 5 mg by mouth every 6 (six) hours as needed for moderate pain or severe pain.     Vitamin D, Ergocalciferol, (DRISDOL) 1.25 MG (50000 UNIT) CAPS capsule Take 50,000 Units by mouth once a week. Take on Wednesday     No current facility-administered medications for this encounter.    Physical Exam: There were no vitals taken for this visit.  GEN: Well nourished, well developed in no acute distress NECK: No JVD; No carotid bruits CARDIAC: Regular rate and rhythm, no murmurs, rubs, gallops RESPIRATORY:  Clear to auscultation without rales, wheezing or rhonchi  ABDOMEN: Soft, non-tender, non-distended EXTREMITIES:  No edema; No deformity   Wt Readings from Last 3 Encounters:  12/15/23 74.2 kg  12/08/23 74.8 kg  12/01/23 77.9 kg     EKG today demonstrates  SR, artifact V5 Vent. rate 61 BPM PR interval 162 ms QRS duration 104 ms QT/QTcB 482/485 ms   Echo 12/02/23 demonstrated   1. No evidence of LV thrombus. Left ventricular ejection fraction, by  estimation, is 20 to 25%. The left ventricle has severely decreased  function. The left ventricle demonstrates global hypokinesis. The left  ventricular internal cavity size was moderately dilated. Left ventricular diastolic parameters are indeterminate. Elevated left ventricular end-diastolic pressure.   2. Right ventricular systolic function is severely reduced. The right  ventricular size is normal. There is mildly elevated pulmonary artery  systolic pressure. The estimated right ventricular systolic pressure is  40.0 mmHg.   3. Left atrial size was moderately dilated.   4. Right atrial size was moderately dilated.   5. The mitral valve is normal in structure. Trivial mitral valve  regurgitation. No evidence of mitral stenosis.   6. The tricuspid valve is abnormal. Tricuspid valve  regurgitation is  moderate.   7. The aortic valve was not well visualized. Aortic valve regurgitation  is not visualized. No aortic stenosis is present.   8. Pulmonic valve regurgitation not assessed.   9. The inferior vena cava is normal in size with greater than 50%  respiratory variability, suggesting right atrial pressure of 3 mmHg.   Comparison(s): Changes from prior study are noted. LVEF worsened from  50-55% to 20-25% now with severe RV dysfunction.    CHA2DS2-VASc Score = 5  The patient's score is based upon: CHF History: 1 HTN History: 1 Diabetes History: 0 Stroke History: 0 Vascular Disease History: 0 Age Score: 2 Gender Score: 1       ASSESSMENT AND PLAN: Persistent Atrial Fibrillation (ICD10:  I48.19) The patient's CHA2DS2-VASc score is 5, indicating a 7.2% annual risk of stroke.   S/p DCCV 12/04/23 followed by dofetilide loading Patient appears to be maintaining SR.  Continue Eliquis 5 mg BID Continue dofetilide 125 mcg BID. Head ache is a possible side effect of dofetilide. Unless headaches are debilitating, would continue dofetilide given her very limited rhythm control options.   Secondary Hypercoagulable State (ICD10:  D68.69) The patient is at significant risk for stroke/thromboembolism based upon her CHA2DS2-VASc Score of 5.  Continue Apixaban (Eliquis). No bleeding issues.  High Risk Medication Monitoring (ICD 10: Z79.899) QT interval on ECG acceptable for dofetilide monitoring. Check bmet/mag today   HTN Elevated today, patient having significant discomfort with her hip. Her BP in the hospital and and previous office visits much better controlled. No changes today.   Chronic HFrEF EF 20-25% GDMT per [facility] and primary cardiology team. Fluid status appears stable today Not currently on BB due to bradycardia while inpatient.    Follow up in the AF clinic in one month.        Jorja Loa PA-C Afib [facility] [address] [place], [place] [address] [phone]

## 2023-12-26 ENCOUNTER — Other Ambulatory Visit: Payer: Self-pay

## 2023-12-26 ENCOUNTER — Telehealth: Payer: Self-pay

## 2023-12-26 NOTE — Patient Outreach (Signed)
 Care Management  Transitions of Care Program Transitions of Care Post-discharge week 3 /Case Closure   12/26/2023 Name: Donna Herrera MRN: [id number] DOB: 02/03/1946  Subjective: Donna Herrera is a 78 y.o. year old female who is a primary care patient of Baloch, Mahnoor, MD. The Care Management team Engaged with patient Engaged with patient by telephone to assess and address transitions of care needs.   Consent to Services:  Patient was given information about care management services, agreed to services, and gave verbal consent to participate.   Assessment:     TOC RNCM received voicemail from patient. Call back to patient who confirms she saw cardiology 12/25/23 and now has [facility] home health who was just there today and will return Thursday. Patient declined ongoing TOC calls. Patient has RNCM name/number and understands she can call with any new issues/questions/concerns.       SDOH Interventions    Flowsheet Row Telephone from 12/18/2023 in [place] [facility] ED to Hosp-Admission (Discharged) from 12/01/2023 in [facility] 3E HF PCU Clinical Support from 04/28/2023 in [facility] - A Dept Of [place] Care Coordination from 02/27/2023 in [facility] Community Care Coordination Chronic Care Management from 08/18/2021 in [facility] - A Dept Of Eligha Bridegroom. [facility]  SDOH Interventions       Food Insecurity Interventions Intervention Not Indicated -- Intervention Not Indicated Intervention Not Indicated --  Housing Interventions Intervention Not Indicated Intervention Not Indicated Intervention Not Indicated Intervention Not Indicated --  Transportation Interventions Intervention Not Indicated -- Intervention Not Indicated Intervention Not Indicated --  Utilities Interventions Intervention Not Indicated -- Intervention Not Indicated -- --  Alcohol Usage Interventions --  Intervention Not Indicated (Score <7) Intervention Not Indicated (Score <7) -- --  Financial Strain Interventions -- Intervention Not Indicated Intervention Not Indicated -- --  [A referral to pharamacy will be placed to help the patient with medication resources.]  Physical Activity Interventions -- -- Intervention Not Indicated -- --  Stress Interventions -- -- Intervention Not Indicated -- --  Social Connections Interventions -- -- Intervention Not Indicated -- --        Goals Addressed               This Visit's Progress     Patient will report no readmissions in the next 30 days (pt-stated)        Current Barriers:  Provider appointments . Patient was unaware of need to schedule hospital follow up with PCP. States she already saw cardio  Home Health services Patient was aware Home Health was ordered but was not aware of who to call for schedule Patient with new diagnosis of Atrial Fibrillation with medication changes. Patient states daughter and granddaughter assist but were not present for review.   Patient reports understanding diagnosis of CHF but reports no scale in home.  Record reflects weight of 163lbs 9.6oz from 2/14 cardio visit.    RNCM Clinical Goal(s):  Patient will gain better understanding of Afib including recognizing signs/symptoms, understanding medications, risks  Patient will better management CHF and understand rationale for daily weights and calling RN/MD with weight gain   Interventions: Patient states she is seeing MDs and has Home Health and declined ongoing participation in [facility] program    Transitions of Care:  Patient declined further engagement on this goal.12/26/23   AFIB Interventions: (Status:  Patient declined further engagement on this goal.) Short Term Goal  12/26/23 Patient states she is seeing MDs and has Home Health and declined ongoing participation in [facility] program   Patient Goals/Self-Care Activities: Patient states she is seeing MDs and  has Home Health and declined ongoing participation in [facility] program 12/26/23  Follow Up Plan:  No further follow up required: Patient declined ongoing TOC participation. Case closed          Plan: Patient states she is seeing MDs and has Home Health and declined ongoing participation in [facility] program - Case closed - No further outreach indicated.   Hilbert Odor RN, CCM [place]  VBCI-Population Health RN Care Manager [phone]

## 2023-12-26 NOTE — Patient Outreach (Signed)
 Care Management  Transitions of Care Program Transitions of Care Post-discharge week 3  12/26/2023 Name: Donna Herrera MRN: [id number] DOB: 11/11/1945  Subjective: Donna Herrera is a 78 y.o. year old female who is a primary care patient of Baloch, Mahnoor, MD. The Care Management team was unable to reach the patient by phone to assess and address transitions of care needs.   Plan: Additional outreach attempts will be made to reach the patient enrolled in the [facility] Program (Post Inpatient/ED Visit).  Hilbert Odor RN, CCM [place]  VBCI-Population Health RN Care Manager [phone]

## 2023-12-28 ENCOUNTER — Telehealth: Payer: Self-pay

## 2023-12-28 NOTE — Telephone Encounter (Signed)
 Received call from Roxy Manns regarding patient.   Rosanne Ashing reports that patient declines future PT visits.   He is requesting verbal orders for OT, medical social worker and RN evaluations. Verbal orders given per protocol.   Also reports elevation of BP. Initial 160/80, repeat 130/80. She does have intermittent lightheadedness.   Patient had recent diagnosis of Afib and was seen by Cardiology on 12/25/23.  Also reports medication discrepancies. States that patient is not taking colchicine and was not aware that she was supposed to be taking iron supplement.   Called patient to schedule follow up appointment with PCP for BP and medication reconciliation.   She did not answer, unable to LVM. If patient calls back, please assist her in scheduling follow up for BP and medication reconciliation.   Veronda Prude, RN

## 2024-01-04 ENCOUNTER — Ambulatory Visit: Payer: Medicare Other | Admitting: Family Medicine

## 2024-01-04 ENCOUNTER — Encounter: Payer: Self-pay | Admitting: Family Medicine

## 2024-01-04 ENCOUNTER — Other Ambulatory Visit: Payer: Self-pay | Admitting: Family Medicine

## 2024-01-04 ENCOUNTER — Other Ambulatory Visit: Payer: Self-pay

## 2024-01-04 VITALS — BP 142/80 | HR 63 | Wt 158.0 lb

## 2024-01-04 DIAGNOSIS — I4819 Other persistent atrial fibrillation: Secondary | ICD-10-CM | POA: Diagnosis not present

## 2024-01-04 DIAGNOSIS — I5020 Unspecified systolic (congestive) heart failure: Secondary | ICD-10-CM | POA: Insufficient documentation

## 2024-01-04 DIAGNOSIS — I501 Left ventricular failure: Secondary | ICD-10-CM

## 2024-01-04 DIAGNOSIS — I4891 Unspecified atrial fibrillation: Secondary | ICD-10-CM

## 2024-01-04 DIAGNOSIS — I10 Essential (primary) hypertension: Secondary | ICD-10-CM

## 2024-01-04 MED ORDER — LOSARTAN POTASSIUM 50 MG PO TABS: 50.0000 mg | ORAL_TABLET | Freq: Every day | ORAL | 0 refills | Status: DC

## 2024-01-04 MED ORDER — APIXABAN 5 MG PO TABS: 5.0000 mg | ORAL_TABLET | Freq: Two times a day (BID) | ORAL | 0 refills | Status: DC

## 2024-01-04 MED ORDER — EZETIMIBE 10 MG PO TABS
10.0000 mg | ORAL_TABLET | Freq: Every day | ORAL | 3 refills | Status: AC
Start: 2024-01-04 — End: ?

## 2024-01-04 MED ORDER — DOFETILIDE 125 MCG PO CAPS: 125.0000 ug | ORAL_CAPSULE | Freq: Two times a day (BID) | ORAL | 1 refills | Status: DC

## 2024-01-04 MED ORDER — FUROSEMIDE 20 MG PO TABS: ORAL_TABLET | ORAL | 1 refills | Status: DC

## 2024-01-04 MED ORDER — DAPAGLIFLOZIN PROPANEDIOL 10 MG PO TABS: 10.0000 mg | ORAL_TABLET | Freq: Every day | ORAL | 0 refills | Status: DC

## 2024-01-04 NOTE — Assessment & Plan Note (Signed)
>>  ASSESSMENT AND PLAN FOR LONGSTANDING PERSISTENT ATRIAL FIBRILLATION (HCC) WRITTEN ON 01/04/2024  2:39 PM BY Farris Hong, MD  Patient currently on Tikosyn  and Eliquis . Followed by cardiology and A fib clinic. EKG in clinic today showed sinus arhythmia with prolonged Qtc of ~480 ms. Patient is not having any current symptoms. Has follow up with cardiology scheduled.

## 2024-01-04 NOTE — Progress Notes (Signed)
    SUBJECTIVE:   CHIEF COMPLAINT / HPI:   Patient presents for hospital follow up. Admitted to [facility] from 12/01/23-12/08/23 for new onset A fib. Patient was started on Tikosyn during hospitalization. Patient is following up with cardiology, and A fib clinic. Patient is currently taking tikosyn 125 mcg, losartan 25mg , farxiga 10 mg, eliquis 5 mg BID and lasix 20 mg MWF. She is not taking coreg. Reports SOB is better since discharge, but she is having some headaches and some increased BP. She was seen by A fib clinic who believe headache is side effect of tikosyn.   PERTINENT  PMH / PSH:  A fib (diagnosed at last hospitalization) Systolic HF HTN Chronic Pain on Opiods Gout Anxiety/Depression Neuropathy GERD  HLD   OBJECTIVE:   BP (!) 142/80   Pulse 63   Wt 158 lb (71.7 kg)   SpO2 99%   BMI 25.50 kg/m   General: A&O, NAD HEENT: No sign of trauma, EOM grossly intact Cardiac: irregular rhytmn, no m/r/g Respiratory: fine crackles at bilateral lung bases, NWOB on RA  GI: Soft, NTTP, non-distended  Extremities: NTTP, no peripheral edema. Neuro: Walks with crutches, no acute neurological deficits  Psych: Appropriate mood and affect   ASSESSMENT/PLAN:   Persistent atrial fibrillation (HCC) Patient currently on Tikosyn and Eliquis. Followed by cardiology and A fib clinic. EKG in clinic today showed sinus arhythmia with prolonged Qtc of ~480 ms. Patient is not having any current symptoms. Has follow up with cardiology scheduled.   Essential hypertension Hypertensive today. Increased losartan to 50 mg daily. Follow up in 4-6 weeks.  HFrEF (heart failure with reduced ejection fraction) (HCC) Last echo 12/2023 with EF < 20%. Following with cardiology. Currently on farxiga 10mg , losartan 50mg , and lasix 20 mg MWF. Is not on any betablocker due to bradycardia. Will follow up with cardiology to discuss dosing of lasix and initiation of coreg.     [name], MD [facility]

## 2024-01-04 NOTE — Assessment & Plan Note (Addendum)
 Patient currently on Tikosyn and Eliquis. Followed by cardiology and A fib clinic. EKG in clinic today showed sinus arhythmia with prolonged Qtc of ~480 ms. Patient is not having any current symptoms. Has follow up with cardiology scheduled.

## 2024-01-04 NOTE — Assessment & Plan Note (Signed)
 Hypertensive today. Increased losartan to 50 mg daily. Follow up in 4-6 weeks.

## 2024-01-04 NOTE — Patient Instructions (Signed)
 It was great to see you today! Thank you for choosing [facility] for your primary care. Donna Herrera was seen for hospital follow up.  Today we addressed: I have sent a message to your A fib and cardiology doctors to clarify your medications. I will update you with what they say if there are any changes they want to make before your appointment. I have increased your losartan to 50mg  daily. I sent this script into the pharmacy. I refilled your medication, if you are unable to afford these medications, please call our office.   If you haven't already, sign up for My Chart to have easy access to your labs results, and communication with your primary care physician.  We are checking some labs today. If they are abnormal, I will call you. If they are normal, I will send you a MyChart message (if it is active) or a letter in the mail. If you do not hear about your labs in the next 2 weeks, please call the office.   You should return to our clinic in four weeks AS needed   I recommend that you always bring your medications to each appointment as this makes it easy to ensure you are on the correct medications and helps [name] not miss refills when you need them.  Please arrive 15 minutes before your appointment to ensure smooth check in process.  We appreciate your efforts in making this happen.  Please call the clinic at [phone] if your symptoms worsen or you have any concerns.  Thank you for allowing me to participate in your care, Donna Morales, MD 01/04/2024, 2:24 PM PGY-1, [facility]

## 2024-01-04 NOTE — Assessment & Plan Note (Addendum)
 Last echo 12/2023 with EF < 20%. Following with cardiology. Currently on farxiga 10mg , losartan 50mg , and lasix 20 mg MWF. Is not on any betablocker due to bradycardia. Will follow up with cardiology to discuss dosing of lasix and initiation of coreg.

## 2024-01-05 ENCOUNTER — Ambulatory Visit: Payer: Medicare Other | Admitting: Student

## 2024-01-22 ENCOUNTER — Encounter (HOSPITAL_COMMUNITY): Payer: Self-pay | Admitting: Physician Assistant

## 2024-01-22 ENCOUNTER — Ambulatory Visit (HOSPITAL_COMMUNITY)
Admission: RE | Admit: 2024-01-22 | Discharge: 2024-01-22 | Disposition: A | Discharge status: Home / Self Care | Payer: Medicare Other | Source: Ambulatory Visit | Attending: Physician Assistant | Admitting: Physician Assistant

## 2024-01-22 VITALS — BP 128/90 | HR 73 | Ht 66.0 in | Wt 162.0 lb

## 2024-01-22 DIAGNOSIS — I110 Hypertensive heart disease with heart failure: Secondary | ICD-10-CM | POA: Insufficient documentation

## 2024-01-22 DIAGNOSIS — I4819 Other persistent atrial fibrillation: Secondary | ICD-10-CM

## 2024-01-22 DIAGNOSIS — Z7901 Long term (current) use of anticoagulants: Secondary | ICD-10-CM | POA: Insufficient documentation

## 2024-01-22 DIAGNOSIS — Z79899 Other long term (current) drug therapy: Secondary | ICD-10-CM | POA: Diagnosis not present

## 2024-01-22 DIAGNOSIS — D6869 Other thrombophilia: Secondary | ICD-10-CM

## 2024-01-22 DIAGNOSIS — I5022 Chronic systolic (congestive) heart failure: Secondary | ICD-10-CM | POA: Insufficient documentation

## 2024-01-22 DIAGNOSIS — Z5181 Encounter for therapeutic drug level monitoring: Secondary | ICD-10-CM

## 2024-01-22 LAB — MAGNESIUM: Magnesium: 2.1 mg/dL (ref 1.7–2.4)

## 2024-01-22 LAB — BASIC METABOLIC PANEL
Anion gap: 10 (ref 5–15)
BUN: 6 mg/dL — ABNORMAL LOW (ref 8–23)
CO2: 25 mmol/L (ref 22–32)
Calcium: 9 mg/dL (ref 8.9–10.3)
Chloride: 103 mmol/L (ref 98–111)
Creatinine, Ser: 0.71 mg/dL (ref 0.44–1.00)
GFR, Estimated: >60 mL/min (ref >60)
Glucose, Bld: 83 mg/dL (ref 70–99)
Potassium: 3.4 mmol/L — ABNORMAL LOW (ref 3.5–5.1)
Sodium: 138 mmol/L (ref 135–145)

## 2024-01-22 NOTE — Progress Notes (Signed)
 Primary Care Physician: Penne Lash, MD Primary Cardiologist: Dietrich Pates, MD Electrophysiologist: Lanier Prude, MD  Referring Physician: Dr Melonie Florida is a 78 y.o. female with a history of HTN, pulmonary fibrosis, HFrEF, HLD, atrial fibrillation who presents for follow up in the [facility]. Patient presented to the hospital with 2 weeks of worsening SOB and edema. Found to be in new AF ranging from 80-120s.  Echo showed biventricular CHF. Started on IV heparin. Underwent TEE [facility] 2/3 but continued to have paroxysms of AF with RVR. EP asked to see for consideration of AAD. She was loaded on dofetilide. Patient is on Eliquis for stroke prevention.   Patient returns for follow up for atrial fibrillation and dofetilide monitoring. Patient remains in SR today. No bleeding issues on anticoagulation. Her hip pain continues to limit her physical activity. Her headaches have not improved.   Today, he denies symptoms of palpitations, chest pain, orthopnea, PND, lower extremity edema, dizziness, presyncope, syncope, snoring, daytime somnolence, bleeding, or neurologic sequela. The patient is tolerating medications without difficulties and is otherwise without complaint today.    Atrial Fibrillation Risk Factors:  she does not have symptoms or diagnosis of sleep apnea. she does not have a history of rheumatic fever.   Atrial Fibrillation Management history:  Previous antiarrhythmic drugs: dofetilide  Previous cardioversions: 12/04/23 Previous ablations: none Anticoagulation history: Eliquis  ROS- All systems are reviewed and negative except as per the HPI above.  Past Medical History:  Diagnosis Date   Adenomatous polyp 03/23/2020   Colonoscopy 2021 notable for adenomatous polyp. Per GI, no further follow up colonoscopies recommended given patient's age.   Allergy    Arthritis    Atherosclerosis of aorta (HCC) 07/15/2021    Candidiasis, mouth 10/19/2017   Chronic combined systolic and diastolic CHF (congestive heart failure) (HCC) 10/21/2015   Echo 3/16: EF 40%, diffuse HK, mild MR, moderate LAE, mild RAE, PASP 42 mmHg  //  Echo 12/16: Mild LVH, EF 35-40%, diffuse HK, Gr 2 DD, MAC, mild MR, mod LAE, normal RVSF, PASP 49 mmHg // Echo 5/19: EF 15-20, diff HK, Gr 2 DD, trivial AI, MAC, trivial MR, mod LAE, normal RVSF, mild TR, PASP 62 // Limited Echo 8/19: mod LVH, EF 40-45, diff HK, Gr 1 DD, MAC, mild LAE    Chronic left shoulder pain 04/10/2013   Chronic lung disease    Chronic pain    Chronic pain syndrome 07/20/2017   Confusion 06/08/2021   Depression 08/25/2016   DJD (degenerative joint disease) 10/12/2012   Multiple joint replacements by Dr. Leslee Home.    Dyshidrotic eczema 12/31/2014   Edentulous 07/19/2021   Emphysema lung (HCC) 03/02/2021   Long smoking history.  Noted on CT Low Dose Chest: 01/2021: IMPRESSION: Chronic interstitial changes raise the question of pulmonary fibrosis. High-resolution chest CT may prove helpful to further evaluate as clinically warranted.Emphysema (ICD10-J43.9) and Aortic Atherosclerosis (ICD10-170.0)  Recommended pulmonology referral but patient declined. Recommend continued discussion and symptom monitor   Essential hypertension 07/20/2017   GERD (gastroesophageal reflux disease)    Gout    H/O slipped capital femoral epiphysis (SCFE)    Heart failure, left, with LVEF 41-49% (HCC) 10/21/2015   transthoracic echocardiogram 06/06/18: EF appears improved to 40-45% with diffuse hypokinesis  A. Echo 3/16: EF 40%, diffuse HK, mild MR, moderate LAE, mild RAE, PASP 42 mmHg  //  B. Echo 12/16: Mild LVH, EF 35-40%, diffuse HK, grade 2 diastolic dysfunction, MAC,  mild MR, moderate LAE, normal RVSF, PASP 49 mmHg   History of total right hip replacement 04/02/2019   Followed closely with orthopedist Dr. Charlann Boxer. Patient is s/p total right hip replacement in 1980's with evidence of bone loss and osteopenia  making reconstruction unlikely. Plan for conservative treatment at this time with crutches for ambulation and pain control.   Hyperlipidemia    Hypertension    Hypertensive heart disease with CHF (congestive heart failure) (HCC) 10/12/2012   Loss of taste 02/06/2018   Mass of breast, left 07/21/2014   NICM (nonischemic cardiomyopathy) (HCC) 06/09/2016   A. LHC 7/17: Normal coronary arteries, EF 30-35%, LVEDP 14 mmHg   Pain in gums 04/18/2018   Pre-diabetes    Pulmonary fibrosis (HCC) 03/02/2021   CT Low Dose Chest: 01/2021: IMPRESSION: Chronic interstitial changes raise the question of pulmonary fibrosis. High-resolution chest CT may prove helpful to further evaluate as clinically warranted.Emphysema (ICD10-J43.9) and Aortic Atherosclerosis (ICD10-170.0)  Recommended pulmonology referral but patient declined. Recommend continued discussion and symptom monitoring.   Pulmonary nodule 1 cm or greater in diameter 03/02/2021   CT low dose Chest: 01/2021: 1. Lung-RADS 4A, suspicious. 10 mm posterior left lower lobe pulmonary nodule, possibly scarring. Follow up low-dose chest CT without contrast in 3 months (please use the following order, "CT CHEST LCS NODULE FOLLOW-UP W/O CM") is recommended. Alternatively, PET may be considered when there is a solid component 8mm or larger.  Plan: Follow up low dose CT ordered in 3 mon   Rib pain on left side 09/17/2020   Right hip pain 11/02/2012   Tendonitis, Achilles, left 05/24/2017   Tobacco abuse 09/29/2014   Urge incontinence 07/19/2021   Vitamin D deficiency 12/31/2014    Current Outpatient Medications  Medication Sig Dispense Refill   acetaminophen (TYLENOL) 500 MG tablet Take 1,000 mg by mouth daily as needed for mild pain or moderate pain.     albuterol (VENTOLIN HFA) 108 (90 Base) MCG/ACT inhaler Inhale 1-2 puffs into the lungs every 6 (six) hours as needed for wheezing or shortness of breath. 8.5 each 3   allopurinol (ZYLOPRIM) 100 MG tablet Take 1 tablet (100 mg  total) by mouth daily. 90 tablet 2   apixaban (ELIQUIS) 5 MG TABS tablet Take 1 tablet (5 mg total) by mouth 2 (two) times daily. 60 tablet 0   colchicine 0.6 MG tablet TAKE 1 TABLET BY MOUTH EVERY DAY (Patient taking differently: Take 0.6 mg by mouth daily as needed (gout flare up).) 90 tablet 0   dapagliflozin propanediol (FARXIGA) 10 MG TABS tablet Take 1 tablet (10 mg total) by mouth daily. 30 tablet 0   dofetilide (TIKOSYN) 125 MCG capsule Take 1 capsule (125 mcg total) by mouth 2 (two) times daily. 180 capsule 1   DULoxetine (CYMBALTA) 30 MG capsule TAKE 1 CAPSULE(30 MG) BY MOUTH DAILY 30 capsule 2   ezetimibe (ZETIA) 10 MG tablet Take 1 tablet (10 mg total) by mouth daily. 90 tablet 3   famotidine (PEPCID) 20 MG tablet TAKE 1 TABLET (20 MG TOTAL) BY MOUTH DAILY AS NEEDED FOR HEARTBURN OR INDIGESTION. 90 tablet 1   ferrous sulfate 325 (65 FE) MG tablet Take 1 tablet (325 mg total) by mouth every other day. 30 tablet 0   furosemide (LASIX) 20 MG tablet Take MWF 45 tablet 1   gabapentin (NEURONTIN) 300 MG capsule Take 1 capsule (300 mg total) by mouth at bedtime as needed. TAKE 1 CAP BY MOUTH EVERYDAY AT BEDTIME. 30 capsule 2  losartan (COZAAR) 50 MG tablet Take 1 tablet (50 mg total) by mouth daily. 30 tablet 0   Menthol, Topical Analgesic, (ICY HOT BACK EX) Apply 1 Application topically 2 (two) times daily as needed (lower leg pain, knee pain).     Multiple Vitamin (MULTIVITAMIN WITH MINERALS) TABS tablet Take 1 tablet by mouth every morning.     naloxone (NARCAN) nasal spray 4 mg/0.1 mL SMARTSIG:Both Nares     oxyCODONE (OXY IR/ROXICODONE) 5 MG immediate release tablet Take 5 mg by mouth every 6 (six) hours as needed for moderate pain or severe pain.     Vitamin D, Ergocalciferol, (DRISDOL) 1.25 MG (50000 UNIT) CAPS capsule Take 50,000 Units by mouth once a week. Take on Wednesday     No current facility-administered medications for this encounter.    Physical Exam: BP (!) 128/90    Pulse 73   Ht 5\' 6"  (1.676 m)   Wt 73.5 kg   BMI 26.15 kg/m   GEN: Well nourished, well developed in no acute distress NECK: No JVD; No carotid bruits CARDIAC: Regular rate and rhythm with occasional ectopy, no murmurs, rubs, gallops RESPIRATORY:  Clear to auscultation without rales, wheezing or rhonchi  ABDOMEN: Soft, non-tender, non-distended EXTREMITIES:  No edema; No deformity    Wt Readings from Last 3 Encounters:  01/22/24 73.5 kg  01/04/24 71.7 kg  12/25/23 72.6 kg     EKG today demonstrates  SR, PVC Vent. rate 73 BPM PR interval 156 ms QRS duration 106 ms QT/QTcB 398/438 ms   Echo 12/02/23 demonstrated   1. No evidence of LV thrombus. Left ventricular ejection fraction, by  estimation, is 20 to 25%. The left ventricle has severely decreased  function. The left ventricle demonstrates global hypokinesis. The left  ventricular internal cavity size was moderately dilated. Left ventricular diastolic parameters are indeterminate. Elevated left ventricular end-diastolic pressure.   2. Right ventricular systolic function is severely reduced. The right  ventricular size is normal. There is mildly elevated pulmonary artery  systolic pressure. The estimated right ventricular systolic pressure is  40.0 mmHg.   3. Left atrial size was moderately dilated.   4. Right atrial size was moderately dilated.   5. The mitral valve is normal in structure. Trivial mitral valve  regurgitation. No evidence of mitral stenosis.   6. The tricuspid valve is abnormal. Tricuspid valve regurgitation is  moderate.   7. The aortic valve was not well visualized. Aortic valve regurgitation  is not visualized. No aortic stenosis is present.   8. Pulmonic valve regurgitation not assessed.   9. The inferior vena cava is normal in size with greater than 50%  respiratory variability, suggesting right atrial pressure of 3 mmHg.   Comparison(s): Changes from prior study are noted. LVEF worsened from   50-55% to 20-25% now with severe RV dysfunction.    CHA2DS2-VASc Score = 5  The patient's score is based upon: CHF History: 1 HTN History: 1 Diabetes History: 0 Stroke History: 0 Vascular Disease History: 0 Age Score: 2 Gender Score: 1       ASSESSMENT AND PLAN: Persistent Atrial Fibrillation (ICD10:  I48.19) The patient's CHA2DS2-VASc score is 5, indicating a 7.2% annual risk of stroke.   S/p dofetilide loading 12/2023 Patient appears to be maintaining SR Continue Eliquis 5 mg BID Continue dofetilide 125 mcg BID for now  Secondary Hypercoagulable State (ICD10:  D68.69) The patient is at significant risk for stroke/thromboembolism based upon her CHA2DS2-VASc Score of 5.  Continue Apixaban (  Eliquis). No bleeding issues.  High Risk Medication Monitoring (ICD 10: Z79.899) QT interval on ECG acceptable for dofetilide monitoring. Check bmet/mag today. Her headaches began when she was hospitalized and have not improved. Headache is listed as a possible side effect of dofetilide in 11% of patients. Will have her follow up with Dr Lalla Brothers to discuss options. I think her options for rhythm control are quite limited.     HTN Stable on current regimen  Chronic HFrEF EF 20-25% GDMT per primary cardiology team Not currently on BB due to bradycardia Fluid status appears stable today   Follow up with Tereso Newcomer as scheduled. AF clinic in 3 months.       Jorja Loa PA-C Afib Clinic [facility] [address] [place], [place] [address] [phone]

## 2024-01-25 ENCOUNTER — Encounter (HOSPITAL_COMMUNITY): Payer: Self-pay

## 2024-01-25 ENCOUNTER — Other Ambulatory Visit (HOSPITAL_COMMUNITY): Payer: Self-pay | Admitting: *Deleted

## 2024-01-25 MED ORDER — POTASSIUM CHLORIDE ER 10 MEQ PO TBCR: 10.0000 meq | EXTENDED_RELEASE_TABLET | Freq: Every day | ORAL | 3 refills | Status: DC

## 2024-01-29 NOTE — Progress Notes (Unsigned)
 Cardiology Office Note:    Date:  01/30/2024  ID:  [name], DOB [birth year], MRN [id number] PCP: Penne Lash, MD  [facility] Providers Cardiologist:  Dietrich Pates, MD Electrophysiologist:  Lanier Prude, MD       Patient Profile:      HFmrEF (heart failure with mildly reduced ejection fraction) TTE 10/2015: EF 35-40  TTE 06/06/18:  Mod LVH, EF 40-45, diff HK, Gr 1 DD, MAC, trivial MR, mild LAE, trivial TR  TTE 02/14/23: EF 50-55, no RWMA, Gr 1 DD, NL RVSF TTE 12/02/2023: EF 20-25, global HK, severely reduced RVSF, RVSP 40, mild pulmonary hypertension, moderate BAE, trivial MR, moderate TR TEE 12/04/2023: EF <20 severely reduced RVSF, severe RAE, no LAA clot, moderate MR, mild to moderate TR, trivial AI Non-ischemic cardiomyopathy  Cath 05/11/16: no CAD  Persistent atrial fibrillation  Admitted 11/2023 w RVR - s/p TEE DCCV Dofetilide Rx  Hypertension  Hyperlipidemia  Tobacco use ILD (Interstitial Lung Disease)  Hx of slipped capital femoral epiphysis syndrome  S/p multiple hip surgeries   Aortic atherosclerosis        Discussed the use of AI scribe software for clinical note transcription with the patient, who gave verbal consent to proceed.  History of Present Illness Donna Herrera is a 78 y.o. female who returns for follow up of CHF, AFib. She was last seen in 09/2023. She was admitted in 11/2023 with new onset atrial fibrillation with RVR. TTE showed that her EF was reduced again at 20-25. She was placed on Eliquis and underwent TEE DCCV. She had episodes of recurrent AFib and was seen by EP. She was started on Dofetilide for rhythm control. Carvedilol was DC'd due to bradycardia. GDMT was limited due to low BP. She was started on Losartan and [place]. She could not tolerate Entresto due to low BP. Hydralazine was DC'd.  She had follow-up in the CHF [facility] clinic 12/15/2023.  Overall, it was felt that her reduced EF was likely related to tachycardia.   She had follow-up in A-fib clinic in February and again 01/22/2024.  She was maintaining sinus rhythm on dofetilide.  Of note, she has been having headaches since being started on dofetilide which is a potential side effect.  She has follow-up with Dr. Lalla Brothers 01/31/24.  Recent potassium was low at 3.4 and she was placed on potassium supplementation.  She experiences shortness of breath, particularly when lying flat, and has to prop herself up to breathe comfortably. She also reports episodes of chest soreness, especially with deep breaths, and occasional cramping in her chest. She gets tired quickly with activities such as cooking and has to sit down to catch her breath. No episodes of passing out or bleeding. She reports experiencing headaches since starting dofetilide.   ROS-See HPI    Studies Reviewed:   EKG Interpretation Date/Time:  Tuesday January 30 2024 11:02:12 EDT Ventricular Rate:  71 PR Interval:  334 QRS Duration:  112 QT Interval:  412 QTC Calculation: 447 R Axis:   -34  Text Interpretation: Sinus rhythm with 1st degree A-V block Left axis deviation Confirmed by Tereso Newcomer [phone]) on 01/30/2024 11:07:14 AM    EKG #1: AFib, HR 147 Results Labs-chart review 09/28/2023: ALT 10 11/03/23: Cholesterol 220 HDL 68 triglycerides 78 LDL 138 12/15/2023 Hgb 12.4 01/22/2024: Potassium 3.4, creatinine 0.71    Risk Assessment/Calculations:    CHA2DS2-VASc Score = 5   This indicates a 7.2% annual risk of stroke. The patient's score is based  upon: CHF History: 1 HTN History: 1 Diabetes History: 0 Stroke History: 0 Vascular Disease History: 0 Age Score: 2 Gender Score: 1           Physical Exam:   VS:  BP (!) 141/80   Pulse 78   Ht 5\' 6"  (1.676 m)   Wt 165 lb 6.4 oz (75 kg)   BMI 26.70 kg/m    Wt Readings from Last 3 Encounters:  01/30/24 165 lb 6.4 oz (75 kg)  01/22/24 162 lb (73.5 kg)  01/04/24 158 lb (71.7 kg)    Constitutional:      Appearance: Healthy appearance.  Not in distress.  Neck:     Vascular: JVD elevated.  Pulmonary:     Breath sounds: Normal breath sounds. No wheezing. No rales.  Cardiovascular:     Tachycardia present. Irregularly irregular rhythm.     Murmurs: There is no murmur.  Edema:    Peripheral edema absent.  Abdominal:     Palpations: Abdomen is soft.  Skin:    General: Skin is warm and dry.        Assessment and Plan:   Assessment & Plan Persistent atrial fibrillation [facility]) She was admitted in Jan 2025 with new onset AF w RVR. She underwent DCCV but continued to have paroxysms of AFib. She was placed on Dofetilide and has followed up with the AFib Clinic x 2 since DC. She was in NSR both times. Today, she was initially in AFib w RVR with HRs in the 140s. She was fairly asymptomatic but does note symptoms of fatigue and "chest cramping" as well as rapid palpitations from time to time. Some days she does not have these symptoms. A repeat EKG while she was in the office today showed NSR. So, she is have intermittent atrial fibrillation with rapid rate that is likely fairly frequent despite dofetilide Rx. She has been having HAs and is seeing Dr. Chryl Heck tomorrow. I reviewed her 1st EKG today with Dr. Tenny Craw and we questioned if this could be AFlutter as it was fairly regular. Of note, her K+ was recently low. She was started on K+ -Continue Eliquis 5 mg twice daily  -Continue Dofetilide 125 mcg twice daily  -Start Metoprolol tartrate 25 mg twice daily  -BMET today -Keep follow up with Dr. Lalla Brothers tomorrow.  HFrEF (heart failure with reduced ejection fraction) (HCC) EF has improved in the past on GDMT. Last TTE in 01/2023 showed EF 50-55. While admitted in Jan 2025, TTE showed her EF had gone down again to < 20. This was likely related to tachycardia. Some of her meds were stopped due to to hypotension. She had a hard time tolerating ACE/ARB in the past but seems to be tolerating Losartan well. She was previously on Hydralazine but is  off of that now. She was on Spironolactone but is also off of that now. Will need to continue to titrate GDMT and repeat a Limited TTE to recheck her EF at some point. Cath in 2017 showed no CAD. She has had some chest pain but this sounds like symptoms related to AFib. However, if EF remains down despite optimal GDMT and maintenance of NSR, will need to consider CCTA to rule out CAD. -Continue Farxiga 10 mg once daily -Continue Losartan 50 mg once daily  -Continue Furosemide 20 mg Mon, Wed, Fri -Continue K+ 10 mEq once daily  -BMET today. If K+ still low, add Spironolactone back -Start Metoprolol tartrate 25 mg twice daily >> eventually transition  to Metoprolol succinate  -She could not tolerate Entresto in the hospital due to low BP. If BP remains elevated, consider changing ARB to [facility]  Essential hypertension BP borderline elevated. Continue Losartan 50 mg once daily. Add metoprolol tartrate 25 mg twice daily for rate control.       Dispo:  Return in about 4 weeks (around 02/27/2024) for Routine Follow Up, w/ Tereso Newcomer, PA-C.  Signed, Tereso Newcomer, PA-C

## 2024-01-30 ENCOUNTER — Ambulatory Visit: Payer: Medicare Other | Attending: Physician Assistant | Admitting: Physician Assistant

## 2024-01-30 ENCOUNTER — Other Ambulatory Visit: Payer: Self-pay | Admitting: Physician Assistant

## 2024-01-30 ENCOUNTER — Encounter: Payer: Self-pay | Admitting: Physician Assistant

## 2024-01-30 VITALS — BP 141/80 | HR 78 | Ht 66.0 in | Wt 165.4 lb

## 2024-01-30 DIAGNOSIS — I5020 Unspecified systolic (congestive) heart failure: Secondary | ICD-10-CM

## 2024-01-30 DIAGNOSIS — I4819 Other persistent atrial fibrillation: Secondary | ICD-10-CM

## 2024-01-30 DIAGNOSIS — I10 Essential (primary) hypertension: Secondary | ICD-10-CM

## 2024-01-30 MED ORDER — METOPROLOL TARTRATE 25 MG PO TABS: 25.0000 mg | ORAL_TABLET | Freq: Two times a day (BID) | ORAL | 3 refills | Status: DC

## 2024-01-30 MED ORDER — METOPROLOL TARTRATE 25 MG PO TABS
25.0000 mg | ORAL_TABLET | Freq: Three times a day (TID) | ORAL | 3 refills | Status: DC
Start: 2024-01-30 — End: 2024-01-30

## 2024-01-30 NOTE — Assessment & Plan Note (Signed)
>>  ASSESSMENT AND PLAN FOR LONGSTANDING PERSISTENT ATRIAL FIBRILLATION (HCC) WRITTEN ON 01/30/2024  1:31 PM BY Macauley Mossberg T, PA-C  She was admitted in Jan 2025 with new onset AF w RVR. She underwent DCCV but continued to have paroxysms of AFib. She was placed on Dofetilide  and has followed up with the AFib Clinic x 2 since DC. She was in NSR both times. Today, she was initially in AFib w RVR with HRs in the 140s. She was fairly asymptomatic but does note symptoms of fatigue and "chest cramping" as well as rapid palpitations from time to time. Some days she does not have these symptoms. A repeat EKG while she was in the office today showed NSR. So, she is have intermittent atrial fibrillation with rapid rate that is likely fairly frequent despite dofetilide  Rx. She has been having HAs and is seeing Dr. Arturo Late tomorrow. I reviewed her 1st EKG today with Dr. Avanell Bob and we questioned if this could be AFlutter as it was fairly regular. Of note, her K+ was recently low. She was started on K+ -Continue Eliquis  5 mg twice daily  -Continue Dofetilide  125 mcg twice daily  -Start Metoprolol  tartrate 25 mg twice daily  -BMET today -Keep follow up with Dr. Marven Slimmer tomorrow.

## 2024-01-30 NOTE — H&P (View-Only) (Signed)
 Electrophysiology Office Note:    Date:  01/31/2024   ID:  Donna Herrera, DOB 16-Feb-1946, MRN [id number]  [facility] Cardiologist:  Dietrich Pates, MD  [facility] Electrophysiologist:  Lanier Prude, MD   Referring MD: Penne Lash, MD   Chief Complaint: Atrial fibrillation  History of Present Illness:    Donna Herrera is a 78 year old woman who I am seeing today for an evaluation of atrial fibrillation at the request of Tereso Newcomer, PA-C.    I last saw the patient in February of this year when she was hospitalized for Tikosyn load.    The patient has a history of chronic systolic heart failure with an EF less than 20% on an echo in February of this year.  Her medical history also includes persistent atrial fibrillation on Tikosyn, hypertension, hyperlipidemia, tobacco use, interstitial lung disease and aortic atherosclerosis.  She is with family today in clinic.  She continues to be very symptomatic with fatigue.  Some nighttime shortness of breath.    Their past medical, social and family history was reviewed.   ROS:   Please see the history of present illness.    All other systems reviewed and are negative.  EKGs/Labs/Other Studies Reviewed:    The following studies were reviewed today:  January 30, 2024 EKG shows atrial flutter, PVC  January 22, 2024 EKG shows sinus rhythm, PAC with a right bundle aberrancy, QTc of 438 ms EKG Interpretation Date/Time:  Wednesday January 31 2024 15:42:02 EDT Ventricular Rate:  77 PR Interval:    QRS Duration:  114 QT Interval:  436 QTC Calculation: 493 R Axis:   -36  Text Interpretation: Atrial fibrillation Confirmed by Steffanie Dunn [phone]) on 01/31/2024 4:19:39 PM    Physical Exam:    VS:  BP 134/80   Pulse 77   Ht 5\' 6"  (1.676 m)   Wt 165 lb (74.8 kg)   SpO2 95%   BMI 26.63 kg/m     Wt Readings from Last 3 Encounters:  01/31/24 165 lb (74.8 kg)  01/30/24 165 lb 6.4 oz (75 kg)  01/22/24 162 lb  (73.5 kg)     GEN: no distress..  No distress laying at 30 degrees on exam table. CARD: Irregularly irregular, No MRG RESP: No IWOB. CTAB.        ASSESSMENT AND PLAN:    1. Persistent atrial fibrillation (HCC)   2. Heart failure with improved ejection fraction (HFimpEF) (HCC)   3. Hypertension, unspecified type   4. Other hyperlipidemia   5. Prediabetes   6. SOB (shortness of breath)     # Permanent atrial fibrillation #High risk med monitoring-dofetilide  The patient has atrial fibrillation associated with systolic heart failure.  She has been managed with Tikosyn although this has proven not completely effective.  She is on Eliquis for stroke prophylaxis.  I discussed options during today's clinic appointment.  For now, I would like her to stop the Tikosyn.  I suspect stopping the Tikosyn will help the headaches which may improve her overall symptomatology.  I discussed amiodarone and its potential toxicities including pulmonary toxicity and the patient is very clear that she is not interested in trying amiodarone.  We discussed pursuing CRT-P implant followed by AV junction ablation.  She would like to proceed with this strategy.  She understands that there is no guarantee that this will improve her symptoms.  We discussed the role of defibrillator therapy but given the patient's comorbidities and age, I do not think  she is a good candidate.  Would plan for CRT-P.  She will need to hold her Eliquis for 3 days prior to implant.  Risks, benefits, and alternatives to CRT-P were discussed in detail today.  The patient understands that risks include but are not limited to bleeding, infection, pneumothorax, perforation, tamponade, vascular damage, renal failure, MI, stroke, death, inappropriate shocks and lead dislodgement and wishes to proceed.  We will therefore schedule the procedure at the next available time.  Will wait 6 to 8 weeks before pursuing AV junction ablation after CRT-D  implant.  Risk, benefits, and alternatives to AV junction ablation were also discussed in detail today. These risks include but are not limited to stroke, bleeding, vascular damage, tamponade, perforation, damage to the heart and other structures, worsening renal function, and death. The patient understands these risk and wishes to proceed.     Signed, Rossie Muskrat. Lalla Brothers, MD, [facility], [facility] 01/31/2024 8:03 PM    Electrophysiology [facility]

## 2024-01-30 NOTE — Assessment & Plan Note (Signed)
 She was admitted in Jan 2025 with new onset AF w RVR. She underwent DCCV but continued to have paroxysms of AFib. She was placed on Dofetilide and has followed up with the AFib Clinic x 2 since DC. She was in NSR both times. Today, she was initially in AFib w RVR with HRs in the 140s. She was fairly asymptomatic but does note symptoms of fatigue and "chest cramping" as well as rapid palpitations from time to time. Some days she does not have these symptoms. A repeat EKG while she was in the office today showed NSR. So, she is have intermittent atrial fibrillation with rapid rate that is likely fairly frequent despite dofetilide Rx. She has been having HAs and is seeing Dr. Chryl Heck tomorrow. I reviewed her 1st EKG today with Dr. Tenny Craw and we questioned if this could be AFlutter as it was fairly regular. Of note, her K+ was recently low. She was started on K+ -Continue Eliquis 5 mg twice daily  -Continue Dofetilide 125 mcg twice daily  -Start Metoprolol tartrate 25 mg twice daily  -BMET today -Keep follow up with Dr. Lalla Brothers tomorrow.

## 2024-01-30 NOTE — Patient Instructions (Addendum)
 Medication Instructions:  Your physician has recommended you make the following change in your medication:   START Metoprolol Tartrate 25 mg taking 1 twice a day *If you need a refill on your cardiac medications before your next appointment, please call your pharmacy*  Lab Work: TODAY:  BMET  If you have labs (blood work) drawn today and your tests are completely normal, you will receive your results only by: MyChart Message (if you have MyChart) OR A paper copy in the mail If you have any lab test that is abnormal or we need to change your treatment, we will call you to review the results.  Testing/Procedures: None ordered  Follow-Up: At [facility], you and your health needs are our priority.  As part of our continuing mission to provide you with exceptional heart care, our providers are all part of one team.  This team includes your primary Cardiologist (physician) and Advanced Practice Providers or APPs (Physician Assistants and Nurse Practitioners) who all work together to provide you with the care you need, when you need it.  Your next appointment:   Keep your appointment tomorrow with Dr. Phillips Odor will see Donna Herrera back 02/27/24 2:45  Provider:   You may see Lanier Prude, MD or one of the following Advanced Practice Providers on your designated Care Team:   Francis Dowse, [place] Casimiro Needle "Mardelle Matte" Rarden, [place] Sherie Don, NP Canary Brim, NP    We recommend signing up for the patient portal called "MyChart".  Sign up information is provided on this After Visit Summary.  MyChart is used to connect with patients for Virtual Visits (Telemedicine).  Patients are able to view lab/test results, encounter notes, upcoming appointments, etc.  Non-urgent messages can be sent to your provider as well.   To learn more about what you can do with MyChart, go to [email or web address].   Other Instructions       1st Floor: - Lobby - Registration  - Pharmacy  - Lab -  Cafe  2nd Floor: - PV Lab - Diagnostic Testing (echo, CT, nuclear med)  3rd Floor: - Vacant  4th Floor: - TCTS (cardiothoracic surgery) - AFib Clinic - Structural Heart Clinic - Vascular Surgery  - Vascular Ultrasound  5th Floor: - HeartCare Cardiology (general and EP) - Clinical Pharmacy for coumadin, hypertension, lipid, weight-loss medications, and med management appointments    Valet parking services will be available as well.

## 2024-01-30 NOTE — Progress Notes (Unsigned)
 Electrophysiology Office Note:    Date:  01/31/2024   ID:  Donna Herrera, DOB 16-Feb-1946, MRN [id number]  [facility] Cardiologist:  Dietrich Pates, MD  [facility] Electrophysiologist:  Lanier Prude, MD   Referring MD: Penne Lash, MD   Chief Complaint: Atrial fibrillation  History of Present Illness:    Donna Herrera is a 78 year old woman who I am seeing today for an evaluation of atrial fibrillation at the request of Tereso Newcomer, PA-C.    I last saw the patient in February of this year when she was hospitalized for Tikosyn load.    The patient has a history of chronic systolic heart failure with an EF less than 20% on an echo in February of this year.  Her medical history also includes persistent atrial fibrillation on Tikosyn, hypertension, hyperlipidemia, tobacco use, interstitial lung disease and aortic atherosclerosis.  She is with family today in clinic.  She continues to be very symptomatic with fatigue.  Some nighttime shortness of breath.    Their past medical, social and family history was reviewed.   ROS:   Please see the history of present illness.    All other systems reviewed and are negative.  EKGs/Labs/Other Studies Reviewed:    The following studies were reviewed today:  January 30, 2024 EKG shows atrial flutter, PVC  January 22, 2024 EKG shows sinus rhythm, PAC with a right bundle aberrancy, QTc of 438 ms EKG Interpretation Date/Time:  Wednesday January 31 2024 15:42:02 EDT Ventricular Rate:  77 PR Interval:    QRS Duration:  114 QT Interval:  436 QTC Calculation: 493 R Axis:   -36  Text Interpretation: Atrial fibrillation Confirmed by Steffanie Dunn [phone]) on 01/31/2024 4:19:39 PM    Physical Exam:    VS:  BP 134/80   Pulse 77   Ht 5\' 6"  (1.676 m)   Wt 165 lb (74.8 kg)   SpO2 95%   BMI 26.63 kg/m     Wt Readings from Last 3 Encounters:  01/31/24 165 lb (74.8 kg)  01/30/24 165 lb 6.4 oz (75 kg)  01/22/24 162 lb  (73.5 kg)     GEN: no distress..  No distress laying at 30 degrees on exam table. CARD: Irregularly irregular, No MRG RESP: No IWOB. CTAB.        ASSESSMENT AND PLAN:    1. Persistent atrial fibrillation (HCC)   2. Heart failure with improved ejection fraction (HFimpEF) (HCC)   3. Hypertension, unspecified type   4. Other hyperlipidemia   5. Prediabetes   6. SOB (shortness of breath)     # Permanent atrial fibrillation #High risk med monitoring-dofetilide  The patient has atrial fibrillation associated with systolic heart failure.  She has been managed with Tikosyn although this has proven not completely effective.  She is on Eliquis for stroke prophylaxis.  I discussed options during today's clinic appointment.  For now, I would like her to stop the Tikosyn.  I suspect stopping the Tikosyn will help the headaches which may improve her overall symptomatology.  I discussed amiodarone and its potential toxicities including pulmonary toxicity and the patient is very clear that she is not interested in trying amiodarone.  We discussed pursuing CRT-P implant followed by AV junction ablation.  She would like to proceed with this strategy.  She understands that there is no guarantee that this will improve her symptoms.  We discussed the role of defibrillator therapy but given the patient's comorbidities and age, I do not think  she is a good candidate.  Would plan for CRT-P.  She will need to hold her Eliquis for 3 days prior to implant.  Risks, benefits, and alternatives to CRT-P were discussed in detail today.  The patient understands that risks include but are not limited to bleeding, infection, pneumothorax, perforation, tamponade, vascular damage, renal failure, MI, stroke, death, inappropriate shocks and lead dislodgement and wishes to proceed.  We will therefore schedule the procedure at the next available time.  Will wait 6 to 8 weeks before pursuing AV junction ablation after CRT-D  implant.  Risk, benefits, and alternatives to AV junction ablation were also discussed in detail today. These risks include but are not limited to stroke, bleeding, vascular damage, tamponade, perforation, damage to the heart and other structures, worsening renal function, and death. The patient understands these risk and wishes to proceed.     Signed, Rossie Muskrat. Lalla Brothers, MD, [facility], [facility] 01/31/2024 8:03 PM    Electrophysiology [facility]

## 2024-01-30 NOTE — Assessment & Plan Note (Signed)
 EF has improved in the past on GDMT. Last TTE in 01/2023 showed EF 50-55. While admitted in Jan 2025, TTE showed her EF had gone down again to < 20. This was likely related to tachycardia. Some of her meds were stopped due to to hypotension. She had a hard time tolerating ACE/ARB in the past but seems to be tolerating Losartan well. She was previously on Hydralazine but is off of that now. She was on Spironolactone but is also off of that now. Will need to continue to titrate GDMT and repeat a Limited TTE to recheck her EF at some point. Cath in 2017 showed no CAD. She has had some chest pain but this sounds like symptoms related to AFib. However, if EF remains down despite optimal GDMT and maintenance of NSR, will need to consider CCTA to rule out CAD. -Continue Farxiga 10 mg once daily -Continue Losartan 50 mg once daily  -Continue Furosemide 20 mg Mon, Wed, Fri -Continue K+ 10 mEq once daily  -BMET today. If K+ still low, add Spironolactone back -Start Metoprolol tartrate 25 mg twice daily >> eventually transition to Metoprolol succinate  -She could not tolerate Entresto in the hospital due to low BP. If BP remains elevated, consider changing ARB to [facility]

## 2024-01-30 NOTE — Assessment & Plan Note (Signed)
 BP borderline elevated. Continue Losartan 50 mg once daily. Add metoprolol tartrate 25 mg twice daily for rate control.

## 2024-01-31 ENCOUNTER — Encounter: Payer: Self-pay | Admitting: Cardiology

## 2024-01-31 ENCOUNTER — Telehealth: Payer: Self-pay

## 2024-01-31 ENCOUNTER — Ambulatory Visit: Attending: Cardiology | Admitting: Cardiology

## 2024-01-31 VITALS — BP 134/80 | HR 77 | Ht 66.0 in | Wt 165.0 lb

## 2024-01-31 DIAGNOSIS — I4819 Other persistent atrial fibrillation: Secondary | ICD-10-CM

## 2024-01-31 DIAGNOSIS — R7303 Prediabetes: Secondary | ICD-10-CM

## 2024-01-31 DIAGNOSIS — I5032 Chronic diastolic (congestive) heart failure: Secondary | ICD-10-CM

## 2024-01-31 DIAGNOSIS — R0602 Shortness of breath: Secondary | ICD-10-CM

## 2024-01-31 DIAGNOSIS — I10 Essential (primary) hypertension: Secondary | ICD-10-CM

## 2024-01-31 DIAGNOSIS — E7849 Other hyperlipidemia: Secondary | ICD-10-CM

## 2024-01-31 LAB — BASIC METABOLIC PANEL WITH GFR
BUN/Creatinine Ratio: 13 (ref 12–28)
BUN: 10 mg/dL (ref 8–27)
CO2: 24 mmol/L (ref 20–29)
Calcium: 9.6 mg/dL (ref 8.7–10.3)
Chloride: 102 mmol/L (ref 96–106)
Creatinine, Ser: 0.78 mg/dL (ref 0.57–1.00)
Glucose: 94 mg/dL (ref 70–99)
Potassium: 4.1 mmol/L (ref 3.5–5.2)
Sodium: 143 mmol/L (ref 134–144)
eGFR: 78 mL/min/{1.73_m2} (ref >59)

## 2024-01-31 NOTE — Patient Instructions (Addendum)
 Medication Instructions:  Your physician has recommended you make the following change in your medication:  1) STOP taking Tikosyn  *If you need a refill on your cardiac medications before your next appointment, please call your pharmacy*  Lab Work: BMET and CBC - please have these drawn at any [facility] location prior to your procedure  Testing/Procedures: Pacemaker Your physician has recommended that you have a pacemaker inserted. A pacemaker is a small device that is placed under the skin of your chest or abdomen to help control abnormal heart rhythms. This device uses electrical pulses to prompt the heart to beat at a normal rate. Pacemakers are used to treat heart rhythms that are too slow. Wire (leads) are attached to the pacemaker that goes into the chambers of you heart. This is done in the hospital and usually requires and overnight stay. Please see the instruction sheet given to you today for more information.  Ablation Your physician has recommended that you have an ablation. Catheter ablation is a medical procedure used to treat some cardiac arrhythmias (irregular heartbeats). During catheter ablation, a long, thin, flexible tube is put into a blood vessel in your groin (upper thigh), or neck. This tube is called an ablation catheter. It is then guided to your heart through the blood vessel. Radio frequency waves destroy small areas of heart tissue where abnormal heartbeats may cause an arrhythmia to start.  You are scheduled for an AV Node Ablation on Monday, June 2 with Dr. Steffanie Dunn.Please arrive at the Main Entrance A at [facility]: [address] [place], [place] [address] at 10:30 AM    Follow-Up: At [facility], you and your health needs are our priority.  As part of our continuing mission to provide you with exceptional heart care, our providers are all part of one team.  This team includes your primary Cardiologist (physician) and Advanced Practice  Providers or APPs (Physician Assistants and Nurse Practitioners) who all work together to provide you with the care you need, when you need it.  Your next appointment:   We will call you to arrange your post-procedure appointments       1st Floor: - Lobby - Registration  - Pharmacy  - Lab - Cafe  2nd Floor: - PV Lab - Diagnostic Testing (echo, CT, nuclear med)  3rd Floor: - Vacant  4th Floor: - TCTS (cardiothoracic surgery) - AFib Clinic - Structural Heart Clinic - Vascular Surgery  - Vascular Ultrasound  5th Floor: - HeartCare Cardiology (general and EP) - Clinical Pharmacy for coumadin, hypertension, lipid, weight-loss medications, and med management appointments    Valet parking services will be available as well.

## 2024-01-31 NOTE — Telephone Encounter (Signed)
 [facility] Nursing calls nurse line requesting verbal orders.   She requests verbal orders for social work evaluation.   Verbal order given.

## 2024-02-02 ENCOUNTER — Other Ambulatory Visit: Payer: Self-pay | Admitting: Family Medicine

## 2024-02-02 DIAGNOSIS — I501 Left ventricular failure: Secondary | ICD-10-CM

## 2024-02-04 ENCOUNTER — Other Ambulatory Visit: Payer: Self-pay | Admitting: Family Medicine

## 2024-02-05 ENCOUNTER — Other Ambulatory Visit: Payer: Self-pay

## 2024-02-05 DIAGNOSIS — I4891 Unspecified atrial fibrillation: Secondary | ICD-10-CM

## 2024-02-05 MED ORDER — APIXABAN 5 MG PO TABS: 5.0000 mg | ORAL_TABLET | Freq: Two times a day (BID) | ORAL | 0 refills | Status: DC

## 2024-02-05 NOTE — Progress Notes (Signed)
 Pt has been made aware of normal result and verbalized understanding.  jw

## 2024-02-08 ENCOUNTER — Other Ambulatory Visit: Payer: Self-pay | Admitting: Family Medicine

## 2024-02-08 ENCOUNTER — Ambulatory Visit: Admitting: Student

## 2024-02-08 DIAGNOSIS — M1A09X0 Idiopathic chronic gout, multiple sites, without tophus (tophi): Secondary | ICD-10-CM

## 2024-02-08 MED ORDER — COLCHICINE 0.6 MG PO TABS: 0.6000 mg | ORAL_TABLET | Freq: Every day | ORAL | 1 refills | Status: DC

## 2024-02-08 NOTE — Addendum Note (Signed)
 Addended byPenne Lash on: 02/08/2024 08:57 PM   Modules accepted: Orders

## 2024-02-09 ENCOUNTER — Other Ambulatory Visit: Payer: Self-pay

## 2024-02-09 ENCOUNTER — Observation Stay (HOSPITAL_COMMUNITY)

## 2024-02-09 ENCOUNTER — Inpatient Hospital Stay (HOSPITAL_COMMUNITY)
Admission: EM | Admit: 2024-02-09 | Discharge: 2024-02-12 | DRG: 308 | Disposition: A | Discharge status: Home / Self Care | Source: Ambulatory Visit | Attending: Family Medicine | Admitting: Family Medicine

## 2024-02-09 ENCOUNTER — Emergency Department (HOSPITAL_COMMUNITY)

## 2024-02-09 ENCOUNTER — Encounter (HOSPITAL_COMMUNITY): Payer: Self-pay

## 2024-02-09 DIAGNOSIS — N390 Urinary tract infection, site not specified: Secondary | ICD-10-CM | POA: Diagnosis not present

## 2024-02-09 DIAGNOSIS — E559 Vitamin D deficiency, unspecified: Secondary | ICD-10-CM | POA: Diagnosis present

## 2024-02-09 DIAGNOSIS — I4891 Unspecified atrial fibrillation: Secondary | ICD-10-CM | POA: Diagnosis present

## 2024-02-09 DIAGNOSIS — I509 Heart failure, unspecified: Secondary | ICD-10-CM

## 2024-02-09 DIAGNOSIS — Z789 Other specified health status: Secondary | ICD-10-CM

## 2024-02-09 DIAGNOSIS — R7989 Other specified abnormal findings of blood chemistry: Secondary | ICD-10-CM | POA: Diagnosis not present

## 2024-02-09 DIAGNOSIS — Z808 Family history of malignant neoplasm of other organs or systems: Secondary | ICD-10-CM

## 2024-02-09 DIAGNOSIS — R600 Localized edema: Secondary | ICD-10-CM

## 2024-02-09 DIAGNOSIS — G629 Polyneuropathy, unspecified: Secondary | ICD-10-CM | POA: Diagnosis present

## 2024-02-09 DIAGNOSIS — I110 Hypertensive heart disease with heart failure: Secondary | ICD-10-CM | POA: Diagnosis present

## 2024-02-09 DIAGNOSIS — Z7984 Long term (current) use of oral hypoglycemic drugs: Secondary | ICD-10-CM

## 2024-02-09 DIAGNOSIS — I4811 Longstanding persistent atrial fibrillation: Secondary | ICD-10-CM | POA: Diagnosis present

## 2024-02-09 DIAGNOSIS — Z823 Family history of stroke: Secondary | ICD-10-CM

## 2024-02-09 DIAGNOSIS — Z833 Family history of diabetes mellitus: Secondary | ICD-10-CM

## 2024-02-09 DIAGNOSIS — Z96641 Presence of right artificial hip joint: Secondary | ICD-10-CM | POA: Diagnosis present

## 2024-02-09 DIAGNOSIS — I255 Ischemic cardiomyopathy: Secondary | ICD-10-CM

## 2024-02-09 DIAGNOSIS — R102 Pelvic and perineal pain: Secondary | ICD-10-CM | POA: Diagnosis present

## 2024-02-09 DIAGNOSIS — I4821 Permanent atrial fibrillation: Principal | ICD-10-CM | POA: Diagnosis present

## 2024-02-09 DIAGNOSIS — I5023 Acute on chronic systolic (congestive) heart failure: Secondary | ICD-10-CM | POA: Diagnosis present

## 2024-02-09 DIAGNOSIS — I4819 Other persistent atrial fibrillation: Secondary | ICD-10-CM | POA: Diagnosis not present

## 2024-02-09 DIAGNOSIS — I10 Essential (primary) hypertension: Secondary | ICD-10-CM

## 2024-02-09 DIAGNOSIS — Z7901 Long term (current) use of anticoagulants: Secondary | ICD-10-CM

## 2024-02-09 DIAGNOSIS — Z91013 Allergy to seafood: Secondary | ICD-10-CM

## 2024-02-09 DIAGNOSIS — Z888 Allergy status to other drugs, medicaments and biological substances status: Secondary | ICD-10-CM

## 2024-02-09 DIAGNOSIS — G894 Chronic pain syndrome: Secondary | ICD-10-CM | POA: Diagnosis present

## 2024-02-09 DIAGNOSIS — I428 Other cardiomyopathies: Secondary | ICD-10-CM | POA: Diagnosis present

## 2024-02-09 DIAGNOSIS — M109 Gout, unspecified: Secondary | ICD-10-CM | POA: Diagnosis present

## 2024-02-09 DIAGNOSIS — I5020 Unspecified systolic (congestive) heart failure: Secondary | ICD-10-CM

## 2024-02-09 DIAGNOSIS — J8410 Pulmonary fibrosis, unspecified: Secondary | ICD-10-CM | POA: Diagnosis present

## 2024-02-09 DIAGNOSIS — M1A09X0 Idiopathic chronic gout, multiple sites, without tophus (tophi): Secondary | ICD-10-CM

## 2024-02-09 DIAGNOSIS — Q6589 Other specified congenital deformities of hip: Secondary | ICD-10-CM

## 2024-02-09 DIAGNOSIS — Z79899 Other long term (current) drug therapy: Secondary | ICD-10-CM

## 2024-02-09 DIAGNOSIS — Z818 Family history of other mental and behavioral disorders: Secondary | ICD-10-CM

## 2024-02-09 DIAGNOSIS — R310 Gross hematuria: Secondary | ICD-10-CM | POA: Diagnosis not present

## 2024-02-09 DIAGNOSIS — Z96653 Presence of artificial knee joint, bilateral: Secondary | ICD-10-CM | POA: Diagnosis present

## 2024-02-09 DIAGNOSIS — E876 Hypokalemia: Secondary | ICD-10-CM | POA: Diagnosis present

## 2024-02-09 DIAGNOSIS — E785 Hyperlipidemia, unspecified: Secondary | ICD-10-CM | POA: Diagnosis present

## 2024-02-09 DIAGNOSIS — R7303 Prediabetes: Secondary | ICD-10-CM | POA: Diagnosis present

## 2024-02-09 DIAGNOSIS — Z860100 Personal history of colon polyps, unspecified: Secondary | ICD-10-CM

## 2024-02-09 DIAGNOSIS — I472 Ventricular tachycardia, unspecified: Secondary | ICD-10-CM | POA: Diagnosis present

## 2024-02-09 DIAGNOSIS — I2489 Other forms of acute ischemic heart disease: Secondary | ICD-10-CM | POA: Diagnosis present

## 2024-02-09 DIAGNOSIS — N12 Tubulo-interstitial nephritis, not specified as acute or chronic: Secondary | ICD-10-CM | POA: Insufficient documentation

## 2024-02-09 DIAGNOSIS — K219 Gastro-esophageal reflux disease without esophagitis: Secondary | ICD-10-CM | POA: Diagnosis present

## 2024-02-09 DIAGNOSIS — Z885 Allergy status to narcotic agent status: Secondary | ICD-10-CM

## 2024-02-09 DIAGNOSIS — Z8249 Family history of ischemic heart disease and other diseases of the circulatory system: Secondary | ICD-10-CM

## 2024-02-09 DIAGNOSIS — I493 Ventricular premature depolarization: Secondary | ICD-10-CM | POA: Diagnosis present

## 2024-02-09 DIAGNOSIS — Z800 Family history of malignant neoplasm of digestive organs: Secondary | ICD-10-CM

## 2024-02-09 DIAGNOSIS — F32A Depression, unspecified: Secondary | ICD-10-CM | POA: Diagnosis present

## 2024-02-09 DIAGNOSIS — J439 Emphysema, unspecified: Secondary | ICD-10-CM | POA: Diagnosis present

## 2024-02-09 DIAGNOSIS — Z87891 Personal history of nicotine dependence: Secondary | ICD-10-CM

## 2024-02-09 DIAGNOSIS — D6869 Other thrombophilia: Secondary | ICD-10-CM | POA: Diagnosis present

## 2024-02-09 DIAGNOSIS — B9620 Unspecified Escherichia coli [E. coli] as the cause of diseases classified elsewhere: Secondary | ICD-10-CM | POA: Diagnosis present

## 2024-02-09 DIAGNOSIS — I501 Left ventricular failure: Secondary | ICD-10-CM

## 2024-02-09 HISTORY — DX: Other persistent atrial fibrillation: I48.19

## 2024-02-09 HISTORY — DX: Heart failure, unspecified: I50.9

## 2024-02-09 LAB — RESPIRATORY PANEL BY PCR

## 2024-02-09 LAB — URINALYSIS, ROUTINE W REFLEX MICROSCOPIC
Bilirubin Urine: NEGATIVE
Glucose, UA: NEGATIVE mg/dL
Ketones, ur: NEGATIVE mg/dL
Nitrite: POSITIVE — AB
Protein, ur: >=300 mg/dL — AB
RBC / HPF: >50 RBC/hpf (ref 0–5)
Specific Gravity, Urine: 1.008 (ref 1.005–1.030)
WBC, UA: >50 WBC/hpf (ref 0–5)
pH: 6 (ref 5.0–8.0)

## 2024-02-09 LAB — CBC
HCT: 41.2 % (ref 36.0–46.0)
Hemoglobin: 12.4 g/dL (ref 12.0–15.0)
MCH: 27.6 pg (ref 26.0–34.0)
MCHC: 30.1 g/dL (ref 30.0–36.0)
MCV: 91.6 fL (ref 80.0–100.0)
Platelets: 229 10*3/uL (ref 150–400)
RBC: 4.5 MIL/uL (ref 3.87–5.11)
RDW: 17.2 % — ABNORMAL HIGH (ref 11.5–15.5)
WBC: 6 10*3/uL (ref 4.0–10.5)
nRBC: 0 % (ref 0.0–0.2)

## 2024-02-09 LAB — BASIC METABOLIC PANEL WITH GFR
Anion gap: 16 — ABNORMAL HIGH (ref 5–15)
BUN: 13 mg/dL (ref 8–23)
CO2: 19 mmol/L — ABNORMAL LOW (ref 22–32)
Calcium: 8.4 mg/dL — ABNORMAL LOW (ref 8.9–10.3)
Chloride: 100 mmol/L (ref 98–111)
Creatinine, Ser: 0.71 mg/dL (ref 0.44–1.00)
GFR, Estimated: >60 mL/min (ref >60)
Glucose, Bld: 97 mg/dL (ref 70–99)
Potassium: 3.5 mmol/L (ref 3.5–5.1)
Sodium: 135 mmol/L (ref 135–145)

## 2024-02-09 LAB — BRAIN NATRIURETIC PEPTIDE: B Natriuretic Peptide: 1361.3 pg/mL — ABNORMAL HIGH (ref 0.0–100.0)

## 2024-02-09 LAB — TROPONIN I (HIGH SENSITIVITY)
Troponin I (High Sensitivity): 24 ng/L — ABNORMAL HIGH (ref <18)
Troponin I (High Sensitivity): 29 ng/L — ABNORMAL HIGH (ref <18)

## 2024-02-09 LAB — HEMOGLOBIN A1C
Hgb A1c MFr Bld: 5.6 % (ref 4.8–5.6)
Mean Plasma Glucose: 114.02 mg/dL

## 2024-02-09 LAB — MAGNESIUM: Magnesium: 1.8 mg/dL (ref 1.7–2.4)

## 2024-02-09 MED ORDER — OXYCODONE HCL 5 MG PO TABS
5.0000 mg | ORAL_TABLET | Freq: Four times a day (QID) | ORAL | Status: DC | PRN
  Administered 2024-02-09 – 2024-02-12 (×5): 5 mg via ORAL
  Filled 2024-02-09 (×5): qty 1

## 2024-02-09 MED ORDER — ALBUTEROL SULFATE HFA 108 (90 BASE) MCG/ACT IN AERS: 1.0000 | INHALATION_SPRAY | Freq: Four times a day (QID) | RESPIRATORY_TRACT | Status: DC | PRN

## 2024-02-09 MED ORDER — DULOXETINE HCL 30 MG PO CPEP
30.0000 mg | ORAL_CAPSULE | Freq: Every day | ORAL | Status: DC
  Administered 2024-02-09 – 2024-02-12 (×4): 30 mg via ORAL
  Filled 2024-02-09 (×4): qty 1

## 2024-02-09 MED ORDER — ACETAMINOPHEN 500 MG PO TABS
1000.0000 mg | ORAL_TABLET | Freq: Every day | ORAL | Status: DC | PRN
  Administered 2024-02-09: 1000 mg via ORAL
  Filled 2024-02-09: qty 2

## 2024-02-09 MED ORDER — SODIUM CHLORIDE 0.9 % IV SOLN
1.0000 g | Freq: Once | INTRAVENOUS | Status: AC
  Administered 2024-02-09: 1 g via INTRAVENOUS
  Filled 2024-02-09: qty 10

## 2024-02-09 MED ORDER — ALLOPURINOL 100 MG PO TABS
100.0000 mg | ORAL_TABLET | Freq: Every day | ORAL | Status: DC
  Administered 2024-02-10 – 2024-02-12 (×3): 100 mg via ORAL
  Filled 2024-02-09 (×3): qty 1

## 2024-02-09 MED ORDER — FUROSEMIDE 10 MG/ML IJ SOLN
40.0000 mg | Freq: Two times a day (BID) | INTRAMUSCULAR | Status: DC
  Administered 2024-02-09 – 2024-02-12 (×6): 40 mg via INTRAVENOUS
  Filled 2024-02-09 (×6): qty 4

## 2024-02-09 MED ORDER — POTASSIUM CHLORIDE 20 MEQ PO PACK
40.0000 meq | PACK | Freq: Once | ORAL | Status: AC
  Administered 2024-02-09: 40 meq via ORAL
  Filled 2024-02-09: qty 2

## 2024-02-09 MED ORDER — ALBUTEROL SULFATE (2.5 MG/3ML) 0.083% IN NEBU: 2.5000 mg | INHALATION_SOLUTION | Freq: Four times a day (QID) | RESPIRATORY_TRACT | Status: DC | PRN

## 2024-02-09 MED ORDER — METOPROLOL TARTRATE 25 MG PO TABS
50.0000 mg | ORAL_TABLET | Freq: Two times a day (BID) | ORAL | Status: DC
  Administered 2024-02-09 – 2024-02-12 (×6): 50 mg via ORAL
  Filled 2024-02-09 (×6): qty 2

## 2024-02-09 MED ORDER — FAMOTIDINE 20 MG PO TABS: 20.0000 mg | ORAL_TABLET | Freq: Every day | ORAL | Status: DC | PRN

## 2024-02-09 MED ORDER — FUROSEMIDE 10 MG/ML IJ SOLN
40.0000 mg | Freq: Once | INTRAMUSCULAR | Status: AC
  Administered 2024-02-09: 40 mg via INTRAVENOUS
  Filled 2024-02-09: qty 4

## 2024-02-09 MED ORDER — METOPROLOL TARTRATE 25 MG PO TABS: 25.0000 mg | ORAL_TABLET | Freq: Two times a day (BID) | ORAL | Status: DC

## 2024-02-09 MED ORDER — DAPAGLIFLOZIN PROPANEDIOL 10 MG PO TABS
10.0000 mg | ORAL_TABLET | Freq: Every day | ORAL | Status: DC
  Administered 2024-02-10 – 2024-02-12 (×3): 10 mg via ORAL
  Filled 2024-02-09 (×3): qty 1

## 2024-02-09 MED ORDER — GABAPENTIN 300 MG PO CAPS
300.0000 mg | ORAL_CAPSULE | Freq: Every day | ORAL | Status: DC
  Administered 2024-02-09 – 2024-02-11 (×3): 300 mg via ORAL
  Filled 2024-02-09 (×3): qty 1

## 2024-02-09 MED ORDER — EZETIMIBE 10 MG PO TABS
10.0000 mg | ORAL_TABLET | Freq: Every day | ORAL | Status: DC
  Administered 2024-02-10 – 2024-02-12 (×3): 10 mg via ORAL
  Filled 2024-02-09 (×3): qty 1

## 2024-02-09 MED ORDER — APIXABAN 5 MG PO TABS
5.0000 mg | ORAL_TABLET | Freq: Two times a day (BID) | ORAL | Status: DC
  Administered 2024-02-09 – 2024-02-12 (×6): 5 mg via ORAL
  Filled 2024-02-09 (×6): qty 1

## 2024-02-09 MED ORDER — LOSARTAN POTASSIUM 50 MG PO TABS
50.0000 mg | ORAL_TABLET | Freq: Every day | ORAL | Status: DC
  Administered 2024-02-10 – 2024-02-12 (×3): 50 mg via ORAL
  Filled 2024-02-09 (×3): qty 1

## 2024-02-09 NOTE — ED Notes (Signed)
 Pt has been given lasix, has limited mobility and is voiding too often to try to do bedpan or get to toilet at this time. Purewick placed

## 2024-02-09 NOTE — ED Notes (Signed)
 Dinner tray arrived

## 2024-02-09 NOTE — H&P (Addendum)
 Hospital Admission History and Physical Service Pager: [phone]  Patient name: Donna Herrera Medical record number: [id number] Date of Birth: 1946/01/10 Age: 78 y.o. Gender: female  Primary Care Provider: Penne Lash, MD Consultants: Cardiology in the ED Code Status: Full Preferred Emergency Contact:  Contact Information     Name Relation Home Work [place] Granddaughter [phone]  [phone]   Cherlyn Labella Daughter   [phone]      Other Contacts   None on File    Chief Complaint: Afib w/ RVR  Assessment and Plan: Donna Herrera is a 78 y.o. female presenting with chest pain, shortness of breath and swelling. Differential for this patient's presentation of this includes:  A-fib with RVR, most likely: Previous diagnoses, palpitations consistent with previous episodes, A-fib on EKG, possibly induced by UTI noted in UA and CHFe below.  Appears to have worsened since discontinuation of Tikosyn. CHF exacerbation, more likely: Due to recent LVEF 20-25%, BNP elevated to 1361, increased bilateral lower extremity edema, mild crackles in the bilateral lung bases.  Could be contributing to A-fib with RVR. Unstable angina, less likely: Would not explain palpitations, described as soreness and then sharp or crushing pain.  Reassuring EKG, initial troponin.  No previous history of MI. Pneumonia/COPDe, less likely: Afebrile, notes incidental cough with white sputum, however no wheezing, O2 requirement, focal consolidation noted on chest x-ray, no subjective fever. GERD, less likely: Patient has previous diagnoses of GERD, however history and EKG findings support ongoing A-fib.  Unrelated to diet.  Adherent to famotidine. Assessment & Plan Atrial fibrillation with RVR (HCC) HR to 150s in ED, now rate controlled on our exam.  Diagnosed January 2025.  On home Entresto.  Previously on Tikosyn, discontinued 4/2 due to headaches.  It is the family's opinion  that palpitations, shortness of breath and bilateral lower extremity swelling worsened after discontinuation of this medicine.  Patient is scheduled for CRT-D implant later this month followed by AV Junction ablation in June. - Admit to [facility] Medicine Teaching Service, Dr. Lum Babe attending - Cardiology consulted in ED, appreciate cards recs - Continue home Eliquis 5 mg twice daily and home metoprolol 25 mg twice daily - AM CBCs, BMPs, Mg, TSH - Await second troponin, first one elevated though reassuring, likely in setting of demand ischemia - Med/tele bed, monitor rates and rhythm - Daily weights instructor as well as strict I/Os Acute exacerbation of CHF (congestive heart failure) (HCC) EF 20 to 25% as of 12/02/2023, repeat unlikely to have utility. Takes Lasix 3 times weekly at home with supplemental potassium.  Some suspicion for acute exacerbation-noted increased bilateral lower extremity swelling yesterday and minimal crackles in bilateral lung bases with shortness of breath observed on exam. BNP 1361. CXR did not indicate pulmonary edema but appears to have mild vascular congestion on my read. - Give IV Lasix 40 mg x1 and redose as appropriate -- Appreciate cardiology input as above - Continue home Farxiga 10 mg daily, losartan 50 mg daily, Toprol 25 mg BID - Hold home furosemide 20 mg 3 times weekly as well as potassium supplementation while giving IV - Daily weights and strict I/Os as above - PT/OT consult, appreciate recs - Awaiting RVP panel, lipid panel, A1c UTI (urinary tract infection) UA positive for bacteria, leukocytes, nitrites, hemoglobin and protein greater than 300.  Symptomatic: Dysuria began yesterday, patient affirms foul-smelling urine and gross hematuria. Also notes period of an unusual feeling of urinary retention yesterday, although she has been able to  pass urine here without issue.   - S/p IV ceftriaxone x1 in the ED to treat UTI, we will narrow as able and likely  transition to PO tomorrow - Await UCx results, sensitivities - AM BMPs, CBCs - Can continue Eliquis at this time despite frank blood in urine as hematuria is almost never hemodynamically significant -- If hematuria persists, would consult urology likely outpatient given smoking history Hypokalemia Low normal at 3.5 in the setting of diuretic use. - Ordered potassium 40 mEq x1 Chronic health problem HTN: Losartan 50 mg as above. Hip dysplasia: Continue home crutches.  PT/OT. Gout: Continue allopurinol 100 mg daily Chronic pain: Continue acetaminophen for mild/moderate pain, and home as needed oxycodone for severe pain Depression: Continue Cymbalta Vitamin D deficiency: Hold weekly vitamin D HLD: Continue ezetimibe, updating lipid panel as above GERD: Continue famotidine Neuropathy: Continue gabapentin 300 mg nightly Prediabetes: Continue to monitor A1c as above  FEN/GI: Heart healthy  VTE Prophylaxis: Home Eliquis  Disposition: Med-tele  History of Present Illness:  Donna Herrera is a 78 y.o. female presenting with shortness of breath, dizziness, and urinary discomfort.  Last week, she started having SOB, cough, and increased white sputum. She went to a doctor at that time, and her oxygen was a little low. Last night, she starting having dysuria. She noticed blood in the urine. She also felt it was harder for her to urinate at that time, but she has been able to urinate much better since being here in ED.  This morning, she was dizzy and lightheaded. She also had some SOB and palpitations. However, she had an appointment with her pain doctor today, and she wanted to still go. At that appt, she had sweating, nausea, and vomiting along with the above symptoms.  She also has had increased fluid buildup in her face and legs since yesterday. Hard to tell if fevering. Stooling normally.  She had dizziness with movements into the bed and out of bed. No history of falls or  stroke.  She has orthopnea at baseline. No CPAP use.  She sometimes has cramping in the chest but this did not occur with symptoms today.  She notes that she was recently off Tikosyn per her cardiologist due to it not working as it should, and she wants to know if this caused her presentation. She has plans to get a pacemaker on 21st.  In the ED, she was found to be in afib with RVR.  Review Of Systems: Per HPI.  Pertinent Past Medical History: A-fib with RVR Tobacco use disorder (50 pack years) Interstitial lung disease Hypertension Gout HFrEF (EF: 20%) SCFE GERD HLD not on statin due to intolerance Remainder reviewed in history tab.   Pertinent Past Surgical History: Cardiac cath Bilateral knee replacement Right hip replacement Rotator cuff repair Remainder reviewed in history tab.   Pertinent Social History: Tobacco use: 50-year smoking history, quit 2017 Alcohol use: No Other Substance use: No Lives with her nieces  Pertinent Family History: Mother: Depression, T2DM, HTN, Stroke, Heart disease Father: Cancer Sister: T2DM Brother: Stomach cancer Remainder reviewed in history tab.   Important Outpatient Medications: Tylenol 500 mg as needed daily Albuterol 1 to 2 puffs every 6 hours as needed Allopurinol 100 mg daily Cymbalta 30 mg daily Zetia 10 mg daily Famotidine 20 mg daily Ferrous sulfate 325 every other day Lasix 20 mg Monday Wednesday Friday Gabapentin 300 mg nightly Lopressor 25 mg twice daily Multivitamin Narcan as needed Oxycodone 5 mg every 6 hours  as needed Potassium 10 mEq daily Eliquis 5 mg twice daily Colchicine 0.6 mg daily Farxiga 10 mg daily Losartan 50 mg daily Remainder reviewed in medication history.   Objective: BP (!) 131/92   Pulse 97   Temp 98.4 F (36.9 C)   Resp (!) 23   Ht 5\' 6"  (1.676 m)   Wt 73 kg   SpO2 98%   BMI 25.99 kg/m  Exam: General: Well-appearing older woman, laying in Emergency Department bed, alert  and oriented, conversant, pleasant Eyes: No scleral icterus noted, extraocular movements grossly intact Cardiovascular: Irregular rhythm, normal rate, no murmurs rubs or gallops Respiratory: Minimal crackles in bilateral lung bases, otherwise no expiratory wheezes, rhonchi Gastrointestinal: Bowel sounds present, no tenderness in 4 quadrants MSK: Strength grossly intact, moves all extremities equally, bilateral lower extremities with trace to 1+ pitting edema to the level of the knees Neuro: Grossly intact, gait at baseline with crutches Psych: Appropriate mood and affect  Labs:  CBC BMET  Recent Labs  Lab 02/09/24 1115  WBC 6.0  HGB 12.4  HCT 41.2  PLT 229   Recent Labs  Lab 02/09/24 1515  NA 135  K 3.5  CL 100  CO2 19*  BUN 13  CREATININE 0.71  GLUCOSE 97  CALCIUM 8.4*    Pertinent additional labs   UA: Large hemoglobin, large leukocytes, many bacteria, positive nitrite, protein greater than 300 K: 3.5 CR: 0.71 Troponins: 24 ? awaiting Hemoglobin 12.4, stable  EKG: A-fib, regular rate, no STEMI  Imaging Studies Performed:  XR chest (4/11): Impression: Cardiomegaly.  No pneumonia or pulmonary edema.  Tomie China, MD 02/09/2024, 5:31 PM PGY-1, [facility]  FPTS Intern pager: [phone], text pages welcome Secure chat group [facility] Teaching Service    I agree with the assessment and plan as documented above.  Janeal Holmes, MD PGY-2, [facility]

## 2024-02-09 NOTE — Assessment & Plan Note (Addendum)
 Low normal at 3.5 in the setting of diuretic use. - Ordered potassium 40 mEq x1

## 2024-02-09 NOTE — Assessment & Plan Note (Addendum)
 UA positive for bacteria, leukocytes, nitrites, hemoglobin and protein greater than 300.  Symptomatic: Dysuria began yesterday, patient affirms foul-smelling urine and gross hematuria. Also notes period of an unusual feeling of urinary retention yesterday, although she has been able to pass urine here without issue.   - S/p IV ceftriaxone x1 in the ED to treat UTI, we will narrow as able and likely transition to PO tomorrow - Await UCx results, sensitivities - AM BMPs, CBCs - Can continue Eliquis at this time despite frank blood in urine as hematuria is almost never hemodynamically significant -- If hematuria persists, would consult urology likely outpatient given smoking history

## 2024-02-09 NOTE — Assessment & Plan Note (Addendum)
 HTN: Losartan 50 mg as above. Hip dysplasia: Continue home crutches.  PT/OT. Gout: Continue allopurinol 100 mg daily Chronic pain: Continue acetaminophen for mild/moderate pain, and home as needed oxycodone for severe pain Depression: Continue Cymbalta Vitamin D deficiency: Hold weekly vitamin D HLD: Continue ezetimibe, updating lipid panel as above GERD: Continue famotidine Neuropathy: Continue gabapentin 300 mg nightly Prediabetes: Continue to monitor A1c as above

## 2024-02-09 NOTE — Assessment & Plan Note (Addendum)
 EF 20 to 25% as of 12/02/2023, repeat unlikely to have utility. Takes Lasix 3 times weekly at home with supplemental potassium.  Some suspicion for acute exacerbation-noted increased bilateral lower extremity swelling yesterday and minimal crackles in bilateral lung bases with shortness of breath observed on exam. BNP 1361. CXR did not indicate pulmonary edema but appears to have mild vascular congestion on my read. - Give IV Lasix 40 mg x1 and redose as appropriate -- Appreciate cardiology input as above - Continue home Farxiga 10 mg daily, losartan 50 mg daily, Toprol 25 mg BID - Hold home furosemide 20 mg 3 times weekly as well as potassium supplementation while giving IV - Daily weights and strict I/Os as above - PT/OT consult, appreciate recs - Awaiting RVP panel, lipid panel, A1c

## 2024-02-09 NOTE — ED Provider Notes (Signed)
 Patient is currently admitted to family medicine service Dr. Kenhinde Eniola for UTI, mildly elevated troponin. Rocephin provided for UTI.   Currently pending cardiology consult for mildly elevated troponin and recent A-fib RVR.  She sees Dr. Evelina Hippo cardiology at [facility] heart care Cardiac hx includes atrial fibrillation (on dofetilide and Eliquis), HFrEF (<20%), HTN, HLD  Presented to emergency department for evaluation of lightheadedness, shortness of breath while at PCP office for routine check up.  EKG at their office noted A-fib with RVR.  Prior to arrival to ER, patient has spontaneously converted.  She is never had any chest pain at all.  EKG in ED shows a fib at 69bpm. She does have a mildly elevated troponin at 24 (baseline has been 7-17 over the past year) however this may be due to demand ischemia secondary to recent A-fib.  5:01 PM Spoke to cardiology Dr. Dinah Franco, discussed ED workup and patient presentation. He agrees to consult on patient during admission   Royann Cords, [place] 02/09/24 1702    Scarlette Currier, MD 02/10/24 1114

## 2024-02-09 NOTE — ED Notes (Signed)
 Patient denies pain and is resting comfortably.

## 2024-02-09 NOTE — Assessment & Plan Note (Addendum)
 HR to 150s in ED, now rate controlled on our exam.  Diagnosed January 2025.  On home Entresto.  Previously on Tikosyn, discontinued 4/2 due to headaches.  It is the family's opinion that palpitations, shortness of breath and bilateral lower extremity swelling worsened after discontinuation of this medicine.  Patient is scheduled for CRT-D implant later this month followed by AV Junction ablation in June. - Admit to [facility] Medicine Teaching Service, Dr. Lum Babe attending - Cardiology consulted in ED, appreciate cards recs - Continue home Eliquis 5 mg twice daily and home metoprolol 25 mg twice daily - AM CBCs, BMPs, Mg, TSH - Await second troponin, first one elevated though reassuring, likely in setting of demand ischemia - Med/tele bed, monitor rates and rhythm - Daily weights instructor as well as strict I/Os

## 2024-02-09 NOTE — Assessment & Plan Note (Signed)
>>  ASSESSMENT AND PLAN FOR ACUTE EXACERBATION OF CHF (CONGESTIVE HEART FAILURE) (HCC) WRITTEN ON 02/09/2024  5:48 PM BY MABE, GERALD, MD  EF 20 to 25% as of 12/02/2023, repeat unlikely to have utility. Takes Lasix  3 times weekly at home with supplemental potassium.  Some suspicion for acute exacerbation-noted increased bilateral lower extremity swelling yesterday and minimal crackles in bilateral lung bases with shortness of breath observed on exam. BNP 1361. CXR did not indicate pulmonary edema but appears to have mild vascular congestion on my read. - Give IV Lasix  40 mg x1 and redose as appropriate -- Appreciate cardiology input as above - Continue home Farxiga  10 mg daily, losartan  50 mg daily, Toprol  25 mg BID - Hold home furosemide  20 mg 3 times weekly as well as potassium supplementation while giving IV - Daily weights and strict I/Os as above - PT/OT consult, appreciate recs - Awaiting RVP panel, lipid panel, A1c

## 2024-02-09 NOTE — Hospital Course (Addendum)
 Donna Herrera is a 78 y.o.female with a history of A-fib with RVR, ILD, HFrEF (EF: 20 to 25%) GERD, HLD, and gout who was admitted to the family medicine teaching Service at [facility] for A-fib with RVR. Her hospital course is detailed below:  Atrial fibrillation with RVR Heart rate to 150s in ED, converted to A-fib without RVR spontaneously. Admitted to med tele.   Continued home Eliquis 5 mg.  Restarted home medications.  Cardiology consulted, recommended continuing Eliquis, increasing metoprolol to 50 mg twice daily.  Patient is scheduled for a CRT pacemaker and an AVJ ablation next week, cards did not recommend doing these procedures earlier.  On discharge, patient was rate controlled. Of note, patient was initially not started on beta blocker due to concern of bradycardia. Patients HR remained within normal limits during this admission.  Acute exacerbation of CHF EF 20 to 25% in February -- BNP 1361.  Appeared volume up in bilateral lower extremities, however minimal pulmonary hypertension involvement.  Started on IV Lasix 40 mg twice daily during admission.  Required occasional potassium supplementation throughout diuresis course and was discharged on supplemental potassium. Cardiology saw patient prior to discharge and agreed to discharging patient on oral lasix dosing. Patient was taking lasix 20 mg MWF prior to admission, which was adjusted to 40mg  daily per cardiology recs at discharge and 10mEq potassium chloride.   UTI UA on admit positive for bacteria, leukocytes, nitrite, hemoglobin (frank hematuria) as well as protein.  Creatinine at baseline.  Urine culture showed E. Coli.  Started ceftriaxone on admission and received three day course. Patient without symptoms at discharge.    Other chronic conditions were medically managed with home medications and formulary alternatives as necessary (hypertension, hip dysplasia, gout, chronic pain, depression, vitamin D deficiency, hyperlipidemia,  GERD, neuropathy, prediabetes)  PCP Follow-up Recommendations: Will need close cards follow-up BMP to track potassium  Repeat UA for hematuria, consider referral to urology  Consider stopping farxiga given she had UTI. Discuss with patient

## 2024-02-09 NOTE — Consult Note (Addendum)
 Cardiology Consult    Patient ID: Donna Herrera MRN: [id number], DOB/AGE: [birth year]   Admit date: 02/09/2024 Date of Consult: 02/09/2024  Primary Physician: Penne Lash, MD Primary Cardiologist: Dietrich Pates, MD Requesting Provider: Gevena Barre, MD  Patient Profile    Donna Herrera is a 78 y.o. female with a history of NICM, HFrEF, persistent Afib, HTN, HL, tob abuse, ILD, Ao atherosclerosis, nl cors on cath 2017, who is being seen today for the evaluation of Afib rvr at the request of Dr. Lum Babe.  Past Medical History  Subjective  Past Medical History:  Diagnosis Date   Acute exacerbation of CHF (congestive heart failure) (HCC) 02/09/2024   Adenomatous polyp 03/23/2020   Colonoscopy 2021 notable for adenomatous polyp. Per GI, no further follow up colonoscopies recommended given patient's age.   Allergy    Arthritis    Atherosclerosis of aorta (HCC) 07/15/2021   Candidiasis, mouth 10/19/2017   Chronic combined systolic and diastolic CHF (congestive heart failure) (HCC) 10/21/2015   a. 12/2014 Echo: EF 40%; b. 10/2015 Echo: EF 25-40%; c. 02/2018 Echo: EF 15-20%; d. 01/2023 Echo: EF 50-55%; e. 12/2023 Echo: EF 20-25%, glob HK, RVSP , mod BAE, triv MR, mod TR.   Chronic left shoulder pain 04/10/2013   Chronic lung disease    Chronic pain    Chronic pain syndrome 07/20/2017   Confusion 06/08/2021   Depression 08/25/2016   DJD (degenerative joint disease) 10/12/2012   Multiple joint replacements by Dr. Leslee Home.    Dyshidrotic eczema 12/31/2014   Edentulous 07/19/2021   Emphysema lung (HCC) 03/02/2021   Long smoking history.  Noted on CT Low Dose Chest: 01/2021: IMPRESSION: Chronic interstitial changes raise the question of pulmonary fibrosis. High-resolution chest CT may prove helpful to further evaluate as clinically warranted.Emphysema (ICD10-J43.9) and Aortic Atherosclerosis (ICD10-170.0)  Recommended pulmonology referral but patient declined. Recommend  continued discussion and symptom monitor   Essential hypertension 07/20/2017   GERD (gastroesophageal reflux disease)    Gout    H/O slipped capital femoral epiphysis (SCFE)    Heart failure, left, with LVEF 41-49% (HCC) 10/21/2015   transthoracic echocardiogram 06/06/18: EF appears improved to 40-45% with diffuse hypokinesis  A. Echo 3/16: EF 40%, diffuse HK, mild MR, moderate LAE, mild RAE, PASP 42 mmHg  //  B. Echo 12/16: Mild LVH, EF 35-40%, diffuse HK, grade 2 diastolic dysfunction, MAC, mild MR, moderate LAE, normal RVSF, PASP 49 mmHg   History of total right hip replacement 04/02/2019   Followed closely with orthopedist Dr. Charlann Boxer. Patient is s/p total right hip replacement in 1980's with evidence of bone loss and osteopenia making reconstruction unlikely. Plan for conservative treatment at this time with crutches for ambulation and pain control.   Hyperlipidemia    Hypertension    Hypertensive heart disease with CHF (congestive heart failure) (HCC) 10/12/2012   Loss of taste 02/06/2018   Mass of breast, left 07/21/2014   NICM (nonischemic cardiomyopathy) (HCC) 06/09/2016   a. 04/2016 Cath: Normal coronary arteries, EF 30-35%, LVEDP 14 mmHg; b. 12/2014 Echo: EF 40%; c. 10/2015 Echo: EF 25-40%; d. 02/2018 Echo: EF 15-20%; e. 01/2023 Echo: EF 50-55%; f. 12/2023 Echo: EF 20-25%, glob HK, RVSP , mod BAE, triv MR, mod TR.   Pain in gums 04/18/2018   Persistent atrial fibrillation (HCC)    a. 12/2023 s/p TEE/DCCV - 200J-->tikosyn loaded; b. 01/2024 ERAF->tikosyn d/c'd.   Pre-diabetes    Pulmonary fibrosis (HCC) 03/02/2021   CT Low Dose Chest: 01/2021: IMPRESSION: Chronic interstitial changes  raise the question of pulmonary fibrosis. High-resolution chest CT may prove helpful to further evaluate as clinically warranted.Emphysema (ICD10-J43.9) and Aortic Atherosclerosis (ICD10-170.0)  Recommended pulmonology referral but patient declined. Recommend continued discussion and symptom monitoring.    Pulmonary nodule 1 cm or greater in diameter 03/02/2021   CT low dose Chest: 01/2021: 1. Lung-RADS 4A, suspicious. 10 mm posterior left lower lobe pulmonary nodule, possibly scarring. Follow up low-dose chest CT without contrast in 3 months (please use the following order, "CT CHEST LCS NODULE FOLLOW-UP W/O CM") is recommended. Alternatively, PET may be considered when there is a solid component 8mm or larger.  Plan: Follow up low dose CT ordered in 3 mon   Rib pain on left side 09/17/2020   Right hip pain 11/02/2012   Tendonitis, Achilles, left 05/24/2017   Tobacco abuse 09/29/2014   Urge incontinence 07/19/2021   Vitamin D deficiency 12/31/2014    Past Surgical History:  Procedure Laterality Date   CARDIAC CATHETERIZATION  2005   normal-Hochrein   CARDIAC CATHETERIZATION N/A 05/11/2016   Procedure: Right/Left Heart Cath and Coronary Angiography;  Surgeon: Peter M Swaziland, MD;  Location: [facility] INVASIVE CV LAB;  Service: Cardiovascular;  Laterality: N/A;   CARDIOVERSION N/A 12/04/2023   Procedure: CARDIOVERSION;  Surgeon: Sande Rives, MD;  Location: [facility] INVASIVE CV LAB;  Service: Cardiovascular;  Laterality: N/A;   CESAREAN SECTION     COLONOSCOPY  1990's ??   HIP FUSION Left    left    JOINT REPLACEMENT Bilateral 1991, 1997   [place]   ROTATOR CUFF REPAIR     bilateral   TOOTH EXTRACTION N/A 04/03/2020   Procedure: DENTAL RESTORATION/EXTRACTIONS;  Surgeon: Ocie Doyne, DDS;  Location: [facility] OR;  Service: Oral Surgery;  Laterality: N/A;   TOTAL HIP ARTHROPLASTY  1987   right. Dr. Leslee Home   TRANSESOPHAGEAL ECHOCARDIOGRAM (CATH LAB) N/A 12/04/2023   Procedure: TRANSESOPHAGEAL ECHOCARDIOGRAM;  Surgeon: Sande Rives, MD;  Location: [facility] INVASIVE CV LAB;  Service: Cardiovascular;  Laterality: N/A;   TUBAL LIGATION       Allergies  Allergies  Allergen Reactions   Crab [Shellfish Allergy]     Itching to lips   Morphine And Codeine Itching and Swelling   Atorvastatin Other  (See Comments)    achiness   Rosuvastatin Other (See Comments)    Muscle pain and achiness      History of Present Illness   78 y/o ? with a history of NICM, HFrEF, persistent Afib, HTN, HL, tob abuse, ILD, Ao atherosclerosis.  She was dx w/ NICM in the setting of dyspnea and 2017, at which time her EF was 35 to 40%.  She underwent diagnostic catheterization, which showed normal coronary arteries.  She was placed on GDMT.  Follow-up echo in May 2019 showed worsening of EF to 15-20% with slow improvement over the years, and echo in April 2024 showing low normal EF of 50-55%.  In February 2025, she was admitted to [facility] with a 2-week history of dyspnea and volume overload.  She was found to be in atrial fibrillation with rates in the 80s to 120s.  She was admitted and underwent echo which showed an EF of 20 to 25% with global hypokinesis and RVSP of 40.  She was placed on Eliquis and subsequently underwent TEE and cardioversion (200 J).  Post cardioversion, she had early return of paroxysmal atrial fibrillation and was placed on Tikosyn therapy the following morning.  She tolerated Tikosyn loading and was discharged  home in sinus rhythm.  Unfortunately, at office follow-up in April 1, she was noted to be in rapid atrial fibrillation which converted to sinus rhythm spontaneously.  She was seen back in clinic by Dr. Lalla Brothers April 2 anticus and was discontinued with plan for future placement of CRT-P and AV nodal ablation, scheduled for February 19, 2024.  Unfortunate, over the past week, Donna Herrera has noted progressive dyspnea, orthopnea, 10 pound weight gain, increased abdominal girth, and over the past 24 hours, increased lower extremity edema.  Due to progressive symptoms, she called EMS today was found to be in A-fib with RVR.  She was transported to the ED.  Here, she was afebrile at 98.2 with a blood pressure of 134/85.  She was in relatively rate controlled A-fib on arrival.  Follow-up  twelve-lead ECG shows atrial fibrillation at 69 bpm with left axis deviation, PVC, nonspecific T changes.  Labs notable for mild troponin elevation to 24 with a BNP of 1361.3.  H&H, renal function, and electrolytes are normal.  Chest x-ray shows cardiomegaly without pneumonia or pulmonary edema.  Urinalysis with large leukocytes and positive nitrite,, and many bacteria.  She was treated with ceftriaxone, 40 mg IV Lasix, and potassium chloride.  She has noted good response to Lasix thus far with some improvement in breathing though she still is not able to lie flat.  Inpatient Medications  Subjective    [START ON 02/10/2024] allopurinol  100 mg Oral Daily   apixaban  5 mg Oral BID   [START ON 02/10/2024] dapagliflozin propanediol  10 mg Oral Daily   DULoxetine  30 mg Oral Daily   [START ON 02/10/2024] ezetimibe  10 mg Oral Daily   furosemide  40 mg Intravenous BID   gabapentin  300 mg Oral QHS   [START ON 02/10/2024] losartan  50 mg Oral Daily   metoprolol tartrate  50 mg Oral BID    Family History    Family History  Problem Relation Age of Onset   Depression Mother    Diabetes Mother    Hypertension Mother    Stroke Mother    Heart disease Mother    Cancer Father        unknown primary   Diabetes Sister    Stomach cancer Brother    Throat cancer Son    Breast cancer Neg Hx    Esophageal cancer Neg Hx    Colon cancer Neg Hx    Colon polyps Neg Hx    She indicated that her mother is deceased. She indicated that her father is deceased. She indicated that the status of her sister is unknown. She indicated that the status of her brother is unknown. She indicated that her maternal grandmother is deceased. She indicated that her maternal grandfather is deceased. She indicated that her paternal grandmother is deceased. She indicated that her paternal grandfather is deceased. She indicated that the status of her son is unknown. She indicated that the status of her neg hx is unknown.   Social  History    Social History   Socioeconomic History   Marital status: Widowed    Spouse name: Not on file   Number of children: 3   Years of education: Not on file   Highest education level: High school graduate  Occupational History   Occupation: retired  Tobacco Use   Smoking status: Former    Current packs/day: 0.00    Average packs/day: 0.5 packs/day for 55.0 years (27.5 ttl pk-yrs)  Types: Cigarettes    Start date: 45    Quit date: 2017    Years since quitting: 8.2    Passive exposure: Past   Smokeless tobacco: Never   Tobacco comments:    Former smoker 01/22/24  Vaping Use   Vaping status: Never Used  Substance and Sexual Activity   Alcohol use: Not Currently   Drug use: No   Sexual activity: Not on file  Other Topics Concern   Not on file  Social History Narrative   Formerly daycare provider. Lives with husband, daughter Lajoyce Corners and grandkids.   Social Drivers of Corporate investment banker Strain: Low Risk  (12/05/2023)   Overall Financial Resource Strain (CARDIA)    Difficulty of Paying Living Expenses: Not hard at all  Food Insecurity: No Food Insecurity (02/09/2024)   Hunger Vital Sign    Worried About Running Out of Food in the Last Year: Never true    Ran Out of Food in the Last Year: Never true  Transportation Needs: No Transportation Needs (02/09/2024)   PRAPARE - Administrator, Civil Service (Medical): No    Lack of Transportation (Non-Medical): No  Physical Activity: Insufficiently Active (04/28/2023)   Exercise Vital Sign    Days of Exercise per Week: 3 days    Minutes of Exercise per Session: 30 min  Stress: No Stress Concern Present (04/28/2023)   [employer] of Occupational Health - Occupational Stress Questionnaire    Feeling of Stress : Only a little  Social Connections: Moderately Isolated (02/09/2024)   Social Connection and Isolation Panel [NHANES]    Frequency of Communication with Friends and Family: More than three times a  week    Frequency of Social Gatherings with Friends and Family: Three times a week    Attends Religious Services: More than 4 times per year    Active Member of Clubs or Organizations: No    Attends Banker Meetings: Never    Marital Status: Widowed  Intimate Partner Violence: Not At Risk (02/09/2024)   Humiliation, Afraid, Rape, and Kick questionnaire    Fear of Current or Ex-Partner: No    Emotionally Abused: No    Physically Abused: No    Sexually Abused: No     Review of Systems    General:  No chills, fever, night sweats or weight changes.  Cardiovascular:  No chest pain, +++ dyspnea on exertion, +++ bilat LE edema, +++ orthopnea, +++ occas palpitations, +++ paroxysmal nocturnal dyspnea. Dermatological: No rash, lesions/masses Respiratory: No cough, +++ dyspnea Urologic: No hematuria, dysuria Abdominal:   +++ inc abd girth.  No nausea, vomiting, diarrhea, bright red blood per rectum, melena, or hematemesis Neurologic:  No visual changes, wkns, changes in mental status. All other systems reviewed and are otherwise negative except as noted above.    Objective  Physical Exam    Blood pressure (!) 137/91, pulse 100, temperature 98.9 F (37.2 C), temperature source Oral, resp. rate 20, height 5\' 6"  (1.676 m), weight 76.6 kg, SpO2 98%.  General: Pleasant, NAD Psych: Normal affect. Neuro: Alert and oriented X 3. Moves all extremities spontaneously. HEENT: Normal  Neck: Supple without bruits.  JVD to jaw. Lungs:  Resp regular and unlabored, bibasilar crackles. Heart: Irregularly irregular, no s3, s4, or murmurs. Abdomen: Semifirm, nontender, BS + x 4.  Extremities: No clubbing, cyanosis.  1+ bilateral lower extremity edema. DP/PT2+, Radials 2+ and equal bilaterally.  Labs    Cardiac Enzymes Recent Labs  Lab  02/09/24 1115  TROPONINIHS 24*     BNP    Component Value Date/Time   BNP 1,361.3 (H) 02/09/2024 1545   BNP 355.0 (H) 01/14/2015 1217    ProBNP     Component Value Date/Time   PROBNP 371 06/27/2023 0931    Lab Results  Component Value Date   WBC 6.0 02/09/2024   HGB 12.4 02/09/2024   HCT 41.2 02/09/2024   MCV 91.6 02/09/2024   PLT 229 02/09/2024    Recent Labs  Lab 02/09/24 1515  NA 135  K 3.5  CL 100  CO2 19*  BUN 13  CREATININE 0.71  CALCIUM 8.4*  GLUCOSE 97   Lab Results  Component Value Date   CHOL 220 (H) 11/03/2023   HDL 68 11/03/2023   LDLCALC 138 (H) 11/03/2023   TRIG 78 11/03/2023   Lab Results  Component Value Date   DDIMER 2.72 (H) 10/04/2015      Radiology Studies    DG Chest Port 1 View Result Date: 02/09/2024 CLINICAL DATA:  Dyspnea EXAM: PORTABLE CHEST 1 VIEW COMPARISON:  December 01, 2023 FINDINGS: Elevation of the right hemidiaphragm, unchanged. No focal airspace consolidation, pleural effusion, or pneumothorax. Unchanged cardiomegaly. Tortuous aorta with aortic atherosclerosis. No acute fracture or destructive lesions. Multilevel thoracic osteophytosis. Degenerative changes of the shoulders. IMPRESSION: Cardiomegaly.  No pneumonia or pulmonary edema. Electronically Signed   By: Wallie Char M.D.   On: 02/09/2024 12:07      ECG & Cardiac Imaging    Afib, 69, LAD, PVC, non-specific T changes - personally reviewed.  Assessment & Plan    1.  Acute on chronic heart failure with reduced ejection fraction/nonischemic cardiomyopathy: History of nonischemic cardiomyopathy with variable EF dating back to 2017.  Normal coronary arteries at that time.  Most recently, admitted in February 2025 with new diagnosis of atrial fibrillation and reduced EF to 15-20% by echo.  She initially underwent TEE and cardioversion with early return of A-fib and subsequently was placed on Tikosyn which was unsuccessful in maintaining sinus rhythm in the outpatient setting and therefore, she is now off of Tikosyn since April 2.  She has likely remained in atrial fibrillation since then and over the past week has had  progressive dyspnea, orthopnea, PND, increasing abdominal girth, 10 pound weight gain, and lower extremity edema prompting EMS evaluation and transport to the ED today.  BNP 1361.3 with mild troponin elevation to 24.  Chest x-ray without edema.  She is markedly volume overloaded on examination and has responded well so far to 1 dose of IV Lasix.  Will redosed with Lasix tonight and place her on 40 mg IV twice daily.  Titrating metoprolol to 50 mg twice daily.  Continue ARB.  Consider spironolactone and/or SGLT2 inhibitor.  2.  Persistent Afib:  Dx in 12/2023.  S/p TEE/DCCV w/ ERAF  Tikosyn loaded during admission but noted to have recurrent afib @ office f/u on 4/1.  Saw Dr. Lalla Brothers 4/2 and Joice Lofts d/c'd.  Plan for CRT-P later this month followed by AVN ablation.  Unfortunately, patient has had persistent atrial fibrillation resulting in worsening heart failure.  Rates are variable though generally well-controlled.  Increasing metoprolol to 50 mg twice daily.  Continue Eliquis.  Will ask electrophysiology to round on this weekend to see if CRT-P/AV nodal ablation can be moved up.  3.  Primary hypertension: Stable on beta-blocker and ARB.  4.  Demand ischemia: Mild elevation of high-sensitivity troponin to 24 in the setting of #  1.  History of normal coronary arteries.  No chest pain.  Known LV dysfunction.  No plan for ischemic evaluation at this time.  Follow.  5.  NSVT:  intermittent runs of NSVT on tele while in the ED.  Asymptomatic.  Titrating ? blocker.  Risk Assessment/Risk Scores:        New York Heart Association (NYHA) Functional Class NYHA Class IV  CHA2DS2-VASc Score = 5   This indicates a 7.2% annual risk of stroke. The patient's score is based upon: CHF History: 1 HTN History: 1 Diabetes History: 0 Stroke History: 0 Vascular Disease History: 0 Age Score: 2 Gender Score: 1     Signed, Nicolasa Ducking, NP 02/09/2024, 6:28 PM  Patient seen and examined. Agree with  assessment and plan.  Donna Herrera is a 78 year old African-American female with known nonischemic cardiomyopathy, HFr EF, hypertension, hyperlipidemia, interstitial lung disease, and previously noted to have normal coronary arteries at catheterization in 2017.  She has a history of atrial fibrillation which presently is persistent.  She had been hospitalized earlier this year in February 2025 with a 2-week history of dyspnea and volume overload and was found to be in A-fib with rates in the range of 80-120.  Remotely, an echo in 2019 showed EF 15 to 20% and this had slowly improved to 50 to 55% in April 2024.  She underwent successful TEE cardioversion after being placed on Eliquis.  Subsequently she developed return of atrial fibrillation and was started on Tikosyn therapy.  Unfortunately despite Tikosyn, she again has reverted to atrial fibrillation.  She is followed by Dr. Lalla Brothers and due to recurrent A-fib Tikosyn was discontinued.  Plan is for future CRT-P and AV node ablation which tentatively has been scheduled for February 19, 2024.  She presents today after experiencing progressive lower extremity edema, increased shortness of breath, PND and orthopnea and has a 10 pound weight gain.  In the emergency room she was in A-fib with RVR.  Heart rate has been variable since presentation.  On exam she is in no acute distress.  Blood pressure was 135/85, pulse was irregular irregular in the 90s respirations were 16-20.  HEENT is unremarkable.  She did not have any carotid bruits.  There was mild increased JVD.  She had decreased breath sounds at her bases.  Rhythm was irregularly irregular with ventricular rate in the 90s consistent with atrial fibrillation.  Abdomen was nontender.  She does have some right hip discomfort.  She does have 2+ lower extremity edema on the left and 1+ on the right.  Neurologic exam was grossly nonfocal.  Agree with workup as above.  Plan to initiate IV diuresis this  evening.  Recheck electrolytes and BNP in AM.  Mild elevation of troponin at 24 most likely due to demand ischemia in the setting of A-fib.  Will plan to increase metoprolol for improved rate control.  Plan for follow-up echo Doppler study.  Have EP see patient this weekend with possible plan for earlier date of previously scheduled CRT-P/AV nodal ablation.   Lennette Bihari, MD, [facility] 02/09/2024 6:44 PM   For questions or updates, please contact   Please consult [email or web address] for contact info under Cardiology/STEMI.

## 2024-02-09 NOTE — Assessment & Plan Note (Deleted)
 EF 20 to 25% as of 12/02/2023.  Some suspicion for acute exacerbation-noted increased bilateral lower extremity swelling yesterday, however minimal crackles in bilateral lung bases indicate shortness of breath likely secondary to A-fib. BNP 1361. 1 CXR did not indicate pulmonary edema.  - Give IV Lasix 40 mg x1 - Continue home Farxiga 10 mg daily - Continue home losartan 50 mg daily - Continue home Toprol 25 mg - Hold home furosemide 20 mg Monday, Wednesday, Friday, - Hold potassium 10 mEq daily - Daily weights and strict I/Os as above

## 2024-02-09 NOTE — Plan of Care (Signed)
 FMTS Brief Progress Note  S:Patient reports she is feeling better after lasix. Denies any SOB or palpitations currently.    O: BP 118/86 (BP Location: Left Arm)   Pulse 61   Temp 98.6 F (37 C) (Oral)   Resp 18   Ht 5\' 6"  (1.676 m)   Wt 76.6 kg   SpO2 99%   BMI 27.26 kg/m   General: A&O, NAD Cardiac: irregularly irregular, no m/r/g Respiratory: very minimal crackles in bilateral lung bases  GI: Soft, NTTP, non-distended  Extremities: bilateral LE w trace [pitting edema  A/P: 78 yo F with PMHx of A fib, CHF, HTN, chronic pain admitted in setting of A fib with RVR, also with concern for heart failure excerbation and UTI. Patient currently asymptomatic. Tele shows rate controlled A fib. No changes to day teams plan.  - Orders reviewed. Labs for AM ordered, which was adjusted as needed.  - rest of plan per day team  Penne Lash, MD 02/09/2024, 9:55 PM PGY-1, [facility] Night Resident  Please page [phone] with questions.

## 2024-02-09 NOTE — ED Provider Notes (Addendum)
 Donna Herrera EMERGENCY DEPARTMENT AT [facility] Provider Note   CSN: [id number] Arrival date & time: 02/09/24  1055     History  Chief Complaint  Patient presents with   Shortness of Breath    Pt coming from doctors office for follow up. Pt c/o SOB since last night, n/v. Pt is Afib RVR at rate 130's. EMS report couplets of PVCs. Pt is 100% on RA. Denies CP.     Donna Herrera is a 78 y.o. female.  The history is provided by the patient, the EMS personnel and medical records. No language interpreter was used.  Shortness of Breath    78 year old female history of CHF with EF less than 20% on echo in February of this year, history of persistent A-fib on Tikosyn, hypertension, hyperlipidemia, tobacco use, interstitial lung disease and aortic atherosclerosis presenting to the ER via EMS from doctor's office with concerns of shortness of breath.  Patient states she was at her pain doctor office today for a regular follow-up.  She did mention that she has had some urinary discomfort with burning urination for the past few days..  She was attempt to give a urine sample in the bathroom when she became lightheadedness and dizziness with some shortness of breath.  She was noted to be in A-fib with RVR and patient was brought to the ER for further evaluation.  At this time patient states she is feeling a bit better.  She denies any active chest pain no fever chills no abdominal pain.  She did report nausea and vomiting earlier but that has since resolved.  Home Medications Prior to Admission medications   Medication Sig Start Date End Date Taking? Authorizing Provider  acetaminophen (TYLENOL) 500 MG tablet Take 1,000 mg by mouth daily as needed for mild pain or moderate pain.    [provider]  albuterol (VENTOLIN HFA) 108 (90 Base) MCG/ACT inhaler Inhale 1-2 puffs into the lungs every 6 (six) hours as needed for wheezing or shortness of breath. 05/16/23   Baloch, Mahnoor,  MD  allopurinol (ZYLOPRIM) 100 MG tablet Take 1 tablet (100 mg total) by mouth daily. 08/01/23   Baloch, Mahnoor, MD  apixaban (ELIQUIS) 5 MG TABS tablet Take 1 tablet (5 mg total) by mouth 2 (two) times daily. 02/05/24   Baloch, Mahnoor, MD  colchicine 0.6 MG tablet Take 1 tablet (0.6 mg total) by mouth daily. 02/08/24   Baloch, Mahnoor, MD  DULoxetine (CYMBALTA) 30 MG capsule TAKE 1 CAPSULE(30 MG) BY MOUTH DAILY 12/14/23   Baloch, Mahnoor, MD  ezetimibe (ZETIA) 10 MG tablet Take 1 tablet (10 mg total) by mouth daily. 01/04/24   Baloch, Mahnoor, MD  famotidine (PEPCID) 20 MG tablet TAKE 1 TABLET (20 MG TOTAL) BY MOUTH DAILY AS NEEDED FOR HEARTBURN OR INDIGESTION. 03/07/22   Paige, Victoria J, DO  FARXIGA 10 MG TABS tablet TAKE 1 TABLET BY MOUTH EVERY DAY 02/05/24   Baloch, Mahnoor, MD  ferrous sulfate 325 (65 FE) MG tablet Take 1 tablet (325 mg total) by mouth every other day. Patient taking differently: Take 325 mg by mouth once a week. 12/09/23   Elberta Fortis, MD  furosemide (LASIX) 20 MG tablet Take MWF 01/04/24   Baloch, Mahnoor, MD  gabapentin (NEURONTIN) 300 MG capsule Take 1 capsule (300 mg total) by mouth at bedtime as needed. TAKE 1 CAP BY MOUTH EVERYDAY AT BEDTIME. 08/01/23   Baloch, Mahnoor, MD  losartan (COZAAR) 50 MG tablet TAKE 1 TABLET BY MOUTH EVERY  DAY 02/02/24   Baloch, Mahnoor, MD  Menthol, Topical Analgesic, (ICY HOT BACK EX) Apply 1 Application topically 2 (two) times daily as needed (lower leg pain, knee pain).    [provider]  metoprolol tartrate (LOPRESSOR) 25 MG tablet Take 1 tablet (25 mg total) by mouth 2 (two) times daily. 01/30/24 04/29/24  Tereso Newcomer T, PA-C  Multiple Vitamin (MULTIVITAMIN WITH MINERALS) TABS tablet Take 1 tablet by mouth every morning.    [provider]  naloxone [facility]) nasal spray 4 mg/0.1 mL SMARTSIG:Both Nares 04/03/23   [provider]  oxyCODONE (OXY IR/ROXICODONE) 5 MG immediate release tablet Take 5 mg by mouth every 6 (six)  hours as needed for moderate pain or severe pain. 04/03/23   [provider]  potassium chloride (KLOR-CON) 10 MEQ tablet Take 1 tablet (10 mEq total) by mouth daily. 01/25/24 04/24/24  Fenton, Clint R, PA  Vitamin D, Ergocalciferol, (DRISDOL) 1.25 MG (50000 UNIT) CAPS capsule Take 50,000 Units by mouth once a week. Take on Wednesday 08/01/23   [provider]      Allergies    Parke Simmers allergy], Morphine and codeine, Atorvastatin, and Rosuvastatin    Review of Systems   Review of Systems  Respiratory:  Positive for shortness of breath.   All other systems reviewed and are negative.   Physical Exam Updated Vital Signs BP 134/85 (BP Location: Left Arm)   Pulse 97   Temp 98.2 F (36.8 C) (Oral)   Resp (!) 26   Ht 5\' 6"  (1.676 m)   Wt 73 kg   SpO2 100%   BMI 25.99 kg/m  Physical Exam Vitals and nursing note reviewed.  Constitutional:      General: She is not in acute distress.    Appearance: She is well-developed.  HENT:     Head: Atraumatic.  Eyes:     Conjunctiva/sclera: Conjunctivae normal.  Cardiovascular:     Rate and Rhythm: Rhythm irregular.     Pulses: Normal pulses.     Heart sounds: Normal heart sounds.  Pulmonary:     Effort: Pulmonary effort is normal.  Abdominal:     Palpations: Abdomen is soft.  Musculoskeletal:     Cervical back: Normal range of motion and neck supple.     Right lower leg: No edema.     Left lower leg: No edema.  Skin:    Findings: No rash.  Neurological:     Mental Status: She is alert. Mental status is at baseline.  Psychiatric:        Mood and Affect: Mood normal.     ED Results / Procedures / Treatments   Labs (all labs ordered are listed, but only abnormal results are displayed) Labs Reviewed  CBC - Abnormal; Notable for the following components:      Result Value   RDW 17.2 (*)    All other components within normal limits  URINALYSIS, ROUTINE W REFLEX MICROSCOPIC - Abnormal; Notable for the  following components:   APPearance CLOUDY (*)    Hgb urine dipstick LARGE (*)    Protein, ur >=300 (*)    Nitrite POSITIVE (*)    Leukocytes,Ua LARGE (*)    Bacteria, UA MANY (*)    All other components within normal limits  TROPONIN I (HIGH SENSITIVITY) - Abnormal; Notable for the following components:   Troponin I (High Sensitivity) 24 (*)    All other components within normal limits  URINE CULTURE  RESPIRATORY PANEL BY PCR  MAGNESIUM  BASIC METABOLIC PANEL WITH GFR  BRAIN NATRIURETIC PEPTIDE  TROPONIN I (HIGH SENSITIVITY)    EKG EKG Interpretation Date/Time:  Friday February 09 2024 11:01:27 EDT Ventricular Rate:  69 PR Interval:    QRS Duration:  104 QT Interval:  588 QTC Calculation: 631 R Axis:   -46  Text Interpretation: Atrial fibrillation Ventricular premature complex LAD, consider left anterior fascicular block Nonspecific T abnrm, anterolateral leads Prolonged QT interval Confirmed by Estanislado Pandy [phone]) on 02/09/2024 3:10:35 PM 02/09/2024 ED ECG REPORT   Date: 02/09/2024  Rate: 69  Rhythm: atrial fibrillation  QRS Axis: left  Intervals: QT prolonged  ST/T Wave abnormalities: nonspecific T wave changes  Conduction Disutrbances:left anterior fascicular block  Narrative Interpretation:   Old EKG Reviewed: unchanged  I have personally reviewed the EKG tracing and agree with the computerized printout as noted.   Radiology DG Chest Port 1 View Result Date: 02/09/2024 CLINICAL DATA:  Dyspnea EXAM: PORTABLE CHEST 1 VIEW COMPARISON:  December 01, 2023 FINDINGS: Elevation of the right hemidiaphragm, unchanged. No focal airspace consolidation, pleural effusion, or pneumothorax. Unchanged cardiomegaly. Tortuous aorta with aortic atherosclerosis. No acute fracture or destructive lesions. Multilevel thoracic osteophytosis. Degenerative changes of the shoulders. IMPRESSION: Cardiomegaly.  No pneumonia or pulmonary edema. Electronically Signed   By: Wallie Char M.D.   On:  02/09/2024 12:07    Procedures Procedures    Medications Ordered in ED Medications  cefTRIAXone (ROCEPHIN) 1 g in sodium chloride 0.9 % 100 mL IVPB (has no administration in time range)    ED Course/ Medical Decision Making/ A&P                                 Medical Decision Making Amount and/or Complexity of Data Reviewed Labs: ordered. Radiology: ordered.  Risk Decision regarding hospitalization.   BP 134/85 (BP Location: Left Arm)   Pulse 97   Temp 98.2 F (36.8 C) (Oral)   Resp (!) 26   Ht 5\' 6"  (1.676 m)   Wt 73 kg   SpO2 100%   BMI 25.99 kg/m   38:19 AM  78 year old female history of CHF with EF less than 20% on echo in February of this year, history of persistent A-fib on Tikosyn, hypertension, hyperlipidemia, tobacco use, interstitial lung disease and aortic atherosclerosis presenting to the ER via EMS from doctor's office with concerns of shortness of breath.  Patient states she was at her pain doctor office today for a regular follow-up.  She did mention that she has had some urinary discomfort with burning urination for the past few days..  She was attempt to give a urine sample in the bathroom when she became lightheadedness and dizziness with some shortness of breath.  She was noted to be in A-fib with RVR and patient was brought to the ER for further evaluation.  At this time patient states she is feeling a bit better.  She denies any active chest pain no fever chills no abdominal pain.  She did report nausea and vomiting earlier but that has since resolved.  On exam patient is resting comfortably appears to be in no acute discomfort.  Heart with irregularly irregular heart rhythm.  Lungs otherwise clear, abdomen soft nontender no peripheral edema noted.  -Labs ordered, independently viewed and interpreted by me.  Labs remarkable for initial troponin is mildly elevated at 24.  Awaits delta troponin.  Patient without active chest pain. -The  patient was  maintained on a cardiac monitor.  I personally viewed and interpreted the cardiac monitored which showed an underlying rhythm of: Atrial fibrillation, irregularly irregular heart rhythm -Imaging independently viewed and interpreted by me and I agree with radiologist's interpretation.  Result remarkable for chest x-ray shows cardiomegaly without focal infiltrate or pleural effusion -This patient presents to the ED for concern of shortness of breath, this involves an extensive number of treatment options, and is a complaint that carries with it a high risk of complications and morbidity.  The differential diagnosis includes A-fib with RVR, pneumonia, anemia, electrolyte imbalance, MI, stroke, PE, COPD, ILD -Co morbidities that complicate the patient evaluation includes ILD, A-fib, tobacco use, hyperlipidemia -Treatment includes rocephin -Reevaluation of the patient after these medicines showed that the patient improved -PCP office notes or outside notes reviewed -Discussion with specialist Family Medicine resident who agrees to admit pt and request card consult.  I have also request cardiology involvement in this pt care due to afib with rvr and mildly elevated trop.  -Escalation to admission/observation considered: patient is agreeable with admission.     Suspect slight bump in troponin is likely demand ischemia from her A-fib with RVR earlier today.  She does not have any active chest pain.  Will check a troponin.  Cardiology will be notified.    Final Clinical Impression(s) / ED Diagnoses Final diagnoses:  Acute lower UTI  Elevated troponin  Longstanding persistent atrial fibrillation [facility])    Rx / DC Orders ED Discharge Orders     None         Fayrene Helper, PA-C 02/09/24 1519    Fayrene Helper, PA-C 02/09/24 1520    Coral Spikes, DO 02/09/24 1521

## 2024-02-10 DIAGNOSIS — R7303 Prediabetes: Secondary | ICD-10-CM | POA: Diagnosis present

## 2024-02-10 DIAGNOSIS — M109 Gout, unspecified: Secondary | ICD-10-CM | POA: Diagnosis present

## 2024-02-10 DIAGNOSIS — R102 Pelvic and perineal pain: Secondary | ICD-10-CM | POA: Diagnosis present

## 2024-02-10 DIAGNOSIS — I110 Hypertensive heart disease with heart failure: Secondary | ICD-10-CM | POA: Diagnosis present

## 2024-02-10 DIAGNOSIS — I501 Left ventricular failure: Secondary | ICD-10-CM | POA: Diagnosis not present

## 2024-02-10 DIAGNOSIS — E785 Hyperlipidemia, unspecified: Secondary | ICD-10-CM | POA: Diagnosis present

## 2024-02-10 DIAGNOSIS — I5023 Acute on chronic systolic (congestive) heart failure: Secondary | ICD-10-CM | POA: Diagnosis present

## 2024-02-10 DIAGNOSIS — J439 Emphysema, unspecified: Secondary | ICD-10-CM | POA: Diagnosis present

## 2024-02-10 DIAGNOSIS — Z7901 Long term (current) use of anticoagulants: Secondary | ICD-10-CM | POA: Diagnosis not present

## 2024-02-10 DIAGNOSIS — I4821 Permanent atrial fibrillation: Secondary | ICD-10-CM | POA: Diagnosis present

## 2024-02-10 DIAGNOSIS — K219 Gastro-esophageal reflux disease without esophagitis: Secondary | ICD-10-CM | POA: Diagnosis present

## 2024-02-10 DIAGNOSIS — I509 Heart failure, unspecified: Secondary | ICD-10-CM | POA: Diagnosis not present

## 2024-02-10 DIAGNOSIS — Z87891 Personal history of nicotine dependence: Secondary | ICD-10-CM | POA: Diagnosis not present

## 2024-02-10 DIAGNOSIS — I2489 Other forms of acute ischemic heart disease: Secondary | ICD-10-CM | POA: Diagnosis present

## 2024-02-10 DIAGNOSIS — I428 Other cardiomyopathies: Secondary | ICD-10-CM | POA: Diagnosis present

## 2024-02-10 DIAGNOSIS — I4891 Unspecified atrial fibrillation: Secondary | ICD-10-CM | POA: Diagnosis not present

## 2024-02-10 DIAGNOSIS — I4811 Longstanding persistent atrial fibrillation: Secondary | ICD-10-CM | POA: Diagnosis not present

## 2024-02-10 DIAGNOSIS — E876 Hypokalemia: Secondary | ICD-10-CM | POA: Diagnosis present

## 2024-02-10 DIAGNOSIS — I493 Ventricular premature depolarization: Secondary | ICD-10-CM | POA: Diagnosis present

## 2024-02-10 DIAGNOSIS — I472 Ventricular tachycardia, unspecified: Secondary | ICD-10-CM | POA: Diagnosis present

## 2024-02-10 DIAGNOSIS — B9620 Unspecified Escherichia coli [E. coli] as the cause of diseases classified elsewhere: Secondary | ICD-10-CM | POA: Diagnosis present

## 2024-02-10 DIAGNOSIS — F32A Depression, unspecified: Secondary | ICD-10-CM | POA: Diagnosis present

## 2024-02-10 DIAGNOSIS — G629 Polyneuropathy, unspecified: Secondary | ICD-10-CM | POA: Diagnosis present

## 2024-02-10 DIAGNOSIS — J8410 Pulmonary fibrosis, unspecified: Secondary | ICD-10-CM | POA: Diagnosis present

## 2024-02-10 DIAGNOSIS — G894 Chronic pain syndrome: Secondary | ICD-10-CM | POA: Diagnosis present

## 2024-02-10 DIAGNOSIS — Z79899 Other long term (current) drug therapy: Secondary | ICD-10-CM | POA: Diagnosis not present

## 2024-02-10 DIAGNOSIS — N390 Urinary tract infection, site not specified: Secondary | ICD-10-CM | POA: Diagnosis present

## 2024-02-10 DIAGNOSIS — D6869 Other thrombophilia: Secondary | ICD-10-CM | POA: Diagnosis present

## 2024-02-10 DIAGNOSIS — R7989 Other specified abnormal findings of blood chemistry: Secondary | ICD-10-CM | POA: Diagnosis present

## 2024-02-10 LAB — CBC
HCT: 37 % (ref 36.0–46.0)
Hemoglobin: 11.7 g/dL — ABNORMAL LOW (ref 12.0–15.0)
MCH: 27.7 pg (ref 26.0–34.0)
MCHC: 31.6 g/dL (ref 30.0–36.0)
MCV: 87.5 fL (ref 80.0–100.0)
Platelets: 230 10*3/uL (ref 150–400)
RBC: 4.23 MIL/uL (ref 3.87–5.11)
RDW: 16.9 % — ABNORMAL HIGH (ref 11.5–15.5)
WBC: 5.2 10*3/uL (ref 4.0–10.5)
nRBC: 0 % (ref 0.0–0.2)

## 2024-02-10 LAB — BASIC METABOLIC PANEL WITH GFR
Anion gap: 14 (ref 5–15)
Anion gap: 11 (ref 5–15)
BUN: 17 mg/dL (ref 8–23)
BUN: 18 mg/dL (ref 8–23)
CO2: 23 mmol/L (ref 22–32)
CO2: 24 mmol/L (ref 22–32)
Calcium: 8.9 mg/dL (ref 8.9–10.3)
Calcium: 8.7 mg/dL — ABNORMAL LOW (ref 8.9–10.3)
Chloride: 100 mmol/L (ref 98–111)
Chloride: 99 mmol/L (ref 98–111)
Creatinine, Ser: 1.04 mg/dL — ABNORMAL HIGH (ref 0.44–1.00)
Creatinine, Ser: 0.89 mg/dL (ref 0.44–1.00)
GFR, Estimated: 55 mL/min — ABNORMAL LOW (ref >60)
GFR, Estimated: >60 mL/min (ref >60)
Glucose, Bld: 96 mg/dL (ref 70–99)
Glucose, Bld: 132 mg/dL — ABNORMAL HIGH (ref 70–99)
Potassium: 3.1 mmol/L — ABNORMAL LOW (ref 3.5–5.1)
Potassium: 5 mmol/L (ref 3.5–5.1)
Sodium: 137 mmol/L (ref 135–145)
Sodium: 134 mmol/L — ABNORMAL LOW (ref 135–145)

## 2024-02-10 LAB — LIPID PANEL
Cholesterol: 146 mg/dL (ref 0–200)
HDL: 54 mg/dL (ref >40)
LDL Cholesterol: 78 mg/dL (ref 0–99)
Total CHOL/HDL Ratio: 2.7 ratio
Triglycerides: 70 mg/dL (ref <150)
VLDL: 14 mg/dL (ref 0–40)

## 2024-02-10 LAB — MAGNESIUM
Magnesium: 1.7 mg/dL (ref 1.7–2.4)
Magnesium: 2.2 mg/dL (ref 1.7–2.4)

## 2024-02-10 LAB — GLUCOSE, CAPILLARY: Glucose-Capillary: 96 mg/dL (ref 70–99)

## 2024-02-10 LAB — TSH: TSH: 1.725 u[IU]/mL (ref 0.350–4.500)

## 2024-02-10 MED ORDER — MAGNESIUM SULFATE 2 GM/50ML IV SOLN: 2.0000 g | Freq: Once | INTRAVENOUS | Status: DC

## 2024-02-10 MED ORDER — SODIUM CHLORIDE 0.9 % IV SOLN
1.0000 g | INTRAVENOUS | Status: DC
  Administered 2024-02-10 – 2024-02-11 (×2): 1 g via INTRAVENOUS
  Filled 2024-02-10 (×2): qty 10

## 2024-02-10 MED ORDER — MAGNESIUM SULFATE 2 GM/50ML IV SOLN
2.0000 g | Freq: Once | INTRAVENOUS | Status: AC
  Administered 2024-02-10: 2 g via INTRAVENOUS
  Filled 2024-02-10: qty 50

## 2024-02-10 MED ORDER — TIZANIDINE HCL 4 MG PO TABS
2.0000 mg | ORAL_TABLET | Freq: Three times a day (TID) | ORAL | Status: DC | PRN
  Administered 2024-02-11: 2 mg via ORAL
  Filled 2024-02-10 (×3): qty 1

## 2024-02-10 MED ORDER — POTASSIUM CHLORIDE 20 MEQ PO PACK
40.0000 meq | PACK | ORAL | Status: AC
  Administered 2024-02-10 (×2): 40 meq via ORAL
  Filled 2024-02-10 (×2): qty 2

## 2024-02-10 NOTE — Assessment & Plan Note (Addendum)
 Now in rate controlled A-fib.  Per cardiology planning for EP evaluation to possibly move up ablation given patient failed outpatient Tikosyn therapy.  TSH normal.  K and mag repleted. - Cardiology recs, appreciate care - Continue home Eliquis 5 mg twice daily and home metoprolol 25 mg twice daily - Trend BMP and mag, replete to goal K >4 and Mag >2

## 2024-02-10 NOTE — Progress Notes (Signed)
 OT Cancellation Note - OT Screen and Discharge  Patient Details Name: Donna Herrera MRN: [id number] DOB: Jun 04, 1946   Cancelled Treatment:    Reason Eval/Treat Not Completed: OT screened, no needs identified, will sign off (OT screen completed with pt and family and discussed with RN and PT. Pt currently at baseline, completing ADLs Ind to Min assist and functional mobility Mod I with crutches. No further benefit from acute OT services and no equipment needs. OT signing off at this time.)  Kala Gassmann "Jenine Mix" M., OTR/L, MA Acute Rehab [phone]  Walt Gunner 02/10/2024, 5:50 PM

## 2024-02-10 NOTE — Evaluation (Signed)
 Physical Therapy Brief Evaluation and Discharge Note Patient Details Name: Donna Herrera MRN: [id number] DOB: 02/07/1946 Today's Date: 02/10/2024   History of Present Illness  Donna Herrera is a 78 y.o. female presented 02/09/24 from her pain clinic as EKG showed Afib with RVR. Pt c/o chest pain, shortness of breath, and swelling. Now in rate controlled A-fib.  Per cardiology planning for EP evaluation to possibly move up ablation given patient failed outpatient Tikosyn therapy. PMH includes A-fib on anticoagulation, HFrEF (EF 20%), tobacco use disorder, interstitial lung disease, Epmhysema, Pulmonary Fibrosis, HTN, gout, GERD and HLD.   Clinical Impression  Pt greeted supine in bed, pleasant and agreeable to PT evaluation. PTA, she was modI using crutches for functional mobility. She required intermittent assistance to lift her legs back into bed. She modI for the majority of ADLs, requires assistance with bathing and LB dressing. She is modI for most IADLs, but relies on family for transportation. She lives with her daughter and son-in-law in a one level house with a ramped entrance. Pt completed supine>sit with modI, sit>supine with minA at BLE, and OOB mobility using crutches with modI. She appears to be at her baseline function, no further PT needs. Patient feels ready and safe for discharge. I have answered all her questions related to mobility.     PT Assessment Patient does not need any further PT services  Assistance Needed at Discharge  Intermittent Supervision/Assistance    Equipment Recommendations None recommended by PT (Pt already has DME)  Recommendations for Other Services       Precautions/Restrictions Precautions Precautions: Fall Recall of Precautions/Restrictions: Intact Restrictions Weight Bearing Restrictions Per Provider Order: No        Mobility  Bed Mobility   Supine/Sidelying to sit: Modified independent (Device/Increased time) Sit to  supine/sidelying: Min assist General bed mobility comments: Pt took increased time to get out on the R side of bed adjusting bed features prn. Pt required assistance to bring BLE back into bed. Repositioned higher up in bed by use of bed pad +2 assist.  Transfers Overall transfer level: Modified independent Equipment used: Crutches               General transfer comment: Pt stood from an elevated bed height with use of crutches. Good eccentric control with sitting. She declined to transfer to recliner chair reporting it is too low for her.    Ambulation/Gait Ambulation/Gait assistance: Modified independent (Device/Increase time) Gait Distance (Feet): 150 Feet Assistive device: Crutches Gait Pattern/deviations: WFL(Within Functional Limits), Step-through pattern, Decreased step length - right, Step-to pattern Gait Speed: Pace WFL General Gait Details: Pt ambulates alternating between a reciprocal gait pattern moving her crutches simulatenously and a step-to pattern moving one crutch at time. She maintained RLE in excessive ER and has adequate foot clearence, which is basline d/t hx of hip dysplagia. No LOB. Good navigation of the room/hallway.  Home Activity Instructions Home Activity Instructions: Encouraged pt to go on daily walks and compelte B UE/LE exercises to maintain ROM/strength.  Stairs            Modified Rankin (Stroke Patients Only)        Balance Overall balance assessment: Needs assistance Sitting-balance support: Feet supported, Bilateral upper extremity supported Sitting balance-Leahy Scale: Fair Sitting balance - Comments: Pt sat on a raised bed height with BLE in near full extension.   Standing balance support: During functional activity, Bilateral upper extremity supported, Single extremity supported Standing balance-Leahy Scale: Fair Standing balance comment: Pt  changed her gown and underware in standing with intermittent back support and use of  unilateral crutch. She was able to reach outside her BOS without LOB.          Pertinent Vitals/Pain PT - Brief Vital Signs All Vital Signs Stable: Yes Pain Assessment Pain Assessment: No/denies pain     Home Living Family/patient expects to be discharged to:: Private residence Living Arrangements: Children;Other relatives (Daughter and Son-in-Law) Available Help at Discharge: Family;Friend(s);Available PRN/intermittently (Daughter is on HD so is gone for extended periods of time. Son-in-Law had a CVA so unable to physical assist much. Granddaughter helps consistently.) Home Environment: Ramped entrance;Stairs to enter;Rail - right  Stairs-Number of Steps: 4 Home Equipment: Grab bars - tub/shower;Grab bars - toilet;Crutches;Rollator (4 wheels);Cane - single point;BSC/3in1;Hospital bed        Prior Function Level of Independence: Needs assistance Comments: Pt reports she has an adjustable mattress at home that has similar bed features to the hospital bed. She requires increased time to get in/out of bed and intermittently requires minA to bring BLE back into bed. ModI for ambulation using crutches. Denies falls in the last 75mo. Pt reports sometimes sitting on the [facility] when bathing and that it takes increased time for her to clean herself. Granddaughter will assist with baths and LB dressing. Relies on granddaughter or sister-in-law for transportation. Handles cooking, cleaning, medications, and finances. Gets groceries delivered and has food delievered by senior care sometimes.    UE/LE Assessment   UE ROM/Strength/Tone/Coordination: WFL    LE ROM/Strength/Tone/Coordination: [facility]      Communication   Communication Communication: No apparent difficulties     Cognition Overall Cognitive Status: Appears within functional limits for tasks assessed/performed       General Comments      Exercises     Assessment/Plan    PT Problem List         PT Visit Diagnosis Other  abnormalities of gait and mobility (R26.89);Difficulty in walking, not elsewhere classified (R26.2)    No Skilled PT Patient at baseline level of functioning;All education completed;Patient will have necessary level of assist by caregiver at discharge   Co-evaluation                AMPAC 6 Clicks Help needed turning from your back to your side while in a flat bed without using bedrails?: A Little Help needed moving from lying on your back to sitting on the side of a flat bed without using bedrails?: A Little Help needed moving to and from a bed to a chair (including a wheelchair)?: None Help needed standing up from a chair using your arms (e.g., wheelchair or bedside chair)?: None Help needed to walk in hospital room?: None Help needed climbing 3-5 steps with a railing? : A Little 6 Click Score: 21      End of Session Equipment Utilized During Treatment: Other (comment) (Pt refused gait belt) Activity Tolerance: Patient tolerated treatment well Patient left: in bed;with call bell/phone within reach;with bed alarm set;with family/visitor present Nurse Communication: Mobility status;Other (comment) (Need for new purewick) PT Visit Diagnosis: Other abnormalities of gait and mobility (R26.89);Difficulty in walking, not elsewhere classified (R26.2)     Time: 1610-9604 PT Time Calculation (min) (ACUTE ONLY): 39 min  Charges:   PT Evaluation $PT Eval Low Complexity: 1 Low PT Treatments $Therapeutic Activity: 8-22 mins    Glenford Lanes, PT, DPT Acute Rehabilitation Services Office: [phone] Secure Chat Preferred  Riva Chester  02/10/2024, 4:35 PM

## 2024-02-10 NOTE — Assessment & Plan Note (Signed)
 Good diuresis with current regimen.  EF 20 to 25% as of 12/02/2023, repeat unlikely to have utility.  Likely mild exacerbation secondary to A-fib with RVR. - Lasix 40 mg IV twice daily - Appreciate cardiology input as above - Continue home Farxiga 10 mg daily, losartan 50 mg daily, Toprol 25 mg BID - Daily weights and strict I/Os

## 2024-02-10 NOTE — Progress Notes (Signed)
 Progress Note  Patient Name: Donna Herrera Date of Encounter: 02/10/2024  Primary Cardiologist: Ola Berger, MD   Subjective   Feels that the fluid levels are improving. Hasn't really ever experienced palpitations with AF.  Inpatient Medications    Scheduled Meds:  allopurinol  100 mg Oral Daily   apixaban  5 mg Oral BID   dapagliflozin propanediol  10 mg Oral Daily   DULoxetine  30 mg Oral Daily   ezetimibe  10 mg Oral Daily   furosemide  40 mg Intravenous BID   gabapentin  300 mg Oral QHS   losartan  50 mg Oral Daily   metoprolol tartrate  50 mg Oral BID   Continuous Infusions:  cefTRIAXone (ROCEPHIN)  IV     PRN Meds: acetaminophen, albuterol, oxyCODONE   Vital Signs    Vitals:   02/10/24 0519 02/10/24 0520 02/10/24 0610 02/10/24 0900  BP:   125/82 108/77  Pulse: 99 65  93  Resp: 12 13 17 20   Temp:   98.2 F (36.8 C) 97.6 F (36.4 C)  TempSrc:   Oral Oral  SpO2: 95% 96% 98% 98%  Weight:      Height:        Intake/Output Summary (Last 24 hours) at 02/10/2024 1022 Last data filed at 02/10/2024 0520 Gross per 24 hour  Intake 240 ml  Output 1600 ml  Net -1360 ml   Filed Weights   02/09/24 1103 02/09/24 1823 02/10/24 0515  Weight: 73 kg 76.6 kg 79 kg    Telemetry    Atrial fibrillation with largely controlled rates - Personally Reviewed  ECG    Atrial fibrillation with controlled rates - Personally Reviewed  Physical Exam   GEN: No acute distress.   Neck: No JVD Cardiac:i RRR, no murmurs, rubs, or gallops.  Respiratory: Clear to auscultation bilaterally. GI: Soft, nontender, non-distended  MS: No edema; No deformity. Neuro:  Nonfocal  Psych: Normal affect   Labs    Chemistry Recent Labs  Lab 02/09/24 1515 02/10/24 0327  NA 135 137  K 3.5 3.1*  CL 100 100  CO2 19* 23  GLUCOSE 97 96  BUN 13 17  CREATININE 0.71 1.04*  CALCIUM 8.4* 8.9  GFRNONAA >60 55*  ANIONGAP 16* 14     Hematology Recent Labs  Lab 02/09/24 1115  02/10/24 0327  WBC 6.0 5.2  RBC 4.50 4.23  HGB 12.4 11.7*  HCT 41.2 37.0  MCV 91.6 87.5  MCH 27.6 27.7  MCHC 30.1 31.6  RDW 17.2* 16.9*  PLT 229 230    Cardiac EnzymesNo results for input(s): "TROPONINI" in the last 168 hours. No results for input(s): "TROPIPOC" in the last 168 hours.   BNP Recent Labs  Lab 02/09/24 1545  BNP 1,361.3*     DDimer No results for input(s): "DDIMER" in the last 168 hours.   Summary of Pertinent studies    TTE: TEE December 04, 2023 --EF less than 20%.  Moderately to severely dilated LV.  RV moderately enlarged.  Severely dilated left atrium.  Severely dilated right atrium.  Grade 2 aorta plaque.    Patient Profile     78 y.o. female with a history of NICM, HFrEF, persistent Afib, HTN, HL, tob abuse, ILD, Ao atherosclerosis, nl cors on cath 2017, who is being seen today for the evaluation of Afib rvr at the request of Dr. Grandville Lax.   Assessment & Plan     Acute on chronic CHF with reduced ejection fraction Admitted  with gross volume overload, diuresing Continue metoprolol 50mg  PO BID Continue losartan 50mg  daily No jardiance due to UTI Continue diuresis with furosemide IV 40 BID  Permanent atrial fibrillation Has failed Tikosyn with plans in place for CRT pacemaker (scheduled 4/21) & AVJ ablation She has preferred not to take amiodarone due to potential pulmonary toxicity RVR may have been driven by CHF and UTI, rates have been generally well controlled since admission I don't think expedited AVJ ablation will be particularly beneficial and has increased risk with dramatic and abrupt rate change   Secondary hypercoagulable state On Eliquis   Urinary tract infection Per primary    For questions or updates, please contact [facility] Please consult [email or web address] for contact info under Cardiology/STEMI.      Signed, Efraim Grange, MD 02/10/2024, 10:22 AM

## 2024-02-10 NOTE — Assessment & Plan Note (Signed)
>>  ASSESSMENT AND PLAN FOR ACUTE EXACERBATION OF CHF (CONGESTIVE HEART FAILURE) (HCC) WRITTEN ON 02/10/2024  6:19 AM BY HERNANDEZ, KATIE, MD  Good diuresis with current regimen.  EF 20 to 25% as of 12/02/2023, repeat unlikely to have utility.  Likely mild exacerbation secondary to A-fib with RVR. - Lasix  40 mg IV twice daily - Appreciate cardiology input as above - Continue home Farxiga  10 mg daily, losartan  50 mg daily, Toprol  25 mg BID - Daily weights and strict I/Os

## 2024-02-10 NOTE — Assessment & Plan Note (Signed)
-   Trend BMP, replete as indicated

## 2024-02-10 NOTE — Progress Notes (Signed)
 Daily Progress Note Intern Pager: [phone]  Patient name: Donna Herrera Medical record number: [id number] Date of birth: [birth year] Age: 78 y.o. Gender: female  Primary Care Provider: Farris Hong, MD Consultants: Cardiology Code Status: Full code  Pt Overview and Major Events to Date:  4/11: Admitted to [facility]  Assessment and Plan: [name] is a 78 y.o. female presenting with chest pain, shortness of breath and swelling.  PMHx includes A-fib on anticoagulation, HFrEF (EF 20%), tobacco use disorder, interstitial lung disease, HTN, gout, GERD and HLD. Assessment & Plan Atrial fibrillation with RVR (HCC) Now in rate controlled A-fib.  Per cardiology planning for EP evaluation to possibly move up ablation given patient failed outpatient Tikosyn therapy.  TSH normal.  K and mag repleted. - Cardiology recs, appreciate care - Continue home Eliquis 5 mg twice daily and home metoprolol 25 mg twice daily - Trend BMP and mag, replete to goal K >4 and Mag >2 Acute exacerbation of CHF (congestive heart failure) (HCC) Good diuresis with current regimen.  EF 20 to 25% as of 12/02/2023, repeat unlikely to have utility.  Likely mild exacerbation secondary to A-fib with RVR. - Lasix 40 mg IV twice daily - Appreciate cardiology input as above - Continue home Farxiga 10 mg daily, losartan 50 mg daily, Toprol 25 mg BID - Daily weights and strict I/Os Acute lower UTI Symptomatic, continue on ceftriaxone until urine culture sensitivities available to narrow antibiotic coverage.  Given frank hematuria would recommend outpatient urology follow-up - Await UCx results, sensitivities - Urology outpatient follow-up for frank hematuria in the setting of tobacco use Hypokalemia - Trend BMP, replete as indicated Chronic health problem HTN: Losartan 50 mg as above. Hip dysplasia: Continue home crutches.  PT/OT. Gout: Continue allopurinol 100 mg daily Chronic pain: Continue  acetaminophen for mild/moderate pain, and home as needed oxycodone for severe pain Depression: Continue Cymbalta Vitamin D deficiency: Hold weekly vitamin D HLD: Continue ezetimibe, lipid panel stable GERD: Continue famotidine Neuropathy: Continue gabapentin 300 mg nightly Prediabetes: A1c 5.6  FEN/GI: Eliquis PPx: Heart healthy Dispo: Home pending further cardiology evaluation  Subjective:  Assessed at bedside, no concerns.  Eating and drinking well.  States she is "peeing a lot".  Denies suprapubic pain.  Objective: Temp:  [98.2 F (36.8 C)-98.9 F (37.2 C)] 98.5 F (36.9 C) (04/12 0011) Pulse Rate:  [61-100] 64 (04/12 0011) Resp:  [11-26] 15 (04/12 0011) BP: (118-137)/(69-92) 131/69 (04/12 0011) SpO2:  [96 %-100 %] 96 % (04/12 0011) Weight:  [73 kg-76.6 kg] 76.6 kg (04/11 1823) Physical Exam: General: Resting comfortably in bed, NAD Cardiovascular: Irregularly irregular rhythm.  Regular rate.  No murmur. Respiratory: CTAB.  Normal work of breathing on room air. Abdomen: Soft, nontender, nondistended Extremities: Minimal edema peripherally  Laboratory: Most recent CBC Lab Results  Component Value Date   WBC 6.0 02/09/2024   HGB 12.4 02/09/2024   HCT 41.2 02/09/2024   MCV 91.6 02/09/2024   PLT 229 02/09/2024   Most recent BMP    Latest Ref Rng & Units 02/09/2024    3:15 PM  BMP  Glucose 70 - 99 mg/dL 97   BUN 8 - 23 mg/dL 13   Creatinine 1.47 - 1.00 mg/dL 8.29   Sodium 562 - 130 mmol/L 135   Potassium 3.5 - 5.1 mmol/L 3.5   Chloride 98 - 111 mmol/L 100   CO2 22 - 32 mmol/L 19   Calcium 8.9 - 10.3 mg/dL 8.4  Other pertinent labs: BMP: 1361 Troponin: 24 > 29 A1c: 5.6 RVP: Negative  Imaging/Diagnostic Tests: US  RENAL Result Date: 02/09/2024 IMPRESSION: Unremarkable renal ultrasound.   DG Chest Port 1 View Result Date: 02/09/2024 IMPRESSION: Cardiomegaly.  No pneumonia or pulmonary edema.      Jonne Netters, MD 02/10/2024, 1:28 AM  PGY-2,  [facility] FPTS Intern pager: [phone], text pages welcome Secure chat group [facility] Teaching Service

## 2024-02-10 NOTE — Assessment & Plan Note (Signed)
 Symptomatic, continue on ceftriaxone until urine culture sensitivities available to narrow antibiotic coverage.  Given frank hematuria would recommend outpatient urology follow-up - Await UCx results, sensitivities - Urology outpatient follow-up for frank hematuria in the setting of tobacco use

## 2024-02-10 NOTE — Progress Notes (Signed)
  ***   Daily Progress Note Intern Pager: [phone]  Patient name: Donna Herrera Medical record number: [id number] Date of birth: 1946/08/17 Age: 78 y.o. Gender: female  Primary Care Provider: Farris Hong, MD Consultants: Cardiology  Code Status: FULL  Pt Overview and Major Events to Date:  02/09/24: Admit FMTS   Assessment and Plan:  Donna Herrera is a 78 y.o. female p/f chest pain, dyspnea. Pertinent PMH/PSH includes Afib on AC, HFrEF-EF 20%, Tobacco use, ILD, HTN, gout, GERD, HLD.  Assessment & Plan Atrial fibrillation with RVR (HCC) In setting of UTI, now in rate controlled A-fib.  EP ablation scheduling per cardiology. TSH normal.   - Cardiology recs, appreciate care - Continue home Eliquis 5 mg twice daily and home metoprolol 25 mg twice daily - Trend BMP and mag, replete to goal K >4 and Mag >2 Acute exacerbation of CHF (congestive heart failure) (HCC) Good diuresis with current regimen.  EF 20 to 25% as of 12/02/2023, repeat unlikely to have utility.  Likely mild exacerbation secondary to A-fib with RVR. - Lasix 40 mg IV twice daily - Appreciate cardiology input as above - Continue home Farxiga 10 mg daily, losartan 50 mg daily, Toprol 25 mg BID - Daily weights and strict I/Os Acute lower UTI Symptomatic, continue on ceftriaxone until urine culture sensitivities available to narrow antibiotic coverage.  Given frank hematuria would recommend outpatient urology follow-up - Await UCx results, sensitivities - Urology outpatient follow-up for frank hematuria in the setting of tobacco use Hypokalemia - Trend BMP, replete as indicated Chronic health problem HTN: Losartan 50 mg as above. Hip dysplasia: Continue home crutches.  PT/OT. Gout: Continue allopurinol 100 mg daily Chronic pain: Continue acetaminophen for mild/moderate pain, and home as needed oxycodone for severe pain Depression: Continue Cymbalta Vitamin D deficiency: Hold weekly vitamin D HLD:  Continue ezetimibe, lipid panel stable GERD: Continue famotidine Neuropathy: Continue gabapentin 300 mg nightly Prediabetes: A1c 5.6    FEN/GI: Heart Healthy  PPx: Eliquis  Dispo:{FPTSDISOLIST:27587} {FPTSDISOTIME:27588}. Barriers include ***.   Subjective:  ***  Objective: Temp:  [97.5 F (36.4 C)-98.9 F (37.2 C)] 98.8 F (37.1 C) (04/12 1941) Pulse Rate:  [45-139] 45 (04/12 1942) Resp:  [11-21] 19 (04/12 1942) BP: (108-136)/(68-117) 115/81 (04/12 1941) SpO2:  [95 %-99 %] 95 % (04/12 1942) Weight:  [79 kg] 79 kg (04/12 0515) Physical Exam: General: *** Cardiovascular: *** Respiratory: *** Abdomen: *** Extremities: ***  Laboratory: Most recent CBC Lab Results  Component Value Date   WBC 5.2 02/10/2024   HGB 11.7 (L) 02/10/2024   HCT 37.0 02/10/2024   MCV 87.5 02/10/2024   PLT 230 02/10/2024   Most recent BMP    Latest Ref Rng & Units 02/10/2024    2:31 PM  BMP  Glucose 70 - 99 mg/dL 147   BUN 8 - 23 mg/dL 18   Creatinine 8.29 - 1.00 mg/dL 5.62   Sodium 130 - 865 mmol/L 134   Potassium 3.5 - 5.1 mmol/L 5.0   Chloride 98 - 111 mmol/L 99   CO2 22 - 32 mmol/L 24   Calcium 8.9 - 10.3 mg/dL 8.7      Ernestina Headland, MD 02/10/2024, 8:45 PM  PGY-3, [facility] FPTS Intern pager: [phone], text pages welcome Secure chat group [facility] Teaching Service

## 2024-02-10 NOTE — Assessment & Plan Note (Signed)
 In setting of UTI, now in rate controlled A-fib.  EP ablation scheduling per cardiology. TSH normal.   - Cardiology recs, appreciate care - Continue home Eliquis 5 mg twice daily and home metoprolol 25 mg twice daily - Trend BMP and mag, replete to goal K >4 and Mag >2

## 2024-02-10 NOTE — Assessment & Plan Note (Signed)
 HTN: Losartan 50 mg as above. Hip dysplasia: Continue home crutches.  PT/OT. Gout: Continue allopurinol 100 mg daily Chronic pain: Continue acetaminophen for mild/moderate pain, and home as needed oxycodone for severe pain Depression: Continue Cymbalta Vitamin D deficiency: Hold weekly vitamin D HLD: Continue ezetimibe, lipid panel stable GERD: Continue famotidine Neuropathy: Continue gabapentin 300 mg nightly Prediabetes: A1c 5.6

## 2024-02-10 NOTE — Assessment & Plan Note (Signed)
>>  ASSESSMENT AND PLAN FOR ACUTE EXACERBATION OF CHF (CONGESTIVE HEART FAILURE) (HCC) WRITTEN ON 02/11/2024  6:03 AM BY MAXWELL, ALLEE, MD  Good diuresis with current regimen.  EF 20 to 25% as of 12/02/2023, repeat unlikely to have utility.  Likely mild exacerbation secondary to A-fib with RVR. Net negative 3.6 L. Weight 78 kg>73 kg.  - Lasix  40 mg IV twice daily, per cardiology  - Appreciate cardiology input as above - Continue home Farxiga  10 mg daily, losartan  50 mg daily, Toprol  25 mg BID - Daily weights and strict I/Os

## 2024-02-10 NOTE — Care Management Obs Status (Signed)
 MEDICARE OBSERVATION STATUS NOTIFICATION   Patient Details  Name: Donna Herrera MRN: [id number] Date of Birth: 1946-07-28   Medicare Observation Status Notification Given:  Yes    Omie Bickers, RN 02/10/2024, 9:16 AM

## 2024-02-10 NOTE — Plan of Care (Signed)
 Patient seen at bedside for reports of leg, feet, and stomach cramping per nursing.   Upon encounter, patient reports easing of her symptoms. No chest pain or difficulty breathing.   Exam: General: Well-appearing CV: Irregular S1/S2. Regular rate. No murmurs. Warm and well-perfused.  Pulm: Breathing comfortably on room air. CTAB anteriorly. No increased WOB.  Abd: Normoactive BS. Soft, nondistended. Mild tenderness of R side.  Ext: Edematous BLE to level of knee. 2+ DP pulses bilaterally. Moves all extremities spontaneously.   Rate controlled afib noted on monitor.   Plan: Suspect transient muscle cramping secondary to hypokalemia. Patient received repletion, now symptomatically improving. Rest of care plan per FMTS Progress Note.

## 2024-02-10 NOTE — Assessment & Plan Note (Addendum)
 Good diuresis with current regimen.  EF 20 to 25% as of 12/02/2023, repeat unlikely to have utility.  Likely mild exacerbation secondary to A-fib with RVR. - Lasix 40 mg IV twice daily - Appreciate cardiology input as above - Continue home Farxiga 10 mg daily, losartan 50 mg daily, Toprol 25 mg BID - Daily weights and strict I/Os

## 2024-02-11 DIAGNOSIS — I509 Heart failure, unspecified: Secondary | ICD-10-CM | POA: Diagnosis not present

## 2024-02-11 DIAGNOSIS — N390 Urinary tract infection, site not specified: Secondary | ICD-10-CM | POA: Diagnosis not present

## 2024-02-11 DIAGNOSIS — I4891 Unspecified atrial fibrillation: Secondary | ICD-10-CM | POA: Diagnosis not present

## 2024-02-11 DIAGNOSIS — I4811 Longstanding persistent atrial fibrillation: Secondary | ICD-10-CM | POA: Diagnosis not present

## 2024-02-11 LAB — CBC
HCT: 35.1 % — ABNORMAL LOW (ref 36.0–46.0)
Hemoglobin: 11 g/dL — ABNORMAL LOW (ref 12.0–15.0)
MCH: 27.8 pg (ref 26.0–34.0)
MCHC: 31.3 g/dL (ref 30.0–36.0)
MCV: 88.9 fL (ref 80.0–100.0)
Platelets: 208 10*3/uL (ref 150–400)
RBC: 3.95 MIL/uL (ref 3.87–5.11)
RDW: 16.8 % — ABNORMAL HIGH (ref 11.5–15.5)
WBC: 5.3 10*3/uL (ref 4.0–10.5)
nRBC: 0 % (ref 0.0–0.2)

## 2024-02-11 LAB — MAGNESIUM: Magnesium: 2 mg/dL (ref 1.7–2.4)

## 2024-02-11 LAB — BASIC METABOLIC PANEL WITH GFR
Anion gap: 11 (ref 5–15)
BUN: 19 mg/dL (ref 8–23)
CO2: 25 mmol/L (ref 22–32)
Calcium: 8.8 mg/dL — ABNORMAL LOW (ref 8.9–10.3)
Chloride: 99 mmol/L (ref 98–111)
Creatinine, Ser: 0.88 mg/dL (ref 0.44–1.00)
GFR, Estimated: >60 mL/min (ref >60)
Glucose, Bld: 110 mg/dL — ABNORMAL HIGH (ref 70–99)
Potassium: 3.6 mmol/L (ref 3.5–5.1)
Sodium: 135 mmol/L (ref 135–145)

## 2024-02-11 MED ORDER — SODIUM CHLORIDE 0.9 % IV SOLN: INTRAVENOUS | Status: DC | PRN

## 2024-02-11 MED ORDER — POTASSIUM CHLORIDE 20 MEQ PO PACK
60.0000 meq | PACK | Freq: Once | ORAL | Status: AC
  Administered 2024-02-11: 60 meq via ORAL
  Filled 2024-02-11: qty 3

## 2024-02-11 NOTE — Plan of Care (Signed)
  Problem: Education: Goal: Knowledge of General Education information will improve Description: Including pain rating scale, medication(s)/side effects and non-pharmacologic comfort measures Outcome: Progressing   Problem: Clinical Measurements: Goal: Respiratory complications will improve Outcome: Progressing   Problem: Elimination: Goal: Will not experience complications related to bowel motility Outcome: Progressing Goal: Will not experience complications related to urinary retention Outcome: Progressing   Problem: Education: Goal: Knowledge of disease or condition will improve Outcome: Progressing   Problem: Clinical Measurements: Goal: Cardiovascular complication will be avoided Outcome: Not Progressing

## 2024-02-11 NOTE — Plan of Care (Signed)

## 2024-02-11 NOTE — Progress Notes (Signed)
 Progress Note  Patient Name: Donna Herrera Date of Encounter: 02/11/2024  Primary Cardiologist: Ola Berger, MD   Subjective   Feels that the fluid levels are improving. Hasn't really ever experienced palpitations with AF.  Inpatient Medications    Scheduled Meds:  allopurinol  100 mg Oral Daily   apixaban  5 mg Oral BID   dapagliflozin propanediol  10 mg Oral Daily   DULoxetine  30 mg Oral Daily   ezetimibe  10 mg Oral Daily   furosemide  40 mg Intravenous BID   gabapentin  300 mg Oral QHS   losartan  50 mg Oral Daily   metoprolol tartrate  50 mg Oral BID   Continuous Infusions:  cefTRIAXone (ROCEPHIN)  IV 1 g (02/10/24 1352)   PRN Meds: acetaminophen, albuterol, oxyCODONE, tiZANidine   Vital Signs    Vitals:   02/10/24 2333 02/10/24 2334 02/11/24 0515 02/11/24 0828  BP:  113/86 (!) 110/58 (!) 111/91  Pulse:  84 81 95  Resp: 15 17 14 16   Temp:  98.8 F (37.1 C) 98 F (36.7 C) 98.3 F (36.8 C)  TempSrc:  Oral Oral Oral  SpO2:  95% 92% 94%  Weight:   78.4 kg   Height:        Intake/Output Summary (Last 24 hours) at 02/11/2024 0908 Last data filed at 02/10/2024 2335 Gross per 24 hour  Intake 297 ml  Output 2600 ml  Net -2303 ml   Filed Weights   02/09/24 1823 02/10/24 0515 02/11/24 0515  Weight: 76.6 kg 79 kg 78.4 kg    Telemetry    Atrial fibrillation with largely controlled rates - Personally Reviewed  ECG    Atrial fibrillation with controlled rates - Personally Reviewed  Physical Exam   GEN: No acute distress.   Neck: No JVD Cardiac:i RRR, no murmurs, rubs, or gallops.  Respiratory: Clear to auscultation bilaterally. GI: Soft, nontender, non-distended  MS: No edema; No deformity. Neuro:  Nonfocal  Psych: Normal affect   Labs    Chemistry Recent Labs  Lab 02/10/24 0327 02/10/24 1431 02/11/24 0235  NA 137 134* 135  K 3.1* 5.0 3.6  CL 100 99 99  CO2 23 24 25   GLUCOSE 96 132* 110*  BUN 17 18 19   CREATININE 1.04* 0.89  0.88  CALCIUM 8.9 8.7* 8.8*  GFRNONAA 55* >60 >60  ANIONGAP 14 11 11      Hematology Recent Labs  Lab 02/09/24 1115 02/10/24 0327 02/11/24 0235  WBC 6.0 5.2 5.3  RBC 4.50 4.23 3.95  HGB 12.4 11.7* 11.0*  HCT 41.2 37.0 35.1*  MCV 91.6 87.5 88.9  MCH 27.6 27.7 27.8  MCHC 30.1 31.6 31.3  RDW 17.2* 16.9* 16.8*  PLT 229 230 208    Cardiac EnzymesNo results for input(s): "TROPONINI" in the last 168 hours. No results for input(s): "TROPIPOC" in the last 168 hours.   BNP Recent Labs  Lab 02/09/24 1545  BNP 1,361.3*     DDimer No results for input(s): "DDIMER" in the last 168 hours.   Summary of Pertinent studies    TTE: TEE December 04, 2023 --EF less than 20%.  Moderately to severely dilated LV.  RV moderately enlarged.  Severely dilated left atrium.  Severely dilated right atrium.  Grade 2 aorta plaque.    Patient Profile     78 y.o. female with a history of NICM, HFrEF, persistent Afib, HTN, HL, tob abuse, ILD, Ao atherosclerosis, nl cors on cath 2017, who is being  seen today for the evaluation of Afib rvr at the request of Dr. Grandville Lax.   Assessment & Plan     Acute on chronic CHF with reduced ejection fraction Admitted with gross volume overload, diuresing Continue metoprolol 50mg  PO BID Continue losartan 50mg  daily No jardiance due to UTI Continue diuresis with furosemide IV 40 BID  Permanent atrial fibrillation Has failed Tikosyn with plans in place for CRT pacemaker (scheduled 4/21) & AVJ ablation She has preferred not to take amiodarone due to potential pulmonary toxicity RVR may have been driven by CHF and UTI, rates have been generally well controlled since admission I don't think expedited AVJ ablation will be particularly beneficial and has increased risk with dramatic and abrupt rate change   Secondary hypercoagulable state On Eliquis   Urinary tract infection Per primary   For questions or updates, please contact [facility] Please consult  [email or web address] for contact info under Cardiology/STEMI.      Signed, Efraim Grange, MD 02/11/2024, 9:08 AM

## 2024-02-12 ENCOUNTER — Other Ambulatory Visit (HOSPITAL_COMMUNITY): Payer: Self-pay

## 2024-02-12 DIAGNOSIS — I501 Left ventricular failure: Secondary | ICD-10-CM | POA: Diagnosis not present

## 2024-02-12 DIAGNOSIS — I4891 Unspecified atrial fibrillation: Secondary | ICD-10-CM | POA: Diagnosis not present

## 2024-02-12 DIAGNOSIS — N390 Urinary tract infection, site not specified: Secondary | ICD-10-CM | POA: Diagnosis not present

## 2024-02-12 LAB — BASIC METABOLIC PANEL WITH GFR
Anion gap: 9 (ref 5–15)
BUN: 16 mg/dL (ref 8–23)
CO2: 30 mmol/L (ref 22–32)
Calcium: 8.9 mg/dL (ref 8.9–10.3)
Chloride: 98 mmol/L (ref 98–111)
Creatinine, Ser: 0.92 mg/dL (ref 0.44–1.00)
GFR, Estimated: >60 mL/min (ref >60)
Glucose, Bld: 88 mg/dL (ref 70–99)
Potassium: 4.2 mmol/L (ref 3.5–5.1)
Sodium: 137 mmol/L (ref 135–145)

## 2024-02-12 LAB — MAGNESIUM: Magnesium: 1.9 mg/dL (ref 1.7–2.4)

## 2024-02-12 LAB — CBC
HCT: 35.6 % — ABNORMAL LOW (ref 36.0–46.0)
Hemoglobin: 10.9 g/dL — ABNORMAL LOW (ref 12.0–15.0)
MCH: 27.1 pg (ref 26.0–34.0)
MCHC: 30.6 g/dL (ref 30.0–36.0)
MCV: 88.6 fL (ref 80.0–100.0)
Platelets: 234 10*3/uL (ref 150–400)
RBC: 4.02 MIL/uL (ref 3.87–5.11)
RDW: 16.8 % — ABNORMAL HIGH (ref 11.5–15.5)
WBC: 5.2 10*3/uL (ref 4.0–10.5)
nRBC: 0 % (ref 0.0–0.2)

## 2024-02-12 LAB — URINE CULTURE: Culture: >=100000 — AB

## 2024-02-12 MED ORDER — METOPROLOL TARTRATE 50 MG PO TABS
50.0000 mg | ORAL_TABLET | Freq: Two times a day (BID) | ORAL | 0 refills | Status: DC
  Filled 2024-02-12: qty 60, 30d supply, fill #0

## 2024-02-12 MED ORDER — FUROSEMIDE 40 MG PO TABS
40.0000 mg | ORAL_TABLET | Freq: Every day | ORAL | 0 refills | Status: DC
Start: 2024-02-12 — End: 2024-06-19
  Filled 2024-02-12: qty 30, 30d supply, fill #0

## 2024-02-12 MED ORDER — MAGNESIUM SULFATE 2 GM/50ML IV SOLN
2.0000 g | Freq: Once | INTRAVENOUS | Status: AC
  Administered 2024-02-12: 2 g via INTRAVENOUS
  Filled 2024-02-12: qty 50

## 2024-02-12 MED ORDER — COLCHICINE 0.6 MG PO TABS
0.6000 mg | ORAL_TABLET | Freq: Every day | ORAL | 1 refills | Status: DC
Start: 2024-02-12 — End: 2024-05-02
  Filled 2024-02-12: qty 30, 30d supply, fill #0

## 2024-02-12 NOTE — Assessment & Plan Note (Deleted)
 Cardiology awaiting EP evaluation for expediting CRT pacemaker placement (scheduled for 4/21).EP do not feel expediting ablation is beneficial at this time.  - Cardiology recs, appreciate care -Continue home Eliquis 5 mg twice daily, continue increased dose of metoprolol 25 mg twice daily -Trending BMP and mag, replete goal of K greater than 4 and mag greater than 2

## 2024-02-12 NOTE — Progress Notes (Signed)
 Heart Failure Navigator Progress Note  Assessed for Heart & Vascular TOC clinic readiness.  Patient does not meet criteria due to has a scheduled [facility] appointment on 02/27/2024. .   Navigator will sign off at this time.   Randie Bustle, BSN, Scientist, clinical (histocompatibility and immunogenetics) Only

## 2024-02-12 NOTE — Progress Notes (Signed)
 Mobility Specialist Progress Note:   02/12/24 1100  Mobility  Activity  (bed level ex)  Assistive Device None  Distance Ambulated (ft)  (pt refused)  Range of Motion/Exercises Active;All extremities  Activity Response Tolerated well  Mobility Referral Yes  Mobility visit 1 Mobility  Mobility Specialist Start Time (ACUTE ONLY) 1100  Mobility Specialist Stop Time (ACUTE ONLY) 1110  Mobility Specialist Time Calculation (min) (ACUTE ONLY) 10 min   Pt agreeable to mobility session. Declined OOB mobility d/t R low back pain. Able to perform multiple bed level exercises. Agreeable to ambulate with crutches this pm. Will return as schedule allows.   Donna Herrera Mobility Specialist Please contact via SecureChat or  Rehab office at [phone]

## 2024-02-12 NOTE — Assessment & Plan Note (Deleted)
 Hypertension: Continue losartan 50 mg Hip dysplasia: Continue home crutches, PT/OT Gout: Continue allopurinol 100 mg daily Chronic pain: Continue Tylenol for mild-moderate pain and as needed oxycodone for severe pain Depression: Continue Cymbalta Hyperlipidemia: Continue ezetimibe GERD continue famotidine Neuropathy: Continue gabapentin 300 mg nightly

## 2024-02-12 NOTE — Care Management (Addendum)
 Transition of Care [facility]) - Inpatient Brief Assessment   Patient Details  Name: Fiona Coto MRN: [id number] Date of Birth: [birth year]  [facility]) CM/SW Contact:    Ronni Colace, RN Phone Number: 02/12/2024, 10:30 AM   Clinical Narrative:  77 yo presented with palpitation, failed tykosen, does not want to take amiodarone  will get pacemaker today. Does have some social isolation on SDOH. Will add resources to AVS N further needs identified.  The patient will be discussed in daily progressive rounds. If a needs is identified please place a TOC consult Has follow up appts already scheduled. Transition of Care Asessment: Insurance and Status: Insurance coverage has been reviewed Patient has primary care physician: Yes     Prior/Current Home Services: No current home services Social Drivers of Health Review: SDOH reviewed needs interventions Readmission risk has been reviewed: Yes Transition of care needs: no transition of care needs at this time

## 2024-02-12 NOTE — Assessment & Plan Note (Deleted)
 Urine output of 1.7 L overnight.  Weight down from 78 kg to 75 kg today. Dry weight appears to be around 71-73 kg.  - Continue Lasix 40 mg IV twice daily today, per cardiology -will consider adjusting outpatient Lasix dosing prior to discharge  -will consider discontiuing Farxiga 10 mg daily, losartan 50 mg daily continue increased dose of metoprolol 25 mg twice daily -Daily weights and strict I/Os

## 2024-02-12 NOTE — Progress Notes (Signed)
 Progress Note  Patient Name: Donna Herrera Date of Encounter: 02/12/2024  Primary Cardiologist:   Dietrich Pates, MD   Subjective   She reports that she is breathing OK.  No pain.   Inpatient Medications    Scheduled Meds:  allopurinol  100 mg Oral Daily   apixaban  5 mg Oral BID   dapagliflozin propanediol  10 mg Oral Daily   DULoxetine  30 mg Oral Daily   ezetimibe  10 mg Oral Daily   furosemide  40 mg Intravenous BID   gabapentin  300 mg Oral QHS   losartan  50 mg Oral Daily   metoprolol tartrate  50 mg Oral BID   Continuous Infusions:  sodium chloride 10 mL/hr at 02/11/24 1631   cefTRIAXone (ROCEPHIN)  IV 1 g (02/11/24 1634)   PRN Meds: sodium chloride, acetaminophen, albuterol, oxyCODONE, tiZANidine   Vital Signs    Vitals:   02/12/24 0004 02/12/24 0508 02/12/24 0714 02/12/24 0741  BP: 110/83 103/69 113/77 108/73  Pulse: 61 79 87 93  Resp: 17 18 17    Temp: 98.4 F (36.9 C) 98.1 F (36.7 C) 98.7 F (37.1 C) 98.2 F (36.8 C)  TempSrc: Oral Oral Oral Oral  SpO2: 98% 94% 95% 95%  Weight:  75.1 kg    Height:        Intake/Output Summary (Last 24 hours) at 02/12/2024 0841 Last data filed at 02/12/2024 0509 Gross per 24 hour  Intake 170 ml  Output 1850 ml  Net -1680 ml   Filed Weights   02/10/24 0515 02/11/24 0515 02/12/24 0508  Weight: 79 kg 78.4 kg 75.1 kg    Telemetry    Brief runs of NSVT with isolated PVCs.  Atrial fib with controlled ventricular rate with some episodes of bradycardia.  - Personally Reviewed  ECG    NA  - Personally Reviewed  Physical Exam   GEN: No acute distress.   Neck: No  JVD Cardiac: Irregular RR, no murmurs, rubs, or gallops.  Respiratory: Clear  to auscultation bilaterally. GI: Soft, nontender, non-distended  MS: No  edema; No deformity. Neuro:  Nonfocal  Psych: Normal affect   Labs    Chemistry Recent Labs  Lab 02/10/24 1431 02/11/24 0235 02/12/24 0254  NA 134* 135 137  K 5.0 3.6 4.2  CL  99 99 98  CO2 24 25 30   GLUCOSE 132* 110* 88  BUN 18 19 16   CREATININE 0.89 0.88 0.92  CALCIUM 8.7* 8.8* 8.9  GFRNONAA >60 >60 >60  ANIONGAP 11 11 9      Hematology Recent Labs  Lab 02/10/24 0327 02/11/24 0235 02/12/24 0254  WBC 5.2 5.3 5.2  RBC 4.23 3.95 4.02  HGB 11.7* 11.0* 10.9*  HCT 37.0 35.1* 35.6*  MCV 87.5 88.9 88.6  MCH 27.7 27.8 27.1  MCHC 31.6 31.3 30.6  RDW 16.9* 16.8* 16.8*  PLT 230 208 234    Cardiac EnzymesNo results for input(s): "TROPONINI" in the last 168 hours. No results for input(s): "TROPIPOC" in the last 168 hours.   BNP Recent Labs  Lab 02/09/24 1545  BNP 1,361.3*     DDimer No results for input(s): "DDIMER" in the last 168 hours.   Radiology    No results found.  Cardiac Studies   NA  Patient Profile     78 y.o. female with a history of NICM, HFrEF, persistent Afib, HTN, HL, tob abuse, ILD, Ao atherosclerosis, nl cors on cath 2017, who is being seen for the  evaluation of Afib rvr at the request of Dr. Grandville Lax.   Assessment & Plan    Acute on chronic CHF:   Net negative at least 1680.  Incomplete.   Weight seems to be trending down.   Continue current IV diuresis.   Continue Eliquis.   Permanent atrial fib:  Failed Tikosyn.  Plans in place for CRT and AV ablation.  This is planned tentatively for June and there was no plan to move this up on the schedule when she was seen by Dr. Arlester Ladd yesterday.  For questions or updates, please contact [facility] Please consult [email or web address] for contact info under Cardiology/STEMI.   Signed, Eilleen Grates, MD  02/12/2024, 8:41 AM

## 2024-02-12 NOTE — Assessment & Plan Note (Deleted)
 Sensitivities returned. Patient is s/p ceftriaxone (4/12-4/15). Patient should be adequately covered.  - will d/c ceftriaxone after today - will consider holding farxiga -Urology outpatient for concern of frank hematuria

## 2024-02-12 NOTE — Discharge Instructions (Addendum)
 Dear Donna Herrera,  Thank you for letting us  participate in your care. You were hospitalized for having fast heart rate at your doctors office and diagnosed with Atrial fibrillation with RVR (HCC) and an episode of worsening of your heart failure. We treated you with diuretics (lasix) and  you were seen by cardiology who recommended increasing your dose of metoprolol to twice a day. You were also found to have a urinary tract infection, which we treated with antibiotics.   POST-HOSPITAL & CARE INSTRUCTIONS Please follow up with your PCP Please schedule follow up with your heart failure and A fib team Go to your follow up appointments (listed below)   DOCTOR'S APPOINTMENT   Future Appointments  Date Time Provider Department Center  02/22/2024  9:00 AM Denson Flake, MD [facility] None  02/27/2024  2:45 PM Marlyse Single T, PA-C [facility]  04/29/2024  8:30 AM [facility] ANNUAL WELLNESS VISIT [facility]     Take care and be well!  Family Medicine Teaching Service Inpatient Team [place]  [facility]  [address] [place], [place] [address] [phone]    Social Mining engineer [employer] [facility] [place] 9.1(47)  Senior citizen center [address] "They provide a lot of different services for Seniors"  [employer] [facility] Recreation Seniors 4.8(5)  Senior citizen center [employer] Open now  [employer] No reviews  Senior citizen center [address]  [facility] 4.6(147)  Recreation center [address] "Finally find a place to be active with older Senior 50 & up."  [employer] - Products and services for active seniors No reviews  Senior citizen center [address]  Discover more places   Archivist No reviews  Designer, jewellery center [address]  [employer] No reviews  Senior citizen center [address] Open now  [employer] [facility] 4.0(28)  Senior citizen center [address] "All for people 50 and older."  Geographical information systems officer of [place] 5.0(1)  Designer, jewellery center [address] Temporarily closed  [facility] 5.0(1)  Senior citizen center [address]  Recently opened  [place] [facility] [employer] 4.0(25)  Retirement community [address] "Residents enjoy many amenities and fun activities."  [employer] [place] 4.5(20)  Senior citizen center [address] Production manager & caregivers are very caring to the residents."

## 2024-02-12 NOTE — Discharge Summary (Addendum)
 Family Medicine Teaching [facility] Discharge Summary  Patient name: Donna Herrera Medical record number: [id number] Date of birth: November 06, 1945 Age: 78 y.o. Gender: female Date of Admission: 02/09/2024  Date of Discharge: 02/12/24  Admitting Physician: Gwyndolyn Lerner, MD  Primary Care Provider: Farris Hong, MD Consultants: Cardiology, EP  Indication for Hospitalization: Dyspnea  Discharge Diagnoses/Problem List:  Principal Problem for Admission: A-fib with RVR, acute CHF exacerbation Other Problems addressed during stay:  Principal Problem:   Atrial fibrillation with RVR (HCC) Active Problems:   HLD (hyperlipidemia)   Hypokalemia   Congestive heart failure (HCC)   Longstanding persistent atrial fibrillation (HCC)   HFrEF (heart failure with reduced ejection fraction) (HCC)   Acute lower UTI   Chronic health problem   Acute exacerbation of CHF (congestive heart failure) (HCC)   Gross hematuria   Brief Hospital Course:  Donna Herrera is a 78 y.o.female with a history of A-fib with RVR, ILD, HFrEF (EF: 20 to 25%) GERD, HLD, and gout who was admitted to the family medicine teaching Service at [facility] for A-fib with RVR. Her hospital course is detailed below:  Atrial fibrillation with RVR Heart rate to 150s in ED, converted to A-fib without RVR spontaneously. Admitted to med tele.   Continued home Eliquis 5 mg.  Restarted home medications.  Cardiology consulted, recommended continuing Eliquis, increasing metoprolol to 50 mg twice daily.  Patient is scheduled for a CRT pacemaker and an AVJ ablation next week, cards did not recommend doing these procedures earlier.  On discharge, patient was rate controlled. Of note, patient was initially not started on beta blocker due to concern of bradycardia. Patients HR remained within normal limits during this admission.  Acute exacerbation of CHF EF 20 to 25% in February -- BNP 1361.  Appeared volume up in bilateral  lower extremities, however minimal pulmonary hypertension involvement.  Started on IV Lasix 40 mg twice daily during admission.  Required occasional potassium supplementation throughout diuresis course and was discharged on supplemental potassium. Cardiology saw patient prior to discharge and agreed to discharging patient on oral lasix dosing. Patient was taking lasix 20 mg MWF prior to admission, which was adjusted to 40mg  daily per cardiology recs at discharge and 10mEq potassium chloride.   UTI UA on admit positive for bacteria, leukocytes, nitrite, hemoglobin (frank hematuria) as well as protein.  Creatinine at baseline.  Urine culture showed E. Coli.  Started ceftriaxone on admission and received three day course. Patient without symptoms at discharge.    Other chronic conditions were medically managed with home medications and formulary alternatives as necessary (hypertension, hip dysplasia, gout, chronic pain, depression, vitamin D deficiency, hyperlipidemia, GERD, neuropathy, prediabetes)  PCP Follow-up Recommendations: Will need close cards follow-up BMP to track potassium  Repeat UA for hematuria, consider referral to urology  Consider stopping farxiga given she had UTI. Discuss with patient   Disposition: Home  Discharge Condition: Stable  Discharge Exam:  Vitals:   02/12/24 0741 02/12/24 1051  BP: 108/73 105/67  Pulse: 93 77  Resp:  19  Temp: 98.2 F (36.8 C) 98.1 F (36.7 C)  SpO2: 95% 94%   General: A&O, NAD HEENT: No sign of trauma, EOM grossly intact Cardiac: irregularly irregular, no m/r/g Respiratory: mild crackles bilaterally, NWOB on RA  GI: Soft, NTTP, non-distended  Extremities: 1+ pitting edema bilateral lower extremities  Neuro: Normal gait, moves all four extremities appropriately. Psych: Appropriate mood and affect   Significant Procedures: None  Significant Labs and Imaging:  Recent Labs  Lab 02/11/24 0235 02/12/24 0254  WBC 5.3 5.2  HGB 11.0*  10.9*  HCT 35.1* 35.6*  PLT 208 234   Recent Labs  Lab 02/10/24 1431 02/11/24 0235 02/12/24 0254  NA 134* 135 137  K 5.0 3.6 4.2  CL 99 99 98  CO2 24 25 30   GLUCOSE 132* 110* 88  BUN 18 19 16   CREATININE 0.89 0.88 0.92  CALCIUM 8.7* 8.8* 8.9  MG 2.2 2.0 1.9   Urinalysis    Component Value Date/Time   COLORURINE YELLOW 02/09/2024 1302   APPEARANCEUR CLOUDY (A) 02/09/2024 1302   LABSPEC 1.008 02/09/2024 1302   PHURINE 6.0 02/09/2024 1302   GLUCOSEU NEGATIVE 02/09/2024 1302   HGBUR LARGE (A) 02/09/2024 1302   BILIRUBINUR NEGATIVE 02/09/2024 1302   BILIRUBINUR negative 06/08/2021 1130   KETONESUR NEGATIVE 02/09/2024 1302   PROTEINUR >=300 (A) 02/09/2024 1302   UROBILINOGEN 0.2 06/08/2021 1130   UROBILINOGEN 0.2 09/20/2015 1647   NITRITE POSITIVE (A) 02/09/2024 1302   LEUKOCYTESUR LARGE (A) 02/09/2024 1302    Urine suspectibility : Susceptibility   Escherichia coli    MIC    AMPICILLIN <=2 SENSITIVE Sensitive    AMPICILLIN/SULBACTAM <=2 SENSITIVE Sensitive    CEFAZOLIN <=4 SENSITIVE Sensitive    CEFEPIME <=0.12 SENS... Sensitive    CEFTRIAXONE <=0.25 SENS... Sensitive    CIPROFLOXACIN >=4 RESISTANT Resistant    GENTAMICIN <=1 SENSITIVE Sensitive    IMIPENEM <=0.25 SENS... Sensitive    NITROFURANTOIN <=16 SENSIT... Sensitive    PIP/TAZO <=4 SENSITI... Sensitive    TRIMETH/SULFA <=20 SENSIT... Sensitive     Pertinent Imaging     Results/Tests Pending at Time of Discharge: None  Discharge Medications:  Allergies as of 02/12/2024       Reactions   Crab [shellfish Allergy]    Itching to lips   Morphine And Codeine Itching, Swelling   Atorvastatin Other (See Comments)   achiness   Rosuvastatin Other (See Comments)   Muscle pain and achiness        Medication List     TAKE these medications    acetaminophen 500 MG tablet Commonly known as: TYLENOL Take 1,000 mg by mouth daily as needed for mild pain or moderate pain.   albuterol 108 (90 Base)  MCG/ACT inhaler Commonly known as: VENTOLIN HFA Inhale 1-2 puffs into the lungs every 6 (six) hours as needed for wheezing or shortness of breath.   allopurinol 100 MG tablet Commonly known as: ZYLOPRIM Take 1 tablet (100 mg total) by mouth daily.   apixaban 5 MG Tabs tablet Commonly known as: ELIQUIS Take 1 tablet (5 mg total) by mouth 2 (two) times daily.   colchicine 0.6 MG tablet Take 1 tablet (0.6 mg total) by mouth daily. As needed for acute gout attacks What changed: additional instructions   DULoxetine 30 MG capsule Commonly known as: CYMBALTA TAKE 1 CAPSULE(30 MG) BY MOUTH DAILY   ezetimibe 10 MG tablet Commonly known as: ZETIA Take 1 tablet (10 mg total) by mouth daily.   famotidine 20 MG tablet Commonly known as: PEPCID TAKE 1 TABLET (20 MG TOTAL) BY MOUTH DAILY AS NEEDED FOR HEARTBURN OR INDIGESTION.   Farxiga 10 MG Tabs tablet Generic drug: dapagliflozin propanediol TAKE 1 TABLET BY MOUTH EVERY DAY   FeroSul 325 (65 Fe) MG tablet Generic drug: ferrous sulfate Take 1 tablet (325 mg total) by mouth every other day. What changed: when to take this   furosemide 40 MG tablet Commonly known as: LASIX Take 1  tablet (40 mg total) by mouth daily. What changed:  medication strength how much to take how to take this when to take this additional instructions   gabapentin 300 MG capsule Commonly known as: NEURONTIN Take 1 capsule (300 mg total) by mouth at bedtime as needed. TAKE 1 CAP BY MOUTH EVERYDAY AT BEDTIME.   ICY HOT BACK EX Apply 1 Application topically 2 (two) times daily as needed (lower leg pain, knee pain).   losartan 50 MG tablet Commonly known as: COZAAR TAKE 1 TABLET BY MOUTH EVERY DAY   metoprolol tartrate 50 MG tablet Commonly known as: LOPRESSOR Take 1 tablet (50 mg total) by mouth 2 (two) times daily. What changed:  medication strength how much to take   multivitamin with minerals Tabs tablet Take 1 tablet by mouth every morning.    naloxone 4 MG/0.1ML Liqd nasal spray kit Commonly known as: NARCAN SMARTSIG:Both Nares   oxyCODONE 5 MG immediate release tablet Commonly known as: Oxy IR/ROXICODONE Take 5 mg by mouth every 6 (six) hours as needed for moderate pain or severe pain.   potassium chloride 10 MEQ tablet Commonly known as: KLOR-CON Take 1 tablet (10 mEq total) by mouth daily.   Vitamin D (Ergocalciferol) 1.25 MG (50000 UNIT) Caps capsule Commonly known as: DRISDOL Take 50,000 Units by mouth once a week. Take on Wednesday        Discharge Instructions: Please refer to Patient Instructions section of EMR for full details.  Patient was counseled important signs and symptoms that should prompt return to medical care, changes in medications, dietary instructions, activity restrictions, and follow up appointments.   Follow-Up Appointments:  Follow-up Information     Marlyse Single PA-C Follow up.   Why: **IMPORTANT: Please disregard address listed below for Marlyse Single PA-C - THIS APPOINTMENT IS AT [address] in [place]**  You have a follow-up appointment with a provider at [facility] in [place] on Tuesday February 27, 2024 2:45 PM (Arrive by 2:30 PM).  We are currently in the process of transitioning from two locations to one.  Effective February 26, 2024, all appointments that were previously scheduled at either our [facility] or [facility] locations will be moved to our new location at [address] [place], [place], [address].  The phone number for our new location will be [phone]. Contact informationJosie Night [phone]                Derril Flint Hong Timm, MD 02/12/2024, 1:53 PM PGY-1, [facility] Family Medicine

## 2024-02-13 ENCOUNTER — Telehealth (HOSPITAL_COMMUNITY): Payer: Self-pay

## 2024-02-13 NOTE — Telephone Encounter (Signed)
 Call placed to patient to discuss upcoming procedure.   Confirmed patient is scheduled for a Biventricular permanent transvenous pacemaker on Monday, April 21 with Dr. Harvie Liner. Instructed patient to arrive at the Main Entrance A at [facility]: [address] [place], [place] [address] and check in at Admitting at 12:30 PM.   Labs completed  Any recent signs of acute illness or been started on antibiotics? Hospital admission from 4/11-4/14/25. Patient denies any UTI symptoms and has completed course of antibiotics prior to discharge. OK to proceed with procedure per Dr. Arlester Ladd.  Any medications to hold? Hold Farxiga and Eliqus 3 days prior and Furosemide AM of procedure. Medication instructions:  On the morning of your procedure take all other medications not listed with small sips of water.  No eating or drinking after midnight prior to procedure.   The night before your procedure and the morning of your procedure, wash thoroughly with the CHG surgical soap from the neck down, paying special attention to the area where your surgery will be performed.  Advised of plan to go home the same day and will only stay overnight if medically necessary. You MUST have a responsible adult to drive you home and MUST be with you the first 24 hours after you arrive home.  Patient verbalized understanding to all instructions provided and agreed to proceed with procedure.

## 2024-02-15 ENCOUNTER — Inpatient Hospital Stay: Payer: Self-pay | Admitting: Student

## 2024-02-16 NOTE — Pre-Procedure Instructions (Signed)
 Instructed patient on the following items: Arrival time 1200 Nothing to eat or drink after midnight No meds AM of procedure Responsible person to drive you home and stay with you for 24 hrs Wash with special soap night before and morning of procedure If on anti-coagulant drug instructions Eliquis - last dose 4/17

## 2024-02-19 ENCOUNTER — Encounter (HOSPITAL_COMMUNITY): Payer: Self-pay | Admitting: Cardiology

## 2024-02-19 ENCOUNTER — Other Ambulatory Visit: Payer: Self-pay

## 2024-02-19 ENCOUNTER — Ambulatory Visit (HOSPITAL_COMMUNITY)
Admission: RE | Admit: 2024-02-19 | Discharge: 2024-02-20 | Disposition: A | Discharge status: Home / Self Care | Attending: Cardiology | Admitting: Cardiology

## 2024-02-19 ENCOUNTER — Encounter (HOSPITAL_COMMUNITY)
Admission: RE | Disposition: A | Discharge status: Home / Self Care | Payer: Self-pay | Source: Home / Self Care | Attending: Cardiology

## 2024-02-19 DIAGNOSIS — Z720 Tobacco use: Secondary | ICD-10-CM | POA: Insufficient documentation

## 2024-02-19 DIAGNOSIS — R0600 Dyspnea, unspecified: Secondary | ICD-10-CM | POA: Insufficient documentation

## 2024-02-19 DIAGNOSIS — R7303 Prediabetes: Secondary | ICD-10-CM | POA: Diagnosis not present

## 2024-02-19 DIAGNOSIS — I4821 Permanent atrial fibrillation: Secondary | ICD-10-CM | POA: Insufficient documentation

## 2024-02-19 DIAGNOSIS — J849 Interstitial pulmonary disease, unspecified: Secondary | ICD-10-CM | POA: Insufficient documentation

## 2024-02-19 DIAGNOSIS — R2689 Other abnormalities of gait and mobility: Secondary | ICD-10-CM | POA: Diagnosis not present

## 2024-02-19 DIAGNOSIS — I700 Atherosclerosis of aorta: Secondary | ICD-10-CM | POA: Insufficient documentation

## 2024-02-19 DIAGNOSIS — I428 Other cardiomyopathies: Secondary | ICD-10-CM | POA: Diagnosis not present

## 2024-02-19 DIAGNOSIS — I110 Hypertensive heart disease with heart failure: Secondary | ICD-10-CM | POA: Diagnosis not present

## 2024-02-19 DIAGNOSIS — K219 Gastro-esophageal reflux disease without esophagitis: Secondary | ICD-10-CM | POA: Diagnosis not present

## 2024-02-19 DIAGNOSIS — Z79899 Other long term (current) drug therapy: Secondary | ICD-10-CM | POA: Insufficient documentation

## 2024-02-19 DIAGNOSIS — M6281 Muscle weakness (generalized): Secondary | ICD-10-CM | POA: Diagnosis not present

## 2024-02-19 DIAGNOSIS — I5022 Chronic systolic (congestive) heart failure: Secondary | ICD-10-CM | POA: Diagnosis not present

## 2024-02-19 DIAGNOSIS — E785 Hyperlipidemia, unspecified: Secondary | ICD-10-CM | POA: Diagnosis not present

## 2024-02-19 DIAGNOSIS — Z7901 Long term (current) use of anticoagulants: Secondary | ICD-10-CM | POA: Diagnosis not present

## 2024-02-19 DIAGNOSIS — Z950 Presence of cardiac pacemaker: Secondary | ICD-10-CM | POA: Diagnosis present

## 2024-02-19 HISTORY — PX: BIV PACEMAKER INSERTION CRT-P: EP1199

## 2024-02-19 HISTORY — PX: LEAD INSERTION: EP1212

## 2024-02-19 SURGERY — BIV PACEMAKER INSERTION CRT-P

## 2024-02-19 MED ORDER — LIDOCAINE HCL (PF) 1 % IJ SOLN
INTRAMUSCULAR | Status: AC
  Filled 2024-02-19: qty 60

## 2024-02-19 MED ORDER — MIDAZOLAM HCL 5 MG/5ML IJ SOLN
INTRAMUSCULAR | Status: DC | PRN
  Administered 2024-02-19 (×3): .5 mg via INTRAVENOUS

## 2024-02-19 MED ORDER — OXYCODONE HCL 5 MG PO TABS
ORAL_TABLET | ORAL | Status: AC
  Administered 2024-02-19: 5 mg via ORAL
  Filled 2024-02-19: qty 1

## 2024-02-19 MED ORDER — POVIDONE-IODINE 10 % EX SWAB
2.0000 | Freq: Once | CUTANEOUS | Status: AC
  Administered 2024-02-19: 2 via TOPICAL

## 2024-02-19 MED ORDER — METOPROLOL TARTRATE 50 MG PO TABS
50.0000 mg | ORAL_TABLET | Freq: Two times a day (BID) | ORAL | Status: DC
  Administered 2024-02-19 – 2024-02-20 (×2): 50 mg via ORAL
  Filled 2024-02-19 (×2): qty 1

## 2024-02-19 MED ORDER — IOHEXOL 350 MG/ML SOLN
INTRAVENOUS | Status: DC | PRN
  Administered 2024-02-19 (×2): 6 mL

## 2024-02-19 MED ORDER — ONDANSETRON HCL 4 MG/2ML IJ SOLN: 4.0000 mg | Freq: Four times a day (QID) | INTRAMUSCULAR | Status: DC | PRN

## 2024-02-19 MED ORDER — HEPARIN (PORCINE) IN NACL 1000-0.9 UT/500ML-% IV SOLN
INTRAVENOUS | Status: DC | PRN
  Administered 2024-02-19: 500 mL

## 2024-02-19 MED ORDER — LOSARTAN POTASSIUM 50 MG PO TABS
50.0000 mg | ORAL_TABLET | Freq: Every day | ORAL | Status: DC
  Administered 2024-02-20: 50 mg via ORAL
  Filled 2024-02-19: qty 1

## 2024-02-19 MED ORDER — CEFAZOLIN SODIUM-DEXTROSE 2-4 GM/100ML-% IV SOLN: 2.0000 g | INTRAVENOUS | Status: AC

## 2024-02-19 MED ORDER — LIDOCAINE HCL (PF) 1 % IJ SOLN
INTRAMUSCULAR | Status: DC | PRN
  Administered 2024-02-19: 50 mL

## 2024-02-19 MED ORDER — SODIUM CHLORIDE 0.9 % IV SOLN
INTRAVENOUS | Status: AC
  Administered 2024-02-19: 80 mg
  Filled 2024-02-19: qty 2

## 2024-02-19 MED ORDER — CEFAZOLIN SODIUM-DEXTROSE 2-4 GM/100ML-% IV SOLN
INTRAVENOUS | Status: AC
  Administered 2024-02-19: 2 g via INTRAVENOUS
  Filled 2024-02-19: qty 100

## 2024-02-19 MED ORDER — EZETIMIBE 10 MG PO TABS
10.0000 mg | ORAL_TABLET | Freq: Every day | ORAL | Status: DC
  Administered 2024-02-20: 10 mg via ORAL
  Filled 2024-02-19: qty 1

## 2024-02-19 MED ORDER — ACETAMINOPHEN 325 MG PO TABS: 325.0000 mg | ORAL_TABLET | ORAL | Status: DC | PRN

## 2024-02-19 MED ORDER — ALLOPURINOL 100 MG PO TABS
100.0000 mg | ORAL_TABLET | Freq: Every day | ORAL | Status: DC
  Administered 2024-02-20: 100 mg via ORAL
  Filled 2024-02-19: qty 1

## 2024-02-19 MED ORDER — DULOXETINE HCL 30 MG PO CPEP
30.0000 mg | ORAL_CAPSULE | Freq: Every day | ORAL | Status: DC
  Administered 2024-02-20: 30 mg via ORAL
  Filled 2024-02-19: qty 1

## 2024-02-19 MED ORDER — FUROSEMIDE 40 MG PO TABS
40.0000 mg | ORAL_TABLET | Freq: Every day | ORAL | Status: DC
  Administered 2024-02-20: 40 mg via ORAL
  Filled 2024-02-19: qty 1

## 2024-02-19 MED ORDER — SODIUM CHLORIDE 0.9 % IV SOLN: INTRAVENOUS | Status: DC

## 2024-02-19 MED ORDER — CHLORHEXIDINE GLUCONATE 4 % EX SOLN
4.0000 | Freq: Once | CUTANEOUS | Status: DC
  Filled 2024-02-19: qty 60

## 2024-02-19 MED ORDER — FENTANYL CITRATE (PF) 100 MCG/2ML IJ SOLN
INTRAMUSCULAR | Status: DC | PRN
  Administered 2024-02-19 (×2): 12.5 ug via INTRAVENOUS

## 2024-02-19 MED ORDER — SODIUM CHLORIDE 0.9 % IV SOLN: 80.0000 mg | INTRAVENOUS | Status: AC

## 2024-02-19 MED ORDER — MIDAZOLAM HCL 5 MG/5ML IJ SOLN
INTRAMUSCULAR | Status: AC
  Filled 2024-02-19: qty 5

## 2024-02-19 MED ORDER — ALBUTEROL SULFATE HFA 108 (90 BASE) MCG/ACT IN AERS
1.0000 | INHALATION_SPRAY | Freq: Four times a day (QID) | RESPIRATORY_TRACT | Status: DC | PRN
Start: 2024-02-19 — End: 2024-02-20

## 2024-02-19 MED ORDER — DAPAGLIFLOZIN PROPANEDIOL 10 MG PO TABS
10.0000 mg | ORAL_TABLET | Freq: Every day | ORAL | Status: DC
  Administered 2024-02-20: 10 mg via ORAL
  Filled 2024-02-19: qty 1

## 2024-02-19 MED ORDER — FENTANYL CITRATE (PF) 100 MCG/2ML IJ SOLN
INTRAMUSCULAR | Status: AC
  Filled 2024-02-19: qty 2

## 2024-02-19 MED ORDER — POTASSIUM CHLORIDE CRYS ER 10 MEQ PO TBCR
10.0000 meq | EXTENDED_RELEASE_TABLET | Freq: Every day | ORAL | Status: DC
  Administered 2024-02-20: 10 meq via ORAL
  Filled 2024-02-19 (×2): qty 1

## 2024-02-19 MED ORDER — OXYCODONE HCL 5 MG PO TABS
5.0000 mg | ORAL_TABLET | Freq: Four times a day (QID) | ORAL | Status: DC | PRN
  Administered 2024-02-19 – 2024-02-20 (×3): 5 mg via ORAL
  Filled 2024-02-19 (×2): qty 1

## 2024-02-19 SURGICAL SUPPLY — 23 items
BALLOON COR SINUS VENO 6FR 80 (BALLOONS) IMPLANT
CABLE SURGICAL S-101-97-12 (CABLE) ×1 IMPLANT
CATH ATTAIN COM SURV 6250V-MB2 (CATHETERS) IMPLANT
CATH ATTAIN SEL SURV 6248V-130 (CATHETERS) IMPLANT
CATH CPS DIRECT 135 DS2C020 (CATHETERS) IMPLANT
CATH CPS LOCATOR 3D MED (CATHETERS) IMPLANT
CATH CPS LOCATOR 3D MED XLNG (CATHETERS) IMPLANT
LEAD QUARTET 1458Q-86CM (Lead) IMPLANT
LEAD ULTIPACE 52 LPA1231/52 (Lead) IMPLANT
LEAD ULTIPACE 65 LPA1231/65 (Lead) IMPLANT
MAT PREVALON FULL STRYKER (MISCELLANEOUS) IMPLANT
PACEMAKER QUDR ALLR CRT PM3562 (Pacemaker) IMPLANT
PAD DEFIB RADIO PHYSIO CONN (PAD) ×1 IMPLANT
SHEATH 7FR PRELUDE SNAP 13 (SHEATH) IMPLANT
SHEATH 9.5FR PRELUDE SNAP 13 (SHEATH) IMPLANT
SHEATH 9FR PRELUDE SNAP 13 (SHEATH) IMPLANT
SHEATH PROBE COVER 6X72 (BAG) IMPLANT
SLITTER 6232ADJ (MISCELLANEOUS) IMPLANT
SLITTER AGILIS HISPRO (INSTRUMENTS) IMPLANT
TOOL HELIX LOCKING (MISCELLANEOUS) IMPLANT
TRAY PACEMAKER INSERTION (PACKS) ×1 IMPLANT
WIRE ACUITY WHISPER EDS 4648 (WIRE) IMPLANT
WIRE HI TORQ VERSACORE-J 145CM (WIRE) IMPLANT

## 2024-02-19 NOTE — Progress Notes (Signed)
 Pt arrived to 3e27 via stretcher from cath lab. Received report from [place], Charity fundraiser. See assessment.

## 2024-02-19 NOTE — Plan of Care (Signed)
  Problem: Pain Managment: Goal: General experience of comfort will improve and/or be controlled Outcome: Progressing   Problem: Safety: Goal: Ability to remain free from injury will improve Outcome: Progressing   Problem: Skin Integrity: Goal: Risk for impaired skin integrity will decrease Outcome: Progressing

## 2024-02-19 NOTE — Interval H&P Note (Signed)
 History and Physical Interval Note:  02/19/2024 12:30 PM  Donna Herrera  has presented today for surgery, with the diagnosis of afib.  The various methods of treatment have been discussed with the patient and family. After consideration of risks, benefits and other options for treatment, the patient has consented to  Procedure(s): BIV PACEMAKER INSERTION CRT-P (N/A) LEAD INSERTION (N/A) as a surgical intervention.  The patient's history has been reviewed, patient examined, no change in status, stable for surgery.  I have reviewed the patient's chart and labs.  Questions were answered to the patient's satisfaction.     [name] T Shadi Sessler

## 2024-02-20 ENCOUNTER — Encounter (HOSPITAL_COMMUNITY): Payer: Self-pay | Admitting: Cardiology

## 2024-02-20 ENCOUNTER — Ambulatory Visit (HOSPITAL_COMMUNITY)

## 2024-02-20 DIAGNOSIS — I4821 Permanent atrial fibrillation: Secondary | ICD-10-CM | POA: Diagnosis not present

## 2024-02-20 DIAGNOSIS — E785 Hyperlipidemia, unspecified: Secondary | ICD-10-CM | POA: Diagnosis not present

## 2024-02-20 DIAGNOSIS — I5022 Chronic systolic (congestive) heart failure: Secondary | ICD-10-CM | POA: Diagnosis not present

## 2024-02-20 DIAGNOSIS — I110 Hypertensive heart disease with heart failure: Secondary | ICD-10-CM | POA: Diagnosis not present

## 2024-02-20 DIAGNOSIS — Z950 Presence of cardiac pacemaker: Secondary | ICD-10-CM

## 2024-02-20 MED ORDER — POLYETHYLENE GLYCOL 3350 17 G PO PACK: 17.0000 g | PACK | Freq: Once | ORAL | Status: DC

## 2024-02-20 NOTE — Evaluation (Signed)
 Physical Therapy Evaluation Patient Details Name: Donna Herrera MRN: [id number] DOB: 04/09/1946 Today's Date: 02/20/2024  History of Present Illness  78 y.o. female presents to [facility] hospital on 02/19/2024 for evaluation of afib with associated systolic heart failure. Pt underwent CRT-P implant on 4/21. PMH includes systolic heart failure, afib, emphysema, HTN, HLD, pulmonary fibrosis.  Clinical Impression  Pt presents to PT with deficits in strength, power, gait, balance, ROM, however many of these appear to be chronic from prior surgeries. Pt requires physical assistance for bed mobility, typical for her at baseline, and is able to transfer from elevated surfaces. Pt declines transfer attempts from lower seats, but it appears she may only transfer from taller surfaces at home at baseline. Pt ambulates well with support of RW, bearing minimal weight through LUE. PT recommends discharge home with HHPT and a RW. Pt reports good support from family at the time of discharge.        If plan is discharge home, recommend the following: A little help with walking and/or transfers;A lot of help with bathing/dressing/bathroom;Assistance with cooking/housework;Assist for transportation;Help with stairs or ramp for entrance   Can travel by private vehicle        Equipment Recommendations Rolling walker (2 wheels)  Recommendations for Other Services       Functional Status Assessment Patient has had a recent decline in their functional status and demonstrates the ability to make significant improvements in function in a reasonable and predictable amount of time.     Precautions / Restrictions Precautions Precautions: Fall;ICD/Pacemaker Recall of Precautions/Restrictions: Intact Precaution/Restrictions Comments: verbal review of pacemaker precautions Restrictions Weight Bearing Restrictions Per Provider Order: Yes (limit weightbearing through LUE due to pacemaker precautions)       Mobility  Bed Mobility Overal bed mobility: Needs Assistance Bed Mobility: Supine to Sit, Sit to Supine     Supine to sit: Min assist, HOB elevated Sit to supine: Min assist   General bed mobility comments: pt pulls into sitting with RUE, PT assists with BLE when returning to supine (typically how the pt performs mobility at home)    Transfers Overall transfer level: Needs assistance Equipment used: Rolling walker (2 wheels) Transfers: Sit to/from Stand Sit to Stand: Contact guard assist, From elevated surface           General transfer comment: significantly elevated bed height, pt almost in standing when feet touch the floor from bedside. Pt declines attempting transfers from lower surfaces    Ambulation/Gait Ambulation/Gait assistance: Contact guard assist Gait Distance (Feet): 40 Feet Assistive device: Rolling walker (2 wheels) Gait Pattern/deviations: Step-to pattern Gait velocity: reduced Gait velocity interpretation: <1.31 ft/sec, indicative of household ambulator   General Gait Details: slowed step-to gait, reduced stance time on LLE  Stairs            Wheelchair Mobility     Tilt Bed    Modified Rankin (Stroke Patients Only)       Balance Overall balance assessment: Needs assistance Sitting-balance support: No upper extremity supported, Feet supported Sitting balance-Leahy Scale: Fair     Standing balance support: Bilateral upper extremity supported, Reliant on assistive device for balance Standing balance-Leahy Scale: Poor                               Pertinent Vitals/Pain Pain Assessment Pain Assessment: Faces Faces Pain Scale: Hurts even more Pain Location: small superficial skin tear on chest, RN made aware Pain  Descriptors / Indicators: Sore Pain Intervention(s): Monitored during session    Home Living Family/patient expects to be discharged to:: Private residence Living Arrangements: Children;Other  relatives Available Help at Discharge: Family;Available PRN/intermittently;Friend(s) (granddaughters will be primary caregivers) Type of Home: House Home Access: Stairs to enter;Ramped entrance Entrance Stairs-Rails: Right Entrance Stairs-Number of Steps: 4   Home Layout: One level Home Equipment: Grab bars - tub/shower;Grab bars - toilet;Crutches;Rollator (4 wheels);Cane - single point;BSC/3in1;Hospital bed      Prior Function Prior Level of Function : Needs assist             Mobility Comments: typically ambulatory with crutches, stands from elevated bed, assistance for bed mobility ADLs Comments: Pt reports sometimes sitting on the [employer] when bathing and that it takes increased time for her to clean herself. Granddaughter will assist with baths. Granddaughter or sister-in-law drives her to appointments. Gets groceries delivered and has food delievered by senior care.     Extremity/Trunk Assessment   Upper Extremity Assessment Upper Extremity Assessment: Overall WFL for tasks assessed (L shoulder ROM not formally assessed due to PPM precautions, no resistive strength assessment performed to LUE)    Lower Extremity Assessment Lower Extremity Assessment: Generalized weakness (pt with chronic weakness from multiple joint replacements with poor outcomes)    Cervical / Trunk Assessment Cervical / Trunk Assessment: Kyphotic  Communication   Communication Communication: No apparent difficulties    Cognition Arousal: Alert Behavior During Therapy: WFL for tasks assessed/performed   PT - Cognitive impairments: No apparent impairments                         Following commands: Intact       Cueing Cueing Techniques: Verbal cues     General Comments General comments (skin integrity, edema, etc.): VSS on RA    Exercises     Assessment/Plan    PT Assessment Patient needs continued PT services  PT Problem List Decreased strength;Decreased range of  motion;Decreased activity tolerance;Decreased balance;Decreased mobility;Decreased knowledge of use of DME;Decreased knowledge of precautions;Pain       PT Treatment Interventions DME instruction;Gait training;Stair training;Functional mobility training;Balance training;Therapeutic activities;Therapeutic exercise;Neuromuscular re-education;Patient/family education    PT Goals (Current goals can be found in the Care Plan section)  Acute Rehab PT Goals Patient Stated Goal: to return home PT Goal Formulation: With patient Time For Goal Achievement: 03/05/24 Potential to Achieve Goals: Good    Frequency Min 2X/week     Co-evaluation               AM-PAC PT "6 Clicks" Mobility  Outcome Measure Help needed turning from your back to your side while in a flat bed without using bedrails?: A Little Help needed moving from lying on your back to sitting on the side of a flat bed without using bedrails?: A Little Help needed moving to and from a bed to a chair (including a wheelchair)?: A Little Help needed standing up from a chair using your arms (e.g., wheelchair or bedside chair)?: A Little Help needed to walk in hospital room?: A Little Help needed climbing 3-5 steps with a railing? : A Lot 6 Click Score: 17    End of Session Equipment Utilized During Treatment: Gait belt Activity Tolerance: Patient tolerated treatment well Patient left: in bed;with call bell/phone within reach;with bed alarm set Nurse Communication: Mobility status PT Visit Diagnosis: Other abnormalities of gait and mobility (R26.89);Muscle weakness (generalized) (M62.81);Difficulty in walking, not elsewhere classified (R26.2)  Time: 1030-1056 PT Time Calculation (min) (ACUTE ONLY): 26 min   Charges:   PT Evaluation $PT Eval Low Complexity: 1 Low   PT General Charges $$ ACUTE PT VISIT: 1 Visit         Rexie Catena, PT, DPT Acute Rehabilitation Office [phone]   Rexie Catena 02/20/2024, 11:39  AM

## 2024-02-20 NOTE — Progress Notes (Signed)
 Physical Therapy Quick Note  PT has completed initial evaluation.    Overall, patient at min assistance level.   PT Follow up recommended: Home Health PT Equipment recommended:  Rolling walker Complete evaluation note to follow.     Arlyss Gandy, PT, DPT Acute Rehabilitation Office [phone]

## 2024-02-20 NOTE — TOC CM/SW Note (Addendum)
 Transition of Care [facility]) - Inpatient Brief Assessment   Patient Details  Name: Donna Herrera MRN: [id number] Date of Birth: 15-Jan-1946  Transition of Care [facility]) CM/SW Contact:    Jennett Model, RN Phone Number: 02/20/2024, 12:46 PM   Clinical Narrative: From home with daughter and SIL,, has PCP and insurance on file, states has  [facility] services in place with [facility] and would like to continue.  NCM called Randel Buss to confirm, awaiting call back, she has no preference of the  DME agency for her rolling walker.  NCM made referral with Jermaine at [facility].    States family member will transport them home at [employer] and family is support system, states gets medications from [facility] .  Pta self ambulatory , will need walker now.  Per Randel Buss with [facility] she has HHRN , they will also add HHPT to services.  Soc will begin 24 to 48 hrs post dc.   Transition of Care Asessment: Insurance and Status: Insurance coverage has been reviewed Patient has primary care physician: Yes Home environment has been reviewed: lives with daughter and SIL Prior level of function:: indep with walker Prior/Current Home Services: No current home services Social Drivers of Health Review: SDOH reviewed no interventions necessary Readmission risk has been reviewed: Yes Transition of care needs: transition of care needs identified, TOC will continue to follow

## 2024-02-20 NOTE — Plan of Care (Signed)
 Problem: Education: Goal: Knowledge of cardiac device and self-care will improve 02/20/2024 1317 by Valetta Gaudy, RN Outcome: Adequate for Discharge 02/20/2024 1316 by Valetta Gaudy, RN Outcome: Adequate for Discharge Goal: Ability to safely manage health related needs after discharge will improve 02/20/2024 1317 by Valetta Gaudy, RN Outcome: Adequate for Discharge 02/20/2024 1316 by Valetta Gaudy, RN Outcome: Adequate for Discharge Goal: Individualized Educational Video(s) 02/20/2024 1317 by Valetta Gaudy, RN Outcome: Adequate for Discharge 02/20/2024 1316 by Valetta Gaudy, RN Outcome: Adequate for Discharge   Problem: Cardiac: Goal: Ability to achieve and maintain adequate cardiopulmonary perfusion will improve 02/20/2024 1317 by Valetta Gaudy, RN Outcome: Adequate for Discharge 02/20/2024 1316 by Valetta Gaudy, RN Outcome: Adequate for Discharge   Problem: Education: Goal: Knowledge of General Education information will improve Description: Including pain rating scale, medication(s)/side effects and non-pharmacologic comfort measures 02/20/2024 1317 by Valetta Gaudy, RN Outcome: Adequate for Discharge 02/20/2024 1316 by Valetta Gaudy, RN Outcome: Adequate for Discharge   Problem: Health Behavior/Discharge Planning: Goal: Ability to manage health-related needs will improve 02/20/2024 1317 by Valetta Gaudy, RN Outcome: Adequate for Discharge 02/20/2024 1316 by Valetta Gaudy, RN Outcome: Adequate for Discharge   Problem: Clinical Measurements: Goal: Ability to maintain clinical measurements within normal limits will improve 02/20/2024 1317 by Valetta Gaudy, RN Outcome: Adequate for Discharge 02/20/2024 1316 by Valetta Gaudy, RN Outcome: Adequate for Discharge Goal: Will remain free from infection 02/20/2024 1317 by Valetta Gaudy, RN Outcome: Adequate for Discharge 02/20/2024 1316 by Valetta Gaudy, RN Outcome: Adequate for Discharge Goal: Diagnostic test results  will improve 02/20/2024 1317 by Valetta Gaudy, RN Outcome: Adequate for Discharge 02/20/2024 1316 by Valetta Gaudy, RN Outcome: Adequate for Discharge Goal: Respiratory complications will improve 02/20/2024 1317 by Valetta Gaudy, RN Outcome: Adequate for Discharge 02/20/2024 1316 by Valetta Gaudy, RN Outcome: Adequate for Discharge Goal: Cardiovascular complication will be avoided 02/20/2024 1317 by Valetta Gaudy, RN Outcome: Adequate for Discharge 02/20/2024 1316 by Valetta Gaudy, RN Outcome: Adequate for Discharge   Problem: Activity: Goal: Risk for activity intolerance will decrease 02/20/2024 1317 by Valetta Gaudy, RN Outcome: Adequate for Discharge 02/20/2024 1316 by Valetta Gaudy, RN Outcome: Adequate for Discharge   Problem: Nutrition: Goal: Adequate nutrition will be maintained 02/20/2024 1317 by Valetta Gaudy, RN Outcome: Adequate for Discharge 02/20/2024 1316 by Valetta Gaudy, RN Outcome: Adequate for Discharge   Problem: Coping: Goal: Level of anxiety will decrease 02/20/2024 1317 by Valetta Gaudy, RN Outcome: Adequate for Discharge 02/20/2024 1316 by Valetta Gaudy, RN Outcome: Adequate for Discharge   Problem: Elimination: Goal: Will not experience complications related to bowel motility 02/20/2024 1317 by Valetta Gaudy, RN Outcome: Adequate for Discharge 02/20/2024 1316 by Valetta Gaudy, RN Outcome: Adequate for Discharge Goal: Will not experience complications related to urinary retention 02/20/2024 1317 by Valetta Gaudy, RN Outcome: Adequate for Discharge 02/20/2024 1316 by Valetta Gaudy, RN Outcome: Adequate for Discharge   Problem: Pain Managment: Goal: General experience of comfort will improve and/or be controlled 02/20/2024 1317 by Valetta Gaudy, RN Outcome: Adequate for Discharge 02/20/2024 1316 by Valetta Gaudy, RN Outcome: Adequate for Discharge   Problem: Safety: Goal: Ability to remain free from injury will improve 02/20/2024 1317 by  Valetta Gaudy, RN Outcome: Adequate for Discharge 02/20/2024 1316 by Valetta Gaudy, RN Outcome: Adequate for Discharge   Problem:  Skin Integrity: Goal: Risk for impaired skin integrity will decrease 02/20/2024 1317 by Valetta Gaudy, RN Outcome: Adequate for Discharge 02/20/2024 1316 by Valetta Gaudy, RN Outcome: Adequate for Discharge   Problem: Acute Rehab PT Goals(only PT should resolve) Goal: Pt Will Go Supine/Side To Sit Outcome: Adequate for Discharge Goal: Pt Will Go Sit To Supine/Side Outcome: Adequate for Discharge Goal: Patient Will Transfer Sit To/From Stand Outcome: Adequate for Discharge Goal: Pt Will Transfer Bed To Chair/Chair To Bed Outcome: Adequate for Discharge Goal: Pt Will Ambulate Outcome: Adequate for Discharge Goal: Pt Will Verbalize and Adhere to Precautions While Description: PT Will Verbalize and Adhere to Precautions While Performing Mobility Outcome: Adequate for Discharge

## 2024-02-20 NOTE — Progress Notes (Signed)
 Discharge instructions (including medications) discussed with and copy provided to patient/caregiver

## 2024-02-20 NOTE — Plan of Care (Signed)
   Problem: Education: Goal: Knowledge of General Education information will improve Description Including pain rating scale, medication(s)/side effects and non-pharmacologic comfort measures Outcome: Progressing

## 2024-02-20 NOTE — Discharge Instructions (Addendum)
 After Your Pacemaker   You have a Abbott Pacemaker  ACTIVITY Do not lift your arm above shoulder height for 1 week after your procedure. After 7 days, you may progress as below.  You should remove your sling 24 hours after your procedure, unless otherwise instructed by your provider.     Tuesday February 27, 2024  Wednesday February 28, 2024 Thursday Feb 29, 2024 Friday Mar 01, 2024   Do not lift, push, pull, or carry anything over 10 pounds with the affected arm until 6 weeks (Tuesday April 02, 2024 ) after your procedure.   You may drive AFTER your wound check, unless you have been told otherwise by your provider.   Ask your healthcare provider when you can go back to work   INCISION/Dressing Resume Eliquis  with AM dose on 02/25/24  If large square, outer bandage is left in place, this can be removed after 24 hours from your procedure. Do not remove steri-strips or glue as below.   If a PRESSURE DRESSING (a bulky dressing that usually goes up over your shoulder) was applied or left in place, please follow instructions given by your provider on when to return to have this removed.   Monitor your Pacemaker site for redness, swelling, and drainage. Call the device clinic at [phone] if you experience these symptoms or fever/chills.  If your incision is sealed with Steri-strips or staples, you may shower 7 days after your procedure or when told by your provider. Do not remove the steri-strips or let the shower hit directly on your site. You may wash around your site with soap and water.    If you were discharged in a sling, please do not wear this during the day more than 48 hours after your surgery unless otherwise instructed. This may increase the risk of stiffness and soreness in your shoulder.   Avoid lotions, ointments, or perfumes over your incision until it is well-healed.  You may use a hot tub or a pool AFTER your wound check appointment if the incision is completely  closed.  Pacemaker Alerts:  Some alerts are vibratory and others beep. These are NOT emergencies. Please call our office to let us  know. If this occurs at night or on weekends, it can wait until the next business day. Send a remote transmission.  If your device is capable of reading fluid status (for heart failure), you will be offered monthly monitoring to review this with you.   DEVICE MANAGEMENT Remote monitoring is used to monitor your pacemaker from home. This monitoring is scheduled every 91 days by our office. It allows us  to keep an eye on the functioning of your device to ensure it is working properly. You will routinely see your Electrophysiologist annually (more often if necessary).   You should receive your ID card for your new device in 4-8 weeks. Keep this card with you at all times once received. Consider wearing a medical alert bracelet or necklace.  Your Pacemaker may be MRI compatible. This will be discussed at your next office visit/wound check.  You should avoid contact with strong electric or magnetic fields.   Do not use amateur (ham) radio equipment or electric (arc) welding torches. MP3 player headphones with magnets should not be used. Some devices are safe to use if held at least 12 inches (30 cm) from your Pacemaker. These include power tools, lawn mowers, and speakers. If you are unsure if something is safe to use, ask your health care provider.  When using your cell phone, hold it to the ear that is on the opposite side from the Pacemaker. Do not leave your cell phone in a pocket over the Pacemaker.  You may safely use electric blankets, heating pads, computers, and microwave ovens.  Call the office right away if: You have chest pain. You feel more short of breath than you have felt before. You feel more light-headed than you have felt before. Your incision starts to open up.  This information is not intended to replace advice given to you by your health care  provider. Make sure you discuss any questions you have with your health care provider.

## 2024-02-20 NOTE — Discharge Summary (Signed)
 ELECTROPHYSIOLOGY PROCEDURE DISCHARGE SUMMARY    Patient ID: Donna Herrera,  MRN: [id number], DOB/AGE: [birth year] 78 y.o.  Admit date: 02/19/2024 Discharge date: 02/20/2024  Primary Care Physician: Farris Hong, MD  Primary Cardiologist: Ola Berger, MD  Electrophysiologist: Dr. Marven Slimmer   Primary Discharge Diagnosis:  HFrEF, permanent Atrial Fibrillation (though presented in SR) status post pacemaker implantation this admission  Secondary Discharge Diagnosis:   HTN Hyperlipidemia  Pre-Diabetes  Dyspnea  Allergies  Allergen Reactions   Crab [Shellfish Allergy]     Itching to lips   Morphine And Codeine Itching and Swelling   Atorvastatin  Other (See Comments)    achiness   Rosuvastatin  Other (See Comments)    Muscle pain and achiness     Procedures This Admission:  1.  Implantation of a Abbott CRT PPM on 02/19/24 by Dr. Marven Slimmer. The patient received a Abbott Quadra Allure MP [id number]  with a Abbott Ultipace 1231-52 right atrial lead, Abbott Ultipace 1231-65 right ventricular lead, and a Abbott Quartet 1458Q left ventricular lead.  There were no immediate post procedure complications.   2.  CXR on 02/20/2024 demonstrated no pneumothorax status post device implantation.       Brief HPI: Donna Herrera is a 78 y.o. female was referred to electrophysiology in the outpatient setting for consideration of PPM implantation.  Past medical history includes chronic systolic heart failure / NICM (LVEF <20%, EKG with narrow QRS), permanent atrial fibrillation, HTN, HLD, chronic pain, depression, GERD, tobacco abuse.  The patient has had  HFrEF and permanent AF  without reversible causes identified.  Risks, benefits, and alternatives to PPM implantation were reviewed with the patient who wished to proceed.   Hospital Course:  The patient was admitted and underwent implantation of a Abbott CRT-P as staging for AV node ablation with details as outlined above.  She  was monitored on telemetry overnight which demonstrated appropriate pacing.  Left chest was without hematoma or ecchymosis.  The device was interrogated and found to be functioning normally.  CXR was obtained and demonstrated no pneumothorax status post device implantation.  Wound care, arm mobility, and restrictions were reviewed with the patient.  The patient was examined and considered stable for discharge to home.    Anticoagulation resumption This patient should resume their Eliquis  on  am of 02/25/24     Physical Exam: Vitals:   02/20/24 0303 02/20/24 0542 02/20/24 0733 02/20/24 1127  BP: 109/76  112/61 103/77  Pulse: 75  82 77  Resp: 14  (!) 21 18  Temp: 98.5 F (36.9 C)  98.5 F (36.9 C) 97.9 F (36.6 C)  TempSrc: Oral  Oral Oral  SpO2: 98%  95% 96%  Weight:  70.3 kg    Height:        GEN- NAD. A&O x 3.  HEENT: Normocephalic, atraumatic Lungs- CTAB, Normal effort.  Heart- RRR, No M/G/R.  GI- Soft, NT, ND.  Extremities- No clubbing, cyanosis, or edema;  Skin- warm and dry, no rash or lesion, left chest without hematoma/ecchymosis, steri strips intact, fine linear superfical skin tear on left neck, approx 1 cm in length with removal of tegaderm  Discharge Medications:  Allergies as of 02/20/2024       Reactions   Crab [shellfish Allergy]    Itching to lips   Morphine And Codeine Itching, Swelling   Atorvastatin  Other (See Comments)   achiness   Rosuvastatin  Other (See Comments)   Muscle pain and achiness  Medication List     TAKE these medications    acetaminophen  500 MG tablet Commonly known as: TYLENOL  Take 1,000 mg by mouth daily as needed for mild pain or moderate pain.   albuterol  108 (90 Base) MCG/ACT inhaler Commonly known as: VENTOLIN  HFA Inhale 1-2 puffs into the lungs every 6 (six) hours as needed for wheezing or shortness of breath.   allopurinol  100 MG tablet Commonly known as: ZYLOPRIM  Take 1 tablet (100 mg total) by mouth daily.    apixaban  5 MG Tabs tablet Commonly known as: ELIQUIS  Take 1 tablet (5 mg total) by mouth 2 (two) times daily.   colchicine  0.6 MG tablet Take 1 tablet (0.6 mg total) by mouth daily. As needed for acute gout attacks   DULoxetine  30 MG capsule Commonly known as: CYMBALTA  TAKE 1 CAPSULE(30 MG) BY MOUTH DAILY   ezetimibe  10 MG tablet Commonly known as: ZETIA  Take 1 tablet (10 mg total) by mouth daily.   famotidine  20 MG tablet Commonly known as: PEPCID  TAKE 1 TABLET (20 MG TOTAL) BY MOUTH DAILY AS NEEDED FOR HEARTBURN OR INDIGESTION.   Farxiga  10 MG Tabs tablet Generic drug: dapagliflozin  propanediol TAKE 1 TABLET BY MOUTH EVERY DAY   FeroSul 325 (65 Fe) MG tablet Generic drug: ferrous sulfate  Take 1 tablet (325 mg total) by mouth every other day. What changed: when to take this   furosemide  40 MG tablet Commonly known as: LASIX  Take 1 tablet (40 mg total) by mouth daily.   gabapentin  300 MG capsule Commonly known as: NEURONTIN  Take 1 capsule (300 mg total) by mouth at bedtime as needed. TAKE 1 CAP BY MOUTH EVERYDAY AT BEDTIME.   ICY HOT BACK EX Apply 1 Application topically 2 (two) times daily as needed (lower leg pain, knee pain).   losartan  50 MG tablet Commonly known as: COZAAR  TAKE 1 TABLET BY MOUTH EVERY DAY   metoprolol  tartrate 50 MG tablet Commonly known as: LOPRESSOR  Take 1 tablet (50 mg total) by mouth 2 (two) times daily.   multivitamin with minerals Tabs tablet Take 1 tablet by mouth every morning.   naloxone 4 MG/0.1ML Liqd nasal spray kit Commonly known as: NARCAN SMARTSIG:Both Nares   oxyCODONE  5 MG immediate release tablet Commonly known as: Oxy IR/ROXICODONE  Take 5 mg by mouth every 6 (six) hours as needed for moderate pain or severe pain.   potassium chloride  10 MEQ tablet Commonly known as: KLOR-CON  Take 1 tablet (10 mEq total) by mouth daily.   Vitamin D  (Ergocalciferol ) 1.25 MG (50000 UNIT) Caps capsule Commonly known as:  DRISDOL  Take 50,000 Units by mouth once a week. Take on Wednesday               Durable Medical Equipment  (From admission, onward)           Start     Ordered   02/20/24 1138  For home use only DME Walker rolling  Once       Comments: New Pacemaker implant, previously using crutches  Question Answer Comment  Walker: With 5 Inch Wheels   Patient needs a walker to treat with the following condition Systolic heart failure (HCC)      02/20/24 1137            Disposition:  Home with usual follow up as in AVS   Duration of Discharge Encounter:  APP time: 38 minutes  Signed, Creighton Doffing, NP-C, AGACNP-BC [facility] - Electrophysiology  02/20/2024, 12:03 PM

## 2024-02-22 ENCOUNTER — Ambulatory Visit: Payer: Medicare Other | Admitting: Emergency Medicine

## 2024-02-27 ENCOUNTER — Ambulatory Visit: Admitting: Physician Assistant

## 2024-02-27 ENCOUNTER — Telehealth: Payer: Self-pay

## 2024-02-27 ENCOUNTER — Telehealth: Payer: Self-pay | Admitting: Physician Assistant

## 2024-02-27 NOTE — Telephone Encounter (Signed)
 Received call from Joaquin Mulberry regarding patient.   Reports that BP was low at today's visit.   Sitting: 100/80 Standing: 90/70  Patient reports intermittent episodes of lightheadedness.   She is also complaining of 8/10 shoulder pain. Josiah Nigh contacted cardiology office regarding this pain.   Josiah Nigh is also requesting HH PT verbal orders for 2 times per week for three week. Verbal orders given per protocol.   Called patient to further discuss concerns. Advised patient that given low BP readings and episodes of lightheadedness, I would recommend office visit for evaluation. Patient declines stating that she is "feeling alright" and is not able to get around well enough to come into the office.   She does not have BP monitor at home to check BP.   Patient reports that she is taking metoprolol , losartan  and furosemide  for BP.   Advised patient that I would forward message to PCP. ED precautions discussed. Patient voices understanding.   Elsie Halo, RN

## 2024-02-27 NOTE — Telephone Encounter (Signed)
 Pt c/o of Chest Pain: STAT if active (IN THIS MOMENT) CP, including tightness, pressure, jaw pain, shoulder/upper arm/back pain, SOB, nausea, and vomiting.  1. Are you having CP right now (tightness, pressure, or discomfort)? Yes; 8 out 10 pain left shoulder  2. Are you experiencing any other symptoms (ex. SOB, nausea, vomiting, sweating)? Intermittent lightheadedness, Significient fatigue and weakness  3. How long have you been experiencing CP? 10 min ago  4. Is your CP continuous or coming and going? continuous  5. Have you taken Nitroglycerin? No   6. If CP returns before callback, please consider calling 911. ?

## 2024-02-27 NOTE — Telephone Encounter (Signed)
 Patient reports left shoulder pain that started this morning. Denies chest pain, shortness of breath, dizziness or any other associating symptoms. Denies left arm swelling, redness/drainage/swelling or any concerns for infection at ppm site. Patient advised to take tylenol  as needed for pain over the next few days and alternate heat and ice at shoulder to see if pain improves. Wound check apt 03/06/24. Pt advised to call any worsening symptoms, voiced understanding.

## 2024-02-27 NOTE — Telephone Encounter (Signed)
 Spoke with physical therapist and he states patient is complaining of  shoulder pain. She states she just had device implanted and feel its coming from her device. No chest pain or SOB. The pain is not radiating but the level is 8/10.  They also would like to know what are her restrictions for PT if any since her device placement

## 2024-02-28 ENCOUNTER — Telehealth: Payer: Self-pay | Admitting: Physician Assistant

## 2024-02-28 MED ORDER — LOSARTAN POTASSIUM 25 MG PO TABS: 25.0000 mg | ORAL_TABLET | Freq: Every day | ORAL | 3 refills | Status: DC

## 2024-02-28 NOTE — Telephone Encounter (Signed)
 Agree Marlyse Single, PA-C    02/28/2024 4:49 PM

## 2024-02-28 NOTE — Telephone Encounter (Signed)
 Received call from [facility] Nurse, [facility] reported patient blood pressure 80/58. Patient denies chest pain, denies dizziness, denies lightheadedness. Patient reports left side shoulder pain. Patient reports she has taking all morning medication. Advise patient to stay hydrated. Informed Patient and [facility] nurse that patient should call 911 or go to hospital. [facility] nurse, [facility] verbalized patient may not go. Educated reason and made patient aware that she should call 911 or go to Emergency Room. Informed patient they there are no available visit and this will be forward to her provider. Understanding verbalized.

## 2024-02-28 NOTE — Telephone Encounter (Signed)
 [facility] nurse, [place] called and  reported patient's blood pressure is 100/60 and denies dizziness, lightheadedness. Message sent to Marlyse Single, [place]. For advice. Per Marlyse Single decrease Losartan  25 mg daily and parameter to call office if If systolic > 100 and no symptoms - don't call If systolic < 100 or symptoms with systolic < 110 - call She has CHF. Unless symptomatic, would not hold furosemide  or change. If she has not had furosemide  today, she can skip it today. Made patient and [facility] nurse, [facility] aware. Understanding verbalized.

## 2024-02-28 NOTE — Telephone Encounter (Signed)
 Pt's nurse from [facility] calling back to speak with nurse. Call transferred

## 2024-03-02 ENCOUNTER — Other Ambulatory Visit: Payer: Self-pay | Admitting: Family Medicine

## 2024-03-02 DIAGNOSIS — I4891 Unspecified atrial fibrillation: Secondary | ICD-10-CM

## 2024-03-06 ENCOUNTER — Telehealth (HOSPITAL_COMMUNITY): Payer: Self-pay

## 2024-03-06 ENCOUNTER — Ambulatory Visit: Attending: Cardiovascular Disease

## 2024-03-06 DIAGNOSIS — I509 Heart failure, unspecified: Secondary | ICD-10-CM

## 2024-03-06 NOTE — Patient Instructions (Signed)

## 2024-03-06 NOTE — Telephone Encounter (Signed)
 Spoke with patient to complete pre-procedure call.     New medical conditions?  Patient concerned about an itchy, skin rash on face and neck. She denies any difficulty swallowing or breathing. Reports rash was present prior to procedure, but has worsened. Recommended patient to contact her PCP for further evaluation.  Recent hospitalizations or surgeries? BIV Pacemaker Insertion on 02/19/24 Started any new medications? No Patient made aware to contact office to inform of any new medications started. Any changes in activities of daily living? No  Pre-procedure testing scheduled: lab work on 03/19/24 after cardiology appointment Confirmed patient is taking Eliquis  twice daily and will continue taking medication before procedure or it may need to be rescheduled.  Confirmed patient is scheduled for  AV Node Ablation  on Monday, June 2 with Dr. Harvie Liner. Instructed patient to arrive at the Main Entrance A at [facility]: [address] [place], [place] [address] and check in at Admitting at 10:30 AM  Advised of plan to go home the same day and will only stay overnight if medically necessary. You MUST have a responsible adult to drive you home and MUST be with you the first 24 hours after you arrive home or your procedure could be cancelled.  Patient verbalized understanding to information provided and is agreeable to proceed with procedure.

## 2024-03-07 LAB — CUP PACEART INCLINIC DEVICE CHECK
Battery Remaining Longevity: 120 mo
Battery Voltage: 3.04 V
Brady Statistic RA Percent Paced: 0 %
Brady Statistic RV Percent Paced: 12 %
Date Time Interrogation Session: 20250507124700
Implantable Lead Connection Status: 753985
Implantable Lead Connection Status: 753985
Implantable Lead Connection Status: 753985
Implantable Lead Implant Date: 20250421
Implantable Lead Implant Date: 20250421
Implantable Lead Implant Date: 20250421
Implantable Lead Location: 753858
Implantable Lead Location: 753859
Implantable Lead Location: 753860
Implantable Pulse Generator Implant Date: 20250421
Lead Channel Impedance Value: 587.5 Ohm
Lead Channel Impedance Value: 625 Ohm
Lead Channel Impedance Value: 987.5 Ohm
Lead Channel Pacing Threshold Amplitude: 0.5 V
Lead Channel Pacing Threshold Amplitude: 0.5 V
Lead Channel Pacing Threshold Amplitude: 1.5 V
Lead Channel Pacing Threshold Amplitude: 1.5 V
Lead Channel Pacing Threshold Pulse Width: 0.5 ms
Lead Channel Pacing Threshold Pulse Width: 0.5 ms
Lead Channel Pacing Threshold Pulse Width: 0.5 ms
Lead Channel Pacing Threshold Pulse Width: 0.5 ms
Lead Channel Sensing Intrinsic Amplitude: 2.9 mV
Lead Channel Sensing Intrinsic Amplitude: 9.1 mV
Lead Channel Setting Pacing Amplitude: 3.5 V
Lead Channel Setting Pacing Amplitude: 3.5 V
Lead Channel Setting Pacing Amplitude: 3.5 V
Lead Channel Setting Pacing Pulse Width: 0.5 ms
Lead Channel Setting Pacing Pulse Width: 0.5 ms
Lead Channel Setting Sensing Sensitivity: 2 mV
Pulse Gen Model: 3562
Pulse Gen Serial Number: 8246599

## 2024-03-07 NOTE — Progress Notes (Signed)
 Normal bi-v chamber pacemaker wound check. Presenting rhythm: AS/VS. Wound well healed. Routine testing performed. Thresholds, sensing, and impedances consistent with implant measurements and at 3.5V safety margin/auto capture until 3 month visit. No episodes. BIV pacing 12%. Increased ventricular rates noted. Reviewed arm restrictions to continue for 6 weeks total post op.  Pt enrolled in remote follow-up.

## 2024-03-08 ENCOUNTER — Telehealth: Payer: Self-pay | Admitting: Internal Medicine

## 2024-03-08 NOTE — Telephone Encounter (Signed)
 Spoke to Kellie PT with [facility] she stated she saw patient this afternoon.B/P low sitting- 98/68  standing- 80/50 recheck 2 min-89/58 at present sitting 100/62 pulse 66.Stated no symptoms.Spoke to DOD Dr.Schumann he advised to stop Losartan .Advised if systolic B/P consistently in 80's she will need to go to ED.Continue to monitor B/P daily and call back if continues to be low.Keep appointment already scheduled with Marlyse Single PA 5/20 at 10:55 am.

## 2024-03-08 NOTE — Telephone Encounter (Signed)
 Pt c/o BP issue: STAT if pt c/o blurred vision, one-sided weakness or slurred speech.  STAT if BP is GREATER than 180/120 TODAY.  STAT if BP is LESS than 90/60 and SYMPTOMATIC TODAY  1. What is your BP concern? Patient's BP readings have been trending low  2. Have you taken any BP medication today?  Patient told caller she had taken her BP medication today  3. What are your last 5 BP readings?  Today around 2:45 pm 98/68 (sitting) 80/50 (standing  2 minutes later 89/58 (sitting) 100/32 (laying down)  4. Are you having any other symptoms (ex. Dizziness, headache, blurred vision, passed out)?  Occasional lightheadedness   Caller Paul Boring) reported patient had low BP readings today.  Caller wants a call back to discuss next steps.

## 2024-03-10 ENCOUNTER — Encounter: Payer: Self-pay | Admitting: Cardiology

## 2024-03-11 ENCOUNTER — Telehealth: Payer: Self-pay

## 2024-03-11 ENCOUNTER — Other Ambulatory Visit: Payer: Self-pay

## 2024-03-11 DIAGNOSIS — I4819 Other persistent atrial fibrillation: Secondary | ICD-10-CM

## 2024-03-11 NOTE — Telephone Encounter (Signed)
 AV Node instruction letter mailed to pt's home address. She will get labs done after her visit with Marlyse Single, PA on 5/20.   My number is on her letter if she has any questions after reviewing.

## 2024-03-15 ENCOUNTER — Telehealth: Payer: Self-pay | Admitting: Internal Medicine

## 2024-03-15 NOTE — Telephone Encounter (Signed)
 Pt c/o medication issue:  1. Name of Medication: furosemide  (LASIX ) 40 MG tablet   2. How are you currently taking this medication (dosage and times per day)? As advised   3. Are you having a reaction (difficulty breathing--STAT)? No   4. What is your medication issue? Home health nurse is calling requesting clarification on medication due to the pt picking up a prescription for 20 mg to take every mon, wed, fri. Please advise.

## 2024-03-15 NOTE — Telephone Encounter (Signed)
 Patient identification verified by 2 forms. Sims Duck, RN     Called and spoke to [facility] Nurse - DEVON.  Confirmed patient med list no longer includes LOSARTAN  or Lasix  20mg .  Patient now taking Lasix  40mg  daily and not taking losartan  at all.  Prescriptions changed by PCP in April 2025.   Patient denies:              Interventions/Plan: - Recommended F/U with PCP for further clarification on reason behind change.  - Encounter forwarded to covering nurse and provider to update them.   Devon verbalized understanding. No further questions at this time.

## 2024-03-15 NOTE — Telephone Encounter (Signed)
 Unable to reach homehealth nurse. LMTCB.

## 2024-03-18 NOTE — Progress Notes (Deleted)
 {This patient may be at risk for Amyloid. She has one or more dx on the prob list or PMH from the following list -  Abnormal EKG, HFpEF/Diastolic CHF, Aortic Stenosis, LVH, Bilateral Carpal Tunnel Syndrome, Biceps Tendon Rupture, Spinal Stenosis, Pericardial Effusion, Left Atrial Enlargement, Conduction System Disorder. See list below or review PMH.  Diagnoses From Problem List           Noted     Acute exacerbation of CHF (congestive heart failure) (HCC) 02/09/2024     Acute on chronic congestive heart failure (HCC) Unknown     Acute systolic heart failure (HCC) 12/01/2023     Congestive heart failure (HCC) 10/12/2012     Heart failure, left, with LVEF 41-49% (HCC) 02/12/2024     HFrEF (heart failure with reduced ejection fraction) (HCC) 01/04/2024    Click HERE to open Cardiac Amyloid Screening SmartSet to order screening OR Click HERE to defer testing for 1 year or permanently :1}    Cardiology Office Note:    Date:  03/18/2024  ID:  Donna Herrera, DOB [birth year], MRN [id number] PCP: Farris Hong, MD  [facility] Providers Cardiologist:  Ola Berger, MD Electrophysiologist:  Boyce Byes, MD { Click to update primary MD,subspecialty MD or APP then REFRESH:1}    {Click to Open Review  :1}   Patient Profile:       *** HFmrEF (heart failure with mildly reduced ejection fraction) TTE 10/2015: EF 35-40  TTE 06/06/18:  Mod LVH, EF 40-45, diff HK, Gr 1 DD, MAC, trivial MR, mild LAE, trivial TR  TTE 02/14/23: EF 50-55, no RWMA, Gr 1 DD, NL RVSF TTE 12/02/2023: EF 20-25, global HK, severely reduced RVSF, RVSP 40, mild pulmonary hypertension, moderate BAE, trivial MR, moderate TR TEE 12/04/2023: EF <20 severely reduced RVSF, severe RAE, no LAA clot, moderate MR, mild to moderate TR, trivial AI S/p CRT-P in 01/2024 Non-ischemic cardiomyopathy  Cath 05/11/16: no CAD  Permanent atrial fibrillation  Admitted 11/2023 w RVR - s/p TEE DCCV Dofetilide  Rx  Plan for AV junction  ablation in 03/2024 Hypertension  Hyperlipidemia  Tobacco use ILD (Interstitial Lung Disease)  Hx of slipped capital femoral epiphysis syndrome  S/p multiple hip surgeries   Aortic atherosclerosis             Discussed the use of AI scribe software for clinical note transcription with the patient, who gave verbal consent to proceed.  History of Present Illness Donna Herrera is a 78 y.o. female who returns for follow-up of CHF, A-fib.  She was last seen 01/30/2024.  She had been admitted in January 2025 with new onset atrial fibrillation with RVR.  Ejection fraction was again reduced at 20-25%.  GDMT was limited by low blood pressure.  She was started on dofetilide  for rhythm control.  She was having side effects to dofetilide  with headaches.  She was also having recurrent episodes of atrial fibrillation with RVR.  She saw Dr. Marven Slimmer for EP consultation 1425.  Dofetilide  was stopped.  She was set up for CRT-P implantation with eventual plan to proceed with AV junction ablation.  She was admitted 4/11-4/14 with decompensated heart failure in the setting of atrial fibrillation with RVR.  She was also treated for UTI.  She returned to the hospital 02/19/24 for CRT-P implantation.  AV junction ablation is scheduled for 04/01/2024 with Dr. Marven Slimmer.    ROS-See HPI***       Studies Reviewed:       *** Results  Risk Assessment/Calculations:   {Does this patient have ATRIAL FIBRILLATION?:[phone]} No BP recorded.  {Refresh Note OR Click here to enter BP  :1}***       Physical Exam:   VS:  There were no vitals taken for this visit.   Wt Readings from Last 3 Encounters:  02/20/24 154 lb 15.7 oz (70.3 kg)  02/12/24 165 lb 9.1 oz (75.1 kg)  01/31/24 165 lb (74.8 kg)    Physical Exam***     Assessment and Plan:  Assessment & Plan Assessment and Plan Assessment & Plan    {   Persistent atrial fibrillation (HCC) She was admitted in Jan 2025 with new onset AF w RVR. She underwent  DCCV but continued to have paroxysms of AFib. She was placed on Dofetilide  and has followed up with the AFib Clinic x 2 since DC. She was in NSR both times. Today, she was initially in AFib w RVR with HRs in the 140s. She was fairly asymptomatic but does note symptoms of fatigue and "chest cramping" as well as rapid palpitations from time to time. Some days she does not have these symptoms. A repeat EKG while she was in the office today showed NSR. So, she is have intermittent atrial fibrillation with rapid rate that is likely fairly frequent despite dofetilide  Rx. She has been having HAs and is seeing Dr. Arturo Late tomorrow. I reviewed her 1st EKG today with Dr. Avanell Bob and we questioned if this could be AFlutter as it was fairly regular. Of note, her K+ was recently low. She was started on K+ -Continue Eliquis  5 mg twice daily  -Continue Dofetilide  125 mcg twice daily  -Start Metoprolol  tartrate 25 mg twice daily  -BMET today -Keep follow up with Dr. Marven Slimmer tomorrow.  HFrEF (heart failure with reduced ejection fraction) (HCC) EF has improved in the past on GDMT. Last TTE in 01/2023 showed EF 50-55. While admitted in Jan 2025, TTE showed her EF had gone down again to < 20. This was likely related to tachycardia. Some of her meds were stopped due to to hypotension. She had a hard time tolerating ACE/ARB in the past but seems to be tolerating Losartan  well. She was previously on Hydralazine  but is off of that now. She was on Spironolactone  but is also off of that now. Will need to continue to titrate GDMT and repeat a Limited TTE to recheck her EF at some point. Cath in 2017 showed no CAD. She has had some chest pain but this sounds like symptoms related to AFib. However, if EF remains down despite optimal GDMT and maintenance of NSR, will need to consider CCTA to rule out CAD. -Continue Farxiga  10 mg once daily -Continue Losartan  50 mg once daily  -Continue Furosemide  20 mg Mon, Wed, Fri -Continue K+ 10 mEq  once daily  -BMET today. If K+ still low, add Spironolactone  back -Start Metoprolol  tartrate 25 mg twice daily >> eventually transition to Metoprolol  succinate  -She could not tolerate Entresto  in the hospital due to low BP. If BP remains elevated, consider changing ARB to Entresto   Essential hypertension BP borderline elevated. Continue Losartan  50 mg once daily. Add metoprolol  tartrate 25 mg twice daily for rate control.    :1}    {Are you ordering a CV Procedure (e.g. stress test, cath, DCCV, TEE, etc)?   Press F2        :[id number]}  Dispo:  No follow-ups on file.  Signed, Marlyse Single, PA-C

## 2024-03-19 ENCOUNTER — Ambulatory Visit: Admitting: Physician Assistant

## 2024-03-19 ENCOUNTER — Other Ambulatory Visit: Payer: Self-pay | Admitting: *Deleted

## 2024-03-21 ENCOUNTER — Other Ambulatory Visit: Payer: Self-pay | Admitting: Family Medicine

## 2024-03-21 DIAGNOSIS — M25551 Pain in right hip: Secondary | ICD-10-CM

## 2024-03-22 ENCOUNTER — Telehealth: Payer: Self-pay

## 2024-03-22 NOTE — Telephone Encounter (Signed)
 Donna Herrera [facility] with [facility] calls nurse line in regards to [facility].   He reports the patient has met all of her PT goals and will be discharged from [facility] as of today.   He reports nursing services are still in place.  Advised will make PCP aware.

## 2024-03-27 ENCOUNTER — Telehealth (HOSPITAL_COMMUNITY): Payer: Self-pay

## 2024-03-27 ENCOUNTER — Encounter: Payer: Self-pay | Admitting: Cardiology

## 2024-03-27 NOTE — Telephone Encounter (Signed)
 Spoke with patient to discuss upcoming procedure.   Labs: patient reports she had labs for procedure and [facility] done at [facility] on last week.    Any recent signs of acute illness or been started on antibiotics? Reports she completed antibiotics 2 days ago to treat UTI- reports symptoms resolved.  Any medications to hold? Hold Farxiga  for 3 days- last dose 5/29. Any missed doses of blood thinner? No Advised patient to continue taking ANTICOAGULANT: Eliquis  (Apixaban ) twice daily without missing any doses.  Medication instructions:  On the morning of your procedure DO NOT take any medication., including Eliquis  or the procedure may be rescheduled. Nothing to eat or drink after midnight prior to your procedure.  Confirmed patient is scheduled for AV Node Ablation   on Monday, June 2 with Dr. Harvie Liner. Instructed patient to arrive at the Main Entrance A at [facility]: [address] [place], [place] [address] and check in at Admitting at 11:30 AM.  Advised of plan to go home the same day and will only stay overnight if medically necessary. You MUST have a responsible adult to drive you home and MUST be with you the first 24 hours after you arrive home or your procedure could be cancelled.  Patient verbalized understanding to all instructions provided and agreed to proceed with procedure.

## 2024-03-27 NOTE — Telephone Encounter (Signed)
 Contacted [employer]- spoke with operator who sent a message to provider Alline Areas, PA-C to inquire if a recent CBC, BMP or CMP is available to be faxed to [facility] office. My return number and office fax number provided.

## 2024-03-28 ENCOUNTER — Telehealth: Payer: Self-pay

## 2024-03-28 NOTE — Telephone Encounter (Addendum)
 Received a call from Danneka at [facility], stating she is not showing that patient has completed any lab work. She also called [facility], who confirmed that patient has not had lab work drawn and orders are still active.   Spoke with patient, informing her of the above information and requested she go to [facility] either today or tomorrow to have labs drawn. Patient stated she will try to have someone take her.   All questions answered regarding procedure and medication instructions. Patient stated she wanted the information available in MyChart so her daughter would know. Advised patient, she should also have a letter available from May 12th. She voiced understanding.

## 2024-03-28 NOTE — Telephone Encounter (Signed)
 [facility] RN with [facility] calls nurse line requesting VO for nursing as follows.   1x a week for 1 week  Skip one week Home health discharge from nursing 6/20.  VO order given to [place].

## 2024-03-29 ENCOUNTER — Telehealth: Payer: Self-pay

## 2024-03-29 DIAGNOSIS — I4819 Other persistent atrial fibrillation: Secondary | ICD-10-CM

## 2024-03-29 NOTE — Telephone Encounter (Signed)
 CBC, BMET for ablation 6/2 re-ordered for lab to draw today.

## 2024-03-29 NOTE — Pre-Procedure Instructions (Signed)
 Attempted to call patient regarding procedure instructions.  No answer.  Below is the information for procedure.  Arrival time 1100 Nothing to eat or drink after midnight No meds AM of procedure Responsible person to drive you home and stay with you for 24 hours.

## 2024-03-30 LAB — BASIC METABOLIC PANEL WITH GFR
BUN/Creatinine Ratio: 20 (ref 12–28)
BUN: 21 mg/dL (ref 8–27)
CO2: 18 mmol/L — ABNORMAL LOW (ref 20–29)
Calcium: 9.3 mg/dL (ref 8.7–10.3)
Chloride: 95 mmol/L — ABNORMAL LOW (ref 96–106)
Creatinine, Ser: 1.03 mg/dL — ABNORMAL HIGH (ref 0.57–1.00)
Glucose: 90 mg/dL (ref 70–99)
Potassium: 4.8 mmol/L (ref 3.5–5.2)
Sodium: 133 mmol/L — ABNORMAL LOW (ref 134–144)
eGFR: 56 mL/min/{1.73_m2} — ABNORMAL LOW (ref >59)

## 2024-03-30 LAB — CBC
Hematocrit: 42.8 % (ref 34.0–46.6)
Hemoglobin: 13 g/dL (ref 11.1–15.9)
MCH: 27.4 pg (ref 26.6–33.0)
MCHC: 30.4 g/dL — ABNORMAL LOW (ref 31.5–35.7)
MCV: 90 fL (ref 79–97)
Platelets: 220 10*3/uL (ref 150–450)
RBC: 4.75 x10E6/uL (ref 3.77–5.28)
RDW: 13.5 % (ref 11.7–15.4)
WBC: 5.7 10*3/uL (ref 3.4–10.8)

## 2024-04-01 ENCOUNTER — Ambulatory Visit (HOSPITAL_COMMUNITY)
Admission: RE | Disposition: A | Discharge status: Home / Self Care | Payer: Self-pay | Source: Home / Self Care | Attending: Cardiology

## 2024-04-01 ENCOUNTER — Other Ambulatory Visit: Payer: Self-pay

## 2024-04-01 ENCOUNTER — Ambulatory Visit (HOSPITAL_COMMUNITY)
Admission: RE | Admit: 2024-04-01 | Discharge: 2024-04-01 | Disposition: A | Discharge status: Home / Self Care | Attending: Cardiology | Admitting: Cardiology

## 2024-04-01 DIAGNOSIS — I4821 Permanent atrial fibrillation: Secondary | ICD-10-CM | POA: Insufficient documentation

## 2024-04-01 HISTORY — PX: AV NODE ABLATION: EP1193

## 2024-04-01 SURGERY — AV NODE ABLATION

## 2024-04-01 MED ORDER — SODIUM CHLORIDE 0.9 % IV SOLN: INTRAVENOUS | Status: DC

## 2024-04-01 MED ORDER — ACETAMINOPHEN 325 MG PO TABS
ORAL_TABLET | ORAL | Status: AC
  Filled 2024-04-01: qty 2

## 2024-04-01 MED ORDER — BUPIVACAINE HCL (PF) 0.25 % IJ SOLN
INTRAMUSCULAR | Status: DC | PRN
  Administered 2024-04-01: 20 mL

## 2024-04-01 MED ORDER — CEFAZOLIN SODIUM-DEXTROSE 2-4 GM/100ML-% IV SOLN
INTRAVENOUS | Status: AC
Start: 2024-04-01 — End: 2024-04-01
  Administered 2024-04-01: 2 g
  Filled 2024-04-01: qty 100

## 2024-04-01 MED ORDER — FENTANYL CITRATE (PF) 100 MCG/2ML IJ SOLN
INTRAMUSCULAR | Status: DC | PRN
  Administered 2024-04-01: 12.5 ug via INTRAVENOUS

## 2024-04-01 MED ORDER — BUPIVACAINE HCL (PF) 0.25 % IJ SOLN
INTRAMUSCULAR | Status: AC
  Filled 2024-04-01: qty 30

## 2024-04-01 MED ORDER — ACETAMINOPHEN 325 MG PO TABS: 650.0000 mg | ORAL_TABLET | ORAL | Status: DC | PRN

## 2024-04-01 MED ORDER — HEPARIN SODIUM (PORCINE) 1000 UNIT/ML IJ SOLN
INTRAMUSCULAR | Status: DC | PRN
Start: 2024-04-01 — End: 2024-04-02
  Administered 2024-04-01: 1000 [IU] via INTRAVENOUS

## 2024-04-01 MED ORDER — MIDAZOLAM HCL 2 MG/2ML IJ SOLN
INTRAMUSCULAR | Status: AC
  Filled 2024-04-01: qty 2

## 2024-04-01 MED ORDER — FENTANYL CITRATE (PF) 100 MCG/2ML IJ SOLN
INTRAMUSCULAR | Status: AC
  Filled 2024-04-01: qty 2

## 2024-04-01 MED ORDER — OXYCODONE HCL 5 MG PO TABS
5.0000 mg | ORAL_TABLET | Freq: Once | ORAL | Status: AC
  Administered 2024-04-01: 5 mg via ORAL
  Filled 2024-04-01: qty 1

## 2024-04-01 MED ORDER — SODIUM CHLORIDE 0.9% FLUSH: 3.0000 mL | INTRAVENOUS | Status: DC | PRN

## 2024-04-01 MED ORDER — HEPARIN (PORCINE) IN NACL 1000-0.9 UT/500ML-% IV SOLN
INTRAVENOUS | Status: DC | PRN
  Administered 2024-04-01: 500 mL

## 2024-04-01 MED ORDER — CEFAZOLIN SODIUM-DEXTROSE 1-4 GM/50ML-% IV SOLN: INTRAVENOUS | Status: DC | PRN

## 2024-04-01 MED ORDER — ONDANSETRON HCL 4 MG/2ML IJ SOLN: 4.0000 mg | Freq: Four times a day (QID) | INTRAMUSCULAR | Status: DC | PRN

## 2024-04-01 MED ORDER — MIDAZOLAM HCL 5 MG/5ML IJ SOLN
INTRAMUSCULAR | Status: DC | PRN
  Administered 2024-04-01: .5 mg via INTRAVENOUS

## 2024-04-01 MED ORDER — SODIUM CHLORIDE 0.9 % IV SOLN: 250.0000 mL | INTRAVENOUS | Status: DC | PRN

## 2024-04-01 MED ORDER — SODIUM CHLORIDE 0.9% FLUSH: 3.0000 mL | Freq: Two times a day (BID) | INTRAVENOUS | Status: DC

## 2024-04-01 SURGICAL SUPPLY — 9 items
BAG SNAP BAND KOVER 36X36 (MISCELLANEOUS) IMPLANT
CATH SMTCH THERMOCOOL SF FJ (CATHETERS) IMPLANT
CLOSURE PERCLOSE PROSTYLE (VASCULAR PRODUCTS) IMPLANT
PACK EP LF (CUSTOM PROCEDURE TRAY) ×1 IMPLANT
PAD DEFIB RADIO PHYSIO CONN (PAD) ×1 IMPLANT
PATCH CARTO3 (PAD) IMPLANT
SHEATH PINNACLE 8F 10CM (SHEATH) IMPLANT
SHEATH PROBE COVER 6X72 (BAG) IMPLANT
TUBING SMART ABLATE COOLFLOW (TUBING) IMPLANT

## 2024-04-01 NOTE — H&P (Signed)
 Electrophysiology Office Note:     Date:  04/01/2024    ID:  Donna Herrera, DOB Dec 12, 1945, MRN [id number]   [facility] Cardiologist:  Ola Berger, MD  [facility] Electrophysiologist:  Boyce Byes, MD    Referring MD: Farris Hong, MD    Chief Complaint: Atrial fibrillation   History of Present Illness:     Donna Herrera is a 78 year old woman who I am seeing today for an evaluation of atrial fibrillation at the request of Marlyse Single, PA-C.     I last saw the patient in February of this year when she was hospitalized for Tikosyn  load.     The patient has a history of chronic systolic heart failure with an EF less than 20% on an echo in February of this year.  Her medical history also includes persistent atrial fibrillation on Tikosyn , hypertension, hyperlipidemia, tobacco use, interstitial lung disease and aortic atherosclerosis.   She is with family today in clinic.  She continues to be very symptomatic with fatigue.  Some nighttime shortness of breath.  She presents for AV node ablation today.  Procedure reviewed.    Objective Their past medical, social and family history was reviewed.     ROS:   Please see the history of present illness.    All other systems reviewed and are negative.   EKGs/Labs/Other Studies Reviewed:     The following studies were reviewed today:   January 30, 2024 EKG shows atrial flutter, PVC   January 22, 2024 EKG shows sinus rhythm, PAC with a right bundle aberrancy, QTc of 438 ms EKG Interpretation Date/Time:                  Wednesday January 31 2024 15:42:02 EDT Ventricular Rate:         77 PR Interval:                   QRS Duration:             114 QT Interval:                 436 QTC Calculation:493 R Axis:                         -36   Text Interpretation:Atrial fibrillation Confirmed by Harvie Liner [phone]) on 01/31/2024 4:19:39 PM      Physical Exam:     VS:  BP 134/80   Pulse 77   Ht 5\' 6"  (1.676  m)   Wt 165 lb (74.8 kg)   SpO2 95%   BMI 26.63 kg/m         Wt Readings from Last 3 Encounters:  01/31/24 165 lb (74.8 kg)  01/30/24 165 lb 6.4 oz (75 kg)  01/22/24 162 lb (73.5 kg)      GEN: no distress..  No distress laying at 30 degrees on exam table. CARD: Irregularly irregular, No MRG RESP: No IWOB. CTAB.         Assessment ASSESSMENT AND PLAN:     1. Persistent atrial fibrillation (HCC)   2. Heart failure with improved ejection fraction (HFimpEF) (HCC)   3. Hypertension, unspecified type   4. Other hyperlipidemia   5. Prediabetes   6. SOB (shortness of breath)       # Permanent atrial fibrillation #High risk med monitoring-dofetilide    The patient has atrial fibrillation associated with systolic heart failure.  She has been managed with Tikosyn  although  this has proven not completely effective.  She is on Eliquis  for stroke prophylaxis.  I discussed options during today's clinic appointment.  For now, I would like her to stop the Tikosyn .  I suspect stopping the Tikosyn  will help the headaches which may improve her overall symptomatology.  I discussed amiodarone and its potential toxicities including pulmonary toxicity and the patient is very clear that she is not interested in trying amiodarone.  We discussed pursuing CRT-P implant followed by AV junction ablation.  She would like to proceed with this strategy.  She understands that there is no guarantee that this will improve her symptoms.  We discussed the role of defibrillator therapy but given the patient's comorbidities and age, I do not think she is a good candidate.  Would plan for CRT-P.  She will need to hold her Eliquis  for 3 days prior to implant.   Risk, benefits, and alternatives to AV junction ablation were also discussed in detail today. These risks include but are not limited to stroke, bleeding, vascular damage, tamponade, perforation, damage to the heart and other structures, worsening renal function,  and death. The patient understands these risk and wishes to proceed.     Presents for AV node ablation. Procedure reviewed.   Signed, Leanora Prophet. Marven Slimmer, MD, [facility], [facility] 04/01/2024 Electrophysiology [facility]

## 2024-04-01 NOTE — Discharge Instructions (Signed)

## 2024-04-02 ENCOUNTER — Telehealth (HOSPITAL_COMMUNITY): Payer: Self-pay

## 2024-04-02 ENCOUNTER — Encounter (HOSPITAL_COMMUNITY): Payer: Self-pay | Admitting: Cardiology

## 2024-04-02 NOTE — Telephone Encounter (Signed)
 Spoke with patient to complete post procedure follow up call.  Patient reports no complications with groin sites.   Instructions reviewed with patient:  Remove large bandage at puncture site after 24 hours. It is normal to have bruising, tenderness, mild swelling, and a pea or marble sized lump/knot at the groin site which can take up to three months to resolve.  Get help right away if you notice sudden swelling at the puncture site.  Check your puncture site every day for signs of infection: fever, redness, swelling, pus drainage, warmth, foul odor or excessive pain. If this occurs, please call the office at [phone], to speak with the nurse. Get help right away if your puncture site is bleeding and the bleeding does not stop after applying firm pressure to the area.  You may continue to have skipped beats during the first several months after your procedure.  It is very important not to miss any doses of your blood thinner Eliquis . Patient restarted taking this medication on 04/01/24.   You will follow up with the APP on 05/21/24.   Patient verbalized understanding to all instructions provided.

## 2024-04-03 ENCOUNTER — Ambulatory Visit (INDEPENDENT_AMBULATORY_CARE_PROVIDER_SITE_OTHER)

## 2024-04-03 DIAGNOSIS — I4819 Other persistent atrial fibrillation: Secondary | ICD-10-CM | POA: Diagnosis not present

## 2024-04-03 LAB — CUP PACEART REMOTE DEVICE CHECK
Battery Remaining Longevity: 86 mo
Battery Remaining Percentage: 95.5 %
Battery Voltage: 3.02 V
Date Time Interrogation Session: 20250604024259
Implantable Lead Connection Status: 753985
Implantable Lead Connection Status: 753985
Implantable Lead Connection Status: 753985
Implantable Lead Implant Date: 20250421
Implantable Lead Implant Date: 20250421
Implantable Lead Implant Date: 20250421
Implantable Lead Location: 753858
Implantable Lead Location: 753859
Implantable Lead Location: 753860
Implantable Pulse Generator Implant Date: 20250421
Lead Channel Impedance Value: 510 Ohm
Lead Channel Impedance Value: 560 Ohm
Lead Channel Pacing Threshold Amplitude: 0.625 V
Lead Channel Pacing Threshold Amplitude: 0.75 V
Lead Channel Pacing Threshold Pulse Width: 0.5 ms
Lead Channel Pacing Threshold Pulse Width: 0.5 ms
Lead Channel Sensing Intrinsic Amplitude: 8.4 mV
Lead Channel Setting Pacing Amplitude: 2 V
Lead Channel Setting Pacing Amplitude: 2 V
Lead Channel Setting Pacing Pulse Width: 0.5 ms
Lead Channel Setting Pacing Pulse Width: 0.5 ms
Lead Channel Setting Sensing Sensitivity: 2 mV
Pulse Gen Model: 3562
Pulse Gen Serial Number: 8246599

## 2024-04-03 MED FILL — Midazolam HCl Inj 2 MG/2ML (Base Equivalent): INTRAMUSCULAR | Qty: 0.5 | Status: AC

## 2024-04-06 ENCOUNTER — Other Ambulatory Visit: Payer: Self-pay | Admitting: Family Medicine

## 2024-04-06 ENCOUNTER — Ambulatory Visit: Payer: Self-pay | Admitting: Cardiology

## 2024-04-06 DIAGNOSIS — I4891 Unspecified atrial fibrillation: Secondary | ICD-10-CM

## 2024-04-15 ENCOUNTER — Other Ambulatory Visit: Payer: Self-pay | Admitting: Family Medicine

## 2024-04-19 ENCOUNTER — Ambulatory Visit

## 2024-04-24 ENCOUNTER — Ambulatory Visit: Admitting: Pulmonary Disease

## 2024-04-29 ENCOUNTER — Ambulatory Visit (INDEPENDENT_AMBULATORY_CARE_PROVIDER_SITE_OTHER): Admitting: Family Medicine

## 2024-04-29 ENCOUNTER — Encounter: Payer: Self-pay | Admitting: Family Medicine

## 2024-04-29 ENCOUNTER — Ambulatory Visit: Payer: Medicare Other

## 2024-04-29 VITALS — Ht 66.0 in | Wt 149.0 lb

## 2024-04-29 DIAGNOSIS — Z0000 Encounter for general adult medical examination without abnormal findings: Secondary | ICD-10-CM | POA: Diagnosis not present

## 2024-04-29 NOTE — Progress Notes (Signed)
 Because this visit was a virtual/telehealth visit,  certain criteria was not obtained, such a blood pressure, CBG if applicable, and timed get up and go. Any medications not marked as taking were not mentioned during the medication reconciliation part of the visit. Any vitals not documented were not able to be obtained due to this being a telehealth visit or patient was unable to self-report a recent blood pressure reading due to a lack of equipment at home via telehealth. Vitals that have been documented are verbally provided by the patient.   Subjective:   Donna Herrera is a 78 y.o. who presents for a Medicare Wellness preventive visit.  As a reminder, Annual Wellness Visits don't include a physical exam, and some assessments may be limited, especially if this visit is performed virtually. We may recommend an in-person follow-up visit with your provider if needed.  Visit Complete: Virtual I connected with  Donna Herrera on 04/29/24 by a audio enabled telemedicine application and verified that I am speaking with the correct person using two identifiers.  Patient Location: Home  Provider Location: Office/Clinic  I discussed the limitations of evaluation and management by telemedicine. The patient expressed understanding and agreed to proceed.  Vital Signs: Because this visit was a virtual/telehealth visit, some criteria may be missing or patient reported. Any vitals not documented were not able to be obtained and vitals that have been documented are patient reported.  VideoDeclined- This patient declined Librarian, academic. Therefore the visit was completed with audio only.  Persons Participating in Visit: Patient.  AWV Questionnaire: No: Patient Medicare AWV questionnaire was not completed prior to this visit.  Cardiac Risk Factors include: advanced age (>15men, >77 women);hypertension;dyslipidemia;sedentary lifestyle;family history of  premature cardiovascular disease     Objective:    Today's Vitals   04/29/24 1308  Weight: 149 lb (67.6 kg)  Height: 5' 6 (1.676 m)  PainSc: 10-Worst pain ever  PainLoc: Generalized   Body mass index is 24.05 kg/m.     04/29/2024    1:12 PM 04/01/2024   12:17 PM 02/19/2024    6:00 PM 02/19/2024   11:58 AM 02/09/2024    2:46 PM 02/09/2024   11:19 AM 01/04/2024    1:24 PM  Advanced Directives  Does Patient Have a Medical Advance Directive? No Yes  Yes Yes Yes No  Type of Special educational needs teacher of [place];Living will Healthcare Power of [place];Living will Healthcare Power of [place];Living will Living will Living will   Does patient want to make changes to medical advance directive?  No - Patient declined No - Patient declined  No - Patient declined No - Patient declined   Copy of Healthcare Power of Attorney in Chart?  No - copy requested       Would patient like information on creating a medical advance directive? No - Patient declined      No - Patient declined    Current Medications (verified) Outpatient Encounter Medications as of 04/29/2024  Medication Sig   acetaminophen  (TYLENOL ) 500 MG tablet Take 1,000 mg by mouth daily as needed for mild pain or moderate pain.   albuterol  (VENTOLIN  HFA) 108 (90 Base) MCG/ACT inhaler Inhale 1-2 puffs into the lungs every 6 (six) hours as needed for wheezing or shortness of breath.   allopurinol  (ZYLOPRIM ) 100 MG tablet Take 1 tablet (100 mg total) by mouth daily.   colchicine  0.6 MG tablet Take 1 tablet (0.6 mg total) by mouth daily. As needed for  acute gout attacks (Patient taking differently: Take 0.6 mg by mouth daily as needed (gout flares).)   DULoxetine  (CYMBALTA ) 30 MG capsule TAKE 1 CAPSULE(30 MG) BY MOUTH DAILY   ELIQUIS  5 MG TABS tablet TAKE 1 TABLET BY MOUTH TWICE A DAY   ezetimibe  (ZETIA ) 10 MG tablet Take 1 tablet (10 mg total) by mouth daily.   famotidine  (PEPCID ) 20 MG tablet TAKE 1 TABLET (20 MG TOTAL) BY MOUTH  DAILY AS NEEDED FOR HEARTBURN OR INDIGESTION.   FARXIGA  10 MG TABS tablet TAKE 1 TABLET BY MOUTH EVERY DAY   ferrous sulfate  325 (65 FE) MG tablet Take 1 tablet (325 mg total) by mouth every other day. (Patient taking differently: Take 325 mg by mouth once a week.)   furosemide  (LASIX ) 40 MG tablet Take 1 tablet (40 mg total) by mouth daily.   gabapentin  (NEURONTIN ) 300 MG capsule Take 1 capsule (300 mg total) by mouth at bedtime as needed. TAKE 1 CAP BY MOUTH EVERYDAY AT BEDTIME. (Patient taking differently: Take 300 mg by mouth at bedtime as needed.)   losartan  (COZAAR ) 50 MG tablet Take 50 mg by mouth daily.   Menthol , Topical Analgesic, (ICY HOT BACK EX) Apply 1 Application topically 2 (two) times daily as needed (lower leg pain, knee pain).   metoprolol  tartrate (LOPRESSOR ) 50 MG tablet Take 1 tablet (50 mg total) by mouth 2 (two) times daily.   Multiple Vitamin (MULTIVITAMIN WITH MINERALS) TABS tablet Take 1 tablet by mouth every morning.   naloxone (NARCAN) nasal spray 4 mg/0.1 mL SMARTSIG:Both Nares   oxyCODONE  (OXY IR/ROXICODONE ) 5 MG immediate release tablet Take 5 mg by mouth every 6 (six) hours as needed for moderate pain or severe pain.   potassium chloride  (KLOR-CON ) 10 MEQ tablet Take 1 tablet (10 mEq total) by mouth daily.   Vitamin D , Ergocalciferol , (DRISDOL ) 1.25 MG (50000 UNIT) CAPS capsule Take 50,000 Units by mouth once a week. Take on Wednesday   No facility-administered encounter medications on file as of 04/29/2024.    Allergies (verified) Crab [shellfish allergy], Morphine and codeine, Atorvastatin , and Rosuvastatin    History: Past Medical History:  Diagnosis Date   Acute exacerbation of CHF (congestive heart failure) (HCC) 02/09/2024   Adenomatous polyp 03/23/2020   Colonoscopy 2021 notable for adenomatous polyp. Per GI, no further follow up colonoscopies recommended given patient's age.   Allergy    Arthritis    Atherosclerosis of aorta (HCC) 07/15/2021    Candidiasis, mouth 10/19/2017   Chronic combined systolic and diastolic CHF (congestive heart failure) (HCC) 10/21/2015   a. 12/2014 Echo: EF 40%; b. 10/2015 Echo: EF 25-40%; c. 02/2018 Echo: EF 15-20%; d. 01/2023 Echo: EF 50-55%; e. 12/2023 Echo: EF 20-25%, glob HK, RVSP , mod BAE, triv MR, mod TR.   Chronic left shoulder pain 04/10/2013   Chronic lung disease    Chronic pain    Chronic pain syndrome 07/20/2017   Confusion 06/08/2021   Depression 08/25/2016   DJD (degenerative joint disease) 10/12/2012   Multiple joint replacements by Dr. Carlos.    Dyshidrotic eczema 12/31/2014   Edentulous 07/19/2021   Emphysema lung (HCC) 03/02/2021   Long smoking history.  Noted on CT Low Dose Chest: 01/2021: IMPRESSION: Chronic interstitial changes raise the question of pulmonary fibrosis. High-resolution chest CT may prove helpful to further evaluate as clinically warranted.Emphysema (ICD10-J43.9) and Aortic Atherosclerosis (ICD10-170.0)  Recommended pulmonology referral but patient declined. Recommend continued discussion and symptom monitor   Essential hypertension 07/20/2017   GERD (gastroesophageal  reflux disease)    Gout    H/O slipped capital femoral epiphysis (SCFE)    Heart failure, left, with LVEF 41-49% (HCC) 10/21/2015   transthoracic echocardiogram 06/06/18: EF appears improved to 40-45% with diffuse hypokinesis  A. Echo 3/16: EF 40%, diffuse HK, mild MR, moderate LAE, mild RAE, PASP 42 mmHg  //  B. Echo 12/16: Mild LVH, EF 35-40%, diffuse HK, grade 2 diastolic dysfunction, MAC, mild MR, moderate LAE, normal RVSF, PASP 49 mmHg   History of total right hip replacement 04/02/2019   Followed closely with orthopedist Dr. Ernie. Patient is s/p total right hip replacement in 1980's with evidence of bone loss and osteopenia making reconstruction unlikely. Plan for conservative treatment at this time with crutches for ambulation and pain control.   Hyperlipidemia    Hypertension    Hypertensive  heart disease with CHF (congestive heart failure) (HCC) 10/12/2012   Loss of taste 02/06/2018   Mass of breast, left 07/21/2014   NICM (nonischemic cardiomyopathy) (HCC) 06/09/2016   a. 04/2016 Cath: Normal coronary arteries, EF 30-35%, LVEDP 14 mmHg; b. 12/2014 Echo: EF 40%; c. 10/2015 Echo: EF 25-40%; d. 02/2018 Echo: EF 15-20%; e. 01/2023 Echo: EF 50-55%; f. 12/2023 Echo: EF 20-25%, glob HK, RVSP , mod BAE, triv MR, mod TR.   Pain in gums 04/18/2018   Persistent atrial fibrillation (HCC)    a. 12/2023 s/p TEE/DCCV - 200J-->tikosyn  loaded; b. 01/2024 ERAF->tikosyn  d/c'd.   Pre-diabetes    Pulmonary fibrosis (HCC) 03/02/2021   CT Low Dose Chest: 01/2021: IMPRESSION: Chronic interstitial changes raise the question of pulmonary fibrosis. High-resolution chest CT may prove helpful to further evaluate as clinically warranted.Emphysema (ICD10-J43.9) and Aortic Atherosclerosis (ICD10-170.0)  Recommended pulmonology referral but patient declined. Recommend continued discussion and symptom monitoring.   Pulmonary nodule 1 cm or greater in diameter 03/02/2021   CT low dose Chest: 01/2021: 1. Lung-RADS 4A, suspicious. 10 mm posterior left lower lobe pulmonary nodule, possibly scarring. Follow up low-dose chest CT without contrast in 3 months (please use the following order, CT CHEST LCS NODULE FOLLOW-UP W/O CM) is recommended. Alternatively, PET may be considered when there is a solid component 8mm or larger.  Plan: Follow up low dose CT ordered in 3 mon   Rib pain on left side 09/17/2020   Right hip pain 11/02/2012   Tendonitis, Achilles, left 05/24/2017   Tobacco abuse 09/29/2014   Urge incontinence 07/19/2021   Vitamin D  deficiency 12/31/2014   Past Surgical History:  Procedure Laterality Date   AV NODE ABLATION N/A 04/01/2024   Procedure: AV NODE ABLATION;  Surgeon: Cindie Ole DASEN, MD;  Location: [facility] INVASIVE CV LAB;  Service: Cardiovascular;  Laterality: N/A;   BIV PACEMAKER INSERTION CRT-P N/A  02/19/2024   Procedure: BIV PACEMAKER INSERTION CRT-P;  Surgeon: Cindie Ole DASEN, MD;  Location: [facility] INVASIVE CV LAB;  Service: Cardiovascular;  Laterality: N/A;   CARDIAC CATHETERIZATION  2005   normal-Hochrein   CARDIAC CATHETERIZATION N/A 05/11/2016   Procedure: Right/Left Heart Cath and Coronary Angiography;  Surgeon: Peter M Swaziland, MD;  Location: [facility] INVASIVE CV LAB;  Service: Cardiovascular;  Laterality: N/A;   CARDIOVERSION N/A 12/04/2023   Procedure: CARDIOVERSION;  Surgeon: Barbaraann Darryle Ned, MD;  Location: [facility] INVASIVE CV LAB;  Service: Cardiovascular;  Laterality: N/A;   CESAREAN SECTION     COLONOSCOPY  1990's ??   HIP FUSION Left    left    JOINT REPLACEMENT Bilateral 1991, 1997   [place]   LEAD INSERTION  N/A 02/19/2024   Procedure: LEAD INSERTION;  Surgeon: Cindie Ole DASEN, MD;  Location: [facility] INVASIVE CV LAB;  Service: Cardiovascular;  Laterality: N/A;   ROTATOR CUFF REPAIR     bilateral   TOOTH EXTRACTION N/A 04/03/2020   Procedure: DENTAL RESTORATION/EXTRACTIONS;  Surgeon: Sheryle Hamilton, DDS;  Location: [facility] OR;  Service: Oral Surgery;  Laterality: N/A;   TOTAL HIP ARTHROPLASTY  1987   right. Dr. Carlos   TRANSESOPHAGEAL ECHOCARDIOGRAM (CATH LAB) N/A 12/04/2023   Procedure: TRANSESOPHAGEAL ECHOCARDIOGRAM;  Surgeon: Barbaraann Darryle Ned, MD;  Location: [facility] INVASIVE CV LAB;  Service: Cardiovascular;  Laterality: N/A;   TUBAL LIGATION     Family History  Problem Relation Age of Onset   Depression Mother    Diabetes Mother    Hypertension Mother    Stroke Mother    Heart disease Mother    Cancer Father        unknown primary   Diabetes Sister    Stomach cancer Brother    Throat cancer Son    Breast cancer Neg Hx    Esophageal cancer Neg Hx    Colon cancer Neg Hx    Colon polyps Neg Hx    Social History   Socioeconomic History   Marital status: Widowed    Spouse name: Not on file   Number of children: 3   Years of education: Not on file   Highest  education level: High school graduate  Occupational History   Occupation: retired  Tobacco Use   Smoking status: Former    Current packs/day: 0.00    Average packs/day: 0.5 packs/day for 55.0 years (27.5 ttl pk-yrs)    Types: Cigarettes    Start date: 28    Quit date: 2017    Years since quitting: 8.4    Passive exposure: Past   Smokeless tobacco: Never   Tobacco comments:    Former smoker 01/22/24  Vaping Use   Vaping status: Never Used  Substance and Sexual Activity   Alcohol use: Not Currently   Drug use: No   Sexual activity: Not on file  Other Topics Concern   Not on file  Social History Narrative   Formerly daycare provider. Lives with husband, daughter Porter and grandkids.   Social Drivers of Corporate investment banker Strain: Low Risk  (04/29/2024)   Overall Financial Resource Strain (CARDIA)    Difficulty of Paying Living Expenses: Not hard at all  Food Insecurity: No Food Insecurity (04/29/2024)   Hunger Vital Sign    Worried About Running Out of Food in the Last Year: Never true    Ran Out of Food in the Last Year: Never true  Transportation Needs: No Transportation Needs (04/29/2024)   PRAPARE - Administrator, Civil Service (Medical): No    Lack of Transportation (Non-Medical): No  Physical Activity: Insufficiently Active (04/29/2024)   Exercise Vital Sign    Days of Exercise per Week: 3 days    Minutes of Exercise per Session: 30 min  Stress: No Stress Concern Present (04/29/2024)   [employer] of Occupational Health - Occupational Stress Questionnaire    Feeling of Stress: Only a little  Social Connections: Moderately Isolated (04/29/2024)   Social Connection and Isolation Panel    Frequency of Communication with Friends and Family: More than three times a week    Frequency of Social Gatherings with Friends and Family: Three times a week    Attends Religious Services: More than 4 times per  year    Active Member of Clubs or Organizations:  No    Attends Banker Meetings: Never    Marital Status: Widowed    Tobacco Counseling Counseling given: Not Answered Tobacco comments: Former smoker 01/22/24    Clinical Intake:  Pre-visit preparation completed: Yes  Pain : 0-10 Pain Score: 10-Worst pain ever Pain Type: Chronic pain Pain Location: Generalized Pain Descriptors / Indicators: Constant, Discomfort Pain Onset: More than a month ago Pain Frequency: Constant Pain Relieving Factors: Pain Medication  Pain Relieving Factors: Pain Medication  BMI - recorded: 24.05 Nutritional Status: BMI of 19-24  Normal Nutritional Risks: None Diabetes: No  Lab Results  Component Value Date   HGBA1C 5.6 02/09/2024   HGBA1C 6.0 (H) 11/03/2023   HGBA1C 6.3 04/04/2023     How often do you need to have someone help you when you read instructions, pamphlets, or other written materials from your doctor or pharmacy?: 1 - Never  Interpreter Needed?: No  Information entered by :: Roz Fuller, LPN.   Activities of Daily Living     04/29/2024    1:15 PM 02/19/2024    6:00 PM  In your present state of health, do you have any difficulty performing the following activities:  Hearing? 0 0  Vision? 0 0  Difficulty concentrating or making decisions? 0 0  Walking or climbing stairs? 0   Dressing or bathing? 0   Doing errands, shopping? 0   Preparing Food and eating ? N   Using the Toilet? N   In the past six months, have you accidently leaked urine? Y   Do you have problems with loss of bowel control? N   Managing your Medications? N   Managing your Finances? N   Housekeeping or managing your Housekeeping? N     Patient Care Team: Lonnie Earnest, MD as PCP - General (Family Medicine) Okey Vina GAILS, MD as PCP - Cardiology (Cardiology) Cindie Ole DASEN, MD as PCP - Electrophysiology (Cardiology)  I have updated your Care Teams any recent Medical Services you may have received from other providers in the  past year.     Assessment:   This is a routine wellness examination for Donna Herrera.  Hearing/Vision screen Hearing Screening - Comments:: Denies hearing difficulties.  Vision Screening - Comments:: Wears rx glasses - not up to date with routine eye exams with Timothy McFarland, OD. Needs a new optometrist    Goals Addressed             This Visit's Progress    04/29/24: To feel better         Depression Screen     04/29/2024    1:21 PM 04/29/2024    9:12 AM 01/04/2024    1:24 PM 12/01/2023    1:32 PM 11/03/2023   10:55 AM 08/01/2023    3:54 PM 04/28/2023    9:22 AM  PHQ 2/9 Scores  PHQ - 2 Score 3 3 1 2 1   0  PHQ- 9 Score 9 9 9 13 6   0  Exception Documentation      Patient refusal     Fall Risk     04/29/2024    1:12 PM 04/29/2024    9:12 AM 01/04/2024    1:24 PM 12/01/2023    1:31 PM 11/03/2023   10:55 AM  Fall Risk   Falls in the past year? 0 0 0 0 0  Number falls in past yr: 0 0 0 0 0  Injury with Fall? 0 0 0 0 0  Risk for fall due to : No Fall Risks  Impaired balance/gait;Impaired mobility  Impaired balance/gait;Impaired mobility  Follow up Falls evaluation completed  Falls prevention discussed  Falls prevention discussed    MEDICARE RISK AT HOME:  Medicare Risk at Home Any stairs in or around the home?: Yes (front entrance) If so, are there any without handrails?: No Home free of loose throw rugs in walkways, pet beds, electrical cords, etc?: Yes Adequate lighting in your home to reduce risk of falls?: Yes Life alert?: No Use of a cane, walker or w/c?: Yes Grab bars in the bathroom?: Yes Shower chair or bench in shower?: Yes Elevated toilet seat or a handicapped toilet?: Yes  TIMED UP AND GO:  Was the test performed?    Cognitive Function: 6CIT completed    04/29/2024    1:12 PM  MMSE - Mini Mental State Exam  Not completed: Unable to complete      07/15/2021    4:17 PM  Montreal Cognitive Assessment   Visuospatial/ Executive (0/5) 3  Naming (0/3) 3   Attention: Read list of digits (0/2) 1  Attention: Read list of letters (0/1) 1  Attention: Serial 7 subtraction starting at 100 (0/3) 1  Language: Repeat phrase (0/2) 1  Language : Fluency (0/1) 0  Abstraction (0/2) 1  Delayed Recall (0/5) 0  Orientation (0/6) 5  Total 16      04/29/2024    1:12 PM 04/28/2023    9:24 AM  6CIT Screen  What Year? 0 points 0 points  What month? 0 points 0 points  What time? 0 points 0 points  Count back from 20 0 points 0 points  Months in reverse 0 points 0 points  Repeat phrase 0 points 2 points  Total Score 0 points 2 points    Immunizations Immunization History  Administered Date(s) Administered   Fluad Quad(high Dose 65+) 09/16/2020, 07/15/2021, 07/19/2022   Fluad Trivalent(High Dose 65+) 08/01/2023   Influenza,inj,Quad PF,6+ Mos 07/31/2013, 09/24/2014, 09/15/2015, 09/28/2016, 09/11/2017, 08/14/2018, 08/07/2019   PFIZER Comirnaty(Gray Top)Covid-19 Tri-Sucrose Vaccine 03/18/2021   PFIZER(Purple Top)SARS-COV-2 Vaccination 12/12/2019, 01/06/2020, 09/16/2020   Pfizer(Comirnaty)Fall Seasonal Vaccine 12 years and older 11/03/2023   Pneumococcal Conjugate-13 09/24/2014   Pneumococcal Polysaccharide-23 12/14/2016   Tdap 12/14/2016    Screening Tests Health Maintenance  Topic Date Due   Zoster Vaccines- Shingrix (1 of 2) Never done   COVID-19 Vaccine (6 - Pfizer risk 2024-25 season) 05/02/2024   INFLUENZA VACCINE  05/31/2024   Lung Cancer Screening  12/01/2024   Medicare Annual Wellness (AWV)  04/29/2025   DTaP/Tdap/Td (2 - Td or Tdap) 12/14/2026   Pneumococcal Vaccine: 50+ Years  Completed   DEXA SCAN  Completed   Hepatitis C Screening  Completed   Hepatitis B Vaccines  Aged Out   HPV VACCINES  Aged Out   Meningococcal B Vaccine  Aged Out   Colonoscopy  Discontinued   Fecal DNA (Cologuard)  Discontinued    Health Maintenance  Health Maintenance Due  Topic Date Due   Zoster Vaccines- Shingrix (1 of 2) Never done   COVID-19  Vaccine (6 - Pfizer risk 2024-25 season) 05/02/2024   Health Maintenance Items Addressed: Yes Patient aware of current care gaps.  Immunization record was verified by [employer].  Patient is due for Covid-19 and Shingrix vaccines.  Additional Screening:  Vision Screening: Recommended annual ophthalmology exams for early detection of glaucoma and other disorders of the eye. Would  you like a referral to an eye doctor? Yes    Dental Screening: Recommended annual dental exams for proper oral hygiene  Community Resource Referral / Chronic Care Management: CRR required this visit?  No   CCM required this visit?  No   Plan:    I have personally reviewed and noted the following in the patient's chart:   Medical and social history Use of alcohol, tobacco or illicit drugs  Current medications and supplements including opioid prescriptions. Patient is currently taking opioid prescriptions. Information provided to patient regarding non-opioid alternatives. Patient advised to discuss non-opioid treatment plan with their provider. Functional ability and status Nutritional status Physical activity Advanced directives List of other physicians Hospitalizations, surgeries, and ER visits in previous 12 months Vitals Screenings to include cognitive, depression, and falls Referrals and appointments  In addition, I have reviewed and discussed with patient certain preventive protocols, quality metrics, and best practice recommendations. A written personalized care plan for preventive services as well as general preventive health recommendations were provided to patient.   Roz LOISE Fuller, LPN   [id number]   After Visit Summary: (MyChart) Due to this being a telephonic visit, the after visit summary with patients personalized plan was offered to patient via MyChart   Notes: Patient aware of current care gaps.  Immunization record was verified by [employer].  Patient is due for Shingrix and Covid-19  vaccines.  Patient needs a referral to an optometrist for eye exam. Patient scored 9 on PHQ9 Depression Scale.  Patient is currently on medication with no signs/mention of any suicidal ideations.

## 2024-04-29 NOTE — Progress Notes (Signed)
 I went in to see the patient and introduced myself to her. She was surprised she was not seeing her PCP and requested that the appointment be rescheduled with her PCP. I offered to see her today, but she declined.  The patient was handed over to CMA Thersia Meissner to help schedule the earliest appointment with her PCP this week. I will be amendable to seeing her today should she change her mind.    Message to PCP - given recurrent UTI on Farxiga , may consider holding this medication for now with follow-up with Cardiology for further recommendation.

## 2024-04-29 NOTE — Patient Instructions (Addendum)
 Donna Herrera , Thank you for taking time out of your busy schedule to complete your Annual Wellness Visit with me. I enjoyed our conversation and look forward to speaking with you again next year. I, as well as your care team,  appreciate your ongoing commitment to your health goals. Please review the following plan we discussed and let me know if I can assist you in the future. Your Game plan/ To Do List    Referrals: If you haven't heard from the office you've been referred to, please reach out to them at the phone provided.   Follow up Visits: Next Medicare AWV with our clinical staff: 05/01/2025 at 2:50 pm phone visit with Nurse Health Advisor   Have you seen your provider in the last 6 months (3 months if uncontrolled diabetes)? Yes Next Office Visit with your provider: 05/02/24 at 3:10 pm office visit  Clinician Recommendations:  Aim for 30 minutes of exercise or brisk walking, 6-8 glasses of water, and 5 servings of fruits and vegetables each day.       This is a list of the screening recommended for you and due dates:  Health Maintenance  Topic Date Due   Zoster (Shingles) Vaccine (1 of 2) Never done   COVID-19 Vaccine (6 - Pfizer risk 2024-25 season) 05/02/2024   Flu Shot  05/31/2024   Screening for Lung Cancer  12/01/2024   Medicare Annual Wellness Visit  04/29/2025   DTaP/Tdap/Td vaccine (2 - Td or Tdap) 12/14/2026   Pneumococcal Vaccine for age over 98  Completed   DEXA scan (bone density measurement)  Completed   Hepatitis C Screening  Completed   Hepatitis B Vaccine  Aged Out   HPV Vaccine  Aged Out   Meningitis B Vaccine  Aged Out   Colon Cancer Screening  Discontinued   Cologuard (Stool DNA test)  Discontinued    Advanced directives: (Declined) Advance directive discussed with you today. Even though you declined this today, please call our office should you change your mind, and we can give you the proper paperwork for you to fill out. Advance Care Planning is  important because it:  [x]  Makes sure you receive the medical care that is consistent with your values, goals, and preferences  [x]  It provides guidance to your family and loved ones and reduces their decisional burden about whether or not they are making the right decisions based on your wishes.  Follow the link provided in your after visit summary or read over the paperwork we have mailed to you to help you started getting your Advance Directives in place. If you need assistance in completing these, please reach out to us  so that we can help you!  See attachments for Preventive Care and Fall Prevention Tips.

## 2024-05-01 ENCOUNTER — Other Ambulatory Visit: Payer: Self-pay | Admitting: Family Medicine

## 2024-05-01 DIAGNOSIS — I501 Left ventricular failure: Secondary | ICD-10-CM

## 2024-05-02 ENCOUNTER — Ambulatory Visit: Admitting: Family Medicine

## 2024-05-02 ENCOUNTER — Encounter: Payer: Self-pay | Admitting: Family Medicine

## 2024-05-02 VITALS — BP 107/69 | HR 90 | Ht 62.5 in | Wt 148.4 lb

## 2024-05-02 DIAGNOSIS — N3000 Acute cystitis without hematuria: Secondary | ICD-10-CM

## 2024-05-02 DIAGNOSIS — E559 Vitamin D deficiency, unspecified: Secondary | ICD-10-CM

## 2024-05-02 DIAGNOSIS — I10 Essential (primary) hypertension: Secondary | ICD-10-CM

## 2024-05-02 DIAGNOSIS — M1A09X0 Idiopathic chronic gout, multiple sites, without tophus (tophi): Secondary | ICD-10-CM | POA: Diagnosis not present

## 2024-05-02 DIAGNOSIS — R5383 Other fatigue: Secondary | ICD-10-CM | POA: Diagnosis not present

## 2024-05-02 LAB — POCT URINALYSIS DIP (MANUAL ENTRY)
Bilirubin, UA: NEGATIVE
Glucose, UA: 100 mg/dL — AB
Ketones, POC UA: NEGATIVE mg/dL
Nitrite, UA: NEGATIVE
Spec Grav, UA: 1.015 (ref 1.010–1.025)
Urobilinogen, UA: 0.2 U/dL
pH, UA: 5 (ref 5.0–8.0)

## 2024-05-02 LAB — POCT UA - MICROSCOPIC ONLY

## 2024-05-02 MED ORDER — SULFAMETHOXAZOLE-TRIMETHOPRIM 800-160 MG PO TABS: 1.0000 | ORAL_TABLET | Freq: Two times a day (BID) | ORAL | 0 refills | Status: AC

## 2024-05-02 MED ORDER — COLCHICINE 0.6 MG PO TABS: 0.6000 mg | ORAL_TABLET | Freq: Every day | ORAL | 1 refills | Status: DC

## 2024-05-02 MED ORDER — VITAMIN D (ERGOCALCIFEROL) 1.25 MG (50000 UNIT) PO CAPS: 50000.0000 [IU] | ORAL_CAPSULE | ORAL | 0 refills | Status: DC

## 2024-05-02 NOTE — Assessment & Plan Note (Signed)
 Hx of Vit D Def. Will refill today.

## 2024-05-02 NOTE — Patient Instructions (Addendum)
 It was wonderful to see you today.  Please bring ALL of your medications with you to every visit.   Today we talked about:  Please stop taking your farxiga .  Please stop taking your losartan .  I am measuring your Vitamin B12 today.   Thank you for choosing [facility].   Please call [phone] with any questions about today's appointment.  Please arrive at least 15 minutes prior to your scheduled appointments.   If you had blood work today, I will send you a MyChart message or a letter if results are normal. Otherwise, I will give you a call.   If you had a referral placed, they will call you to set up an appointment. Please give us  a call if you don't hear back in the next 2 weeks.   If you need additional refills before your next appointment, please call your pharmacy first.   You should follow up in our clinic in Return in about 2 weeks (around 05/16/2024).  Gloriann Ogren, MD Family Medicine

## 2024-05-02 NOTE — Progress Notes (Signed)
    SUBJECTIVE:   CHIEF COMPLAINT / HPI:   Here for follow up on HTN.   Patient recently had AV node ablation with pacemaker placement. Is feeling better after this, but had some ongoing neck pain since having to use crutches after procedure. Feels she is still having this neck pain. It feels like she is still experiencing pain on the right side.   Her chronic back pain is still bothering her. She is not taking her oxycodone  as often as she should. She is still being followed at the pain clinic.   Had some low Bps when checked on at home by [facility]. Was told to stop taking BP medication but never stopped.  Is also experiencing some UTI symptoms. She was recently treated for UTI after having UA consistent with this at pain clinic. Is having some feeling of fullness and pain, but is unable to tell if pain is due to back pain or hip pain or because of the UTI. Has continued to take farxiga .    PERTINENT  PMH / PSH:  A fib (diagnosed at last hospitalization) Systolic HF HTN Chronic Pain on Opiods Gout Anxiety/Depression Neuropathy GERD  HLD   OBJECTIVE:   BP 107/69   Pulse 90   Ht 5' 2.5 (1.588 m)   Wt 148 lb 6.4 oz (67.3 kg)   SpO2 96%   BMI 26.71 kg/m   General: A&O, NAD, standing with walker  HEENT: No sign of trauma, EOM grossly intact Cardiac: RRR, no m/r/g Respiratory: CTAB, normal WOB GI: Soft, NTTP  Extremities: NTTP, no peripheral edema. Psych: Appropriate mood and affect MSK: pain with palpation about left posterior neck, muscle tightness present along L neck    ASSESSMENT/PLAN:   Assessment & Plan Vitamin D  deficiency Hx of Vit D Def. Will refill today.  Other fatigue Concerned about B12 levels. Will check today  Acute cystitis without hematuria UA consistent with UTI today. Likely ISO farxiga  use. Will treat with Abx today  - D/C farxiga  - Tx with bactrim  Essential hypertension BP appropriate today, though given low BP check with HHRN, will opt to  discontinue medication for now. - D/C losartan    Follow up in 2 weeks  Gloriann Ogren, MD [facility]

## 2024-05-02 NOTE — Assessment & Plan Note (Signed)
 BP appropriate today, though given low BP check with HHRN, will opt to discontinue medication for now. - D/C losartan 

## 2024-05-03 LAB — VITAMIN B12: Vitamin B-12: 426 pg/mL (ref 232–1245)

## 2024-05-04 ENCOUNTER — Ambulatory Visit: Payer: Self-pay | Admitting: Family Medicine

## 2024-05-07 LAB — URINE CULTURE

## 2024-05-10 ENCOUNTER — Other Ambulatory Visit: Payer: Self-pay | Admitting: Family Medicine

## 2024-05-10 DIAGNOSIS — I4891 Unspecified atrial fibrillation: Secondary | ICD-10-CM

## 2024-05-13 NOTE — Progress Notes (Signed)
 Electrophysiology Office Note:   Date:  05/21/2024  ID:  Donna Herrera, DOB 10/04/1946, MRN [id number]  Primary Cardiologist: Vina Gull, MD Primary Heart Failure: None Electrophysiologist: OLE ONEIDA HOLTS, MD       History of Present Illness:   Donna Herrera is a 78 y.o. female with h/o AF, HFrEF / NICM, HTN, HLD, GERD, tobacco abuse, chronic pain, & depression seen today for routine electrophysiology follow-up s/p CRT-P implant & subsequent AV node ablation.  Since last being seen in our clinic the patient reports she has been having neck / shoulder pain since her device was implanted. Upon further questioning she has been having shoulder issues for many years and has rotator cuff issues.    She denies chest pain, palpitations, dyspnea, PND, orthopnea, nausea, vomiting, dizziness, syncope, edema, weight gain, or early satiety.    Review of systems complete and found to be negative unless listed in HPI.   EP Information / Studies Reviewed:    EKG is ordered today. Personal review as below.  EKG Interpretation Date/Time:  Tuesday May 21 2024 16:02:02 EDT Ventricular Rate:  80 PR Interval:    QRS Duration:  116 QT Interval:  422 QTC Calculation: 486 R Axis:   90  Text Interpretation: Ventricular-paced rhythm with occasional AV dual-paced complexes Left bundle area RV lead only pacing (LV off) Confirmed by Aniceto Jarvis ([id number]) on 05/21/2024 4:06:51 PM   PPM Interrogation-  reviewed in detail today,  See PACEART report.  Device History: Abbott BiV PPM implanted 02/19/24 for Uncontrolled atrial arrhyhtmia s/p AV node ablation and AV Node Ablation 04/01/24.  Risk Assessment/Calculations:    CHA2DS2-VASc Score = 5   This indicates a 7.2% annual risk of stroke. The patient's score is based upon: CHF History: 1 HTN History: 1 Diabetes History: 0 Stroke History: 0 Vascular Disease History: 0 Age Score: 2 Gender Score: 1             Physical Exam:    VS:  BP 110/60   Pulse 92   Ht 5' 6 (1.676 m)   Wt 147 lb (66.7 kg)   SpO2 98%   BMI 23.73 kg/m    Wt Readings from Last 3 Encounters:  05/21/24 147 lb (66.7 kg)  05/02/24 148 lb 6.4 oz (67.3 kg)  04/29/24 149 lb (67.6 kg)     GEN: elderly female, well nourished, well developed in no acute distress NECK: No JVD; No carotid bruits CARDIAC: Regular rate and rhythm, no murmurs, rubs, gallops, PPM site well healed, no tethering  RESPIRATORY:  Clear to auscultation without rales, wheezing or rhonchi  ABDOMEN: Soft, non-tender, non-distended EXTREMITIES:  No edema; No deformity   ASSESSMENT AND PLAN:    Uncontrolled atrial arrhyhtmia s/p AV node ablation s/p Abbott PPM  HFrEF  -Normal PPM function -See Pace Art report -LRL reduced to 80 bpm > plan to reduce rate further at next visit in one month -99% BiV pacing  -2 EKG's obtained on visit > initial with baseline programming of RV>LV by 80ms, 2nd EKG with RV only (LBA pacing lead) without significant change in QRS duration.  Returned back to baseline programming. Will review for further optimization / programming.  -Euvolemic on exam and by device   -GDMT per Cardiology  -EKG with NSR / VP, QRS 100 ms -OAC for stroke prophylaxis  -metoprolol  50 mg BID   Secondary Hypercoagulable State  -continue Eliquis  5mg  BID, dose reviewed and appropriate by age / wt   Disposition:  Follow up with EP APP in 4 weeks   Signed, Daphne Barrack, NP-C, AGACNP-BC [facility] - Electrophysiology  05/21/2024, 5:33 PM

## 2024-05-20 ENCOUNTER — Other Ambulatory Visit: Payer: Self-pay | Admitting: Family Medicine

## 2024-05-21 ENCOUNTER — Encounter: Payer: Self-pay | Admitting: Pulmonary Disease

## 2024-05-21 ENCOUNTER — Ambulatory Visit: Attending: Cardiology | Admitting: Pulmonary Disease

## 2024-05-21 VITALS — BP 110/60 | HR 92 | Ht 66.0 in | Wt 147.0 lb

## 2024-05-21 DIAGNOSIS — Z950 Presence of cardiac pacemaker: Secondary | ICD-10-CM | POA: Diagnosis not present

## 2024-05-21 DIAGNOSIS — Z9889 Other specified postprocedural states: Secondary | ICD-10-CM | POA: Diagnosis not present

## 2024-05-21 DIAGNOSIS — I4821 Permanent atrial fibrillation: Secondary | ICD-10-CM | POA: Diagnosis not present

## 2024-05-21 DIAGNOSIS — D6869 Other thrombophilia: Secondary | ICD-10-CM

## 2024-05-21 LAB — CUP PACEART INCLINIC DEVICE CHECK
Battery Remaining Longevity: 85 mo
Battery Voltage: 2.99 V
Brady Statistic RA Percent Paced: 0 %
Brady Statistic RV Percent Paced: 99 %
Date Time Interrogation Session: 20250722174044
Implantable Lead Connection Status: 753985
Implantable Lead Connection Status: 753985
Implantable Lead Connection Status: 753985
Implantable Lead Implant Date: 20250421
Implantable Lead Implant Date: 20250421
Implantable Lead Implant Date: 20250421
Implantable Lead Location: 753858
Implantable Lead Location: 753859
Implantable Lead Location: 753860
Implantable Pulse Generator Implant Date: 20250421
Lead Channel Impedance Value: 475 Ohm
Lead Channel Impedance Value: 512.5 Ohm
Lead Channel Pacing Threshold Amplitude: 0.75 V
Lead Channel Pacing Threshold Amplitude: 0.875 V
Lead Channel Pacing Threshold Pulse Width: 0.5 ms
Lead Channel Pacing Threshold Pulse Width: 0.5 ms
Lead Channel Sensing Intrinsic Amplitude: 12 mV
Lead Channel Setting Pacing Amplitude: 2 V
Lead Channel Setting Pacing Amplitude: 2 V
Lead Channel Setting Pacing Pulse Width: 0.5 ms
Lead Channel Setting Pacing Pulse Width: 0.5 ms
Lead Channel Setting Sensing Sensitivity: 2 mV
Pulse Gen Model: 3562
Pulse Gen Serial Number: 8246599

## 2024-05-21 NOTE — Patient Instructions (Signed)
 Medication Instructions:  Your physician recommends that you continue on your current medications as directed. Please refer to the Current Medication list given to you today.  *If you need a refill on your cardiac medications before your next appointment, please call your pharmacy*  Lab Work: None ordered If you have labs (blood work) drawn today and your tests are completely normal, you will receive your results only by: MyChart Message (if you have MyChart) OR A paper copy in the mail If you have any lab test that is abnormal or we need to change your treatment, we will call you to review the results.  Follow-Up: At [facility], you and your health needs are our priority.  As part of our continuing mission to provide you with exceptional heart care, our providers are all part of one team.  This team includes your primary Cardiologist (physician) and Advanced Practice Providers or APPs (Physician Assistants and Nurse Practitioners) who all work together to provide you with the care you need, when you need it.  Your next appointment:   1 month(s)  Provider:   Daphne Barrack, NP

## 2024-05-23 ENCOUNTER — Ambulatory Visit: Payer: Self-pay | Admitting: Cardiology

## 2024-05-27 NOTE — Progress Notes (Signed)
 Remote pacemaker transmission.

## 2024-05-28 ENCOUNTER — Other Ambulatory Visit: Payer: Self-pay

## 2024-05-29 ENCOUNTER — Ambulatory Visit (HOSPITAL_COMMUNITY)
Admission: RE | Admit: 2024-05-29 | Discharge: 2024-05-29 | Disposition: A | Discharge status: Home / Self Care | Source: Ambulatory Visit | Attending: Family Medicine | Admitting: Family Medicine

## 2024-05-29 ENCOUNTER — Ambulatory Visit (INDEPENDENT_AMBULATORY_CARE_PROVIDER_SITE_OTHER): Admitting: Family Medicine

## 2024-05-29 ENCOUNTER — Other Ambulatory Visit: Payer: Self-pay

## 2024-05-29 VITALS — BP 128/72 | HR 79 | Wt 150.2 lb

## 2024-05-29 DIAGNOSIS — R0789 Other chest pain: Secondary | ICD-10-CM | POA: Diagnosis present

## 2024-05-29 DIAGNOSIS — R300 Dysuria: Secondary | ICD-10-CM

## 2024-05-29 MED ORDER — FERROUS SULFATE 325 (65 FE) MG PO TABS: 325.0000 mg | ORAL_TABLET | ORAL | 0 refills | Status: DC

## 2024-05-29 MED ORDER — ASPIRIN 325 MG PO TABS: 325.0000 mg | ORAL_TABLET | Freq: Once | ORAL | Status: DC

## 2024-05-29 MED ORDER — FOSFOMYCIN TROMETHAMINE 3 G PO PACK: 3.0000 g | PACK | Freq: Once | ORAL | 0 refills | Status: AC

## 2024-05-29 NOTE — Patient Instructions (Signed)
 It was so good to see you today! Thank you for allowing me to take care of you.  Today we discussed the following concerns and plans:  Chest pressure - please go to the emergency department immediately  UTI - fosfomycin antibiotic 1 dose - make appointment in 2 weeks to follow up  If you have any concerns, please call the clinic or schedule an appointment.  It was a pleasure to take care of you today. Be well!  Lauraine Norse, DO [facility], PGY-2  Do you need your medications delivered to your home?   We'll send your prescription to the [facility] for delivery.          Address: [address] [place], [place], [place] [address]          Phone: [phone]  Please call the [facility] to speak with a pharmacist and set up your home medication delivery. If you have any questions, feel free to contact us  -- we're happy to help!  Other [facility] [place] [facility]acies that offer affordable prices on both prescriptions and over-the-counter items, as well as convenient services like vaccinations, are  [facility], at [facility]         Address:  [address], [place], [place] [address]         Phone: [phone]  [facility], located in the [facility]        Address: [address], [place], [place] [address]        Phone: [phone]  [facility], at [facility]       Address: [address], [place], [place] [address]       Phone: [phone]  [facility], at [facility]       Address: [address], [place], [place] [address]       Phone: [phone]

## 2024-05-29 NOTE — Progress Notes (Signed)
    SUBJECTIVE:   CHIEF COMPLAINT / HPI:   UTI Today with burning with urination, belly pain, right sided back pain. Urine has been dark in color, no blood in urine. Mild odor to urine. Recently diagnosed with UTI 05/08/24 in this clinic. Prescribed course of Bactrim  which she completed. Has had frequent UTIs according to family. No history of kidney stones.  Chest cramp In clinic today while she was giving urine sample; this has never happened before. Felt like pressure, painful at the time with some difficulty catching her breath, resolved several minutes later. No SOB now. No palpiations or racing heart. Had pacemaker placed earlier this year for A fib; on eliquis  5 mg twice daily.  UTI Patient complaining of dysuria and increased urinary frequency in the last 3 days with suprapubic pain.  POCT dipstick was consistent with UTI.  Started patient on Keflex  500 mg twice daily for 5/ TID for 7  days.  PERTINENT  PMH / PSH: Reviewed. HFrEF HTN A fib COPD GERD Gout Urge incontinence  OBJECTIVE:   BP 128/72   Pulse 79   Wt 150 lb 3.2 oz (68.1 kg)   SpO2 96%   BMI 24.24 kg/m   General: frail-appearing, no acute distress. HEENT: normocephalic, PERRLA, EOM grossly intact, MMM. Cardio: Regular rate, regular rhythm, no murmurs on exam. Pulm: Clear, no wheezing, no crackles. No increased work of breathing. Abdominal: Exquisitely tender to palpation in upper and lower right quadrants. Bowel sounds present, soft, non-distended. Significant CVA tenderness bilaterally. Extremities: no peripheral edema. Moves all extremities equally. Neuro: Alert and oriented x3, speech normal in content, no facial asymmetry. Psych:  Cognition and judgment appear intact. Alert, communicative, and cooperative.   ASSESSMENT/PLAN:   Assessment & Plan Chest pressure New onset. Paced EKG with possible new T wave inversion in lateral leads. - on eliquis  BID; offered ASA today in clinic but pt  declined - strongly advised to go to ED immediately for further evaluation - encouraged EMS but pt declined and family member will take her by private vehicle. Family member verbally states comfort with this plan. - pt in agreement to go to ED; ED charge RN called and notified Dysuria Frequent despite abx treatment. Did recently discontinue farxiga  due to repeat UTIs. - UA and culture today - fosfomycin 3 g x1 - follow up in 2 weeks for repeat UA   Lauraine Norse, DO [facility]

## 2024-05-30 ENCOUNTER — Ambulatory Visit: Payer: Self-pay | Admitting: Family Medicine

## 2024-05-30 LAB — MICROSCOPIC EXAMINATION
Bacteria, UA: NONE SEEN
Casts: NONE SEEN /LPF
RBC, Urine: NONE SEEN /HPF (ref 0–2)
WBC, UA: NONE SEEN /HPF (ref 0–5)

## 2024-05-30 LAB — UA/M W/RFLX CULTURE, ROUTINE
Bilirubin, UA: NEGATIVE
Glucose, UA: NEGATIVE
Ketones, UA: NEGATIVE
Leukocytes,UA: NEGATIVE
Nitrite, UA: NEGATIVE
Protein,UA: NEGATIVE
RBC, UA: NEGATIVE
Specific Gravity, UA: 1.013 (ref 1.005–1.030)
Urobilinogen, Ur: 1 mg/dL (ref 0.2–1.0)
pH, UA: 6 (ref 5.0–7.5)

## 2024-05-30 NOTE — Telephone Encounter (Signed)
 Attempted to call pt again, and this time was able to speak with her. DOB confirmed. Discussed initial UA results which DO NOT show evidence of infection; will still send urine for culture.  Pt reported ongoing belly and back pain. I recommended follow up appt soon and offered to schedule, but patient declined.  She also shares that she did not go to the ED yesterday as discussed for concern of her new onset chest pain. She states she recovered and did not need to go. Again strongly encouraged office follow up which pt declined. Strict return precautions given and pt stated agreement.

## 2024-05-30 NOTE — Telephone Encounter (Signed)
 Attempted to call pt x2 without answer. Could not leave VM. Will attempt to contact again to discuss results.

## 2024-06-17 ENCOUNTER — Encounter: Payer: Self-pay | Admitting: *Deleted

## 2024-06-18 NOTE — Progress Notes (Signed)
 OFFICE NOTE:    Date:  06/19/2024  ID:  Yilin Weedon, DOB [birth year], MRN [id number] PCP: Lonnie Earnest, MD  [facility] Providers Cardiologist:  Vina Gull, MD Electrophysiologist:  OLE ONEIDA HOLTS, MD       (HFrEF) heart failure with reduced ejection fraction  TTE 10/2015: EF 35-40  TTE 06/06/18:  Mod LVH, EF 40-45, diff HK, Gr 1 DD, MAC, trivial MR, mild LAE, trivial TR  TTE 02/14/23: EF 50-55, no RWMA, Gr 1 DD, NL RVSF TTE 12/02/2023: EF 20-25, global HK, severely reduced RVSF, RVSP 40, mild pulmonary hypertension, moderate BAE, trivial MR, moderate TR TEE 12/04/2023: EF <20 severely reduced RVSF, severe RAE, no LAA clot, moderate MR, mild to moderate TR, trivial AI S/p CRT-P in 01/2024 Non-ischemic cardiomyopathy  Cath 05/11/16: no CAD  Persistent atrial fibrillation  Admitted 11/2023 w RVR - s/p TEE DCCV Dofetilide  Rx >> Failed Rx, stopped  S/p AV nodal ablation (03/2024), CRT-P  Hypertension  Hyperlipidemia  Tobacco use ILD (Interstitial Lung Disease)  Hx of slipped capital femoral epiphysis syndrome  S/p multiple hip surgeries   Aortic atherosclerosis       Discussed the use of AI scribe software for clinical note transcription with the patient, who gave verbal consent to proceed. History of Present Illness Donna Herrera is a 78 y.o. female who returns for follow up of CHF. Since last seen, she was evaluated by EP with Dr. HOLTS. She was taken off Dofetilide  due to Rx failure and side effects. She underwent CRT-P and subsequent AVN ablation for control of her rate in AFib.   She experienced chest pain during a primary care visit on May 29, 2024, which lasted only a few seconds and has not recurred since. She also experiences shortness of breath, particularly when warm, which started when her air conditioning broke, causing the house to become very hot. She notes orthopnea, a symptom that has returned in the past three weeks. She has neck pain,  which worsens with movement, but no pain radiating down her arms or numbness and tingling. Her neck pain sounds MSK and I asked her to follow up with primary care. She also has a lot of phlegm and has a history of smoking. She mentioned that she 'supposed' she had COPD. She was previously on Farxiga , which was stopped due to a bladder infection. She has a history of urinary tract infections over the past four to five months and notes that her stools have been dark since taking iron supplements.     Review of Systems  Gastrointestinal:  Negative for hematochezia and melena (Dark stools from Iron Rx).  -See HPI    Studies Reviewed:      03/29/2024: Na 133, K 4.8, creatinine 1.03, Hgb 13 Results LABS Total cholesterol: 146 (02/09/2024) HDL: 54 (02/09/2024) LDL: 78 (02/09/2024) Triglycerides: 70 (02/09/2024) Creatinine: 1.03 (02/09/2024) Potassium: 4.8 (02/09/2024) Hemoglobin: 13 (02/09/2024)  Risk Assessment/Calculations: CHA2DS2-VASc Score = 5   This indicates a 7.2% annual risk of stroke. The patient's score is based upon: CHF History: 1 HTN History: 1 Diabetes History: 0 Stroke History: 0 Vascular Disease History: 0 Age Score: 2 Gender Score: 1           Physical Exam:  VS:  BP 124/81   Pulse 67   Ht 5' 6 (1.676 m)   Wt 152 lb (68.9 kg)   SpO2 90%   BMI 24.53 kg/m        Wt Readings from Last 3  Encounters:  06/19/24 152 lb (68.9 kg)  05/29/24 150 lb 3.2 oz (68.1 kg)  05/21/24 147 lb (66.7 kg)    Constitutional:      Appearance: Healthy appearance. Not in distress.  Neck:     Vascular: JVR present.  Pulmonary:     Breath sounds: Normal breath sounds. No wheezing. No rales.  Cardiovascular:     Normal rate. Irregular rhythm.     Murmurs: There is no murmur.  Edema:    Peripheral edema absent.  Abdominal:     Palpations: Abdomen is soft.       Assessment and Plan:    Assessment & Plan Acute on chronic HFrEF (heart failure with reduced ejection  fraction) (HCC) Nonischemic cardiomyopathy.  EF previously improved to normal in 2024.  She had reduced EF by echocardiogram February 2025 when she was diagnosed with atrial fibrillation with rapid ventricular rate.  She had issues with hypotension and GDMT was reduced.  She used to be on losartan  and metoprolol .  She was also on Farxiga  up until recently.  This was stopped secondary to frequent UTIs.  Blood pressure is still somewhat tenuous.  I we will hold off on resuming ARB therapy at this time.  She does have symptoms of worsening volume status.  HJR is increased on exam as well.  She has noted some increased lower extremity edema. - Continue metoprolol  tartrate 50 mg twice daily - Increase Furosemide  to 40 mg twice daily x 3 days, then resume 40 mg once daily  - BMET, BNP, CBC today - Follow up 4-6 weeks. Will try to start low dose ARB at that time. - Would avoid SGLT2i due to frequent UTIs. - Once back on max tolerated GDMT, will get repeat TTE to evaluate EF. Persistent atrial fibrillation (HCC) Status post CRT-P and eventual AV nodal ablation.  She seems to be tolerating anticoagulation well.  She is on iron therapy for anemia.  Last hemoglobin in May was normal.  She has dark stools related to iron therapy. -Continue follow-up with EP as planned -Continue Eliquis  5 mg twice daily, metoprolol  tartrate 50 mg twice daily -BMET, CBC today Essential hypertension Blood pressure well-controlled on current therapy.  Continue metoprolol  tartrate 50 mg twice daily Need for follow-up by social worker Her HVAC went out several weeks ago. Her home has been extremely hot and she notes it affects her breathing. She has CHF, COPD, AFib which makes her vulnerable to heat injury. I asked our CSW to meet with her today to see if there is any assistance we can tap her into to help with getting her HVAC fixed.         Dispo:  Return in about 6 weeks (around 07/31/2024) for Routine Follow Up, w/ [name],  PA-C.  Signed, [name], PA-C

## 2024-06-19 ENCOUNTER — Encounter: Payer: Self-pay | Admitting: Physician Assistant

## 2024-06-19 ENCOUNTER — Other Ambulatory Visit: Payer: Self-pay | Admitting: Family Medicine

## 2024-06-19 ENCOUNTER — Ambulatory Visit: Attending: Cardiology | Admitting: Physician Assistant

## 2024-06-19 ENCOUNTER — Other Ambulatory Visit: Payer: Self-pay | Admitting: *Deleted

## 2024-06-19 VITALS — BP 124/81 | HR 67 | Ht 66.0 in | Wt 152.0 lb

## 2024-06-19 DIAGNOSIS — I5020 Unspecified systolic (congestive) heart failure: Secondary | ICD-10-CM

## 2024-06-19 DIAGNOSIS — I5023 Acute on chronic systolic (congestive) heart failure: Secondary | ICD-10-CM | POA: Diagnosis not present

## 2024-06-19 DIAGNOSIS — R0602 Shortness of breath: Secondary | ICD-10-CM

## 2024-06-19 DIAGNOSIS — Z789 Other specified health status: Secondary | ICD-10-CM

## 2024-06-19 DIAGNOSIS — I10 Essential (primary) hypertension: Secondary | ICD-10-CM | POA: Diagnosis not present

## 2024-06-19 DIAGNOSIS — I4819 Other persistent atrial fibrillation: Secondary | ICD-10-CM

## 2024-06-19 NOTE — Assessment & Plan Note (Signed)
 Blood pressure well-controlled on current therapy.  Continue metoprolol  tartrate 50 mg twice daily

## 2024-06-19 NOTE — Patient Instructions (Addendum)
 Medication Instructions:  Your physician has recommended you make the following change in your medication:   TAKE LASIX  20 TAKE 2 TABLETS DAILY FOR 3 DAYS THEN GO BACK TO 1 ON MONDAYS, WEDNESDAYS, & FRIDAYS  *If you need a refill on your cardiac medications before your next appointment, please call your pharmacy*  Lab Work: GO TO THE LAB ON THE 1ST FLOOR FOR:  BMET & CBC  If you have labs (blood work) drawn today and your tests are completely normal, you will receive your results only by: MyChart Message (if you have MyChart) OR A paper copy in the mail If you have any lab test that is abnormal or we need to change your treatment, we will call you to review the results.  Testing/Procedures: None ordered  Follow-Up: At [facility], you and your health needs are our priority.  As part of our continuing mission to provide you with exceptional heart care, our providers are all part of one team.  This team includes your primary Cardiologist (physician) and Advanced Practice Providers or APPs (Physician Assistants and Nurse Practitioners) who all work together to provide you with the care you need, when you need it.  Your next appointment:   4 week(s)  Provider:   [name], PA-C          We recommend signing up for the patient portal called MyChart.  Sign up information is provided on this After Visit Summary.  MyChart is used to connect with patients for Virtual Visits (Telemedicine).  Patients are able to view lab/test results, encounter notes, upcoming appointments, etc.  Non-urgent messages can be sent to your provider as well.   To learn more about what you can do with MyChart, go to [email or web address].   Other Instructions

## 2024-06-19 NOTE — Assessment & Plan Note (Signed)
 Nonischemic cardiomyopathy.  EF previously improved to normal in 2024.  She had reduced EF by echocardiogram February 2025 when she was diagnosed with atrial fibrillation with rapid ventricular rate.  She had issues with hypotension and GDMT was reduced.  She used to be on losartan  and metoprolol .  She was also on Farxiga  up until recently.  This was stopped secondary to frequent UTIs.  Blood pressure is still somewhat tenuous.  I we will hold off on resuming ARB therapy at this time.  She does have symptoms of worsening volume status.  HJR is increased on exam as well.  She has noted some increased lower extremity edema. - Continue metoprolol  tartrate 50 mg twice daily - Increase Furosemide  to 40 mg twice daily x 3 days, then resume 40 mg once daily  - BMET, BNP, CBC today - Follow up 4-6 weeks. Will try to start low dose ARB at that time. - Would avoid SGLT2i due to frequent UTIs. - Once back on max tolerated GDMT, will get repeat TTE to evaluate EF.

## 2024-06-19 NOTE — Progress Notes (Signed)
 Heart and Vascular Care Navigation  06/19/2024  Donna Herrera 27-Dec-1945 [id number]  Reason for Referral: home HVAC system in need of repair Patient is participating in a Managed Medicaid Plan: No, [facility] Medicare   Engaged with patient face to face for initial visit for Heart and Vascular Care Coordination.                                                                                                   Assessment:  LCSW met with pt and family member today at clinic. Introduced self, role, reason for visit. Pt confirmed home address, resides with family members, owns home. No noted issues with food or other costs at this time. Has active family managing her medications and assisting with care. Her HVAC system has failed and they have had at least 2 repair companies come assess repair. On average about $2000 to repair system (part+labor). We discussed that the city has several repair programs that pt may be able to apply for including [employer] and [employer]. The waitlists for these programs vary and so I am not entirely sure what the wait is at this time. We briefly discussed [facility] has assistance for programs but we are only able to assist with part of cost, pt would be responsible for other portion. We discussed getting in touch with at least one more company to ensure cost is accurate. I also will mail her these resources at home address given multiple concerns being addressed during her visit. Noted on AVS information as well for [employer] who have additional fans and may be aware of other programs our team are not.                                       HRT/VAS Care Coordination     Patients Home Cardiology Office --  [facility]   Outpatient Care Team Social Worker   Social Worker Name: Marit Lark, [place], [phone]   Living arrangements for the past 2 months Single Family Home   Lives with: Adult Children  daughter and SIL   Patient Current  Insurance Coverage Managed Medicare   Patient Has Concern With Paying Medical Bills No   Does Patient Have Prescription Coverage? Yes   Home Assistive Devices/Equipment Wheelchair   DME Agency NA   [facility] Agency [facility]   Current home services DME  crutches       Social History:                                                                             SDOH Screenings   Food Insecurity: No Food Insecurity (06/19/2024)  Housing: Low Risk  (06/19/2024)  Transportation Needs: No  Transportation Needs (06/19/2024)  Utilities: Not At Risk (06/19/2024)  Alcohol Screen: Low Risk  (04/29/2024)  Depression (PHQ2-9): Medium Risk (04/29/2024)  Financial Resource Strain: Low Risk  (04/29/2024)  Physical Activity: Insufficiently Active (04/29/2024)  Social Connections: Moderately Isolated (04/29/2024)  Stress: No Stress Concern Present (04/29/2024)  Tobacco Use: Medium Risk (06/19/2024)  Health Literacy: Adequate Health Literacy (06/19/2024)    SDOH Interventions: Financial Resources:  Pt sent resources for home repair assistance programs. Briefly discussed [facility] assistance but due to assessed repair cost we would not be able to provide full cost  Food Insecurity:  Food Insecurity Interventions: Intervention Not Indicated  Housing Insecurity:  Housing Interventions: Intervention Not Indicated  Transportation:   Transportation Interventions: Intervention Not Indicated    Other Care Navigation Interventions:     Provided Pharmacy assistance resources  No noted issues, pt family assists with management   Follow-up plan:   Mailed pt information about Psychologist, occupational program and [employer] along with my business card. Before she left office made note on AVS that I would be sending these and that I recommend if in need of any additional cooling fans to contact [facility] and gave address/phone number. Will f/u to ensure resources received. There may be  additional options as well for [facility] unit assistance if full system is not able to be repaired.

## 2024-06-20 ENCOUNTER — Ambulatory Visit: Payer: Self-pay | Admitting: Physician Assistant

## 2024-06-20 DIAGNOSIS — Z79899 Other long term (current) drug therapy: Secondary | ICD-10-CM

## 2024-06-21 LAB — CBC
Hematocrit: 40.9 % (ref 34.0–46.6)
Hemoglobin: 12.7 g/dL (ref 11.1–15.9)
MCH: 28.6 pg (ref 26.6–33.0)
MCHC: 31.1 g/dL — ABNORMAL LOW (ref 31.5–35.7)
MCV: 92 fL (ref 79–97)
Platelets: 196 10*3/uL (ref 150–450)
RBC: 4.44 x10E6/uL (ref 3.77–5.28)
RDW: 13.8 % (ref 11.7–15.4)
WBC: 5.9 10*3/uL (ref 3.4–10.8)

## 2024-06-21 LAB — BASIC METABOLIC PANEL WITH GFR
BUN/Creatinine Ratio: 14 (ref 12–28)
BUN: 11 mg/dL (ref 8–27)
CO2: 16 mmol/L — ABNORMAL LOW (ref 20–29)
Calcium: 9.6 mg/dL (ref 8.7–10.3)
Chloride: 99 mmol/L (ref 96–106)
Creatinine, Ser: 0.81 mg/dL (ref 0.57–1.00)
Glucose: 103 mg/dL — ABNORMAL HIGH (ref 70–99)
Potassium: 4.8 mmol/L (ref 3.5–5.2)
Sodium: 138 mmol/L (ref 134–144)
eGFR: 74 mL/min/{1.73_m2}

## 2024-06-21 LAB — PRO B NATRIURETIC PEPTIDE: NT-Pro BNP: 8357 pg/mL — AB (ref 0–738)

## 2024-06-21 MED ORDER — POTASSIUM CHLORIDE CRYS ER 20 MEQ PO TBCR: 20.0000 meq | EXTENDED_RELEASE_TABLET | Freq: Every day | ORAL | 3 refills | Status: DC

## 2024-06-21 MED ORDER — FUROSEMIDE 20 MG PO TABS: ORAL_TABLET | ORAL | 3 refills | Status: DC

## 2024-06-23 NOTE — Progress Notes (Unsigned)
  Electrophysiology Office Note:   Date:  06/24/2024  ID:  Donna Herrera, DOB 28-Mar-1946, MRN [id number]  Primary Cardiologist: Vina Gull, MD Primary Heart Failure: None Electrophysiologist: OLE ONEIDA HOLTS, MD       History of Present Illness:   Donna Herrera is a 78 y.o. female with h/o AF, HFrEF / NICM, HTN, HLD, GERD, tobacco abuse, chronic pain, & depression seen today for routine electrophysiology followup.   Recent CRT-P and AV node ablation.  Seen in EP Clinic 05/21/24 and had neck/shoulder pain since device was implanted. She had baseline ortho issues that significantly limited her shoulder mobility. Rate was reduced at that visit to 80 bpm.  Since last being seen in our clinic the patient reports doing well. She continues to have issues with her bilateral shoulders. She was seen by [name] on 06/19/24 and lasix  dosing was increased.    She denies chest pain, palpitations, dyspnea, PND, orthopnea, nausea, vomiting, dizziness, syncope, edema, weight gain, or early satiety.   Review of systems complete and found to be negative unless listed in HPI.    EP Information / Studies Reviewed:    EKG is not ordered today. EKG from 05/21/24 reviewed which showed VP with occ dual AV paced complexes, 80 bpm      PPM Interrogation-  reviewed in detail today,  See PACEART report.  Device History: Abbott BiV PPM implanted 04/01/24 for Uncontrolled atrial arrhyhtmia s/p AV node ablation  Risk Assessment/Calculations:    CHA2DS2-VASc Score = 5   This indicates a 7.2% annual risk of stroke. The patient's score is based upon: CHF History: 1 HTN History: 1 Diabetes History: 0 Stroke History: 0 Vascular Disease History: 0 Age Score: 2 Gender Score: 1             Physical Exam:   VS:  BP 112/82   Pulse (!) 106   Ht 5' 6 (1.676 m)   Wt 152 lb (68.9 kg) Comment: Patient reported  SpO2 94%   BMI 24.53 kg/m    Wt Readings from Last 3 Encounters:  06/24/24  152 lb (68.9 kg)  06/19/24 152 lb (68.9 kg)  05/29/24 150 lb 3.2 oz (68.1 kg)     GEN: Well nourished, well developed in no acute distress NECK: No JVD; No carotid bruits CARDIAC: Regular rate and rhythm (VP), no murmurs, rubs, gallops RESPIRATORY:  Clear to auscultation without rales, wheezing or rhonchi  ABDOMEN: Soft, non-tender, non-distended EXTREMITIES:  1-2+ BLE edema; No deformity   ASSESSMENT AND PLAN:    Uncontrolled atrial arrhyhtmia s/p AV node ablation s/p Abbott PPM  HFrEF  -Normal PPM function -92% BiV pacing  -GDMT per Cardiology  -See [name] report -No changes today -reduce LRL to 70 bpm from 80 bpm post AV Node ablation  -OAC for stroke prophylaxis  -metoprolol  50 mg BID  -CorVue suggestive of fluid accumulation but is going back down / headed in right direction on device  Secondary Hypercoagulable State  -continue Eliquis  5mg  BID, dose reviewed and appropriate by age / wt    Disposition:   Follow up with Dr. HOLTS in 12 months  Signed, Daphne Barrack, NP-C, AGACNP-BC [facility] - Electrophysiology  06/24/2024, 4:49 PM

## 2024-06-24 ENCOUNTER — Ambulatory Visit: Attending: Pulmonary Disease | Admitting: Pulmonary Disease

## 2024-06-24 ENCOUNTER — Encounter: Payer: Self-pay | Admitting: Pulmonary Disease

## 2024-06-24 VITALS — BP 112/82 | HR 106 | Ht 66.0 in | Wt 152.0 lb

## 2024-06-24 DIAGNOSIS — Z9889 Other specified postprocedural states: Secondary | ICD-10-CM

## 2024-06-24 DIAGNOSIS — I4821 Permanent atrial fibrillation: Secondary | ICD-10-CM

## 2024-06-24 DIAGNOSIS — Z950 Presence of cardiac pacemaker: Secondary | ICD-10-CM | POA: Diagnosis not present

## 2024-06-24 DIAGNOSIS — D6869 Other thrombophilia: Secondary | ICD-10-CM

## 2024-06-24 DIAGNOSIS — I5023 Acute on chronic systolic (congestive) heart failure: Secondary | ICD-10-CM | POA: Diagnosis not present

## 2024-06-24 LAB — CUP PACEART INCLINIC DEVICE CHECK
Date Time Interrogation Session: 20250825164238
Implantable Lead Connection Status: 753985
Implantable Lead Connection Status: 753985
Implantable Lead Connection Status: 753985
Implantable Lead Implant Date: 20250421
Implantable Lead Implant Date: 20250421
Implantable Lead Implant Date: 20250421
Implantable Lead Location: 753858
Implantable Lead Location: 753859
Implantable Lead Location: 753860
Implantable Pulse Generator Implant Date: 20250421
Pulse Gen Model: 3562
Pulse Gen Serial Number: 8246599

## 2024-06-24 NOTE — Patient Instructions (Signed)
 Medication Instructions:  Your physician recommends that you continue on your current medications as directed. Please refer to the Current Medication list given to you today.  *If you need a refill on your cardiac medications before your next appointment, please call your pharmacy*  Lab Work: None ordered If you have labs (blood work) drawn today and your tests are completely normal, you will receive your results only by: MyChart Message (if you have MyChart) OR A paper copy in the mail If you have any lab test that is abnormal or we need to change your treatment, we will call you to review the results.  Follow-Up: At [facility], you and your health needs are our priority.  As part of our continuing mission to provide you with exceptional heart care, our providers are all part of one team.  This team includes your primary Cardiologist (physician) and Advanced Practice Providers or APPs (Physician Assistants and Nurse Practitioners) who all work together to provide you with the care you need, when you need it.  Your next appointment:   1 year(s)  Provider:   Ole Holts, MD or Daphne Barrack, NP

## 2024-06-25 LAB — BASIC METABOLIC PANEL WITH GFR
BUN/Creatinine Ratio: 16 (ref 12–28)
BUN: 12 mg/dL (ref 8–27)
CO2: 21 mmol/L (ref 20–29)
Calcium: 9.4 mg/dL (ref 8.7–10.3)
Chloride: 96 mmol/L (ref 96–106)
Creatinine, Ser: 0.74 mg/dL (ref 0.57–1.00)
Glucose: 85 mg/dL (ref 70–99)
Potassium: 4.1 mmol/L (ref 3.5–5.2)
Sodium: 132 mmol/L — ABNORMAL LOW (ref 134–144)
eGFR: 83 mL/min/1.73 (ref >59)

## 2024-06-26 ENCOUNTER — Ambulatory Visit: Payer: Self-pay | Admitting: Cardiology

## 2024-06-28 ENCOUNTER — Telehealth (HOSPITAL_BASED_OUTPATIENT_CLINIC_OR_DEPARTMENT_OTHER): Payer: Self-pay | Admitting: Licensed Clinical Social Worker

## 2024-06-28 NOTE — Telephone Encounter (Signed)
 H&V Care Navigation CSW Progress Note  Clinical Social Worker contacted patient by phone to f/u on resources for home repairs. Was able to reach her at [phone]. Re-introduced self, role, reason for call. Confirmed she received resources mailed, hasn't had a chance to review them/contact local groups for assistance. Is okay with me calling back to check in. Encouraged her to call me back as needed during this time as well.  Patient is participating in a Managed Medicaid Plan:  No, UHC Medicare dual complete  SDOH Screenings   Food Insecurity: No Food Insecurity (06/19/2024)  Housing: Low Risk  (06/19/2024)  Transportation Needs: No Transportation Needs (06/19/2024)  Utilities: Not At Risk (06/19/2024)  Alcohol Screen: Low Risk  (04/29/2024)  Depression (PHQ2-9): Medium Risk (04/29/2024)  Financial Resource Strain: Low Risk  (04/29/2024)  Physical Activity: Insufficiently Active (04/29/2024)  Social Connections: Moderately Isolated (04/29/2024)  Stress: No Stress Concern Present (04/29/2024)  Tobacco Use: Medium Risk (06/24/2024)  Health Literacy: Adequate Health Literacy (06/19/2024)    Donna Herrera, MSW, LCSW Clinical Social Worker II [facility] Heart/Vascular Care Navigation  [phone]- work cell phone (preferred)

## 2024-07-03 ENCOUNTER — Ambulatory Visit (INDEPENDENT_AMBULATORY_CARE_PROVIDER_SITE_OTHER)

## 2024-07-03 DIAGNOSIS — I4821 Permanent atrial fibrillation: Secondary | ICD-10-CM | POA: Diagnosis not present

## 2024-07-04 ENCOUNTER — Encounter: Payer: Self-pay | Admitting: *Deleted

## 2024-07-04 LAB — CUP PACEART REMOTE DEVICE CHECK
Battery Remaining Longevity: 95 mo
Battery Remaining Percentage: 95.5 %
Battery Voltage: 3.01 V
Date Time Interrogation Session: 20250903032522
Implantable Lead Connection Status: 753985
Implantable Lead Connection Status: 753985
Implantable Lead Connection Status: 753985
Implantable Lead Implant Date: 20250421
Implantable Lead Implant Date: 20250421
Implantable Lead Implant Date: 20250421
Implantable Lead Location: 753858
Implantable Lead Location: 753859
Implantable Lead Location: 753860
Implantable Pulse Generator Implant Date: 20250421
Lead Channel Impedance Value: 460 Ohm
Lead Channel Impedance Value: 480 Ohm
Lead Channel Pacing Threshold Amplitude: 0.75 V
Lead Channel Pacing Threshold Amplitude: 1.125 V
Lead Channel Pacing Threshold Pulse Width: 0.5 ms
Lead Channel Pacing Threshold Pulse Width: 0.5 ms
Lead Channel Sensing Intrinsic Amplitude: 11 mV
Lead Channel Setting Pacing Amplitude: 2 V
Lead Channel Setting Pacing Amplitude: 2.125
Lead Channel Setting Pacing Pulse Width: 0.5 ms
Lead Channel Setting Pacing Pulse Width: 0.5 ms
Lead Channel Setting Sensing Sensitivity: 2 mV
Pulse Gen Model: 3562
Pulse Gen Serial Number: 8246599

## 2024-07-06 ENCOUNTER — Ambulatory Visit: Payer: Self-pay | Admitting: Cardiology

## 2024-07-07 ENCOUNTER — Other Ambulatory Visit: Payer: Self-pay | Admitting: Family Medicine

## 2024-07-07 DIAGNOSIS — M1A09X0 Idiopathic chronic gout, multiple sites, without tophus (tophi): Secondary | ICD-10-CM

## 2024-07-08 ENCOUNTER — Ambulatory Visit: Attending: Physician Assistant | Admitting: Physician Assistant

## 2024-07-08 ENCOUNTER — Encounter: Payer: Self-pay | Admitting: Physician Assistant

## 2024-07-08 VITALS — BP 108/78 | HR 68 | Resp 16 | Ht 66.0 in | Wt 152.0 lb

## 2024-07-08 DIAGNOSIS — R0602 Shortness of breath: Secondary | ICD-10-CM

## 2024-07-08 DIAGNOSIS — I10 Essential (primary) hypertension: Secondary | ICD-10-CM | POA: Diagnosis not present

## 2024-07-08 DIAGNOSIS — I4821 Permanent atrial fibrillation: Secondary | ICD-10-CM

## 2024-07-08 DIAGNOSIS — I5020 Unspecified systolic (congestive) heart failure: Secondary | ICD-10-CM | POA: Diagnosis not present

## 2024-07-08 NOTE — Assessment & Plan Note (Signed)
 As noted, low blood pressure limits GDMT.

## 2024-07-08 NOTE — Progress Notes (Signed)
 OFFICE NOTE:    Date:  07/08/2024  ID:  Donna Herrera, DOB [birth year], MRN [id number] PCP: Lonnie Earnest, MD  [facility] Providers Cardiologist:  Vina Gull, MD Electrophysiologist:  OLE ONEIDA HOLTS, MD        (HFrEF) heart failure with reduced ejection fraction  TTE 10/2015: EF 35-40  TTE 06/06/18:  Mod LVH, EF 40-45, diff HK, Gr 1 DD, MAC, trivial MR, mild LAE, trivial TR  TTE 02/14/23: EF 50-55, no RWMA, Gr 1 DD, NL RVSF TTE 12/02/2023: EF 20-25, global HK, severely reduced RVSF, RVSP 40, mild pulmonary hypertension, moderate BAE, trivial MR, moderate TR TEE 12/04/2023: EF <20 severely reduced RVSF, severe RAE, no LAA clot, moderate MR, mild to moderate TR, trivial AI S/p CRT-P in 01/2024 GDMT [x] Beta-blocker:  Metoprolol  tartrate [] ACE/ARB/ARNI:  Entresto  stopped due to low BP / intol of Losartan  (joint pain), stopped due to low BP / intol of ACE 2/2 cough [] SGLT2i:  intol due to UTIs [] MRA:   [] Hydralazine :  stopped in past due to low BP Non-ischemic cardiomyopathy  Cath 05/11/16: no CAD  Persistent atrial fibrillation  Admitted 11/2023 w RVR - s/p TEE DCCV Dofetilide  Rx >> Failed Rx, stopped  S/p AV nodal ablation (03/2024), CRT-P  Hypertension  Hyperlipidemia  Tobacco use ILD (Interstitial Lung Disease)  Hx of slipped capital femoral epiphysis syndrome  S/p multiple hip surgeries   Aortic atherosclerosis       Discussed the use of AI scribe software for clinical note transcription with the patient, who gave verbal consent to proceed. History of Present Illness Donna Herrera is a 78 y.o. female who returns for follow up of CHF. She was last seen 06/19/24. She was more short of breath. NT-Pro BNP was significantly elevated (8,357). I adjusted her furosemide . GDMT had been stopped around the time of her CRT implant and AVN ablation due to low BP. She was previously on Losartan  and Metoprolol . SGLT2i had been DC'd due to frequent UTIs.   She  reports that she has not had any recent episodes of shortness of breath since the increase in Lasix . Her weight has remained stable at 152-153 pounds, and her legs have been swollen but have decreased in size recently. She was previously on losartan , which was stopped due to low blood pressure, and an SGLT2 inhibitor was discontinued due to frequent UTIs. She recalls joint pain associated with losartan  and a cough with lisinopril . She is currently on 40 mg of Lasix  daily and has been on spironolactone  in the past without issues. She experiences difficulty breathing when lying flat, describing a 'stuffy' feeling in her chest, which sometimes improves by elevating her head. Her daughter mentions concerns about her frequent need to urinate due to Lasix . She has slipped capital femoral epiphysis and uses crutches and a wheelchair. It is difficult for her to get up quickly, especially when alone.   ROS-See HPI    Studies Reviewed:      Recent Labs    06/19/24 1308 06/24/24 1458  K 4.8 4.1  BUN 11 12  CREATININE 0.81 0.74  EGFR 74 83  PROBNP 8,357*  --     Risk Assessment/Calculations:  CHA2DS2-VASc Score = 5   This indicates a 7.2% annual risk of stroke. The patient's score is based upon: CHF History: 1 HTN History: 1 Diabetes History: 0 Stroke History: 0 Vascular Disease History: 0 Age Score: 2 Gender Score: 1           Physical Exam:  VS:  BP 108/78 (BP Location: Left Arm, Patient Position: Sitting, Cuff Size: Normal)   Pulse 68   Resp 16   Ht 5' 6 (1.676 m)   Wt 152 lb (68.9 kg)   SpO2 94%   BMI 24.53 kg/m        Wt Readings from Last 3 Encounters:  07/08/24 152 lb (68.9 kg)  06/24/24 152 lb (68.9 kg)  06/19/24 152 lb (68.9 kg)    Constitutional:      Appearance: Healthy appearance. Not in distress.  Neck:     Vascular: No JVR.  Pulmonary:     Breath sounds: Normal breath sounds. No wheezing. No rales.  Cardiovascular:     Normal rate. Regular rhythm.      Murmurs: There is no murmur.  Edema:    Peripheral edema present.    Ankle: bilateral trace edema of the ankle. Abdominal:     Palpations: Abdomen is soft.       Assessment and Plan:    Assessment & Plan HFrEF (heart failure with reduced ejection fraction) (HCC) Nonischemic cardiomyopathy.  EF previously improved to normal in 2024.  She had reduced EF by echocardiogram February 2025 when she was diagnosed with atrial fibrillation with rapid ventricular rate.  She had issues with hypotension and GDMT was reduced.  Farxiga  was stopped secondary to frequent UTIs.  Entresto  was stopped several years ago secondary to hypotension.  She had a cough with lisinopril .  She also had joint pain with losartan .  She was on hydralazine  for a time as well as spironolactone .  She was started back on losartan  she was hospitalized in January with atrial fibrillation.  This was stopped it was stopped due to hypotension.  When last seen, she was volume overloaded.  BNP was significantly elevated.  Furosemide  was increased. Device interrogation at OV 06/24/24 showed Corevue (thoracic impedance) was trending down (improved).  She still has some volume to lose.  She has difficulty getting up due to her chronic hip problems.  She also has low blood pressure which limits GDMT. -Continue furosemide  40 mg daily, potassium 20 mEq daily -Continue metoprolol  tartrate 50 mg twice daily -Obtain BMET, BNP today -Consider adding low-dose spironolactone  based upon results -Follow-up 6-8 weeks -Consider obtaining repeat echocardiogram once she is on maximally tolerated GDMT Permanent atrial fibrillation (HCC) Status post CRT-P and eventual AV nodal ablation.  -Continue metoprolol  tartrate 50 mg twice daily, Eliquis  5 mg twice daily -Follow up with the EP as planned Essential hypertension As noted, low blood pressure limits GDMT.        Dispo:  Return in about 8 weeks (around 09/02/2024) for Routine Follow Up, w/ Dr. Okey, or  [name], PA-C.  Signed, [name], PA-C

## 2024-07-08 NOTE — Addendum Note (Signed)
 Addended by: Floride Hutmacher L on: 07/08/2024 03:14 PM   Modules accepted: Orders

## 2024-07-08 NOTE — Assessment & Plan Note (Addendum)
 Nonischemic cardiomyopathy.  EF previously improved to normal in 2024.  She had reduced EF by echocardiogram February 2025 when she was diagnosed with atrial fibrillation with rapid ventricular rate.  She had issues with hypotension and GDMT was reduced.  Farxiga  was stopped secondary to frequent UTIs.  Entresto  was stopped several years ago secondary to hypotension.  She had a cough with lisinopril .  She also had joint pain with losartan .  She was on hydralazine  for a time as well as spironolactone .  She was started back on losartan  she was hospitalized in January with atrial fibrillation.  This was stopped it was stopped due to hypotension.  When last seen, she was volume overloaded.  BNP was significantly elevated.  Furosemide  was increased. Device interrogation at OV 06/24/24 showed Corevue (thoracic impedance) was trending down (improved).  She still has some volume to lose.  She has difficulty getting up due to her chronic hip problems.  She also has low blood pressure which limits GDMT. -Continue furosemide  40 mg daily, potassium 20 mEq daily -Continue metoprolol  tartrate 50 mg twice daily -Obtain BMET, BNP today -Consider adding low-dose spironolactone  based upon results -Follow-up 6-8 weeks -Consider obtaining repeat echocardiogram once she is on maximally tolerated GDMT

## 2024-07-08 NOTE — Telephone Encounter (Signed)
 Chart reviewed. Rx refilled.

## 2024-07-08 NOTE — Patient Instructions (Signed)
 Medication Instructions:  No medication changes were made during today's visit.  *If you need a refill on your cardiac medications before your next appointment, please call your pharmacy*   Lab Work: Labs will  be drawn today....... BNP, BMET If you have labs (blood work) drawn today and your tests are completely normal, you will receive your results only by: MyChart Message (if you have MyChart) OR A paper copy in the mail If you have any lab test that is abnormal or we need to change your treatment, we will call you to review the results.   Testing/Procedures: No procedures were ordered during today's visit.   Your next appointment:   6-8 week(s)   Provider:   Vina Gull, MD or [name], PA    Follow-Up: At [facility], you and your health needs are our priority.  As part of our continuing mission to provide you with exceptional heart care, we have created designated Provider Care Teams.  These Care Teams include your primary Cardiologist (physician) and Advanced Practice Providers (APPs -  Physician Assistants and Nurse Practitioners) who all work together to provide you with the care you need, when you need it. We recommend signing up for the patient portal called MyChart.  Sign up information is provided on this After Visit Summary.  MyChart is used to connect with patients for Virtual Visits (Telemedicine).  Patients are able to view lab/test results, encounter notes, upcoming appointments, etc.  Non-urgent messages can be sent to your provider as well.   To learn more about what you can do with MyChart, go to [email or web address].

## 2024-07-08 NOTE — Assessment & Plan Note (Signed)
 Status post CRT-P and eventual AV nodal ablation.  -Continue metoprolol  tartrate 50 mg twice daily, Eliquis  5 mg twice daily -Follow up with the EP as planned

## 2024-07-09 ENCOUNTER — Ambulatory Visit: Payer: Self-pay | Admitting: Physician Assistant

## 2024-07-09 DIAGNOSIS — R0602 Shortness of breath: Secondary | ICD-10-CM

## 2024-07-09 DIAGNOSIS — I5020 Unspecified systolic (congestive) heart failure: Secondary | ICD-10-CM

## 2024-07-09 LAB — BASIC METABOLIC PANEL WITH GFR
BUN/Creatinine Ratio: 17 (ref 12–28)
BUN: 11 mg/dL (ref 8–27)
CO2: 19 mmol/L — ABNORMAL LOW (ref 20–29)
Calcium: 9.4 mg/dL (ref 8.7–10.3)
Chloride: 95 mmol/L — ABNORMAL LOW (ref 96–106)
Creatinine, Ser: 0.64 mg/dL (ref 0.57–1.00)
Glucose: 83 mg/dL (ref 70–99)
Potassium: 4.3 mmol/L (ref 3.5–5.2)
Sodium: 135 mmol/L (ref 134–144)
eGFR: 90 mL/min/1.73 (ref >59)

## 2024-07-09 LAB — BRAIN NATRIURETIC PEPTIDE: BNP: 1661 pg/mL — ABNORMAL HIGH (ref 0.0–100.0)

## 2024-07-11 MED ORDER — SPIRONOLACTONE 25 MG PO TABS: 12.5000 mg | ORAL_TABLET | Freq: Every day | ORAL | 1 refills | Status: DC

## 2024-07-11 MED ORDER — POTASSIUM CHLORIDE CRYS ER 20 MEQ PO TBCR: 10.0000 meq | EXTENDED_RELEASE_TABLET | Freq: Every day | ORAL | 1 refills | Status: DC

## 2024-07-11 NOTE — Telephone Encounter (Signed)
 Please call grand-daughter back and give her the instructions

## 2024-07-13 NOTE — Progress Notes (Signed)
 Remote PPM Transmission

## 2024-07-15 ENCOUNTER — Telehealth: Payer: Self-pay | Admitting: Internal Medicine

## 2024-07-15 NOTE — Telephone Encounter (Signed)
 Spoke with patient and reviewed recent medication changes.  Per [name], PA-C based on recent labs results: - Start Spironolactone  25 mg take 1/2 (12.5 mg) once daily  - Decrease potassium to 20 mEq take 1/2 (10 mEq) once daily  - Come to the lab in 1 week for repeat labs.  Patient plans to pick up spironolactone  today and will have labs drawn this week.  Patient verbalized understanding of the above and expressed appreciation for call.

## 2024-07-15 NOTE — Telephone Encounter (Signed)
 Left message to return call

## 2024-07-15 NOTE — Telephone Encounter (Signed)
 Pt granddaughter calling back

## 2024-07-15 NOTE — Telephone Encounter (Signed)
 Pt c/o medication issue:  1. Name of Medication:   potassium chloride  SA (KLOR-CON  M) 20 MEQ tablet    2. How are you currently taking this medication (dosage and times per day)? Take 0.5 tablets (10 mEq total) by mouth daily.   3. Are you having a reaction (difficulty breathing--STAT)? No  4. What is your medication issue? Pts granddaughter calling to ask what dosage of potassium pt should be taking. Please advise.

## 2024-07-19 ENCOUNTER — Ambulatory Visit: Admitting: Physician Assistant

## 2024-07-20 ENCOUNTER — Other Ambulatory Visit: Payer: Self-pay | Admitting: Family Medicine

## 2024-07-22 ENCOUNTER — Telehealth (HOSPITAL_BASED_OUTPATIENT_CLINIC_OR_DEPARTMENT_OTHER): Payer: Self-pay | Admitting: Licensed Clinical Social Worker

## 2024-07-22 NOTE — Telephone Encounter (Signed)
 H&V Care Navigation CSW Progress Note  Clinical Social Worker contacted patient by phone to f/u on community resources for HVAC repair pt shares she has been able to complete this repair with financial assistance from family. No other concerns other than some neck/back pain that she was encouraged to f/u with PCP regarding. Remain available as needed moving forward.  Patient is participating in a Managed Medicaid Plan:  No, UHC Medicare only  SDOH Screenings   Food Insecurity: No Food Insecurity (06/19/2024)  Housing: Low Risk  (06/19/2024)  Transportation Needs: No Transportation Needs (06/19/2024)  Utilities: Not At Risk (06/19/2024)  Alcohol Screen: Low Risk  (04/29/2024)  Depression (PHQ2-9): Medium Risk (04/29/2024)  Financial Resource Strain: Low Risk  (04/29/2024)  Physical Activity: Insufficiently Active (04/29/2024)  Social Connections: Moderately Isolated (04/29/2024)  Stress: No Stress Concern Present (04/29/2024)  Tobacco Use: Medium Risk (07/08/2024)  Health Literacy: Adequate Health Literacy (06/19/2024)    Marit Lark, MSW, LCSW Clinical Social Worker II [facility] Heart/Vascular Care Navigation  [phone]- work cell phone (preferred)

## 2024-07-30 ENCOUNTER — Other Ambulatory Visit: Payer: Self-pay | Admitting: *Deleted

## 2024-07-30 DIAGNOSIS — M47896 Other spondylosis, lumbar region: Secondary | ICD-10-CM | POA: Diagnosis not present

## 2024-07-30 DIAGNOSIS — M1A09X0 Idiopathic chronic gout, multiple sites, without tophus (tophi): Secondary | ICD-10-CM

## 2024-07-30 DIAGNOSIS — Z79899 Other long term (current) drug therapy: Secondary | ICD-10-CM | POA: Diagnosis not present

## 2024-07-30 DIAGNOSIS — E559 Vitamin D deficiency, unspecified: Secondary | ICD-10-CM | POA: Diagnosis not present

## 2024-07-30 DIAGNOSIS — E7800 Pure hypercholesterolemia, unspecified: Secondary | ICD-10-CM | POA: Diagnosis not present

## 2024-07-30 DIAGNOSIS — Z9181 History of falling: Secondary | ICD-10-CM | POA: Diagnosis not present

## 2024-07-30 DIAGNOSIS — M25551 Pain in right hip: Secondary | ICD-10-CM | POA: Diagnosis not present

## 2024-07-30 DIAGNOSIS — I4819 Other persistent atrial fibrillation: Secondary | ICD-10-CM | POA: Diagnosis not present

## 2024-07-30 DIAGNOSIS — R7303 Prediabetes: Secondary | ICD-10-CM | POA: Diagnosis not present

## 2024-07-30 DIAGNOSIS — R0602 Shortness of breath: Secondary | ICD-10-CM | POA: Diagnosis not present

## 2024-07-30 DIAGNOSIS — I10 Essential (primary) hypertension: Secondary | ICD-10-CM | POA: Diagnosis not present

## 2024-07-30 MED ORDER — ALLOPURINOL 100 MG PO TABS: 100.0000 mg | ORAL_TABLET | Freq: Every day | ORAL | 2 refills | Status: AC

## 2024-08-01 ENCOUNTER — Ambulatory Visit (INDEPENDENT_AMBULATORY_CARE_PROVIDER_SITE_OTHER): Admitting: Family Medicine

## 2024-08-01 VITALS — BP 117/83 | HR 70 | Wt 155.2 lb

## 2024-08-01 DIAGNOSIS — Z23 Encounter for immunization: Secondary | ICD-10-CM

## 2024-08-01 DIAGNOSIS — I501 Left ventricular failure: Secondary | ICD-10-CM

## 2024-08-01 DIAGNOSIS — M545 Low back pain, unspecified: Secondary | ICD-10-CM

## 2024-08-01 DIAGNOSIS — I5020 Unspecified systolic (congestive) heart failure: Secondary | ICD-10-CM | POA: Diagnosis not present

## 2024-08-01 DIAGNOSIS — G8929 Other chronic pain: Secondary | ICD-10-CM | POA: Diagnosis not present

## 2024-08-01 NOTE — Assessment & Plan Note (Signed)
 Patient appears volume overloaded today with bilateral lower extremity edema up to her knees along with bibasilar crackles on physical exam.  Patient denies feeling shortness of breath, however collateral from family member reports she has been slower to walk.  Attempted to obtain ambulatory pulse ox, however was unable to obtain.  Patient was instructed by cardiology to start spironolactone  12.5 mg, however as she was unable to get lab work drawn, she was advised to stop this medication.  She reports that she did not know if she was to stop this medication and has been taking it.  Will attempt to obtain lab work today.  Given unable to obtain pulse ox and patient's volume status, discussed ED precautions.  Patient would like to avoid ED at this time.  Called cardiology office to schedule appointment for patient. - Continue current regimen as prescribed by cardiology, stop spironolactone  - ED precautions provided - Appointment scheduled with cardiology for tomorrow - BNP and BMP ordered

## 2024-08-01 NOTE — Assessment & Plan Note (Signed)
 Nonischemic cardiomyopathy.  EF previously improved to normal in 2024.  She had reduced EF by echocardiogram February 2025 when she was diagnosed with atrial fibrillation with rapid ventricular rate.  She had issues with hypotension and GDMT was reduced.  Farxiga  was stopped secondary to frequent UTIs.  Entresto  was stopped several years ago secondary to hypotension.  She had a cough with lisinopril .  She also had joint pain with losartan .  She was on hydralazine  for a time as well as spironolactone .  She was started back on losartan  she was hospitalized in January with atrial fibrillation.  This was stopped it was stopped due to hypotension.  At last visit, I restarted Spironolactone . She has been to the lab several times and they were unable to draw her blood. She saw primary care yesterday and was felt to be volume overloaded. Her edema is likely multifactorial. She seems to have a component of venous insufficiency in addition to congestive heart failure. Her breathing is stable. She has not had orthopnea. Her lungs are clear on exam. I am hesitant to adjust her medications any further unless we can get follow up labs. It has been several mos since her CRT-P was implanted. I would like to reassess her LVF with an echocardiogram.  -We were able to draw her blood in our office today>>BMET today -Echocardiogram  -Continue Furosemide  40 mg once daily, K+ 10 mEq once daily, Spironolactone  12.5 mg once daily  -Follow up 2-3 weeks -Compression hose daily to help with edema

## 2024-08-01 NOTE — Patient Instructions (Addendum)
 It was wonderful to see you today.  Please bring ALL of your medications with you to every visit.   Today we talked about:  STOP Spironolactone  25 mg take 1/2 (12.5 mg) once daily  Increase potassium back to 20 mEq once daily   Call the cardiologist ASAP to get seen.   Thank you for choosing [facility].   Please call [phone] with any questions about today's appointment.  Please arrive at least 15 minutes prior to your scheduled appointments.   If you had blood work today, I will send you a MyChart message or a letter if results are normal. Otherwise, I will give you a call.   If you had a referral placed, they will call you to set up an appointment. Please give us  a call if you don't hear back in the next 2 weeks.   If you need additional refills before your next appointment, please call your pharmacy first.   Do you need your medications delivered to your home?   We'll send your prescription to the [facility] for delivery.          Address: [address] [place], [place], [place] [address]          Phone: [phone]  Please call the [facility] to speak with a pharmacist and set up your home medication delivery. If you have any questions, feel free to contact us  -- we're happy to help!  Other [facility] that offer affordable prices on both prescriptions and over-the-counter items, as well as convenient services like vaccinations, are  [facility], at [facility]         Address:  [address], [place], [place] [address]         Phone: [phone]  [facility], located in the [facility]        Address: [address], [place], [place] [address]        Phone: [phone]  [facility], at [facility]       Address: [address], [place], [place] [address]       Phone: [phone]  [facility],  at [facility]       Address: [address], [address] Floor, [place], [place] [address]       Phone: [phone]  You should follow up in our clinic in Return in about 4 weeks (around 08/29/2024).  Gloriann Ogren, MD Family Medicine

## 2024-08-01 NOTE — Progress Notes (Signed)
 OFFICE NOTE:    Date:  08/02/2024  ID:  Avelina Brew, DOB [birth year], MRN [id number] PCP: Lonnie Earnest, MD  [facility] Providers Cardiologist:  Vina Gull, MD Electrophysiologist:  OLE ONEIDA HOLTS, MD        (HFrEF) heart failure with reduced ejection fraction  TTE 10/2015: EF 35-40  TTE 06/06/18:  Mod LVH, EF 40-45, diff HK, Gr 1 DD, MAC, trivial MR, mild LAE, trivial TR  TTE 02/14/23: EF 50-55, no RWMA, Gr 1 DD, NL RVSF TTE 12/02/2023: EF 20-25, global HK, severely reduced RVSF, RVSP 40, mild pulmonary hypertension, moderate BAE, trivial MR, moderate TR TEE 12/04/2023: EF <20 severely reduced RVSF, severe RAE, no LAA clot, moderate MR, mild to moderate TR, trivial AI S/p CRT-P in 01/2024 GDMT [x] Beta-blocker:  Metoprolol  tartrate [] ACE/ARB/ARNI:  Entresto  stopped due to low BP / intol of Losartan  (joint pain), stopped due to low BP / intol of ACE 2/2 cough [] SGLT2i:  intol due to UTIs [] MRA:   [] Hydralazine :  stopped in past due to low BP Non-ischemic cardiomyopathy  Cath 05/11/16: no CAD  Persistent atrial fibrillation  Admitted 11/2023 w RVR - s/p TEE DCCV Dofetilide  Rx >> Failed Rx, stopped  S/p AV nodal ablation (03/2024), CRT-P  Hypertension  Hyperlipidemia  Tobacco use ILD (Interstitial Lung Disease)  Hx of slipped capital femoral epiphysis syndrome  S/p multiple hip surgeries   Aortic atherosclerosis       Discussed the use of AI scribe software for clinical note transcription with the patient, who gave verbal consent to proceed. History of Present Illness Donna Herrera is a 78 y.o. female who returns for follow up of CHF. She was last seen 07/08/24. She was previously on losartan , which was stopped due to low blood pressure, and an SGLT2 inhibitor was discontinued due to frequent UTIs. She recalls joint pain associated with losartan  and a cough with lisinopril . Her NT Pro BNP was elevated at last visit. I started her back on Spironlactone.  We could not get her to returns for follow up labs. She saw her PCP 10/2 and was felt to be volume overloaded. She is seen now for further evaluation.   She has experienced a weight gain of 3-4 pounds over the last week, with her current weight at 155 pounds. No increased shortness of breath, chest pain, or dizziness. She has frequent urination and leg swelling that improves with elevation but does not resolve completely. She reports difficulty with blood draws, as she has been unable to obtain blood from various sites on her arms.  She has not been using compression socks recently.    ROS-See HPI     Studies Reviewed:       07/08/24: K 4.3, Cr 0.64, BNP 1661  Risk Assessment/Calculations: CHA2DS2-VASc Score = 5   This indicates a 7.2% annual risk of stroke. The patient's score is based upon: CHF History: 1 HTN History: 1 Diabetes History: 0 Stroke History: 0 Vascular Disease History: 0 Age Score: 2 Gender Score: 1           Physical Exam:  VS:  BP 111/75 (BP Location: Left Arm, Patient Position: Sitting)   Pulse 65   Ht 5' 6 (1.676 m)   Wt 155 lb (70.3 kg)   SpO2 95%   BMI 25.02 kg/m        Wt Readings from Last 3 Encounters:  08/02/24 155 lb (70.3 kg)  08/01/24 155 lb 3.2 oz (70.4 kg)  07/08/24 152 lb (  68.9 kg)    Constitutional:      Appearance: Healthy appearance. Not in distress.  Neck:     Vascular: JVD elevated.  Pulmonary:     Effort: Pulmonary effort is normal.     Breath sounds: No wheezing. No rales.  Cardiovascular:     Normal rate. Regular rhythm.     Murmurs: There is no murmur.  Edema:    Peripheral edema present.    Pretibial: bilateral 2+ edema of the pretibial area.    Ankle: bilateral 2+ edema of the ankle. Abdominal:     General: There is no distension.     Palpations: Abdomen is soft.  Skin:    General: Skin is warm and dry.       Assessment and Plan:    Assessment & Plan HFrEF (heart failure with reduced ejection fraction)  (HCC) Nonischemic cardiomyopathy.  EF previously improved to normal in 2024.  She had reduced EF by echocardiogram February 2025 when she was diagnosed with atrial fibrillation with rapid ventricular rate.  She had issues with hypotension and GDMT was reduced.  Farxiga  was stopped secondary to frequent UTIs.  Entresto  was stopped several years ago secondary to hypotension.  She had a cough with lisinopril .  She also had joint pain with losartan .  She was on hydralazine  for a time as well as spironolactone .  She was started back on losartan  she was hospitalized in January with atrial fibrillation.  This was stopped it was stopped due to hypotension.  At last visit, I restarted Spironolactone . She has been to the lab several times and they were unable to draw her blood. She saw primary care yesterday and was felt to be volume overloaded. Her edema is likely multifactorial. She seems to have a component of venous insufficiency in addition to congestive heart failure. Her breathing is stable. She has not had orthopnea. Her lungs are clear on exam. I am hesitant to adjust her medications any further unless we can get follow up labs. It has been several mos since her CRT-P was implanted. I would like to reassess her LVF with an echocardiogram.  -We were able to draw her blood in our office today>>BMET today -Echocardiogram  -Continue Furosemide  40 mg once daily, K+ 10 mEq once daily, Spironolactone  12.5 mg once daily  -Follow up 2-3 weeks -Compression hose daily to help with edema Permanent atrial fibrillation (HCC) Status post CRT-P and eventual AV nodal ablation.  -Continue Eliquis  5 mg twice daily  -Echocardiogram          Dispo:  Return in about 2 weeks (around 08/16/2024) for Close Follow Up, w/ [name], PA-C.  Signed, [name], PA-C

## 2024-08-01 NOTE — Progress Notes (Signed)
    SUBJECTIVE:   CHIEF COMPLAINT / HPI:   Lump on back Reports back tightness and feeling like a knot on her right lower back, unsure of exact location. Report tender touch. Denies any new weakness or loss of sensation to back or lower extremities, denies any fecal or urinary incontinence.   Fluid on legs Patient reports persistent bilateral edema since last visit to cardiology, 07/08/24. Denies any shortness of breath, though family member reports she takes longer to walk. Denies any chest pain. Cardiology recommended initiating spironolactone  but were unable to get lab work so asked her to discontinue. She has been taking 12.5 mg of the spironolactone  as she was unaware she should have stopped.  - no shortness of breath - no chest pain     PERTINENT  PMH / PSH:    OBJECTIVE:   BP 117/83   Pulse 70   Wt 155 lb 3.2 oz (70.4 kg)   SpO2 100%   BMI 25.05 kg/m   General: A&O, NAD HEENT: No sign of trauma, EOM grossly intact Cardiac: RRR, no m/r/g  Respiratory: mild bibasilar crackles, no wheezing heard on exam, normal WOB on RA  GI: Soft, NTTP, non-distended  Extremities: TTP along left lumbar paraspinal musculature, no masses felt  Neuro: Stiff gait with assistance of walker  Psych: Appropriate mood and affect   ASSESSMENT/PLAN:   Assessment & Plan HFrEF (heart failure with reduced ejection fraction) (HCC) Patient appears volume overloaded today with bilateral lower extremity edema up to her knees along with bibasilar crackles on physical exam.  Patient denies feeling shortness of breath, however collateral from family member reports she has been slower to walk.  Attempted to obtain ambulatory pulse ox, however was unable to obtain.  Patient was instructed by cardiology to start spironolactone  12.5 mg, however as she was unable to get lab work drawn, she was advised to stop this medication.  She reports that she did not know if she was to stop this medication and has been taking  it.  Will attempt to obtain lab work today.  Given unable to obtain pulse ox and patient's volume status, discussed ED precautions.  Patient would like to avoid ED at this time.  Called cardiology office to schedule appointment for patient. - Continue current regimen as prescribed by cardiology, stop spironolactone  - ED precautions provided - Appointment scheduled with cardiology for tomorrow - BNP and BMP ordered  Chronic right-sided low back pain, unspecified whether sciatica present Patient has history of chronic pain, complaining of a lump on her back.  Upon palpation, appears to be musculature in nature.  Patient denies any changes in sensation or neurological loss.  Advised on supportive measures.  Gloriann Ogren, MD [facility]

## 2024-08-01 NOTE — Assessment & Plan Note (Signed)
 Status post CRT-P and eventual AV nodal ablation. ***

## 2024-08-02 ENCOUNTER — Encounter: Payer: Self-pay | Admitting: Physician Assistant

## 2024-08-02 ENCOUNTER — Ambulatory Visit: Attending: Physician Assistant | Admitting: Physician Assistant

## 2024-08-02 VITALS — BP 111/75 | HR 65 | Ht 66.0 in | Wt 155.0 lb

## 2024-08-02 DIAGNOSIS — I4821 Permanent atrial fibrillation: Secondary | ICD-10-CM

## 2024-08-02 DIAGNOSIS — I5020 Unspecified systolic (congestive) heart failure: Secondary | ICD-10-CM

## 2024-08-02 NOTE — Patient Instructions (Signed)
 Medication Instructions:  Your physician recommends that you continue on your current medications as directed. Please refer to the Current Medication list given to you today.  *If you need a refill on your cardiac medications before your next appointment, please call your pharmacy*  Lab Work: BMET-TODAY If you have labs (blood work) drawn today and your tests are completely normal, you will receive your results only by: MyChart Message (if you have MyChart) OR A paper copy in the mail If you have any lab test that is abnormal or we need to change your treatment, we will call you to review the results.  Testing/Procedures: Your physician has requested that you have an echocardiogram. Echocardiography is a painless test that uses sound waves to create images of your heart. It provides your doctor with information about the size and shape of your heart and how well your heart's chambers and valves are working. This procedure takes approximately one hour. There are no restrictions for this procedure. Please do NOT wear cologne, perfume, aftershave, or lotions (deodorant is allowed). Please arrive 15 minutes prior to your appointment time.  Please note: We ask at that you not bring children with you during ultrasound (echo/ vascular) testing. Due to room size and safety concerns, children are not allowed in the ultrasound rooms during exams. Our front office staff cannot provide observation of children in our lobby area while testing is being conducted. An adult accompanying a patient to their appointment will only be allowed in the ultrasound room at the discretion of the ultrasound technician under special circumstances. We apologize for any inconvenience.   Follow-Up: At [facility], you and your health needs are our priority.  As part of our continuing mission to provide you with exceptional heart care, our providers are all part of one team.  This team includes your primary Cardiologist  (physician) and Advanced Practice Providers or APPs (Physician Assistants and Nurse Practitioners) who all work together to provide you with the care you need, when you need it.  Your next appointment:   2-3 week(s)  Provider:   [name], PA-C         1.Wear knee high compression hose 2.Keep your legs elevated when sitting

## 2024-08-03 LAB — BASIC METABOLIC PANEL WITH GFR
BUN/Creatinine Ratio: 17 (ref 12–28)
BUN: 12 mg/dL (ref 8–27)
CO2: 20 mmol/L (ref 20–29)
Calcium: 9.2 mg/dL (ref 8.7–10.3)
Chloride: 92 mmol/L — ABNORMAL LOW (ref 96–106)
Creatinine, Ser: 0.72 mg/dL (ref 0.57–1.00)
Glucose: 90 mg/dL (ref 70–99)
Potassium: 3.6 mmol/L (ref 3.5–5.2)
Sodium: 130 mmol/L — ABNORMAL LOW (ref 134–144)
eGFR: 86 mL/min/1.73 (ref >59)

## 2024-08-04 ENCOUNTER — Ambulatory Visit: Payer: Self-pay | Admitting: Physician Assistant

## 2024-08-04 DIAGNOSIS — I10 Essential (primary) hypertension: Secondary | ICD-10-CM

## 2024-08-07 ENCOUNTER — Telehealth: Payer: Self-pay | Admitting: Physician Assistant

## 2024-08-07 DIAGNOSIS — I10 Essential (primary) hypertension: Secondary | ICD-10-CM

## 2024-08-07 NOTE — Telephone Encounter (Signed)
 Called patient and gave her Donna Herrera, [place] recommendations to increase furosemide  to 60 mg Mon, Wed, Fridays and other days to take 40 mg. Made pt aware to increase spirolactone 25 mg daily and Bmet in one week . Patient and caregiver verbalized an understanding. Advise pt to reduce sodium, elevate legs, purchase some ted hose and put on in am and take off at bedtime. Understanding verbalized

## 2024-08-07 NOTE — Progress Notes (Signed)
 Called patient back to speak to patient's daughter and go over results and recommendations patient stated no one was home yet. Will try again at a later time

## 2024-08-07 NOTE — Progress Notes (Signed)
 Called patient, she stated that her daughter will be home in about 20 minutes.

## 2024-08-07 NOTE — Telephone Encounter (Signed)
 Pt c/o swelling/edema: STAT if pt has developed SOB within 24 hours  If swelling, where is the swelling located? Legs and feet   How much weight have you gained and in what time span? yes  Have you gained 2 pounds in a day or 5 pounds in a week? yes  Do you have a log of your daily weights (if so, list)? no  Are you currently taking a fluid pill? yes  Are you currently SOB? no  Have you traveled recently in a car or plane for an extended period of time? No   Pt's caregiver Bobbette Pouch called in regarding labs and wanted clarification on next steps. Please Advise

## 2024-08-12 ENCOUNTER — Other Ambulatory Visit: Payer: Self-pay | Admitting: Family Medicine

## 2024-08-12 DIAGNOSIS — M25551 Pain in right hip: Secondary | ICD-10-CM

## 2024-08-14 ENCOUNTER — Other Ambulatory Visit: Payer: Self-pay | Admitting: Family Medicine

## 2024-08-14 DIAGNOSIS — I4891 Unspecified atrial fibrillation: Secondary | ICD-10-CM

## 2024-08-15 ENCOUNTER — Telehealth: Payer: Self-pay | Admitting: Physician Assistant

## 2024-08-15 NOTE — Telephone Encounter (Signed)
 Pt c/o swelling/edema: STAT if pt has developed SOB within 24 hours  If swelling, where is the swelling located? Legs   How much weight have you gained and in what time span? Not sure   Have you gained 2 pounds in a day or 5 pounds in a week? Not sure   Do you have a log of your daily weights (if so, list)? Does not have it at the moment, she is on the way back home now   Are you currently taking a fluid pill? She states she is taking furosemide  60mg  MWF and 40mg  on the other days. And she is still taking spironolactone  25mg  daily   Are you currently SOB? Only when she is laying down   Have you traveled recently in a car or plane for an extended period of time? No

## 2024-08-15 NOTE — Telephone Encounter (Signed)
Left message and call back number.

## 2024-08-28 DIAGNOSIS — I4819 Other persistent atrial fibrillation: Secondary | ICD-10-CM | POA: Diagnosis not present

## 2024-08-28 DIAGNOSIS — R7303 Prediabetes: Secondary | ICD-10-CM | POA: Diagnosis not present

## 2024-08-28 DIAGNOSIS — E7800 Pure hypercholesterolemia, unspecified: Secondary | ICD-10-CM | POA: Diagnosis not present

## 2024-08-28 DIAGNOSIS — M7989 Other specified soft tissue disorders: Secondary | ICD-10-CM | POA: Diagnosis not present

## 2024-08-28 DIAGNOSIS — E559 Vitamin D deficiency, unspecified: Secondary | ICD-10-CM | POA: Diagnosis not present

## 2024-08-28 DIAGNOSIS — Z9181 History of falling: Secondary | ICD-10-CM | POA: Diagnosis not present

## 2024-08-28 DIAGNOSIS — M47896 Other spondylosis, lumbar region: Secondary | ICD-10-CM | POA: Diagnosis not present

## 2024-08-28 DIAGNOSIS — I10 Essential (primary) hypertension: Secondary | ICD-10-CM | POA: Diagnosis not present

## 2024-08-28 DIAGNOSIS — M25551 Pain in right hip: Secondary | ICD-10-CM | POA: Diagnosis not present

## 2024-08-28 DIAGNOSIS — Z79899 Other long term (current) drug therapy: Secondary | ICD-10-CM | POA: Diagnosis not present

## 2024-08-29 ENCOUNTER — Emergency Department (HOSPITAL_COMMUNITY)

## 2024-08-29 ENCOUNTER — Other Ambulatory Visit: Payer: Self-pay

## 2024-08-29 ENCOUNTER — Encounter (HOSPITAL_COMMUNITY): Payer: Self-pay

## 2024-08-29 ENCOUNTER — Observation Stay (HOSPITAL_COMMUNITY)
Admission: EM | Admit: 2024-08-29 | Discharge: 2024-09-01 | Disposition: A | Discharge status: Home / Self Care | Attending: Family Medicine | Admitting: Family Medicine

## 2024-08-29 DIAGNOSIS — N179 Acute kidney failure, unspecified: Secondary | ICD-10-CM | POA: Diagnosis present

## 2024-08-29 DIAGNOSIS — Z79899 Other long term (current) drug therapy: Secondary | ICD-10-CM | POA: Insufficient documentation

## 2024-08-29 DIAGNOSIS — R0602 Shortness of breath: Secondary | ICD-10-CM | POA: Diagnosis present

## 2024-08-29 DIAGNOSIS — I5023 Acute on chronic systolic (congestive) heart failure: Secondary | ICD-10-CM

## 2024-08-29 DIAGNOSIS — R1011 Right upper quadrant pain: Secondary | ICD-10-CM | POA: Insufficient documentation

## 2024-08-29 DIAGNOSIS — E785 Hyperlipidemia, unspecified: Secondary | ICD-10-CM | POA: Insufficient documentation

## 2024-08-29 DIAGNOSIS — I110 Hypertensive heart disease with heart failure: Secondary | ICD-10-CM | POA: Insufficient documentation

## 2024-08-29 DIAGNOSIS — I509 Heart failure, unspecified: Principal | ICD-10-CM

## 2024-08-29 DIAGNOSIS — J449 Chronic obstructive pulmonary disease, unspecified: Secondary | ICD-10-CM | POA: Diagnosis not present

## 2024-08-29 DIAGNOSIS — Z789 Other specified health status: Secondary | ICD-10-CM

## 2024-08-29 DIAGNOSIS — K219 Gastro-esophageal reflux disease without esophagitis: Secondary | ICD-10-CM | POA: Insufficient documentation

## 2024-08-29 DIAGNOSIS — E871 Hypo-osmolality and hyponatremia: Secondary | ICD-10-CM | POA: Diagnosis not present

## 2024-08-29 DIAGNOSIS — Z7901 Long term (current) use of anticoagulants: Secondary | ICD-10-CM | POA: Insufficient documentation

## 2024-08-29 DIAGNOSIS — I5020 Unspecified systolic (congestive) heart failure: Principal | ICD-10-CM | POA: Insufficient documentation

## 2024-08-29 DIAGNOSIS — I4891 Unspecified atrial fibrillation: Secondary | ICD-10-CM | POA: Diagnosis not present

## 2024-08-29 DIAGNOSIS — E559 Vitamin D deficiency, unspecified: Secondary | ICD-10-CM

## 2024-08-29 HISTORY — DX: Heart failure, unspecified: I50.9

## 2024-08-29 HISTORY — DX: Right upper quadrant pain: R10.11

## 2024-08-29 LAB — CBC
HCT: 37.9 % (ref 36.0–46.0)
Hemoglobin: 12.1 g/dL (ref 12.0–15.0)
MCH: 28.1 pg (ref 26.0–34.0)
MCHC: 31.9 g/dL (ref 30.0–36.0)
MCV: 88.1 fL (ref 80.0–100.0)
Platelets: 191 K/uL (ref 150–400)
RBC: 4.3 MIL/uL (ref 3.87–5.11)
RDW: 15.4 % (ref 11.5–15.5)
WBC: 4.8 K/uL (ref 4.0–10.5)
nRBC: 0 % (ref 0.0–0.2)

## 2024-08-29 LAB — BASIC METABOLIC PANEL WITH GFR
Anion gap: 11 (ref 5–15)
BUN: 20 mg/dL (ref 8–23)
CO2: 19 mmol/L — ABNORMAL LOW (ref 22–32)
Calcium: 8.8 mg/dL — ABNORMAL LOW (ref 8.9–10.3)
Chloride: 95 mmol/L — ABNORMAL LOW (ref 98–111)
Creatinine, Ser: 1.11 mg/dL — ABNORMAL HIGH (ref 0.44–1.00)
GFR, Estimated: 51 mL/min — ABNORMAL LOW (ref >60)
Glucose, Bld: 164 mg/dL — ABNORMAL HIGH (ref 70–99)
Potassium: 4.7 mmol/L (ref 3.5–5.1)
Sodium: 125 mmol/L — ABNORMAL LOW (ref 135–145)

## 2024-08-29 LAB — COMPREHENSIVE METABOLIC PANEL WITH GFR
ALT: 15 U/L (ref 0–44)
AST: 29 U/L (ref 15–41)
Albumin: 3.8 g/dL (ref 3.5–5.0)
Alkaline Phosphatase: 94 U/L (ref 38–126)
Anion gap: 15 (ref 5–15)
BUN: 20 mg/dL (ref 8–23)
CO2: 23 mmol/L (ref 22–32)
Calcium: 9.2 mg/dL (ref 8.9–10.3)
Chloride: 93 mmol/L — ABNORMAL LOW (ref 98–111)
Creatinine, Ser: 1.08 mg/dL — ABNORMAL HIGH (ref 0.44–1.00)
GFR, Estimated: 53 mL/min — ABNORMAL LOW (ref >60)
Glucose, Bld: 96 mg/dL (ref 70–99)
Potassium: 4 mmol/L (ref 3.5–5.1)
Sodium: 131 mmol/L — ABNORMAL LOW (ref 135–145)
Total Bilirubin: 1 mg/dL (ref 0.0–1.2)
Total Protein: 7.5 g/dL (ref 6.5–8.1)

## 2024-08-29 LAB — TROPONIN I (HIGH SENSITIVITY)
Troponin I (High Sensitivity): 18 ng/L — ABNORMAL HIGH (ref <18)
Troponin I (High Sensitivity): 21 ng/L — ABNORMAL HIGH (ref <18)

## 2024-08-29 LAB — BRAIN NATRIURETIC PEPTIDE: B Natriuretic Peptide: 2583.7 pg/mL — ABNORMAL HIGH (ref 0.0–100.0)

## 2024-08-29 MED ORDER — OXYCODONE HCL 5 MG PO TABS
5.0000 mg | ORAL_TABLET | Freq: Four times a day (QID) | ORAL | Status: DC | PRN
  Administered 2024-08-29 – 2024-08-31 (×3): 5 mg via ORAL
  Filled 2024-08-29 (×3): qty 1

## 2024-08-29 MED ORDER — POLYETHYLENE GLYCOL 3350 17 G PO PACK: 17.0000 g | PACK | Freq: Every day | ORAL | Status: DC | PRN

## 2024-08-29 MED ORDER — SPIRONOLACTONE 12.5 MG HALF TABLET
12.5000 mg | ORAL_TABLET | Freq: Every day | ORAL | Status: DC
  Administered 2024-08-30 – 2024-09-01 (×3): 12.5 mg via ORAL
  Filled 2024-08-29 (×3): qty 1

## 2024-08-29 MED ORDER — METOPROLOL TARTRATE 50 MG PO TABS
50.0000 mg | ORAL_TABLET | Freq: Two times a day (BID) | ORAL | Status: DC
  Administered 2024-08-29 – 2024-09-01 (×6): 50 mg via ORAL
  Filled 2024-08-29 (×6): qty 1

## 2024-08-29 MED ORDER — FUROSEMIDE 10 MG/ML IJ SOLN
40.0000 mg | Freq: Once | INTRAMUSCULAR | Status: AC
  Administered 2024-08-29: 40 mg via INTRAVENOUS
  Filled 2024-08-29: qty 4

## 2024-08-29 MED ORDER — ADULT MULTIVITAMIN W/MINERALS CH
1.0000 | ORAL_TABLET | Freq: Every day | ORAL | Status: DC
  Administered 2024-08-29 – 2024-09-01 (×4): 1 via ORAL
  Filled 2024-08-29 (×4): qty 1

## 2024-08-29 MED ORDER — EZETIMIBE 10 MG PO TABS
10.0000 mg | ORAL_TABLET | Freq: Every day | ORAL | Status: DC
Start: 2024-08-29 — End: 2024-09-01
  Administered 2024-08-29 – 2024-09-01 (×4): 10 mg via ORAL
  Filled 2024-08-29 (×4): qty 1

## 2024-08-29 MED ORDER — ALBUTEROL SULFATE (2.5 MG/3ML) 0.083% IN NEBU: 2.5000 mg | INHALATION_SOLUTION | RESPIRATORY_TRACT | Status: DC | PRN

## 2024-08-29 MED ORDER — FAMOTIDINE 20 MG PO TABS: 20.0000 mg | ORAL_TABLET | Freq: Every day | ORAL | Status: DC | PRN

## 2024-08-29 MED ORDER — APIXABAN 5 MG PO TABS
5.0000 mg | ORAL_TABLET | Freq: Two times a day (BID) | ORAL | Status: DC
  Administered 2024-08-29 – 2024-09-01 (×6): 5 mg via ORAL
  Filled 2024-08-29 (×6): qty 1

## 2024-08-29 MED ORDER — ACETAMINOPHEN 500 MG PO TABS: 1000.0000 mg | ORAL_TABLET | Freq: Four times a day (QID) | ORAL | Status: DC | PRN

## 2024-08-29 MED ORDER — ALLOPURINOL 100 MG PO TABS
100.0000 mg | ORAL_TABLET | Freq: Every day | ORAL | Status: DC
Start: 2024-08-29 — End: 2024-09-01
  Administered 2024-08-29 – 2024-09-01 (×4): 100 mg via ORAL
  Filled 2024-08-29 (×4): qty 1

## 2024-08-29 MED ORDER — DULOXETINE HCL 30 MG PO CPEP
30.0000 mg | ORAL_CAPSULE | Freq: Every day | ORAL | Status: DC
  Administered 2024-08-29 – 2024-09-01 (×4): 30 mg via ORAL
  Filled 2024-08-29 (×4): qty 1

## 2024-08-29 NOTE — Assessment & Plan Note (Signed)
 Creatinine baseline for the last year appears to be around 0.8 with a creatinine of 1.11 on admission, suspect due to cardiorenal syndrome type I.  Also hypervolemic hyponatremia with a sodium of 125 that may be indicative of extent of her HFrEF. - AM CMP, magnesium  - Bladder scan - Urinalysis - Diuresis as above

## 2024-08-29 NOTE — ED Triage Notes (Signed)
 QUICK TRIAGE: Pt to ER with c/o shortness of breath for the last several days.  Denies cough or congestion.  Able to speak in complete uninterrupted sentences.

## 2024-08-29 NOTE — H&P (Addendum)
 Hospital Admission History and Physical Service Pager: [phone]  Patient name: Donna Herrera Medical record number: [id number] Date of Birth: 07/06/1946 Age: 78 y.o. Gender: female  Primary Care Provider: Lonnie Earnest, MD Consultants: None Code Status: DNR/DNI Preferred Emergency Contact: Patient would not specify, but said any of below are acceptable contacts Contact Information     Name Relation Home Work Mobile   [place] Granddaughter [phone]  [phone]   Elna Eans Daughter   [phone]   MACARIO BOERS Other   [phone]      Other Contacts   None on File     Chief Complaint: Shortness of breath   Assessment and Plan: Donna Herrera is a 78 y.o. female with PMH HFrEF, A-fib with pacemaker, COPD, HLD, GERD presenting with acute shortness of breath, orthopnea, and lower extremity edema with elevated BNP and volume overload on exam concerning for a CHF exacerbation.  COPD exacerbation less likely given patient's lack of cough and minimal wheezing on exam, and this would not explain her volume overload. Wells score is 0, and I do not suspect a pulmonary embolism.  Given patient's report of chest tightness, I will further rule out ACS; however, her EKG was without ST abnormalities.   Assessment & Plan CHF exacerbation (HCC) She has been following with [facility] and taking her heart failure/diuresis medications as prescribed, but reports she has not been urinating as much for the last several days.  Last EF in February 20 to 25%.  She has had difficulty tolerating previous GDMT given hypotension. -Admit to FM TS, Pray, telemetry - Status post 40 mg IV Lasix  - Complete echo ordered - Strict I's and O's - Daily weights -Continuous pulse ox - SpO2 goal 88 to 92%, given history of COPD - Continue Lopressor  50 mg twice daily - Continue spironolactone  12.5 mg twice daily -AM CMP, magnesium  - troponin ordered for chest tightness, EKG  without signs of ST elevations  AKI (acute kidney injury) Hyponatremia Creatinine baseline for the last year appears to be around 0.8 with a creatinine of 1.11 on admission, suspect due to cardiorenal syndrome type I.  Also hypervolemic hyponatremia with a sodium of 125 that may be indicative of extent of her HFrEF. - AM CMP, magnesium  - Bladder scan - Urinalysis - Diuresis as above Right upper quadrant pain Patient did not report this as part of history but had some right upper quadrant tenderness on palpation. No N/V/D.  - AM CMP  Chronic health problem A-fib: Has pacemaker, on Eliquis  COPD: As needed albuterol  nebulizers HLD: Continue Zetia  GERD: Pepcid  20 mg daily as needed Pain management: Tylenol  at 1000 mg every 6 hours as needed, continuing home oxycodone  5 mg every 6 hours as needed, continuing home Cymbalta  30 mg daily, per dispense reports she has not filled gabapentin  in many months so this is not restarted today  FEN/GI: heart healthy, 1.5 L fluid restriction VTE Prophylaxis: eliquis   Disposition: telemetry  History of Present Illness:  Donna Herrera is a 78 y.o. female presenting with acute shortness of breath, lower extremity swelling for the past 3 to 4 days.  Patient notes that she has been peeing more but no abdominal fullness, pain.  Does endorse some chest tightness with shortness of breath. Positive orthopnea.  No dysuria.  She has not missed any doses of her medications, except this morning.  No recent fevers, sick symptoms, sick contacts.  No consistent cough.  Last bowel movement was yesterday.   In the ED, she  was satting well on room air.  Chest x-ray showed stable vascular cardiomegaly and possible vascular congestion, per ED MD report.  BNP significantly elevated above last at 2583.  40 mg IV Lasix  administered.  Hyponatremic to 125.  Review Of Systems: Per HPI  Pertinent Past Medical History: CVD, A-fib, HFrEF, GERD Remainder reviewed in history  tab.   Pertinent Past Surgical History:  Remainder reviewed in history tab.   Pertinent Social History: Tobacco use: Former  Pertinent Family History:  Remainder reviewed in history tab.   Important Outpatient Medications: Patient uncertain of the medication she is taking, but reports she takes them as scheduled.  Last dose last night  Furosemide  40 mg every day Spironolactone  12.5 mg every day Eliquis  5 mg BID Metoprolol  50 mg BID Cymbalta  30 mg every day  Zetia  10 mg every day  Allopurinol  100 mg every day   Remainder reviewed in medication history.   Objective: BP (!) 130/92 (BP Location: Left Arm)   Pulse 69   Temp 98.2 F (36.8 C) (Oral)   Resp 17   SpO2 98%  Exam: General: Well-appearing frail woman lying in bed, edentulous Cardiovascular: RRR, no murmurs, 2+ radial pulses, JVD to the mid neck, no hepatojugular reflux, 3+ pitting edema to the mid shins, trace edema in the thighs Respiratory: Breathing comfortably on room air, good air movement to the lung bases, no crackles, faint occasional wheeze Gastrointestinal: Tenderness to palpation of the right upper quadrant, abdomen soft, no suprapubic pain or fullness  Labs:  CBC BMET  Recent Labs  Lab 08/29/24 1238  WBC 4.8  HGB 12.1  HCT 37.9  PLT 191   Recent Labs  Lab 08/29/24 1238  NA 125*  K 4.7  CL 95*  CO2 19*  BUN 20  CREATININE 1.11*  GLUCOSE 164*  CALCIUM  8.8*      BNP 2583  EKG: Normal sinus rhythm   Imaging Studies Performed:  DG Chest 2 View Result Date: 08/29/2024 EXAM: 2 VIEW(S) XRAY OF THE CHEST 08/29/2024 01:01:00 PM COMPARISON: 02/20/2024 CLINICAL HISTORY: sob sob FINDINGS: LINES, TUBES AND DEVICES: Stable left-sided pacemaker. LUNGS AND PLEURA: No focal pulmonary opacity. No pulmonary edema. No pleural effusion. No pneumothorax. HEART AND MEDIASTINUM: Stable cardiomegaly. BONES AND SOFT TISSUES: Severe degenerative changes seen involving both shoulders. IMPRESSION: 1. Stable  cardiomegaly with a left-sided pacemaker in place. 2. Severe bilateral shoulder degenerative changes. Electronically signed by: Lynwood Seip MD 08/29/2024 01:23 PM EDT RP Workstation: [id number]    Alena Morrison, [place], MD 08/29/2024, 3:52 PM PGY-1, [facility]  FPTS Intern pager: [phone], text pages welcome Secure chat group [facility] Teaching Service

## 2024-08-29 NOTE — Hospital Course (Addendum)
 Donna Herrera is a 78 y.o.female with a history of HFrEF, Afib with CRT-P on eliquis , COPD, Pulmonary fibrosis, CAD who was admitted to the [facility] Medicine Teaching Service at [facility] for shortness of breath. Her hospital course is detailed below:  CHF exacerbation Presented with dyspnea and volume overload. She was stable on room air. BNP was 2500 in the ED. Updated echo revealed EF <20%. Patient responded well to diuresis and was clinically stable discharged home on Lasix  40 mg BID.  AKI Presented with elevated Creatinine and low GFR that continued to worsen as admission progressed. Cardiology was consulted and recommenced discharge on Lasix  40 mg BID with cardiology follow-up, stating that renal function should continue to improve on current diuretic regimen.    Other chronic conditions were medically managed with home medications and formulary alternatives as necessary (A-fib, chronic pain)  PCP Follow-up Recommendations: Ensure cardiology follow-up Please med rec as there are some discrepancies between inpatient med rec per patient report and cardiology dosage recommendations (specifically lasix  and metoprolol )  ?

## 2024-08-29 NOTE — Assessment & Plan Note (Signed)
 Patient did not report this as part of history but had some right upper quadrant tenderness on palpation. No N/V/D.  - AM CMP

## 2024-08-29 NOTE — Assessment & Plan Note (Signed)
 A-fib: Has pacemaker, on Eliquis  COPD: As needed albuterol  nebulizers HLD: Continue Zetia  GERD: Pepcid  20 mg daily as needed Pain management: Tylenol  at 1000 mg every 6 hours as needed, continuing home oxycodone  5 mg every 6 hours as needed, continuing home Cymbalta  30 mg daily, per dispense reports she has not filled gabapentin  in many months so this is not restarted today

## 2024-08-29 NOTE — ED Triage Notes (Signed)
 In addition to initial triage note, pt reports increased swelling in both legs despite taking her meds. Hx of chf

## 2024-08-29 NOTE — Plan of Care (Signed)
 FMTS Interim Progress Note  S: Patient seen on overnight rounds lying in bed. She states she is doing well right now. She states her breathing is improving. Denies any chest pain. She has shoulder and hip pain which are chronic for her. She endorses urinating a lot since receiving lasix .   O: BP 120/89   Pulse 70   Temp 98.4 F (36.9 C)   Resp 18   SpO2 100%   General: NAD Cardiovascular: RRR, no M/R/G Respiratory: CTAB, normal work of breathing on room air Abdominal: soft, non-distended, non-tender to palpation, bowel sounds present  Extremities: R anterior hip tender to palpation, 2+ pitting edema bilaterally to knees, trace edema in thighs  A/P: CHF exacerbation Breathing comfortably on room air. Patient with urine output s/p 40 mg IV lasix . Unable to quantify as purewick was not functioning properly. 1st troponin 18, second pending.  - Strict I/Os - continue lopressor  and spironolactone  - AM CMP, mag  AKI Bladder scan with 200 mL. Patient urinating, quantity unknown as purewick not functioning properly.  - UA ordered  RUQ pain Not present on palpation during this exam.   Rest of plan per day team.     Lennie Raguel MATSU, DO 08/29/2024, 8:10 PM PGY-1, [facility] Family Medicine Service pager [phone]

## 2024-08-29 NOTE — ED Provider Triage Note (Signed)
 Emergency Medicine Provider Triage Evaluation Note  Donna Herrera , a 78 y.o. female  was evaluated in triage.  Pt complains of shortness of breath, orthopnea, decreased urine output Review of Systems  Positive: Shortness of breath, decreased urine output, bilateral lower extremity edema Negative: Chest pain, dizziness, syncope  Physical Exam  BP (!) 130/92 (BP Location: Left Arm)   Pulse 69   Temp 98.2 F (36.8 C) (Oral)   Resp 17   SpO2 98%  Gen:   Awake, no distress   Resp:  Normal effort  MSK:   Bilateral pitting edema up to the calves.  Patient is able to move extremities without difficulty Other:  Patient reports orthopnea and decreased urine output  Medical Decision Making  Medically screening exam initiated at 1:15 PM.  Appropriate orders placed.  Donna Herrera was informed that the remainder of the evaluation will be completed by another provider, this initial triage assessment does not replace that evaluation, and the importance of remaining in the ED until their evaluation is complete.  Patient recently increased Lasix  per Dr. Recommendation and has only been able to urinate approximately 1-2 times per day.  Patient also has orthopnea and increased swelling in bilateral legs.  Patient does have history of CHF and pacemaker due to A-fib.  Patient reports she is not on any blood thinners currently.  Patient does not have any reported pain with urination or foul odors to urine.  Patient denies any fever.   [name], [place] 08/29/24 1319

## 2024-08-29 NOTE — ED Notes (Signed)
Pharmacy tech in room  

## 2024-08-29 NOTE — ED Notes (Signed)
 MD in room

## 2024-08-29 NOTE — Assessment & Plan Note (Addendum)
 She has been following with [facility] and taking her heart failure/diuresis medications as prescribed, but reports she has not been urinating as much for the last several days.  Last EF in February 20 to 25%.  She has had difficulty tolerating previous GDMT given hypotension. -Admit to FM TS, Pray, telemetry - Status post 40 mg IV Lasix  - Complete echo ordered - Strict I's and O's - Daily weights -Continuous pulse ox - SpO2 goal 88 to 92%, given history of COPD - Continue Lopressor  50 mg twice daily - Continue spironolactone  12.5 mg twice daily -AM CMP, magnesium  - troponin ordered for chest tightness, EKG without signs of ST elevations

## 2024-08-29 NOTE — ED Provider Notes (Signed)
 [place] EMERGENCY DEPARTMENT AT [facility] Provider Note   CSN: [id number] Arrival date & time: 08/29/24  1158     Patient presents with: Shortness of Breath   Donna Herrera is a 78 y.o. female.   This is a 78 year old female presenting emergency department with shortness of breath.  She notes for the past 4 days she has had worsening shortness of breath, dyspnea on exertion, orthopnea and worsening lower extremity edema.  Has a history of CHF, and notes that her diuretics were recently increased.  Does report decreased urination.   Shortness of Breath      Prior to Admission medications   Medication Sig Start Date End Date Taking? Authorizing Provider  acetaminophen  (TYLENOL ) 500 MG tablet Take 1,000 mg by mouth daily as needed for mild pain or moderate pain.    [provider]  albuterol  (VENTOLIN  HFA) 108 (90 Base) MCG/ACT inhaler Inhale 1-2 puffs into the lungs every 6 (six) hours as needed for wheezing or shortness of breath. 05/16/23   Baloch, Mahnoor, MD  allopurinol  (ZYLOPRIM ) 100 MG tablet Take 1 tablet (100 mg total) by mouth daily. 07/30/24   Baloch, Mahnoor, MD  colchicine  0.6 MG tablet Take 1 tablet (0.6 mg total) by mouth daily. As needed for acute gout attacks 05/02/24   Baloch, Mahnoor, MD  DULoxetine  (CYMBALTA ) 30 MG capsule TAKE 1 CAPSULE(30 MG) BY MOUTH DAILY 08/13/24   Baloch, Mahnoor, MD  ELIQUIS  5 MG TABS tablet TAKE 1 TABLET BY MOUTH TWICE A DAY 08/14/24   Baloch, Mahnoor, MD  ezetimibe  (ZETIA ) 10 MG tablet Take 1 tablet (10 mg total) by mouth daily. 01/04/24   Baloch, Mahnoor, MD  famotidine  (PEPCID ) 20 MG tablet TAKE 1 TABLET (20 MG TOTAL) BY MOUTH DAILY AS NEEDED FOR HEARTBURN OR INDIGESTION. 03/07/22   Carlyon, Richerd PARAS, DO  ferrous sulfate  325 (65 FE) MG tablet TAKE 1 TABLET BY MOUTH EVERY OTHER DAY 07/22/24   Hindel, Rea, MD  furosemide  (LASIX ) 20 MG tablet TAKE 2 TABLETS BY MOUTH TWICE A DAY FOR 3 DAYS THEN REDUCE TO 2 TABLETS  DAILY 06/21/24   Lelon Hamilton T, PA-C  gabapentin  (NEURONTIN ) 300 MG capsule Take 1 capsule (300 mg total) by mouth at bedtime as needed. TAKE 1 CAP BY MOUTH EVERYDAY AT BEDTIME. 08/01/23   Baloch, Mahnoor, MD  Menthol , Topical Analgesic, (ICY HOT BACK EX) Apply 1 Application topically 2 (two) times daily as needed (lower leg pain, knee pain).    [provider]  metoprolol  tartrate (LOPRESSOR ) 50 MG tablet Take 1 tablet (50 mg total) by mouth 2 (two) times daily. 02/12/24   Baloch, Mahnoor, MD  Multiple Vitamin (MULTIVITAMIN WITH MINERALS) TABS tablet Take 1 tablet by mouth every morning.    [provider]  naloxone [facility]) nasal spray 4 mg/0.1 mL SMARTSIG:Both Nares 04/03/23   [provider]  oxyCODONE  (OXY IR/ROXICODONE ) 5 MG immediate release tablet Take 5 mg by mouth every 6 (six) hours as needed for moderate pain or severe pain. 04/03/23   [provider]  potassium chloride  SA (KLOR-CON  M) 20 MEQ tablet Take 0.5 tablets (10 mEq total) by mouth daily. 07/11/24   Lelon Hamilton T, PA-C  spironolactone  (ALDACTONE ) 25 MG tablet Take 0.5 tablets (12.5 mg total) by mouth daily. 07/11/24 10/09/24  Lelon Hamilton T, PA-C  Vitamin D , Ergocalciferol , (DRISDOL ) 1.25 MG (50000 UNIT) CAPS capsule Take 1 capsule (50,000 Units total) by mouth once a week. Take on Wednesday 05/02/24   Baloch,  Mahnoor, MD    Allergies: Georgianne cerise allergy], Morphine and codeine, Atorvastatin , and Rosuvastatin     Review of Systems  Respiratory:  Positive for shortness of breath.     Updated Vital Signs BP (!) 130/92 (BP Location: Left Arm)   Pulse 69   Temp 98.2 F (36.8 C) (Oral)   Resp 17   SpO2 98%   Physical Exam Vitals and nursing note reviewed.  Constitutional:      General: She is not in acute distress.    Appearance: She is not toxic-appearing.  HENT:     Head: Normocephalic.  Cardiovascular:     Rate and Rhythm: Normal rate and regular rhythm.  Pulmonary:     Effort:  Pulmonary effort is normal.     Breath sounds: No wheezing, rhonchi or rales.  Musculoskeletal:     Right lower leg: Edema present.     Left lower leg: Edema present.  Skin:    General: Skin is warm.     Capillary Refill: Capillary refill takes less than 2 seconds.  Neurological:     Mental Status: She is alert and oriented to person, place, and time.  Psychiatric:        Mood and Affect: Mood normal.        Behavior: Behavior normal.     (all labs ordered are listed, but only abnormal results are displayed) Labs Reviewed  BASIC METABOLIC PANEL WITH GFR - Abnormal; Notable for the following components:      Result Value   Sodium 125 (*)    Chloride 95 (*)    CO2 19 (*)    Glucose, Bld 164 (*)    Creatinine, Ser 1.11 (*)    Calcium  8.8 (*)    GFR, Estimated 51 (*)    All other components within normal limits  BRAIN NATRIURETIC PEPTIDE - Abnormal; Notable for the following components:   B Natriuretic Peptide 2,583.7 (*)    All other components within normal limits  CBC  I-STAT CHEM 8, ED    EKG: EKG Interpretation Date/Time:  Thursday August 29 2024 12:46:38 EDT Ventricular Rate:  70 PR Interval:    QRS Duration:  106 QT Interval:  422 QTC Calculation: 455 R Axis:   -27  Text Interpretation: Ventricular-paced rhythm Abnormal ECG When compared with ECG of 29-May-2024 11:50, PREVIOUS ECG IS PRESENT Confirmed by Neysa Clap [phone]) on 08/29/2024 2:50:10 PM  Radiology: DG Chest 2 View Result Date: 08/29/2024 EXAM: 2 VIEW(S) XRAY OF THE CHEST 08/29/2024 01:01:00 PM COMPARISON: 02/20/2024 CLINICAL HISTORY: sob sob FINDINGS: LINES, TUBES AND DEVICES: Stable left-sided pacemaker. LUNGS AND PLEURA: No focal pulmonary opacity. No pulmonary edema. No pleural effusion. No pneumothorax. HEART AND MEDIASTINUM: Stable cardiomegaly. BONES AND SOFT TISSUES: Severe degenerative changes seen involving both shoulders. IMPRESSION: 1. Stable cardiomegaly with a left-sided pacemaker in  place. 2. Severe bilateral shoulder degenerative changes. Electronically signed by: Lynwood Seip MD 08/29/2024 01:23 PM EDT RP Workstation: [id number]     Procedures   Medications Ordered in the ED - No data to display  Clinical Course as of 08/29/24 1507  Thu Aug 29, 2024  1457 DG Chest 2 View MPRESSION: 1. Stable cardiomegaly with a left-sided pacemaker in place. 2. Severe bilateral shoulder degenerative changes.  Electronically signed by: Lynwood Seip MD 08/29/2024 01:23 PM EDT RP Workstation: [id number]   [TY]  1457 B Natriuretic Peptide(!): 2,583.7 [TY]  1457 Sodium(!): 125 hyervolemic [TY]    Clinical Course User Index [TY] Neysa Clap PARAS, DO  Medical Decision Making Is a 78 year old female complex past medical history per my chart review CHF, COPD, obesity, pacemaker presented the emergency department for symptoms concerning for acute decompensated CHF.  She is afebrile nontachycardic, slightly hypertensive 130/92.  She does not appear to be in overt respiratory distress, speaking in clear sentences.  Lungs with some coarse breath sounds, but no obvious rhonchi.  Reports they recently increased her diuretics, but her symptoms have worsened.  Workup today with significantly elevated BNP 2500 which is worse than her baseline.  Her sodium is 125 which I suspect is hypovolemic hyponatremia, mild elevation in creatinine.  CBC with no leukocytosis to suggest systemic infection.  No anemia.  Due to bed shortage, having excessive boarding times in the emergency department.  Offered possible admission to [facility], but patient refused and will wait here for a bed  Amount and/or Complexity of Data Reviewed Independent Historian:     Details: Family notes recent increase in diuretics External Data Reviewed:     Details: Echo last year in FEB: 1. Left ventricular ejection fraction, by estimation, is <20%. Left  ventricular ejection fraction by 3D  volume is 19 %. The left ventricle has  severely decreased function. The left ventricle demonstrates global  hypokinesis. The left ventricular internal   Labs: ordered. Decision-making details documented in ED Course.    Details: See ED course Radiology: ordered and independent interpretation performed. Decision-making details documented in ED Course.    Details: No pneumothorax.  Some vascular congestion on my depend interpretation ECG/medicine tests:     Details: Paced rhythm.  No ischemic changes  Risk Decision regarding hospitalization. Diagnosis or treatment significantly limited by social determinants of health. Risk Details: Poor health literacy      Final diagnoses:  None    ED Discharge Orders     None          Neysa Caron PARAS, DO 08/29/24 1507

## 2024-08-30 ENCOUNTER — Observation Stay (HOSPITAL_COMMUNITY)

## 2024-08-30 DIAGNOSIS — I509 Heart failure, unspecified: Secondary | ICD-10-CM

## 2024-08-30 LAB — ECHOCARDIOGRAM COMPLETE
AR max vel: 2.71 cm2
AV Area VTI: 2.5 cm2
AV Area mean vel: 2.64 cm2
AV Mean grad: 3 mmHg
AV Peak grad: 4.9 mmHg
Ao pk vel: 1.11 m/s
Area-P 1/2: 4.54 cm2
Calc EF: 23.3 %
Est EF: <20
Height: 66 in
S' Lateral: 5 cm
Single Plane A2C EF: 22.7 %
Single Plane A4C EF: 24.1 %
Weight: 2451.52 [oz_av]

## 2024-08-30 LAB — BASIC METABOLIC PANEL WITH GFR
Anion gap: 14 (ref 5–15)
BUN: 22 mg/dL (ref 8–23)
CO2: 24 mmol/L (ref 22–32)
Calcium: 9 mg/dL (ref 8.9–10.3)
Chloride: 91 mmol/L — ABNORMAL LOW (ref 98–111)
Creatinine, Ser: 1.05 mg/dL — ABNORMAL HIGH (ref 0.44–1.00)
GFR, Estimated: 54 mL/min — ABNORMAL LOW (ref >60)
Glucose, Bld: 119 mg/dL — ABNORMAL HIGH (ref 70–99)
Potassium: 4.1 mmol/L (ref 3.5–5.1)
Sodium: 129 mmol/L — ABNORMAL LOW (ref 135–145)

## 2024-08-30 LAB — MAGNESIUM: Magnesium: 1.9 mg/dL (ref 1.7–2.4)

## 2024-08-30 MED ORDER — FUROSEMIDE 40 MG PO TABS
40.0000 mg | ORAL_TABLET | Freq: Two times a day (BID) | ORAL | Status: DC
  Administered 2024-08-30 – 2024-09-01 (×5): 40 mg via ORAL
  Filled 2024-08-30 (×5): qty 1

## 2024-08-30 MED ORDER — PERFLUTREN LIPID MICROSPHERE
1.0000 mL | INTRAVENOUS | Status: AC | PRN
  Administered 2024-08-30: 2 mL via INTRAVENOUS

## 2024-08-30 MED ORDER — MAGNESIUM SULFATE IN D5W 1-5 GM/100ML-% IV SOLN
1.0000 g | Freq: Once | INTRAVENOUS | Status: AC
  Administered 2024-08-30: 1 g via INTRAVENOUS
  Filled 2024-08-30: qty 100

## 2024-08-30 NOTE — Care Management Obs Status (Signed)
 MEDICARE OBSERVATION STATUS NOTIFICATION   Patient Details  Name: Donna Herrera MRN: [id number] Date of Birth: [birth year]   Medicare Observation Status Notification Given:       Vonzell Arrie Sharps 08/30/2024, 12:52 PM

## 2024-08-30 NOTE — Progress Notes (Addendum)
     Daily Progress Note Intern Pager: [phone]  Patient name: Donna Herrera Medical record number: [id number] Date of birth: January 18, 1946 Age: 78 y.o. Gender: female  Primary Care Provider: Lonnie Earnest, MD Consultants: None Code Status: Full  Pt Overview and Major Events to Date:  10/30-admitted  Assessment and Plan:  Donna Herrera is a 78 y.o. female with PMH HFrEF, A-fib with pacemaker, COPD, HLD, GERD who was admitted for CHF exacerbation with mild AKI that is improving. Assessment & Plan CHF exacerbation (HCC) No I's and O's documented, but patient did have urine output that wet her bed as the PureWick was not working.  Initial bladder scan not concerning for retention.  She is subjectively improved from a respiratory perspective.  Appears closer to euvolemia on exam this morning. - Lasix  40 mg p.o. BID today - Awaiting PT recommendations - Follow echo results - Strict I's and O's - Daily weights - Continuous pulse ox - SpO2 goal 88 to 92%, given history of COPD - Will continue current Lopressor  and spironolactone  - Magnesium  1.9, repleted appropriately to goal of greater than 2 - AM BMP, Mg AKI (acute kidney injury) Hyponatremia Slight improvement in creatinine, and good improvement in sodium to around 130. -Diuresis as above -Urinalysis ordered but not collected Chronic health problem A-fib: Has pacemaker, on Eliquis  COPD: As needed albuterol  nebulizers HLD: Continue Zetia  GERD: Pepcid  20 mg daily as needed Pain management: Tylenol  at 1000 mg every 6 hours as needed, continuing home oxycodone  5 mg every 6 hours as needed, continuing home Cymbalta  30 mg daily, per dispense reports she has not filled gabapentin  in many months so this is not restarted today  FEN/GI: heart 1.5L fluid restriction PPx: eliquis  Dispo:Pending PT recommendations  pending clinical improvement .   Subjective:  She reports she is feeling much better.  No SOB or  orthopnea.  Improved swelling.  Objective: Temp:  [98.2 F (36.8 C)-98.6 F (37 C)] 98.4 F (36.9 C) (10/31 0400) Pulse Rate:  [68-76] 69 (10/31 0400) Resp:  [14-18] 16 (10/31 0400) BP: (97-130)/(60-92) 100/64 (10/31 0400) SpO2:  [98 %-100 %] 100 % (10/31 0400) Weight:  [68.7 kg-69.5 kg] 69.5 kg (10/31 0500) Physical Exam: General: Well-appearing woman lying in bed Cardiovascular: RRR, improved JVD, no HJR, 1+ pretibial edema to the proximal shin Respiratory: CTAB, no increased work of breathing Abdomen: Soft, nontender  Laboratory: Most recent CBC Lab Results  Component Value Date   WBC 4.8 08/29/2024   HGB 12.1 08/29/2024   HCT 37.9 08/29/2024   MCV 88.1 08/29/2024   PLT 191 08/29/2024   Most recent BMP    Latest Ref Rng & Units 08/30/2024    2:25 AM  BMP  Glucose 70 - 99 mg/dL 880   BUN 8 - 23 mg/dL 22   Creatinine 9.55 - 1.00 mg/dL 8.94   Sodium 864 - 854 mmol/L 129   Potassium 3.5 - 5.1 mmol/L 4.1   Chloride 98 - 111 mmol/L 91   CO2 22 - 32 mmol/L 24   Calcium  8.9 - 10.3 mg/dL 9.0    Mg 1.9  Imaging/Diagnostic Tests: None  Alena Morrison, Elio, MD 08/30/2024, 7:35 AM  PGY-1, [place] [facility] FPTS Intern pager: [phone], text pages welcome Secure chat group [facility] Teaching Service

## 2024-08-30 NOTE — Discharge Instructions (Addendum)
 Dear Donna Herrera,   Thank you for letting us  participate in your care! You were admitted to the hospital for heart failure exacerbation. You were treated with diuretics to help remove extra fluid from your body. Cardiology **  POST-HOSPITAL & CARE INSTRUCTIONS Please follow-up with cardiology on 09/04/2024 Please follow-up with your PCP, Dr. Lonnie, on 09/06/2024 Please take your medications as described in this packet.   DOCTOR'S APPOINTMENTS & FOLLOW UP Future Appointments  Date Time Provider Department Center  08/30/2024 11:00 AM MC ECHO 7 MC-ECHOLAB [facility] [address]  09/03/2024  1:50 PM HVC-ECHO 2 HVC-ECHO H&V  09/04/2024  1:30 PM Lelon Hamilton T, PA-C [facility] H&V  09/06/2024  2:50 PM Baloch, Mahnoor, MD [facility]  10/02/2024  7:10 AM CVD HVT DEVICE REMOTES [facility] H&V  01/01/2025  7:00 AM CVD HVT DEVICE REMOTES [facility] H&V  04/02/2025  7:00 AM CVD HVT DEVICE REMOTES [facility] H&V  05/01/2025  2:50 PM [facility]-FPCF ANNUAL WELLNESS VISIT [facility]  07/02/2025  7:00 AM CVD HVT DEVICE REMOTES [facility] H&V  10/01/2025  7:00 AM CVD HVT DEVICE REMOTES [facility] H&V  12/31/2025  7:00 AM CVD HVT DEVICE REMOTES [facility] H&V     Thank you for choosing [facility]! Take care and be well!  Family Medicine Teaching Service Inpatient Team [place]  [facility]  [address] [place], [place] [address] [phone]

## 2024-08-30 NOTE — Assessment & Plan Note (Signed)
 A-fib: Has pacemaker, on Eliquis  COPD: As needed albuterol  nebulizers HLD: Continue Zetia  GERD: Pepcid  20 mg daily as needed Pain management: Tylenol  at 1000 mg every 6 hours as needed, continuing home oxycodone  5 mg every 6 hours as needed, continuing home Cymbalta  30 mg daily, per dispense reports she has not filled gabapentin  in many months so this is not restarted today

## 2024-08-30 NOTE — Evaluation (Signed)
 Physical Therapy Evaluation Patient Details Name: Donna Herrera MRN: [id number] DOB: 10/15/1946 Today's Date: 08/30/2024  History of Present Illness  78 y.o. F adm 08/29/24 with SOB, LB edema, CHF exacerbation. PMHx: HFrEF, PPM, gout, NICM, COPD, Afib, emphysema, HTN, HLD, pulmonary fibrosis.  Clinical Impression  Pt pleasant and reports generalized pain and soreness. Pt with hx of repeated sx of hips and knees with very contracted joints and little to no flexion making positioning and transfers challenging. Pt lives at home with family assist for transfers and bed mobility but walks with RW. Pt with decreased activity tolerance and transfers who will benefit from acute therapy to maximize function but pt declined need for HHPT as she reports family can assist and they have a routine at home. Encouraged mobility with nursing as pt unable to tolerate up to chair due to low surface.         If plan is discharge home, recommend the following: A little help with walking and/or transfers;A little help with bathing/dressing/bathroom;Assistance with cooking/housework;Direct supervision/assist for financial management;Assist for transportation;Help with stairs or ramp for entrance   Can travel by private vehicle        Equipment Recommendations None recommended by PT  Recommendations for Other Services       Functional Status Assessment Patient has had a recent decline in their functional status and/or demonstrates limited ability to make significant improvements in function in a reasonable and predictable amount of time     Precautions / Restrictions Precautions Precautions: Fall;Other (comment) Recall of Precautions/Restrictions: Intact Precaution/Restrictions Comments: virtually no functional hip or knee ROM bil, must rise from tall surfaces grossly 36      Mobility  Bed Mobility Overal bed mobility: Needs Assistance Bed Mobility: Supine to Sit, Sit to Supine     Supine  to sit: Min assist, Used rails Sit to supine: Min assist, Used rails   General bed mobility comments: bed height elevated grossly 36 with pt able to pivot to EOB and slide off EOB with use of rail and blocking Rt foot from supine essentially pivots to standing with assist. Return to bed with min assist to clear legs and position in midline. max assist to slide to [facility]    Transfers Overall transfer level: Needs assistance                 General transfer comment: min assist from grossly 36 height (see bed mobility)    Ambulation/Gait Ambulation/Gait assistance: Contact guard assist Gait Distance (Feet): 22 Feet Assistive device: Rolling walker (2 wheels) Gait Pattern/deviations: Step-to pattern, Decreased stride length, Decreased weight shift to left   Gait velocity interpretation: <1.31 ft/sec, indicative of household ambulator   General Gait Details: pt with limited hip and knee flexion with short strides and reliance on RW  Stairs            Wheelchair Mobility     Tilt Bed    Modified Rankin (Stroke Patients Only)       Balance Overall balance assessment: Needs assistance   Sitting balance-Leahy Scale: Fair     Standing balance support: Bilateral upper extremity supported, Reliant on assistive device for balance, During functional activity Standing balance-Leahy Scale: Poor Standing balance comment: RW in standing                             Pertinent Vitals/Pain Pain Assessment Pain Assessment: 0-10 Pain Score: 4  Pain Location: generalized Pain Descriptors /  Indicators: Aching, Sore Pain Intervention(s): Limited activity within patient's tolerance, Monitored during session, Repositioned, Premedicated before session    Home Living Family/patient expects to be discharged to:: Private residence Living Arrangements: Children;Other relatives Available Help at Discharge: Family;Friend(s);Available 24 hours/day Type of Home: House Home  Access: Stairs to enter;Ramped entrance       Home Layout: One level Home Equipment: Grab bars - tub/shower;Grab bars - toilet;Crutches;Rollator (4 wheels);Cane - single point;BSC/3in1;Hospital bed      Prior Function Prior Level of Function : Needs assist       Physical Assist : Mobility (physical) Mobility (physical): Bed mobility;Transfers   Mobility Comments: has to have assist to rise from surfaces, walks with RW ADLs Comments: family assist with ADLs, family does the IADLs     Extremity/Trunk Assessment   Upper Extremity Assessment Upper Extremity Assessment: Generalized weakness    Lower Extremity Assessment Lower Extremity Assessment: LLE deficits/detail;RLE deficits/detail RLE Deficits / Details: pt with non functional hip and knee ROM grossly 10 degrees, can weight bear once upright LLE Deficits / Details: pt with non functional hip and knee ROM grossly 10 degrees, can weight bear once upright    Cervical / Trunk Assessment Cervical / Trunk Assessment: Normal  Communication   Communication Communication: No apparent difficulties    Cognition Arousal: Alert Behavior During Therapy: WFL for tasks assessed/performed   PT - Cognitive impairments: No apparent impairments                                 Cueing Cueing Techniques: Verbal cues     General Comments      Exercises     Assessment/Plan    PT Assessment Patient needs continued PT services  PT Problem List Decreased range of motion;Decreased activity tolerance;Decreased balance;Decreased mobility;Decreased knowledge of use of DME       PT Treatment Interventions DME instruction;Balance training;Gait training;Functional mobility training;Therapeutic activities;Patient/family education    PT Goals (Current goals can be found in the Care Plan section)  Acute Rehab PT Goals Patient Stated Goal: return home PT Goal Formulation: With patient Time For Goal Achievement:  09/13/24 Potential to Achieve Goals: Fair    Frequency Min 1X/week     Co-evaluation               AM-PAC PT 6 Clicks Mobility  Outcome Measure Help needed turning from your back to your side while in a flat bed without using bedrails?: A Little Help needed moving from lying on your back to sitting on the side of a flat bed without using bedrails?: A Little Help needed moving to and from a bed to a chair (including a wheelchair)?: A Little Help needed standing up from a chair using your arms (e.g., wheelchair or bedside chair)?: A Lot Help needed to walk in hospital room?: A Little Help needed climbing 3-5 steps with a railing? : Total 6 Click Score: 15    End of Session Equipment Utilized During Treatment: Gait belt Activity Tolerance: Patient tolerated treatment well Patient left: in bed;with call bell/phone within reach Nurse Communication: Mobility status PT Visit Diagnosis: Other abnormalities of gait and mobility (R26.89);Difficulty in walking, not elsewhere classified (R26.2);Muscle weakness (generalized) (M62.81)    Time: 8888-8848 PT Time Calculation (min) (ACUTE ONLY): 40 min   Charges:   PT Evaluation $PT Eval Moderate Complexity: 1 Mod PT Treatments $Gait Training: 8-22 mins $Therapeutic Activity: 8-22 mins PT General Charges $$  ACUTE PT VISIT: 1 Visit         Lenoard SQUIBB, PT Acute Rehabilitation Services Office: [phone]   Lenoard KATHEE Docker 08/30/2024, 12:51 PM

## 2024-08-30 NOTE — Assessment & Plan Note (Addendum)
 No I's and O's documented, but patient did have urine output that wet her bed as the PureWick was not working.  Initial bladder scan not concerning for retention.  She is subjectively improved from a respiratory perspective.  Appears closer to euvolemia on exam this morning. - Lasix  40 mg p.o. BID today - Awaiting PT recommendations - Follow echo results - Strict I's and O's - Daily weights - Continuous pulse ox - SpO2 goal 88 to 92%, given history of COPD - Will continue current Lopressor  and spironolactone  - Magnesium  1.9, repleted appropriately to goal of greater than 2 - AM BMP, Mg

## 2024-08-30 NOTE — Assessment & Plan Note (Signed)
 Slight improvement in creatinine, and good improvement in sodium to around 130. -Diuresis as above -Urinalysis ordered but not collected

## 2024-08-30 NOTE — TOC CM/SW Note (Addendum)
 Transition of Care [facility]) - Inpatient Brief Assessment   Patient Details  Name: Donna Herrera MRN: [id number] Date of Birth: 12-30-1945  Transition of [facility] CM/SW Contact:    Waddell Barnie Rama, RN Phone Number: 08/30/2024, 4:46 PM   Clinical Narrative: From home with daughter, has PCP and insurance on file, states has no HH services in place at this time , has walker at home.  States family member  ( sister n social worker) will transport them home at [employer] and family is support system, states gets medications from [facility] on [address]  Pta self ambulatory.   There are no ICM  needs identified  at this time.  Please place consult for ICM needs.  Per PT eval, no pt f/u needed.    Transition of Care Asessment: Insurance and Status: Insurance coverage has been reviewed Patient has primary care physician: Yes Home environment has been reviewed: home with daughter Prior level of function:: ambulatory with walker Prior/Current Home Services: Current home services (walker/bsc) Social Drivers of Health Review: SDOH reviewed no interventions necessary Readmission risk has been reviewed: Yes Transition of care needs: no transition of care needs at this time

## 2024-08-30 NOTE — Progress Notes (Signed)
 Heart Failure Navigator Progress Note  Assessed for Heart & Vascular TOC clinic readiness.  Patient does not meet criteria due to she has a scheduled [facility] appointment on 09/04/2024. No HF TOC. .   Navigator will sign off at this time.   Stephane Haddock, BSN, Scientist, Clinical (histocompatibility And Immunogenetics) Only

## 2024-08-30 NOTE — Progress Notes (Signed)
 1705: Notified by diplomatic services operational officer that pt's daughter, Porter, looking for update as to patient status. Gained verbal permission from patient to discuss care with Ivy. Attempted to call Ivy at contact number in chart - No answer, nonidentifying VM, and unable to leave message as voicemail states memory full.

## 2024-08-31 DIAGNOSIS — I509 Heart failure, unspecified: Secondary | ICD-10-CM | POA: Diagnosis not present

## 2024-08-31 LAB — BASIC METABOLIC PANEL WITH GFR
Anion gap: 11 (ref 5–15)
BUN: 23 mg/dL (ref 8–23)
CO2: 26 mmol/L (ref 22–32)
Calcium: 8.8 mg/dL — ABNORMAL LOW (ref 8.9–10.3)
Chloride: 96 mmol/L — ABNORMAL LOW (ref 98–111)
Creatinine, Ser: 1.16 mg/dL — ABNORMAL HIGH (ref 0.44–1.00)
GFR, Estimated: 48 mL/min — ABNORMAL LOW (ref >60)
Glucose, Bld: 111 mg/dL — ABNORMAL HIGH (ref 70–99)
Potassium: 3.8 mmol/L (ref 3.5–5.1)
Sodium: 133 mmol/L — ABNORMAL LOW (ref 135–145)

## 2024-08-31 LAB — MAGNESIUM: Magnesium: 2 mg/dL (ref 1.7–2.4)

## 2024-08-31 NOTE — Assessment & Plan Note (Addendum)
 Continued decline in renal function, with Cr increased from 1.05 to1.16, GFR decreasing from 54 to 48 Na increased from 129 to 133. Per cardio, if improvement seen on next BMP, can discharge tomorrow.  - Per Cardiology consult: See above - Diuresis as above - Urinalysis ordered but not collected

## 2024-08-31 NOTE — Progress Notes (Addendum)
 Daily Progress Note Intern Pager: [phone]  Patient name: Donna Herrera Medical record number: [id number] Date of birth: 12-23-1945 Age: 78 y.o. Gender: female  Primary Care Provider: Lonnie Earnest, MD Consultants: Cardiology Code Status: Full   Pt Overview and Major Events to Date:  10/30-admitted  Assessment and Plan:  Donna Herrera is a 78 yo female with medical history significant for HFrEF, A-fib with pacemaker on Eliquis , COPD, HLD, GERD  presenting with SOB due to CHF with worsening of her AKI Assessment & Plan CHF exacerbation (HCC) Appears closer to euvolemia on exam this morning. AKI continues to worsen, cardiology consulted this morning and recommended continue current diuresis plan and can discharge tomorrow if renal function improves - Lasix  40 mg p.o. BID   - Echo: EF <20%, same as previous, Cardiology consulted, recommend continued diuresis which should improve renal function. If improvement seen, good for discharge tomorrow  - AM BMP and Mg ordered - PT recommendations: No PT follow-up recommended - Strict I's and O's - Daily weights - Continuous pulse ox - SpO2 goal 88 to 92%, given history of COPD - Will continue current Lopressor  and spironolactone  - Magnesium  1.9, repleted appropriately to goal of greater than 2 AKI (acute kidney injury) Hyponatremia Continued decline in renal function, with Cr increased from 1.05 to1.16, GFR decreasing from 54 to 48 Na increased from 129 to 133. Per cardio, if improvement seen on next BMP, can discharge tomorrow.  - Per Cardiology consult: See above - Diuresis as above - Urinalysis ordered but not collected Chronic health problem A-fib: Has pacemaker, on Eliquis  COPD: As needed albuterol  nebulizers HLD: Continue Zetia  GERD: Pepcid  20 mg daily as needed Pain management: Tylenol  at 1000 mg every 6 hours as needed, continuing home oxycodone  5 mg every 6 hours as needed, continuing home Cymbalta   30 mg daily, per dispense reports she has not filled gabapentin  in many months so this is not restarted today    FEN/GI: 1500 ml fluid restriction PPx: Eliquis  Dispo:Home tomorrow. Barriers include worsening renal function, discharge tomorrow if improved on continued diuresis  Subjective:  Patient laying in bed in no acute distress. She states she is expecting to discharge today, when consulted that we need to monitor her renal functions before she can discharge, patient became confused and expressed concern about renal failure. She was verbally redirected and understood that her declining renal function is due to her CHF. Patient told she will be able to discharge tomorrow if her current medication regimen improves her renal function, which she was amendable to. She did not have any follow up questions.   Objective: Temp:  [97.6 F (36.4 C)-98.6 F (37 C)] 98.6 F (37 C) (11/01 0415) Pulse Rate:  [55-114] 69 (11/01 0415) Resp:  [16-20] 18 (11/01 0415) BP: (95-108)/(55-84) 103/69 (11/01 0415) SpO2:  [97 %-100 %] 97 % (11/01 0415) Weight:  [71 kg] 71 kg (11/01 0500)  Physical Exam: General: Appears stated age, lying in bed in no acute distress Physical Exam Cardiovascular:     Rate and Rhythm: Normal rate and regular rhythm.  Pulmonary:     Effort: Pulmonary effort is normal.     Breath sounds: Normal breath sounds and air entry.     Comments: Faint wheezes Abdominal:     Palpations: Abdomen is soft.     Tenderness: There is no abdominal tenderness.  Musculoskeletal:     Right lower leg: 1+ Edema present.     Left lower leg: 1+ Edema present.  Neurological:     Mental Status: She is alert.     Laboratory: Most recent CBC Lab Results  Component Value Date   WBC 4.8 08/29/2024   HGB 12.1 08/29/2024   HCT 37.9 08/29/2024   MCV 88.1 08/29/2024   PLT 191 08/29/2024   Most recent BMP    Latest Ref Rng & Units 08/31/2024    2:35 AM  BMP  Glucose 70 - 99 mg/dL 888    BUN 8 - 23 mg/dL 23   Creatinine 9.55 - 1.00 mg/dL 8.83   Sodium 864 - 854 mmol/L 133   Potassium 3.5 - 5.1 mmol/L 3.8   Chloride 98 - 111 mmol/L 96   CO2 22 - 32 mmol/L 26   Calcium  8.9 - 10.3 mg/dL 8.8     Imaging/Diagnostic Tests: None  Donna Palma, MD 08/31/2024, 7:48 AM  PGY-1, [facility] FPTS Intern pager: [phone], text pages welcome Secure chat group [facility] Teaching Service

## 2024-08-31 NOTE — Evaluation (Signed)
 Occupational Therapy Evaluation Patient Details Name: Donna Herrera MRN: [id number] DOB: 1946-10-07 Today's Date: 08/31/2024   History of Present Illness   78 y.o. F adm 08/29/24 with SOB, LB edema, CHF exacerbation. PMHx: HFrEF, PPM, gout, NICM, COPD, Afib, emphysema, HTN, HLD, pulmonary fibrosis.     Clinical Impressions Pt admitted based on above, and was seen based on problem list below. PTA pt was living with family, who she reports completes all bathing and dressing ADLs for her. Pt reports at home she has family assist with bed mobility and STS; only walks when supervised. At baseline, pt reports she hovers over a toilet, and can complete her own hygiene in standing. Today, pt is at her baseline for ADLs. Completed bed level bathing with mod assist,  pt able to don brief with mod assist. Completed standing grooming task and perineal hygiene at CGA. Pt able to self-direct care well, and reports family is able to provide necessary assistance. Educated pt on importance of being a continuing participant in her own care. Pt requesting a new BSC d/t hers being older. No further acute OT or follow up OT needs. OT is signing off on this pt.    If plan is discharge home, recommend the following:   A lot of help with walking and/or transfers;A lot of help with bathing/dressing/bathroom;Assistance with cooking/housework     Functional Status Assessment   Patient has had a recent decline in their functional status and demonstrates the ability to make significant improvements in function in a reasonable and predictable amount of time.     Equipment Recommendations   BSC/3in1      Precautions/Restrictions   Precautions Precautions: Fall;Other (comment) Recall of Precautions/Restrictions: Intact Precaution/Restrictions Comments: virtually no functional hip or knee ROM bil, must rise from tall surfaces grossly 36 Restrictions Weight Bearing Restrictions Per Provider  Order: No     Mobility Bed Mobility Overal bed mobility: Needs Assistance Bed Mobility: Supine to Sit, Sit to Supine     Supine to sit: Mod assist, HOB elevated, Used rails Sit to supine: Mod assist, HOB elevated, Used rails   General bed mobility comments: Assist to manage BLEs    Transfers Overall transfer level: Needs assistance Equipment used: Rolling walker (2 wheels) Transfers: Sit to/from Stand Sit to Stand: Contact guard assist           General transfer comment: CGA from elevated bed height. Pt walks hands from bed to RW to come to stand      Balance Overall balance assessment: Needs assistance   Sitting balance-Leahy Scale: Fair     Standing balance support: Bilateral upper extremity supported, Reliant on assistive device for balance, During functional activity Standing balance-Leahy Scale: Poor Standing balance comment: RW in standing     ADL either performed or assessed with clinical judgement   ADL Overall ADL's : At baseline       General ADL Comments: Pt completed bathing from bed level with mod assist. Able to complete LB dressing and toilet hygiene with mod assist. Completed standing grooming tasks at CGA.  At baseline family completes bathing and dressing. Pt reports she self-directs care and family completes.     Vision Patient Visual Report: No change from baseline Vision Assessment?: No apparent visual deficits            Pertinent Vitals/Pain Pain Assessment Pain Assessment: Faces Faces Pain Scale: Hurts a little bit Pain Location: generalized Pain Descriptors / Indicators: Aching, Sore Pain Intervention(s): Monitored during session  Extremity/Trunk Assessment Upper Extremity Assessment Upper Extremity Assessment: Generalized weakness   Lower Extremity Assessment Lower Extremity Assessment: Defer to PT evaluation       Communication Communication Communication: No apparent difficulties   Cognition Arousal:  Alert Behavior During Therapy: WFL for tasks assessed/performed Cognition: No apparent impairments     OT - Cognition Comments: Mild memory deficits, but largely [facility]     Following commands: Intact       Cueing  General Comments   Cueing Techniques: Verbal cues  VSS on RA           Home Living Family/patient expects to be discharged to:: Private residence Living Arrangements: Children;Other relatives Available Help at Discharge: Family;Friend(s);Available 24 hours/day Type of Home: House Home Access: Stairs to enter;Ramped entrance Entrance Stairs-Number of Steps: 4 Entrance Stairs-Rails: Right Home Layout: One level     Bathroom Shower/Tub: Producer, Television/film/video: Handicapped height     Home Equipment: Grab bars - tub/shower;Grab bars - toilet;Crutches;Rollator (4 wheels);Cane - single point;BSC/3in1;Hospital bed          Prior Functioning/Environment Prior Level of Function : Needs assist             Mobility Comments: Family assist with rising from surfaces. Once in standing uses RW with supervision. Only lays in bed or sits in tall chairs ADLs Comments: Family completes all bathing and dressing. Pt reports hovering over toilet to use bathroom, does not sit d/t limited hip flex    OT Problem List: Decreased strength;Decreased range of motion;Decreased activity tolerance;Impaired balance (sitting and/or standing);Decreased safety awareness;Decreased knowledge of use of DME or AE;Cardiopulmonary status limiting activity        OT Goals(Current goals can be found in the care plan section)   Acute Rehab OT Goals Patient Stated Goal: To go home OT Goal Formulation: All assessment and education complete, DC therapy Time For Goal Achievement: 09/14/24 Potential to Achieve Goals: Fair   AM-PAC OT 6 Clicks Daily Activity     Outcome Measure Help from another person eating meals?: None Help from another person taking care of personal  grooming?: A Little Help from another person toileting, which includes using toliet, bedpan, or urinal?: A Little Help from another person bathing (including washing, rinsing, drying)?: A Lot Help from another person to put on and taking off regular upper body clothing?: A Little Help from another person to put on and taking off regular lower body clothing?: A Lot 6 Click Score: 17   End of Session Equipment Utilized During Treatment: Rolling walker (2 wheels) Nurse Communication: Mobility status  Activity Tolerance: Patient tolerated treatment well Patient left: in bed;with call bell/phone within reach  OT Visit Diagnosis: Unsteadiness on feet (R26.81);Other abnormalities of gait and mobility (R26.89);Muscle weakness (generalized) (M62.81)                Time: 8980-8942 OT Time Calculation (min): 38 min Charges:  OT General Charges $OT Visit: 1 Visit OT Evaluation $OT Eval Moderate Complexity: 1 Mod OT Treatments $Self Care/Home Management : 8-22 mins  Adrianne BROCKS, OT  Acute Rehabilitation Services Office [phone] Secure chat preferred   Adrianne GORMAN Savers 08/31/2024, 11:25 AM

## 2024-08-31 NOTE — Care Management (Signed)
 BSC ordered through [employer] to be delivered to the room per patient request

## 2024-08-31 NOTE — Assessment & Plan Note (Addendum)
 Appears closer to euvolemia on exam this morning. AKI continues to worsen, cardiology consulted this morning and recommended continue current diuresis plan and can discharge tomorrow if renal function improves - Lasix  40 mg p.o. BID   - Echo: EF <20%, same as previous, Cardiology consulted, recommend continued diuresis which should improve renal function. If improvement seen, good for discharge tomorrow  - AM BMP and Mg ordered - PT recommendations: No PT follow-up recommended - Strict I's and O's - Daily weights - Continuous pulse ox - SpO2 goal 88 to 92%, given history of COPD - Will continue current Lopressor  and spironolactone  - Magnesium  1.9, repleted appropriately to goal of greater than 2

## 2024-08-31 NOTE — Plan of Care (Signed)

## 2024-08-31 NOTE — Assessment & Plan Note (Signed)
 A-fib: Has pacemaker, on Eliquis  COPD: As needed albuterol  nebulizers HLD: Continue Zetia  GERD: Pepcid  20 mg daily as needed Pain management: Tylenol  at 1000 mg every 6 hours as needed, continuing home oxycodone  5 mg every 6 hours as needed, continuing home Cymbalta  30 mg daily, per dispense reports she has not filled gabapentin  in many months so this is not restarted today

## 2024-09-01 ENCOUNTER — Other Ambulatory Visit (HOSPITAL_COMMUNITY): Payer: Self-pay

## 2024-09-01 DIAGNOSIS — I509 Heart failure, unspecified: Secondary | ICD-10-CM | POA: Diagnosis not present

## 2024-09-01 LAB — BASIC METABOLIC PANEL WITH GFR
Anion gap: 10 (ref 5–15)
BUN: 24 mg/dL — ABNORMAL HIGH (ref 8–23)
CO2: 29 mmol/L (ref 22–32)
Calcium: 8.6 mg/dL — ABNORMAL LOW (ref 8.9–10.3)
Chloride: 92 mmol/L — ABNORMAL LOW (ref 98–111)
Creatinine, Ser: 1.01 mg/dL — ABNORMAL HIGH (ref 0.44–1.00)
GFR, Estimated: 57 mL/min — ABNORMAL LOW (ref >60)
Glucose, Bld: 109 mg/dL — ABNORMAL HIGH (ref 70–99)
Potassium: 3.7 mmol/L (ref 3.5–5.1)
Sodium: 131 mmol/L — ABNORMAL LOW (ref 135–145)

## 2024-09-01 LAB — MAGNESIUM: Magnesium: 1.9 mg/dL (ref 1.7–2.4)

## 2024-09-01 MED ORDER — VITAMIN D (ERGOCALCIFEROL) 1.25 MG (50000 UNIT) PO CAPS
50000.0000 [IU] | ORAL_CAPSULE | ORAL | 0 refills | Status: AC
  Filled 2024-09-01: qty 5, 35d supply, fill #0

## 2024-09-01 MED ORDER — FUROSEMIDE 40 MG PO TABS
40.0000 mg | ORAL_TABLET | Freq: Two times a day (BID) | ORAL | 0 refills | Status: DC
  Filled 2024-09-01: qty 60, 30d supply, fill #0

## 2024-09-01 MED ORDER — METOPROLOL TARTRATE 50 MG PO TABS
50.0000 mg | ORAL_TABLET | Freq: Two times a day (BID) | ORAL | 0 refills | Status: DC
  Filled 2024-09-01: qty 30, 15d supply, fill #0

## 2024-09-01 MED ORDER — POTASSIUM CHLORIDE CRYS ER 20 MEQ PO TBCR
40.0000 meq | EXTENDED_RELEASE_TABLET | Freq: Once | ORAL | Status: AC
  Administered 2024-09-01: 40 meq via ORAL
  Filled 2024-09-01: qty 2

## 2024-09-01 NOTE — Plan of Care (Signed)

## 2024-09-01 NOTE — Progress Notes (Signed)
 Pharmacist Heart Failure Core Measure Documentation  Assessment: Donna Herrera has an EF documented as < 20% on 08/30/24 by Echo.  Rationale: Heart failure patients with left ventricular systolic dysfunction (LVSD) and an EF < 40% should be prescribed an angiotensin converting enzyme inhibitor (ACEI) or angiotensin receptor blocker (ARB) at discharge unless a contraindication is documented in the medical record.  This patient is not currently on an ACEI or ARB for HF.  This note is being placed in the record in order to provide documentation that a contraindication to the use of these agents is present for this encounter.  ACE Inhibitor or Angiotensin Receptor Blocker is contraindicated (specify all that apply)  []   ACEI allergy AND ARB allergy []   Angioedema []   Moderate or severe aortic stenosis []   Hyperkalemia [x]   Hypotension []   Renal artery stenosis []   Worsening renal function, preexisting renal disease or dysfunction  Donna Herrera, PharmD Clinical Pharmacist **Pharmacist phone directory can now be found on [email or web address] ([email or web address]).  Listed under [facility].

## 2024-09-01 NOTE — Discharge Summary (Addendum)
 FMTS Attending Daily Note: Rollene Keeling, MD  Team Pager [phone] Pager [phone]  I have reviewed the below documentation, discussed the patient with resident, and reviewed the assessment and plan as documented in the residents note. I have personally seen the patient on the day of discharge and agree with the below exam.  Time spent planning discharge: 27 minutes    Family Medicine Teaching [facility] Discharge Summary  Patient name: Donna Herrera Medical record number: [id number] Date of birth: 23-Feb-1946 Age: 78 y.o. Gender: female Date of Admission: 08/29/2024  Date of Discharge: 09/01/2024 Admitting Physician: Elio Alena Morrison, MD  Primary Care Provider: Lonnie Earnest, MD Consultants: cardiology  Indication for Hospitalization: CHF exacerbation and AKI  Discharge Diagnoses/Problem List:  Principal Problem for Admission: CHF exacerbation Other Problems addressed during stay:  Principal Problem:   CHF exacerbation (HCC) Active Problems:   AKI (acute kidney injury)   Chronic health problem   Hyponatremia   Right upper quadrant pain   Brief Hospital Course:  Cheralyn Oliver is a 78 y.o.female with a history of HFrEF, Afib with CRT-P on eliquis , COPD, Pulmonary fibrosis, CAD who was admitted to the [facility] Medicine Teaching Service at [facility] for shortness of breath. Her hospital course is detailed below:  CHF exacerbation Presented with dyspnea and volume overload. She was stable on room air. BNP was 2500 in the ED. Updated echo revealed EF <20%. Patient responded well to diuresis and was clinically stable discharged home on Lasix  40 mg BID. Weight at discharge 144.8 lbs.  AKI Presented with elevated Creatinine and low GFR that continued to worsen as admission progressed. Cardiology was consulted and recommenced discharge on Lasix  40 mg BID with cardiology follow-up, stating that renal function should continue to improve on current diuretic  regimen. Cr at discharge 1.01.    Other chronic conditions were medically managed with home medications and formulary alternatives as necessary (A-fib, chronic pain)  PCP Follow-up Recommendations: Ensure cardiology follow-up Please med rec as there are some discrepancies between inpatient med rec per patient report and cardiology dosage recommendations (specifically lasix  and metoprolol )  ?     Results/Tests Pending at Time of Discharge:  Unresulted Labs (From admission, onward)     Start     Ordered   08/29/24 1635  Urinalysis, Complete w Microscopic -Urine, Clean Catch  Once,   R       Question:  Specimen Source  Answer:  Urine, Clean Catch   08/29/24 1640             Disposition: home  Discharge Condition: stable  Discharge Exam:  Vitals:   09/01/24 0725 09/01/24 1120  BP: 110/74 99/66  Pulse: 65 87  Resp: 17 (!) 22  Temp: 98.2 F (36.8 C) 98 F (36.7 C)  SpO2: 99% 98%   Physical exam Gen: Resting comfortably in bed, awake and conversant in no acute distress CV: RRR, normal S1/S2, no M/R/G Respiratory: CTAB, normal work of breathing on room air Abdomen: Normoactive bowel sounds, soft, nontender, nondistended Neuro: No focal deficits Psych: Appropriate mood and affect  Significant Procedures: none  Significant Labs and Imaging:  No results for input(s): WBC, HGB, HCT, PLT in the last 48 hours. Recent Labs  Lab 08/31/24 0235 09/01/24 0147  NA 133* 131*  K 3.8 3.7  CL 96* 92*  CO2 26 29  GLUCOSE 111* 109*  BUN 23 24*  CREATININE 1.16* 1.01*  CALCIUM  8.8* 8.6*  MG 2.0 1.9    08/29/24 CXR:  1.  Stable cardiomegaly with a left-sided pacemaker in place. 2. Severe bilateral shoulder degenerative changes.  08/30/24 Echo:   1. Left ventricular ejection fraction, by estimation, is <20%. Left  ventricular ejection fraction by PLAX is 16 %. The left ventricle has  severely decreased function. The left ventricle demonstrates global   hypokinesis. The left ventricular internal  cavity size was severely dilated. Diastology is indeterminate due to  atrial fibrillation. There is the interventricular septum is flattened in  diastole ('D' shaped left ventricle), consistent with right ventricular  volume overload and the  interventricular septum is flattened in systole, consistent with right  ventricular pressure overload.   2. Right ventricular systolic function is severely reduced. The right  ventricular size is mildly enlarged. There is severely elevated pulmonary  artery systolic pressure.   3. Left atrial size was severely dilated.   4. Right atrial size was moderately dilated.   5. The mitral valve is normal in structure. Mild mitral valve  regurgitation. No evidence of mitral stenosis.   6. The TV leaflets do not coapt. Tricuspid valve regurgitation is severe.  It is likely a consequence of leaflet impingement from the RV pacer lead.   7. The aortic valve is tricuspid. Aortic valve regurgitation is trivial.  Aortic valve sclerosis is present, with no evidence of aortic valve  stenosis.   8. The inferior vena cava is dilated in size with <50% respiratory  variability, suggesting right atrial pressure of 15 mmHg.    Discharge Medications:  Allergies as of 09/01/2024       Reactions   Crab [shellfish Allergy]    Itching to lips   Morphine And Codeine Itching, Swelling   Atorvastatin  Other (See Comments)   achiness   Rosuvastatin  Other (See Comments)   Muscle pain and achiness        Medication List     PAUSE taking these medications    losartan  25 MG tablet Wait to take this until your doctor or other care provider tells you to start again. Commonly known as: COZAAR  Take 25 mg by mouth daily.       TAKE these medications    acetaminophen  500 MG tablet Commonly known as: TYLENOL  Take 1,000 mg by mouth daily as needed for mild pain or moderate pain.   albuterol  108 (90 Base) MCG/ACT  inhaler Commonly known as: VENTOLIN  HFA Inhale 1-2 puffs into the lungs every 6 (six) hours as needed for wheezing or shortness of breath. What changed: how much to take   allopurinol  100 MG tablet Commonly known as: ZYLOPRIM  Take 1 tablet (100 mg total) by mouth daily.   colchicine  0.6 MG tablet Take 1 tablet (0.6 mg total) by mouth daily. As needed for acute gout attacks   Eliquis  5 MG Tabs tablet Generic drug: apixaban  TAKE 1 TABLET BY MOUTH TWICE A DAY   ezetimibe  10 MG tablet Commonly known as: ZETIA  Take 1 tablet (10 mg total) by mouth daily.   famotidine  20 MG tablet Commonly known as: PEPCID  TAKE 1 TABLET (20 MG TOTAL) BY MOUTH DAILY AS NEEDED FOR HEARTBURN OR INDIGESTION.   ferrous sulfate  325 (65 FE) MG tablet TAKE 1 TABLET BY MOUTH EVERY OTHER DAY What changed:  when to take this additional instructions   furosemide  40 MG tablet Commonly known as: LASIX  Take 1 tablet (40 mg total) by mouth 2 (two) times daily. What changed:  medication strength how much to take how to take this when to take this additional instructions  gabapentin  300 MG capsule Commonly known as: NEURONTIN  Take 300 mg by mouth at bedtime as needed (sleep, pain).   ICY HOT BACK EX Apply 1 Application topically 2 (two) times daily as needed (lower leg pain, knee pain).   metoprolol  tartrate 50 MG tablet Commonly known as: LOPRESSOR  Take 1 tablet (50 mg total) by mouth 2 (two) times daily. What changed: Another medication with the same name was removed. Continue taking this medication, and follow the directions you see here.   multivitamin with minerals Tabs tablet Take 1 tablet by mouth every morning.   naloxone 4 MG/0.1ML Liqd nasal spray kit Commonly known as: NARCAN SMARTSIG:Both Nares   oxyCODONE  5 MG immediate release tablet Commonly known as: Oxy IR/ROXICODONE  Take 5 mg by mouth every 6 (six) hours as needed for moderate pain or severe pain.   potassium chloride  10 MEQ  tablet Commonly known as: KLOR-CON  Take 10 mEq by mouth daily.   spironolactone  25 MG tablet Commonly known as: ALDACTONE  Take 0.5 tablets (12.5 mg total) by mouth daily.   Vitamin D  (Ergocalciferol ) 1.25 MG (50000 UNIT) Caps capsule Commonly known as: DRISDOL  Take 1 capsule (50,000 Units total) by mouth once a week. Take on Wednesday               Durable Medical Equipment  (From admission, onward)           Start     Ordered   08/31/24 1125  For home use only DME Bedside commode  Once       Question:  Patient needs a bedside commode to treat with the following condition  Answer:  Weakness   08/31/24 1124            Discharge Instructions: Please refer to Patient Instructions section of EMR for full details.  Patient was counseled important signs and symptoms that should prompt return to medical care, changes in medications, dietary instructions, activity restrictions, and follow up appointments.   Follow-Up Appointments:  Follow-up Information     Baloch, Mahnoor, MD Follow up on 09/06/2024.   Specialty: Family Medicine Why: 1:50 for hospital follow up Contact information: [address] [place] [address] [phone]                 Donzetta Rollene BRAVO, MD 09/01/2024, 1:59 PM PGY-3, [facility]

## 2024-09-02 ENCOUNTER — Encounter: Payer: Self-pay | Admitting: *Deleted

## 2024-09-02 ENCOUNTER — Telehealth: Payer: Self-pay | Admitting: Physician Assistant

## 2024-09-02 ENCOUNTER — Other Ambulatory Visit (HOSPITAL_COMMUNITY): Payer: Self-pay

## 2024-09-02 NOTE — Telephone Encounter (Signed)
 RN reviewed  CANDIE Ferrier PA-C.  He reviewed message. Per Glendia ,need to cancel Echo 09/03/24 / patient had eco while in the hospital  Patient was discharge 09/01/24.  Keep the appt with S.Weaver on 09/04/24.  Per Glendia, if patient is feeling as she did in th hospital - dizzness, short of breath , chest pain - need to go to Er,if not keep appt 09/04/24     RN called spoke to granddaughter- informed her echo will be cancelled and to keep appt 09/04/24. If patient is symptomatic between now and then go back to the Er.  Granddaughter states she is just weak from being in the hospital, she verbalized understanding

## 2024-09-02 NOTE — Telephone Encounter (Signed)
 Pt had a hospital visit recently and is very weak. Her granddaughter and aid state it is very difficult to get her up and out of the house. Pt has echo scheduled 11/4 and f/u appt 11/5. They would like to know if there is any way she could have her echocardiogram on the same day as her appt with Lelon 11/5 at 1:30, as to eliminate her coming out two days in a row. Please advise.

## 2024-09-03 ENCOUNTER — Ambulatory Visit (HOSPITAL_COMMUNITY)

## 2024-09-04 ENCOUNTER — Encounter: Payer: Self-pay | Admitting: Physician Assistant

## 2024-09-04 ENCOUNTER — Ambulatory Visit: Attending: Physician Assistant | Admitting: Physician Assistant

## 2024-09-04 VITALS — BP 100/70 | HR 62 | Ht 66.0 in

## 2024-09-04 DIAGNOSIS — I5020 Unspecified systolic (congestive) heart failure: Secondary | ICD-10-CM | POA: Diagnosis not present

## 2024-09-04 DIAGNOSIS — I4821 Permanent atrial fibrillation: Secondary | ICD-10-CM

## 2024-09-04 NOTE — Assessment & Plan Note (Addendum)
 She had difficulty maintaining NSR and controlling HR. She is s/p CRT-P and AV nodal ablation.  - Continue Eliquis  5 mg twice daily. - Continue metoprolol  tartrate 50 mg twice daily.

## 2024-09-04 NOTE — Assessment & Plan Note (Addendum)
 Nonischemic cardiomyopathy.  EF previously improved to normal in 2024.  She had reduced EF by echocardiogram February 2025 when she was diagnosed with atrial fibrillation with rapid ventricular rate.  She had issues with hypotension and GDMT was reduced.  Farxiga  was stopped secondary to frequent UTIs.  Entresto  was stopped several years ago secondary to hypotension.  She had a cough with lisinopril .  She also had joint pain with losartan .  She was on hydralazine  for a time as well as spironolactone .  She was started back on losartan  she was hospitalized in January with atrial fibrillation.  This was stopped it was stopped due to hypotension.  She had CRT-P in 01/2024. She was recently admitted with acute CHF. Echocardiogram demonstrates severe biventricular failure.  Volume status, overall, appears to be improved since her hospitalization.  She is now back on losartan  in addition to metoprolol  and spironolactone .  I am concerned her weakness is related to her severe heart failure.  With limited GDMT, there are limited options.  I do not think she will be able to tolerate Entresto .  I reviewed her case with Dr. Okey.  Will refer her to the advanced heart failure clinic for further evaluation and management.   - Continue losartan  25 mg daily. - Continue metoprolol  tartrate 50 mg twice daily. - Continue spironolactone  12.5 mg daily. - Continue furosemide  40 mg twice daily. - BMET later this week  - Referred to Advanced Heart Failure Clinic for further management. - Monitor weight daily and report if weight increases by more than 3 pounds in a day.

## 2024-09-04 NOTE — Progress Notes (Signed)
 OFFICE NOTE:    Date:  09/04/2024  ID:  Donna Herrera, DOB 10/24/1946, MRN [id number] PCP: Lonnie Earnest, MD  [facility] Providers Cardiologist:  Vina Gull, MD Electrophysiologist:  OLE ONEIDA HOLTS, MD        (HFrEF) heart failure with reduced ejection fraction  TTE 10/2015: EF 35-40  TTE 06/06/18:  Mod LVH, EF 40-45, diff HK, Gr 1 DD, MAC, trivial MR, mild LAE, trivial TR  TTE 02/14/23: EF 50-55, no RWMA, Gr 1 DD, NL RVSF TTE 12/02/2023: EF 20-25, global HK, severely reduced RVSF, RVSP 40, mild pulmonary hypertension, moderate BAE, trivial MR, moderate TR TEE 12/04/2023: EF <20 severely reduced RVSF, severe RAE, no LAA clot, moderate MR, mild to moderate TR, trivial AI S/p CRT-P in 01/2024 TTE 08/30/24: EF <20, global HK, flattened interventricular septum in diastole (D-shaped) and systole c/w RV volume and pressure overload, severely reduced RVSF, severely elevated PASP, severe LAE, moderate RAE, mild MR, severe TR, trivial AI, AV sclerosis GDMT [x] Beta-blocker:  Metoprolol  tartrate [x] ACE/ARB/ARNI:  Entresto  stopped due to low BP / intol of Losartan  (joint pain), stopped due to low BP / intol of ACE 2/2 cough>>currently taking Losartan  [] SGLT2i:  intol due to UTIs [x] MRA:  Spiro 12.5 [] Hydralazine :  stopped in past due to low BP Non-ischemic cardiomyopathy  Cath 05/11/16: no CAD  Persistent atrial fibrillation  Admitted 11/2023 w RVR - s/p TEE DCCV Dofetilide  Rx >> Failed Rx, stopped  S/p AV nodal ablation (03/2024), CRT-P  Hypertension  Hyperlipidemia  Tobacco use ILD (Interstitial Lung Disease)  Hx of slipped capital femoral epiphysis syndrome  S/p multiple hip surgeries   Aortic atherosclerosis  Hyponatremia        Discussed the use of AI scribe software for clinical note transcription with the patient, who gave verbal consent to proceed. History of Present Illness Donna Herrera is a 78 y.o. female for post hospital follow up.   She was  seen 08/02/24 in the office. Spironolactone  and Lasix  were adjusted. She was admitted 10/30-11/2 with acute CHF. She was diuresed with IV Lasix  and sent home on Lasix  40 mg twice daily. Notes indicated Cardiology was consulted but no notes are available in the chart from Cardiology.  Echocardiogram demonstrated EF less than 20% and severe RV dysfunction.  There was severe TR, severe pulmonary hypertension.  She denies any chest pain. She sleeps in a bed with an adjustable head, which she keeps elevated. She feels weak and experiences joint aches. No dizziness or episodes of passing out. She has difficulty getting up from her bed and feels very weak, attributing this to her recent hospital stay. She tries to stay active by moving around her home as much as possible.    ROS-See HPI    Studies Reviewed:      Labs - Chart Review 08/29/24: BNP 2583.7, ALT 15, hsTrop 18 - 21, Hgb 12.1, PLT 191K 09/01/24: Na 131, K 3.7, SCr 1.01, eGFR 57 Results DIAGNOSTIC Echocardiogram: EF less than 20, severely reduced RVSF, severely elevated PASP, RV pressure and volume overload (08/30/2024)  Risk Assessment/Calculations: CHA2DS2-VASc Score = 5   This indicates a 7.2% annual risk of stroke. The patient's score is based upon: CHF History: 1 HTN History: 1 Diabetes History: 0 Stroke History: 0 Vascular Disease History: 0 Age Score: 2 Gender Score: 1            Physical Exam:  VS:  BP 100/70   Pulse 62   Ht 5' 6 (1.676 m)  SpO2 95%   BMI 23.38 kg/m        Wt Readings from Last 3 Encounters:  09/01/24 144 lb 13.5 oz (65.7 kg)  08/02/24 155 lb (70.3 kg)  08/01/24 155 lb 3.2 oz (70.4 kg)    Constitutional:      Appearance: Healthy appearance. Not in distress.  Neck:     Vascular: No JVR.  Pulmonary:     Breath sounds: Normal breath sounds. No wheezing. No rales.  Cardiovascular:     Normal rate. Regular rhythm.     Murmurs: There is no murmur.  Edema:    Peripheral edema present.     Pretibial: bilateral trace edema of the pretibial area.    Ankle: bilateral 1+ edema of the ankle. Abdominal:     Palpations: Abdomen is soft.       Assessment and Plan:    Assessment & Plan HFrEF (heart failure with reduced ejection fraction) (HCC) Nonischemic cardiomyopathy.  EF previously improved to normal in 2024.  She had reduced EF by echocardiogram February 2025 when she was diagnosed with atrial fibrillation with rapid ventricular rate.  She had issues with hypotension and GDMT was reduced.  Farxiga  was stopped secondary to frequent UTIs.  Entresto  was stopped several years ago secondary to hypotension.  She had a cough with lisinopril .  She also had joint pain with losartan .  She was on hydralazine  for a time as well as spironolactone .  She was started back on losartan  she was hospitalized in January with atrial fibrillation.  This was stopped it was stopped due to hypotension.  She had CRT-P in 01/2024. She was recently admitted with acute CHF. Echocardiogram demonstrates severe biventricular failure.  Volume status, overall, appears to be improved since her hospitalization.  She is now back on losartan  in addition to metoprolol  and spironolactone .  I am concerned her weakness is related to her severe heart failure.  With limited GDMT, there are limited options.  I do not think she will be able to tolerate Entresto .  I reviewed her case with Dr. Okey.  Will refer her to the advanced heart failure clinic for further evaluation and management.   - Continue losartan  25 mg daily. - Continue metoprolol  tartrate 50 mg twice daily. - Continue spironolactone  12.5 mg daily. - Continue furosemide  40 mg twice daily. - BMET later this week  - Referred to Advanced Heart Failure Clinic for further management. - Monitor weight daily and report if weight increases by more than 3 pounds in a day. Permanent atrial fibrillation (HCC) She had difficulty maintaining NSR and controlling HR. She is s/p CRT-P  and AV nodal ablation.  - Continue Eliquis  5 mg twice daily. - Continue metoprolol  tartrate 50 mg twice daily.        Dispo:  Return in about 4 months (around 01/02/2025) for Routine Follow Up, w/ Dr. Okey, or [name], PA-C.  Signed, [name], PA-C

## 2024-09-04 NOTE — Patient Instructions (Signed)
 Medication Instructions:  No changes. Continue current medications.   *If you need a refill on your cardiac medications before your next appointment, please call your pharmacy*  Lab Work: BMET - Get drawn at primary care appt on Friday Order is in the system   If you have labs (blood work) drawn today and your tests are completely normal, you will receive your results only by: MyChart Message (if you have MyChart) OR A paper copy in the mail If you have any lab test that is abnormal or we need to change your treatment, we will call you to review the results.   Follow-Up: At [facility], you and your health needs are our priority.  As part of our continuing mission to provide you with exceptional heart care, our providers are all part of one team.  This team includes your primary Cardiologist (physician) and Advanced Practice Providers or APPs (Physician Assistants and Nurse Practitioners) who all work together to provide you with the care you need, when you need it.  Your next appointment:   4 month(s)  Provider:   Vina Gull, MD or [name], PA-C          We recommend signing up for the patient portal called MyChart.  Sign up information is provided on this After Visit Summary.  MyChart is used to connect with patients for Virtual Visits (Telemedicine).  Patients are able to view lab/test results, encounter notes, upcoming appointments, etc.  Non-urgent messages can be sent to your provider as well.   To learn more about what you can do with MyChart, go to [email or web address].   Other Instructions I have referred you to the Advanced Heart Failure Clinic

## 2024-09-06 ENCOUNTER — Inpatient Hospital Stay: Payer: Self-pay | Admitting: Family Medicine

## 2024-09-06 ENCOUNTER — Ambulatory Visit: Admitting: Family Medicine

## 2024-09-06 ENCOUNTER — Encounter: Payer: Self-pay | Admitting: Family Medicine

## 2024-09-06 VITALS — BP 113/68 | HR 70 | Ht 66.0 in | Wt 146.6 lb

## 2024-09-06 DIAGNOSIS — I10 Essential (primary) hypertension: Secondary | ICD-10-CM | POA: Diagnosis not present

## 2024-09-06 DIAGNOSIS — I4821 Permanent atrial fibrillation: Secondary | ICD-10-CM

## 2024-09-06 DIAGNOSIS — I5020 Unspecified systolic (congestive) heart failure: Secondary | ICD-10-CM | POA: Diagnosis not present

## 2024-09-06 NOTE — Assessment & Plan Note (Addendum)
 Reviewed patient's hospital course and cardiology notes.  Today, patient appears volume overloaded.  Patient to have referral to advanced heart failure clinic for further management. Will have patient take 80mg  lasix  tonight and weight herself. If she is gaining weight, will have her call on call cardiologist provider tomorrow. Discussed need to return to clinic early next week.  At this time we will follow cardiology recommendations medication management as follows: -Continue losartan  25 mg daily -Continue metoprolol  tart Tate 50 mg twice daily  -Continue spironolactone  12.5 mg daily

## 2024-09-06 NOTE — Assessment & Plan Note (Signed)
 Patient is status post CRT-P and AV nodal ablation. -Continue Eliquis  5 mg twice daily -Continue metoprolol  tart Tate 50 mg twice daily

## 2024-09-06 NOTE — Progress Notes (Signed)
    SUBJECTIVE:   CHIEF COMPLAINT / HPI:   Patient is here for hospital follow up.   Patient is a 78 year old female with a past medical history of HFrEF, A-fib with CRT-P on Eliquis , COPD, pulmonary fibrosis, CAD who was admitted to the failure medicine teaching service for shortness of breath.  Patient presented with dyspnea and volume overload and BNP was found to be 2500 in the ED.  Updated echo revealed an EF of less than 20%.  Patient was diuresed with Lasix  and discharged home on 40 mg Lasix  twice daily.  Patient also had elevated creatinine and low GFR consistent with AKI.  Creatinine continued to improve and was stable at 1.01 at discharge.   Patient was seen at cardiology's office couple of days ago for follow-up.  At cardiologist office, discussed regimen for heart failure.  Per cardiology, patient to continue regimen as listed below. Since appointment yesterday, noticed increased swelling in bilateral feet. Denies any shortness of breath of chest pain.    OBJECTIVE:   BP 113/68   Pulse 70   Ht 5' 6 (1.676 m)   Wt 146 lb 9.6 oz (66.5 kg)   SpO2 100%   BMI 23.66 kg/m   General: A&O, NAD, standing at edge of exam table  HEENT: No sign of trauma, EOM grossly intact Cardiac: paced rhythm, no m/r/g Respiratory: CTAB, normal WOB, no w/c/r GI: Soft, NTTP, non-distended  Extremities: nTTP, 2+ pitting edema up to bilateral knees  Neuro: Normal gait but slow with walker  Psych: Appropriate mood and affect   ASSESSMENT/PLAN:   Assessment & Plan HFrEF (heart failure with reduced ejection fraction) [facility]) Reviewed patient's hospital course and cardiology notes.  Today, patient appears volume overloaded.  Patient to have referral to advanced heart failure clinic for further management. Will have patient take 80mg  lasix  tonight and weight herself. If she is gaining weight, will have her call on call cardiologist provider tomorrow. Discussed need to return to clinic early next week.   At this time we will follow cardiology recommendations medication management as follows: -Continue losartan  25 mg daily -Continue metoprolol  tart Tate 50 mg twice daily  -Continue spironolactone  12.5 mg daily  Permanent atrial fibrillation (HCC) Patient is status post CRT-P and AV nodal ablation. -Continue Eliquis  5 mg twice daily -Continue metoprolol  tart Tate 50 mg twice daily Essential hypertension At previous visit, discontinued losartan  due to low blood pressure check.  This was restarted for patient at cardiologist office.  Blood pressure today is at goal. Will continue losartan  25 mg daily, monitor for signs of hypotension.    Gloriann Ogren, MD [facility]

## 2024-09-06 NOTE — Patient Instructions (Addendum)
 It was wonderful to see you today.  Please bring ALL of your medications with you to every visit.   Today we talked about:  Your heart failure: Please increase your lasix  to 80mg  tonight. Weigh yourself tomorrow, if you are gaining weight still, please call the cardiologist on call provider. Please continue taking the regimen as prescribed by your cardiologist.  If you experience worsening shortness of breath, weight gain, leg swelling, please call our office or the cardiologist.  -Continue losartan  25 mg daily -Continue metoprolol  tartate 50 mg twice daily  -Continue spironolactone  12.5 mg daily -Continue furosemide  40 mg twice daily Thank you for choosing [facility].   Please call [phone] with any questions about today's appointment.  Please arrive at least 15 minutes prior to your scheduled appointments.   If you had blood work today, I will send you a MyChart message or a letter if results are normal. Otherwise, I will give you a call.   If you had a referral placed, they will call you to set up an appointment. Please give us  a call if you don't hear back in the next 2 weeks.   If you need additional refills before your next appointment, please call your pharmacy first.   Do you need your medications delivered to your home?   We'll send your prescription to the [facility] for delivery.          Address: [address] [place], [place], [place] [address]          Phone: [phone]  Please call the [facility] to speak with a pharmacist and set up your home medication delivery. If you have any questions, feel free to contact us  -- we're happy to help!  Other [facility] that offer affordable prices on both prescriptions and over-the-counter items, as well as convenient services like vaccinations, are  [facility], at [facility]         Address:  [address], [place], [place] [address]          Phone: [phone]  [facility], located in the [facility]        Address: [address], [place], [place] [address]        Phone: [phone]  [facility], at [facility]       Address: [address], [place], [place] [address]       Phone: [phone]  [facility], at [facility]       Address: [address], [place], [place] [address]       Phone: [phone]  You should follow up in our clinic in No follow-ups on file.  Gloriann Ogren, MD Family Medicine

## 2024-09-06 NOTE — Assessment & Plan Note (Addendum)
 At previous visit, discontinued losartan  due to low blood pressure check.  This was restarted for patient at cardiologist office.  Blood pressure today is at goal. Will continue losartan  25 mg daily, monitor for signs of hypotension.

## 2024-09-07 ENCOUNTER — Telehealth: Payer: Self-pay | Admitting: Family Medicine

## 2024-09-07 LAB — BASIC METABOLIC PANEL WITH GFR
BUN/Creatinine Ratio: 22 (ref 12–28)
BUN: 19 mg/dL (ref 8–27)
CO2: 18 mmol/L — ABNORMAL LOW (ref 20–29)
Calcium: 9.3 mg/dL (ref 8.7–10.3)
Chloride: 93 mmol/L — ABNORMAL LOW (ref 96–106)
Creatinine, Ser: 0.87 mg/dL (ref 0.57–1.00)
Glucose: 74 mg/dL (ref 70–99)
Potassium: 5.4 mmol/L — ABNORMAL HIGH (ref 3.5–5.2)
Sodium: 130 mmol/L — ABNORMAL LOW (ref 134–144)
eGFR: 68 mL/min/1.73 (ref >59)

## 2024-09-07 NOTE — Telephone Encounter (Signed)
 Spoke with patient regarding BMP result which showed K of 5.4, instructed patient to stop her potassium supplementation.  Patient reports she did take Lasix  80 mg last night and has had a lot of output.  Advised to continue taking Lasix  40 mg twice daily.  Patient scheduled with follow-up appointment on Tuesday 11/11 at 2:50 PM as she wanted to see her PCP Dr. Lonnie.  She requested I call her granddaughter Bradly and update her with the same information.  Attempted to call Skyler x 2, left voicemail regarding medication changes and follow-up appointment.  Will forward to PCP to make aware.  Izetta Nap, DO

## 2024-09-10 ENCOUNTER — Encounter: Payer: Self-pay | Admitting: Family Medicine

## 2024-09-10 ENCOUNTER — Ambulatory Visit (INDEPENDENT_AMBULATORY_CARE_PROVIDER_SITE_OTHER): Admitting: Family Medicine

## 2024-09-10 VITALS — BP 103/72 | HR 72 | Wt 146.4 lb

## 2024-09-10 DIAGNOSIS — I5020 Unspecified systolic (congestive) heart failure: Secondary | ICD-10-CM

## 2024-09-10 NOTE — Progress Notes (Signed)
    SUBJECTIVE:   CHIEF COMPLAINT / HPI:   Patient is a 78 year old female with past medical history of HFrEF, A-fib with CRT-P on Eliquis , COPD, pulmonary fibrosis, CAD who was admitted to the family medicine teaching service for shortness of breath and found to be in acute heart failure.  Patient was diuresed and discharged home on 40 mg Lasix  twice daily.  Patient was seen by me on 11/7.  During this visit, patient was volume overloaded with bilateral worsening lower extremity edema.  Patient was instructed to take 80 mg of Lasix  for her nighttime dose and weigh herself.  At last visit patient weighed 146 pounds.  Patient reports taking 80mg  lasix  twice daily instead of 40mg  twice daily. Reports she has been using the bathroom so much, she has wet the bed multiple times. Was called to stop taking potassium which she did. Otherwise has been feeling well, was able to cook yesterday since she had increased energy.    PERTINENT  PMH / PSH: reviewed  OBJECTIVE:   BP 103/72   Pulse 72   Wt 146 lb 6.4 oz (66.4 kg)   SpO2 97%   BMI 23.63 kg/m   General: A&O, NAD HEENT: No sign of trauma, EOM grossly intact Cardiac: Regular paced R&R, no m/r/g Respiratory: CTAB, normal WOB, no w/c/r GI: Soft, NTTP, non-distended  Extremities: NTTP, 2+ bilateral pitting edema, similar to prior exam  Neuro: Normal but slow gait with assistance of walker  Psych: Appropriate mood and affect   ASSESSMENT/PLAN:   Assessment & Plan HFrEF (heart failure with reduced ejection fraction) (HCC) Patient has been taking 80mg  lasix  BID. Will have patient continue with 80mg  in the morning and 40mg  at night as this seems to be beneficial but she is having trouble with using the bathroom at night. K was high at last visit, will have patient stop taking potassium supplementation. Recheck BMP today. If K is still high, consider stopping spironolactone       Gloriann Ogren, MD [facility]

## 2024-09-10 NOTE — Assessment & Plan Note (Signed)
 Patient has been taking 80mg  lasix  BID. Will have patient continue with 80mg  in the morning and 40mg  at night as this seems to be beneficial but she is having trouble with using the bathroom at night. K was high at last visit, will have patient stop taking potassium supplementation. Recheck BMP today. If K is still high, consider stopping spironolactone 

## 2024-09-10 NOTE — Patient Instructions (Addendum)
 It was wonderful to see you today.  Please bring ALL of your medications with you to every visit.   Today we talked about:  Take 2 pills of your lasix  (80mg ) in the morning and 40 mg (one tablet) at night  I am checking your potassium   Thank you for choosing [facility].   Please call [phone] with any questions about today's appointment.  Please arrive at least 15 minutes prior to your scheduled appointments.   If you had blood work today, I will send you a MyChart message or a letter if results are normal. Otherwise, I will give you a call.   If you had a referral placed, they will call you to set up an appointment. Please give us  a call if you don't hear back in the next 2 weeks.   If you need additional refills before your next appointment, please call your pharmacy first.   Do you need your medications delivered to your home?   We'll send your prescription to the [facility] for delivery.          Address: [address] [place], [place], [place] [address]          Phone: [phone]  Please call the [facility] to speak with a pharmacist and set up your home medication delivery. If you have any questions, feel free to contact us  -- we're happy to help!  Other [facility] that offer affordable prices on both prescriptions and over-the-counter items, as well as convenient services like vaccinations, are  [facility], at [facility]         Address:  [address], [place], [place] [address]         Phone: [phone]  [facility], located in the [facility]        Address: [address] [place], [place] [address]        Phone: [phone]  [facility], at [facility]       Address: [address], [place], [place] [address]       Phone: [phone]  [facility], at [facility]        Address: [address], [place], [place] [address]       Phone: [phone]  Gloriann Ogren, MD Family Medicine

## 2024-09-19 ENCOUNTER — Other Ambulatory Visit: Payer: Self-pay | Admitting: Family Medicine

## 2024-09-19 DIAGNOSIS — R200 Anesthesia of skin: Secondary | ICD-10-CM

## 2024-09-19 DIAGNOSIS — M1A09X0 Idiopathic chronic gout, multiple sites, without tophus (tophi): Secondary | ICD-10-CM

## 2024-10-02 ENCOUNTER — Ambulatory Visit

## 2024-10-03 LAB — CUP PACEART REMOTE DEVICE CHECK
Battery Remaining Longevity: 90 mo
Battery Remaining Percentage: 95 %
Battery Voltage: 3.01 V
Date Time Interrogation Session: 20251203020009
Implantable Lead Connection Status: 753985
Implantable Lead Connection Status: 753985
Implantable Lead Connection Status: 753985
Implantable Lead Implant Date: 20250421
Implantable Lead Implant Date: 20250421
Implantable Lead Implant Date: 20250421
Implantable Lead Location: 753858
Implantable Lead Location: 753859
Implantable Lead Location: 753860
Implantable Pulse Generator Implant Date: 20250421
Lead Channel Impedance Value: 440 Ohm
Lead Channel Impedance Value: 450 Ohm
Lead Channel Pacing Threshold Amplitude: 1 V
Lead Channel Pacing Threshold Amplitude: 1 V
Lead Channel Pacing Threshold Pulse Width: 0.5 ms
Lead Channel Pacing Threshold Pulse Width: 0.5 ms
Lead Channel Sensing Intrinsic Amplitude: 5.8 mV
Lead Channel Setting Pacing Amplitude: 2 V
Lead Channel Setting Pacing Amplitude: 2 V
Lead Channel Setting Pacing Pulse Width: 0.5 ms
Lead Channel Setting Pacing Pulse Width: 0.5 ms
Lead Channel Setting Sensing Sensitivity: 2 mV
Pulse Gen Model: 3562
Pulse Gen Serial Number: 8246599

## 2024-10-07 ENCOUNTER — Other Ambulatory Visit: Payer: Self-pay | Admitting: Internal Medicine

## 2024-10-09 NOTE — Progress Notes (Signed)
 Remote PPM Transmission

## 2024-10-10 MED ORDER — FUROSEMIDE 40 MG PO TABS: 40.0000 mg | ORAL_TABLET | Freq: Two times a day (BID) | ORAL | 3 refills | Status: DC

## 2024-10-22 ENCOUNTER — Ambulatory Visit: Admitting: Family Medicine

## 2024-10-22 ENCOUNTER — Other Ambulatory Visit: Payer: Self-pay

## 2024-10-22 ENCOUNTER — Emergency Department (HOSPITAL_COMMUNITY)

## 2024-10-22 ENCOUNTER — Inpatient Hospital Stay (HOSPITAL_COMMUNITY)
Admission: EM | Admit: 2024-10-22 | Discharge: 2024-10-28 | DRG: 291 | Disposition: A | Discharge status: Home Health | Source: Ambulatory Visit | Attending: Family Medicine | Admitting: Family Medicine

## 2024-10-22 VITALS — BP 116/70 | HR 98

## 2024-10-22 DIAGNOSIS — I110 Hypertensive heart disease with heart failure: Secondary | ICD-10-CM | POA: Diagnosis present

## 2024-10-22 DIAGNOSIS — I5043 Acute on chronic combined systolic (congestive) and diastolic (congestive) heart failure: Secondary | ICD-10-CM | POA: Diagnosis present

## 2024-10-22 DIAGNOSIS — I5021 Acute systolic (congestive) heart failure: Secondary | ICD-10-CM

## 2024-10-22 DIAGNOSIS — E785 Hyperlipidemia, unspecified: Secondary | ICD-10-CM | POA: Diagnosis present

## 2024-10-22 DIAGNOSIS — L89152 Pressure ulcer of sacral region, stage 2: Secondary | ICD-10-CM | POA: Diagnosis present

## 2024-10-22 DIAGNOSIS — Z789 Other specified health status: Secondary | ICD-10-CM

## 2024-10-22 DIAGNOSIS — R319 Hematuria, unspecified: Secondary | ICD-10-CM | POA: Diagnosis present

## 2024-10-22 DIAGNOSIS — Z79899 Other long term (current) drug therapy: Secondary | ICD-10-CM | POA: Diagnosis not present

## 2024-10-22 DIAGNOSIS — R200 Anesthesia of skin: Secondary | ICD-10-CM

## 2024-10-22 DIAGNOSIS — G894 Chronic pain syndrome: Secondary | ICD-10-CM | POA: Diagnosis present

## 2024-10-22 DIAGNOSIS — I2510 Atherosclerotic heart disease of native coronary artery without angina pectoris: Secondary | ICD-10-CM | POA: Diagnosis present

## 2024-10-22 DIAGNOSIS — Z823 Family history of stroke: Secondary | ICD-10-CM

## 2024-10-22 DIAGNOSIS — E876 Hypokalemia: Secondary | ICD-10-CM | POA: Diagnosis present

## 2024-10-22 DIAGNOSIS — Z7901 Long term (current) use of anticoagulants: Secondary | ICD-10-CM

## 2024-10-22 DIAGNOSIS — F32A Depression, unspecified: Secondary | ICD-10-CM | POA: Diagnosis present

## 2024-10-22 DIAGNOSIS — M1A09X0 Idiopathic chronic gout, multiple sites, without tophus (tophi): Secondary | ICD-10-CM

## 2024-10-22 DIAGNOSIS — Z8249 Family history of ischemic heart disease and other diseases of the circulatory system: Secondary | ICD-10-CM | POA: Diagnosis not present

## 2024-10-22 DIAGNOSIS — I5023 Acute on chronic systolic (congestive) heart failure: Secondary | ICD-10-CM

## 2024-10-22 DIAGNOSIS — J439 Emphysema, unspecified: Secondary | ICD-10-CM | POA: Diagnosis present

## 2024-10-22 DIAGNOSIS — I700 Atherosclerosis of aorta: Secondary | ICD-10-CM | POA: Diagnosis present

## 2024-10-22 DIAGNOSIS — I4891 Unspecified atrial fibrillation: Secondary | ICD-10-CM

## 2024-10-22 DIAGNOSIS — M109 Gout, unspecified: Secondary | ICD-10-CM | POA: Diagnosis present

## 2024-10-22 DIAGNOSIS — I4819 Other persistent atrial fibrillation: Secondary | ICD-10-CM | POA: Diagnosis not present

## 2024-10-22 DIAGNOSIS — I10 Essential (primary) hypertension: Secondary | ICD-10-CM | POA: Diagnosis not present

## 2024-10-22 DIAGNOSIS — I4821 Permanent atrial fibrillation: Secondary | ICD-10-CM | POA: Diagnosis present

## 2024-10-22 DIAGNOSIS — R9431 Abnormal electrocardiogram [ECG] [EKG]: Secondary | ICD-10-CM

## 2024-10-22 DIAGNOSIS — I428 Other cardiomyopathies: Secondary | ICD-10-CM | POA: Diagnosis present

## 2024-10-22 DIAGNOSIS — Z66 Do not resuscitate: Secondary | ICD-10-CM | POA: Diagnosis present

## 2024-10-22 DIAGNOSIS — I493 Ventricular premature depolarization: Secondary | ICD-10-CM | POA: Diagnosis not present

## 2024-10-22 DIAGNOSIS — M1990 Unspecified osteoarthritis, unspecified site: Secondary | ICD-10-CM | POA: Diagnosis present

## 2024-10-22 DIAGNOSIS — J8410 Pulmonary fibrosis, unspecified: Secondary | ICD-10-CM | POA: Diagnosis present

## 2024-10-22 DIAGNOSIS — Z860100 Personal history of colon polyps, unspecified: Secondary | ICD-10-CM

## 2024-10-22 DIAGNOSIS — I501 Left ventricular failure: Secondary | ICD-10-CM

## 2024-10-22 DIAGNOSIS — Z818 Family history of other mental and behavioral disorders: Secondary | ICD-10-CM

## 2024-10-22 DIAGNOSIS — N390 Urinary tract infection, site not specified: Secondary | ICD-10-CM | POA: Diagnosis present

## 2024-10-22 DIAGNOSIS — K648 Other hemorrhoids: Secondary | ICD-10-CM | POA: Diagnosis present

## 2024-10-22 DIAGNOSIS — R601 Generalized edema: Principal | ICD-10-CM

## 2024-10-22 DIAGNOSIS — K5730 Diverticulosis of large intestine without perforation or abscess without bleeding: Secondary | ICD-10-CM | POA: Diagnosis present

## 2024-10-22 DIAGNOSIS — B9620 Unspecified Escherichia coli [E. coli] as the cause of diseases classified elsewhere: Secondary | ICD-10-CM | POA: Diagnosis present

## 2024-10-22 DIAGNOSIS — Z808 Family history of malignant neoplasm of other organs or systems: Secondary | ICD-10-CM

## 2024-10-22 DIAGNOSIS — R1024 Suprapubic pain: Secondary | ICD-10-CM | POA: Insufficient documentation

## 2024-10-22 DIAGNOSIS — G629 Polyneuropathy, unspecified: Secondary | ICD-10-CM | POA: Diagnosis present

## 2024-10-22 DIAGNOSIS — R7303 Prediabetes: Secondary | ICD-10-CM | POA: Diagnosis present

## 2024-10-22 DIAGNOSIS — I5082 Biventricular heart failure: Secondary | ICD-10-CM | POA: Diagnosis present

## 2024-10-22 DIAGNOSIS — I071 Rheumatic tricuspid insufficiency: Secondary | ICD-10-CM | POA: Diagnosis present

## 2024-10-22 DIAGNOSIS — N12 Tubulo-interstitial nephritis, not specified as acute or chronic: Secondary | ICD-10-CM | POA: Diagnosis present

## 2024-10-22 DIAGNOSIS — R0602 Shortness of breath: Secondary | ICD-10-CM | POA: Diagnosis present

## 2024-10-22 DIAGNOSIS — I509 Heart failure, unspecified: Principal | ICD-10-CM

## 2024-10-22 DIAGNOSIS — Z885 Allergy status to narcotic agent status: Secondary | ICD-10-CM

## 2024-10-22 DIAGNOSIS — Z96641 Presence of right artificial hip joint: Secondary | ICD-10-CM | POA: Diagnosis present

## 2024-10-22 DIAGNOSIS — R300 Dysuria: Secondary | ICD-10-CM

## 2024-10-22 DIAGNOSIS — K219 Gastro-esophageal reflux disease without esophagitis: Secondary | ICD-10-CM | POA: Diagnosis present

## 2024-10-22 DIAGNOSIS — Z87891 Personal history of nicotine dependence: Secondary | ICD-10-CM

## 2024-10-22 DIAGNOSIS — L8990 Pressure ulcer of unspecified site, unspecified stage: Secondary | ICD-10-CM | POA: Insufficient documentation

## 2024-10-22 DIAGNOSIS — Z91013 Allergy to seafood: Secondary | ICD-10-CM

## 2024-10-22 DIAGNOSIS — K625 Hemorrhage of anus and rectum: Secondary | ICD-10-CM

## 2024-10-22 DIAGNOSIS — Z950 Presence of cardiac pacemaker: Secondary | ICD-10-CM

## 2024-10-22 DIAGNOSIS — K921 Melena: Secondary | ICD-10-CM | POA: Diagnosis not present

## 2024-10-22 DIAGNOSIS — Z833 Family history of diabetes mellitus: Secondary | ICD-10-CM | POA: Diagnosis not present

## 2024-10-22 DIAGNOSIS — Z800 Family history of malignant neoplasm of digestive organs: Secondary | ICD-10-CM

## 2024-10-22 DIAGNOSIS — Z87440 Personal history of urinary (tract) infections: Secondary | ICD-10-CM

## 2024-10-22 DIAGNOSIS — I959 Hypotension, unspecified: Secondary | ICD-10-CM | POA: Diagnosis present

## 2024-10-22 DIAGNOSIS — I5020 Unspecified systolic (congestive) heart failure: Secondary | ICD-10-CM | POA: Diagnosis present

## 2024-10-22 LAB — CBC WITH DIFFERENTIAL/PLATELET
Abs Immature Granulocytes: 0.01 K/uL (ref 0.00–0.07)
Basophils Absolute: 0 K/uL (ref 0.0–0.1)
Basophils Relative: 1 %
Eosinophils Absolute: 0.1 K/uL (ref 0.0–0.5)
Eosinophils Relative: 2 %
HCT: 40.7 % (ref 36.0–46.0)
Hemoglobin: 12.9 g/dL (ref 12.0–15.0)
Immature Granulocytes: 0 %
Lymphocytes Relative: 25 %
Lymphs Abs: 1.2 K/uL (ref 0.7–4.0)
MCH: 28.7 pg (ref 26.0–34.0)
MCHC: 31.7 g/dL (ref 30.0–36.0)
MCV: 90.4 fL (ref 80.0–100.0)
Monocytes Absolute: 0.5 K/uL (ref 0.1–1.0)
Monocytes Relative: 10 %
Neutro Abs: 2.9 K/uL (ref 1.7–7.7)
Neutrophils Relative %: 62 %
Platelets: 251 K/uL (ref 150–400)
RBC: 4.5 MIL/uL (ref 3.87–5.11)
RDW: 17.5 % — ABNORMAL HIGH (ref 11.5–15.5)
WBC: 4.7 K/uL (ref 4.0–10.5)
nRBC: 0 % (ref 0.0–0.2)

## 2024-10-22 LAB — COMPREHENSIVE METABOLIC PANEL WITH GFR
ALT: 8 U/L (ref 0–44)
AST: 35 U/L (ref 15–41)
Albumin: 3.7 g/dL (ref 3.5–5.0)
Alkaline Phosphatase: 112 U/L (ref 38–126)
Anion gap: 13 (ref 5–15)
BUN: 15 mg/dL (ref 8–23)
CO2: 26 mmol/L (ref 22–32)
Calcium: 8.8 mg/dL — ABNORMAL LOW (ref 8.9–10.3)
Chloride: 97 mmol/L — ABNORMAL LOW (ref 98–111)
Creatinine, Ser: 0.74 mg/dL (ref 0.44–1.00)
GFR, Estimated: >60 mL/min
Glucose, Bld: 84 mg/dL (ref 70–99)
Potassium: 3.4 mmol/L — ABNORMAL LOW (ref 3.5–5.1)
Sodium: 136 mmol/L (ref 135–145)
Total Bilirubin: 1 mg/dL (ref 0.0–1.2)
Total Protein: 7.3 g/dL (ref 6.5–8.1)

## 2024-10-22 LAB — PRO BRAIN NATRIURETIC PEPTIDE: Pro Brain Natriuretic Peptide: 6767 pg/mL — ABNORMAL HIGH

## 2024-10-22 LAB — RESP PANEL BY RT-PCR (RSV, FLU A&B, COVID)  RVPGX2
Influenza A by PCR: NEGATIVE
Influenza B by PCR: NEGATIVE
Resp Syncytial Virus by PCR: NEGATIVE
SARS Coronavirus 2 by RT PCR: NEGATIVE

## 2024-10-22 LAB — TROPONIN T, HIGH SENSITIVITY
Troponin T High Sensitivity: 18 ng/L (ref 0–19)
Troponin T High Sensitivity: 17 ng/L (ref 0–19)

## 2024-10-22 MED ORDER — EZETIMIBE 10 MG PO TABS
10.0000 mg | ORAL_TABLET | Freq: Every day | ORAL | Status: DC
  Administered 2024-10-23 – 2024-10-28 (×6): 10 mg via ORAL
  Filled 2024-10-22 (×5): qty 1

## 2024-10-22 MED ORDER — FUROSEMIDE 10 MG/ML IJ SOLN
80.0000 mg | Freq: Two times a day (BID) | INTRAMUSCULAR | Status: DC
  Administered 2024-10-23 – 2024-10-25 (×5): 80 mg via INTRAVENOUS
  Filled 2024-10-22 (×5): qty 8

## 2024-10-22 MED ORDER — METOPROLOL TARTRATE 25 MG PO TABS
50.0000 mg | ORAL_TABLET | Freq: Two times a day (BID) | ORAL | Status: DC
  Administered 2024-10-22: 50 mg via ORAL
  Filled 2024-10-22: qty 2

## 2024-10-22 MED ORDER — GABAPENTIN 300 MG PO CAPS
300.0000 mg | ORAL_CAPSULE | Freq: Every day | ORAL | Status: DC
  Administered 2024-10-22 – 2024-10-27 (×6): 300 mg via ORAL
  Filled 2024-10-22 (×6): qty 1

## 2024-10-22 MED ORDER — APIXABAN 5 MG PO TABS
5.0000 mg | ORAL_TABLET | Freq: Two times a day (BID) | ORAL | Status: DC
  Administered 2024-10-23 – 2024-10-28 (×11): 5 mg via ORAL
  Filled 2024-10-22 (×10): qty 1

## 2024-10-22 MED ORDER — OXYCODONE HCL 5 MG PO TABS
5.0000 mg | ORAL_TABLET | Freq: Four times a day (QID) | ORAL | Status: DC | PRN
  Administered 2024-10-22 – 2024-10-28 (×7): 5 mg via ORAL
  Filled 2024-10-22 (×6): qty 1

## 2024-10-22 MED ORDER — FUROSEMIDE 10 MG/ML IJ SOLN
80.0000 mg | Freq: Once | INTRAMUSCULAR | Status: AC
  Administered 2024-10-22: 80 mg via INTRAVENOUS
  Filled 2024-10-22: qty 8

## 2024-10-22 MED ORDER — COLCHICINE 0.6 MG PO TABS
0.6000 mg | ORAL_TABLET | Freq: Every day | ORAL | Status: DC
  Administered 2024-10-23 – 2024-10-28 (×6): 0.6 mg via ORAL
  Filled 2024-10-22 (×5): qty 1

## 2024-10-22 MED ORDER — ALLOPURINOL 100 MG PO TABS
100.0000 mg | ORAL_TABLET | Freq: Every day | ORAL | Status: DC
  Administered 2024-10-23 – 2024-10-28 (×6): 100 mg via ORAL
  Filled 2024-10-22 (×5): qty 1

## 2024-10-22 MED ORDER — SPIRONOLACTONE 12.5 MG HALF TABLET: 12.5000 mg | ORAL_TABLET | Freq: Every day | ORAL | Status: DC

## 2024-10-22 MED ORDER — POTASSIUM CHLORIDE CRYS ER 20 MEQ PO TBCR
40.0000 meq | EXTENDED_RELEASE_TABLET | Freq: Once | ORAL | Status: AC
  Administered 2024-10-22: 40 meq via ORAL
  Filled 2024-10-22: qty 2

## 2024-10-22 NOTE — Assessment & Plan Note (Signed)
 Admitted last month for similar presentation, appears grossly hypervolemic upon exam despite medication compliance.  Up around 15 pounds from dry weight.  Per most recent cardiology note in 09/04/2024, referred to advanced heart failure clinic given symptom severity and difficulty tolerating GDMT therefore will likely need to be engaged this hospitalization.  Good diuresis with Lasix  80 mg IV in the ED, will repeat dose and replete electrolytes as needed. -Admitted to [facility], Dr. Donzetta attending -Advanced heart failure team consult in the a.m. -Lasix  80 mg IV twice daily -Daily weights and strict I's and O's -Cardiac telemetry -BMP, mag -Cautiously continue GDMT with metoprolol  and spironolactone , monitor BP closely and add losartan  as indicated

## 2024-10-22 NOTE — Progress Notes (Deleted)
    SUBJECTIVE:   CHIEF COMPLAINT / HPI:   ***  PERTINENT  PMH / PSH: ***  OBJECTIVE:   There were no vitals taken for this visit.  ***  ASSESSMENT/PLAN:   Assessment & Plan      Milda LITTIE Deed, MD [facility]

## 2024-10-22 NOTE — Patient Instructions (Signed)
 VISIT SUMMARY: Today, you were seen for swelling in your legs, blood in your urine and stool, and difficulty breathing. Given the severity of your symptoms, you have been referred to the hospital for immediate care.  YOUR PLAN: VOLUME OVERLOAD WITH LOWER EXTREMITY EDEMA AND DYSPNEA: You have severe swelling in your legs and difficulty breathing that has not improved with your current medication. -You are being referred to the hospital for IV Lasix  treatment to help reduce the fluid buildup.  URINARY TRACT INFECTION WITH HEMATURIA: You likely have an acute urinary tract infection with blood in your urine and pain. -You are being referred to the hospital for further evaluation and management since you cannot give a urine sample here.  MELENA: You have blood in your stool, which requires further investigation. -You are being referred to the hospital for further evaluation and management.  Please let me know if you have any other questions.  Dr. Tharon

## 2024-10-22 NOTE — Assessment & Plan Note (Signed)
 Gout: Continue home allopurinol  100 mg daily and colchicine  0.6 mg daily HLD: Continue home ezetimibe  10 mg daily A-fib: Rate controlled.  Continue home Eliquis  5 mg twice daily Chronic pain syndrome: Continue home oxycodone  5 mg every 6 hours as needed

## 2024-10-22 NOTE — Assessment & Plan Note (Signed)
 One episode of blood-streaked urine noted at office visit earlier today, denies any other bloody stools or melena.  Rectal exam performed, likely internal hemorrhoid noted as source of bleed.  Additionally has skin tear and sacral site, possible other source of bleeding. -FOBT -Wound care consult for sacral skin breakdown

## 2024-10-22 NOTE — ED Provider Notes (Signed)
 "  Emergency Department Provider Note   I have reviewed the triage vital signs and the nursing notes.   HISTORY  Chief Complaint Shortness of Breath   HPI Donna Herrera is a 78 y.o. female with past history reviewed below including CHF presents to the emergency department with increasing lower extremity edema and progressively worsening shortness of breath.  Symptoms been progressing over the past several days to weeks.  She was able to make it to her PCP appointment but was redirected to the emergency department given the level of swelling.  She doubled her Lasix  within the past 2 days but states the fluid is not coming off.  No fevers.  No chest pain.   Past Medical History:  Diagnosis Date   Acute exacerbation of CHF (congestive heart failure) (HCC) 02/09/2024   Adenomatous polyp 03/23/2020   Colonoscopy 2021 notable for adenomatous polyp. Per GI, no further follow up colonoscopies recommended given patient's age.   Allergy    Arthritis    Atherosclerosis of aorta 07/15/2021   Candidiasis, mouth 10/19/2017   CHF exacerbation (HCC) 08/29/2024   Chronic combined systolic and diastolic CHF (congestive heart failure) (HCC) 10/21/2015   a. 12/2014 Echo: EF 40%; b. 10/2015 Echo: EF 25-40%; c. 02/2018 Echo: EF 15-20%; d. 01/2023 Echo: EF 50-55%; e. 12/2023 Echo: EF 20-25%, glob HK, RVSP , mod BAE, triv MR, mod TR.   Chronic left shoulder pain 04/10/2013   Chronic lung disease    Chronic pain    Chronic pain syndrome 07/20/2017   Confusion 06/08/2021   Depression 08/25/2016   DJD (degenerative joint disease) 10/12/2012   Multiple joint replacements by Dr. Carlos.    Dyshidrotic eczema 12/31/2014   Edentulous 07/19/2021   Emphysema lung (HCC) 03/02/2021   Seva Chancy smoking history.  Noted on CT Low Dose Chest: 01/2021: IMPRESSION: Chronic interstitial changes raise the question of pulmonary fibrosis. High-resolution chest CT may prove helpful to further evaluate as  clinically warranted.Emphysema (ICD10-J43.9) and Aortic Atherosclerosis (ICD10-170.0)  Recommended pulmonology referral but patient declined. Recommend continued discussion and symptom monitor   Essential hypertension 07/20/2017   GERD (gastroesophageal reflux disease)    Gout    H/O slipped capital femoral epiphysis (SCFE)    Heart failure, left, with LVEF 41-49% (HCC) 10/21/2015   transthoracic echocardiogram 06/06/18: EF appears improved to 40-45% with diffuse hypokinesis  A. Echo 3/16: EF 40%, diffuse HK, mild MR, moderate LAE, mild RAE, PASP 42 mmHg  //  B. Echo 12/16: Mild LVH, EF 35-40%, diffuse HK, grade 2 diastolic dysfunction, MAC, mild MR, moderate LAE, normal RVSF, PASP 49 mmHg   History of total right hip replacement 04/02/2019   Followed closely with orthopedist Dr. Ernie. Patient is s/p total right hip replacement in 1980's with evidence of bone loss and osteopenia making reconstruction unlikely. Plan for conservative treatment at this time with crutches for ambulation and pain control.   Hx of surgical fusion joint 12/23/2022   Hyperlipidemia    Hypertension    Hypertensive heart disease with CHF (congestive heart failure) (HCC) 10/12/2012   Loss of taste 02/06/2018   Mass of breast, left 07/21/2014   Mechanical failure of prosthetic joint, initial encounter 12/23/2022   NICM (nonischemic cardiomyopathy) (HCC) 06/09/2016   a. 04/2016 Cath: Normal coronary arteries, EF 30-35%, LVEDP 14 mmHg; b. 12/2014 Echo: EF 40%; c. 10/2015 Echo: EF 25-40%; d. 02/2018 Echo: EF 15-20%; e. 01/2023 Echo: EF 50-55%; f. 12/2023 Echo: EF 20-25%, glob HK, RVSP , mod BAE, triv MR,  mod TR.   Pain in gums 04/18/2018   Persistent atrial fibrillation (HCC)    a. 12/2023 s/p TEE/DCCV - 200J-->tikosyn  loaded; b. 01/2024 ERAF->tikosyn  d/c'd.   Pre-diabetes    Pulmonary fibrosis (HCC) 03/02/2021   CT Low Dose Chest: 01/2021: IMPRESSION: Chronic interstitial changes raise the question of pulmonary fibrosis.  High-resolution chest CT may prove helpful to further evaluate as clinically warranted.Emphysema (ICD10-J43.9) and Aortic Atherosclerosis (ICD10-170.0)  Recommended pulmonology referral but patient declined. Recommend continued discussion and symptom monitoring.   Pulmonary nodule 1 cm or greater in diameter 03/02/2021   CT low dose Chest: 01/2021: 1. Lung-RADS 4A, suspicious. 10 mm posterior left lower lobe pulmonary nodule, possibly scarring. Follow up low-dose chest CT without contrast in 3 months (please use the following order, CT CHEST LCS NODULE FOLLOW-UP W/O CM) is recommended. Alternatively, PET may be considered when there is a solid component 8mm or larger.  Plan: Follow up low dose CT ordered in 3 mon   Rib pain on left side 09/17/2020   Right hip pain 11/02/2012   Right upper quadrant pain 08/29/2024   Tendonitis, Achilles, left 05/24/2017   Tobacco abuse 09/29/2014   Urge incontinence 07/19/2021   Vitamin D  deficiency 12/31/2014    Review of Systems  Constitutional: No fever/chills Cardiovascular: Denies chest pain. Positive LE swelling.  Respiratory: Positive shortness of breath. Gastrointestinal: No abdominal pain.  No nausea, no vomiting.   Musculoskeletal: Negative for back pain. Skin: Negative for rash. Neurological: Negative for headaches.  ____________________________________________   PHYSICAL EXAM:  VITAL SIGNS: ED Triage Vitals  Encounter Vitals Group     BP 10/22/24 1807 109/67     Pulse Rate 10/22/24 1812 76     Resp 10/22/24 1805 18     Temp 10/22/24 1818 98.4 F (36.9 C)     Temp Source 10/22/24 1818 Oral     SpO2 10/22/24 1812 100 %     Weight 10/22/24 1757 160 lb (72.6 kg)     Height 10/22/24 1757 5' 6 (1.676 m)   Constitutional: Alert and oriented. Well appearing and in no acute distress. Eyes: Conjunctivae are normal.  Head: Atraumatic. Nose: No congestion/rhinnorhea. Mouth/Throat: Mucous membranes are moist.   Neck: No stridor.    Cardiovascular: Normal rate, regular rhythm. Good peripheral circulation. Grossly normal heart sounds.   Respiratory: Normal respiratory effort.  No retractions. Lungs with crackles at the bases.  Gastrointestinal: Soft and nontender. No distention.  Musculoskeletal: No lower extremity tenderness with 3+ pitting edema bilaterally. No gross deformities of extremities. Neurologic:  Normal speech and language. No gross focal neurologic deficits are appreciated.  Skin:  Skin is warm, dry and intact. No rash noted.   ____________________________________________   LABS (all labs ordered are listed, but only abnormal results are displayed)  Labs Reviewed  URINE CULTURE - Abnormal; Notable for the following components:      Result Value   Culture >=100,000 COLONIES/mL ESCHERICHIA COLI (*)    Organism ID, Bacteria ESCHERICHIA COLI (*)    All other components within normal limits  CBC WITH DIFFERENTIAL/PLATELET - Abnormal; Notable for the following components:   RDW 17.5 (*)    All other components within normal limits  COMPREHENSIVE METABOLIC PANEL WITH GFR - Abnormal; Notable for the following components:   Potassium 3.4 (*)    Chloride 97 (*)    Calcium  8.8 (*)    All other components within normal limits  PRO BRAIN NATRIURETIC PEPTIDE - Abnormal; Notable for the following components:  Pro Brain Natriuretic Peptide 6,767.0 (*)    All other components within normal limits  URINALYSIS, ROUTINE W REFLEX MICROSCOPIC - Abnormal; Notable for the following components:   Color, Urine STRAW (*)    Specific Gravity, Urine 1.004 (*)    Hgb urine dipstick LARGE (*)    Leukocytes,Ua LARGE (*)    Bacteria, UA RARE (*)    All other components within normal limits  BASIC METABOLIC PANEL WITH GFR - Abnormal; Notable for the following components:   Potassium 3.1 (*)    Glucose, Bld 100 (*)    Calcium  8.7 (*)    All other components within normal limits  MAGNESIUM  - Abnormal; Notable for the  following components:   Magnesium  1.6 (*)    All other components within normal limits  BASIC METABOLIC PANEL WITH GFR - Abnormal; Notable for the following components:   Glucose, Bld 105 (*)    Creatinine, Ser 1.06 (*)    GFR, Estimated 53 (*)    All other components within normal limits  BASIC METABOLIC PANEL WITH GFR - Abnormal; Notable for the following components:   Glucose, Bld 104 (*)    Calcium  8.8 (*)    All other components within normal limits  CBC - Abnormal; Notable for the following components:   RDW 17.3 (*)    All other components within normal limits  BASIC METABOLIC PANEL WITH GFR - Abnormal; Notable for the following components:   Chloride 97 (*)    Glucose, Bld 106 (*)    All other components within normal limits  CBC - Abnormal; Notable for the following components:   RDW 17.3 (*)    All other components within normal limits  BASIC METABOLIC PANEL WITH GFR - Abnormal; Notable for the following components:   Chloride 94 (*)    All other components within normal limits  RESP PANEL BY RT-PCR (RSV, FLU A&B, COVID)  RVPGX2  OCCULT BLOOD X 1 CARD TO LAB, STOOL  MAGNESIUM   MAGNESIUM   MAGNESIUM   MAGNESIUM   BASIC METABOLIC PANEL WITH GFR  MAGNESIUM   TROPONIN T, HIGH SENSITIVITY  TROPONIN T, HIGH SENSITIVITY   ____________________________________________  EKG  Undetermined rhythm. No STEMI.    ____________________________________________  RADIOLOGY  DG Chest Portable 1 View Result Date: 10/22/2024 CLINICAL DATA:  Shortness of breath. EXAM: PORTABLE CHEST 1 VIEW COMPARISON:  Chest radiograph dated 08/29/2024. FINDINGS: There is mild cardiomegaly with mild central vascular congestion. No focal consolidation, pleural effusion, or pneumothorax. Left pectoral pacemaker device. No acute osseous pathology. IMPRESSION: Mild cardiomegaly with mild central vascular congestion. Electronically Signed   By: Vanetta Chou M.D.   On: 10/22/2024 20:26     ____________________________________________   PROCEDURES  Procedure(s) performed:   Procedures  CRITICAL CARE Performed by: Fonda KANDICE Law Total critical care time: 35 minutes Critical care time was exclusive of separately billable procedures and treating other patients. Critical care was necessary to treat or prevent imminent or life-threatening deterioration. Critical care was time spent personally by me on the following activities: development of treatment plan with patient and/or surrogate as well as nursing, discussions with consultants, evaluation of patient's response to treatment, examination of patient, obtaining history from patient or surrogate, ordering and performing treatments and interventions, ordering and review of laboratory studies, ordering and review of radiographic studies, pulse oximetry and re-evaluation of patient's condition.  Fonda Law, MD Emergency Medicine  ____________________________________________   INITIAL IMPRESSION / ASSESSMENT AND PLAN / ED COURSE  Pertinent labs & imaging results that  were available during my care of the patient were reviewed by me and considered in my medical decision making (see chart for details).   This patient is Presenting for Evaluation of SOB, which does require a range of treatment options, and is a complaint that involves a high risk of morbidity and mortality.  The Differential Diagnoses include congestive heart failure, anasarca, pulmonary edema, PE, etc.  Critical Interventions-    Medications  allopurinol  (ZYLOPRIM ) tablet 100 mg (100 mg Oral Given 10/26/24 0951)  colchicine  tablet 0.6 mg (0.6 mg Oral Given 10/26/24 0951)  ezetimibe  (ZETIA ) tablet 10 mg (10 mg Oral Given 10/26/24 0951)  apixaban  (ELIQUIS ) tablet 5 mg (5 mg Oral Given 10/26/24 2132)  gabapentin  (NEURONTIN ) capsule 300 mg (300 mg Oral Given 10/26/24 2132)  oxyCODONE  (Oxy IR/ROXICODONE ) immediate release tablet 5 mg (5 mg Oral Given  10/26/24 1602)  DULoxetine  (CYMBALTA ) DR capsule 30 mg (30 mg Oral Given 10/26/24 0951)  cefadroxil  (DURICEF) capsule 1,000 mg (1,000 mg Oral Given 10/26/24 2026)  furosemide  (LASIX ) injection 40 mg (40 mg Intravenous Given 10/26/24 2133)  metoprolol  succinate (TOPROL -XL) 24 hr tablet 25 mg (25 mg Oral Given 10/26/24 1223)  losartan  (COZAAR ) tablet 12.5 mg (12.5 mg Oral Given 10/26/24 1223)  midodrine  (PROAMATINE ) tablet 7.5 mg (7.5 mg Oral Given 10/26/24 1602)  furosemide  (LASIX ) injection 80 mg (80 mg Intravenous Given 10/22/24 2139)  potassium chloride  SA (KLOR-CON  M) CR tablet 40 mEq (40 mEq Oral Given 10/22/24 2339)  magnesium  sulfate IVPB 2 g 50 mL (0 g Intravenous Stopped 10/23/24 0853)  potassium chloride  SA (KLOR-CON  M) CR tablet 40 mEq (40 mEq Oral Given 10/23/24 1019)  iohexol  (OMNIPAQUE ) 350 MG/ML injection 75 mL (75 mLs Intravenous Contrast Given 10/23/24 1038)  potassium chloride  SA (KLOR-CON  M) CR tablet 40 mEq (40 mEq Oral Given 10/23/24 1730)    Reassessment after intervention:  patient starting diuresis.   I decided to review pertinent External Data, and in summary echo from October of this year show EF less than 20%.   Clinical Laboratory Tests Ordered, included CBC without leukocytosis or anemia.  Radiologic Tests Ordered, included CXR. I independently interpreted the images and agree with radiology interpretation.   Cardiac Monitor Tracing which shows paced rhythm.    Social Determinants of Health Risk patient is a non-smoker.   Consult complete with teaching service. Plan for admit.    Medical Decision Making: Summary:  Patient presents emergency department with shortness of breath and significant leg swelling.  She has reduced ejection fraction heart failure and continuing to retain fluid despite increasing her Lasix  at home.  Plan for screening blood work and chest x-ray but will likely require admit for diuresis.  Reevaluation with update and discussion with  patient. Looking well. Agreeable with plan for admit.   Patient's presentation is most consistent with acute presentation with potential threat to life or bodily function.   Disposition: admit  ____________________________________________  FINAL CLINICAL IMPRESSION(S) / ED DIAGNOSES  Final diagnoses:  Anasarca     Note:  This document was prepared using Dragon voice recognition software and may include unintentional dictation errors.  Fonda Law, MD, [facility] Emergency Medicine    Dyasia Firestine, Fonda MATSU, MD 10/26/24 2259  "

## 2024-10-22 NOTE — ED Notes (Signed)
 MD notified of two failed peripheral IV starts. Per MD okay to wait on labs for IV team to place line.

## 2024-10-22 NOTE — Hospital Course (Addendum)
 Donna Herrera is a 78 y.o. female with history of HFrEF, Afib with CRT-P on eliquis , COPD, Pulmonary fibrosis, CAD  who was admitted for acute decompensated heart failure.  Acute Decompensated Heart Failure Transferred from PCP office on 10/22/2024 to ED for concern of orthopnea/bilateral lower extremity edema in setting of known congestive heart failure (LVEF of 20% in October 2025).  ACS workup unremarkable in the ED and breathing/satting well on room air. Patient was volume overloaded, concerning for decompensation of her heart failure. Cardiology consulted given persistent hypervolemia despite adherence to home regimen.  Home GDMT was continued, with the following adjustments: Losartan  12.5mg  once daily, continue metoprolol  succinate 25mg  daily, lasix  60 mg BID. Patient was experiencing some hypotension with medication, so midodrine  was increased prior to discharge. She was diuresed with IV diuretics and transitioned to 60mg  PO Lasix  BID and Midodrine  7.5mg  TID at time of discharge.  Suprapubic pain with hematuria  UTI Pain and hematuria as above x 1 day and UA suggestive of UTI, initiated on ceftriaxone  (12/25-27). CT abdomen pelvis significant for asymmetric perinephric stranding on the left kidney. 2.3 cm cyst in the upper pole of right kidney not requiring follow up. No hydronephrosis. Urinary bladder also distended. Urine culture showed pan sensitive ecoli. Patient was transitioned to cefadroxil  12/27-30.   Rectal bleeding  suspected internal hemorrhoid  One instance of blood streaked in stool during office visit prior to admission, no further episodes while admitted.  FOBT negative and rectal exam with likely internal hemorrhoid as source of bleed. CT abdomen pelvis is significant for total colonic diverticulosis but no further episodes of bleeding occurred.   Other chronic conditions were medically managed with home medications and formulary alternatives as necessary (COPD, Afib,  CAD)  Follow-up recommendations Ensure cardiology follow-up with advanced heart failure team Severe osteopenia on CT, follow up outpatient.  Severe bony destruction of the right acetabulum with unchanged bony reabsorption of the proximal femur. 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 Diffuse osteopenia. Multilevel degenerative disc disease of the spine. Follow up with GI if patient has further rectal bleeding given diverticulosis. Last colonoscopy in 2021 without recommendation for repeat screening colonoscopy given patient's age.  Consider repeating BMP and CBC

## 2024-10-22 NOTE — H&P (Signed)
 "    Hospital Admission History and Physical Service Pager: [phone]  Patient name: Donna Herrera Medical record number: [id number] Date of Birth: 1946-01-03 Age: 78 y.o. Gender: female  Primary Care Provider: Lonnie Earnest, MD Consultants: None Code Status: DNR/DNI Preferred Emergency Contact: *Primary contact is [facility] Information     Name Relation Home Work Mobile   Ene,Skylar Granddaughter [phone]  [phone]   MACARIO MALCOLM Silk   [phone]   Trudy Counts Relative [phone]  [phone]      Other Contacts   None on File      Chief Complaint: SOB  Assessment and Plan: Donna Herrera is a 78 y.o. female with PMHx HFrEF (EF <20%), HTN, permanent A-fib, gout, GERD, HLD, COPD, prediabetes and pulmonary fibrosis presenting with shortness of breath.   Differential for presentation of this includes: Heart failure exacerbation: Most likely given hypervolemia with severe HFrEF despite medication compliance. PE: Compliant with Eliquis , has not missed any doses.  Well score 0. Pneumonia: Denies infectious symptoms and no focal consolidation upon exam. COPD exacerbation: Does not meet criteria, denies increasing or change in sputum production. Assessment & Plan Acute exacerbation of CHF (congestive heart failure) (HCC) HFrEF (heart failure with reduced ejection fraction) (HCC) Admitted last month for similar presentation, appears grossly hypervolemic upon exam despite medication compliance.  Up around 15 pounds from dry weight.  Per most recent cardiology note in 09/04/2024, referred to advanced heart failure clinic given symptom severity and difficulty tolerating GDMT therefore will likely need to be engaged this hospitalization.  Good diuresis with Lasix  80 mg IV in the ED, will repeat dose and replete electrolytes as needed. -Admitted to FMTS, Dr. Donzetta attending -Advanced heart failure team consult in the a.m. -Lasix  80 mg IV twice  daily -Daily weights and strict I's and O's -Cardiac telemetry -BMP, mag -Cautiously continue GDMT with metoprolol  and spironolactone , monitor BP closely and add losartan  as indicated Suprapubic pain Noted upon exam associated with one episode of hematuria today.  Will obtain urine studies due to concern for UTI.  May need CTAP if no clear source given tenderness upon exam. -UA -Consider CTAP BRBPR (bright red blood per rectum) One episode of blood-streaked urine noted at office visit earlier today, denies any other bloody stools or melena.  Rectal exam performed, likely internal hemorrhoid noted as source of bleed.  Additionally has skin tear and sacral site, possible other source of bleeding. -FOBT -Wound care consult for sacral skin breakdown Chronic health problem Gout: Continue home allopurinol  100 mg daily and colchicine  0.6 mg daily HLD: Continue home ezetimibe  10 mg daily A-fib: Rate controlled.  Continue home Eliquis  5 mg twice daily Chronic pain syndrome: Continue home oxycodone  5 mg every 6 hours as needed   FEN/GI: Heart healthy VTE Prophylaxis: Eliquis   Disposition: Cardiac tele  History of Present Illness:  Donna Herrera is a 78 y.o. female presenting with shortness of breath.  Patient reports her shortness of breath worsened 2 days ago, no inciting events.  Denies any medication changes or missing any doses.  Denies any recent illness.  States she has been taking Lasix  80 mg in the morning and 60 mg in the evening but has not been urinating much.  Reports her legs have been very puffy even since she was discharged from the hospital a month ago.  Able to get up and move around some but has significant dyspnea on exertion.  Sister-in-law reports they have not been able to see the advanced heart failure clinic.  Denies  chest pain  Additionally reports she had an episode of blood in her urine this morning.  Also reports she was having suprapubic pain that began  today.  Reports this is why she scheduled an office visit was to get antibiotics for UTI.  Daughter-in-law also notes while she was trying to give a urine sample at the office visit that she had a BM and noted to have a streak of blood in it.  Notes this is the first occurrence, denies any other blood per rectum or melena.  Reports she has had some breakdown in the skin around her sacrum due to pressure.  Presents with daughter-in-law who provides additional history.  In the ED, noted to be hypervolemic with pulmonary congestion on CXR.  Given Lasix  80 mg IV x 1 and admitted for further diuresis.  Review Of Systems: Per HPI  Pertinent Past Medical History: HTN HFrEF secondary to NICM (EF <20%) Permanent A-fib Gout GERD HLD Prediabetes COPD Pulmonary fibrosis Remainder reviewed in history tab.   Pertinent Past Surgical History: NICM Right total hip arthroplasty Bilateral rotator cuff repair C-section, BTL Remainder reviewed in history tab.   Pertinent Social History: Tobacco use: Yes/No/Former Alcohol use: None Other Substance use: None Lives with daughter, granddaughter  Pertinent Family History: Remainder reviewed in history tab.   Important Outpatient Medications: Allopurinol  100 mg daily Colchicine  0.6 mg daily Eliquis  5 mg twice daily Ezetimibe  10 mg daily Famotidine  20 mg daily as needed Ferrous sulfate  weekly Lasix  40 mg twice daily Gabapentin  300 mg nightly as needed Losartan  25 mg daily? Metoprolol  tartrate 50 mg twice daily Oxycodone  5 mg every 6 hours as needed Potassium 10 meq daily Spironolactone  12.5 mg daily  Remainder reviewed in medication history.   Objective: BP 109/67   Pulse 76   Temp 98.7 F (37.1 C) (Oral)   Resp 18   Ht 5' 6 (1.676 m)   Wt 72.6 kg   SpO2 100%   BMI 25.82 kg/m  Exam: General: Elderly female laying in bed: Alert, NAD Eyes: PERRLA, anicteric sclera ENTM: Moist mucus membranes. Neck: Supple,  non-tender Cardiovascular: Irregular rhythm.  Rate controlled. Respiratory: Normal WOB on RA.  Crackles at bilateral bases Gastrointestinal: Soft, nondistended.  Tender to palpation diffusely, worse in suprapubic region and lower abdomen.  Rectal exam performed, nonthrombosed internal hemorrhoid noted. MSK: 3+ bilateral edema to the level of the hip. Derm: Warm, dry.  2 superficial skin tears noted in sacral region without surrounding erythema or purulent drainage. Neuro: CN intact. Motor and sensation intact globally Psych: Cooperative, pleasant     Labs:  CBC BMET  Recent Labs  Lab 10/22/24 1900  WBC 4.7  HGB 12.9  HCT 40.7  PLT 251   Recent Labs  Lab 10/22/24 2030  NA 136  K 3.4*  CL 97*  CO2 26  BUN 15  CREATININE 0.74  GLUCOSE 84  CALCIUM  8.8*    Pertinent additional labs: BNP: 6767 Troponin: 18  EKG: Undetermined rhythm.  Rate controlled.   Imaging Studies Performed: DG Chest Portable 1 View Result Date: 10/22/2024 CLINICAL DATA:  Shortness of breath. EXAM: PORTABLE CHEST 1 VIEW COMPARISON:  Chest radiograph dated 08/29/2024. FINDINGS: There is mild cardiomegaly with mild central vascular congestion. No focal consolidation, pleural effusion, or pneumothorax. Left pectoral pacemaker device. No acute osseous pathology. IMPRESSION: Mild cardiomegaly with mild central vascular congestion. Electronically Signed   By: Vanetta Chou M.D.   On: 10/22/2024 20:26      Theophilus Pagan, MD  10/22/2024, 9:23 PM PGY-3, [place]  FPTS Intern pager: [phone], text pages welcome Secure chat group [facility] Teaching Service  "

## 2024-10-22 NOTE — Assessment & Plan Note (Signed)
 Noted upon exam associated with one episode of hematuria today.  Will obtain urine studies due to concern for UTI.  May need CTAP if no clear source given tenderness upon exam. -UA -Consider CTAP

## 2024-10-22 NOTE — ED Triage Notes (Signed)
 Patient arrives via [facility] from family practice for shob and lower extremity edema, doubled lasix  at home no improvement. Left leg +3 edema, right leg  +2 edema. Crackles in bases, 98 on room air. RR 22, BP 116/74, HR 60s, demand pacer. Alert oriented x4.

## 2024-10-22 NOTE — Progress Notes (Addendum)
" ° °  SUBJECTIVE:   CHIEF COMPLAINT / HPI:  Discussed the use of AI scribe software for clinical note transcription with the patient, who gave verbal consent to proceed.  History of Present Illness Donna Herrera is a 78 year old female who presents with swelling in her legs and blood in her urine and stool.  Lower extremity edema and pain - Progressive swelling of feet and legs, now extending to knees and thighs - Associated pain in affected areas affecting ability to walk - Edema is worse than usual despite taking four Lasix  in the morning and three in the evening (prescribed at last visit with PCP a month ago) - Similar to previous episode that required hospitalization last month for fluid retention  Hematuria and hematochezia - New onset bright red blood in urine and stool observed this morning - Expresses concern regarding these findings - Feels more dyspneic as below  Dyspnea - Labored breathing at times, especially with exertion such as getting into the car - Occasionally requires use of inhaler - Symptoms improve somewhat with rest though are more and more persistent - Cannot lie down due to difficulty breathing    PERTINENT  PMH / PSH: echo 08/30/24 with LVEF <20%  OBJECTIVE:  BP 116/70   Pulse 98   SpO2 93%   Physical Exam GENERAL: Alert, cooperative, chronically ill appearing CHEST: Clear to auscultation bilaterally, no wheezes, rhonchi, or crackles, patient mildly dyspneic with sentences on RA CARDIOVASCULAR: Normal heart rate and rhythm, S1 and S2 normal without murmurs. EXTREMITIES: Severe 3+ pitting edema of bilateral extremities with non-pitting edema extending to mid-thigh NEUROLOGICAL: Cranial nerves grossly intact, moves all extremities without gross motor or sensory deficit, using walker  ASSESSMENT/PLAN:   Assessment & Plan Acute on chronic systolic heart failure (HCC) Severe lower extremity edema and orthopnea unresponsive to oral Lasix . Recent  hospitalization for similar symptoms. SpO2 and exam today overall reassuring, though with her history of EF <20% in the setting of refractory edema and dyspnea to high-dose oral lasix  use, recommend ED evaluation. - Referred to ED by [facility] for assessment and likely admission for IV diuretic administration. Dysuria Likely acute UTI with hematuria and pain. Unable to leave urine sample in the office. - Recommend UA and culture in hospital to guide further treatment Blood in stool Observed by CMA. Bright red streak in stool. Started today. Likely hemorrhoids given appearance. - Recommend CBC in ED and consideration of GI consult for workup since she is on Eliquis   Stuart Redo, MD [facility] "

## 2024-10-23 ENCOUNTER — Inpatient Hospital Stay (HOSPITAL_COMMUNITY)

## 2024-10-23 ENCOUNTER — Encounter (HOSPITAL_COMMUNITY): Payer: Self-pay | Admitting: Family Medicine

## 2024-10-23 DIAGNOSIS — I4819 Other persistent atrial fibrillation: Secondary | ICD-10-CM

## 2024-10-23 DIAGNOSIS — I10 Essential (primary) hypertension: Secondary | ICD-10-CM

## 2024-10-23 DIAGNOSIS — L89152 Pressure ulcer of sacral region, stage 2: Secondary | ICD-10-CM | POA: Diagnosis present

## 2024-10-23 DIAGNOSIS — Z950 Presence of cardiac pacemaker: Secondary | ICD-10-CM

## 2024-10-23 DIAGNOSIS — I5023 Acute on chronic systolic (congestive) heart failure: Secondary | ICD-10-CM | POA: Diagnosis not present

## 2024-10-23 DIAGNOSIS — I5021 Acute systolic (congestive) heart failure: Secondary | ICD-10-CM

## 2024-10-23 DIAGNOSIS — I5082 Biventricular heart failure: Secondary | ICD-10-CM | POA: Diagnosis not present

## 2024-10-23 DIAGNOSIS — E785 Hyperlipidemia, unspecified: Secondary | ICD-10-CM

## 2024-10-23 DIAGNOSIS — I428 Other cardiomyopathies: Secondary | ICD-10-CM

## 2024-10-23 DIAGNOSIS — R9431 Abnormal electrocardiogram [ECG] [EKG]: Secondary | ICD-10-CM

## 2024-10-23 DIAGNOSIS — L8990 Pressure ulcer of unspecified site, unspecified stage: Secondary | ICD-10-CM | POA: Insufficient documentation

## 2024-10-23 LAB — BASIC METABOLIC PANEL WITH GFR
Anion gap: 9 (ref 5–15)
Anion gap: 11 (ref 5–15)
BUN: 16 mg/dL (ref 8–23)
BUN: 15 mg/dL (ref 8–23)
CO2: 30 mmol/L (ref 22–32)
CO2: 28 mmol/L (ref 22–32)
Calcium: 8.7 mg/dL — ABNORMAL LOW (ref 8.9–10.3)
Calcium: 9 mg/dL (ref 8.9–10.3)
Chloride: 100 mmol/L (ref 98–111)
Chloride: 99 mmol/L (ref 98–111)
Creatinine, Ser: 0.81 mg/dL (ref 0.44–1.00)
Creatinine, Ser: 1.06 mg/dL — ABNORMAL HIGH (ref 0.44–1.00)
GFR, Estimated: >60 mL/min
GFR, Estimated: 53 mL/min — ABNORMAL LOW
Glucose, Bld: 100 mg/dL — ABNORMAL HIGH (ref 70–99)
Glucose, Bld: 105 mg/dL — ABNORMAL HIGH (ref 70–99)
Potassium: 3.1 mmol/L — ABNORMAL LOW (ref 3.5–5.1)
Potassium: 4.5 mmol/L (ref 3.5–5.1)
Sodium: 139 mmol/L (ref 135–145)
Sodium: 138 mmol/L (ref 135–145)

## 2024-10-23 LAB — URINALYSIS, ROUTINE W REFLEX MICROSCOPIC
Bilirubin Urine: NEGATIVE
Glucose, UA: NEGATIVE mg/dL
Ketones, ur: NEGATIVE mg/dL
Nitrite: NEGATIVE
Protein, ur: NEGATIVE mg/dL
Specific Gravity, Urine: 1.004 — ABNORMAL LOW (ref 1.005–1.030)
WBC, UA: >50 WBC/hpf (ref 0–5)
pH: 7 (ref 5.0–8.0)

## 2024-10-23 LAB — MAGNESIUM
Magnesium: 1.6 mg/dL — ABNORMAL LOW (ref 1.7–2.4)
Magnesium: 2.1 mg/dL (ref 1.7–2.4)

## 2024-10-23 LAB — OCCULT BLOOD X 1 CARD TO LAB, STOOL: Fecal Occult Bld: NEGATIVE

## 2024-10-23 MED ORDER — POTASSIUM CHLORIDE CRYS ER 20 MEQ PO TBCR
40.0000 meq | EXTENDED_RELEASE_TABLET | ORAL | Status: AC
  Administered 2024-10-23 (×2): 40 meq via ORAL
  Filled 2024-10-23 (×2): qty 2

## 2024-10-23 MED ORDER — SODIUM CHLORIDE 0.9 % IV SOLN
1.0000 g | INTRAVENOUS | Status: DC
  Administered 2024-10-23: 1 g via INTRAVENOUS
  Filled 2024-10-23: qty 10

## 2024-10-23 MED ORDER — POTASSIUM CHLORIDE CRYS ER 20 MEQ PO TBCR
40.0000 meq | EXTENDED_RELEASE_TABLET | Freq: Once | ORAL | Status: AC
  Administered 2024-10-23: 40 meq via ORAL
  Filled 2024-10-23: qty 2

## 2024-10-23 MED ORDER — MIDODRINE HCL 5 MG PO TABS
5.0000 mg | ORAL_TABLET | Freq: Three times a day (TID) | ORAL | Status: DC
  Administered 2024-10-23 – 2024-10-26 (×8): 5 mg via ORAL
  Filled 2024-10-23 (×8): qty 1

## 2024-10-23 MED ORDER — IOHEXOL 350 MG/ML SOLN
75.0000 mL | Freq: Once | INTRAVENOUS | Status: AC | PRN
  Administered 2024-10-23: 75 mL via INTRAVENOUS

## 2024-10-23 MED ORDER — SODIUM CHLORIDE 0.9 % IV SOLN
2.0000 g | INTRAVENOUS | Status: DC
  Administered 2024-10-24 – 2024-10-26 (×3): 2 g via INTRAVENOUS
  Filled 2024-10-23 (×3): qty 20

## 2024-10-23 MED ORDER — MAGNESIUM SULFATE 2 GM/50ML IV SOLN
2.0000 g | Freq: Once | INTRAVENOUS | Status: AC
  Administered 2024-10-23: 2 g via INTRAVENOUS
  Filled 2024-10-23: qty 50

## 2024-10-23 NOTE — Assessment & Plan Note (Signed)
 Gout: Continue home allopurinol  100 mg daily and colchicine  0.6 mg daily HLD: Continue home ezetimibe  10 mg daily A-fib: Rate controlled.  Continue home Eliquis  5 mg twice daily Chronic pain syndrome: Continue home oxycodone  5 mg every 6 hours as needed Sacral pressure ulcer: wound care and frequent repositioning Neuropathy: Restart home duloxetine  30 mg daily

## 2024-10-23 NOTE — Plan of Care (Signed)

## 2024-10-23 NOTE — Assessment & Plan Note (Signed)
 Good UOP with IV Lasix , will continue to aggressively diurese and follow-up cardiology recommendations.  Mildly hypotensive overnight, will continue to monitor BP closely. - Lasix  80 mg IV BID - Daily weights and strict I&O's - Continuous cardiac telemetry - Hold GDMT metoprolol  and spironolactone  given soft BP, monitor BP closely, defer to cards on restarting - Consult to Cardiology, possibly HF team, follow up recs - Daily BMP and mag, goal K>4 and Mag>2

## 2024-10-23 NOTE — Assessment & Plan Note (Addendum)
 Symptomatic and UA supports UTI.  Urine Cx in process. - CTX 1 g daily, increase to 2 g if concerning CTAP - CTAP given extent of pain

## 2024-10-23 NOTE — Progress Notes (Signed)
 Orthopedic Tech Progress Note Patient Details:  Donna Herrera 24-Dec-1945 [id number] UB order is in place but ortho techs will apply 12/25 to stay consistent with Monday/Thursday schedule. Patient ID: Donna Herrera, female   DOB: May 29, 1946, 78 y.o.   MRN: [id number]  Giovanni LITTIE Lukes 10/23/2024, 7:34 PM

## 2024-10-23 NOTE — Assessment & Plan Note (Addendum)
 Good UOP with IV Lasix , will continue to aggressively diurese and follow-up cardiology recommendations.  Mildly hypotensive overnight, will continue to monitor BP closely. - Lasix  80 mg IV BID - Daily weights and strict I&O's - Continuous cardiac telemetry - Hold GDMT metoprolol  and spironolactone  given soft BP, monitor BP closely, defer to cards on restarting - Consult to Cardiology, possibly HF team, follow up recs - Daily BMP and mag, goal K>4 and Mag>2

## 2024-10-23 NOTE — Progress Notes (Signed)
 "  Daily Progress Note Intern Pager: [phone]  Patient name: Donna Herrera Medical record number: [id number] Date of birth: July 04, 1946 Age: 78 y.o. Gender: female  Primary Care Provider: Lonnie Earnest, MD Consultants: Cardiology Code Status: DNR/DNI  Pt Overview and Major Events to Date:  12/23 - Admitted to FMTS in HF exacerbation 12/24 - Consult to Cardiology and HF team  Assessment and Plan: Donna Herrera is a 78 y.o. female with PMHx HFrEF (EF <20%), HTN, permanent A-fib, gout, GERD, HLD, COPD, prediabetes and pulmonary fibrosis presenting with shortness of breath in the setting of likely heart failure exacerbation. Assessment & Plan Acute exacerbation of CHF (congestive heart failure) (HCC) HFrEF (heart failure with reduced ejection fraction) (HCC) Good UOP with IV Lasix , will continue to aggressively diurese and follow-up cardiology recommendations.  Mildly hypotensive overnight, will continue to monitor BP closely. - Lasix  80 mg IV BID - Daily weights and strict I&O's - Continuous cardiac telemetry - Hold GDMT metoprolol  and spironolactone  given soft BP, monitor BP closely, defer to cards on restarting - Consult to Cardiology, possibly HF team, follow up recs - Daily BMP and mag, goal K>4 and Mag>2 Suprapubic pain Urinary tract infection Symptomatic and UA supports UTI.  Urine Cx in process. - CTX 1 g daily, increase to 2 g if concerning CTAP - CTAP given extent of pain Pressure ulcer Moisture-associated skin damage, present on arrival. - Wound care per WOC recommendations - Barrier cream and frequent repositioning every 2 hours BRBPR (bright red blood per rectum) x1 episode in clinic prior to admission.  FOBT negative, no further episodes of blood per rectum.  Internal hemorrhoid on DRE.  No concern for GI bleed.  Will continue wound care for sacral skin tear site and monitor clinically. - AM CBC Chronic health problem Gout: Continue home  allopurinol  100 mg daily and colchicine  0.6 mg daily HLD: Continue home ezetimibe  10 mg daily A-fib: Rate controlled.  Continue home Eliquis  5 mg twice daily Chronic pain syndrome: Continue home oxycodone  5 mg every 6 hours as needed  FEN/GI: Heart healthy PPx: Eliquis  Dispo: Pending cardiology evaluation and further diuresis  Subjective:  This AM, patient denies dyspnea, chest pain, palpitations.  She endorses significant lower extremity edema and reports abdominal discomfort with some dysuria.  States she is urinating frequently.  Denies back pain.  Objective: Temp:  [97.9 F (36.6 C)-98.7 F (37.1 C)] 98.4 F (36.9 C) (12/24 0723) Pulse Rate:  [62-98] 74 (12/24 0720) Resp:  [13-18] 15 (12/24 0720) BP: (95-116)/(45-70) 99/58 (12/24 0720) SpO2:  [93 %-100 %] 100 % (12/24 0720) Weight:  [72.6 kg] 72.6 kg (12/23 1757)  General: Age-appropriate, resting comfortably in chair, NAD, alert and at baseline. HEENT: MMM. Cardiovascular: JVD to 5 cm above clavicle.  Irregular rhythm, normal rate. Normal S1/S2. No murmurs, rubs, or gallops appreciated. 2+ radial pulses. Pulmonary: Few scattered crackles to ascultation. No wheezes or rhonchi. No increased WOB, no accessory muscle usage on room air. Abdominal: Suprapubic and lower abdominal significant tenderness to palpation with some guarding but no rebound.  No CVA tenderness. Skin: Warm and dry.  1st stage pressure ulcer on sacrum. Extremities: 3+ peripheral pitting edema bilaterally to thigh. Capillary refill <2 seconds.   Laboratory: Most recent CBC Lab Results  Component Value Date   WBC 4.7 10/22/2024   HGB 12.9 10/22/2024   HCT 40.7 10/22/2024   MCV 90.4 10/22/2024   PLT 251 10/22/2024   Most recent BMP    Latest Ref Rng & Units  10/23/2024    6:04 AM  BMP  Glucose 70 - 99 mg/dL 899   BUN 8 - 23 mg/dL 16   Creatinine 9.55 - 1.00 mg/dL 9.18   Sodium 864 - 854 mmol/L 139   Potassium 3.5 - 5.1 mmol/L 3.1   Chloride 98 -  111 mmol/L 100   CO2 22 - 32 mmol/L 30   Calcium  8.9 - 10.3 mg/dL 8.7    Other pertinent labs: FOBT: Negative UA: Large hemoglobin, large leukocytes, greater than 50 WBCs, 11-20 RBCs  Imaging/Diagnostic Tests: - Prior CXR with mild vascular congestion  Donna Gallacher, MD 10/23/2024, 7:53 AM  PGY-3, [place] [facility] FPTS Intern pager: [phone], text pages welcome Secure chat group [facility] Teaching Service  "

## 2024-10-23 NOTE — Consult Note (Signed)
 WOC Nurse Consult Note: Reason for Consult: Admitted with CHF exacerbation, edema to lower legs and moisture associated skin damage to sacrum and upper gluteal area.  Fluid overload with diuresis and possible UTI.   Wound type: moisture associated skin damage Pressure Injury POA: Yes pressure and moisture Measurement: coccyx: 0.3 cm nonintact lesion Bilateral upper buttock lesions with partial thickness tissue loss on each side, consistent with moisture associated skin damage from overlapping skin folds in the presence of moisture and incontinence.  Wound bed: pale pink and moist Drainage (amount, consistency, odor) scant weeping  Periwound: frequently moist Dressing procedure/placement/frequency: Barrier cream to sacrococcygeal area and upper buttocks to repel moisture.  Turn and reposition.  Ongoing diuresis  Will not follow at this time.  Please re-consult if needed.  Darice Cooley MSN, RN, FNP-BC CWON Wound, Ostomy, Continence Nurse Outpatient [facility] [phone] Work cell phone:  [phone]

## 2024-10-23 NOTE — Progress Notes (Signed)
 PT Cancellation Note  Patient Details Name: Donna Herrera MRN: [id number] DOB: 1946-07-16   Cancelled Treatment:    Reason Eval/Treat Not Completed: Patient declined, pt reports fatigue and needs to get some sleep.    Rodgers ORN [place] 10/23/2024, 2:26 PM Rodgers Opal PT Acute [employer] [phone]

## 2024-10-23 NOTE — Progress Notes (Signed)
 OT Cancellation Note  Patient Details Name: Donna Herrera MRN: [id number] DOB: [birth year]   Cancelled Treatment:    Reason Eval/Treat Not Completed: Other (comment) (RN reporting pt with incr LE pain due to edema, and per PT recent note, pt refusing to participate due to fatigue. Will continue efforts as able, as schedule permits.)   Johnluke Haugen M. [place], OTR/L [facility] Acute Rehabilitation Services [phone] Secure Chat Preferred  Juanantonio Stolar 10/23/2024, 2:43 PM

## 2024-10-23 NOTE — Progress Notes (Incomplete)
 "    Daily Progress Note Intern Pager: [phone]  Patient name: Donna Herrera Medical record number: [id number] Date of birth: April 24, 1946 Age: 78 y.o. Gender: female  Primary Care Provider: Lonnie Earnest, MD Consultants: Cardiology Code Status: DNR/DNI  Pt Overview and Major Events to Date:  12/23 - Admitted to FMTS in HF exacerbation 12/24 - Consult Cardiology  Assessment and Plan: Donna Herrera is a 78 y.o. female with PMHx HFrEF (EF <20%), HTN, permanent A-fib, gout, GERD, HLD, COPD, prediabetes and pulmonary fibrosis presenting with shortness of breath in the setting of likely heart failure exacerbation.  Assessment & Plan Acute exacerbation of CHF (congestive heart failure) (HCC) HFrEF (heart failure with reduced ejection fraction) (HCC) Good UOP with IV Lasix , will continue to aggressively diurese and follow-up cardiology recommendations.  Mildly hypotensive overnight, will continue to monitor BP closely. - Lasix  80 mg IV BID - Daily weights and strict I&O's - Continuous cardiac telemetry - Hold GDMT metoprolol  and spironolactone  given soft BP, monitor BP closely, defer to cards on restarting - Consult to Cardiology, possibly HF team, follow up recs - Daily BMP and mag, goal K>4 and Mag>2 QT prolongation  Urinary tract infection Symptomatic and UA supports UTI.  Urine Cx in process. - CTX 1 g daily, increase to 2 g if concerning CTAP - CTAP given extent of pain BRBPR (bright red blood per rectum) x1 episode in clinic prior to admission.  FOBT negative, no further episodes of blood per rectum.  Internal hemorrhoid on DRE.  No concern for GI bleed.  Will continue wound care for sacral skin tear site and monitor clinically. - AM CBC Chronic health problem Gout: Continue home allopurinol  100 mg daily and colchicine  0.6 mg daily HLD: Continue home ezetimibe  10 mg daily A-fib: Rate controlled.  Continue home Eliquis  5 mg twice daily Chronic pain syndrome:  Continue home oxycodone  5 mg every 6 hours as needed Sacral pressure ulcer: wound care and frequent repositioning   FEN/GI: Heart healthy PPx: Eliquis  Dispo: Pending cardiology evaluation and further diuresis  Subjective:  ***  Objective: Temp:  [97.9 F (36.6 C)-98.5 F (36.9 C)] 98.4 F (36.9 C) (12/24 1955) Pulse Rate:  [38-76] 70 (12/24 1955) Resp:  [10-22] 15 (12/24 1955) BP: (95-116)/(45-84) 116/72 (12/24 1955) SpO2:  [93 %-100 %] 100 % (12/24 1955) Weight:  [72.8 kg] 72.8 kg (12/24 1259) Physical Exam: General: *** Cardiovascular: *** Respiratory: *** Abdomen: *** Extremities: ***  Laboratory: Most recent CBC Lab Results  Component Value Date   WBC 4.7 10/22/2024   HGB 12.9 10/22/2024   HCT 40.7 10/22/2024   MCV 90.4 10/22/2024   PLT 251 10/22/2024   Most recent BMP    Latest Ref Rng & Units 10/23/2024    6:04 AM  BMP  Glucose 70 - 99 mg/dL 899   BUN 8 - 23 mg/dL 16   Creatinine 9.55 - 1.00 mg/dL 9.18   Sodium 864 - 854 mmol/L 139   Potassium 3.5 - 5.1 mmol/L 3.1   Chloride 98 - 111 mmol/L 100   CO2 22 - 32 mmol/L 30   Calcium  8.9 - 10.3 mg/dL 8.7     Other pertinent labs: Urine culture: pending  Imaging/Diagnostic Tests: CT ABDOMEN PELVIS W CONTRAST Result Date: 10/23/2024 IMPRESSION: 1. Asymmetric perinephric stranding about the left kidney. While this may be chronic, correlation with urinalysis is recommended to exclude an ascending urinary tract infection. No hydronephrosis or nephrolithiasis. 2. Total colonic diverticulosis. No changes of acute diverticulitis. 3. Small  volume free fluid in the pelvis with diffuse anasarca, likely related to patient's volume status.    Theophilus Pagan, MD 10/23/2024, 9:46 PM  PGY-3, [facility] FPTS Intern pager: [phone], text pages welcome Secure chat group [facility] Teaching Service   "

## 2024-10-23 NOTE — Assessment & Plan Note (Signed)
 x1 episode in clinic prior to admission.  FOBT negative, no further episodes of blood per rectum.  Internal hemorrhoid on DRE.  No concern for GI bleed.  Will continue wound care for sacral skin tear site and monitor clinically. - AM CBC

## 2024-10-23 NOTE — Plan of Care (Signed)
 Called to bedside by RN for central chest pain 5/10.  Upon my arrival, EKG was completed.  Patient reported a few minutes of cramping upper abdominal and central chest pain which had self resolved by the time of my arrival.  She denied dyspnea, radiation of pain to left arm or otherwise, or palpitations.  On my review of the EKG, patient has similar ventricular paced rhythm to prior, possibly slightly greater PVC burden, QTcB now prolonged to 541.  Review of telemetry monitoring strip did not show any evidence of persistent arrhythmia.  Will not order troponins given that patient's chest pain self resolved without any red flags.  Low threshold to reevaluate and order troponins if recurring.  Earlier today, K was 3.1 and patient received KCl 80 mEq.  Given that she is receiving aggressive diuresis with 80 mg Lasix  IV BID, her potassium is likely not above goal 4. - Additional 40 mEq KCl - Recheck BMP and Mag 2000, goal K>4, mag>2

## 2024-10-23 NOTE — Assessment & Plan Note (Addendum)
 x1 episode in clinic prior to admission.  FOBT negative, no further episodes of blood per rectum.  Internal hemorrhoid on DRE.  No concern for GI bleed.  Will continue wound care for sacral skin tear site and monitor clinically. - AM CBC

## 2024-10-23 NOTE — Assessment & Plan Note (Signed)
 Symptomatically improved.  Will continue CTX and narrow pending urine culture. - Follow-up urine culture

## 2024-10-23 NOTE — Progress Notes (Signed)
 PT Cancellation Note  Patient Details Name: Donna Herrera MRN: [id number] DOB: 1945/11/24   Cancelled Treatment:    Reason Eval/Treat Not Completed: Patient at procedure or test/unavailable. Pt getting meds and then taken to CT as we were getting started.    [facility] ORN [facility] 10/23/2024, 10:32 AM Rodgers Opal PT Acute Rehabilitation Services Office [phone]

## 2024-10-23 NOTE — Consult Note (Addendum)
 "  Cardiology Consultation   Patient ID: Donna Herrera MRN: [id number]; DOB: Oct 02, 1946  Admit date: 10/22/2024 Date of Consult: 10/23/2024  PCP:  Lonnie Earnest, MD   [facility] Providers Cardiologist:  Vina Gull, MD  Electrophysiologist:  OLE ONEIDA HOLTS, MD      Chief Complaint  Patient presents with   Shortness of Breath   RE: Acute on chronic congestive heart failure  Requesting physician: Dr. Donzetta  Patient Profile: Donna Herrera is a 78 y.o. female with a hx of chronic HFrEF, nonischemic cardiomyopathy, permanent A-fib (s/p CRT-P in 01/2024 and AV nodal ablation in 03/2024), HTN, HLD, COPD, prediabetes, pulmonary fibrosis, and gout who is being seen 10/23/2024 for the evaluation of A-fib and HFrEF at the request of Dr. Rollene Donzetta.  History of Present Illness: Ms. [name] has past medical history as stated above. Of note, she was recently hospitalized on 10/30-11/2 for acute HF at which time she was diuresed with IV Lasix  and discharged on Lasix  40 mg BID, changed to 80 mg in the morning and 40 mg at night by outpatient primary care at hospital follow-up office visit. She follows with outpatient cardiology but has had difficulty tolerating GDMT for HFrEF. Was recently referred to [facility] outpatient clinic but has not been seen yet.  She presented to [facility] on 10/22/24 with complaints of SOB, LE edema/pain, hematuria, and hematochezia. She was referred to [facility] by [facility] for further evaluation and management. She reports SOB x 2 days and progressively worsening BLE edema since discharge from hospitalization a month prior. States that the BLE swelling is worse than usual for her and saw little urine output and no improvement despite taking Lasix  80 mg in the morning and 40-60 mg at night daily for the past four weeks. States shortness of breath is exacerbated by exertion and only sometimes alleviated with rest.  Endorses orthopnea.  Relevant workup this admission: proBNP 6,767. Troponin 17 >> 18. CXR showed mild cardiomegaly with mild central vascular congestion, no pleural effusion. Borderline hypokalemia 3.4. CBC unremarkable. Negative respiratory panel.   Most recent echo (08/30/24): LVEF 16% with global HK, severely dilated LV, RV mildly enlarged with severely reduced systolic function, RVSP 59.9 mmHg, severe BAE, severe TR, dilated IVC  Upon speaking with the patient today, she reports her BLE pain and swelling has been worsening over the past month but that she has been feeling better after receiving two doses of IV Lasix  yesterday/today. She states that her legs feel not as tight and she notes she is easier to move her toes now. Reports good urine output since started on IV Lasix . Endorses orthopnea and occasional SOB. Denies chest pain or palpitations.    Past Medical History:  Diagnosis Date   Acute exacerbation of CHF (congestive heart failure) (HCC) 02/09/2024   Adenomatous polyp 03/23/2020   Colonoscopy 2021 notable for adenomatous polyp. Per GI, no further follow up colonoscopies recommended given patient's age.   Allergy    Arthritis    Atherosclerosis of aorta 07/15/2021   Candidiasis, mouth 10/19/2017   Chronic combined systolic and diastolic CHF (congestive heart failure) (HCC) 10/21/2015   a. 12/2014 Echo: EF 40%; b. 10/2015 Echo: EF 25-40%; c. 02/2018 Echo: EF 15-20%; d. 01/2023 Echo: EF 50-55%; e. 12/2023 Echo: EF 20-25%, glob HK, RVSP , mod BAE, triv MR, mod TR.   Chronic left shoulder pain 04/10/2013   Chronic lung disease    Chronic pain    Chronic pain syndrome  07/20/2017   Confusion 06/08/2021   Depression 08/25/2016   DJD (degenerative joint disease) 10/12/2012   Multiple joint replacements by Dr. Carlos.    Dyshidrotic eczema 12/31/2014   Edentulous 07/19/2021   Emphysema lung (HCC) 03/02/2021   Long smoking history.  Noted on CT Low Dose Chest: 01/2021:  IMPRESSION: Chronic interstitial changes raise the question of pulmonary fibrosis. High-resolution chest CT may prove helpful to further evaluate as clinically warranted.Emphysema (ICD10-J43.9) and Aortic Atherosclerosis (ICD10-170.0)  Recommended pulmonology referral but patient declined. Recommend continued discussion and symptom monitor   Essential hypertension 07/20/2017   GERD (gastroesophageal reflux disease)    Gout    H/O slipped capital femoral epiphysis (SCFE)    Heart failure, left, with LVEF 41-49% (HCC) 10/21/2015   transthoracic echocardiogram 06/06/18: EF appears improved to 40-45% with diffuse hypokinesis  A. Echo 3/16: EF 40%, diffuse HK, mild MR, moderate LAE, mild RAE, PASP 42 mmHg  //  B. Echo 12/16: Mild LVH, EF 35-40%, diffuse HK, grade 2 diastolic dysfunction, MAC, mild MR, moderate LAE, normal RVSF, PASP 49 mmHg   History of total right hip replacement 04/02/2019   Followed closely with orthopedist Dr. Ernie. Patient is s/p total right hip replacement in 1980's with evidence of bone loss and osteopenia making reconstruction unlikely. Plan for conservative treatment at this time with crutches for ambulation and pain control.   Hyperlipidemia    Hypertension    Hypertensive heart disease with CHF (congestive heart failure) (HCC) 10/12/2012   Loss of taste 02/06/2018   Mass of breast, left 07/21/2014   NICM (nonischemic cardiomyopathy) (HCC) 06/09/2016   a. 04/2016 Cath: Normal coronary arteries, EF 30-35%, LVEDP 14 mmHg; b. 12/2014 Echo: EF 40%; c. 10/2015 Echo: EF 25-40%; d. 02/2018 Echo: EF 15-20%; e. 01/2023 Echo: EF 50-55%; f. 12/2023 Echo: EF 20-25%, glob HK, RVSP , mod BAE, triv MR, mod TR.   Pain in gums 04/18/2018   Persistent atrial fibrillation (HCC)    a. 12/2023 s/p TEE/DCCV - 200J-->tikosyn  loaded; b. 01/2024 ERAF->tikosyn  d/c'd.   Pre-diabetes    Pulmonary fibrosis (HCC) 03/02/2021   CT Low Dose Chest: 01/2021: IMPRESSION: Chronic interstitial changes raise the  question of pulmonary fibrosis. High-resolution chest CT may prove helpful to further evaluate as clinically warranted.Emphysema (ICD10-J43.9) and Aortic Atherosclerosis (ICD10-170.0)  Recommended pulmonology referral but patient declined. Recommend continued discussion and symptom monitoring.   Pulmonary nodule 1 cm or greater in diameter 03/02/2021   CT low dose Chest: 01/2021: 1. Lung-RADS 4A, suspicious. 10 mm posterior left lower lobe pulmonary nodule, possibly scarring. Follow up low-dose chest CT without contrast in 3 months (please use the following order, CT CHEST LCS NODULE FOLLOW-UP W/O CM) is recommended. Alternatively, PET may be considered when there is a solid component 8mm or larger.  Plan: Follow up low dose CT ordered in 3 mon   Rib pain on left side 09/17/2020   Right hip pain 11/02/2012   Tendonitis, Achilles, left 05/24/2017   Tobacco abuse 09/29/2014   Urge incontinence 07/19/2021   Vitamin D  deficiency 12/31/2014    Past Surgical History:  Procedure Laterality Date   AV NODE ABLATION N/A 04/01/2024   Procedure: AV NODE ABLATION;  Surgeon: Cindie Ole DASEN, MD;  Location: [facility] INVASIVE CV LAB;  Service: Cardiovascular;  Laterality: N/A;   BIV PACEMAKER INSERTION CRT-P N/A 02/19/2024   Procedure: BIV PACEMAKER INSERTION CRT-P;  Surgeon: Cindie Ole DASEN, MD;  Location: [facility] INVASIVE CV LAB;  Service: Cardiovascular;  Laterality: N/A;  CARDIAC CATHETERIZATION  2005   normal-Hochrein   CARDIAC CATHETERIZATION N/A 05/11/2016   Procedure: Right/Left Heart Cath and Coronary Angiography;  Surgeon: Peter M Jordan, MD;  Location: [facility] INVASIVE CV LAB;  Service: Cardiovascular;  Laterality: N/A;   CARDIOVERSION N/A 12/04/2023   Procedure: CARDIOVERSION;  Surgeon: Barbaraann Darryle Ned, MD;  Location: [facility] INVASIVE CV LAB;  Service: Cardiovascular;  Laterality: N/A;   CESAREAN SECTION     COLONOSCOPY  1990's ??   HIP FUSION Left    left    JOINT REPLACEMENT Bilateral 1991, 1997    [place]   LEAD INSERTION N/A 02/19/2024   Procedure: LEAD INSERTION;  Surgeon: Cindie Ole DASEN, MD;  Location: [facility] INVASIVE CV LAB;  Service: Cardiovascular;  Laterality: N/A;   ROTATOR CUFF REPAIR     bilateral   TOOTH EXTRACTION N/A 04/03/2020   Procedure: DENTAL RESTORATION/EXTRACTIONS;  Surgeon: Sheryle Hamilton, DDS;  Location: [facility] OR;  Service: Oral Surgery;  Laterality: N/A;   TOTAL HIP ARTHROPLASTY  1987   right. Dr. Carlos   TRANSESOPHAGEAL ECHOCARDIOGRAM (CATH LAB) N/A 12/04/2023   Procedure: TRANSESOPHAGEAL ECHOCARDIOGRAM;  Surgeon: Barbaraann Darryle Ned, MD;  Location: [facility] INVASIVE CV LAB;  Service: Cardiovascular;  Laterality: N/A;   TUBAL LIGATION       Scheduled Meds:  allopurinol   100 mg Oral Daily   apixaban   5 mg Oral BID   colchicine   0.6 mg Oral Daily   ezetimibe   10 mg Oral Daily   furosemide   80 mg Intravenous Q12H   gabapentin   300 mg Oral QHS   potassium chloride   40 mEq Oral Q2H   Continuous Infusions:  cefTRIAXone  (ROCEPHIN )  IV Stopped (10/23/24 0344)   PRN Meds: oxyCODONE   Allergies:   Allergies[1]  Social History:   Social History   Socioeconomic History   Marital status: Widowed    Spouse name: Not on file   Number of children: 3   Years of education: Not on file   Highest education level: High school graduate  Occupational History   Occupation: retired  Tobacco Use   Smoking status: Former    Current packs/day: 0.00    Average packs/day: 0.5 packs/day for 55.0 years (27.5 ttl pk-yrs)    Types: Cigarettes    Start date: 88    Quit date: 2017    Years since quitting: 8.9    Passive exposure: Past   Smokeless tobacco: Never   Tobacco comments:    Former smoker 01/22/24  Vaping Use   Vaping status: Never Used  Substance and Sexual Activity   Alcohol use: Not Currently   Drug use: No   Sexual activity: Not on file  Other Topics Concern   Not on file  Social History Narrative   Formerly daycare provider. Lives with husband,  daughter Porter and grandkids.   Social Drivers of Health   Tobacco Use: Medium Risk (09/10/2024)   Patient History    Smoking Tobacco Use: Former    Smokeless Tobacco Use: Never    Passive Exposure: Past  Physicist, Medical Strain: Low Risk (04/29/2024)   Overall Financial Resource Strain (CARDIA)    Difficulty of Paying Living Expenses: Not hard at all  Food Insecurity: No Food Insecurity (08/30/2024)   Epic    Worried About Programme Researcher, Broadcasting/film/video in the Last Year: Never true    Ran Out of Food in the Last Year: Never true  Transportation Needs: No Transportation Needs (08/30/2024)   Epic    Lack of Transportation (Medical):  No    Lack of Transportation (Non-Medical): No  Physical Activity: Insufficiently Active (04/29/2024)   Exercise Vital Sign    Days of Exercise per Week: 3 days    Minutes of Exercise per Session: 30 min  Stress: No Stress Concern Present (04/29/2024)   [employer] of Occupational Health - Occupational Stress Questionnaire    Feeling of Stress: Only a little  Social Connections: Moderately Isolated (08/30/2024)   Social Connection and Isolation Panel    Frequency of Communication with Friends and Family: More than three times a week    Frequency of Social Gatherings with Friends and Family: Three times a week    Attends Religious Services: More than 4 times per year    Active Member of Clubs or Organizations: No    Attends Banker Meetings: Never    Marital Status: Widowed  Intimate Partner Violence: Not At Risk (08/30/2024)   Epic    Fear of Current or Ex-Partner: No    Emotionally Abused: No    Physically Abused: No    Sexually Abused: No  Depression (PHQ2-9): High Risk (09/10/2024)   Depression (PHQ2-9)    PHQ-2 Score: 20  Alcohol Screen: Low Risk (04/29/2024)   Alcohol Screen    Last Alcohol Screening Score (AUDIT): 0  Housing: Low Risk (08/30/2024)   Epic    Unable to Pay for Housing in the Last Year: No    Number of Times Moved  in the Last Year: 0    Homeless in the Last Year: No  Utilities: Not At Risk (08/30/2024)   Epic    Threatened with loss of utilities: No  Health Literacy: Adequate Health Literacy (06/19/2024)   B1300 Health Literacy    Frequency of need for help with medical instructions: Rarely    Family History:    Family History  Problem Relation Age of Onset   Depression Mother    Diabetes Mother    Hypertension Mother    Stroke Mother    Heart disease Mother    Cancer Father        unknown primary   Diabetes Sister    Stomach cancer Brother    Throat cancer Son    Breast cancer Neg Hx    Esophageal cancer Neg Hx    Colon cancer Neg Hx    Colon polyps Neg Hx      ROS:  Review of Systems  Constitutional: Positive for weight gain.  Cardiovascular:  Positive for dyspnea on exertion, leg swelling, orthopnea and paroxysmal nocturnal dyspnea.  Respiratory:  Positive for shortness of breath.   Gastrointestinal:  Positive for hematochezia.  Genitourinary:  Positive for hematuria.     Physical Exam/Data: Vitals:   10/23/24 0815 10/23/24 0915 10/23/24 0930 10/23/24 0945  BP: 100/65 98/60 107/84 104/61  Pulse: 67 75 (!) 38 76  Resp: (!) 22 10 10 14   Temp:      TempSrc:      SpO2: 100% 93% 100% 96%  Weight:      Height:        Intake/Output Summary (Last 24 hours) at 10/23/2024 1011 Last data filed at 10/23/2024 0723 Gross per 24 hour  Intake --  Output 600 ml  Net -600 ml      10/22/2024    5:57 PM 09/10/2024    2:28 PM 09/06/2024    3:00 PM  Last 3 Weights  Weight (lbs) 160 lb 146 lb 6.4 oz 146 lb 9.6 oz  Weight (kg) 72.576  kg 66.407 kg 66.497 kg     Body mass index is 25.82 kg/m.  General: Resting in bed in no acute distress, on room air Neck: Unable to assess JVD due to positioning/movement limited by pt pain Vascular: Distal pulses 2+ bilaterally Cardiac: Distant heart sounds Lungs: Clear to auscultation bilaterally Abd: Soft, nontender Ext: 3+ pitting BLE  edema Musculoskeletal: No deformities Skin: Warm and dry  Psych: Normal affect   EKG:  The EKG was personally reviewed and demonstrates:  Biventricular paced rhythm, rate in 60s-70s Telemetry:  Telemetry was personally reviewed and demonstrates same as above   Relevant CV Studies  Echo (08/30/2024): LVEF 16% with global HK, severely dilated LV, RV mildly enlarged with severely reduced systolic function, RVSP 59.9 mmHg, severe BAE, severe TR, dilated IVC  CXR this admission (10/22/24): mild cardiomegaly with mild central vascular congestion, no pleural effusion   Laboratory Data: High Sensitivity Troponin:  No results for input(s): TROPONINIHS in the last 720 hours.  Recent Labs  Lab 10/22/24 2030 10/22/24 2136  TRNPT 18 17      Chemistry Recent Labs  Lab 10/22/24 2030 10/23/24 0604  NA 136 139  K 3.4* 3.1*  CL 97* 100  CO2 26 30  GLUCOSE 84 100*  BUN 15 16  CREATININE 0.74 0.81  CALCIUM  8.8* 8.7*  MG  --  1.6*  GFRNONAA >60 >60  ANIONGAP 13 9    Recent Labs  Lab 10/22/24 2030  PROT 7.3  ALBUMIN 3.7  AST 35  ALT 8  ALKPHOS 112  BILITOT 1.0   Lipids No results for input(s): CHOL, TRIG, HDL, LABVLDL, LDLCALC, CHOLHDL in the last 168 hours.  Hematology Recent Labs  Lab 10/22/24 1900  WBC 4.7  RBC 4.50  HGB 12.9  HCT 40.7  MCV 90.4  MCH 28.7  MCHC 31.7  RDW 17.5*  PLT 251   Thyroid  No results for input(s): TSH, FREET4 in the last 168 hours.  BNP Recent Labs  Lab 10/22/24 2030  PROBNP 6,767.0*    DDimer No results for input(s): DDIMER in the last 168 hours.  Radiology/Studies:  DG Chest Portable 1 View Result Date: 10/22/2024 CLINICAL DATA:  Shortness of breath. EXAM: PORTABLE CHEST 1 VIEW COMPARISON:  Chest radiograph dated 08/29/2024. FINDINGS: There is mild cardiomegaly with mild central vascular congestion. No focal consolidation, pleural effusion, or pneumothorax. Left pectoral pacemaker device. No acute osseous  pathology. IMPRESSION: Mild cardiomegaly with mild central vascular congestion. Electronically Signed   By: Vanetta Chou M.D.   On: 10/22/2024 20:26    Assessment and Plan:  Acute on chronic heart failure with reduced ejection fraction Nonischemic cardiomyopathy Presented with SOB and BLE edema unimproved on Lasix  80 mg in the morning and 40-60 mg at night x 4 weeks PTA proBNP 6,767 CXR this admission (10/22/24): mild cardiomegaly with mild central vascular congestion, no pleural effusion Echo (08/30/2024): LVEF 16% with global HK, severely dilated LV, RV mildly enlarged with severely reduced systolic function, RVSP 59.9 mmHg, severe BAE, severe TR, dilated IVC S/p CRT-P (01/2024) Last device check with normal device function showing biventricular paced rhythm (10/02/2024) Medication compliant but history of difficulty tolerating optimal GDMT Home meds: losartan  25 mg daily, metoprolol  tartrate 50 mg BID, spironolactone  12.5 mg daily, furosemide  40 mg BID, Lasix  80 mg in AM and 40 mg in PM daily Weight at discharge from 10/31-11/2 hospitalization 144.8 lbs, current weight 160.5 lbs BLE with 3+ pitting edema on exam Apply unna boots and TED hose Hold  BB due to acute CHF exacerbation Mildly hypotensive overnight but low/normotensive this AM, continue to hold losartan  and spironolactone  and monitor BP Continue diuresis with IV Lasix  80 mg IV BID Supplement with oral potassium Start midodrine  5 mg TID Continue to monitor strict I/Os, daily weights, daily BMPs  Permanent A-fib S/p CRT-P (01/2024) and AV nodal ablation (03/2024) ED EKG/telemetry showed biventricular pacing, rate in 60s-70s Continue home meds metoprolol  tartrate 50 mg BID and Eliquis  5 mg BID  Hypertension Hold BB given acute CHF exacerbation Hold home med losartan  given low/normal BP and ongoing IV diuresis  Hyperlipidemia Continue home Zetia   Per primary Suprapubic pain UTI BRBPR COPD Gout Chronic pain  syndrome   Risk Assessment/Risk Scores:       New [place]  Heart Association (NYHA) Functional Class NYHA Class III  CHA2DS2-VASc Score = 5   This indicates a 7.2% annual risk of stroke. The patient's score is based upon: CHF History: 1 HTN History: 1 Diabetes History: 0 Stroke History: 0 Vascular Disease History: 0 Age Score: 2 Gender Score: 1   For questions or updates, please contact  [facility] Please consult [email or web address] for contact info under   Signed, Owen MARLA Daniels, PA-C  10/23/2024   ADDENDUM:   Patient seen and examined with Hanh K Le PA-C.  I personally taken a history, examined the patient, reviewed relevant notes,  laboratory data / imaging studies.  I performed a substantive portion of this encounter and formulated the important aspects of the plan.  I agree with the APP's note, impression, and recommendations; however, I have edited the note to reflect changes or salient points.   Patient presents to the hospital due to shortness of breath and lower extremity swelling and weight gain. She endorses orthopnea, PND, lower extremity swelling. Her weight has clearly gone up since her last hospital discharge.  Cardiology consulted for heart failure management.  Patient denies anginal chest pain.  PHYSICAL EXAM: Today's Vitals   10/23/24 1145 10/23/24 1152 10/23/24 1242 10/23/24 1259  BP: 106/66   103/75  Pulse: (!) 52 72  67  Resp: 12 16  18   Temp:    98.2 F (36.8 C)  TempSrc:    Oral  SpO2: 97% 100%  95%  Weight:    72.8 kg  Height:    5' 6 (1.676 m)  PainSc:   0-No pain    Body mass index is 25.9 kg/m.   Net IO Since Admission: -600 mL [10/23/24 1455]  Filed Weights   10/22/24 1757 10/23/24 1259  Weight: 72.6 kg 72.8 kg    General: Appears older than stated age, hemodynamically stable, no acute distress HEENT: Positive JVP Lungs: Decreased breath sounds bilaterally, unable to sit forward for posterior auscultation, mild crackles  laterally Heart: Regular, positive S1-S2, holosystolic murmur heard at the left lower sternal border Abdomen: Protuberant, warm, positive bowel sounds in all 4 quadrants Extremities: 3+ pitting edema, warm to touch  EKG: (personally reviewed by me) Paced rhythm  Telemetry: (personally reviewed by me) Paced rhythm   Impression & Recommendations: :  Acute on chronic heart failure with reduced EF. Biventricular dysfunction Nonischemic cardiomyopathy Stage C, NYHA class III/IV Echo October 2025: LVEF <20%,RV systolic function severely reduced, RV size dilated Discontinue spironolactone . Continue IV Lasix  80 mg twice daily. Start midodrine  5 mg p.o. 3 times daily Focus on volume management and will slowly uptitrate GDMT as renal function and blood pressure allows Patient is warm and volume overloaded on physical examination AST ALT  within normal limits Strict I's and O's and daily weights Unna boots and bilateral thigh TED hoses Net IO Since Admission: -600 mL [10/23/24 1504] Last d/c weight 144#  Persistent atrial fibrillation status post AV nodal ablation with CRT P Continue anticoagulation. No AV nodal blocking agents given the acute decompensated heart failure and severely reduced LVEF Will interrogate the device.  Hyperlipidemia: Continue home dose Zetia  10 mg p.o. daily  Further recommendations to follow as the case evolves.   This note was created using a voice recognition software as a result there may be grammatical errors inadvertently enclosed that do not reflect the nature of this encounter. Every attempt is made to correct such errors.   Madonna Michele HAS, [facility]  A Division of [facility] [address], [place], [place] [address]  10/23/2024 2:55 PM       [1]  Allergies Allergen Reactions   Crab [Shellfish Allergy]     Itching to lips   Morphine And Codeine Itching and Swelling   Atorvastatin  Other (See Comments)     achiness   Rosuvastatin  Other (See Comments)    Muscle pain and achiness   "

## 2024-10-23 NOTE — Assessment & Plan Note (Signed)
 Moisture-associated skin damage, present on arrival. - Wound care per [facility]OC recommendations - Barrier cream and frequent repositioning every 2 hours

## 2024-10-23 NOTE — Assessment & Plan Note (Addendum)
 Gout: Continue home allopurinol  100 mg daily and colchicine  0.6 mg daily HLD: Continue home ezetimibe  10 mg daily A-fib: Rate controlled.  Continue home Eliquis  5 mg twice daily Chronic pain syndrome: Continue home oxycodone  5 mg every 6 hours as needed

## 2024-10-24 DIAGNOSIS — I5023 Acute on chronic systolic (congestive) heart failure: Secondary | ICD-10-CM | POA: Diagnosis not present

## 2024-10-24 LAB — CBC
HCT: 38.9 % (ref 36.0–46.0)
Hemoglobin: 12.4 g/dL (ref 12.0–15.0)
MCH: 28.6 pg (ref 26.0–34.0)
MCHC: 31.9 g/dL (ref 30.0–36.0)
MCV: 89.8 fL (ref 80.0–100.0)
Platelets: 191 K/uL (ref 150–400)
RBC: 4.33 MIL/uL (ref 3.87–5.11)
RDW: 17.3 % — ABNORMAL HIGH (ref 11.5–15.5)
WBC: 4.6 K/uL (ref 4.0–10.5)
nRBC: 0 % (ref 0.0–0.2)

## 2024-10-24 LAB — BASIC METABOLIC PANEL WITH GFR
Anion gap: 9 (ref 5–15)
BUN: 17 mg/dL (ref 8–23)
CO2: 30 mmol/L (ref 22–32)
Calcium: 8.8 mg/dL — ABNORMAL LOW (ref 8.9–10.3)
Chloride: 100 mmol/L (ref 98–111)
Creatinine, Ser: 0.95 mg/dL (ref 0.44–1.00)
GFR, Estimated: >60 mL/min
Glucose, Bld: 104 mg/dL — ABNORMAL HIGH (ref 70–99)
Potassium: 4.4 mmol/L (ref 3.5–5.1)
Sodium: 139 mmol/L (ref 135–145)

## 2024-10-24 LAB — MAGNESIUM: Magnesium: 2.1 mg/dL (ref 1.7–2.4)

## 2024-10-24 MED ORDER — DULOXETINE HCL 30 MG PO CPEP
30.0000 mg | ORAL_CAPSULE | Freq: Every day | ORAL | Status: DC
  Administered 2024-10-24 – 2024-10-28 (×5): 30 mg via ORAL
  Filled 2024-10-24 (×4): qty 1

## 2024-10-24 NOTE — Plan of Care (Signed)
  Problem: Education: Goal: Knowledge of General Education information will improve Description: Including pain rating scale, medication(s)/side effects and non-pharmacologic comfort measures Outcome: Progressing   Problem: Clinical Measurements: Goal: Diagnostic test results will improve Outcome: Progressing   Problem: Clinical Measurements: Goal: Cardiovascular complication will be avoided Outcome: Progressing   

## 2024-10-24 NOTE — Evaluation (Signed)
 Occupational Therapy Evaluation Patient Details Name: Donna Herrera MRN: [id number] DOB: 02/18/1946 Today's Date: 10/24/2024   History of Present Illness   78 y/o F presenting to ED on 12/23 with worsening BLE edema and dyspnea, BRBPR. Admitted for CHF exacerbation.     PMH includes HTN, HFrEF, A fib, gout, GERD, HLD, prediabetes, COPD, pulmonary fibrosis, bil RTC repair, R THA     Clinical Impressions Pt reports using RW for mobility and has assist from family for ADLs/IADLs. Pt currently with back pain, needs up to max A for ADLs and min A for transfers with RW. Pt mod-max a for bed mobility, needs bed elevated very high to attempt standing, also pt with posterior lean, difficulty sitting upright, and needs min A to prevent sliding forward off of bed. Pt presenting with impairments listed below, will follow acutely. Patient will benefit from continued inpatient follow up therapy, <3 hours/day.      If plan is discharge home, recommend the following:   A lot of help with walking and/or transfers;A lot of help with bathing/dressing/bathroom;Direct supervision/assist for medications management;Direct supervision/assist for financial management;Assist for transportation     Functional Status Assessment   Patient has had a recent decline in their functional status and demonstrates the ability to make significant improvements in function in a reasonable and predictable amount of time.     Equipment Recommendations   BSC/3in1;Tub/shower bench     Recommendations for Other Services   PT consult     Precautions/Restrictions   Precautions Precautions: Fall Restrictions Weight Bearing Restrictions Per Provider Order: No     Mobility Bed Mobility Overal bed mobility: Needs Assistance Bed Mobility: Supine to Sit, Sit to Supine     Supine to sit: Mod assist Sit to supine: Max assist   General bed mobility comments: mod A and use of bed pad to scoot hips to  EOB, max A to return to bed helping BLEs, pt wanting bed elevated high however, difficulty flexing hips and knees and leans posteriorly -- needs min A to prevent sliding forward off of bed    Transfers Overall transfer level: Needs assistance Equipment used: Rolling walker (2 wheels) Transfers: Sit to/from Stand Sit to Stand: Min assist                  Balance Overall balance assessment: Needs assistance Sitting-balance support: Feet supported Sitting balance-Leahy Scale: Fair   Postural control: Posterior lean Standing balance support: During functional activity, Reliant on assistive device for balance Standing balance-Leahy Scale: Poor                             ADL either performed or assessed with clinical judgement   ADL Overall ADL's : Needs assistance/impaired Eating/Feeding: Set up;Sitting   Grooming: Set up;Sitting   Upper Body Bathing: Moderate assistance   Lower Body Bathing: Maximal assistance   Upper Body Dressing : Moderate assistance   Lower Body Dressing: Maximal assistance   Toilet Transfer: Minimal assistance;Ambulation;BSC/3in1;Rolling walker (2 wheels)   Toileting- Clothing Manipulation and Hygiene: Maximal assistance       Functional mobility during ADLs: Minimal assistance;Rolling walker (2 wheels)       Vision   Vision Assessment?: No apparent visual deficits     Perception Perception: Not tested       Praxis Praxis: Not tested       Pertinent Vitals/Pain Pain Assessment Pain Assessment: Faces Pain Score: 4  Faces Pain Scale:  Hurts little more Pain Location: back Pain Descriptors / Indicators: Discomfort Pain Intervention(s): Limited activity within patient's tolerance, Monitored during session, Repositioned     Extremity/Trunk Assessment Upper Extremity Assessment Upper Extremity Assessment: Generalized weakness   Lower Extremity Assessment Lower Extremity Assessment: Defer to PT evaluation   Cervical  / Trunk Assessment Cervical / Trunk Assessment: Normal   Communication Communication Communication: No apparent difficulties   Cognition Arousal: Alert Behavior During Therapy: WFL for tasks assessed/performed Cognition: No family/caregiver present to determine baseline             OT - Cognition Comments: pt with decr safety awareness and overall awareness of deficits                 Following commands: Intact       Cueing  General Comments   Cueing Techniques: Verbal cues  VSS   Exercises     Shoulder Instructions      Home Living Family/patient expects to be discharged to:: Private residence Living Arrangements: Children Available Help at Discharge: Family;Available PRN/intermittently Type of Home: House Home Access: Ramped entrance     Home Layout: One level     Bathroom Shower/Tub: Walk-in shower         Home Equipment: Grab bars - tub/shower;Grab bars - toilet;Rolling Walker (2 wheels);Crutches          Prior Functioning/Environment Prior Level of Function : Needs assist             Mobility Comments: RW for mobility ADLs Comments: family assists with meds, ADLs,  trasnportation    OT Problem List: Decreased strength;Decreased range of motion;Decreased activity tolerance;Impaired balance (sitting and/or standing);Decreased safety awareness   OT Treatment/Interventions: Self-care/ADL training;Therapeutic exercise;Energy conservation;DME and/or AE instruction;Therapeutic activities;Patient/family education;Balance training      OT Goals(Current goals can be found in the care plan section)   Acute Rehab OT Goals Patient Stated Goal: none stated OT Goal Formulation: With patient Time For Goal Achievement: 11/07/24 Potential to Achieve Goals: Good   OT Frequency:  Min 2X/week    Co-evaluation              AM-PAC OT 6 Clicks Daily Activity     Outcome Measure Help from another person eating meals?: A Little Help from  another person taking care of personal grooming?: A Little Help from another person toileting, which includes using toliet, bedpan, or urinal?: A Lot Help from another person bathing (including washing, rinsing, drying)?: A Lot Help from another person to put on and taking off regular upper body clothing?: A Little Help from another person to put on and taking off regular lower body clothing?: A Lot 6 Click Score: 15   End of Session Equipment Utilized During Treatment: Rolling walker (2 wheels);Gait belt Nurse Communication: Mobility status  Activity Tolerance: Patient tolerated treatment well Patient left: in bed;with call bell/phone within reach;with bed alarm set  OT Visit Diagnosis: Unsteadiness on feet (R26.81);Other abnormalities of gait and mobility (R26.89);Muscle weakness (generalized) (M62.81)                Time: 9255-9187 OT Time Calculation (min): 28 min Charges:  OT General Charges $OT Visit: 1 Visit OT Evaluation $OT Eval Moderate Complexity: 1 Mod OT Treatments $Self Care/Home Management : 8-22 mins  Creola Krotz K, OTD, OTR/L SecureChat Preferred Acute Rehab [phone]   Laneta POUR Koonce 10/24/2024, 8:50 AM

## 2024-10-24 NOTE — Assessment & Plan Note (Signed)
 Noted yesterday after episode of chest pain.  Did not appear prolonged on bedside telemetry, will obtain repeat EKG.  Continue to optimize electrolytes as needed. - Repeat EKG

## 2024-10-24 NOTE — Progress Notes (Signed)
 Orthopedic Tech Progress Note Patient Details:  Sandeep Delagarza Feb 16, 1946 [id number]  Ortho Devices Type of Ortho Device: Radio broadcast assistant Ortho Device/Splint Location: n/a Ortho Device/Splint Interventions: Ordered   Post Interventions Patient Tolerated: Refused intervention (pt educated on benefits of unna boots x2, explained ortho techs can return at anytime should she wish to have them applied)  Tinnie Ronal Brasil 10/24/2024, 4:04 PM

## 2024-10-24 NOTE — Progress Notes (Signed)
"  ° °  Rounding Note    Patient Name: Donna Herrera Date of Encounter: 10/24/2024  [facility] Cardiologist: Vina Gull, MD   Subjective   NAEO. Feeling better than she did on arrival.  Vital Signs    Vitals:   10/23/24 1612 10/23/24 1955 10/24/24 0003 10/24/24 0405  BP: 109/74 116/72 (!) 128/96 (!) 100/59  Pulse:  70 62 70  Resp: 13 15 16 17   Temp:  98.4 F (36.9 C) 98.7 F (37.1 C) 98.7 F (37.1 C)  TempSrc:  Oral Oral Oral  SpO2:  100% 100% 100%  Weight:    70.2 kg  Height:        Intake/Output Summary (Last 24 hours) at 10/24/2024 0705 Last data filed at 10/24/2024 0406 Gross per 24 hour  Intake 240 ml  Output 1850 ml  Net -1610 ml      10/24/2024    4:05 AM 10/23/2024   12:59 PM 10/22/2024    5:57 PM  Last 3 Weights  Weight (lbs) 154 lb 12.2 oz 160 lb 7.9 oz 160 lb  Weight (kg) 70.2 kg 72.8 kg 72.576 kg      Telemetry    Paced. PVCs. NSVT - Personally Reviewed  ECG    Personally Reviewed  Physical Exam   GEN: No acute distress.   Cardiac: RRR, no murmurs, rubs, or gallops. 2+ pitting bilateral LE edema. Respiratory: Clear to auscultation bilaterally. Psych: Normal affect   Assessment & Plan    #Chronic systolic heart failure #NICM NYHA III-IV. Warm and volume overloaded on exam. EF<20%. Volume status is improving. Continue IV Lasix  80mg  BID Continue midodrine  5mg  PO TID Keep K>4, Mg>2  #Permanent AF #s/p AV nodal Ablation CRT functioning appropriately. LV preexcited by 80ms. Continue anticoagulation  #s/p CRT-P Device funcitoning appropriately.  #UTI Management per primary team    Ole T. Cindie, MD, [facility] Cardiac Electrophysiology  "

## 2024-10-25 ENCOUNTER — Other Ambulatory Visit (HOSPITAL_COMMUNITY): Payer: Self-pay

## 2024-10-25 ENCOUNTER — Other Ambulatory Visit: Payer: Self-pay

## 2024-10-25 ENCOUNTER — Encounter (HOSPITAL_COMMUNITY): Payer: Self-pay | Admitting: Family Medicine

## 2024-10-25 DIAGNOSIS — I5023 Acute on chronic systolic (congestive) heart failure: Secondary | ICD-10-CM | POA: Diagnosis not present

## 2024-10-25 DIAGNOSIS — I4819 Other persistent atrial fibrillation: Secondary | ICD-10-CM | POA: Diagnosis not present

## 2024-10-25 DIAGNOSIS — I10 Essential (primary) hypertension: Secondary | ICD-10-CM | POA: Diagnosis not present

## 2024-10-25 DIAGNOSIS — I428 Other cardiomyopathies: Secondary | ICD-10-CM | POA: Diagnosis not present

## 2024-10-25 DIAGNOSIS — N12 Tubulo-interstitial nephritis, not specified as acute or chronic: Secondary | ICD-10-CM

## 2024-10-25 DIAGNOSIS — Z950 Presence of cardiac pacemaker: Secondary | ICD-10-CM | POA: Diagnosis not present

## 2024-10-25 DIAGNOSIS — I5082 Biventricular heart failure: Secondary | ICD-10-CM | POA: Diagnosis not present

## 2024-10-25 DIAGNOSIS — E785 Hyperlipidemia, unspecified: Secondary | ICD-10-CM | POA: Diagnosis not present

## 2024-10-25 LAB — CBC
HCT: 39.7 % (ref 36.0–46.0)
Hemoglobin: 12.4 g/dL (ref 12.0–15.0)
MCH: 28.2 pg (ref 26.0–34.0)
MCHC: 31.2 g/dL (ref 30.0–36.0)
MCV: 90.4 fL (ref 80.0–100.0)
Platelets: 204 K/uL (ref 150–400)
RBC: 4.39 MIL/uL (ref 3.87–5.11)
RDW: 17.3 % — ABNORMAL HIGH (ref 11.5–15.5)
WBC: 4.8 K/uL (ref 4.0–10.5)
nRBC: 0 % (ref 0.0–0.2)

## 2024-10-25 LAB — BASIC METABOLIC PANEL WITH GFR
Anion gap: 9 (ref 5–15)
BUN: 16 mg/dL (ref 8–23)
CO2: 31 mmol/L (ref 22–32)
Calcium: 9 mg/dL (ref 8.9–10.3)
Chloride: 97 mmol/L — ABNORMAL LOW (ref 98–111)
Creatinine, Ser: 0.89 mg/dL (ref 0.44–1.00)
GFR, Estimated: >60 mL/min
Glucose, Bld: 106 mg/dL — ABNORMAL HIGH (ref 70–99)
Potassium: 4.1 mmol/L (ref 3.5–5.1)
Sodium: 137 mmol/L (ref 135–145)

## 2024-10-25 LAB — URINE CULTURE: Culture: >=100000 — AB

## 2024-10-25 LAB — MAGNESIUM: Magnesium: 2.1 mg/dL (ref 1.7–2.4)

## 2024-10-25 MED ORDER — FUROSEMIDE 10 MG/ML IJ SOLN
40.0000 mg | Freq: Two times a day (BID) | INTRAMUSCULAR | Status: DC
  Administered 2024-10-25 – 2024-10-27 (×4): 40 mg via INTRAVENOUS
  Filled 2024-10-25 (×4): qty 4

## 2024-10-25 MED ORDER — CEFADROXIL 500 MG PO CAPS
1000.0000 mg | ORAL_CAPSULE | Freq: Two times a day (BID) | ORAL | Status: DC
  Administered 2024-10-26 – 2024-10-28 (×5): 1000 mg via ORAL
  Filled 2024-10-25 (×4): qty 2

## 2024-10-25 NOTE — Assessment & Plan Note (Signed)
 EP physician saw patient yesterday -- Continue home Eliquis  5 mg twice daily -- Pacemaker functioning properly

## 2024-10-25 NOTE — TOC CM/SW Note (Addendum)
 Transition of Care [facility]) - Inpatient Brief Assessment   Patient Details  Name: Donna Herrera MRN: [id number] Date of Birth: 10-Dec-1945  Transition of Care [facility]) CM/SW Contact:    Waddell Barnie Rama, RN Phone Number: 10/25/2024, 3:17 PM   Clinical Narrative: From home with daughter, has PCP and insurance on file, states has no HH services in place at this time , has walker and a bsc at home.  States care giver will transport them home at dc and family is support system, states gets medications from [facility] on [address]  Pta self ambulatory with walker.  Per PT eval rec HHPT and tub shower bench,  NCM offered choice, she has no preference.  NCM sent referral to [facility] and [facility].  [facility] has accepted.  Soc will begin 24 to 48 hrs post dc.  For HHPT, and HHAIDE, will need orders.  Also patient will purchase a tub shower bench from [employer].   Transition of Care Asessment: Insurance and Status: Insurance coverage has been reviewed Patient has primary care physician: Yes Home environment has been reviewed: home with daughter Prior level of function:: ambulatory with walker Prior/Current Home Services: Current home services (walker, bsc) Social Drivers of Health Review: SDOH reviewed no interventions necessary Readmission risk has been reviewed: Yes Transition of care needs: transition of care needs identified, TOC will continue to follow

## 2024-10-25 NOTE — Assessment & Plan Note (Signed)
 Gout: Continue home allopurinol  100 mg daily and colchicine  0.6 mg daily HLD: Continue home ezetimibe  10 mg daily Chronic pain syndrome: Continue home oxycodone  5 mg every 6 hours as needed Sacral pressure ulcer: wound care and frequent repositioning Neuropathy: Home gabapentin  300 mg nightly, home duloxetine  30 mg daily

## 2024-10-25 NOTE — Plan of Care (Signed)
   Problem: Education: Goal: Knowledge of General Education information will improve Description: Including pain rating scale, medication(s)/side effects and non-pharmacologic comfort measures Outcome: Progressing   Problem: Clinical Measurements: Goal: Will remain free from infection Outcome: Progressing   Problem: Clinical Measurements: Goal: Diagnostic test results will improve Outcome: Progressing

## 2024-10-25 NOTE — Assessment & Plan Note (Signed)
 Noted after episode of chest pain on 12/24.  EKG 12/25 with slight improvement. --Avoid QT prolonging agents as able --Continue to monitor with telemetry

## 2024-10-25 NOTE — Care Management Important Message (Signed)
 Important Message  Patient Details  Name: Donna Herrera MRN: [id number] Date of Birth: 1946-09-27   Important Message Given:  Yes - Medicare IM     Claretta Deed 10/25/2024, 2:56 PM

## 2024-10-25 NOTE — Assessment & Plan Note (Signed)
 Symptoms improved and stable.  Initial urine culture with greater than 100,000 E. coli colonies --Continue ceftriaxone  2 g daily --Follow-up susceptibilities to narrow ABX

## 2024-10-25 NOTE — Assessment & Plan Note (Signed)
 Down 5 L since admission with most recent weight 157.4 (dry weight around 144.8).  Cardiology added midodrine  yesterday due to soft blood pressures throughout admission, pressure stable overnight.  Will continue to diurese per cardiology recommendations. --Cardiology recommendations 12/24  --Lasix  80 mg IV BID - Recommend Unna boots and TED hose, however patient has been refusing this -- Holding beta-blocker  -- Holding losartan , spironolactone  -- Midodrine  5 mg 3 times daily -- Strict I's/O's - Daily weights  -- Daily BMP and mag, goal K>4, Mg >2 -- Continuous cardiac monitoring

## 2024-10-25 NOTE — Progress Notes (Signed)
 "    Daily Progress Note Intern Pager: [phone]  Patient name: Donna Herrera Medical record number: [id number] Date of birth: 01-Aug-1946 Age: 78 y.o. Gender: female  Primary Care Provider: Lonnie Earnest, MD Consultants: Cardiology Code Status: DNR/DNI  Pt Overview and Major Events to Date:  12/23 - Admitted to FMTS in HF exacerbation 12/24 - Consult Cardiology  Medical Decision Making: [name] is a 78 year old female with history of HFrEF (EF less than 20%), hypertension, permanent A-fib, gout, GERD, HLD, COPD, prediabetes and pulmonary fibrosis presented with dyspnea secondary to heart failure exacerbation.  Improved overall and continuing to diurese.  Assessment & Plan Acute exacerbation of CHF (congestive heart failure) (HCC) HFrEF (heart failure with reduced ejection fraction) (HCC) Down 5 L since admission with most recent weight 157.4 (dry weight around 144.8).  Cardiology added midodrine  yesterday due to soft blood pressures throughout admission, pressure stable overnight.  Will continue to diurese per cardiology recommendations. --Cardiology recommendations 12/24  --Lasix  80 mg IV BID - Recommend Unna boots and TED hose, however patient has been refusing this -- Holding beta-blocker  -- Holding losartan , spironolactone  -- Midodrine  5 mg 3 times daily -- Strict I's/O's - Daily weights  -- Daily BMP and mag, goal K>4, Mg >2 -- Continuous cardiac monitoring QT prolongation Noted after episode of chest pain on 12/24.  EKG 12/25 with slight improvement. --Avoid QT prolonging agents as able --Continue to monitor with telemetry Persistent atrial fibrillation [name]) Cardiac pacemaker in situ EP physician saw patient yesterday -- Continue home Eliquis  5 mg twice daily -- Pacemaker functioning properly Pyelonephritis Symptoms improved and stable.  Initial urine culture with greater than 100,000 E. coli colonies --Continue ceftriaxone  2 g  daily --Follow-up susceptibilities to narrow ABX BRBPR (bright red blood per rectum) Bedside RN reported streak of blood in large, formed bowel movement overnight.  No signs or symptoms of active bleeding at the time and patient denies pain, lightheadedness.  AM CBC with stable hemoglobin.  Most likely due to known internal hemorrhoid -- Continue to monitor clinically. Chronic health problem Gout: Continue home allopurinol  100 mg daily and colchicine  0.6 mg daily HLD: Continue home ezetimibe  10 mg daily Chronic pain syndrome: Continue home oxycodone  5 mg every 6 hours as needed Sacral pressure ulcer: wound care and frequent repositioning Neuropathy: Home gabapentin  300 mg nightly, home duloxetine  30 mg daily   FEN/GI: Heart healthy PPx: On home Eliquis  Dispo: Pending ongoing diuresis and clinical improvement  Subjective:  Patient states she feels much better, states her breathing feels great and her legs are less swollen and painful.  Reports some neck pain that is a chronic problem for her.   Objective: Temp:  [98.3 F (36.8 C)-98.6 F (37 C)] 98.4 F (36.9 C) (12/26 0315) Pulse Rate:  [72-114] 72 (12/26 0315) Resp:  [11-18] 16 (12/26 0019) BP: (100-116)/(50-71) 116/61 (12/26 0315) SpO2:  [93 %-98 %] 97 % (12/26 0315) Weight:  [71.4 kg] 71.4 kg (12/26 0315) Physical Exam: General: Resting comfortably in bed, awakens to voice and is alert and conversant Cardiovascular: RRR, normal S1/S2, no M/R/G Respiratory: CTAB normal WOB on RA, no W/R/R Abdomen: Normoactive bowel sounds, soft, nontender, nondistended Extremities: Minimal pitting edema to BLE  Laboratory: Most recent CBC Lab Results  Component Value Date   WBC 4.8 10/25/2024   HGB 12.4 10/25/2024   HCT 39.7 10/25/2024   MCV 90.4 10/25/2024   PLT 204 10/25/2024   Most recent BMP    Latest Ref Rng & Units 10/25/2024  3:50 AM  BMP  Glucose 70 - 99 mg/dL 893   BUN 8 - 23 mg/dL 16   Creatinine 9.55 - 1.00 mg/dL  9.10   Sodium 864 - 854 mmol/L 137   Potassium 3.5 - 5.1 mmol/L 4.1   Chloride 98 - 111 mmol/L 97   CO2 22 - 32 mmol/L 31   Calcium  8.9 - 10.3 mg/dL 9.0     Robbye Dede, Rea, MD 10/25/2024, 4:59 AM  PGY-2, [facility] FPTS Intern pager: [phone], text pages welcome Secure chat group [facility] Teaching Service   "

## 2024-10-25 NOTE — Progress Notes (Addendum)
 "  Progress Note  Patient Name: Donna Herrera Date of Encounter: 10/25/2024 [facility] Cardiologist: Vina Gull, MD   Interval Summary   Doing well and resting comfortably in bed. States that she has had good urine output and significant improvement of swelling in her legs since starting on IV Lasix . Patient refuses unna boots and TED hose, stating she does not like them. SOB has improved. Denies any chest pain  Vital Signs Vitals:   10/25/24 0900 10/25/24 1100 10/25/24 1120 10/25/24 1527  BP:   116/71 (!) 48/31  Pulse:   69   Resp: 14 13 12 17   Temp:    97.6 F (36.4 C)  TempSrc:    Oral  SpO2:   100%   Weight:      Height:        Intake/Output Summary (Last 24 hours) at 10/25/2024 1538 Last data filed at 10/25/2024 1300 Gross per 24 hour  Intake 700 ml  Output 4700 ml  Net -4000 ml   Net IO Since Admission: -6,460 mL [10/25/24 1538]     10/25/2024    3:15 AM 10/24/2024    4:05 AM 10/23/2024   12:59 PM  Last 3 Weights  Weight (lbs) 157 lb 6.5 oz 154 lb 12.2 oz 160 lb 7.9 oz  Weight (kg) 71.4 kg 70.2 kg 72.8 kg      Telemetry/ECG  Biventricular paced rhythm with occasional PVCs - Personally Reviewed  Physical Exam  GEN: No acute distress.   Neck: No JVD Cardiac: RRR with ectopic beats, no murmurs, rubs, or gallops. Respiratory: Unable to sit forward for full posterior auscultation but apices clear to auscultation with good air movement bilaterally GI: Soft, nontender MS: 1+ pitting BLE edema, warm to touch  Assessment & Plan  Acute on chronic heart failure with reduced ejection fraction Nonischemic cardiomyopathy Presented with SOB and BLE edema unimproved on Lasix  80 mg in the AM and 40-60 mg in PM x 4 weeks PTA proBNP 6,767 on admission CXR this admission (10/22/24): mild cardiomegaly with mild central vascular congestion, no pleural effusion Echo (08/30/24): LVEF 16% with global HK, severely dilated LV, RV mildly enlarged with  severely reduced systolic function, TVSP 59.9 mmHg, severe BAE, severe TR, dilated IVC S/p CRT-P (01/2024) Device rep confirmed interrogation yesterday (10/24/24) with normal device function Medication compliant but history of difficulty tolerating optimal GDMT Home meds: losartan  25 mg daily, metoprolol  tartrate 50 mg BID, spironolactone  12.5 mg daily, furosemide  40 mg BID, Lasix  80 mg in AM and 40 mg in PM daily Weight at discharge from 10/31-11/2 hospitalization 144.8 lbs; since admission 160 lb 7.9 oz >> 157 lb 6.5 oz BLE edema improving, although pt refuses Unna boots/TED hose Net I/O -5,020 mL [10/25/24 1028] with -3,410 mL in past 24 hours Continue Lasix  IV 80 mg BID Continue strict I/Os, daily weights, and daily BMPs Soft BP tolerating diuresis, continue with midodrine  5 mg TID  Persistent atrial fibrillation S/p CRT-p (01/2024) and AV nodal ablation (03/2024) Device rep confirmed interrogation yesterday (10/24/24) with normal device function Continue anticoagulation  Hypertension Soft BP tolerating diuresis Hold home antihypertensives Continue midodrine  5 mg TID  Hyperlipidemia Continue home Zetia   Per primary Pyelonephritis  BRBPR Gout Chronic pain syndrome Sacral pressure ulcer Neuropathy    For questions or updates, please contact [facility] Please consult [email or web address] for contact info under   Signed, Owen MARLA Daniels, PA-C   ADDENDUM:   Patient seen and examined with Hanh Le PA-C.  I personally taken a history, examined the patient, reviewed relevant notes,  laboratory data / imaging studies.  I performed a substantive portion of this encounter and formulated the important aspects of the plan.  I agree with the APP's note, impression, and recommendations; however, I have edited the note to reflect changes or salient points.   Patient seen and examined at bedside. Denies anginal chest pain. Shortness of breath and swelling improving. Refuses to wear  compressions to help mobilize fluids.  PHYSICAL EXAM: Today's Vitals   10/25/24 1118 10/25/24 1120 10/25/24 1200 10/25/24 1527  BP:  116/71  (!) 48/31  Pulse:  69    Resp:  12  17  Temp:    97.6 F (36.4 C)  TempSrc:    Oral  SpO2:  100%    Weight:      Height:      PainSc: 0-No pain  0-No pain    Body mass index is 25.41 kg/m.   Net IO Since Admission: -6,460 mL [10/25/24 1538]  Filed Weights   10/23/24 1259 10/24/24 0405 10/25/24 0315  Weight: 72.8 kg 70.2 kg 71.4 kg    General: Appears older than stated age, hemodynamically stable, no acute distress HEENT: Positive JVP Lungs: Decreased breath sounds bilaterally, unable to sit forward for posterior auscultation, no crackles Heart: Regular, positive S1-S2, holosystolic murmur heard at the left lower sternal border Abdomen: Protuberant, warm, positive bowel sounds in all 4 quadrants Extremities: 1+ pitting edema, warm to touch  EKG: (personally reviewed by me) 10/25/2024: Ventricular paced rhythm  Telemetry: (personally reviewed by me) Paced rhythm   Impression & Recommendations: :  Acute on chronic heart failure with reduced EF. Biventricular dysfunction Nonischemic cardiomyopathy Stage C, NYHA class III Echo October 2025: LVEF <20%,RV systolic function severely reduced, RV size dilated Will decrease IV Lasix  80 mg twice daily to IV Lasix  40 mg p.o. twice daily with holding parameters.   Continue midodrine  5 mg p.o. 3 times daily Focus on volume management and will slowly uptitrate GDMT as renal function and blood pressure allows Patient is warm and volume overloaded on physical examination AST ALT within normal limits Strict I's and O's and daily weights Unna boots and bilateral thigh TED hoses, compressions refused Net IO Since Admission: -6,460 mL [10/25/24 1538] Last d/c weight 144# Home meds: losartan  25 mg daily, metoprolol  tartrate 50 mg BID, spironolactone  12.5 mg daily, furosemide  40 mg BID, Lasix  80 mg  in AM and 40 mg in PM daily   Persistent atrial fibrillation status post AV nodal ablation with CRT P Continue Eliquis  5 mg p.o. twice daily. No AV nodal blocking agents given the acute decompensated heart failure and severely reduced LVEF Device interrogated, normal function   Hyperlipidemia: Continue home dose Zetia  10 mg p.o. daily  Further recommendations to follow as the case evolves.   This note was created using a voice recognition software as a result there may be grammatical errors inadvertently enclosed that do not reflect the nature of this encounter. Every attempt is made to correct such errors.   Madonna Michele HAS, [facility]  A Division of [facility] [address] [place], [place] [address]  Pager: [phone] Office: [phone] 10/25/2024 3:38 PM    "

## 2024-10-25 NOTE — Progress Notes (Signed)
 Heart Failure Nurse Navigator Progress Note  PCP: Lonnie Earnest, MD PCP-Cardiologist: Okey Admission Diagnosis: None Admitted from: Family Practice via [facility]  Presentation:   Donna Herrera presented with shortness of breath and bilateral lower extremity edema from her family practice office. Per notes patient reports that her BLE edema has been worsening since her discharge from the hospital a month ago, despite taking 80 mg Lasix  in the am and 40-60 mg at night daily, she has seen little urine output and no improvement with her SOB or edema. Pro BNP 6,767. Troponin 17-18, CXR showed mild cardiomegaly with mild central vascular congestion, no pleural effusion. BP 100/65, HR 67, Patient refuses unna boots and TED hose, stating she does not like them.   Patient was re-educated on the sign and symptoms of heart failure, daily weights, states she has only been weighing herself maybe once a week. Continued education on diet/ fluid restrictions, patient reports to drinking up to 3-4 bottles of water and some Mt. Dew daily, as well as some salty foods, states  she has cut back on her salt intake recently.. Educated on the importance of taking all her medications as prescribed and attending all medical appointments.. She stated that either her daughter or Granddaughter make up her pill box every week and also take her to all appointments. Patient verbalized her understanding of all education, a HF TOC follow up appointment was scheduled for 11/04/2024 @ 1:30 pm.   ECHO/ LVEF: < 20 %   Clinical Course:  Past Medical History:  Diagnosis Date   Acute exacerbation of CHF (congestive heart failure) (HCC) 02/09/2024   Adenomatous polyp 03/23/2020   Colonoscopy 2021 notable for adenomatous polyp. Per GI, no further follow up colonoscopies recommended given patient's age.   Allergy    Arthritis    Atherosclerosis of aorta 07/15/2021   Candidiasis, mouth 10/19/2017   Chronic combined systolic  and diastolic CHF (congestive heart failure) (HCC) 10/21/2015   a. 12/2014 Echo: EF 40%; b. 10/2015 Echo: EF 25-40%; c. 02/2018 Echo: EF 15-20%; d. 01/2023 Echo: EF 50-55%; e. 12/2023 Echo: EF 20-25%, glob HK, RVSP , mod BAE, triv MR, mod TR.   Chronic left shoulder pain 04/10/2013   Chronic lung disease    Chronic pain    Chronic pain syndrome 07/20/2017   Confusion 06/08/2021   Depression 08/25/2016   DJD (degenerative joint disease) 10/12/2012   Multiple joint replacements by Dr. Carlos.    Dyshidrotic eczema 12/31/2014   Edentulous 07/19/2021   Emphysema lung (HCC) 03/02/2021   Long smoking history.  Noted on CT Low Dose Chest: 01/2021: IMPRESSION: Chronic interstitial changes raise the question of pulmonary fibrosis. High-resolution chest CT may prove helpful to further evaluate as clinically warranted.Emphysema (ICD10-J43.9) and Aortic Atherosclerosis (ICD10-170.0)  Recommended pulmonology referral but patient declined. Recommend continued discussion and symptom monitor   Essential hypertension 07/20/2017   GERD (gastroesophageal reflux disease)    Gout    H/O slipped capital femoral epiphysis (SCFE)    Heart failure, left, with LVEF 41-49% (HCC) 10/21/2015   transthoracic echocardiogram 06/06/18: EF appears improved to 40-45% with diffuse hypokinesis  A. Echo 3/16: EF 40%, diffuse HK, mild MR, moderate LAE, mild RAE, PASP 42 mmHg  //  B. Echo 12/16: Mild LVH, EF 35-40%, diffuse HK, grade 2 diastolic dysfunction, MAC, mild MR, moderate LAE, normal RVSF, PASP 49 mmHg   History of total right hip replacement 04/02/2019   Followed closely with orthopedist Dr. Ernie. Patient is s/p total right  hip replacement in 1980's with evidence of bone loss and osteopenia making reconstruction unlikely. Plan for conservative treatment at this time with crutches for ambulation and pain control.   Hyperlipidemia    Hypertension    Hypertensive heart disease with CHF (congestive heart failure) (HCC)  10/12/2012   Loss of taste 02/06/2018   Mass of breast, left 07/21/2014   NICM (nonischemic cardiomyopathy) (HCC) 06/09/2016   a. 04/2016 Cath: Normal coronary arteries, EF 30-35%, LVEDP 14 mmHg; b. 12/2014 Echo: EF 40%; c. 10/2015 Echo: EF 25-40%; d. 02/2018 Echo: EF 15-20%; e. 01/2023 Echo: EF 50-55%; f. 12/2023 Echo: EF 20-25%, glob HK, RVSP , mod BAE, triv MR, mod TR.   Pain in gums 04/18/2018   Persistent atrial fibrillation (HCC)    a. 12/2023 s/p TEE/DCCV - 200J-->tikosyn  loaded; b. 01/2024 ERAF->tikosyn  d/c'd.   Pre-diabetes    Pulmonary fibrosis (HCC) 03/02/2021   CT Low Dose Chest: 01/2021: IMPRESSION: Chronic interstitial changes raise the question of pulmonary fibrosis. High-resolution chest CT may prove helpful to further evaluate as clinically warranted.Emphysema (ICD10-J43.9) and Aortic Atherosclerosis (ICD10-170.0)  Recommended pulmonology referral but patient declined. Recommend continued discussion and symptom monitoring.   Pulmonary nodule 1 cm or greater in diameter 03/02/2021   CT low dose Chest: 01/2021: 1. Lung-RADS 4A, suspicious. 10 mm posterior left lower lobe pulmonary nodule, possibly scarring. Follow up low-dose chest CT without contrast in 3 months (please use the following order, CT CHEST LCS NODULE FOLLOW-UP W/O CM) is recommended. Alternatively, PET may be considered when there is a solid component 8mm or larger.  Plan: Follow up low dose CT ordered in 3 mon   Rib pain on left side 09/17/2020   Right hip pain 11/02/2012   Tendonitis, Achilles, left 05/24/2017   Tobacco abuse 09/29/2014   Urge incontinence 07/19/2021   Vitamin D  deficiency 12/31/2014     Social History   Socioeconomic History   Marital status: Widowed    Spouse name: Not on file   Number of children: 3   Years of education: Not on file   Highest education level: High school graduate  Occupational History   Occupation: retired  Tobacco Use   Smoking status: Former    Current packs/day:  0.00    Average packs/day: 0.5 packs/day for 55.0 years (27.5 ttl pk-yrs)    Types: Cigarettes    Start date: 72    Quit date: 2017    Years since quitting: 8.9    Passive exposure: Past   Smokeless tobacco: Never   Tobacco comments:    Former smoker 01/22/24  Vaping Use   Vaping status: Never Used  Substance and Sexual Activity   Alcohol use: Not Currently   Drug use: No   Sexual activity: Not on file  Other Topics Concern   Not on file  Social History Narrative   Formerly daycare provider. Lives with, daughter Porter and grandkids.   Social Drivers of Health   Tobacco Use: Medium Risk (10/23/2024)   Patient History    Smoking Tobacco Use: Former    Smokeless Tobacco Use: Never    Passive Exposure: Past  Physicist, Medical Strain: Low Risk (04/29/2024)   Overall Financial Resource Strain (CARDIA)    Difficulty of Paying Living Expenses: Not hard at all  Food Insecurity: No Food Insecurity (10/23/2024)   Epic    Worried About Programme Researcher, Broadcasting/film/video in the Last Year: Never true    Ran Out of Food in the Last Year: Never true  Transportation Needs: Unmet Transportation Needs (10/23/2024)   Epic    Lack of Transportation (Medical): Yes    Lack of Transportation (Non-Medical): Yes  Physical Activity: Insufficiently Active (04/29/2024)   Exercise Vital Sign    Days of Exercise per Week: 3 days    Minutes of Exercise per Session: 30 min  Stress: No Stress Concern Present (04/29/2024)   [employer] of Occupational Health - Occupational Stress Questionnaire    Feeling of Stress: Only a little  Social Connections: Moderately Isolated (10/23/2024)   Social Connection and Isolation Panel    Frequency of Communication with Friends and Family: More than three times a week    Frequency of Social Gatherings with Friends and Family: More than three times a week    Attends Religious Services: 1 to 4 times per year    Active Member of [employer] or Organizations: No    Attends Tax Inspector Meetings: Never    Marital Status: Widowed  Depression (PHQ2-9): High Risk (09/10/2024)   Depression (PHQ2-9)    PHQ-2 Score: 20  Alcohol Screen: Low Risk (04/29/2024)   Alcohol Screen    Last Alcohol Screening Score (AUDIT): 0  Housing: Low Risk (10/23/2024)   Epic    Unable to Pay for Housing in the Last Year: No    Number of Times Moved in the Last Year: 0    Homeless in the Last Year: No  Utilities: Not At Risk (10/23/2024)   Epic    Threatened with loss of utilities: No  Health Literacy: Adequate Health Literacy (06/19/2024)   B1300 Health Literacy    Frequency of need for help with medical instructions: Rarely   Education Assessment and Provision:  Detailed education and instructions provided on heart failure disease management including the following:  Signs and symptoms of Heart Failure When to call the physician Importance of daily weights Low sodium diet Fluid restriction Medication management Anticipated future follow-up appointments  Patient education given on each of the above topics.  Patient acknowledges understanding via teach back method and acceptance of all instructions.  Education Materials:  Living Better With Heart Failure Booklet, HF zone tool, & Daily Weight Tracker Tool.  Patient has scale at home: yes Patient has pill box at home: yes    High Risk Criteria for Readmission and/or Poor Patient Outcomes: Heart failure hospital admissions (last 6 months): 2  No Show rate: 4 %  Difficult social situation: No, lives her daughter and [place] daughter.  Demonstrates medication adherence: Yes, either her daughter or Granddaughter make up her pill box every week.  Primary Language: English  Literacy level: Reading, writing, and comprehension  Barriers of Care:   Diet/ fluid restrictions ( Mt. Dew , has cut her salt intake back) Daily weights  Considerations/Referrals:   Referral made to Heart Failure Pharmacist Stewardship: Yes Referral  made to Heart Failure CSW/NCM TOC: NA Referral made to Heart & Vascular TOC clinic: Yes, HF TOC follow up on 11/04/2024 @ 1:30 pm. ( Last seen in [facility] 12/15/23)   Items for Follow-up on DC/TOC: Continued HF education  Diet/ fluid restrictions/ daily weights   Stephane Haddock, BSN, RN Heart Failure Teacher, Adult Education Only

## 2024-10-25 NOTE — Evaluation (Signed)
 Physical Therapy Evaluation Patient Details Name: Donna Herrera MRN: [id number] DOB: 1946-07-15 Today's Date: 10/25/2024  History of Present Illness  78 y/o F presenting to ED on 12/23 with worsening BLE edema and dyspnea, BRBPR. Admitted for CHF exacerbation.     PMH includes HTN, HFrEF, A fib, gout, GERD, HLD, prediabetes, COPD, pulmonary fibrosis, bil RTC repair, R THA  Clinical Impression  Prior to admittance, pt reports mod I with RW and occasionally needs assistance for transfers. Pt presents to evaluation with deficits in mobility, strength, power, activity tolerance, pain, and balance. Pt required +2 physical assistance for transfers with AD and minimal physical assistance with AD for ambulation (see gait section). PT will continue to treat pt while she is admitted. Currently recommending follow up therapies < 3 hours per day. Pending progress and if pt refuses, will need 24/7 assist for mobility and HHPT.       If plan is discharge home, recommend the following: A little help with walking and/or transfers;A little help with bathing/dressing/bathroom;Assistance with cooking/housework;Direct supervision/assist for medications management;Direct supervision/assist for financial management;Assist for transportation;Help with stairs or ramp for entrance   Can travel by private vehicle        Equipment Recommendations None recommended by PT  Recommendations for Other Services       Functional Status Assessment Patient has had a recent decline in their functional status and demonstrates the ability to make significant improvements in function in a reasonable and predictable amount of time.     Precautions / Restrictions Precautions Precautions: Fall Recall of Precautions/Restrictions: Impaired Restrictions Weight Bearing Restrictions Per Provider Order: No      Mobility  Bed Mobility Overal bed mobility: Needs Assistance Bed Mobility: Supine to Sit, Sit to Supine      Supine to sit: Mod assist, HOB elevated, Used rails Sit to supine: Mod assist, HOB elevated, Used rails   General bed mobility comments: Pt able to initiate BLE towards EOB but despite VC and physical facilitation has significnat difficulty performing trunk flexion. VC given for sequencing; increased time to complete. Pt reuqires physical assistance for sit to supine for BLE management.    Transfers Overall transfer level: Needs assistance Equipment used: Rolling walker (2 wheels) Transfers: Sit to/from Stand Sit to Stand: Min assist, +2 physical assistance, From elevated surface           General transfer comment: Pt completed STS from EOB w/ RW and +2 min A for initial power-up. Pt having significant diffulcty extending L hip to get COM over BOS. VC given for sequencing; increased time to complete.    Ambulation/Gait Ambulation/Gait assistance: Min assist Gait Distance (Feet): 10 Feet Assistive device: Rolling walker (2 wheels) Gait Pattern/deviations: Step-to pattern, Decreased step length - right, Decreased step length - left, Decreased stride length, Decreased dorsiflexion - right, Decreased dorsiflexion - left, Wide base of support Gait velocity: reduced Gait velocity interpretation: <1.31 ft/sec, indicative of household ambulator   General Gait Details: Pt ambulates with reduced hip and knee flexion bilaterally with RLE in significant ER, dragging throughout swing phase. Increased reliance on RW for stability  Stairs            Wheelchair Mobility     Tilt Bed    Modified Rankin (Stroke Patients Only)       Balance Overall balance assessment: Needs assistance Sitting-balance support: Bilateral upper extremity supported, Feet supported Sitting balance-Leahy Scale: Fair Sitting balance - Comments: able to sit EOB but with significant posterior lean Postural control:  Posterior lean Standing balance support: During functional activity, Reliant on assistive  device for balance Standing balance-Leahy Scale: Poor Standing balance comment: reliant on external support                             Pertinent Vitals/Pain Pain Assessment Pain Assessment: Faces Faces Pain Scale: Hurts little more Pain Location: back Pain Descriptors / Indicators: Discomfort, Grimacing, Moaning Pain Intervention(s): Limited activity within patient's tolerance, Monitored during session    Home Living Family/patient expects to be discharged to:: Private residence Living Arrangements: Children Available Help at Discharge: Family;Available PRN/intermittently (daughter and grandaughter) Type of Home: House Home Access: Ramped entrance       Home Layout: One level Home Equipment: Grab bars - tub/shower;Grab bars - toilet;Rolling Walker (2 wheels);Crutches Additional Comments: wears glasses all the time    Prior Function Prior Level of Function : Needs assist       Physical Assist : Mobility (physical) Mobility (physical): Bed mobility;Transfers   Mobility Comments: pt reports using RW for all mobility needs ADLs Comments: family assists with meds, ADLs,  trasnportation     Extremity/Trunk Assessment   Upper Extremity Assessment Upper Extremity Assessment: Generalized weakness    Lower Extremity Assessment Lower Extremity Assessment: Generalized weakness;Difficult to assess due to impaired cognition (Decreased ROM bilaterally with knee flexion and extension as well as hip flexion)    Cervical / Trunk Assessment Cervical / Trunk Assessment: Kyphotic  Communication   Communication Communication: No apparent difficulties    Cognition Arousal: Alert Behavior During Therapy: WFL for tasks assessed/performed   PT - Cognitive impairments: No family/caregiver present to determine baseline, Awareness, Memory, Sequencing, Problem solving, Safety/Judgement                       PT - Cognition Comments: Pt attempted to mobilizing OOB  despite therapist informing pt of safety concerns with pt reporting, give me that walker and I am going to walk myself out of here. Therapist informed pt unless therapist could have +2 for OOB mobility therapist will not mobilize pt OOB. Following commands: Impaired Following commands impaired: Follows one step commands with increased time     Cueing Cueing Techniques: Verbal cues     General Comments General comments (skin integrity, edema, etc.): VSS on RA    Exercises     Assessment/Plan    PT Assessment Patient needs continued PT services  PT Problem List Decreased strength;Decreased range of motion;Decreased activity tolerance;Decreased balance;Decreased mobility;Decreased coordination;Decreased cognition;Decreased knowledge of use of DME;Decreased safety awareness;Pain       PT Treatment Interventions DME instruction;Gait training;Functional mobility training;Therapeutic activities;Therapeutic exercise;Balance training;Neuromuscular re-education;Patient/family education;Wheelchair mobility training;Cognitive remediation;Manual techniques    PT Goals (Current goals can be found in the Care Plan section)  Acute Rehab PT Goals Patient Stated Goal: to go home PT Goal Formulation: With patient Time For Goal Achievement: 11/08/24 Potential to Achieve Goals: Fair    Frequency Min 2X/week     Co-evaluation               AM-PAC PT 6 Clicks Mobility  Outcome Measure Help needed turning from your back to your side while in a flat bed without using bedrails?: A Little Help needed moving from lying on your back to sitting on the side of a flat bed without using bedrails?: A Lot Help needed moving to and from a bed to a chair (including a wheelchair)?: A Little  Help needed standing up from a chair using your arms (e.g., wheelchair or bedside chair)?: A Lot Help needed to walk in hospital room?: A Little Help needed climbing 3-5 steps with a railing? : Total 6 Click Score:  14    End of Session Equipment Utilized During Treatment: Gait belt Activity Tolerance: Patient tolerated treatment well Patient left: in bed;with call bell/phone within reach;with bed alarm set Nurse Communication: Mobility status PT Visit Diagnosis: Unsteadiness on feet (R26.81);Other abnormalities of gait and mobility (R26.89);Muscle weakness (generalized) (M62.81);Pain    Time: 8997-8965 PT Time Calculation (min) (ACUTE ONLY): 32 min   Charges:   PT Evaluation $PT Eval Moderate Complexity: 1 Mod   PT General Charges $$ ACUTE PT VISIT: 1 Visit         Leontine Hilt DPT Acute Rehab Services [phone] Prefer contact via chat   Leontine NOVAK Jhania Etherington 10/25/2024, 11:11 AM

## 2024-10-25 NOTE — Assessment & Plan Note (Addendum)
 Bedside RN reported streak of blood in large, formed bowel movement overnight.  No signs or symptoms of active bleeding at the time and patient denies pain, lightheadedness.  AM CBC with stable hemoglobin.  Most likely due to known internal hemorrhoid -- Continue to monitor clinically.

## 2024-10-26 DIAGNOSIS — Z7901 Long term (current) use of anticoagulants: Secondary | ICD-10-CM | POA: Diagnosis not present

## 2024-10-26 DIAGNOSIS — I5043 Acute on chronic combined systolic (congestive) and diastolic (congestive) heart failure: Secondary | ICD-10-CM

## 2024-10-26 DIAGNOSIS — I428 Other cardiomyopathies: Secondary | ICD-10-CM | POA: Diagnosis not present

## 2024-10-26 DIAGNOSIS — I493 Ventricular premature depolarization: Secondary | ICD-10-CM | POA: Diagnosis not present

## 2024-10-26 DIAGNOSIS — I4821 Permanent atrial fibrillation: Secondary | ICD-10-CM

## 2024-10-26 DIAGNOSIS — I5023 Acute on chronic systolic (congestive) heart failure: Secondary | ICD-10-CM

## 2024-10-26 DIAGNOSIS — I5082 Biventricular heart failure: Secondary | ICD-10-CM | POA: Diagnosis not present

## 2024-10-26 LAB — BASIC METABOLIC PANEL WITH GFR
Anion gap: 9 (ref 5–15)
BUN: 14 mg/dL (ref 8–23)
CO2: 31 mmol/L (ref 22–32)
Calcium: 8.9 mg/dL (ref 8.9–10.3)
Chloride: 94 mmol/L — ABNORMAL LOW (ref 98–111)
Creatinine, Ser: 0.68 mg/dL (ref 0.44–1.00)
GFR, Estimated: >60 mL/min
Glucose, Bld: 82 mg/dL (ref 70–99)
Potassium: 4.1 mmol/L (ref 3.5–5.1)
Sodium: 135 mmol/L (ref 135–145)

## 2024-10-26 LAB — MAGNESIUM: Magnesium: 2.1 mg/dL (ref 1.7–2.4)

## 2024-10-26 MED ORDER — LOSARTAN POTASSIUM 25 MG PO TABS
12.5000 mg | ORAL_TABLET | Freq: Every day | ORAL | Status: DC
  Administered 2024-10-26: 12.5 mg via ORAL
  Filled 2024-10-26: qty 1

## 2024-10-26 MED ORDER — MIDODRINE HCL 5 MG PO TABS
7.5000 mg | ORAL_TABLET | Freq: Three times a day (TID) | ORAL | Status: DC
  Administered 2024-10-26 – 2024-10-28 (×6): 7.5 mg via ORAL
  Filled 2024-10-26 (×4): qty 2

## 2024-10-26 MED ORDER — METOPROLOL SUCCINATE ER 25 MG PO TB24
25.0000 mg | ORAL_TABLET | Freq: Every day | ORAL | Status: DC
  Administered 2024-10-26 – 2024-10-28 (×3): 25 mg via ORAL
  Filled 2024-10-26 (×2): qty 1

## 2024-10-26 NOTE — Plan of Care (Signed)

## 2024-10-26 NOTE — Progress Notes (Signed)
 "    Daily Progress Note Intern Pager: [phone]  Patient name: Donna Herrera Medical record number: [id number] Date of birth: 1946/02/17 Age: 78 y.o. Gender: female  Primary Care Provider: Lonnie Earnest, MD Consultants: Cardiology Code Status: DNR/DNI  Pt Overview and Major Events to Date:  12/23 - Admitted to FMTS in HF exacerbation 12/24 - Consult Cardiology  Medical Decision Making: Donna Herrera is a 78 year old female with history of HFrEF (EF less than 20%), hypertension, permanent A-fib, gout, GERD, HLD, COPD, prediabetes and pulmonary fibrosis presented with dyspnea secondary to heart failure exacerbation.  Continuing to diurese and approaching readiness for discharge, plan for HHPT.  Assessment & Plan Acute exacerbation of CHF (congestive heart failure) (HCC) HFrEF (heart failure with reduced ejection fraction) (HCC) Stable and much improved. Down 8 L since admission, 3.4 L in the past 24 hours.  Weight 141lb (dry weight around 144 from most recent hospital discharge).  Holding home antihypertensives due to soft blood pressures while diuresing. -- Cardiology recommendations 12/26  -- Decrease to Lasix  40 mg PO twice daily  -- Midodrine  5 mg 3 times daily -- Hold home GDMT (losartan , spironolactone , metoprolol ), slowly uptitrate as renal function and blood pressure allow -- Strict I's/O's - Daily weights -- Continuous telemetry --AM BMP, mag with goal K>4, Mg>2 QT prolongation Noted after episode of chest pain on 12/24.  EKG 12/25 with slight improvement. --Avoid QT prolonging agents as able --Continue to monitor with telemetry Persistent atrial fibrillation (HCC) Cardiac pacemaker in situ Device interrogated on 12/26, functioning normally -- Continue home Eliquis  5 mg twice daily -- Avoid AV nodal blocking agents Pyelonephritis Symptoms improved and stable.  Urine culture grew pansensitive E. coli -- Discontinue ceftriaxone , start cefadroxil   1000 mg twice daily (end date 12/30) --Follow-up susceptibilities to narrow ABX BRBPR (bright red blood per rectum) Streak of blood in bowel movement overnight 12/26.  Patient asymptomatic with stable CBC.  Most likely secondary to known internal hemorrhoid -- Continue to monitor Chronic health problem Gout: Continue home allopurinol  100 mg daily and colchicine  0.6 mg daily HLD: Continue home ezetimibe  10 mg daily Chronic pain syndrome: Continue home oxycodone  5 mg every 6 hours as needed Sacral pressure ulcer: wound care and frequent repositioning Neuropathy: Home gabapentin  300 mg nightly, home duloxetine  30 mg daily  FEN/GI: Heart healthy PPx: Home Eliquis  Dispo: Home health PT and aide  Subjective:  Patient states she feels well this morning, states legs are much less swollen and no difficulty breathing.  No questions or concerns  Objective: Temp:  [97.6 F (36.4 C)-98.4 F (36.9 C)] 98.3 F (36.8 C) (12/27 0347) Pulse Rate:  [68-70] 70 (12/27 0008) Resp:  [11-21] 21 (12/27 0352) BP: (104-116)/(58-80) 104/60 (12/27 0347) SpO2:  [96 %-100 %] 96 % (12/27 0347) Weight:  [64.1 kg] 64.1 kg (12/27 0352) Physical Exam: General: Resting comfortably in bed, awake and conversant, in no acute distress Cardiovascular: RRR, normal S1/S2, no M/R/G Respiratory: CTAB, normal WOB on RA, no W/R/R Abdomen: Normoactive bowel sounds, soft, nontender, nondistended Extremities: Trace pitting edema to BLE  Laboratory: Most recent CBC Lab Results  Component Value Date   WBC 4.8 10/25/2024   HGB 12.4 10/25/2024   HCT 39.7 10/25/2024   MCV 90.4 10/25/2024   PLT 204 10/25/2024   Most recent BMP    Latest Ref Rng & Units 10/25/2024    3:50 AM  BMP  Glucose 70 - 99 mg/dL 893   BUN 8 - 23 mg/dL 16   Creatinine  0.44 - 1.00 mg/dL 9.10   Sodium 864 - 854 mmol/L 137   Potassium 3.5 - 5.1 mmol/L 4.1   Chloride 98 - 111 mmol/L 97   CO2 22 - 32 mmol/L 31   Calcium  8.9 - 10.3 mg/dL 9.0     Jerrold Haskell, Rea, MD 10/26/2024, 5:12 AM  PGY-2, [facility] FPTS Intern pager: [phone], text pages welcome Secure chat group [facility] Teaching Service   "

## 2024-10-26 NOTE — Assessment & Plan Note (Addendum)
 Stable and much improved. Down 8 L since admission, 3.4 L in the past 24 hours.  Weight 141lb (dry weight around 144 from most recent hospital discharge).  Holding home antihypertensives due to soft blood pressures while diuresing. -- Cardiology recommendations 12/26  -- Decrease to Lasix  40 mg PO twice daily  -- Midodrine  5 mg 3 times daily -- Hold home GDMT (losartan , spironolactone , metoprolol ), slowly uptitrate as renal function and blood pressure allow -- Strict I's/O's - Daily weights -- Continuous telemetry --AM BMP, mag with goal K>4, Mg>2

## 2024-10-26 NOTE — Assessment & Plan Note (Signed)
 Symptoms improved and stable.  Urine culture grew pansensitive E. coli -- Discontinue ceftriaxone , start cefadroxil  1000 mg twice daily (end date 12/30) --Follow-up susceptibilities to narrow ABX

## 2024-10-26 NOTE — Assessment & Plan Note (Signed)
 Device interrogated on 12/26, functioning normally -- Continue home Eliquis  5 mg twice daily -- Avoid AV nodal blocking agents

## 2024-10-26 NOTE — Plan of Care (Signed)

## 2024-10-26 NOTE — Assessment & Plan Note (Signed)
 Streak of blood in bowel movement overnight 12/26.  Patient asymptomatic with stable CBC.  Most likely secondary to known internal hemorrhoid -- Continue to monitor

## 2024-10-26 NOTE — Progress Notes (Addendum)
 "   Progress Note  Patient Name: Donna Herrera Date of Encounter: 10/26/2024  Primary Cardiologist: Vina Gull, MD  Subjective   SOB significantly improved.   Inpatient Medications    Scheduled Meds:  allopurinol   100 mg Oral Daily   apixaban   5 mg Oral BID   cefadroxil   1,000 mg Oral BID   colchicine   0.6 mg Oral Daily   DULoxetine   30 mg Oral Daily   ezetimibe   10 mg Oral Daily   furosemide   40 mg Intravenous Q12H   gabapentin   300 mg Oral QHS   midodrine   5 mg Oral TID WC   Continuous Infusions:  PRN Meds: oxyCODONE    Vital Signs    Vitals:   10/26/24 0008 10/26/24 0347 10/26/24 0352 10/26/24 0830  BP: 114/74 104/60  107/71  Pulse: 70   69  Resp: 17 18 (!) 21 17  Temp: 98.3 F (36.8 C) 98.3 F (36.8 C)  97.8 F (36.6 C)  TempSrc: Oral Oral  Oral  SpO2: 96% 96%  100%  Weight:   64.1 kg   Height:        Intake/Output Summary (Last 24 hours) at 10/26/2024 0936 Last data filed at 10/26/2024 0900 Gross per 24 hour  Intake 816 ml  Output 4250 ml  Net -3434 ml   Filed Weights   10/24/24 0405 10/25/24 0315 10/26/24 0352  Weight: 70.2 kg 71.4 kg 64.1 kg    Telemetry     Personally reviewed. Paced rhythm with frequent PVCs  ECG    Not performed today.  Physical Exam   GEN: No acute distress.   Neck:  JVD. Cardiac: RRR, no murmur, rub, or gallop.  Respiratory: Nonlabored. Clear to auscultation bilaterally. GI: Soft, nontender, bowel sounds present. MS: 1+ pitting edema in LLE; No deformity. Neuro:  Nonfocal. Psych: Alert and oriented x 3. Normal affect.  Labs    Chemistry Recent Labs  Lab 10/22/24 2030 10/23/24 0604 10/24/24 0315 10/25/24 0350 10/26/24 0412  NA 136   < > 139 137 135  K 3.4*   < > 4.4 4.1 4.1  CL 97*   < > 100 97* 94*  CO2 26   < > 30 31 31   GLUCOSE 84   < > 104* 106* 82  BUN 15   < > 17 16 14   CREATININE 0.74   < > 0.95 0.89 0.68  CALCIUM  8.8*   < > 8.8* 9.0 8.9  PROT 7.3  --   --   --   --   ALBUMIN  3.7  --   --   --   --   AST 35  --   --   --   --   ALT 8  --   --   --   --   ALKPHOS 112  --   --   --   --   BILITOT 1.0  --   --   --   --   GFRNONAA >60   < > >60 >60 >60  ANIONGAP 13   < > 9 9 9    < > = values in this interval not displayed.     Hematology Recent Labs  Lab 10/22/24 1900 10/24/24 0315 10/25/24 0350  WBC 4.7 4.6 4.8  RBC 4.50 4.33 4.39  HGB 12.9 12.4 12.4  HCT 40.7 38.9 39.7  MCV 90.4 89.8 90.4  MCH 28.7 28.6 28.2  MCHC 31.7 31.9 31.2  RDW 17.5* 17.3* 17.3*  PLT 251 191 204    Cardiac EnzymesNo results for input(s): TROPONINIHS in the last 720 hours.  BNP Recent Labs  Lab 10/22/24 2030  PROBNP 6,767.0*     DDimerNo results for input(s): DDIMER in the last 168 hours.   Radiology    No results found.   Assessment & Plan   Acute on chronic systolic and diastolic heart failure Biventricular heart failure Nonischemic cardiomyopathy s/p  CRT-P - Received IV Lasix  80 mg in the a.m. and 40 mg the p.m. yesterday.  Then later switched to IV Lasix  40 mg twice daily due to softer blood pressures requiring midodrine . 4.2 L urine output in the last 24 hours with a net -3.4 L. - Continue IV Lasix  40 mg twice daily, anticipate transitioning to p.o. diuretics tomorrow. Increased midodrine  dose from 5 mg to 7.5 mg TID to optimize GDMT. - Start metoprolol  succinate 25 mg once daily. - Did not tolerate lisinopril  due to cough, did not tolerate Entresto  due to hypotension.  Had joint pains with the losartan .  Trial of losartan  was attempted back in Jan 2025 but later had to be discontinued due to hypotension.  Now back on losartan  (on home meds), held here due to hypotension.  Restart losartan  12.5 mg once daily. - Not a candidate for SGLT2 inhibitors due to frequent UTIs in the past.  Persistent atrial fibrillation s/p AV nodal ablation and CRT-P - Start metoprolol  succinate 25 mg once daily. - Continue Eliquis  5 mg twice daily.  Frequent PVCs on  Tele - Start metoprolol  succinate 25 mg once daily.   40 minutes spent in reviewing prior medical records, reports, more than 3 labs, discussion and documentation.  Signed, Diannah SHAUNNA Maywood, MD  10/26/2024, 9:36 AM    "

## 2024-10-26 NOTE — Assessment & Plan Note (Signed)
 Gout: Continue home allopurinol  100 mg daily and colchicine  0.6 mg daily HLD: Continue home ezetimibe  10 mg daily Chronic pain syndrome: Continue home oxycodone  5 mg every 6 hours as needed Sacral pressure ulcer: wound care and frequent repositioning Neuropathy: Home gabapentin  300 mg nightly, home duloxetine  30 mg daily

## 2024-10-26 NOTE — Assessment & Plan Note (Signed)
 Noted after episode of chest pain on 12/24.  EKG 12/25 with slight improvement. --Avoid QT prolonging agents as able --Continue to monitor with telemetry

## 2024-10-27 ENCOUNTER — Encounter (HOSPITAL_COMMUNITY): Payer: Self-pay | Admitting: Family Medicine

## 2024-10-27 DIAGNOSIS — I4821 Permanent atrial fibrillation: Secondary | ICD-10-CM | POA: Diagnosis not present

## 2024-10-27 DIAGNOSIS — I5023 Acute on chronic systolic (congestive) heart failure: Secondary | ICD-10-CM | POA: Diagnosis not present

## 2024-10-27 DIAGNOSIS — I5043 Acute on chronic combined systolic (congestive) and diastolic (congestive) heart failure: Secondary | ICD-10-CM | POA: Diagnosis not present

## 2024-10-27 DIAGNOSIS — I428 Other cardiomyopathies: Secondary | ICD-10-CM | POA: Diagnosis not present

## 2024-10-27 DIAGNOSIS — I5082 Biventricular heart failure: Secondary | ICD-10-CM | POA: Diagnosis not present

## 2024-10-27 LAB — BASIC METABOLIC PANEL WITH GFR
Anion gap: 9 (ref 5–15)
BUN: 13 mg/dL (ref 8–23)
CO2: 31 mmol/L (ref 22–32)
Calcium: 8.9 mg/dL (ref 8.9–10.3)
Chloride: 94 mmol/L — ABNORMAL LOW (ref 98–111)
Creatinine, Ser: 0.62 mg/dL (ref 0.44–1.00)
GFR, Estimated: >60 mL/min
Glucose, Bld: 88 mg/dL (ref 70–99)
Potassium: 3.9 mmol/L (ref 3.5–5.1)
Sodium: 133 mmol/L — ABNORMAL LOW (ref 135–145)

## 2024-10-27 LAB — MAGNESIUM: Magnesium: 2.2 mg/dL (ref 1.7–2.4)

## 2024-10-27 MED ORDER — LOSARTAN POTASSIUM 25 MG PO TABS
25.0000 mg | ORAL_TABLET | Freq: Every day | ORAL | Status: DC
  Administered 2024-10-27 – 2024-10-28 (×2): 25 mg via ORAL
  Filled 2024-10-27: qty 1

## 2024-10-27 MED ORDER — FUROSEMIDE 40 MG PO TABS: 80.0000 mg | ORAL_TABLET | Freq: Two times a day (BID) | ORAL | Status: DC

## 2024-10-27 MED ORDER — POTASSIUM CHLORIDE 20 MEQ PO PACK
20.0000 meq | PACK | Freq: Once | ORAL | Status: AC
  Administered 2024-10-27: 20 meq via ORAL
  Filled 2024-10-27: qty 1

## 2024-10-27 MED ORDER — FUROSEMIDE 40 MG PO TABS
60.0000 mg | ORAL_TABLET | Freq: Two times a day (BID) | ORAL | Status: DC
  Administered 2024-10-27 – 2024-10-28 (×2): 60 mg via ORAL
  Filled 2024-10-27: qty 1

## 2024-10-27 NOTE — Assessment & Plan Note (Addendum)
 Yesterday net - 1.7 L. Weight 148 today (dry weight around 144 from most recent hospital discharge).   -- Cardiology recommendations 12/27  -- Decrease to Lasix  40 mg PO twice daily  -- Midodrine  7.5 mg 3 times daily in order to optimize GDMT -- Restarted metoprolol  25 mg once daily and 12.5 mg of losartan  (lower than her home doses) , slowly uptitrate as renal function and blood pressure allow -- Strict I's/O's - Daily weights - Appreciate continued recommendations -- Continuous telemetry --AM BMP, mag with goal K>4, Mg>2

## 2024-10-27 NOTE — Assessment & Plan Note (Addendum)
 Device interrogated on 12/26, functioning normally -- Continue home Eliquis  5 mg twice daily -- Avoid AV nodal blocking agents

## 2024-10-27 NOTE — Progress Notes (Addendum)
 "   Progress Note  Patient Name: Donna Herrera Date of Encounter: 10/27/2024  Primary Cardiologist: Vina Gull, MD  Subjective   SOB and leg swelling resolved.   Inpatient Medications    Scheduled Meds:  allopurinol   100 mg Oral Daily   apixaban   5 mg Oral BID   cefadroxil   1,000 mg Oral BID   colchicine   0.6 mg Oral Daily   DULoxetine   30 mg Oral Daily   ezetimibe   10 mg Oral Daily   furosemide   40 mg Intravenous Q12H   gabapentin   300 mg Oral QHS   losartan   25 mg Oral Daily   metoprolol  succinate  25 mg Oral Daily   midodrine   7.5 mg Oral TID WC   Continuous Infusions:  PRN Meds: oxyCODONE    Vital Signs    Vitals:   10/27/24 0000 10/27/24 0442 10/27/24 0510 10/27/24 0744  BP: (!) 105/55  100/68 (!) 117/98  Pulse: 73  72   Resp: 16  14 14   Temp: 97.7 F (36.5 C)  98.3 F (36.8 C) 98.3 F (36.8 C)  TempSrc: Oral  Oral Oral  SpO2: 91%  95%   Weight:  67.5 kg    Height:        Intake/Output Summary (Last 24 hours) at 10/27/2024 0852 Last data filed at 10/27/2024 0438 Gross per 24 hour  Intake 596 ml  Output 2300 ml  Net -1704 ml   Filed Weights   10/25/24 0315 10/26/24 0352 10/27/24 0442  Weight: 71.4 kg 64.1 kg 67.5 kg    Telemetry     Personally reviewed. Paced rhythm with frequent PVCs  ECG    Not performed today.  Physical Exam   GEN: No acute distress.   Neck:  JVD. Cardiac: RRR, no murmur, rub, or gallop.  Respiratory: Nonlabored. Clear to auscultation bilaterally. GI: Soft, nontender, bowel sounds present. MS: No pitting edema; No deformity. Neuro:  Nonfocal. Psych: Alert and oriented x 3. Normal affect.  Labs    Chemistry Recent Labs  Lab 10/22/24 2030 10/23/24 0604 10/25/24 0350 10/26/24 0412 10/27/24 0239  NA 136   < > 137 135 133*  K 3.4*   < > 4.1 4.1 3.9  CL 97*   < > 97* 94* 94*  CO2 26   < > 31 31 31   GLUCOSE 84   < > 106* 82 88  BUN 15   < > 16 14 13   CREATININE 0.74   < > 0.89 0.68 0.62   CALCIUM  8.8*   < > 9.0 8.9 8.9  PROT 7.3  --   --   --   --   ALBUMIN 3.7  --   --   --   --   AST 35  --   --   --   --   ALT 8  --   --   --   --   ALKPHOS 112  --   --   --   --   BILITOT 1.0  --   --   --   --   GFRNONAA >60   < > >60 >60 >60  ANIONGAP 13   < > 9 9 9    < > = values in this interval not displayed.     Hematology Recent Labs  Lab 10/22/24 1900 10/24/24 0315 10/25/24 0350  WBC 4.7 4.6 4.8  RBC 4.50 4.33 4.39  HGB 12.9 12.4 12.4  HCT 40.7 38.9 39.7  MCV 90.4  89.8 90.4  MCH 28.7 28.6 28.2  MCHC 31.7 31.9 31.2  RDW 17.5* 17.3* 17.3*  PLT 251 191 204    Cardiac EnzymesNo results for input(s): TROPONINIHS in the last 720 hours.  BNP Recent Labs  Lab 10/22/24 2030  PROBNP 6,767.0*     DDimerNo results for input(s): DDIMER in the last 168 hours.   Radiology    No results found.   Assessment & Plan   Acute on chronic systolic and diastolic heart failure Biventricular heart failure Nonischemic cardiomyopathy s/p  CRT-P - 2.3 L urine output in the last 24 hours with net -1.7 L on IV Lasix  40 mg twice daily.  SOB and leg swelling resolved.  Appears to be compensated. - On p.o. Lasix  40 mg twice daily at home.  Switch IV diuretics to p.o. Lasix  60 mg twice daily. - Continue metoprolol  succinate 25 mg once daily. - Did not tolerate lisinopril  due to cough, did not tolerate Entresto  due to hypotension.  Had joint pains with the losartan .  Trial of losartan  was attempted back in Jan 2025 but later had to be discontinued due to hypotension.  Midodrine  dose increased from 5 mg to 7.5 mg TID, increase losartan  from 12.5 mg to 25 mg once daily. - Not a candidate for SGLT2 inhibitors due to frequent UTIs in the past.  Persistent atrial fibrillation s/p AV nodal ablation and CRT-P - Continue metoprolol  succinate 25 mg once daily.  Started yesterday. - Continue Eliquis  5 mg twice daily.  Frequent PVCs on Tele - continue metoprolol  succinate 25 mg once  daily.  Started yesterday.  [facility] will sign off.   Medication Recommendations: Continue current medications Other recommendations (labs, testing, etc): None Follow up as an outpatient: Will schedule appointment with cardiology in 1 to 2 weeks.   40 minutes spent in reviewing prior medical records, reports, more than 3 labs, discussion and documentation.  Signed, Diannah SHAUNNA Maywood, MD  10/27/2024, 8:52 AM    "

## 2024-10-27 NOTE — Assessment & Plan Note (Addendum)
 Noted after episode of chest pain on 12/24.  Continued to improve on EKG on 12/26 with qtc of 499.  --Avoid QT prolonging agents as able --Continue to monitor with telemetry

## 2024-10-27 NOTE — Assessment & Plan Note (Addendum)
 Gout: Continue home allopurinol  100 mg daily and colchicine  0.6 mg daily HLD: Continue home ezetimibe  10 mg daily Chronic pain syndrome: Continue home oxycodone  5 mg every 6 hours as needed Sacral pressure ulcer: wound care and frequent repositioning Neuropathy: Home gabapentin  300 mg nightly, home duloxetine  30 mg daily

## 2024-10-27 NOTE — Progress Notes (Signed)
 "    Daily Progress Note Intern Pager: [phone]  Patient name: Adalynn Corne Medical record number: [id number] Date of birth: April 11, 1946 Age: 78 y.o. Gender: female  Primary Care Provider: Lonnie Earnest, MD Consultants: Cardiology  Code Status: DNR/DNI  Pt Overview and Major Events to Date:  12/23 - Admitted to FMTS in HF exacerbation 12/24 - Consult Cardiology   Medical Decision Making: Zarayah Lanting is a 78 year old female with history of HFrEF (EF less than 20%), hypertension, permanent A-fib, gout, GERD, HLD, COPD, prediabetes and pulmonary fibrosis presented with dyspnea secondary to heart failure exacerbation.  Continuing to diurese and approaching readiness for discharge, plan for HHPT.  Assessment & Plan Acute exacerbation of CHF (congestive heart failure) (HCC) HFrEF (heart failure with reduced ejection fraction) (HCC) Yesterday net - 1.7 L. Weight 148 today (dry weight around 144 from most recent hospital discharge).   -- Cardiology recommendations 12/27  -- Decrease to Lasix  40 mg PO twice daily  -- Midodrine  7.5 mg 3 times daily in order to optimize GDMT -- Restarted metoprolol  25 mg once daily and 12.5 mg of losartan  (lower than her home doses) , slowly uptitrate as renal function and blood pressure allow -- Strict I's/O's - Daily weights - Appreciate continued recommendations -- Continuous telemetry --AM BMP, mag with goal K>4, Mg>2 QT prolongation Noted after episode of chest pain on 12/24.  Continued to improve on EKG on 12/26 with qtc of 499.  --Avoid QT prolonging agents as able --Continue to monitor with telemetry Persistent atrial fibrillation (HCC) Cardiac pacemaker in situ Device interrogated on 12/26, functioning normally -- Continue home Eliquis  5 mg twice daily -- Avoid AV nodal blocking agents Pyelonephritis Symptoms improved and stable.  Urine culture grew pansensitive E. coli -- Continue cefadroxil  1000 mg twice daily (end  date 12/30) BRBPR (bright red blood per rectum) (Resolved: 10/27/2024) No episodes since 12/26. Most likely due to internal hemorrhoid.  Chronic health problem Gout: Continue home allopurinol  100 mg daily and colchicine  0.6 mg daily HLD: Continue home ezetimibe  10 mg daily Chronic pain syndrome: Continue home oxycodone  5 mg every 6 hours as needed Sacral pressure ulcer: wound care and frequent repositioning Neuropathy: Home gabapentin  300 mg nightly, home duloxetine  30 mg daily   FEN/GI: Heart healthy PPx: Home Eliquis  Dispo: Home health PT and aide pending clinical improvement   Subjective:  NAEON. Patient reports improved swelling. Says she was able to get up and move around yesterday without chest pain, dyspnea, or lightheadedness.   Objective: Temp:  [97.7 F (36.5 C)-98.3 F (36.8 C)] 98.3 F (36.8 C) (12/28 0510) Pulse Rate:  [51-76] 72 (12/28 0510) Resp:  [14-23] 14 (12/28 0510) BP: (100-114)/(55-72) 100/68 (12/28 0510) SpO2:  [91 %-100 %] 95 % (12/28 0510) Weight:  [67.5 kg] 67.5 kg (12/28 0442) Physical Exam: General: Resting comfortably in bed, awake and conversant, in no acute distress Cardiovascular: RRR, normal S1/S2, no M/R/G Respiratory: CTAB, normal WOB on RA Abdomen:  soft, nontender, nondistended Extremities: Trace pitting edema to BLE  Laboratory:  Most recent BMP    Latest Ref Rng & Units 10/27/2024    2:39 AM  BMP  Glucose 70 - 99 mg/dL 88   BUN 8 - 23 mg/dL 13   Creatinine 9.55 - 1.00 mg/dL 9.37   Sodium 864 - 854 mmol/L 133   Potassium 3.5 - 5.1 mmol/L 3.9   Chloride 98 - 111 mmol/L 94   CO2 22 - 32 mmol/L 31   Calcium  8.9 - 10.3 mg/dL  8.9    Magnesium  2.2  Nicholas Bar, MD 10/27/2024, 5:24 AM  PGY-3, [facility] Family Medicine FPTS Intern pager: [phone], text pages welcome Secure chat group [facility] Teaching Service   "

## 2024-10-27 NOTE — Discharge Instructions (Signed)
 Dear Donna Herrera,   Thank you for letting us  participate in your care! You were admitted to the hospital for a heart failure exacerbation and UTI. Cardiology adjusted your medicines. It is important to follow up with them when you leave.    POST-HOSPITAL & CARE INSTRUCTIONS We recommend following up with your PCP within 1 week from being discharged from the hospital. Please let PCP/Specialists know of any changes in medications that were made which you will be able to see in the medications section of this packet.  DOCTOR'S APPOINTMENTS & FOLLOW UP Future Appointments  Date Time Provider Department Center  11/04/2024  1:30 PM [facility] None  01/01/2025  7:00 AM [facility] H[facility] DEVICE REMOTES [facility]  01/06/2025  3:10 PM Lelon Hamilton T, PA-C [facility]  04/02/2025  7:00 AM [facility] HVT DEVICE REMOTES [facility]  05/01/2025  2:50 PM [facility] ANNUAL WELLNESS VISIT [facility]  07/02/2025  7:00 AM [facility] HVT DEVICE REMOTES [facility]  10/01/2025  7:00 AM [facility] HVT DEVICE REMOTES [facility]  12/31/2025  7:00 AM [facility] HVT DEVICE REMOTES [facility]     Thank you for choosing [facility]! Take care and be well!  Family Medicine Teaching Service Inpatient Team [place]  [facility]  [address] [place], [place] [address] [phone]

## 2024-10-27 NOTE — Plan of Care (Signed)

## 2024-10-27 NOTE — Assessment & Plan Note (Addendum)
 Symptoms improved and stable.  Urine culture grew pansensitive E. coli -- Continue cefadroxil  1000 mg twice daily (end date 12/30)

## 2024-10-27 NOTE — Assessment & Plan Note (Addendum)
 No episodes since 12/26. Most likely due to internal hemorrhoid.

## 2024-10-28 ENCOUNTER — Other Ambulatory Visit (HOSPITAL_COMMUNITY): Payer: Self-pay

## 2024-10-28 DIAGNOSIS — R601 Generalized edema: Secondary | ICD-10-CM | POA: Diagnosis not present

## 2024-10-28 LAB — BASIC METABOLIC PANEL WITH GFR
Anion gap: 12 (ref 5–15)
BUN: 19 mg/dL (ref 8–23)
CO2: 26 mmol/L (ref 22–32)
Calcium: 8.9 mg/dL (ref 8.9–10.3)
Chloride: 94 mmol/L — ABNORMAL LOW (ref 98–111)
Creatinine, Ser: 0.91 mg/dL (ref 0.44–1.00)
GFR, Estimated: >60 mL/min
Glucose, Bld: 90 mg/dL (ref 70–99)
Potassium: 4.8 mmol/L (ref 3.5–5.1)
Sodium: 131 mmol/L — ABNORMAL LOW (ref 135–145)

## 2024-10-28 LAB — MAGNESIUM: Magnesium: 2.2 mg/dL (ref 1.7–2.4)

## 2024-10-28 MED ORDER — SPIRONOLACTONE 25 MG PO TABS
12.5000 mg | ORAL_TABLET | Freq: Every day | ORAL | 0 refills | Status: DC
  Filled 2024-10-28: qty 30, 60d supply, fill #0

## 2024-10-28 MED ORDER — LOSARTAN POTASSIUM 25 MG PO TABS
12.5000 mg | ORAL_TABLET | Freq: Every day | ORAL | 0 refills | Status: DC
  Filled 2024-10-28: qty 15, 30d supply, fill #0

## 2024-10-28 MED ORDER — METOPROLOL SUCCINATE ER 25 MG PO TB24
25.0000 mg | ORAL_TABLET | Freq: Every day | ORAL | 0 refills | Status: DC
  Filled 2024-10-28: qty 30, 30d supply, fill #0

## 2024-10-28 MED ORDER — MIDODRINE HCL 2.5 MG PO TABS
7.5000 mg | ORAL_TABLET | Freq: Three times a day (TID) | ORAL | 0 refills | Status: DC
  Filled 2024-10-28: qty 90, 10d supply, fill #0

## 2024-10-28 MED ORDER — FUROSEMIDE 20 MG PO TABS
60.0000 mg | ORAL_TABLET | Freq: Two times a day (BID) | ORAL | 0 refills | Status: AC
  Filled 2024-10-28: qty 180, 30d supply, fill #0

## 2024-10-28 MED ORDER — CEFADROXIL 500 MG PO CAPS
1000.0000 mg | ORAL_CAPSULE | Freq: Two times a day (BID) | ORAL | 0 refills | Status: AC
  Filled 2024-10-28: qty 3, 1d supply, fill #0

## 2024-10-28 NOTE — Assessment & Plan Note (Addendum)
 Gout: Continue home allopurinol  100 mg daily and colchicine  0.6 mg daily HLD: Continue home ezetimibe  10 mg daily Chronic pain syndrome: Continue home oxycodone  5 mg every 6 hours as needed Sacral pressure ulcer: wound care and frequent repositioning Neuropathy: Home gabapentin  300 mg nightly, home duloxetine  30 mg daily

## 2024-10-28 NOTE — Progress Notes (Signed)
 Physical Therapy Treatment Patient Details Name: Donna Herrera MRN: [id number] DOB: [birth year] Today's Date: 10/28/2024   History of Present Illness 78 y/o F presenting to ED on 12/23 with worsening BLE edema and dyspnea, BRBPR. Admitted for CHF exacerbation.     PMH includes HTN, HFrEF, A fib, gout, GERD, HLD, prediabetes, COPD, pulmonary fibrosis, bil RTC repair, R THA    PT Comments  Pt much improved functionally and cognitively. Pt with limited mobility at baseline due to minimal bilat hip flexion ROM. Pt able to transfer with minA and ambulated with RW and contact guard assist for about 30' in room. Pt near functional baseline. Pt safe to return home with assist of family as they did PTA. Acute PT to cont to follow.    If plan is discharge home, recommend the following: A little help with walking and/or transfers;A little help with bathing/dressing/bathroom;Assistance with cooking/housework;Direct supervision/assist for medications management;Direct supervision/assist for financial management;Assist for transportation;Help with stairs or ramp for entrance   Can travel by private vehicle        Equipment Recommendations  None recommended by PT    Recommendations for Other Services       Precautions / Restrictions Precautions Precautions: Fall Recall of Precautions/Restrictions: Impaired Restrictions Weight Bearing Restrictions Per Provider Order: No     Mobility  Bed Mobility Overal bed mobility: Needs Assistance Bed Mobility: Supine to Sit, Sit to Supine     Supine to sit: Mod assist, HOB elevated, Used rails Sit to supine: Mod assist, HOB elevated, Used rails   General bed mobility comments: pt asked for assist for bilat LE management, pt reporting her family helps her in/out of bed with LE management due to limited ROM in bilat Hips    Transfers Overall transfer level: Needs assistance Equipment used: Rolling walker (2 wheels) Transfers: Sit to/from  Stand Sit to Stand: Min assist           General transfer comment: pt requires bed height very elevated (pt does this at home) due to minimal hip flexion ROM. minA to bring pt forward to bring body weight over feet, when returning to bed pt immediately lays on bed due to no hip flexion    Ambulation/Gait Ambulation/Gait assistance: Min assist, Contact guard assist Gait Distance (Feet): 30 Feet Assistive device: Rolling walker (2 wheels) Gait Pattern/deviations: Step-to pattern, Decreased step length - right, Decreased step length - left, Decreased stride length, Decreased dorsiflexion - right, Decreased dorsiflexion - left, Wide base of support Gait velocity: reduced Gait velocity interpretation: <1.31 ft/sec, indicative of household ambulator   General Gait Details: Pt ambulates with reduced hip and knee flexion bilaterally with RLE in significant ER, dragging L LE throughout swing phase. Increased reliance on RW for stability, pt initially minA but then transitioned to contact guard.   Stairs             Wheelchair Mobility     Tilt Bed    Modified Rankin (Stroke Patients Only)       Balance Overall balance assessment: Needs assistance Sitting-balance support: Bilateral upper extremity supported, Feet supported Sitting balance-Leahy Scale: Fair Sitting balance - Comments: able to sit EOB but with significant posterior lean Postural control: Posterior lean Standing balance support: During functional activity, Reliant on assistive device for balance Standing balance-Leahy Scale: Poor Standing balance comment: reliant on external support  Communication Communication Communication: No apparent difficulties  Cognition Arousal: Alert Behavior During Therapy: WFL for tasks assessed/performed                           PT - Cognition Comments: pt appropriate and cooperative with therapist, able to direct how to properly  assist her Following commands: Intact      Cueing Cueing Techniques: Verbal cues  Exercises      General Comments General comments (skin integrity, edema, etc.): VSS on RA, pt with saturated maxi pad and sacral dressing, bed changed and wet pads removed      Pertinent Vitals/Pain Pain Assessment Pain Assessment: Faces Faces Pain Scale: Hurts little more Pain Location: chronic L hip pain Pain Descriptors / Indicators: Discomfort, Grimacing, Moaning Pain Intervention(s): Monitored during session    Home Living                          Prior Function            PT Goals (current goals can now be found in the care plan section) Acute Rehab PT Goals Patient Stated Goal: to go home PT Goal Formulation: With patient Time For Goal Achievement: 11/08/24 Potential to Achieve Goals: Fair Progress towards PT goals: Progressing toward goals    Frequency    Min 2X/week      PT Plan      Co-evaluation              AM-PAC PT 6 Clicks Mobility   Outcome Measure  Help needed turning from your back to your side while in a flat bed without using bedrails?: A Little Help needed moving from lying on your back to sitting on the side of a flat bed without using bedrails?: A Lot Help needed moving to and from a bed to a chair (including a wheelchair)?: A Little Help needed standing up from a chair using your arms (e.g., wheelchair or bedside chair)?: A Lot Help needed to walk in hospital room?: A Little Help needed climbing 3-5 steps with a railing? : Total 6 Click Score: 14    End of Session Equipment Utilized During Treatment: Gait belt Activity Tolerance: Patient tolerated treatment well Patient left: in bed;with call bell/phone within reach;with bed alarm set Nurse Communication: Mobility status PT Visit Diagnosis: Unsteadiness on feet (R26.81);Other abnormalities of gait and mobility (R26.89);Muscle weakness (generalized) (M62.81);Pain     Time:  8660-8587 PT Time Calculation (min) (ACUTE ONLY): 33 min  Charges:    $Gait Training: 8-22 mins $Therapeutic Activity: 8-22 mins PT General Charges $$ ACUTE PT VISIT: 1 Visit                     Norene Ames, PT, DPT Acute Rehabilitation Services Secure chat preferred Office #: [phone]    Norene CHRISTELLA Ames 10/28/2024, 2:50 PM

## 2024-10-28 NOTE — Assessment & Plan Note (Addendum)
 UOP last 24h s/p one dose of 60 PO lasix  in the pm and 40mg  iv in the AM. Today's weight is 140lbs down from 148lbs.  -Na 131 slightly downtrending last two days -- Cardiology consulted, appreciate recommendations -GDMT plan in anticipation of discharge:  -- Lasix  60mg  PO BID  -- Midodrine  7.5 mg 3 times daily due to persistently low BP -- metoprolol  25 mg once daily  -- 12.5 mg of losartan   -- Spironolactone  12.5 mg  -- Strict I's/O's - Daily weights -AM BMP if patient stays overnight

## 2024-10-28 NOTE — Plan of Care (Signed)
 Patient ID: Donna Herrera, female   DOB: [birth year], 78 y.o.   MRN: [id number]  Problem: Education: Goal: Knowledge of General Education information will improve Description: Including pain rating scale, medication(s)/side effects and non-pharmacologic comfort measures Outcome: Adequate for Discharge   Problem: Health Behavior/Discharge Planning: Goal: Ability to manage health-related needs will improve Outcome: Adequate for Discharge   Problem: Clinical Measurements: Goal: Ability to maintain clinical measurements within normal limits will improve Outcome: Adequate for Discharge Goal: Will remain free from infection Outcome: Adequate for Discharge Goal: Diagnostic test results will improve Outcome: Adequate for Discharge Goal: Respiratory complications will improve Outcome: Adequate for Discharge Goal: Cardiovascular complication will be avoided Outcome: Adequate for Discharge   Problem: Activity: Goal: Risk for activity intolerance will decrease Outcome: Adequate for Discharge   Problem: Nutrition: Goal: Adequate nutrition will be maintained Outcome: Adequate for Discharge   Problem: Coping: Goal: Level of anxiety will decrease Outcome: Adequate for Discharge   Problem: Elimination: Goal: Will not experience complications related to bowel motility Outcome: Adequate for Discharge Goal: Will not experience complications related to urinary retention Outcome: Adequate for Discharge   Problem: Pain Managment: Goal: General experience of comfort will improve and/or be controlled Outcome: Adequate for Discharge   Problem: Safety: Goal: Ability to remain free from injury will improve Outcome: Adequate for Discharge   Problem: Skin Integrity: Goal: Risk for impaired skin integrity will decrease Outcome: Adequate for Discharge   Problem: Acute Rehab OT Goals (only OT should resolve) Goal: Pt. Will Perform Upper Body Dressing Outcome: Adequate for  Discharge Goal: Pt. Will Perform Lower Body Dressing Outcome: Adequate for Discharge Goal: Pt. Will Transfer To Toilet Outcome: Adequate for Discharge Goal: OT Additional ADL Goal #1 Outcome: Adequate for Discharge   Problem: Acute Rehab PT Goals(only PT should resolve) Goal: Pt Will Go Supine/Side To Sit Outcome: Adequate for Discharge Goal: Pt Will Go Sit To Supine/Side Outcome: Adequate for Discharge Goal: Patient Will Transfer Sit To/From Stand Outcome: Adequate for Discharge Goal: Pt Will Ambulate Outcome: Adequate for Discharge Goal: Pt/caregiver will Perform Home Exercise Program Outcome: Adequate for Discharge    Verdie JONETTA Collier, RN

## 2024-10-28 NOTE — Assessment & Plan Note (Addendum)
 Noted after episode of chest pain on 12/24.  Continued to improve on EKG on 12/26 with qtc of 499.  --Avoid QT prolonging agents as able --Continue to monitor with telemetry

## 2024-10-28 NOTE — Discharge Summary (Addendum)
 "  Family Medicine Teaching [facility] Discharge Summary  Patient name: Donna Herrera Medical record number: [id number] Date of birth: 20-Mar-1946 Age: 78 y.o. Gender: female Date of Admission: 10/22/2024  Date of Discharge: 10-28-24 Admitting Physician: Rollene FORBES Keeling, MD  Primary Care Provider: Lonnie Earnest, MD Consultants: Cardiology  Indication for Hospitalization: CHF exacerbation  Discharge Diagnoses/Problem List:  Principal Problem for Admission:  CHF exacerbation Other Problems addressed during stay:  Principal Problem:   Acute on chronic systolic heart failure (HCC) Active Problems:   HFrEF (heart failure with reduced ejection fraction) (HCC)   Urinary tract infection   Cardiac resynchronization therapy pacemaker (CRT-P) in place   Acute exacerbation of CHF (congestive heart failure) (HCC)   Suprapubic pain   Pressure ulcer of sacral region, stage 2 (HCC)   Biventricular failure [facility])    Brief Hospital Course:  Donna Herrera is a 78 y.o. female with history of HFrEF, Afib with CRT-P on eliquis , COPD, Pulmonary fibrosis, CAD  who was admitted for acute decompensated heart failure.  Acute Decompensated Heart Failure Transferred from PCP office on 10/22/2024 to ED for concern of orthopnea/bilateral lower extremity edema in setting of known congestive heart failure (LVEF of 20% in October 2025).  ACS workup unremarkable in the ED and breathing/satting well on room air. Patient was volume overloaded, concerning for decompensation of her heart failure. Cardiology consulted given persistent hypervolemia despite adherence to home regimen.  Home GDMT was continued, with the following adjustments: Losartan  12.5mg  once daily, continue metoprolol  succinate 25mg  daily, lasix  60 mg BID. Patient was experiencing some hypotension with medication, so midodrine  was increased prior to discharge. She was diuresed with IV diuretics and transitioned to 60mg  PO Lasix   BID and Midodrine  7.5mg  TID at time of discharge.  Suprapubic pain with hematuria  UTI Pain and hematuria as above x 1 day and UA suggestive of UTI, initiated on ceftriaxone  (12/25-27). CT abdomen pelvis significant for asymmetric perinephric stranding on the left kidney. 2.3 cm cyst in the upper pole of right kidney not requiring follow up. No hydronephrosis. Urinary bladder also distended. Urine culture showed pan sensitive ecoli. Patient was transitioned to cefadroxil  12/27-30.   Rectal bleeding  suspected internal hemorrhoid  One instance of blood streaked in stool during office visit prior to admission, no further episodes while admitted.  FOBT negative and rectal exam with likely internal hemorrhoid as source of bleed. CT abdomen pelvis is significant for total colonic diverticulosis but no further episodes of bleeding occurred.   Other chronic conditions were medically managed with home medications and formulary alternatives as necessary (COPD, Afib, CAD)  Follow-up recommendations Ensure cardiology follow-up with advanced heart failure team Severe osteopenia on CT, follow up outpatient.  Severe bony destruction of the right acetabulum with unchanged bony reabsorption of the proximal femur. 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 Diffuse osteopenia. Multilevel degenerative disc disease of the spine. Follow up with GI if patient has further rectal bleeding given diverticulosis. Last colonoscopy in 2021 without recommendation for repeat screening colonoscopy given patient's age.  Consider repeating BMP and CBC     Results/Tests Pending at Time of Discharge:  Unresulted Labs (From admission, onward)    None        Disposition: Home  Discharge Condition: Stable  Discharge Exam:  Vitals:   10/28/24 0726 10/28/24 1104  BP: 104/64 105/63  Pulse: 73 69  Resp: 13 14  Temp: 98.5 F (36.9 C) 98.6 F (37 C)  SpO2: 98% 98%   General:  Awake, alert, NAD. Communicates  clearly. Cardio: Irregular rhythm. Loud S1 andS2. No murmur, rub, gallop. 2+ radial and dorsalis pedis pulses b/l w/ good capillary refill. No LE edema appreciated. Resp: CTA bilaterally. No wheezes, rales, or rhonchi. Normal work of breathing on room air Abdomen: soft, non-tender, non-distended.  Neuro: Aox4. No focal deficits.   Significant Procedures: None  Significant Labs and Imaging:  No results for input(s): WBC, HGB, HCT, PLT in the last 48 hours. Recent Labs  Lab 10/27/24 0239 10/28/24 0231  NA 133* 131*  K 3.9 4.8  CL 94* 94*  CO2 31 26  GLUCOSE 88 90  BUN 13 19  CREATININE 0.62 0.91  CALCIUM  8.9 8.9  MG 2.2 2.2    CXR 12/23 Mild cardiomegaly with mild central vascular congestion.  CT AP w/ contrast 12/24 1. Asymmetric perinephric stranding about the left kidney. While this may be chronic, correlation with urinalysis is recommended to exclude an ascending urinary tract infection. No hydronephrosis or nephrolithiasis. 2. Total colonic diverticulosis. No changes of acute diverticulitis. 3. Small volume free fluid in the pelvis with diffuse anasarca, likely related to patient's volume status.    Discharge Medications:  Allergies as of 10/28/2024       Reactions   Crab [shellfish Allergy]    Itching to lips   Morphine And Codeine Itching, Swelling   Atorvastatin  Other (See Comments)   achiness   Rosuvastatin  Other (See Comments)   Muscle pain and achiness        Medication List     PAUSE taking these medications    potassium chloride  10 MEQ tablet Wait to take this until your doctor or other care provider tells you to start again. Commonly known as: KLOR-CON  Take 10 mEq by mouth daily.   potassium chloride  SA 20 MEQ tablet Wait to take this until your doctor or other care provider tells you to start again. Commonly known as: KLOR-CON  M Take 20 mEq by mouth daily.   spironolactone  25 MG tablet Wait to take this until your doctor or  other care provider tells you to start again. Commonly known as: Aldactone  Take 0.5 tablets (12.5 mg total) by mouth daily.       STOP taking these medications    metoprolol  tartrate 50 MG tablet Commonly known as: LOPRESSOR        TAKE these medications    acetaminophen  500 MG tablet Commonly known as: TYLENOL  Take 1,000 mg by mouth daily as needed for mild pain or moderate pain.   albuterol  108 (90 Base) MCG/ACT inhaler Commonly known as: VENTOLIN  HFA INHALE 1-2 PUFFS BY MOUTH EVERY 6 HOURS AS NEEDED FOR WHEEZE OR SHORTNESS OF BREATH   allopurinol  100 MG tablet Commonly known as: ZYLOPRIM  Take 1 tablet (100 mg total) by mouth daily.   cefadroxil  500 MG capsule Commonly known as: DURICEF Take 2 capsules (1,000 mg total) by mouth 2 (two) times daily.   colchicine  0.6 MG tablet TAKE 1 TABLET BY MOUTH EVERY DAY What changed:  when to take this reasons to take this   DULoxetine  30 MG capsule Commonly known as: CYMBALTA  Take 30 mg by mouth daily.   Eliquis  5 MG Tabs tablet Generic drug: apixaban  TAKE 1 TABLET BY MOUTH TWICE A DAY   ezetimibe  10 MG tablet Commonly known as: ZETIA  Take 1 tablet (10 mg total) by mouth daily.   famotidine  20 MG tablet Commonly known as: PEPCID  TAKE 1 TABLET (20 MG TOTAL) BY MOUTH DAILY AS NEEDED FOR  HEARTBURN OR INDIGESTION.   ferrous sulfate  325 (65 FE) MG tablet TAKE 1 TABLET BY MOUTH EVERY OTHER DAY What changed: when to take this   furosemide  20 MG tablet Commonly known as: LASIX  Take 3 tablets (60 mg total) by mouth 2 (two) times daily. What changed:  medication strength how much to take   gabapentin  300 MG capsule Commonly known as: NEURONTIN  TAKE 1 CAPSULE BY MOUTH AT BEDTIME AS NEEDED.   ICY HOT BACK EX Apply 1 Application topically 2 (two) times daily as needed (lower leg pain, knee pain).   losartan  25 MG tablet Commonly known as: Cozaar  Take 0.5 tablets (12.5 mg total) by mouth daily.   metoprolol   succinate 25 MG 24 hr tablet Commonly known as: TOPROL -XL Take 1 tablet (25 mg total) by mouth daily.   midodrine  2.5 MG tablet Commonly known as: PROAMATINE  Take 3 tablets (7.5 mg total) by mouth 3 (three) times daily with meals.   naloxone 4 MG/0.1ML Liqd nasal spray kit Commonly known as: NARCAN Place 1 spray into the nose once as needed (OD).   oxyCODONE  5 MG immediate release tablet Commonly known as: Oxy IR/ROXICODONE  Take 5 mg by mouth every 6 (six) hours as needed for moderate pain or severe pain.   Vitamin D  (Ergocalciferol ) 1.25 MG (50000 UNIT) Caps capsule Commonly known as: DRISDOL  Take 1 capsule (50,000 Units total) by mouth once a week. Take on Wednesday        Discharge Instructions: Please refer to Patient Instructions section of EMR for full details.  Patient was counseled important signs and symptoms that should prompt return to medical care, changes in medications, dietary instructions, activity restrictions, and follow up appointments.   Follow-Up Appointments:  Contact information for follow-up providers     [facility]. Go in 9 day(s).   Specialty: Cardiology Why: Hospital follow uo 11/04/2024 @ 1:30 pm PLEASE bring a current medication list to appointment FREE valet parking, Entrance C, off [address] and [facility] entrance Contact information: [address] [place]  [address] [phone]        Lonnie Earnest, MD Follow up.   Specialty: Family Medicine Contact information: [address] [place] [address] [phone]              Contact information for after-discharge care     Home Medical Care     [facility] [place] [facility]) .   Service: Home Health Services Why: Agency will call you to set up apt times Contact information: [address] [place]  [address] [phone]                      Leafy Scriver DO PGY-1, [facility] Health Family Medicine  "

## 2024-10-28 NOTE — Assessment & Plan Note (Addendum)
 Pan sensitive E coli on Ucx.  Cefadroxil  1g BID, last day of treatment 10/29/24

## 2024-10-28 NOTE — Progress Notes (Signed)
 "    Daily Progress Note Intern Pager: [phone]  Patient name: Donna Herrera Medical record number: [id number] Date of birth: 1946/06/30 Age: 78 y.o. Gender: female  Primary Care Provider: Lonnie Earnest, MD Consultants: Cardiology Code Status: DNR limited  Pt Overview and Major Events to Date:  12/23-admitted 12/24-cardiology consulted  Medical Decision Making:  78 yo f w/ hx of HFrEF EF < 20%, HTN, Afib, HLD, COPD, prediabetes, and pulmonary fibrosis admitted for CHF exacerbation. Now diuresed back to baseline, no remaining dyspnea at rest or with ambulation. Also found to have UTI, treated with CTX and then transitioned to cefadroxil , to be completed 12/30.  Assessment & Plan Acute exacerbation of CHF (congestive heart failure) (HCC) HFrEF (heart failure with reduced ejection fraction) (HCC) UOP last 24h s/p one dose of 60 PO lasix  in the pm and 40mg  iv in the AM. Today's weight is 140lbs down from 148lbs.  -Na 131 slightly downtrending last two days -- Cardiology consulted, appreciate recommendations -GDMT plan in anticipation of discharge:  -- Lasix  60mg  PO BID  -- Midodrine  7.5 mg 3 times daily due to persistently low BP -- metoprolol  25 mg once daily  -- 12.5 mg of losartan   -- Spironolactone  12.5 mg  -- Strict I's/O's - Daily weights -AM BMP if patient stays overnight Urinary tract infection Pan sensitive E coli on Ucx.  Cefadroxil  1g BID, last day of treatment 10/29/24 QT prolongation (Resolved: 10/28/2024) Noted after episode of chest pain on 12/24.  Continued to improve on EKG on 12/26 with qtc of 499.  --Avoid QT prolonging agents as able --Continue to monitor with telemetry Persistent atrial fibrillation (HCC) (Resolved: 10/28/2024) Cardiac pacemaker in situ (Resolved: 10/28/2024) Device interrogated on 12/26, functioning normally -- Continue home Eliquis  5 mg twice daily -- Avoid AV nodal blocking agents Chronic health problem Gout:  Continue home allopurinol  100 mg daily and colchicine  0.6 mg daily HLD: Continue home ezetimibe  10 mg daily Chronic pain syndrome: Continue home oxycodone  5 mg every 6 hours as needed Sacral pressure ulcer: wound care and frequent repositioning Neuropathy: Home gabapentin  300 mg nightly, home duloxetine  30 mg daily  FEN/GI: Heart healthy PPx: home eliquis  Dispo:Home today.   Subjective:  NAEON. Pt reports resolution of her dyspnea, no abdominal pain, and did lots of walking around and time out of bed yesterday. Feels ready for discharge today.   Objective: Temp:  [97.6 F (36.4 C)-98.5 F (36.9 C)] 98.5 F (36.9 C) (12/29 0726) Pulse Rate:  [70-75] 73 (12/29 0726) Resp:  [11-15] 13 (12/29 0726) BP: (88-109)/(49-73) 104/64 (12/29 0726) SpO2:  [95 %-98 %] 98 % (12/29 0726) Weight:  [63.6 kg] 63.6 kg (12/29 0300) Physical Exam: General: Awake, alert, NAD. Communicates clearly. Cardio: Irregular rhythm. Loud S1 andS2. No murmur, rub, gallop. 2+ radial and dorsalis pedis pulses b/l w/ good capillary refill. No LE edema appreciated. Resp: CTA bilaterally. No wheezes, rales, or rhonchi. Normal work of breathing on room air Abdomen: soft, non-tender, non-distended.  Neuro: Aox4. No focal deficits.   Laboratory: Most recent CBC Lab Results  Component Value Date   WBC 4.8 10/25/2024   HGB 12.4 10/25/2024   HCT 39.7 10/25/2024   MCV 90.4 10/25/2024   PLT 204 10/25/2024   Most recent BMP    Latest Ref Rng & Units 10/28/2024    2:31 AM  BMP  Glucose 70 - 99 mg/dL 90   BUN 8 - 23 mg/dL 19   Creatinine 9.55 - 1.00 mg/dL 9.08  Sodium 135 - 145 mmol/L 131   Potassium 3.5 - 5.1 mmol/L 4.8   Chloride 98 - 111 mmol/L 94   CO2 22 - 32 mmol/L 26   Calcium  8.9 - 10.3 mg/dL 8.9    Mg 2.2  Imaging/Diagnostic Tests: None  Manon Jester, DO 10/28/2024, 7:55 AM  PGY-1, [facility] Family Medicine FPTS Intern pager: [phone], text pages welcome Secure chat group [facility] Teaching Service   "

## 2024-10-28 NOTE — Assessment & Plan Note (Addendum)
 Device interrogated on 12/26, functioning normally -- Continue home Eliquis  5 mg twice daily -- Avoid AV nodal blocking agents

## 2024-10-28 NOTE — Progress Notes (Signed)
 "  Heart Failure Stewardship Pharmacist Progress Note   PCP: Lonnie Earnest, MD PCP-Cardiologist: Vina Gull, MD    HPI:  78 yo F with PMH of CHF, NICM, HTN, afib, GERD, HLD, HTN, COPD, and pulmonary fibrosis.   EF has fluctuated through the years, since at least 2016. Through 2016-2019, EF fluctuated from 15-40%. R/LHC in 2019 showed normal cors, mild precapillary PH and low output (RA 6, PAP 39/16, PCW 8, CO 3.95, CI 2.03, LVG EF 30-35%). Echo 4/24, EF back to normal 50-55% then down again to <20% in 2/25 when she underwent DCCV. Underwent AV node ablation 6/25.   Admitted 10/30-11/2 for acute on chronic CHF. ECHO 10/31 with EF <20%. Diuresed and discharged home on furosemide  40 mg BID, lopressor , and spironolactone . Losartan  was held on discharge. Weight 144 lbs. She was then seen by [facility] on 11/5 and she was referred to AHF clinic. She was then seen by her PCP on 11/7 and appeared volume overloaded (weight 146 lbs) so lasix  was increased for that evening. She was continued on lasix  80 AM and 40 PM on 11/11.   Presented to the ED on 12/23 following appt with PCP where she was volume overloaded and complaining of shortness of breath and orthopnea. Also had new onset hematuria and hematochezia. CXR with mild cardiomegaly and mild central vascular congestion.   Met with patient at bedside. Denies shortness of breath or chest pain. States she is still having lightheadedness and dizziness when up moving around the room. 1+ LE edema. BP has been low and requiring midodrine  7.5 mg TID. Patient is slow to respond and seems confused at times. Discharge orders have been placed. She states her granddaughter helps her fill her medication box for her and she usually has her family pick up medications from [facility].   Current HF Medications: Diuretic: furosemide  60 mg PO BID Beta Blocker: metoprolol  XL 25 mg daily ACE/ARB/ARNI: losartan  25 mg daily *also on midodrine  7.5 mg TID  Prior to admission HF  Medications: Diuretic: furosemide  80 mg every AM and 40 mg every PM Beta blocker: metoprolol  tartrate 50 mg BID MRA: spironolactone  12.5 mg daily *has been off losartan  for about a month  Pertinent Lab Values: Serum creatinine 0.91, BUN 19, Potassium 4.8, Sodium 131, proBNP 6767, Magnesium  2.2   Vital Signs: Weight: 140 lbs (admission weight: 160 lbs) Blood pressure: 100/60s  Heart rate: 70s  I/O: net -3L yesterday; net -12.6L since admission  Medication Assistance / Insurance Benefits Check: Does the patient have prescription insurance?  Yes Type of insurance plan: [facility] Medicare  Outpatient Pharmacy:  Prior to [facility] Is the patient willing to use [facility] TOC pharmacy at discharge? Yes Is the patient willing to transition their outpatient pharmacy to utilize a [facility] outpatient pharmacy?   Pending - her family picks up medications from the pharmacy for her. Would like to discuss with them first    Assessment: 1. Acute on chronic systolic CHF (LVEF <20%), due to NICM. NYHA class II symptoms. - Continue furosemide  60 mg BID. Strict I/Os and daily weights. Keep K>4 and Mg>2. - Continue metoprolol  XL 25 mg daily - Continue losartan  25 mg daily - Holding spironolactone  with symptomatic hypotension. K also 4.8 today. It has been ordered to continue on discharge. - Not a candidate for SGLT2i with history of frequent UTIs   Plan: 1) Medication changes recommended at this time: - Remove spironolactone  from discharge orders, still requiring high doses of midodrine  and  has episodes of lightheadedness when moving around the room  2) Patient assistance: - None pending - Her family assists her with filling medication pill boxes and picking up medications from the pharmacy  3)  Education  - Patient has been educated on current HF medications and potential additions to HF medication regimen - Patient verbalizes understanding that over the next few months, these  medication doses may change and more medications may be added to optimize HF regimen - Patient has been educated on basic disease state pathophysiology and goals of therapy   Duwaine Plant, PharmD, BCPS Heart Failure Stewardship Pharmacist Phone [phone]   "

## 2024-10-28 NOTE — TOC Transition Note (Signed)
 Transition of Care [facility]) - Discharge Note   Patient Details  Name: Donna Herrera MRN: [id number] Date of Birth: 08-07-1946  Transition of [facility]) CM/SW Contact:  Andrez JULIANNA George, RN Phone Number: 10/28/2024, 2:58 PM   Clinical Narrative:     Pt is discharging home with home health through [facility]. Information on the AVS. [facility] will contact her for the first home visit.  Pt has transportation home.  Final next level of care: Home w Home Health Services Barriers to Discharge: No Barriers Identified   Patient Goals and CMS Choice            Discharge Placement                       Discharge Plan and Services Additional resources added to the After Visit Summary for                                       Social Drivers of Health (SDOH) Interventions SDOH Screenings   Food Insecurity: No Food Insecurity (10/23/2024)  Housing: Low Risk (10/23/2024)  Transportation Needs: No Transportation Needs (10/25/2024)  Recent Concern: Transportation Needs - Unmet Transportation Needs (10/23/2024)  Utilities: Not At Risk (10/23/2024)  Alcohol Screen: Low Risk (10/25/2024)  Depression (PHQ2-9): High Risk (09/10/2024)  Financial Resource Strain: Low Risk (10/25/2024)  Physical Activity: Insufficiently Active (04/29/2024)  Social Connections: Moderately Isolated (10/23/2024)  Stress: No Stress Concern Present (04/29/2024)  Tobacco Use: Medium Risk (10/23/2024)  Health Literacy: Adequate Health Literacy (06/19/2024)     Readmission Risk Interventions    10/25/2024    3:15 PM  Readmission Risk Prevention Plan  Transportation Screening Complete  Home Care Screening Complete  Medication Review (RN CM) Complete

## 2024-11-01 ENCOUNTER — Telehealth (HOSPITAL_COMMUNITY): Payer: Self-pay

## 2024-11-01 NOTE — Telephone Encounter (Signed)
 Called to confirm/remind patient of their appointment at the [facility] on 11/04/24 1:30.   Appointment:   [] Confirmed  [x] Left mess   [] No answer/No voice mail  [] VM Full/unable to leave message  [] Phone not in service  Patient reminded to bring all medications and/or complete list.  Confirmed patient has transportation. Gave directions, instructed to utilize valet parking.

## 2024-11-02 ENCOUNTER — Ambulatory Visit: Payer: Self-pay | Admitting: Cardiology

## 2024-11-04 ENCOUNTER — Telehealth: Payer: Self-pay

## 2024-11-04 ENCOUNTER — Ambulatory Visit (HOSPITAL_COMMUNITY)
Admission: RE | Admit: 2024-11-04 | Discharge: 2024-11-04 | Disposition: A | Discharge status: Home / Self Care | Source: Ambulatory Visit | Attending: Cardiology | Admitting: Cardiology

## 2024-11-04 ENCOUNTER — Encounter (HOSPITAL_COMMUNITY): Payer: Self-pay

## 2024-11-04 VITALS — BP 148/72 | HR 68 | Wt 141.0 lb

## 2024-11-04 DIAGNOSIS — I4821 Permanent atrial fibrillation: Secondary | ICD-10-CM | POA: Diagnosis not present

## 2024-11-04 DIAGNOSIS — Z87891 Personal history of nicotine dependence: Secondary | ICD-10-CM | POA: Diagnosis not present

## 2024-11-04 DIAGNOSIS — Z8249 Family history of ischemic heart disease and other diseases of the circulatory system: Secondary | ICD-10-CM | POA: Diagnosis not present

## 2024-11-04 DIAGNOSIS — I959 Hypotension, unspecified: Secondary | ICD-10-CM | POA: Diagnosis not present

## 2024-11-04 DIAGNOSIS — Z7901 Long term (current) use of anticoagulants: Secondary | ICD-10-CM | POA: Insufficient documentation

## 2024-11-04 DIAGNOSIS — Z79899 Other long term (current) drug therapy: Secondary | ICD-10-CM | POA: Diagnosis not present

## 2024-11-04 DIAGNOSIS — I071 Rheumatic tricuspid insufficiency: Secondary | ICD-10-CM | POA: Diagnosis not present

## 2024-11-04 DIAGNOSIS — I2510 Atherosclerotic heart disease of native coronary artery without angina pectoris: Secondary | ICD-10-CM | POA: Insufficient documentation

## 2024-11-04 DIAGNOSIS — I428 Other cardiomyopathies: Secondary | ICD-10-CM | POA: Diagnosis not present

## 2024-11-04 DIAGNOSIS — I110 Hypertensive heart disease with heart failure: Secondary | ICD-10-CM | POA: Diagnosis present

## 2024-11-04 DIAGNOSIS — Z87440 Personal history of urinary (tract) infections: Secondary | ICD-10-CM | POA: Insufficient documentation

## 2024-11-04 DIAGNOSIS — I159 Secondary hypertension, unspecified: Secondary | ICD-10-CM

## 2024-11-04 DIAGNOSIS — J449 Chronic obstructive pulmonary disease, unspecified: Secondary | ICD-10-CM | POA: Diagnosis not present

## 2024-11-04 DIAGNOSIS — I5082 Biventricular heart failure: Secondary | ICD-10-CM | POA: Insufficient documentation

## 2024-11-04 DIAGNOSIS — Z5982 Transportation insecurity: Secondary | ICD-10-CM | POA: Diagnosis not present

## 2024-11-04 MED ORDER — MIDODRINE HCL 5 MG PO TABS: 5.0000 mg | ORAL_TABLET | Freq: Three times a day (TID) | ORAL | 3 refills | Status: DC

## 2024-11-04 NOTE — Progress Notes (Addendum)
 "    HEART & VASCULAR TRANSITION OF CARE CONSULT NOTE   Referring Physician: Dr. Elicia PCP: Lonnie Earnest, MD  Cardiologist: Vina Gull, MD  HPI: Referred to clinic by Dr. Zheng for heart failure consultation.   Donna Herrera is a 79 y.o. female with history of severe biventricular heart failure, Afib s/p AV node ablation and CRT-P, on eliquis , COPD, pulmonary fibrosis, and CAD.   She was admitted for ADHF 10/25. Echo showed EF <20% with D showed septum and severely reduced RV function, and severe TR. She was diuresed with IV Lasix  and restarted on GDMT at discharge. Course c/b AKI, that resolved with diuresis.   She was seen by [facility] after hospitalization and AHF referral was placed, however patient was then seen by PCP 10/22/24 and and transferred to ED for readmission for ADHF despite adhereance to medical regimen. She was diuresed and transitioned to Lasix  60 mg bid and midodrine  7.5 mg tid. Course complicated by UTI.  Today she presents for transition of care visit with granddaughter. Overall feeling well. NYHA III, limited by deconditioning. Denies chest pain, dyspnea, fatigue, orthopnea, palpitations, and dizziness. Able to perform ADLs, walking short distances with walker. Appetite okay. Does not have BP cuff at home. Compliant with all medications. Denies ETOH, tobacco, or drug use.   Past Medical History:  Diagnosis Date   Acute exacerbation of CHF (congestive heart failure) (HCC) 02/09/2024   Adenomatous polyp 03/23/2020   Colonoscopy 2021 notable for adenomatous polyp. Per GI, no further follow up colonoscopies recommended given patient's age.   Allergy    Arthritis    Atherosclerosis of aorta 07/15/2021   BRBPR (bright red blood per rectum) 10/22/2024   Candidiasis, mouth 10/19/2017   CHF exacerbation (HCC) 08/29/2024   Chronic combined systolic and diastolic CHF (congestive heart failure) (HCC) 10/21/2015   a. 12/2014 Echo: EF 40%; b. 10/2015 Echo: EF 25-40%;  c. 02/2018 Echo: EF 15-20%; d. 01/2023 Echo: EF 50-55%; e. 12/2023 Echo: EF 20-25%, glob HK, RVSP , mod BAE, triv MR, mod TR.   Chronic left shoulder pain 04/10/2013   Chronic lung disease    Chronic pain    Chronic pain syndrome 07/20/2017   Confusion 06/08/2021   Depression 08/25/2016   DJD (degenerative joint disease) 10/12/2012   Multiple joint replacements by Dr. Carlos.    Dyshidrotic eczema 12/31/2014   Edentulous 07/19/2021   Emphysema lung (HCC) 03/02/2021   Long smoking history.  Noted on CT Low Dose Chest: 01/2021: IMPRESSION: Chronic interstitial changes raise the question of pulmonary fibrosis. High-resolution chest CT may prove helpful to further evaluate as clinically warranted.Emphysema (ICD10-J43.9) and Aortic Atherosclerosis (ICD10-170.0)  Recommended pulmonology referral but patient declined. Recommend continued discussion and symptom monitor   Essential hypertension 07/20/2017   GERD (gastroesophageal reflux disease)    Gout    H/O slipped capital femoral epiphysis (SCFE)    Heart failure, left, with LVEF 41-49% (HCC) 10/21/2015   transthoracic echocardiogram 06/06/18: EF appears improved to 40-45% with diffuse hypokinesis  A. Echo 3/16: EF 40%, diffuse HK, mild MR, moderate LAE, mild RAE, PASP 42 mmHg  //  B. Echo 12/16: Mild LVH, EF 35-40%, diffuse HK, grade 2 diastolic dysfunction, MAC, mild MR, moderate LAE, normal RVSF, PASP 49 mmHg   History of total right hip replacement 04/02/2019   Followed closely with orthopedist Dr. Ernie. Patient is s/p total right hip replacement in 1980's with evidence of bone loss and osteopenia making reconstruction unlikely. Plan for conservative treatment at this  time with crutches for ambulation and pain control.   Hx of surgical fusion joint 12/23/2022   Hyperlipidemia    Hypertension    Hypertensive heart disease with CHF (congestive heart failure) (HCC) 10/12/2012   Loss of taste 02/06/2018   Mass of breast, left 07/21/2014    Mechanical failure of prosthetic joint, initial encounter 12/23/2022   NICM (nonischemic cardiomyopathy) (HCC) 06/09/2016   a. 04/2016 Cath: Normal coronary arteries, EF 30-35%, LVEDP 14 mmHg; b. 12/2014 Echo: EF 40%; c. 10/2015 Echo: EF 25-40%; d. 02/2018 Echo: EF 15-20%; e. 01/2023 Echo: EF 50-55%; f. 12/2023 Echo: EF 20-25%, glob HK, RVSP , mod BAE, triv MR, mod TR.   Pain in gums 04/18/2018   Persistent atrial fibrillation (HCC)    a. 12/2023 s/p TEE/DCCV - 200J-->tikosyn  loaded; b. 01/2024 ERAF->tikosyn  d/c'd.   Pre-diabetes    Pulmonary fibrosis (HCC) 03/02/2021   CT Low Dose Chest: 01/2021: IMPRESSION: Chronic interstitial changes raise the question of pulmonary fibrosis. High-resolution chest CT may prove helpful to further evaluate as clinically warranted.Emphysema (ICD10-J43.9) and Aortic Atherosclerosis (ICD10-170.0)  Recommended pulmonology referral but patient declined. Recommend continued discussion and symptom monitoring.   Pulmonary nodule 1 cm or greater in diameter 03/02/2021   CT low dose Chest: 01/2021: 1. Lung-RADS 4A, suspicious. 10 mm posterior left lower lobe pulmonary nodule, possibly scarring. Follow up low-dose chest CT without contrast in 3 months (please use the following order, CT CHEST LCS NODULE FOLLOW-UP W/O CM) is recommended. Alternatively, PET may be considered when there is a solid component 8mm or larger.  Plan: Follow up low dose CT ordered in 3 mon   Rib pain on left side 09/17/2020   Right hip pain 11/02/2012   Right upper quadrant pain 08/29/2024   Tendonitis, Achilles, left 05/24/2017   Tobacco abuse 09/29/2014   Urge incontinence 07/19/2021   Vitamin D  deficiency 12/31/2014    Current Outpatient Medications  Medication Sig Dispense Refill   acetaminophen  (TYLENOL ) 500 MG tablet Take 1,000 mg by mouth daily as needed for mild pain or moderate pain.     albuterol  (VENTOLIN  HFA) 108 (90 Base) MCG/ACT inhaler INHALE 1-2 PUFFS BY MOUTH EVERY 6 HOURS AS  NEEDED FOR WHEEZE OR SHORTNESS OF BREATH 8.5 each 3   allopurinol  (ZYLOPRIM ) 100 MG tablet Take 1 tablet (100 mg total) by mouth daily. 90 tablet 2   cefadroxil  (DURICEF) 500 MG capsule Take 2 capsules (1,000 mg total) by mouth 2 (two) times daily. 3 capsule 0   colchicine  0.6 MG tablet TAKE 1 TABLET BY MOUTH EVERY DAY 90 tablet 1   DULoxetine  (CYMBALTA ) 30 MG capsule Take 30 mg by mouth daily.     ELIQUIS  5 MG TABS tablet TAKE 1 TABLET BY MOUTH TWICE A DAY 60 tablet 2   ezetimibe  (ZETIA ) 10 MG tablet Take 1 tablet (10 mg total) by mouth daily. 90 tablet 3   famotidine  (PEPCID ) 20 MG tablet TAKE 1 TABLET (20 MG TOTAL) BY MOUTH DAILY AS NEEDED FOR HEARTBURN OR INDIGESTION. 90 tablet 1   ferrous sulfate  325 (65 FE) MG tablet TAKE 1 TABLET BY MOUTH EVERY OTHER DAY 45 tablet 1   furosemide  (LASIX ) 20 MG tablet Take 3 tablets (60 mg total) by mouth 2 (two) times daily. 180 tablet 0   gabapentin  (NEURONTIN ) 300 MG capsule TAKE 1 CAPSULE BY MOUTH AT BEDTIME AS NEEDED. 30 capsule 2   losartan  (COZAAR ) 25 MG tablet Take 0.5 tablets (12.5 mg total) by mouth daily. 15 tablet 0  Menthol , Topical Analgesic, (ICY HOT BACK EX) Apply 1 Application topically 2 (two) times daily as needed (lower leg pain, knee pain).     metoprolol  succinate (TOPROL -XL) 25 MG 24 hr tablet Take 1 tablet (25 mg total) by mouth daily. 30 tablet 0   midodrine  (PROAMATINE ) 2.5 MG tablet Take 3 tablets (7.5 mg total) by mouth 3 (three) times daily with meals. 270 tablet 0   naloxone (NARCAN) nasal spray 4 mg/0.1 mL Place 1 spray into the nose once as needed (OD).     oxyCODONE  (OXY IR/ROXICODONE ) 5 MG immediate release tablet Take 5 mg by mouth every 6 (six) hours as needed for moderate pain or severe pain.     Vitamin D , Ergocalciferol , (DRISDOL ) 1.25 MG (50000 UNIT) CAPS capsule Take 1 capsule (50,000 Units total) by mouth once a week. Take on Wednesday 5 capsule 0   [Paused] potassium chloride  (KLOR-CON ) 10 MEQ tablet Take 10 mEq by  mouth daily. (Patient not taking: Reported on 11/04/2024)     [Paused] potassium chloride  SA (KLOR-CON  M) 20 MEQ tablet Take 20 mEq by mouth daily. (Patient not taking: Reported on 11/04/2024)     [Paused] spironolactone  (ALDACTONE ) 25 MG tablet Take 0.5 tablets (12.5 mg total) by mouth daily. (Patient not taking: Reported on 11/04/2024) 30 tablet 0   No current facility-administered medications for this encounter.    Allergies[1]  Social History   Socioeconomic History   Marital status: Widowed    Spouse name: Not on file   Number of children: 3   Years of education: Not on file   Highest education level: High school graduate  Occupational History   Occupation: retired  Tobacco Use   Smoking status: Former    Current packs/day: 0.00    Average packs/day: 0.5 packs/day for 55.0 years (27.5 ttl pk-yrs)    Types: Cigarettes    Start date: 50    Quit date: 2017    Years since quitting: 9.0    Passive exposure: Past   Smokeless tobacco: Never   Tobacco comments:    Former smoker 01/22/24  Vaping Use   Vaping status: Never Used  Substance and Sexual Activity   Alcohol use: Not Currently   Drug use: No   Sexual activity: Not on file  Other Topics Concern   Not on file  Social History Narrative   Formerly daycare provider. Lives with, daughter Porter and grandkids.   Social Drivers of Health   Tobacco Use: Medium Risk (10/23/2024)   Patient History    Smoking Tobacco Use: Former    Smokeless Tobacco Use: Never    Passive Exposure: Past  Physicist, Medical Strain: Low Risk (10/25/2024)   Overall Financial Resource Strain (CARDIA)    Difficulty of Paying Living Expenses: Not very hard  Food Insecurity: No Food Insecurity (10/23/2024)   Epic    Worried About Programme Researcher, Broadcasting/film/video in the Last Year: Never true    Ran Out of Food in the Last Year: Never true  Transportation Needs: No Transportation Needs (10/25/2024)   Epic    Lack of Transportation (Medical): No    Lack of  Transportation (Non-Medical): No  Recent Concern: Transportation Needs - Unmet Transportation Needs (10/23/2024)   Epic    Lack of Transportation (Medical): Yes    Lack of Transportation (Non-Medical): Yes  Physical Activity: Insufficiently Active (04/29/2024)   Exercise Vital Sign    Days of Exercise per Week: 3 days    Minutes of Exercise per Session: 30  min  Stress: No Stress Concern Present (04/29/2024)   [employer] of Occupational Health - Occupational Stress Questionnaire    Feeling of Stress: Only a little  Social Connections: Moderately Isolated (10/23/2024)   Social Connection and Isolation Panel    Frequency of Communication with Friends and Family: More than three times a week    Frequency of Social Gatherings with Friends and Family: More than three times a week    Attends Religious Services: 1 to 4 times per year    Active Member of [employer] or Organizations: No    Attends Banker Meetings: Never    Marital Status: Widowed  Intimate Partner Violence: Not At Risk (10/23/2024)   Epic    Fear of Current or Ex-Partner: No    Emotionally Abused: No    Physically Abused: No    Sexually Abused: No  Depression (PHQ2-9): High Risk (09/10/2024)   Depression (PHQ2-9)    PHQ-2 Score: 20  Alcohol Screen: Low Risk (10/25/2024)   Alcohol Screen    Last Alcohol Screening Score (AUDIT): 1  Housing: Low Risk (10/23/2024)   Epic    Unable to Pay for Housing in the Last Year: No    Number of Times Moved in the Last Year: 0    Homeless in the Last Year: No  Utilities: Not At Risk (10/23/2024)   Epic    Threatened with loss of utilities: No  Health Literacy: Adequate Health Literacy (06/19/2024)   B1300 Health Literacy    Frequency of need for help with medical instructions: Rarely    Family History  Problem Relation Age of Onset   Depression Mother    Diabetes Mother    Hypertension Mother    Stroke Mother    Heart disease Mother    Cancer Father         unknown primary   Diabetes Sister    Stomach cancer Brother    Throat cancer Son    Breast cancer Neg Hx    Esophageal cancer Neg Hx    Colon cancer Neg Hx    Colon polyps Neg Hx    Vitals:   11/04/24 1415  BP: (!) 148/72  Pulse: 68  SpO2: 98%  Weight: 64 kg (141 lb)   Filed Weights   11/04/24 1415  Weight: 64 kg (141 lb)    PHYSICAL EXAM: General: Elderly, frail appearing. No distress  Cardiac: JVP flat. No murmurs  Extremities: Warm and dry.  Trace BLE edema.  Neuro: A&O x3. Affect flat  ASSESSMENT & PLAN:  Chronic Severe Biventricular HFrEF, NICM - cath in 7/21 with normal cors and LV dysfunction 30-35% with normal right sided filling pressures - s/p St Jude CRT-P 4/25 - most recent echo 10/25 with EF <20%, D-shaped septum with severely reduced RV function, and severe TR - NYHA III, limited mainly by deconditioning. Appears euvolemic - continue lasix  60 mg bid. BMET, BNP today - GDMT: ? blocker: continue toprol  XL 25 mg daily ARB/ARNI: stop losartan  MRA: off spiro with hyperK SGLT2i: contraindicated with recent UTI - with advanced age and RV dysfunction, she is not a candidate for advanced therapies; medical optimization with GDMT  Hypotension, Hypertension - BP elevated in visit today - no lightheadedness/dizziness - stop losartan ; decrease midodrine  to 5 mg tid; would like to eventually come off  Permanent Afib - s/p AV nod ablation and CRT-P - enrolled in iCM monitoring  Referred to HFSW (PCP, Medications, Transportation, ETOH Abuse, Drug Abuse, Insurance, Financial): No  Refer to Pharmacy: No Refer to Home Health: No Refer to Advanced Heart Failure Clinic: No  Refer to General Cardiology: No, established. Needs sooner follow up with [facility].  Will follow up 1 more time with TOC for medication titration, would like to come down/off of midodrine . Will reach out to Dr. Okey office for sooner follow up post hospitalization.   Calais Svehla, NP 11/04/2024      [1]  Allergies Allergen Reactions   Crab [Shellfish Allergy]     Itching to lips   Morphine And Codeine Itching and Swelling   Atorvastatin  Other (See Comments)    achiness   Rosuvastatin  Other (See Comments)    Muscle pain and achiness   "

## 2024-11-04 NOTE — Telephone Encounter (Signed)
 Maria from [facility] calling for PT verbal orders as follows:  1 time(s) weekly for 1 week(s), then 2 time(s) weekly for 8 week(s), then home health aide one time per week for seven weeks.   Verbal orders given per [facility] protocol  Chiquita JAYSON English, RN

## 2024-11-04 NOTE — Patient Instructions (Addendum)
 Medication Changes:  STOP LOSARTAN    DECREASE MIDODRINE  5MG  THREE TIMES DAILY   Lab Work:  Labs done today, your results will be available in MyChart, we will contact you for abnormal readings.  Follow-Up in: 2 week with [facility] clinic here at our office   PLEASE FOLLOW UP WITH DR. NADA OFFICE WITH HEART CARE-- MESSAGE SENT OVER THERE TO THEM THE NUMBER IS [phone]  At the [facility], you and your health needs are our priority. We have a designated team specialized in the treatment of Heart Failure. This Care Team includes your primary Heart Failure Specialized Cardiologist (physician), Advanced Practice Providers (APPs- Physician Assistants and Nurse Practitioners), and Pharmacist who all work together to provide you with the care you need, when you need it.   You may see any of the following providers on your designated Care Team at your next follow up:  Dr. Toribio Fuel Dr. Ezra Shuck Dr. Odis Brownie Greig Mosses, NP Caffie Shed, [place] [facility] [place], [place] Beckey Coe, NP Jordan Lee, NP Tinnie Redman, PharmD   Please be sure to bring in all your medications bottles to every appointment.   Need to Contact Us :  If you have any questions or concerns before your next appointment please send us  a message through [place] or call our office at [phone].    TO LEAVE A MESSAGE FOR THE NURSE SELECT OPTION 2, PLEASE LEAVE A MESSAGE INCLUDING: YOUR NAME DATE OF BIRTH CALL BACK NUMBER REASON FOR CALL**this is important as we prioritize the call backs  YOU WILL RECEIVE A CALL BACK THE SAME DAY AS LONG AS YOU CALL BEFORE 4:00 PM

## 2024-11-05 ENCOUNTER — Inpatient Hospital Stay: Payer: Self-pay | Admitting: Family Medicine

## 2024-11-05 ENCOUNTER — Telehealth (HOSPITAL_COMMUNITY): Payer: Self-pay

## 2024-11-05 NOTE — Telephone Encounter (Signed)
 Called patient to reschedule lab appt no answer unable to leave voicemail, was unable to collect specimen yesterday, advised patient we will call to reschedule and she was understanding

## 2024-11-18 ENCOUNTER — Telehealth (HOSPITAL_COMMUNITY): Payer: Self-pay

## 2024-11-18 NOTE — Telephone Encounter (Signed)
 Called to confirm/remind patient of their appointment at the [facility] on 11/19/24 1:30.   Appointment:   [x] Confirmed  [] Left mess   [] No answer/No voice mail  [] VM Full/unable to leave message  [] Phone not in service  Patient reminded to bring all medications and/or complete list.  Confirmed patient has transportation. Gave directions, instructed to utilize valet parking.

## 2024-11-18 NOTE — Progress Notes (Signed)
 "    HEART & VASCULAR TRANSITION OF CARE NOTE    PCP: Lonnie Earnest, MD  Cardiologist: Vina Gull, MD  HPI:  Donna Herrera is a 79 y.o. female with history of severe biventricular heart failure, Afib s/p AV node ablation and CRT-P, on eliquis , COPD, pulmonary fibrosis, and CAD.   She was admitted for ADHF 10/25. Echo showed EF <20% with D showed septum and severely reduced RV function, and severe TR. She was diuresed with IV Lasix  and restarted on GDMT at discharge. Course c/b AKI, that resolved with diuresis.   She was seen by [facility] after hospitalization and AHF referral was placed, however patient was then seen by PCP 10/22/24 and and transferred to ED for readmission for ADHF despite adherence to medical regimen. She was diuresed and transitioned to Lasix  60 mg bid and midodrine  7.5 mg tid. Course complicated by UTI.  Seen in [facility] clinic 01/05. Volume looked good. Midodrine  reduced and losartan  stopped until able to come off medication for BP support.  Here today for CHF visit. She is accompanied by her sister in law who assists with the history. No shortness of breath, orthopnea or PND. Legs tend to swell towards end of the day. She sits with her feet down most of the day. Has trouble elevating her legs while seated, joints are very stiff from prior orthopedic surgeries. Does not wear compression stockings. Weighs occasionally, often in 140s. Ambulates with a walker and is getting home PT. Lives with daughter and granddaughter and has an engineer, production. Reports compliance with medications, granddaughter manages them for her.   Denies ETOH, tobacco, or drug use.   Past Medical History:  Diagnosis Date   Acute exacerbation of CHF (congestive heart failure) (HCC) 02/09/2024   Adenomatous polyp 03/23/2020   Colonoscopy 2021 notable for adenomatous polyp. Per GI, no further follow up colonoscopies recommended given patient's age.   Allergy    Arthritis    Atherosclerosis of aorta  07/15/2021   BRBPR (bright red blood per rectum) 10/22/2024   Candidiasis, mouth 10/19/2017   CHF exacerbation (HCC) 08/29/2024   Chronic combined systolic and diastolic CHF (congestive heart failure) (HCC) 10/21/2015   a. 12/2014 Echo: EF 40%; b. 10/2015 Echo: EF 25-40%; c. 02/2018 Echo: EF 15-20%; d. 01/2023 Echo: EF 50-55%; e. 12/2023 Echo: EF 20-25%, glob HK, RVSP , mod BAE, triv MR, mod TR.   Chronic left shoulder pain 04/10/2013   Chronic lung disease    Chronic pain    Chronic pain syndrome 07/20/2017   Confusion 06/08/2021   Depression 08/25/2016   DJD (degenerative joint disease) 10/12/2012   Multiple joint replacements by Dr. Carlos.    Dyshidrotic eczema 12/31/2014   Edentulous 07/19/2021   Emphysema lung (HCC) 03/02/2021   Long smoking history.  Noted on CT Low Dose Chest: 01/2021: IMPRESSION: Chronic interstitial changes raise the question of pulmonary fibrosis. High-resolution chest CT may prove helpful to further evaluate as clinically warranted.Emphysema (ICD10-J43.9) and Aortic Atherosclerosis (ICD10-170.0)  Recommended pulmonology referral but patient declined. Recommend continued discussion and symptom monitor   Essential hypertension 07/20/2017   GERD (gastroesophageal reflux disease)    Gout    H/O slipped capital femoral epiphysis (SCFE)    Heart failure, left, with LVEF 41-49% (HCC) 10/21/2015   transthoracic echocardiogram 06/06/18: EF appears improved to 40-45% with diffuse hypokinesis  A. Echo 3/16: EF 40%, diffuse HK, mild MR, moderate LAE, mild RAE, PASP 42 mmHg  //  B. Echo 12/16: Mild LVH, EF 35-40%, diffuse  HK, grade 2 diastolic dysfunction, MAC, mild MR, moderate LAE, normal RVSF, PASP 49 mmHg   History of total right hip replacement 04/02/2019   Followed closely with orthopedist Dr. Ernie. Patient is s/p total right hip replacement in 1980's with evidence of bone loss and osteopenia making reconstruction unlikely. Plan for conservative treatment at this  time with crutches for ambulation and pain control.   Hx of surgical fusion joint 12/23/2022   Hyperlipidemia    Hypertension    Hypertensive heart disease with CHF (congestive heart failure) (HCC) 10/12/2012   Loss of taste 02/06/2018   Mass of breast, left 07/21/2014   Mechanical failure of prosthetic joint, initial encounter 12/23/2022   NICM (nonischemic cardiomyopathy) (HCC) 06/09/2016   a. 04/2016 Cath: Normal coronary arteries, EF 30-35%, LVEDP 14 mmHg; b. 12/2014 Echo: EF 40%; c. 10/2015 Echo: EF 25-40%; d. 02/2018 Echo: EF 15-20%; e. 01/2023 Echo: EF 50-55%; f. 12/2023 Echo: EF 20-25%, glob HK, RVSP , mod BAE, triv MR, mod TR.   Pain in gums 04/18/2018   Persistent atrial fibrillation (HCC)    a. 12/2023 s/p TEE/DCCV - 200J-->tikosyn  loaded; b. 01/2024 ERAF->tikosyn  d/c'd.   Pre-diabetes    Pulmonary fibrosis (HCC) 03/02/2021   CT Low Dose Chest: 01/2021: IMPRESSION: Chronic interstitial changes raise the question of pulmonary fibrosis. High-resolution chest CT may prove helpful to further evaluate as clinically warranted.Emphysema (ICD10-J43.9) and Aortic Atherosclerosis (ICD10-170.0)  Recommended pulmonology referral but patient declined. Recommend continued discussion and symptom monitoring.   Pulmonary nodule 1 cm or greater in diameter 03/02/2021   CT low dose Chest: 01/2021: 1. Lung-RADS 4A, suspicious. 10 mm posterior left lower lobe pulmonary nodule, possibly scarring. Follow up low-dose chest CT without contrast in 3 months (please use the following order, CT CHEST LCS NODULE FOLLOW-UP W/O CM) is recommended. Alternatively, PET may be considered when there is a solid component 8mm or larger.  Plan: Follow up low dose CT ordered in 3 mon   Rib pain on left side 09/17/2020   Right hip pain 11/02/2012   Right upper quadrant pain 08/29/2024   Tendonitis, Achilles, left 05/24/2017   Tobacco abuse 09/29/2014   Urge incontinence 07/19/2021   Vitamin D  deficiency 12/31/2014     Current Outpatient Medications  Medication Sig Dispense Refill   acetaminophen  (TYLENOL ) 500 MG tablet Take 1,000 mg by mouth daily as needed for mild pain or moderate pain.     albuterol  (VENTOLIN  HFA) 108 (90 Base) MCG/ACT inhaler INHALE 1-2 PUFFS BY MOUTH EVERY 6 HOURS AS NEEDED FOR WHEEZE OR SHORTNESS OF BREATH 8.5 each 3   allopurinol  (ZYLOPRIM ) 100 MG tablet Take 1 tablet (100 mg total) by mouth daily. 90 tablet 2   cefadroxil  (DURICEF) 500 MG capsule Take 2 capsules (1,000 mg total) by mouth 2 (two) times daily. 3 capsule 0   colchicine  0.6 MG tablet TAKE 1 TABLET BY MOUTH EVERY DAY 90 tablet 1   DULoxetine  (CYMBALTA ) 30 MG capsule Take 30 mg by mouth daily.     ELIQUIS  5 MG TABS tablet TAKE 1 TABLET BY MOUTH TWICE A DAY 60 tablet 2   ezetimibe  (ZETIA ) 10 MG tablet Take 1 tablet (10 mg total) by mouth daily. 90 tablet 3   famotidine  (PEPCID ) 20 MG tablet TAKE 1 TABLET (20 MG TOTAL) BY MOUTH DAILY AS NEEDED FOR HEARTBURN OR INDIGESTION. 90 tablet 1   ferrous sulfate  325 (65 FE) MG tablet TAKE 1 TABLET BY MOUTH EVERY OTHER DAY 45 tablet 1   furosemide  (  LASIX ) 20 MG tablet Take 3 tablets (60 mg total) by mouth 2 (two) times daily. 180 tablet 0   gabapentin  (NEURONTIN ) 300 MG capsule TAKE 1 CAPSULE BY MOUTH AT BEDTIME AS NEEDED. 30 capsule 2   Menthol , Topical Analgesic, (ICY HOT BACK EX) Apply 1 Application topically 2 (two) times daily as needed (lower leg pain, knee pain).     metoprolol  succinate (TOPROL -XL) 25 MG 24 hr tablet Take 1 tablet (25 mg total) by mouth daily. 30 tablet 0   naloxone (NARCAN) nasal spray 4 mg/0.1 mL Place 1 spray into the nose once as needed (OD).     oxyCODONE  (OXY IR/ROXICODONE ) 5 MG immediate release tablet Take 5 mg by mouth every 6 (six) hours as needed for moderate pain or severe pain.     Vitamin D , Ergocalciferol , (DRISDOL ) 1.25 MG (50000 UNIT) CAPS capsule Take 1 capsule (50,000 Units total) by mouth once a week. Take on Wednesday 5 capsule 0   No  current facility-administered medications for this encounter.    Allergies[1]  Social History   Socioeconomic History   Marital status: Widowed    Spouse name: Not on file   Number of children: 3   Years of education: Not on file   Highest education level: High school graduate  Occupational History   Occupation: retired  Tobacco Use   Smoking status: Former    Current packs/day: 0.00    Average packs/day: 0.5 packs/day for 55.0 years (27.5 ttl pk-yrs)    Types: Cigarettes    Start date: 67    Quit date: 2017    Years since quitting: 9.0    Passive exposure: Past   Smokeless tobacco: Never   Tobacco comments:    Former smoker 01/22/24  Vaping Use   Vaping status: Never Used  Substance and Sexual Activity   Alcohol use: Not Currently   Drug use: No   Sexual activity: Not on file  Other Topics Concern   Not on file  Social History Narrative   Formerly daycare provider. Lives with, daughter Porter and grandkids.   Social Drivers of Health   Tobacco Use: Medium Risk (11/19/2024)   Patient History    Smoking Tobacco Use: Former    Smokeless Tobacco Use: Never    Passive Exposure: Past  Physicist, Medical Strain: Low Risk (10/25/2024)   Overall Financial Resource Strain (CARDIA)    Difficulty of Paying Living Expenses: Not very hard  Food Insecurity: No Food Insecurity (10/23/2024)   Epic    Worried About Programme Researcher, Broadcasting/film/video in the Last Year: Never true    Ran Out of Food in the Last Year: Never true  Transportation Needs: No Transportation Needs (10/25/2024)   Epic    Lack of Transportation (Medical): No    Lack of Transportation (Non-Medical): No  Recent Concern: Transportation Needs - Unmet Transportation Needs (10/23/2024)   Epic    Lack of Transportation (Medical): Yes    Lack of Transportation (Non-Medical): Yes  Physical Activity: Insufficiently Active (04/29/2024)   Exercise Vital Sign    Days of Exercise per Week: 3 days    Minutes of Exercise per  Session: 30 min  Stress: No Stress Concern Present (04/29/2024)   [employer] of Occupational Health - Occupational Stress Questionnaire    Feeling of Stress: Only a little  Social Connections: Moderately Isolated (10/23/2024)   Social Connection and Isolation Panel    Frequency of Communication with Friends and Family: More than three times a week  Frequency of Social Gatherings with Friends and Family: More than three times a week    Attends Religious Services: 1 to 4 times per year    Active Member of [employer] or Organizations: No    Attends Banker Meetings: Never    Marital Status: Widowed  Intimate Partner Violence: Not At Risk (10/23/2024)   Epic    Fear of Current or Ex-Partner: No    Emotionally Abused: No    Physically Abused: No    Sexually Abused: No  Depression (PHQ2-9): High Risk (09/10/2024)   Depression (PHQ2-9)    PHQ-2 Score: 20  Alcohol Screen: Low Risk (10/25/2024)   Alcohol Screen    Last Alcohol Screening Score (AUDIT): 1  Housing: Low Risk (10/23/2024)   Epic    Unable to Pay for Housing in the Last Year: No    Number of Times Moved in the Last Year: 0    Homeless in the Last Year: No  Utilities: Not At Risk (10/23/2024)   Epic    Threatened with loss of utilities: No  Health Literacy: Adequate Health Literacy (06/19/2024)   B1300 Health Literacy    Frequency of need for help with medical instructions: Rarely    Family History  Problem Relation Age of Onset   Depression Mother    Diabetes Mother    Hypertension Mother    Stroke Mother    Heart disease Mother    Cancer Father        unknown primary   Diabetes Sister    Stomach cancer Brother    Throat cancer Son    Breast cancer Neg Hx    Esophageal cancer Neg Hx    Colon cancer Neg Hx    Colon polyps Neg Hx    Vitals:   11/19/24 1401  BP: 114/80  Pulse: 78  SpO2: 96%  Weight: 68 kg (150 lb)    Filed Weights   11/19/24 1401  Weight: 68 kg (150 lb)     PHYSICAL  EXAM: General:  Frail elderly female. Arrived in wheelchair. Neck: JVP midneck Cor: Regular rate & rhythm. No murmurs. Lungs: clear Abdomen: soft, nontender, nondistended. Extremities: 1+ edema Neuro: alert & orientedx3. Affect flat  Abbot interrogation: 83% BiV pacing since 12/25, CorVue thoracic impedance up and down, below threshold but trending back up  ASSESSMENT & PLAN:  Chronic Severe Biventricular HFrEF, NICM - cath in 7/21 with normal cors and LV dysfunction 30-35% with normal right sided filling pressures - s/p St Jude CRT-P 4/25 - most recent echo 10/25 with EF <20%, D-shaped septum with severely reduced RV function, and severe TR - NYHA III, limited by deconditioning and orthopedic issues.  - Volume looks okay. JVP to midneck but this is likely her normal with RV dysfunction. Suspect significant component of lower extremity edema is due to venous stasis. Given Rx for compression stockings.  - Continue lasix  60 mg BID. BMET/proBNP today. - GDMT: ? blocker: continue toprol  XL 25 mg daily ARB/ARNI: consider low dose at f/u MRA: off spiro with hyperK SGLT2i: contraindicated with recent UTI, can consider adding back in future if no recurrent infections - Stop midodrine . If BP stable at f/u, consider addition of low dose losartan   - with advanced age and RV dysfunction, she is not a candidate for advanced therapies; continue medical optimization with GDMT. 83% BiV pacing on device, follow closely. If pacing further declines, would have EP evaluation for device optimization.  Hypotension, Hypertension - BP at goal -  Stop midodrine  - Additional management as above  Permanent Afib - s/p AV nod ablation and CRT-P - enrolled in iCM monitoring   Follow-up: PRN, will contact Dr. Nada office for sooner follow-up than 03/09. Ideally titrate GDMT in next couple of weeks if tolerates coming off midodrine .  Alanea Woolridge N, PA-C 11/19/24     [1]  Allergies Allergen  Reactions   Crab [Shellfish Allergy]     Itching to lips   Morphine And Codeine Itching and Swelling   Atorvastatin  Other (See Comments)    achiness   Rosuvastatin  Other (See Comments)    Muscle pain and achiness   "

## 2024-11-19 ENCOUNTER — Encounter (HOSPITAL_COMMUNITY): Payer: Self-pay

## 2024-11-19 ENCOUNTER — Ambulatory Visit (HOSPITAL_COMMUNITY)
Admission: RE | Admit: 2024-11-19 | Discharge: 2024-11-19 | Disposition: A | Discharge status: Home / Self Care | Source: Ambulatory Visit | Attending: Physician Assistant | Admitting: Physician Assistant

## 2024-11-19 ENCOUNTER — Ambulatory Visit (HOSPITAL_COMMUNITY): Payer: Self-pay | Admitting: Physician Assistant

## 2024-11-19 VITALS — BP 114/80 | HR 78 | Wt 150.0 lb

## 2024-11-19 DIAGNOSIS — R600 Localized edema: Secondary | ICD-10-CM | POA: Diagnosis not present

## 2024-11-19 DIAGNOSIS — I5020 Unspecified systolic (congestive) heart failure: Secondary | ICD-10-CM

## 2024-11-19 DIAGNOSIS — Z79899 Other long term (current) drug therapy: Secondary | ICD-10-CM | POA: Insufficient documentation

## 2024-11-19 DIAGNOSIS — Z7901 Long term (current) use of anticoagulants: Secondary | ICD-10-CM | POA: Insufficient documentation

## 2024-11-19 DIAGNOSIS — I428 Other cardiomyopathies: Secondary | ICD-10-CM

## 2024-11-19 DIAGNOSIS — I2510 Atherosclerotic heart disease of native coronary artery without angina pectoris: Secondary | ICD-10-CM | POA: Insufficient documentation

## 2024-11-19 DIAGNOSIS — I5022 Chronic systolic (congestive) heart failure: Secondary | ICD-10-CM | POA: Insufficient documentation

## 2024-11-19 DIAGNOSIS — I959 Hypotension, unspecified: Secondary | ICD-10-CM | POA: Diagnosis not present

## 2024-11-19 DIAGNOSIS — I110 Hypertensive heart disease with heart failure: Secondary | ICD-10-CM | POA: Insufficient documentation

## 2024-11-19 DIAGNOSIS — Z87891 Personal history of nicotine dependence: Secondary | ICD-10-CM | POA: Insufficient documentation

## 2024-11-19 DIAGNOSIS — Z950 Presence of cardiac pacemaker: Secondary | ICD-10-CM | POA: Insufficient documentation

## 2024-11-19 DIAGNOSIS — I4821 Permanent atrial fibrillation: Secondary | ICD-10-CM | POA: Insufficient documentation

## 2024-11-19 DIAGNOSIS — I071 Rheumatic tricuspid insufficiency: Secondary | ICD-10-CM | POA: Insufficient documentation

## 2024-11-19 DIAGNOSIS — J449 Chronic obstructive pulmonary disease, unspecified: Secondary | ICD-10-CM | POA: Diagnosis not present

## 2024-11-19 DIAGNOSIS — I5082 Biventricular heart failure: Secondary | ICD-10-CM | POA: Diagnosis not present

## 2024-11-19 LAB — BASIC METABOLIC PANEL WITH GFR
Anion gap: 12 (ref 5–15)
BUN: 18 mg/dL (ref 8–23)
CO2: 28 mmol/L (ref 22–32)
Calcium: 9.4 mg/dL (ref 8.9–10.3)
Chloride: 95 mmol/L — ABNORMAL LOW (ref 98–111)
Creatinine, Ser: 0.75 mg/dL (ref 0.44–1.00)
GFR, Estimated: >60 mL/min
Glucose, Bld: 98 mg/dL (ref 70–99)
Potassium: 3.1 mmol/L — ABNORMAL LOW (ref 3.5–5.1)
Sodium: 135 mmol/L (ref 135–145)

## 2024-11-19 LAB — PRO BRAIN NATRIURETIC PEPTIDE: Pro Brain Natriuretic Peptide: 5848 pg/mL — ABNORMAL HIGH

## 2024-11-19 MED ORDER — POTASSIUM CHLORIDE CRYS ER 20 MEQ PO TBCR: 20.0000 meq | EXTENDED_RELEASE_TABLET | Freq: Every day | ORAL | 3 refills | Status: DC

## 2024-11-19 NOTE — Patient Instructions (Signed)
 Medication Changes:  STOP Midodrine   Lab Work:  Labs done today, your results will be available in MyChart, we will contact you for abnormal readings.   Special Instructions // Education:  Do the following things EVERYDAY: Weigh yourself in the morning before breakfast. Write it down and keep it in a log. Take your medicines as prescribed Eat low salt foods--Limit salt (sodium) to 2000 mg per day.  Stay as active as you can everyday Limit all fluids for the day to less than 2 liters   Please wear your compression hose daily, place them on as soon as you get up in the morning and remove before you go to bed at night.   Follow-Up in: Thank you for allowing us  to provider your heart failure care after your recent hospitalization. Please follow-up with Dr Okey' office, they will call you to schedule an appointment

## 2024-11-20 MED ORDER — POTASSIUM CHLORIDE CRYS ER 10 MEQ PO TBCR: 20.0000 meq | EXTENDED_RELEASE_TABLET | Freq: Every day | ORAL | 6 refills | Status: AC

## 2024-11-20 NOTE — Addendum Note (Signed)
 Addended by: JERONA DALTON HERO on: 11/20/2024 01:59 PM   Modules accepted: Orders

## 2024-11-24 ENCOUNTER — Other Ambulatory Visit: Payer: Self-pay | Admitting: Family Medicine

## 2024-11-24 DIAGNOSIS — I4891 Unspecified atrial fibrillation: Secondary | ICD-10-CM

## 2024-11-28 ENCOUNTER — Other Ambulatory Visit (HOSPITAL_COMMUNITY): Payer: Self-pay

## 2024-11-28 ENCOUNTER — Other Ambulatory Visit (HOSPITAL_COMMUNITY): Payer: Self-pay | Admitting: Cardiology

## 2024-11-28 MED ORDER — METOPROLOL SUCCINATE ER 25 MG PO TB24: 25.0000 mg | ORAL_TABLET | Freq: Every day | ORAL | 3 refills | Status: AC

## 2024-12-10 ENCOUNTER — Ambulatory Visit: Payer: Self-pay | Admitting: Family Medicine

## 2024-12-11 ENCOUNTER — Ambulatory Visit: Admitting: Physician Assistant

## 2025-01-01 ENCOUNTER — Encounter

## 2025-01-06 ENCOUNTER — Ambulatory Visit: Admitting: Physician Assistant

## 2025-04-02 ENCOUNTER — Encounter

## 2025-05-01 ENCOUNTER — Encounter

## 2025-07-02 ENCOUNTER — Encounter

## 2025-10-01 ENCOUNTER — Encounter

## 2025-12-31 ENCOUNTER — Encounter
